# Patient Record
Sex: Male | Born: 1965 | Race: Black or African American | Hispanic: No
Health system: Southern US, Community
[De-identification: ages and names within clinical notes are randomized; demographics above are authoritative.]

## PROBLEM LIST (undated history)

## (undated) DIAGNOSIS — G40909 Epilepsy, unspecified, not intractable, without status epilepticus: Secondary | ICD-10-CM

## (undated) DIAGNOSIS — Z720 Tobacco use: Secondary | ICD-10-CM

## (undated) DIAGNOSIS — D649 Anemia, unspecified: Secondary | ICD-10-CM

## (undated) DIAGNOSIS — F1290 Cannabis use, unspecified, uncomplicated: Secondary | ICD-10-CM

## (undated) DIAGNOSIS — J449 Chronic obstructive pulmonary disease, unspecified: Secondary | ICD-10-CM

## (undated) DIAGNOSIS — F1010 Alcohol abuse, uncomplicated: Secondary | ICD-10-CM

## (undated) DIAGNOSIS — J45909 Unspecified asthma, uncomplicated: Secondary | ICD-10-CM

## (undated) DIAGNOSIS — I69351 Hemiplegia and hemiparesis following cerebral infarction affecting right dominant side: Secondary | ICD-10-CM

## (undated) DIAGNOSIS — G4730 Sleep apnea, unspecified: Secondary | ICD-10-CM

## (undated) DIAGNOSIS — I700 Atherosclerosis of aorta: Secondary | ICD-10-CM

## (undated) DIAGNOSIS — E785 Hyperlipidemia, unspecified: Secondary | ICD-10-CM

## (undated) DIAGNOSIS — I10 Essential (primary) hypertension: Secondary | ICD-10-CM

## (undated) DIAGNOSIS — K219 Gastro-esophageal reflux disease without esophagitis: Secondary | ICD-10-CM

## (undated) DIAGNOSIS — L732 Hidradenitis suppurativa: Secondary | ICD-10-CM

---

## 2005-10-31 ENCOUNTER — Emergency Department: Payer: Self-pay | Admitting: Emergency Medicine

## 2006-08-11 ENCOUNTER — Emergency Department: Payer: Self-pay | Admitting: Emergency Medicine

## 2008-11-13 ENCOUNTER — Emergency Department: Payer: Self-pay | Admitting: Emergency Medicine

## 2010-05-29 ENCOUNTER — Emergency Department: Payer: Self-pay | Admitting: Emergency Medicine

## 2010-05-29 IMAGING — US US PELVIS LIMITED
1 series · 17 of 25 positions shown · non-contrast
Comparison: none

REASON FOR EXAM: pain right testicle
COMMENTS:

[Series 1: us pelvis limited · 38 acquisitions, 17 frames shown]
[im 1/38]
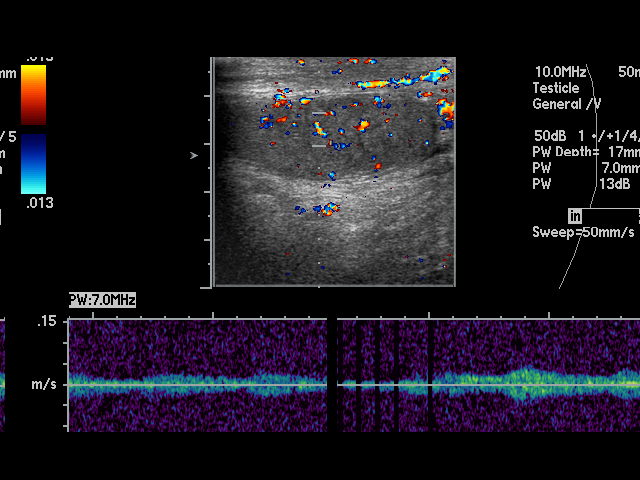
[im 4/38]
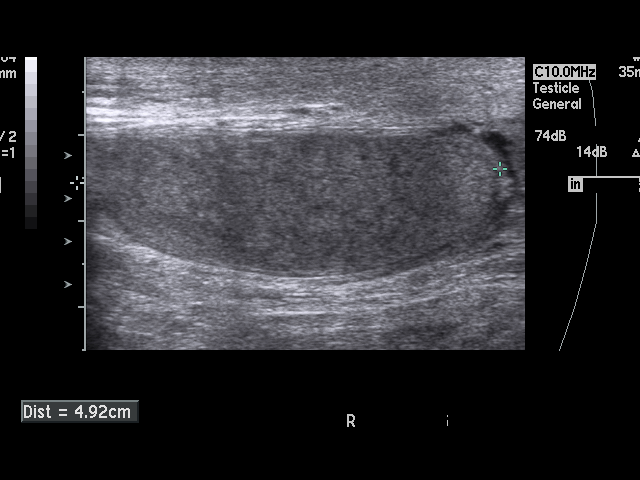
[im 5/38]
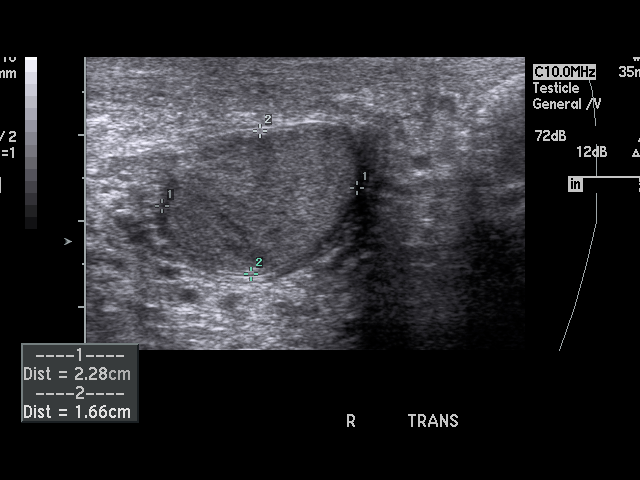
[im 8/38]
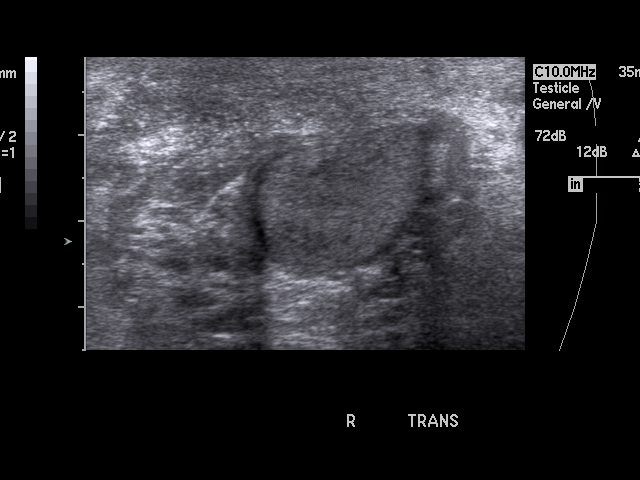
[im 10/38]
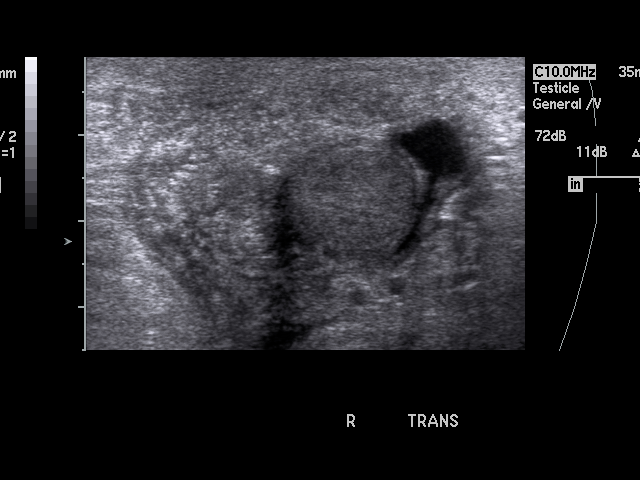
[im 13/38]
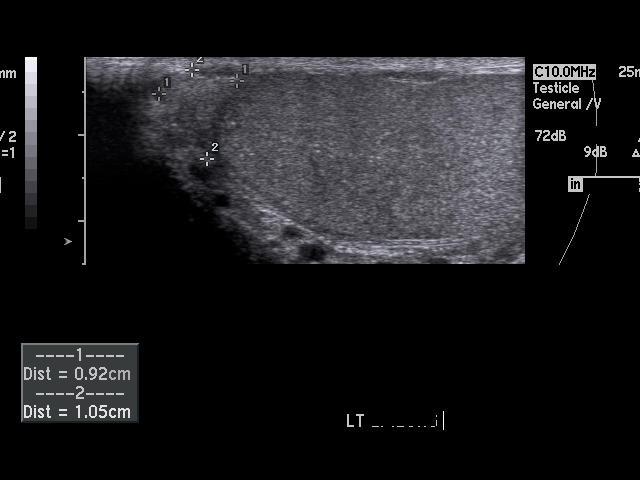
[im 14/38]
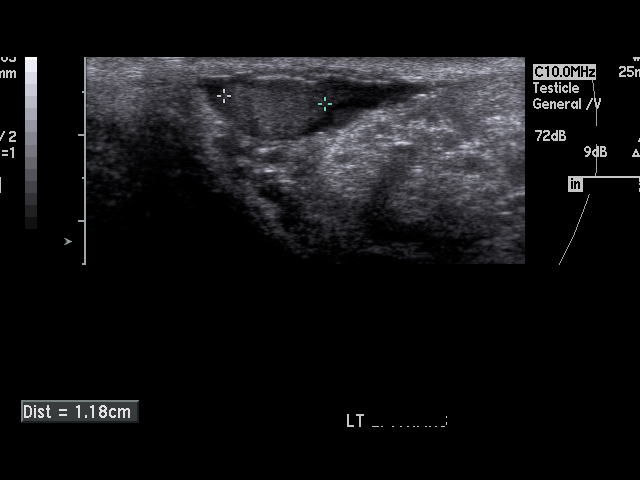
[im 17/38]
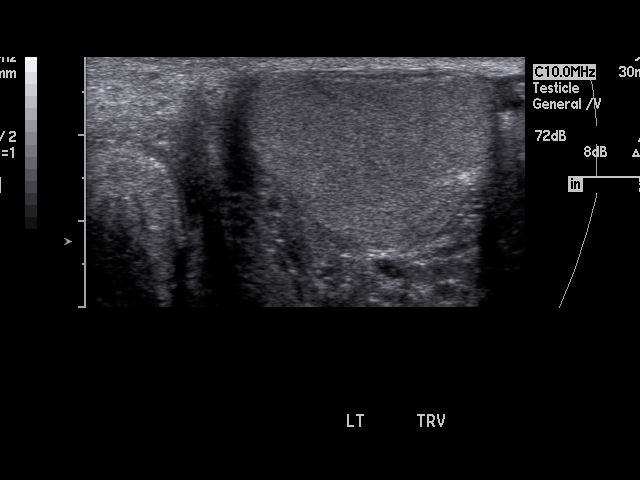
[im 19/38]
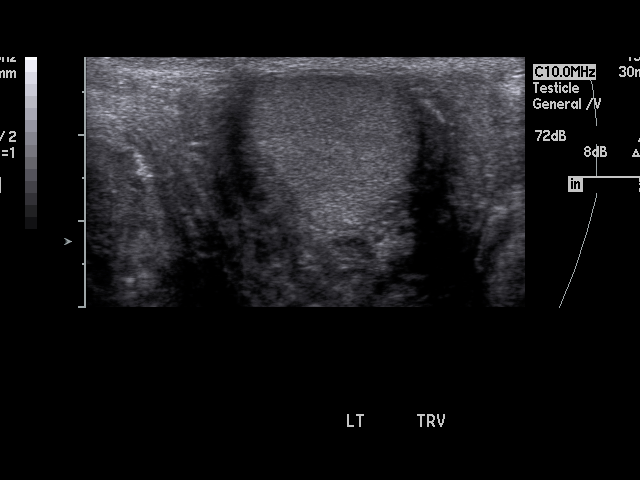
[im 21/38]
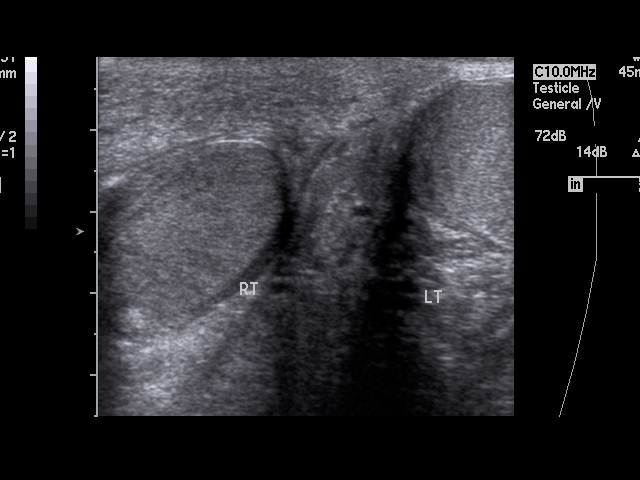
[im 24/38]
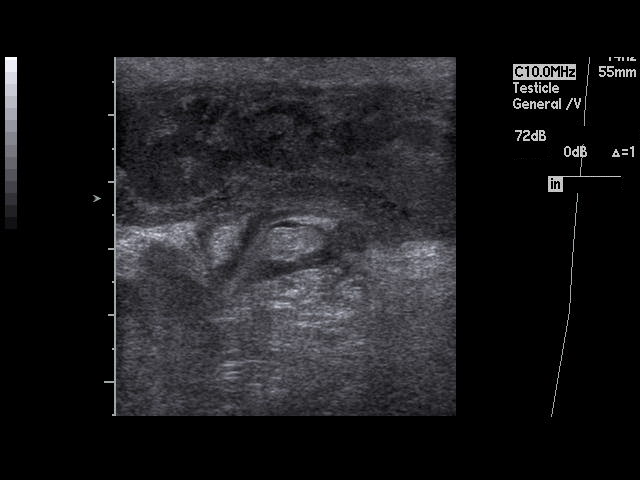
[im 25/38]
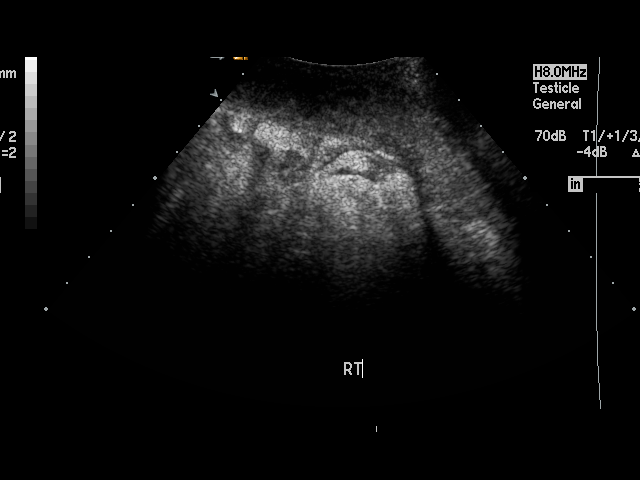
[im 28/38]
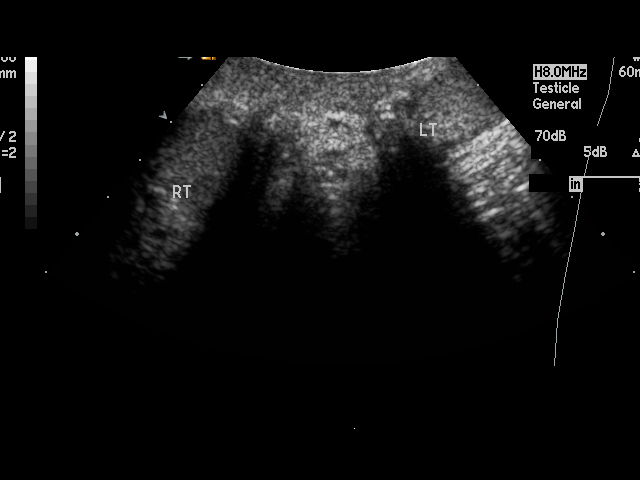
[im 30/38]
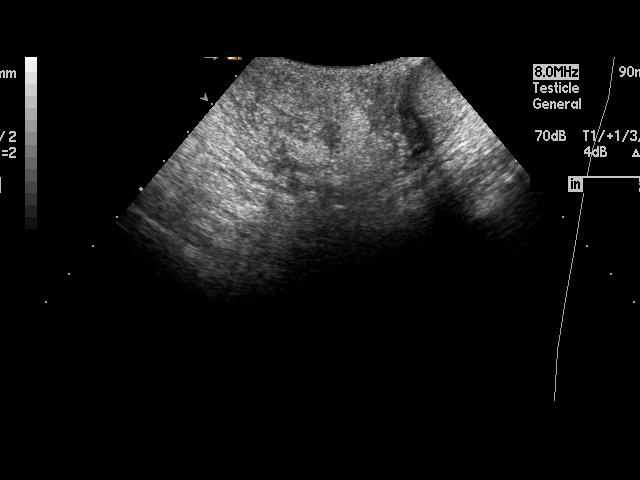
[im 33/38]
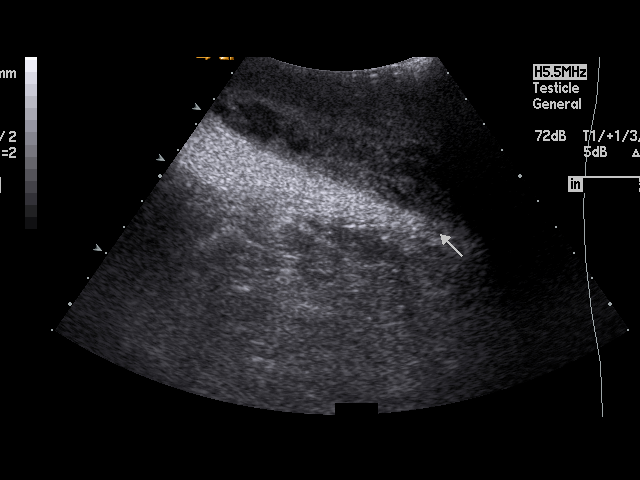
[im 34/38]
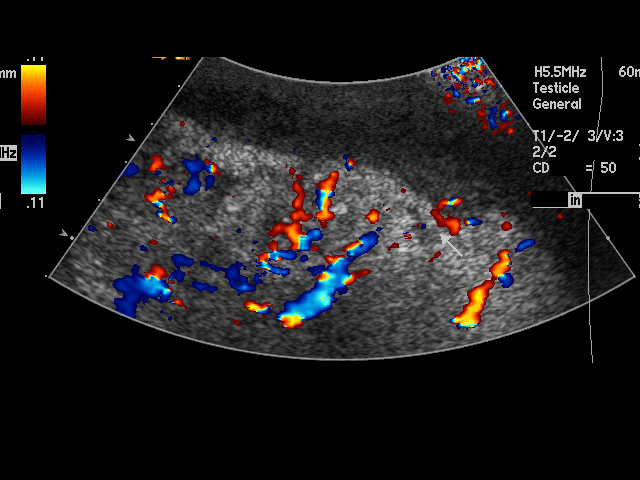
[im 38/38]
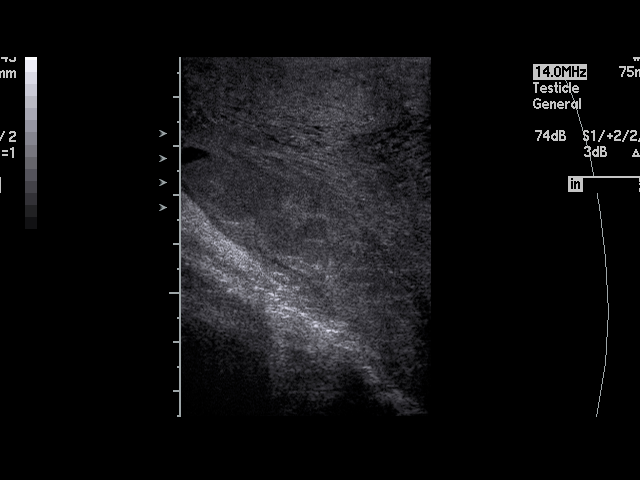

[17 of 25 positions shown; findings below may reference images not displayed]

PROCEDURE:     US  - US TESTICULAR  - [DATE]  [DATE]

RESULT:     The right testicle measures 4.9 cm x 2.2 cm x 1.6 cm and the
left testicle measures 4.8 cm x 2.9 cm x 3 cm. No intratesticular mass
lesions are seen. Doppler examination shows venous and arterial flow in each
testicle. There is no evidence for torsion. Posterior to the right testicle
there is an elongated, heterogeneous, hypoechoic area measuring up to 7 cm
in length and which appears to represent loculations of fluid containing
debris. The distribution does not seem typical for pyocele but pyocele
cannot be excluded. Hematoma is also a possibility but seems unlikely. The
finding does not appear to represent a solid mass. There is noted thickening
of the scrotal wall which usually indicates inflammation. The inguinal canal
is well seen on the right and no bowel extending through the canal into the
scrotum is identified.
IMPRESSION: 1.  No intratesticular mass lesions are seen.
2.  No torsion is identified.
3.  There is an apparent nonspecific fluid collection posterior to the right
scrotum and associated with scrotal edema.

## 2015-11-21 ENCOUNTER — Emergency Department
Admission: EM | Admit: 2015-11-21 | Discharge: 2015-11-21 | Disposition: A | Discharge status: Home / Self Care | Payer: Self-pay | Attending: Emergency Medicine | Admitting: Emergency Medicine

## 2015-11-21 ENCOUNTER — Emergency Department: Payer: Self-pay

## 2015-11-21 ENCOUNTER — Encounter: Payer: Self-pay | Admitting: *Deleted

## 2015-11-21 DIAGNOSIS — M170 Bilateral primary osteoarthritis of knee: Secondary | ICD-10-CM | POA: Insufficient documentation

## 2015-11-21 IMAGING — CR DG KNEE COMPLETE 4+V*L*
1 series · 4 of 4 positions shown · non-contrast
Comparison: None.

CLINICAL DATA: Left knee pain this morning.  Prior injuries.

EXAM:
LEFT KNEE - COMPLETE 4+ VIEW

[Series 1: ap · 0.17mm/px · 4 of 4 slices shown]
[im 1/4]
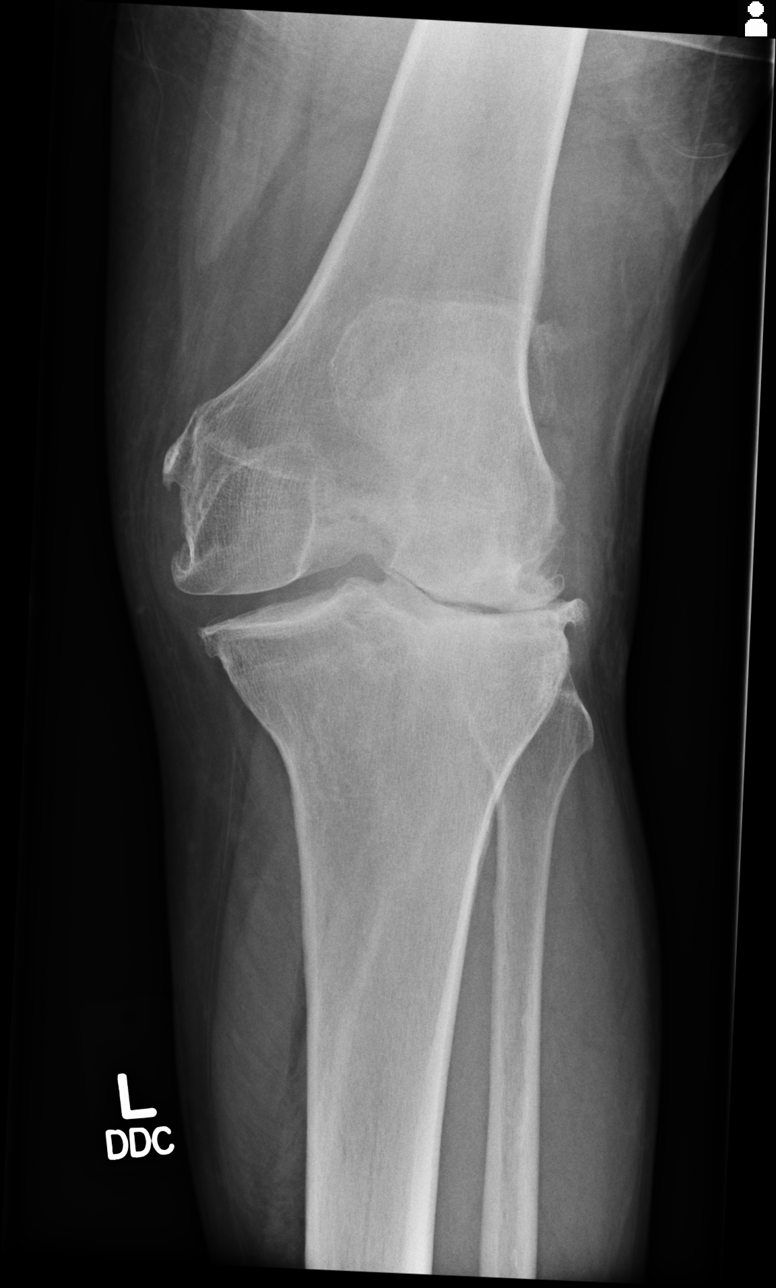
[im 2/4]
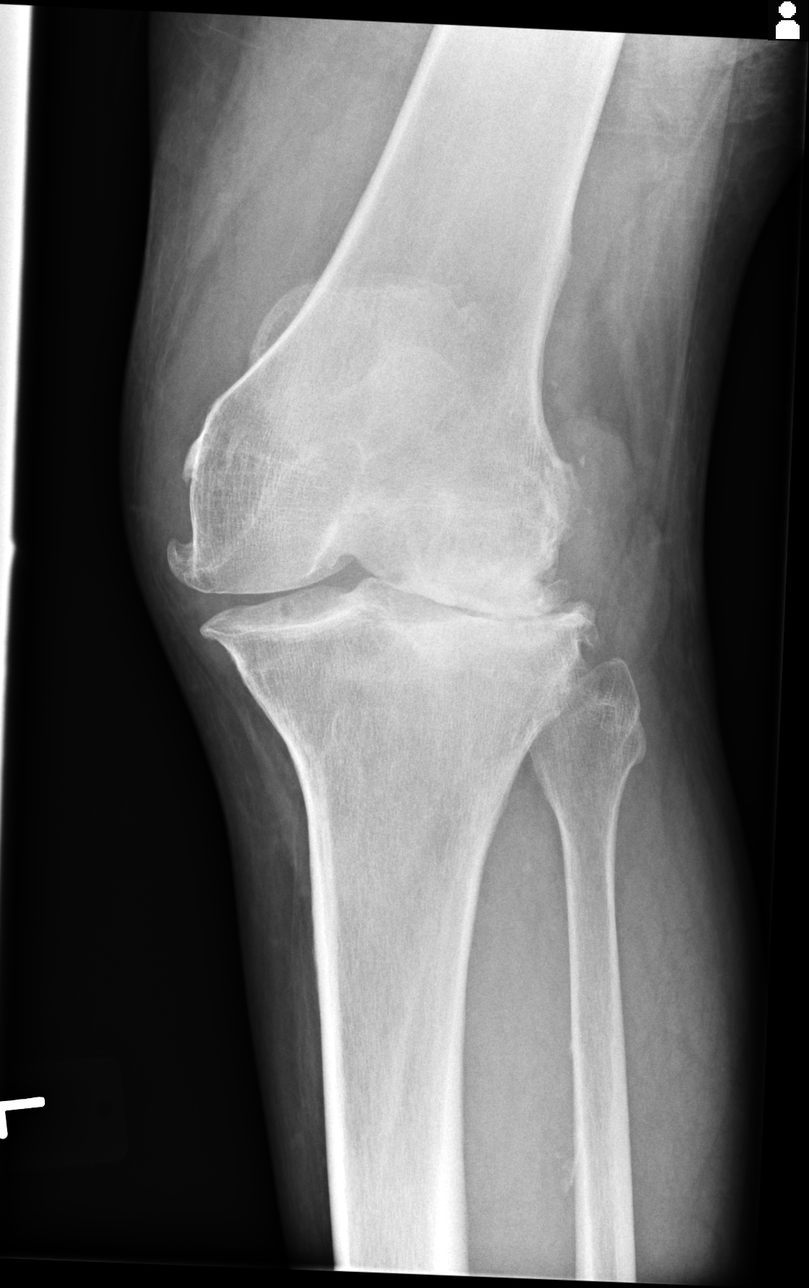
[im 3/4]
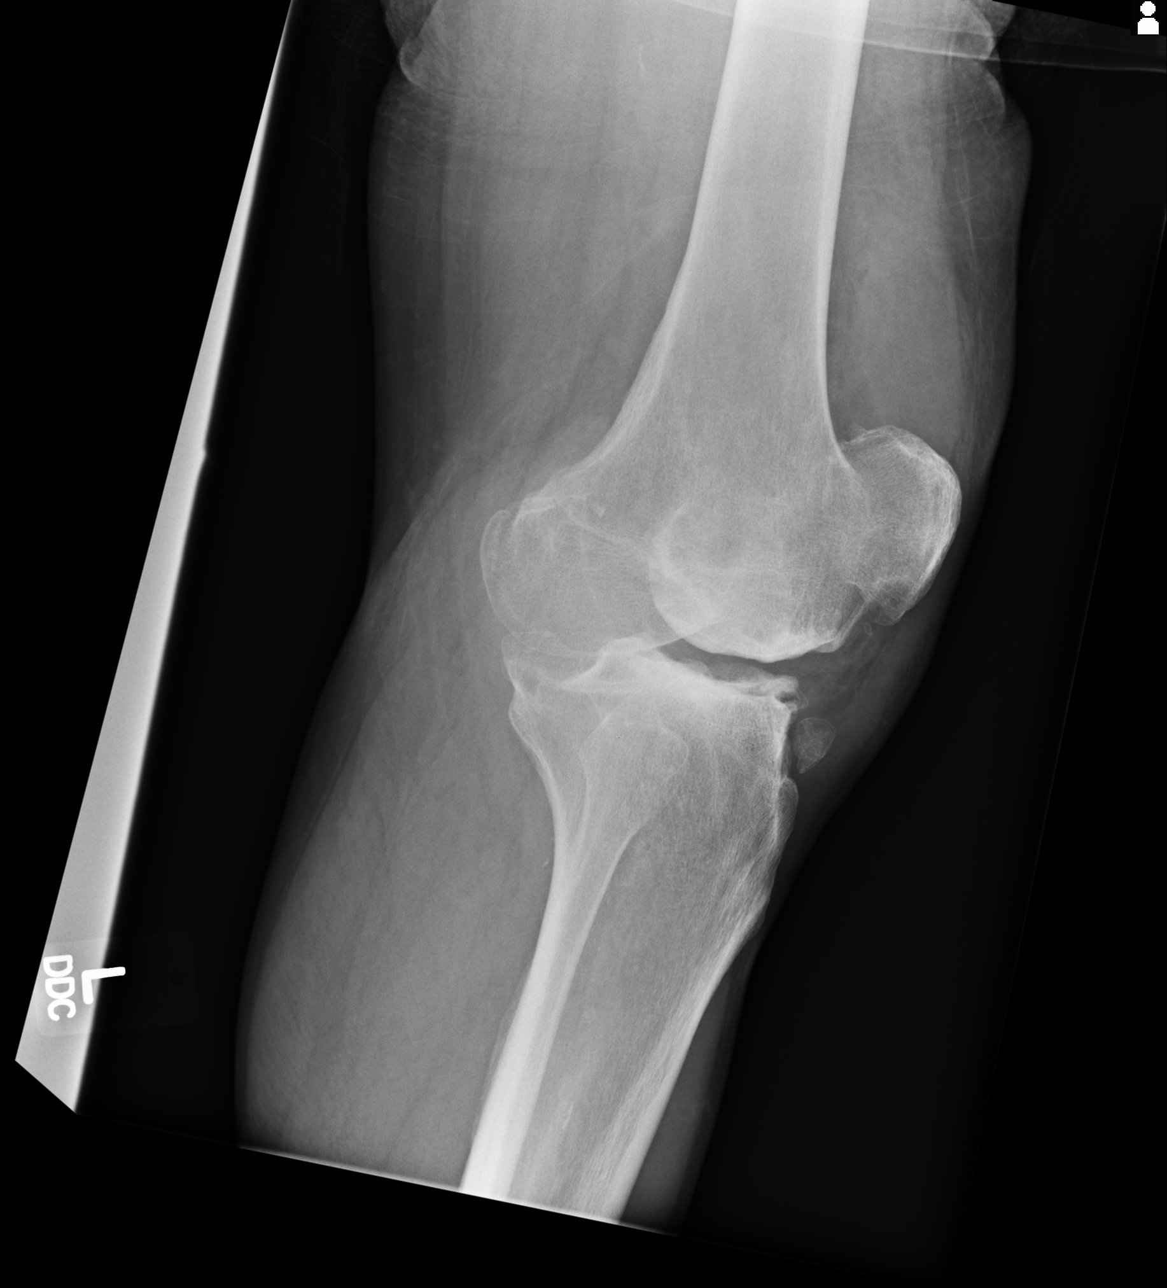
[im 4/4]
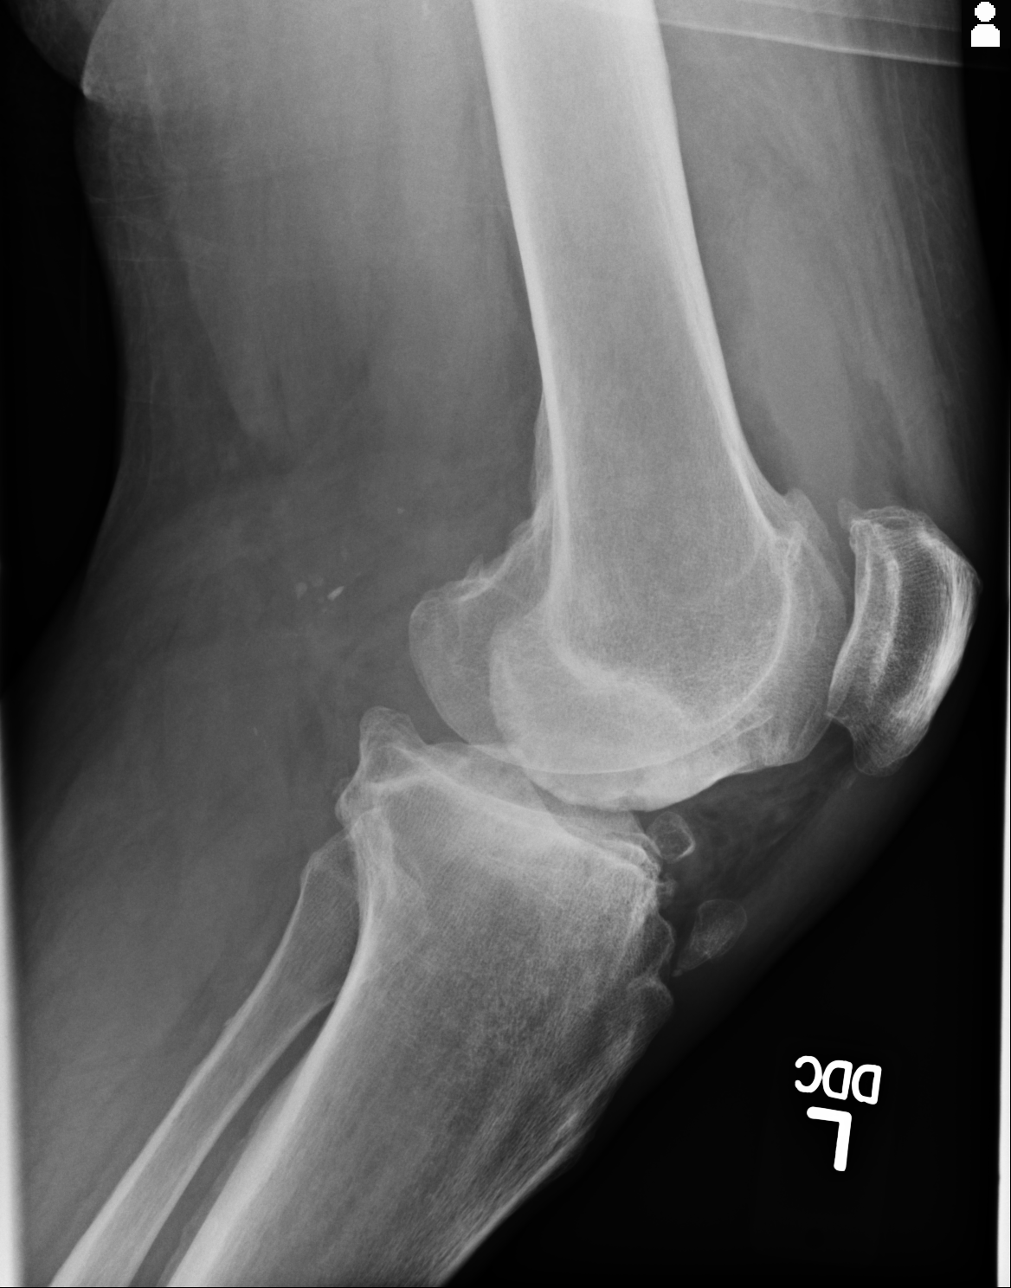

[4 of 4 positions shown; findings below may reference images not displayed]

FINDINGS: Severe osteoarthritis with full-thickness loss of articular
cartilage space in the medial compartment, superior spurring, and
subcortical eburnation.

Considerable lateral compartment marginal spurring. Moderate knee
effusion with patellofemoral spurring.

All ossific densities along the distal patellar tendon and possibly
along the anterior tibial spine, likely fragmented osteophytes.
There is edema in Hoffa's fat pad.
IMPRESSION: 1. Severe osteoarthritis.
2. Moderate knee effusion.
3. Well corticated ossific structures along the patellar tendon and
anterior tibial spine, potentially from prior remote
Osgood-Schlatter disease and/or fragmented osteophytes.

## 2015-11-21 MED ORDER — NAPROXEN 500 MG PO TABS
500.0000 mg | ORAL_TABLET | Freq: Once | ORAL | Status: AC
  Administered 2015-11-21: 500 mg via ORAL
  Filled 2015-11-21: qty 1

## 2015-11-21 MED ORDER — TRAMADOL HCL 50 MG PO TABS
50.0000 mg | ORAL_TABLET | Freq: Once | ORAL | Status: AC
  Administered 2015-11-21: 50 mg via ORAL
  Filled 2015-11-21: qty 1

## 2015-11-21 MED ORDER — MELOXICAM 15 MG PO TABS: 15.0000 mg | ORAL_TABLET | Freq: Every day | ORAL | Status: DC

## 2015-11-21 MED ORDER — TRAMADOL HCL 50 MG PO TABS: 50.0000 mg | ORAL_TABLET | Freq: Four times a day (QID) | ORAL | Status: AC | PRN

## 2015-11-21 NOTE — ED Provider Notes (Signed)
[facility] Emergency Department Provider Note  ____________________________________________  Time seen: Approximately 1:01 PM  I have reviewed the triage vital signs and the nursing notes.   HISTORY  Chief Complaint Knee Pain    HPI James Lambert is a 49 y.o. male left knee pain which has increased this morning. He stated 2 years ago he fell heavily on his left knee. Patient states the fall was so forceful that his left heel touch his left buttocks. He stated he denies significant medical care for this incident. Patient states been having intermitting left knee pain which worsens with cold weather for the past year and half. Patient states today he could barely bear weight with the left lower extremity secondary to knee pain. Patient is rating his pain as a 10 over 10 describe the pain as sharp. No palliative measures at this complaint.  History reviewed. No pertinent past medical history.  There are no active problems to display for this patient.   No past surgical history on file.  Current Outpatient Rx  Name  Route  Sig  Dispense  Refill  . meloxicam (MOBIC) 15 MG tablet   Oral   Take 1 tablet (15 mg total) by mouth daily.   30 tablet   0   . traMADol (ULTRAM) 50 MG tablet   Oral   Take 1 tablet (50 mg total) by mouth every 6 (six) hours as needed.   20 tablet   0     Allergies Review of patient's allergies indicates no known allergies.  History reviewed. No pertinent family history.  Social History Social History  Substance Use Topics  . Smoking status: None  . Smokeless tobacco: None  . Alcohol Use: None    Review of Systems Constitutional: No fever/chills Eyes: No visual changes. ENT: No sore throat. Cardiovascular: Denies chest pain. Respiratory: Denies shortness of breath. Gastrointestinal: No abdominal pain.  No nausea, no vomiting.  No diarrhea.  No constipation. Genitourinary: Negative for dysuria. Musculoskeletal:  Negative for back pain. Skin: Negative for rash. Neurological: Negative for headaches, focal weakness or numbness. 10-point ROS otherwise negative.  ____________________________________________   PHYSICAL EXAM:  VITAL SIGNS: ED Triage Vitals  Enc Vitals Group     BP 11/21/15 1238 167/118 mmHg     Pulse Rate 11/21/15 1238 72     Resp 11/21/15 1238 18     Temp 11/21/15 1238 97.8 F (36.6 C)     Temp Source 11/21/15 1238 Oral     SpO2 11/21/15 1238 97 %     Weight 11/21/15 1238 250 lb (113.399 kg)     Height 11/21/15 1238 6\' 2"  (1.88 m)     Head Cir --      Peak Flow --      Pain Score 11/21/15 1239 10     Pain Loc --      Pain Edu? --      Excl. in GC? --     Constitutional: Alert and oriented. Well appearing and in no acute distress. Eyes: Conjunctivae are normal. PERRL. EOMI. Head: Atraumatic. Nose: No congestion/rhinnorhea. Mouth/Throat: Mucous membranes are moist.  Oropharynx non-erythematous. Neck: No stridor. No cervical spine tenderness to palpation. Hematological/Lymphatic/Immunilogical: No cervical lymphadenopathy. Cardiovascular: Normal rate, regular rhythm. Grossly normal heart sounds.  Good peripheral circulation. Respiratory: Normal respiratory effort.  No retractions. Lungs CTAB. Gastrointestinal: Soft and nontender. No distention. No abdominal bruits. No CVA tenderness. Musculoskeletal: No lower extremity tenderness nor edema.  No joint effusions. Neurologic:  Normal speech and language. No gross focal neurologic deficits are appreciated. No gait instability. Skin:  Skin is warm, dry and intact. No rash noted. Psychiatric: Mood and affect are normal. Speech and behavior are normal.  ____________________________________________   LABS (all labs ordered are listed, but only abnormal results are displayed)  Labs Reviewed - No data to  display ____________________________________________  EKG   ____________________________________________  RADIOLOGY  X-rays show severe osteoarthritis of the left knee. There is moderate knee effusion. Also indication of proviousl remote trauma. I, Joni Reining, personally viewed and evaluated these images (plain radiographs) as part of my medical decision making.   ____________________________________________   PROCEDURES  Procedure(s) performed: None  Critical Care performed: No  ____________________________________________   INITIAL IMPRESSION / ASSESSMENT AND PLAN / ED COURSE  Pertinent labs & imaging results that were available during my care of the patient were reviewed by me and considered in my medical decision making (see chart for details).  Severe ARTHRITIS of the left knee. Moderate knee effusion. A chest x-ray findings with patient. Patient will follow-up with on-call orthopedic Doctor. Patient given a prescription for more and tramadol. Patient placed in a knee immobilizer and advised to continue using cane for support. ____________________________________________   FINAL CLINICAL IMPRESSION(S) / ED DIAGNOSES  Final diagnoses:  Osteoarthritis of both knees, unspecified osteoarthritis type       Joni Reining, PA-C 11/21/15 1429  Emily Filbert, MD 11/21/15 [phone]

## 2015-11-21 NOTE — ED Notes (Signed)
Pt taken to lobby via wheelchair. NAD noted at this time. Pt denies comments/concerns at this time.

## 2015-11-21 NOTE — Discharge Instructions (Signed)
How to Use a Knee Brace °A knee brace is a device that you wear to support your knee, especially if the knee is healing after an injury or surgery. There are several types of knee braces. Some are designed to prevent an injury (prophylactic brace). These are often worn during sports. Others support an injured knee (functional brace) or keep it still while it heals (rehabilitative brace). People with severe arthritis of the knee may benefit from a brace that takes some pressure off the knee (unloader brace). Most knee braces are made from a combination of cloth and metal or plastic.  °You may need to wear a knee brace to: °· Relieve knee pain. °· Help your knee support your weight (improve stability). °· Help you walk farther (improve mobility). °· Prevent injury. °· Support your knee while it heals from surgery or from an injury. °RISKS AND COMPLICATIONS °Generally, knee braces are very safe to wear. However, problems may occur, including: °· Skin irritation that may lead to infection. °· Making your condition worse if you wear the brace in the wrong way. °HOW TO USE A KNEE BRACE °Different braces will have different instructions for use. Your health care provider will tell you or show you: °· How to put on your brace. °· How to adjust the brace. °· When and how often to wear the brace. °· How to remove the brace. °· If you will need any assistive devices in addition to the brace, such as crutches or a cane. °In general, your brace should: °· Have the hinge of the brace line up with the bend of your knee. °· Have straps, hooks, or tapes that fasten snugly around your leg. °· Not feel too tight or too loose.  °HOW TO CARE FOR A KNEE BRACE °· Check your brace often for signs of damage, such as loose connections or attachments. Your knee brace may get damaged or wear out during normal use. °· Wash the fabric parts of your brace with soap and water. °· Read the insert that comes with your brace for other specific care  instructions.  °SEEK MEDICAL CARE IF: °· Your knee brace is too loose or too tight and you cannot adjust it. °· Your knee brace causes skin redness, swelling, bruising, or irritation. °· Your knee brace is not helping. °· Your knee brace is making your knee pain worse. °  °This information is not intended to replace advice given to you by your health care provider. Make sure you discuss any questions you have with your health care provider. °  °Document Released: 02/14/2004 Document Revised: 08/15/2015 Document Reviewed: 03/19/2015 °Elsevier Interactive Patient Education ©2016 [employer] ° °

## 2015-11-21 NOTE — ED Notes (Signed)
Pt states past injury of left knee but states this AM the pain was too significant that he could not walk

## 2016-03-04 ENCOUNTER — Encounter: Payer: Self-pay | Admitting: Medical Oncology

## 2016-03-04 ENCOUNTER — Emergency Department
Admission: EM | Admit: 2016-03-04 | Discharge: 2016-03-04 | Disposition: A | Discharge status: Home / Self Care | Payer: Self-pay | Attending: Emergency Medicine | Admitting: Emergency Medicine

## 2016-03-04 DIAGNOSIS — Z791 Long term (current) use of non-steroidal anti-inflammatories (NSAID): Secondary | ICD-10-CM | POA: Insufficient documentation

## 2016-03-04 DIAGNOSIS — L0231 Cutaneous abscess of buttock: Secondary | ICD-10-CM | POA: Insufficient documentation

## 2016-03-04 DIAGNOSIS — I10 Essential (primary) hypertension: Secondary | ICD-10-CM | POA: Insufficient documentation

## 2016-03-04 DIAGNOSIS — F17200 Nicotine dependence, unspecified, uncomplicated: Secondary | ICD-10-CM | POA: Insufficient documentation

## 2016-03-04 HISTORY — DX: Essential (primary) hypertension: I10

## 2016-03-04 MED ORDER — SULFAMETHOXAZOLE-TRIMETHOPRIM 800-160 MG PO TABS: 1.0000 | ORAL_TABLET | Freq: Two times a day (BID) | ORAL | Status: DC

## 2016-03-04 MED ORDER — CLONIDINE HCL 0.1 MG PO TABS
0.1000 mg | ORAL_TABLET | Freq: Once | ORAL | Status: AC
  Administered 2016-03-04: 0.1 mg via ORAL
  Filled 2016-03-04: qty 1

## 2016-03-04 MED ORDER — AMLODIPINE BESYLATE 5 MG PO TABS: 5.0000 mg | ORAL_TABLET | Freq: Every day | ORAL | Status: DC

## 2016-03-04 MED ORDER — VERAPAMIL HCL 80 MG PO TABS: 80.0000 mg | ORAL_TABLET | Freq: Three times a day (TID) | ORAL | Status: DC

## 2016-03-04 MED ORDER — BUTALBITAL-APAP-CAFFEINE 50-325-40 MG PO TABS: 1.0000 | ORAL_TABLET | Freq: Four times a day (QID) | ORAL | Status: AC | PRN

## 2016-03-04 MED ORDER — BUTALBITAL-APAP-CAFFEINE 50-325-40 MG PO TABS: 1.0000 | ORAL_TABLET | Freq: Four times a day (QID) | ORAL | Status: DC | PRN

## 2016-03-04 NOTE — ED Notes (Signed)
Pt reports he has had a headache x 2 weeks, yesterday he checked his bp and it was elevated. Pt reports that he used to take BP meds but took himself off of them.

## 2016-03-04 NOTE — ED Notes (Signed)
See triage note   States he has had headache for about 2 weeks.. Noticed that b/p was elevated stopped taking b/p meds about 10 years ago b/c of cost. Also has 2 possible abscess areas  Which are draining. 1 to back and 1 to left leg

## 2016-03-04 NOTE — ED Provider Notes (Signed)
[facility] Emergency Department Provider Note  ____________________________________________  Time seen: Approximately 10:04 AM  I have reviewed the triage vital signs and the nursing notes.   HISTORY  Chief Complaint Headache and Hypertension    HPI James Lambert is a 50 y.o. male presents for evaluation of elevated blood pressure and states that he had a cutaneous abscess located in his buttocks. Patient states that the abscess and there on and off for years and continues to drain. Patient reports he used to be on blood pressure medications but took some self off of them secondary to cost. Desires antibiotics to help with the abscess. States he does not have a doctor at this time or any money to see one.   Past Medical History  Diagnosis Date  . Hypertension     There are no active problems to display for this patient.   History reviewed. No pertinent past surgical history.  Current Outpatient Rx  Name  Route  Sig  Dispense  Refill  . butalbital-acetaminophen-caffeine (FIORICET) 50-325-40 MG tablet   Oral   Take 1-2 tablets by mouth every 6 (six) hours as needed for headache.   20 tablet   0   . meloxicam (MOBIC) 15 MG tablet   Oral   Take 1 tablet (15 mg total) by mouth daily.   30 tablet   0   . sulfamethoxazole-trimethoprim (BACTRIM DS,SEPTRA DS) 800-160 MG tablet   Oral   Take 1 tablet by mouth 2 (two) times daily.   20 tablet   0   . traMADol (ULTRAM) 50 MG tablet   Oral   Take 1 tablet (50 mg total) by mouth every 6 (six) hours as needed.   20 tablet   0   . verapamil (CALAN) 80 MG tablet   Oral   Take 1 tablet (80 mg total) by mouth 3 (three) times daily.   90 tablet   0     Allergies Review of patient's allergies indicates no known allergies.  No family history on file.  Social History Social History  Substance Use Topics  . Smoking status: Current Every Day Smoker  . Smokeless tobacco: None  . Alcohol Use:  Yes     Comment: daily    Review of Systems Constitutional: No fever/chills Eyes: No visual changes. ENT: No sore throat. Cardiovascular: Denies chest pain. Respiratory: Denies shortness of breath. Gastrointestinal: No abdominal pain.  No nausea, no vomiting.  No diarrhea.  No constipation. Genitourinary: Negative for dysuria. Musculoskeletal: Negative for back pain. Skin: Positive for abscess to upper buttocks Neurological: Positive for headaches, negative for focal weakness or numbness.  10-point ROS otherwise negative.  ____________________________________________   PHYSICAL EXAM:  VITAL SIGNS: ED Triage Vitals  Enc Vitals Group     BP 03/04/16 0856 202/131 mmHg     Pulse Rate 03/04/16 0856 73     Resp 03/04/16 0856 18     Temp 03/04/16 0856 98.1 F (36.7 C)     Temp Source 03/04/16 0856 Oral     SpO2 03/04/16 0856 96 %     Weight 03/04/16 0856 235 lb (106.595 kg)     Height 03/04/16 0856  (1.88 m)     Head Cir --      Peak Flow --      Pain Score 03/04/16 0856 5     Pain Loc --      Pain Edu? --      Excl. in GC? --  Constitutional: Alert and oriented. Well appearing and in no acute distress. Eyes: Conjunctivae are normal. PERRL. EOMI. no hypertensive retinopathy noted. Head: Atraumatic. Nose: No congestion/rhinnorhea. Mouth/Throat: Mucous membranes are moist.  Oropharynx non-erythematous. Neck: No stridor.   Cardiovascular: Normal rate, regular rhythm. Grossly normal heart sounds.  Good peripheral circulation. Respiratory: Normal respiratory effort.  No retractions. Lungs CTAB. Musculoskeletal: No lower extremity tenderness nor edema.  No joint effusions. Neurologic:  Normal speech and language. No gross focal neurologic deficits are appreciated. No gait instability. Skin:  Skin is warm, dry and intact. No rash noted. Buttocks with obvious draining abscess at the cutaneous cleft. One by 1 cm. No erythema or edema noted. Nontender to  palpation. Psychiatric: Mood and affect are normal. Speech and behavior are normal.  ____________________________________________   LABS (all labs ordered are listed, but only abnormal results are displayed)  Labs Reviewed - No data to display   PROCEDURES  Procedure(s) performed: None  Critical Care performed: No  ____________________________________________   INITIAL IMPRESSION / ASSESSMENT AND PLAN / ED COURSE  Pertinent labs & imaging results that were available during my care of the patient were reviewed by me and considered in my medical decision making (see chart for details).  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 Rx given for verapamil 80 mg 3 times a day. Rx given for Bactrim DS twice a day #20. Patient follow-up with PCP list of several providers given. Patient was given clonidine 0.1 mg on the ED with good improvement of vital signs dropped his blood pressure from 202/131 down to 181/118. Patient denies any headache at the time of discharge. ____________________________________________   FINAL CLINICAL IMPRESSION(S) / ED DIAGNOSES  Final diagnoses:  Essential hypertension  Cutaneous abscess of buttock     Evangeline Dakinharles M Beers, PA-C 03/04/16 1704  Evangeline Dakinharles M Beers, PA-C 03/04/16 1704  Emily FilbertJonathan E Williams, MD 03/05/16 [id number]

## 2016-03-04 NOTE — ED Notes (Signed)
Spoke with Dr Mayford Knifewilliams about patient and was told that pt was not to have lab work or ekg. Pt to be seen in flex care area.

## 2016-03-04 NOTE — Discharge Instructions (Signed)
Abscess An abscess is an infected area that contains a collection of pus and debris.It can occur in almost any part of the body. An abscess is also known as a furuncle or boil. CAUSES  An abscess occurs when tissue gets infected. This can occur from blockage of oil or sweat glands, infection of hair follicles, or a minor injury to the skin. As the body tries to fight the infection, pus collects in the area and creates pressure under the skin. This pressure causes pain. People with weakened immune systems have difficulty fighting infections and get certain abscesses more often.  SYMPTOMS Usually an abscess develops on the skin and becomes a painful mass that is red, warm, and tender. If the abscess forms under the skin, you may feel a moveable soft area under the skin. Some abscesses break open (rupture) on their own, but most will continue to get worse without care. The infection can spread deeper into the body and eventually into the bloodstream, causing you to feel ill.  DIAGNOSIS  Your caregiver will take your medical history and perform a physical exam. A sample of fluid may also be taken from the abscess to determine what is causing your infection. TREATMENT  Your caregiver may prescribe antibiotic medicines to fight the infection. However, taking antibiotics alone usually does not cure an abscess. Your caregiver may need to make a small cut (incision) in the abscess to drain the pus. In some cases, gauze is packed into the abscess to reduce pain and to continue draining the area. HOME CARE INSTRUCTIONS   Only take over-the-counter or prescription medicines for pain, discomfort, or fever as directed by your caregiver.  If you were prescribed antibiotics, take them as directed. Finish them even if you start to feel better.  If gauze is used, follow your caregiver's directions for changing the gauze.  To avoid spreading the infection:  Keep your draining abscess covered with a  bandage.  Wash your hands well.  Do not share personal care items, towels, or whirlpools with others.  Avoid skin contact with others.  Keep your skin and clothes clean around the abscess.  Keep all follow-up appointments as directed by your caregiver. SEEK MEDICAL CARE IF:   You have increased pain, swelling, redness, fluid drainage, or bleeding.  You have muscle aches, chills, or a general ill feeling.  You have a fever. MAKE SURE YOU:   Understand these instructions.  Will watch your condition.  Will get help right away if you are not doing well or get worse.   This information is not intended to replace advice given to you by your health care provider. Make sure you discuss any questions you have with your health care provider.   Document Released: 09/03/2005 Document Revised: 05/25/2012 Document Reviewed: 02/06/2012 Elsevier Interactive Patient Education 2016 [employer]  Hypertension Hypertension, commonly called high blood pressure, is when the force of blood pumping through your arteries is too strong. Your arteries are the blood vessels that carry blood from your heart throughout your body. A blood pressure reading consists of a higher number over a lower number, such as 110/72. The higher number (systolic) is the pressure inside your arteries when your heart pumps. The lower number (diastolic) is the pressure inside your arteries when your heart relaxes. Ideally you want your blood pressure below 120/80. Hypertension forces your heart to work harder to pump blood. Your arteries may become narrow or stiff. Having untreated or uncontrolled hypertension can cause heart attack, stroke,  kidney disease, and other problems. RISK FACTORS Some risk factors for high blood pressure are controllable. Others are not.  Risk factors you cannot control include:   Race. You may be at higher risk if you are African American.  Age. Risk increases with age.  Gender. Men are at  higher risk than women before age 62 years. After age 68, women are at higher risk than men. Risk factors you can control include:  Not getting enough exercise or physical activity.  Being overweight.  Getting too much fat, sugar, calories, or salt in your diet.  Drinking too much alcohol. SIGNS AND SYMPTOMS Hypertension does not usually cause signs or symptoms. Extremely high blood pressure (hypertensive crisis) may cause headache, anxiety, shortness of breath, and nosebleed. DIAGNOSIS To check if you have hypertension, your health care provider will measure your blood pressure while you are seated, with your arm held at the level of your heart. It should be measured at least twice using the same arm. Certain conditions can cause a difference in blood pressure between your right and left arms. A blood pressure reading that is higher than normal on one occasion does not mean that you need treatment. If it is not clear whether you have high blood pressure, you may be asked to return on a different day to have your blood pressure checked again. Or, you may be asked to monitor your blood pressure at home for 1 or more weeks. TREATMENT Treating high blood pressure includes making lifestyle changes and possibly taking medicine. Living a healthy lifestyle can help lower high blood pressure. You may need to change some of your habits. Lifestyle changes may include:  Following the DASH diet. This diet is high in fruits, vegetables, and whole grains. It is low in salt, red meat, and added sugars.  Keep your sodium intake below 2,300 mg per day.  Getting at least 30-45 minutes of aerobic exercise at least 4 times per week.  Losing weight if necessary.  Not smoking.  Limiting alcoholic beverages.  Learning ways to reduce stress. Your health care provider may prescribe medicine if lifestyle changes are not enough to get your blood pressure under control, and if one of the following is true:  You  are 55-40 years of age and your systolic blood pressure is above 140.  You are 32 years of age or older, and your systolic blood pressure is above 150.  Your diastolic blood pressure is above 90.  You have diabetes, and your systolic blood pressure is over 140 or your diastolic blood pressure is over 90.  You have kidney disease and your blood pressure is above 140/90.  You have heart disease and your blood pressure is above 140/90. Your personal target blood pressure may vary depending on your medical conditions, your age, and other factors. HOME CARE INSTRUCTIONS  Have your blood pressure rechecked as directed by your health care provider.   Take medicines only as directed by your health care provider. Follow the directions carefully. Blood pressure medicines must be taken as prescribed. The medicine does not work as well when you skip doses. Skipping doses also puts you at risk for problems.  Do not smoke.   Monitor your blood pressure at home as directed by your health care provider. SEEK MEDICAL CARE IF:   You think you are having a reaction to medicines taken.  You have recurrent headaches or feel dizzy.  You have swelling in your ankles.  You have trouble with your  vision. SEEK IMMEDIATE MEDICAL CARE IF:  You develop a severe headache or confusion.  You have unusual weakness, numbness, or feel faint.  You have severe chest or abdominal pain.  You vomit repeatedly.  You have trouble breathing. MAKE SURE YOU:   Understand these instructions.  Will watch your condition.  Will get help right away if you are not doing well or get worse.   This information is not intended to replace advice given to you by your health care provider. Make sure you discuss any questions you have with your health care provider.   Document Released: 11/24/2005 Document Revised: 04/10/2015 Document Reviewed: 09/16/2013 Elsevier Interactive Patient Education 2016 [employer]  Inc[employer]  Managing Your High Blood Pressure Blood pressure is a measurement of how forceful your blood is pressing against the walls of the arteries. Arteries are muscular tubes within the circulatory system. Blood pressure does not stay the same. Blood pressure rises when you are active, excited, or nervous; and it lowers during sleep and relaxation. If the numbers measuring your blood pressure stay above normal most of the time, you are at risk for health problems. High blood pressure (hypertension) is a long-term (chronic) condition in which blood pressure is elevated. A blood pressure reading is recorded as two numbers, such as 120 over 80 (or 120/80). The first, higher number is called the systolic pressure. It is a measure of the pressure in your arteries as the heart beats. The second, lower number is called the diastolic pressure. It is a measure of the pressure in your arteries as the heart relaxes between beats.  Keeping your blood pressure in a normal range is important to your overall health and prevention of health problems, such as heart disease and stroke. When your blood pressure is uncontrolled, your heart has to work harder than normal. High blood pressure is a very common condition in adults because blood pressure tends to rise with age. Men and women are equally likely to have hypertension but at different times in life. Before age 70, men are more likely to have hypertension. After 49 years of age, women are more likely to have it. Hypertension is especially common in African Americans. This condition often has no signs or symptoms. The cause of the condition is usually not known. Your caregiver can help you come up with a plan to keep your blood pressure in a normal, healthy range. BLOOD PRESSURE STAGES Blood pressure is classified into four stages: normal, prehypertension, stage 1, and stage 2. Your blood pressure reading will be used to determine what type of treatment, if any, is  necessary. Appropriate treatment options are tied to these four stages:  Normal  Systolic pressure (mm Hg): below 120.  Diastolic pressure (mm Hg): below 80. Prehypertension  Systolic pressure (mm Hg): 120 to 139.  Diastolic pressure (mm Hg): 80 to 89. Stage1  Systolic pressure (mm Hg): 140 to 159.  Diastolic pressure (mm Hg): 90 to 99. Stage2  Systolic pressure (mm Hg): 160 or above.  Diastolic pressure (mm Hg): 100 or above. RISKS RELATED TO HIGH BLOOD PRESSURE Managing your blood pressure is an important responsibility. Uncontrolled high blood pressure can lead to:  A heart attack.  A stroke.  A weakened blood vessel (aneurysm).  Heart failure.  Kidney damage.  Eye damage.  Metabolic syndrome.  Memory and concentration problems. HOW TO MANAGE YOUR BLOOD PRESSURE Blood pressure can be managed effectively with lifestyle changes and medicines (if needed). Your caregiver will help you come  up with a plan to bring your blood pressure within a normal range. Your plan should include the following: Education  Read all information provided by your caregivers about how to control blood pressure.  Educate yourself on the latest guidelines and treatment recommendations. New research is always being done to further define the risks and treatments for high blood pressure. Lifestylechanges  Control your weight.  Avoid smoking.  Stay physically active.  Reduce the amount of salt in your diet.  Reduce stress.  Control any chronic conditions, such as high cholesterol or diabetes.  Reduce your alcohol intake. Medicines  Several medicines (antihypertensive medicines) are available, if needed, to bring blood pressure within a normal range. Communication  Review all the medicines you take with your caregiver because there may be side effects or interactions.  Talk with your caregiver about your diet, exercise habits, and other lifestyle factors that may be  contributing to high blood pressure.  See your caregiver regularly. Your caregiver can help you create and adjust your plan for managing high blood pressure. RECOMMENDATIONS FOR TREATMENT AND FOLLOW-UP  The following recommendations are based on current guidelines for managing high blood pressure in nonpregnant adults. Use these recommendations to identify the proper follow-up period or treatment option based on your blood pressure reading. You can discuss these options with your caregiver.  Systolic pressure of 120 to 139 or diastolic pressure of 80 to 89: Follow up with your caregiver as directed.  Systolic pressure of 140 to 160 or diastolic pressure of 90 to 100: Follow up with your caregiver within 2 months.  Systolic pressure above 160 or diastolic pressure above 100: Follow up with your caregiver within 1 month.  Systolic pressure above 180 or diastolic pressure above 110: Consider antihypertensive therapy; follow up with your caregiver within 1 week.  Systolic pressure above 200 or diastolic pressure above 120: Begin antihypertensive therapy; follow up with your caregiver within 1 week.   This information is not intended to replace advice given to you by your health care provider. Make sure you discuss any questions you have with your health care provider.   Document Released: 08/18/2012 Document Reviewed: 08/18/2012 Elsevier Interactive Patient Education [employer]

## 2016-08-07 ENCOUNTER — Encounter: Payer: Self-pay | Admitting: Emergency Medicine

## 2016-08-07 ENCOUNTER — Emergency Department
Admission: EM | Admit: 2016-08-07 | Discharge: 2016-08-07 | Disposition: A | Discharge status: Home / Self Care | Payer: Self-pay | Attending: Emergency Medicine | Admitting: Emergency Medicine

## 2016-08-07 DIAGNOSIS — F17210 Nicotine dependence, cigarettes, uncomplicated: Secondary | ICD-10-CM | POA: Insufficient documentation

## 2016-08-07 DIAGNOSIS — L0501 Pilonidal cyst with abscess: Secondary | ICD-10-CM

## 2016-08-07 DIAGNOSIS — L0231 Cutaneous abscess of buttock: Secondary | ICD-10-CM

## 2016-08-07 DIAGNOSIS — I10 Essential (primary) hypertension: Secondary | ICD-10-CM | POA: Insufficient documentation

## 2016-08-07 DIAGNOSIS — L0502 Pilonidal sinus with abscess: Secondary | ICD-10-CM | POA: Insufficient documentation

## 2016-08-07 MED ORDER — SULFAMETHOXAZOLE-TRIMETHOPRIM 800-160 MG PO TABS: 1.0000 | ORAL_TABLET | Freq: Two times a day (BID) | ORAL | 0 refills | Status: DC

## 2016-08-07 MED ORDER — TRAMADOL HCL 50 MG PO TABS: 50.0000 mg | ORAL_TABLET | Freq: Four times a day (QID) | ORAL | 0 refills | Status: AC | PRN

## 2016-08-07 NOTE — ED Notes (Signed)
See triage note  States he developed some abscess areas to back and groin area about 5 days ago. Hx of same ..Marland Kitchen

## 2016-08-07 NOTE — ED Triage Notes (Signed)
Pt to ed with c/o abscess to back and groin area.  Pt reports they are both draining for last 5 days.

## 2016-08-07 NOTE — ED Provider Notes (Signed)
[facility] Emergency Department Provider Note   ____________________________________________   None    (approximate)  I have reviewed the triage vital signs and the nursing notes.   HISTORY  Chief Complaint Abscess    HPI James Lambert is a 50 y.o. male patient complain of 2 abscesses 1 on his back and the other one radiating from his groin area. Patient stated both for draining for the last 5 days. Patient denies any fever associated with this complaint. Patient stated areas are very painful. No palliative measures for this complaint.   Past Medical History:  Diagnosis Date  . Hypertension     There are no active problems to display for this patient.   History reviewed. No pertinent surgical history.  Prior to Admission medications   Medication Sig Start Date End Date Taking? Authorizing Provider  butalbital-acetaminophen-caffeine (FIORICET) 845753059350-325-40 MG tablet Take 1-2 tablets by mouth every 6 (six) hours as needed for headache. 03/04/16 03/04/17  Evangeline Dakinharles M Beers, PA-C  meloxicam (MOBIC) 15 MG tablet Take 1 tablet (15 mg total) by mouth daily. 11/21/15   Joni Reiningonald K Smith, PA-C  sulfamethoxazole-trimethoprim (BACTRIM DS,SEPTRA DS) 800-160 MG tablet Take 1 tablet by mouth 2 (two) times daily. 03/04/16   Charmayne Sheerharles M Beers, PA-C  sulfamethoxazole-trimethoprim (BACTRIM DS,SEPTRA DS) 800-160 MG tablet Take 1 tablet by mouth 2 (two) times daily. 08/07/16   Joni Reiningonald K Smith, PA-C  traMADol (ULTRAM) 50 MG tablet Take 1 tablet (50 mg total) by mouth every 6 (six) hours as needed. 11/21/15 11/20/16  Joni Reiningonald K Smith, PA-C  traMADol (ULTRAM) 50 MG tablet Take 1 tablet (50 mg total) by mouth every 6 (six) hours as needed. 08/07/16 08/07/17  Joni Reiningonald K Smith, PA-C  verapamil (CALAN) 80 MG tablet Take 1 tablet (80 mg total) by mouth 3 (three) times daily. 03/04/16   Evangeline Dakinharles M Beers, PA-C    Allergies Review of patient's allergies indicates no known allergies.  History  reviewed. No pertinent family history.  Social History Social History  Substance Use Topics  . Smoking status: Current Every Day Smoker    Types: Cigarettes  . Smokeless tobacco: Never Used  . Alcohol use Yes     Comment: daily    Review of Systems Constitutional: No fever/chills Eyes: No visual changes. ENT: No sore throat. Cardiovascular: Denies chest pain. Respiratory: Denies shortness of breath. Gastrointestinal: No abdominal pain.  No nausea, no vomiting.  No diarrhea.  No constipation. Genitourinary: Negative for dysuria. Musculoskeletal: Negative for back pain. Skin:Positive for abscesses palpable male and right buttocks. Neurological: Negative for headaches, focal weakness or numbness.    ____________________________________________   PHYSICAL EXAM:  VITAL SIGNS: ED Triage Vitals  Enc Vitals Group     BP 08/07/16 1140 (!) 159/73     Pulse Rate 08/07/16 1140 99     Resp 08/07/16 1140 18     Temp 08/07/16 1140 98 F (36.7 C)     Temp src --      SpO2 08/07/16 1140 99 %     Weight 08/07/16 1141 260 lb (117.9 kg)     Height 08/07/16 1141 6\' 2"  (1.88 m)     Head Circumference --      Peak Flow --      Pain Score 08/07/16 1142 8     Pain Loc --      Pain Edu? --      Excl. in GC? --     Constitutional: Alert and oriented. Well appearing  and in no acute distress. Eyes: Conjunctivae are normal. PERRL. EOMI. Head: Atraumatic. Nose: No congestion/rhinnorhea. Mouth/Throat: Mucous membranes are moist.  Oropharynx non-erythematous. Neck: No stridor.  No cervical spine tenderness to palpation. Hematological/Lymphatic/Immunilogical: No cervical lymphadenopathy. Cardiovascular: Normal rate, regular rhythm. Grossly normal heart sounds.  Good peripheral circulation. Respiratory: Normal respiratory effort.  No retractions. Lungs CTAB. Gastrointestinal: Soft and nontender. No distention. No abdominal bruits. No CVA tenderness. Musculoskeletal: No lower extremity  tenderness nor edema.  No joint effusions. Neurologic:  Normal speech and language. No gross focal neurologic deficits are appreciated. No gait instability. Skin: Pilonidal abscess with sinus tracts leading into the right buttock.  Psychiatric: Mood and affect are normal. Speech and behavior are normal.  ____________________________________________   LABS (all labs ordered are listed, but only abnormal results are displayed)  Labs Reviewed - No data to display ____________________________________________  EKG   ____________________________________________  RADIOLOGY   ____________________________________________   PROCEDURES  Procedure(s) performed: None  Procedures  Critical Care performed: No  ____________________________________________   INITIAL IMPRESSION / ASSESSMENT AND PLAN / ED COURSE  Pertinent labs & imaging results that were available during my care of the patient were reviewed by me and considered in my medical decision making (see chart for details).  Pilonidal abscess extending into the left buttocks. Patient given discharge care instructions. Patient advised follow-up with surgical clinic for definitive treatment. Patient started on Bactrim and tramadol.  Clinical Course     ____________________________________________   FINAL CLINICAL IMPRESSION(S) / ED DIAGNOSES  Final diagnoses:  Pilonidal abscess  Abscess of buttock, right      NEW MEDICATIONS STARTED DURING THIS VISIT:  New Prescriptions   SULFAMETHOXAZOLE-TRIMETHOPRIM (BACTRIM DS,SEPTRA DS) 800-160 MG TABLET    Take 1 tablet by mouth 2 (two) times daily.   TRAMADOL (ULTRAM) 50 MG TABLET    Take 1 tablet (50 mg total) by mouth every 6 (six) hours as needed.     Note:  This document was prepared using Dragon voice recognition software and may include unintentional dictation errors.    Joni Reining, PA-C 08/07/16 1220    Jeanmarie Plant, MD 08/07/16 [phone]

## 2017-10-10 ENCOUNTER — Encounter: Payer: Self-pay | Admitting: Emergency Medicine

## 2017-10-10 ENCOUNTER — Emergency Department
Admission: EM | Admit: 2017-10-10 | Discharge: 2017-10-10 | Disposition: A | Discharge status: Home / Self Care | Payer: Self-pay | Attending: Emergency Medicine | Admitting: Emergency Medicine

## 2017-10-10 ENCOUNTER — Emergency Department: Payer: Self-pay

## 2017-10-10 DIAGNOSIS — F17210 Nicotine dependence, cigarettes, uncomplicated: Secondary | ICD-10-CM | POA: Insufficient documentation

## 2017-10-10 DIAGNOSIS — I10 Essential (primary) hypertension: Secondary | ICD-10-CM | POA: Insufficient documentation

## 2017-10-10 DIAGNOSIS — Z79899 Other long term (current) drug therapy: Secondary | ICD-10-CM | POA: Insufficient documentation

## 2017-10-10 DIAGNOSIS — R0789 Other chest pain: Secondary | ICD-10-CM | POA: Insufficient documentation

## 2017-10-10 DIAGNOSIS — R079 Chest pain, unspecified: Secondary | ICD-10-CM

## 2017-10-10 LAB — BASIC METABOLIC PANEL
ANION GAP: 10 (ref 5–15)
BUN: 11 mg/dL (ref 6–20)
CALCIUM: 9 mg/dL (ref 8.9–10.3)
CO2: 24 mmol/L (ref 22–32)
Chloride: 101 mmol/L (ref 101–111)
Creatinine, Ser: 1.29 mg/dL — ABNORMAL HIGH (ref 0.61–1.24)
Glucose, Bld: 113 mg/dL — ABNORMAL HIGH (ref 65–99)
Potassium: 4.6 mmol/L (ref 3.5–5.1)
SODIUM: 135 mmol/L (ref 135–145)

## 2017-10-10 LAB — CBC
HEMATOCRIT: 41.5 % (ref 40.0–52.0)
Hemoglobin: 14.1 g/dL (ref 13.0–18.0)
MCH: 33.6 pg (ref 26.0–34.0)
MCHC: 34 g/dL (ref 32.0–36.0)
MCV: 98.9 fL (ref 80.0–100.0)
Platelets: 303 10*3/uL (ref 150–440)
RBC: 4.19 MIL/uL — AB (ref 4.40–5.90)
RDW: 14.1 % (ref 11.5–14.5)
WBC: 4.4 10*3/uL (ref 3.8–10.6)

## 2017-10-10 LAB — TROPONIN I

## 2017-10-10 IMAGING — CR DG CHEST 2V
2 series · 2 of 2 positions shown · non-contrast
Comparison: None.

CLINICAL DATA: LEFT chest pain and pressure for 8 hours. History of
hypertension.

EXAM:
CHEST  2 VIEW

[chest pa]
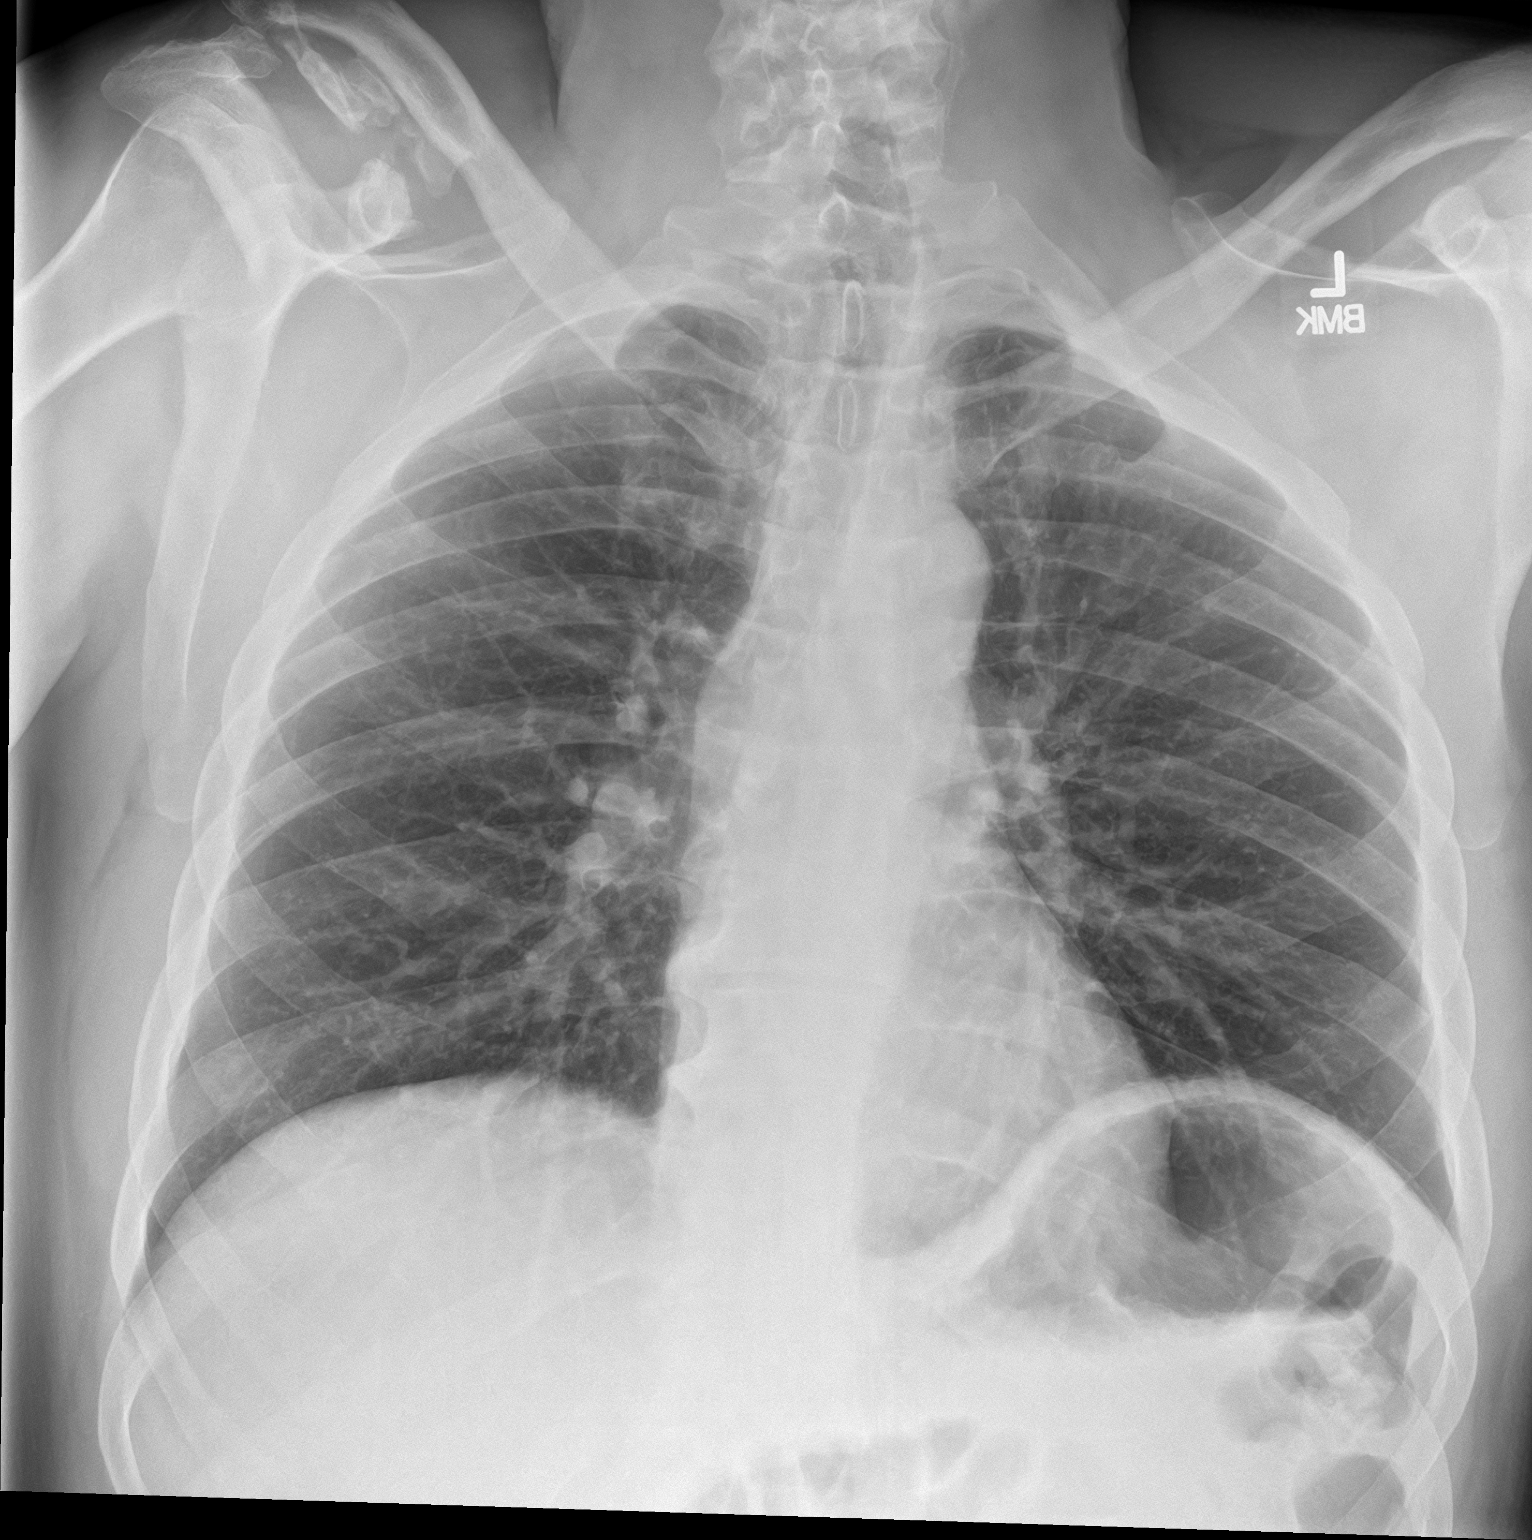

[chest lat]
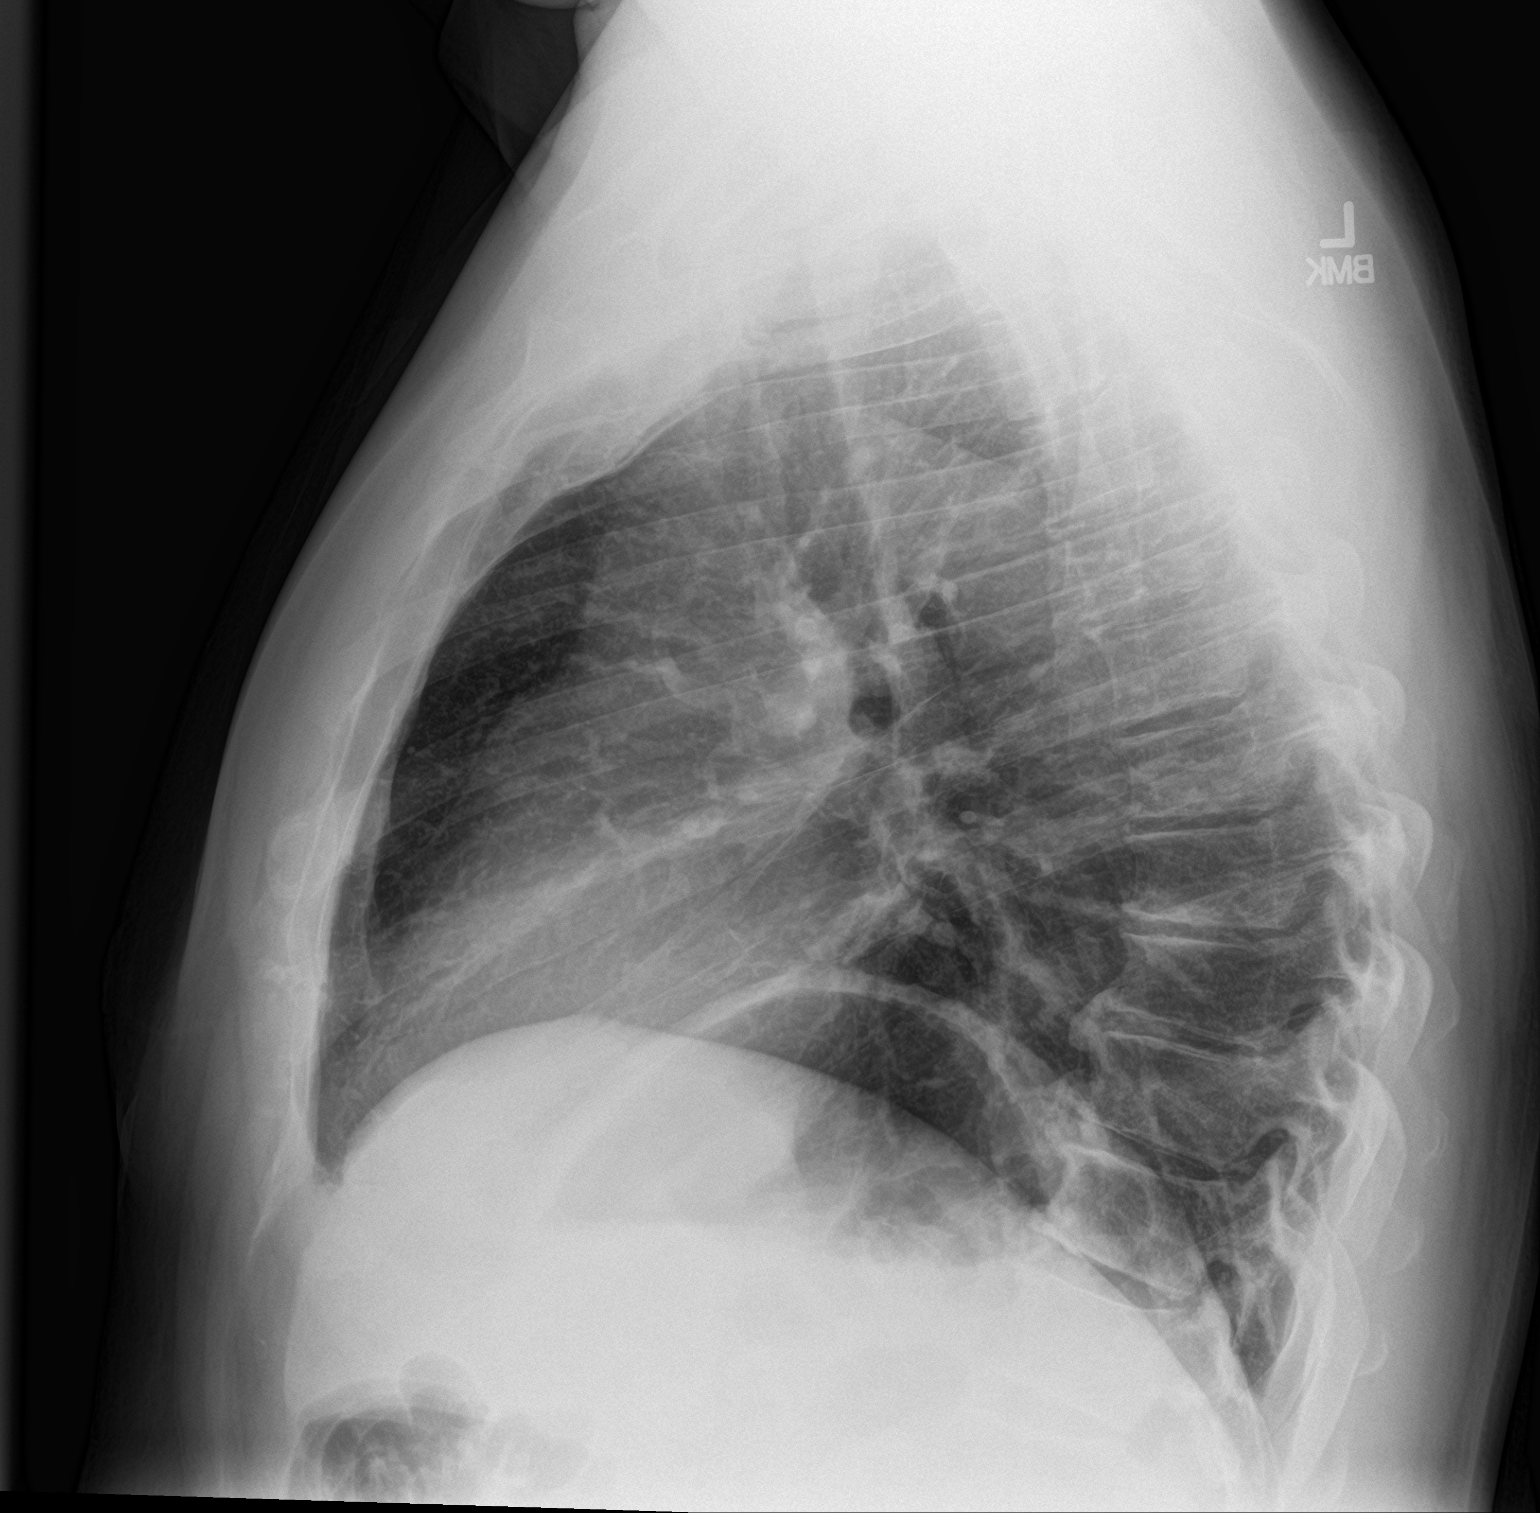

[2 of 2 positions shown; findings below may reference images not displayed]

FINDINGS: Cardiomediastinal silhouette is normal. No pleural effusions or
focal consolidations. Trachea projects midline and there is no
pneumothorax. Soft tissue planes and included osseous structures are
non-suspicious. Old RIGHT AC separation with heterotopic
ossification. Mild degenerative change of thoracic spine.
IMPRESSION: No acute cardiopulmonary process.

## 2017-10-10 MED ORDER — HYDROCHLOROTHIAZIDE 12.5 MG PO CAPS
12.5000 mg | ORAL_CAPSULE | Freq: Every day | ORAL | Status: DC
  Administered 2017-10-10: 12.5 mg via ORAL
  Filled 2017-10-10: qty 1

## 2017-10-10 MED ORDER — HYDROCHLOROTHIAZIDE 12.5 MG PO TABS: 12.5000 mg | ORAL_TABLET | Freq: Every day | ORAL | 0 refills | Status: DC

## 2017-10-10 NOTE — ED Provider Notes (Signed)
[facility] Emergency Department Provider Note  ____________________________________________   First MD Initiated Contact with Patient 10/10/17 0818     (approximate)  I have reviewed the triage vital signs and the nursing notes.   HISTORY  Chief Complaint Chest Pain   HPI James Lambert is a 51 y.o. male with a history of hypertension as well as alcoholism who is presenting to the emergency department today with left-sided chest pain.  He says that the chest pain started suddenly at about 10 PM last night.  He says that he was sitting on his porch smoking marijuana when the pain started.  He says the pain has significantly decreased since then and he thinks it may be a spasm of the muscle in his chest.  He is denying any shortness of breath, nausea vomiting or diaphoresis.  Denies any cardiac issues.  Denies any radiation of the pain.  Denies any recent heavy lifting or muscle straining.  He says that when he lays still there is no pain but when he goes to move the pain begins.  Past Medical History:  Diagnosis Date  . Hypertension     There are no active problems to display for this patient.   History reviewed. No pertinent surgical history.  Prior to Admission medications   Medication Sig Start Date End Date Taking? Authorizing Provider  meloxicam (MOBIC) 15 MG tablet Take 1 tablet (15 mg total) by mouth daily. 11/21/15   Joni ReiningSmith, Ronald K, PA-C  sulfamethoxazole-trimethoprim (BACTRIM DS,SEPTRA DS) 800-160 MG tablet Take 1 tablet by mouth 2 (two) times daily. 03/04/16   Beers, Charmayne Sheerharles M, PA-C  sulfamethoxazole-trimethoprim (BACTRIM DS,SEPTRA DS) 800-160 MG tablet Take 1 tablet by mouth 2 (two) times daily. 08/07/16   Joni ReiningSmith, Ronald K, PA-C  verapamil (CALAN) 80 MG tablet Take 1 tablet (80 mg total) by mouth 3 (three) times daily. 03/04/16   Beers, Charmayne Sheerharles M, PA-C    Allergies Patient has no known allergies.  No family history on file.  Social  History Social History  Substance Use Topics  . Smoking status: Current Every Day Smoker    Types: Cigarettes  . Smokeless tobacco: Never Used  . Alcohol use Yes     Comment: daily    Review of Systems  Constitutional: No fever/chills Eyes: No visual changes. ENT: No sore throat. Cardiovascular: As above  respiratory: Denies shortness of breath. Gastrointestinal: No abdominal pain.  No nausea, no vomiting.  No diarrhea.  No constipation. Genitourinary: Negative for dysuria. Musculoskeletal: Negative for back pain. Skin: Negative for rash. Neurological: Negative for headaches, focal weakness or numbness.   ____________________________________________   PHYSICAL EXAM:  VITAL SIGNS: ED Triage Vitals  Enc Vitals Group     BP 10/10/17 0640 (!) 176/107     Pulse Rate 10/10/17 0640 78     Resp 10/10/17 0640 18     Temp 10/10/17 0640 99.1 F (37.3 C)     Temp Source 10/10/17 0640 Oral     SpO2 10/10/17 0640 99 %     Weight 10/10/17 0542 300 lb (136.1 kg)     Height 10/10/17 0542 6\' 2"  (1.88 m)     Head Circumference --      Peak Flow --      Pain Score 10/10/17 0542 10     Pain Loc --      Pain Edu? --      Excl. in GC? --     Constitutional: Alert and oriented. Well  appearing and in no acute distress.  However, when the patient moves he grimaces secondary to the pain  eyes: Conjunctivae are normal.  Head: Atraumatic. Nose: No congestion/rhinnorhea. Mouth/Throat: Mucous membranes are moist.  Neck: No stridor.   Cardiovascular: Normal rate, regular rhythm. Grossly normal heart sounds.  No tenderness to palpation over the chest wall. Respiratory: Normal respiratory effort.  No retractions. Lungs CTAB. Gastrointestinal: Soft and nontender. No distention.  Musculoskeletal: No lower extremity tenderness nor edema.  No joint effusions. Neurologic:  Normal speech and language. No gross focal neurologic deficits are appreciated. Skin:  Skin is warm, dry and intact. No rash  noted. Psychiatric: Mood and affect are normal. Speech and behavior are normal.  ____________________________________________   LABS (all labs ordered are listed, but only abnormal results are displayed)  Labs Reviewed  BASIC METABOLIC PANEL - Abnormal; Notable for the following:       Result Value   Glucose, Bld 113 (*)    Creatinine, Ser 1.29 (*)    All other components within normal limits  CBC - Abnormal; Notable for the following:    RBC 4.19 (*)    All other components within normal limits  TROPONIN I   ____________________________________________  EKG  ED ECG REPORT I, Arelia Longest, the attending physician, personally viewed and interpreted this ECG.   Date: 10/10/2017  EKG Time: 0545  Rate: 93  Rhythm: normal sinus rhythm  Axis: Normal  Intervals:none  ST&T Change: No ST segment elevation or depression.  No abnormal T wave inversion.  ____________________________________________  RADIOLOGY  No acute disease ____________________________________________   PROCEDURES  Procedure(s) performed:   Procedures  Critical Care performed:   ____________________________________________   INITIAL IMPRESSION / ASSESSMENT AND PLAN / ED COURSE  Pertinent labs & imaging results that were available during my care of the patient were reviewed by me and considered in my medical decision making (see chart for details).  Differential diagnosis includes, but is not limited to, ACS, aortic dissection, pulmonary embolism, cardiac tamponade, pneumothorax, pneumonia, pericarditis/myocarditis, GI-related causes including esophagitis/gastritis, and musculoskeletal chest wall pain.    As part of my medical decision making, I reviewed the following data within the electronic MEDICAL RECORD NUMBER Notes from prior ED visits   Patient with greater than 8 hours of pain with a reassuring EKG as well as normal troponin level.  Also with history very consistent with chest wall  pain.  Patient does have out of control blood pressure and is supposed to be taking verapamil but he says he cannot afford it.  I will start him on HCTZ.  The patient will be following up with primary care.  We discussed using icy hot as well as stretching to alleviate the pain.  Patient understands the need for follow-up with primary care as well for blood pressure recheck.      ____________________________________________   FINAL CLINICAL IMPRESSION(S) / ED DIAGNOSES  Chest wall pain.  Uncontrolled hypertension.    NEW MEDICATIONS STARTED DURING THIS VISIT:  New Prescriptions   No medications on file     Note:  This document was prepared using Dragon voice recognition software and may include unintentional dictation errors.     Myrna Blazer, MD 10/10/17 [phone]

## 2017-10-10 NOTE — ED Triage Notes (Signed)
Pt arrived via pov with complaints of chest pain. Pt reports pain started 8 hours ago and is located on the left side of his chest with no radiating pain. Pt describes the pain as a pressure and states pain intensifies with cough and pain. Pt alert and oriented and in no apparent distress.

## 2018-09-05 ENCOUNTER — Other Ambulatory Visit: Payer: Self-pay

## 2018-09-05 ENCOUNTER — Emergency Department: Payer: No Typology Code available for payment source

## 2018-09-05 ENCOUNTER — Emergency Department
Admission: EM | Admit: 2018-09-05 | Discharge: 2018-09-05 | Disposition: A | Discharge status: Home / Self Care | Payer: No Typology Code available for payment source | Attending: Emergency Medicine | Admitting: Emergency Medicine

## 2018-09-05 ENCOUNTER — Encounter: Payer: Self-pay | Admitting: *Deleted

## 2018-09-05 DIAGNOSIS — I10 Essential (primary) hypertension: Secondary | ICD-10-CM | POA: Insufficient documentation

## 2018-09-05 DIAGNOSIS — F17210 Nicotine dependence, cigarettes, uncomplicated: Secondary | ICD-10-CM | POA: Diagnosis not present

## 2018-09-05 DIAGNOSIS — Y9389 Activity, other specified: Secondary | ICD-10-CM | POA: Diagnosis not present

## 2018-09-05 DIAGNOSIS — M791 Myalgia, unspecified site: Secondary | ICD-10-CM | POA: Diagnosis not present

## 2018-09-05 DIAGNOSIS — Y998 Other external cause status: Secondary | ICD-10-CM | POA: Diagnosis not present

## 2018-09-05 DIAGNOSIS — M7918 Myalgia, other site: Secondary | ICD-10-CM

## 2018-09-05 DIAGNOSIS — S4981XA Other specified injuries of right shoulder and upper arm, initial encounter: Secondary | ICD-10-CM | POA: Diagnosis present

## 2018-09-05 DIAGNOSIS — Z79899 Other long term (current) drug therapy: Secondary | ICD-10-CM | POA: Diagnosis not present

## 2018-09-05 DIAGNOSIS — S20211A Contusion of right front wall of thorax, initial encounter: Secondary | ICD-10-CM | POA: Diagnosis not present

## 2018-09-05 DIAGNOSIS — Y92410 Unspecified street and highway as the place of occurrence of the external cause: Secondary | ICD-10-CM | POA: Diagnosis not present

## 2018-09-05 DIAGNOSIS — Z91199 Patient's noncompliance with other medical treatment and regimen due to unspecified reason: Secondary | ICD-10-CM

## 2018-09-05 DIAGNOSIS — Z9119 Patient's noncompliance with other medical treatment and regimen: Secondary | ICD-10-CM | POA: Diagnosis not present

## 2018-09-05 IMAGING — CR DG RIBS W/ CHEST 3+V*R*
1 series · 4 of 4 positions shown · non-contrast
Comparison: None.

CLINICAL DATA: Right posterior rib pain.

EXAM:
RIGHT RIBS AND CHEST - 3+ VIEW

[Series 1: dg ribs unilateral w/chest right · 0.14mm/px · 4 of 4 slices shown]
[im 1/4]
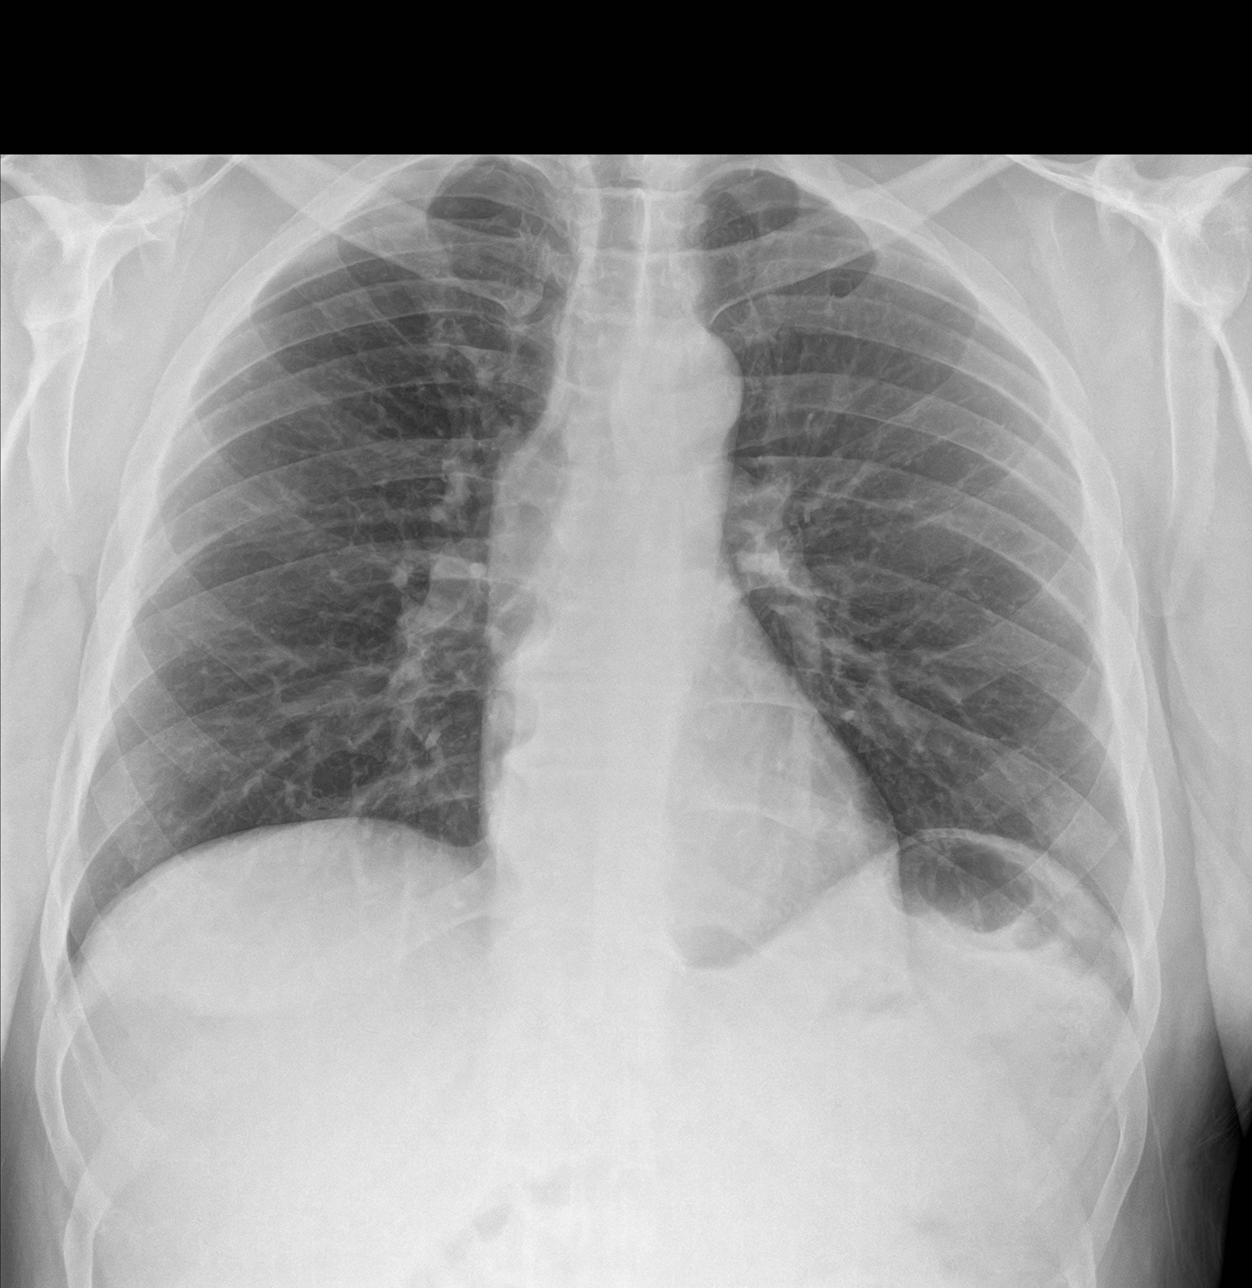
[im 2/4]
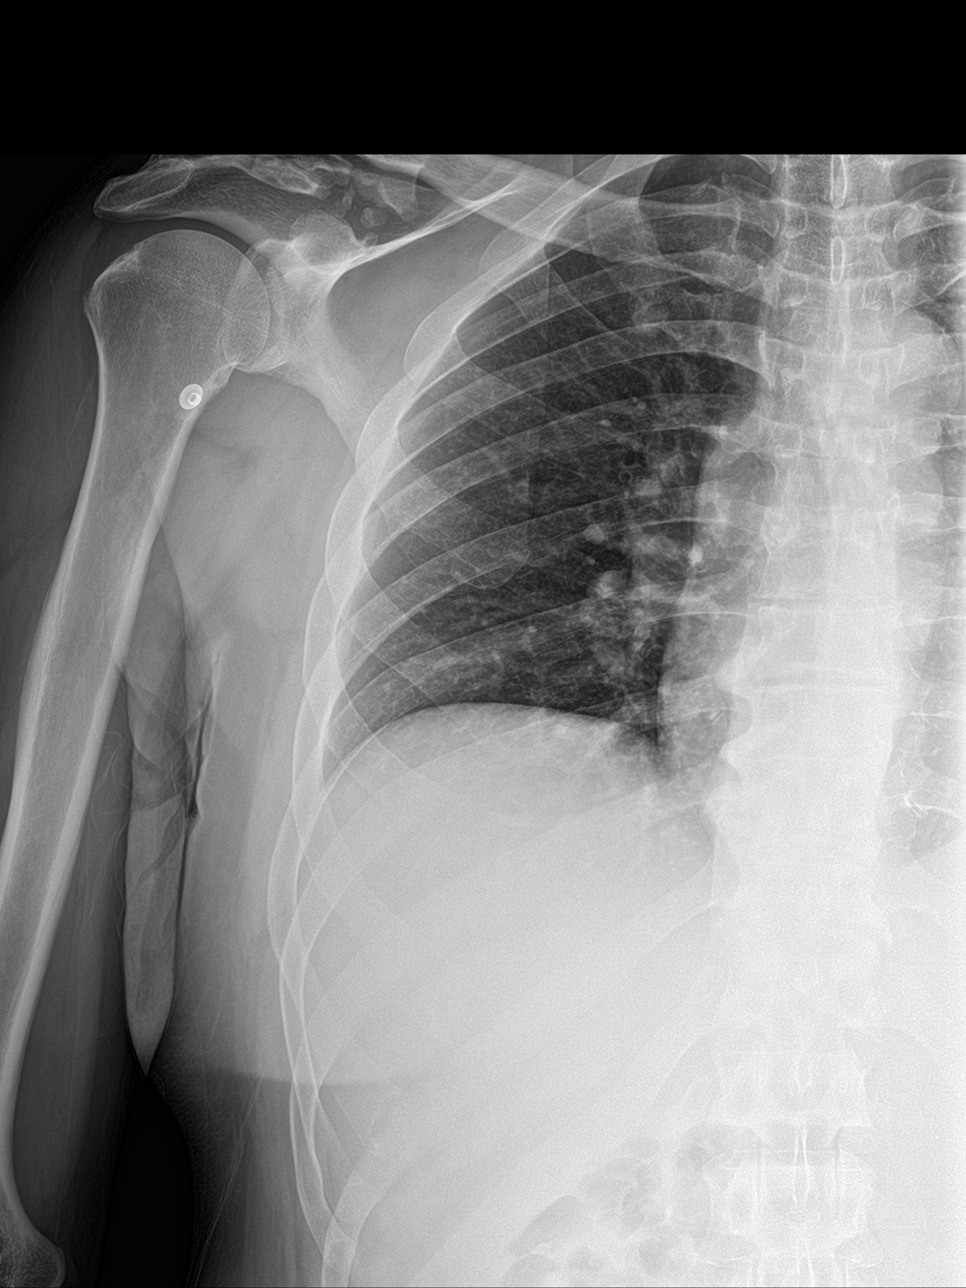
[im 3/4]
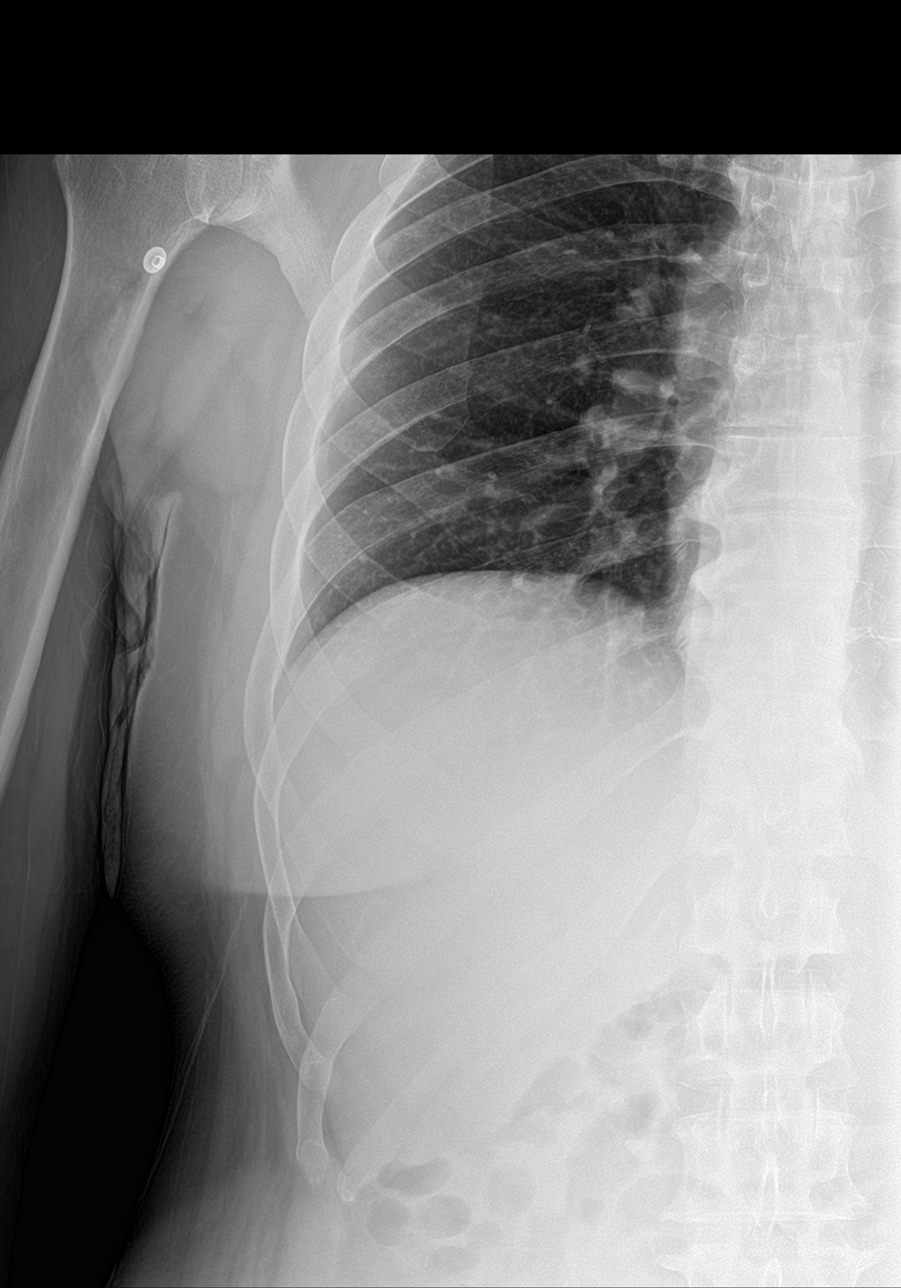
[im 4/4]
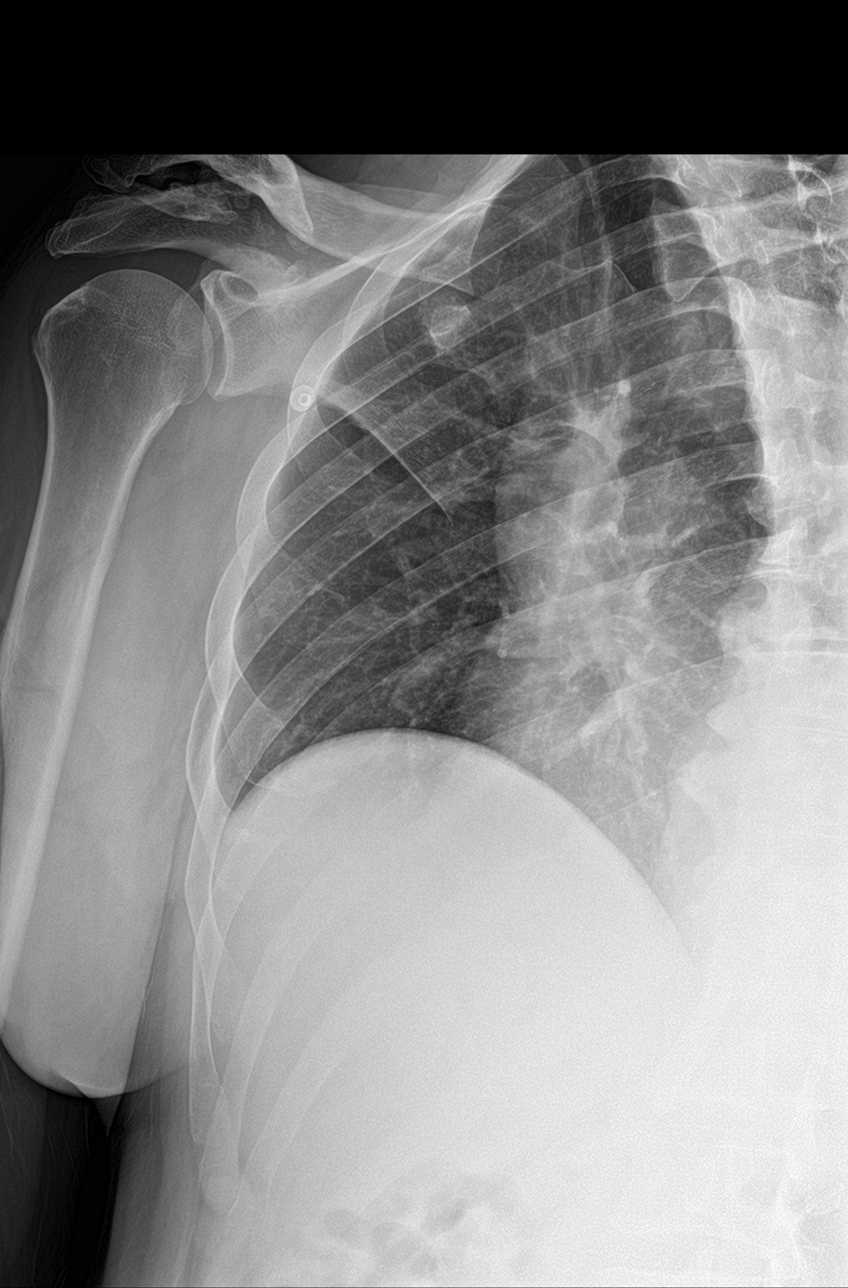

[4 of 4 positions shown; findings below may reference images not displayed]

FINDINGS: No fracture or other bone lesions are seen involving the ribs. There
is no evidence of pneumothorax or pleural effusion. Both lungs are
clear. Heart size and mediastinal contours are within normal limits.
IMPRESSION: Negative.

## 2018-09-05 MED ORDER — KETOROLAC TROMETHAMINE 30 MG/ML IJ SOLN
30.0000 mg | Freq: Once | INTRAMUSCULAR | Status: AC
  Administered 2018-09-05: 30 mg via INTRAMUSCULAR
  Filled 2018-09-05: qty 1

## 2018-09-05 MED ORDER — HYDROCHLOROTHIAZIDE 12.5 MG PO TABS: 12.5000 mg | ORAL_TABLET | Freq: Every day | ORAL | 0 refills | Status: DC

## 2018-09-05 NOTE — ED Notes (Signed)
Pt advised that he needed to stay for more  evaluation  Pt  Declines - He agrees to sigh out ama
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
## 2018-09-05 NOTE — ED Notes (Signed)
PA Summers notified pts BP 202/116
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
## 2018-09-05 NOTE — Discharge Instructions (Addendum)
You will need to be seen at [facility] tomorrow for evaluation of your blood pressure and also for treatment.  A prescription for the last blood pressure medicine listed in your chart was written for you to begin taking.  This medication will not control your blood pressure completely.  Continue taking Tylenol or ibuprofen as needed for muscle aches.  You may also use ice to your ribs and shoulder.  Return to the emergency department if any sudden changes, headache, chest pain, shortness of breath or dizziness.  Your blood pressure is 202/116.  You are aware that your blood pressure is this high and still want to leave.  You acknowledged that this increases your risk for heart attack and stroke.

## 2019-03-12 ENCOUNTER — Other Ambulatory Visit: Payer: Self-pay

## 2019-03-12 ENCOUNTER — Emergency Department
Admission: EM | Admit: 2019-03-12 | Discharge: 2019-03-12 | Disposition: A | Discharge status: Home / Self Care | Payer: No Typology Code available for payment source | Attending: Emergency Medicine | Admitting: Emergency Medicine

## 2019-03-12 ENCOUNTER — Encounter: Payer: Self-pay | Admitting: Emergency Medicine

## 2019-03-12 DIAGNOSIS — F17210 Nicotine dependence, cigarettes, uncomplicated: Secondary | ICD-10-CM | POA: Insufficient documentation

## 2019-03-12 DIAGNOSIS — J4520 Mild intermittent asthma, uncomplicated: Secondary | ICD-10-CM

## 2019-03-12 DIAGNOSIS — R05 Cough: Secondary | ICD-10-CM | POA: Diagnosis present

## 2019-03-12 DIAGNOSIS — Z9114 Patient's other noncompliance with medication regimen: Secondary | ICD-10-CM | POA: Diagnosis not present

## 2019-03-12 DIAGNOSIS — I10 Essential (primary) hypertension: Secondary | ICD-10-CM | POA: Diagnosis not present

## 2019-03-12 MED ORDER — HYDROCHLOROTHIAZIDE 25 MG PO TABS: 25.0000 mg | ORAL_TABLET | Freq: Every day | ORAL | 0 refills | Status: DC

## 2019-03-12 MED ORDER — ALBUTEROL SULFATE HFA 108 (90 BASE) MCG/ACT IN AERS: 2.0000 | INHALATION_SPRAY | Freq: Four times a day (QID) | RESPIRATORY_TRACT | 2 refills | Status: DC | PRN

## 2019-03-12 NOTE — ED Provider Notes (Signed)
[facility] Emergency Department Provider Note  ____________________________________________   None    (approximate)  I have reviewed the triage vital signs and the nursing notes.   HISTORY  Chief Complaint Cough    HPI James Lambert is a 53 y.o. male presents to tent with problems due to asthma last pm.  He denies any fever, chills, body aches or feeling bad.  He has hx of allergies and asthma and feels like this is the same.  He used the last of an inhaler that is 6-26 years old.  He continues to smoke daily without difficulty.  He was seen in Sept. 2019 and given a Rx which he did not get filled.  He denies any headache, SOB, chest pain.      Past Medical History:  Diagnosis Date  . Hypertension     There are no active problems to display for this patient.   History reviewed. No pertinent surgical history.  Prior to Admission medications   Medication Sig Start Date End Date Taking? Authorizing Provider  albuterol (PROVENTIL HFA;VENTOLIN HFA) 108 (90 Base) MCG/ACT inhaler Inhale 2 puffs into the lungs every 6 (six) hours as needed for wheezing or shortness of breath. 03/12/19   Tommi Rumps, PA-C  hydrochlorothiazide (HYDRODIURIL) 25 MG tablet Take 1 tablet (25 mg total) by mouth daily. 03/12/19   Tommi Rumps, PA-C  verapamil (CALAN) 80 MG tablet Take 1 tablet (80 mg total) by mouth 3 (three) times daily. 03/04/16   Beers, Charmayne Sheer, PA-C    Allergies Patient has no known allergies.  History reviewed. No pertinent family history.  Social History Social History   Tobacco Use  . Smoking status: Current Every Day Smoker    Types: Cigarettes  . Smokeless tobacco: Never Used  Substance Use Topics  . Alcohol use: Yes    Comment: daily  . Drug use: No    Review of Systems Constitutional: No fever ENT: no congestion or rhinorrhea Cardiovascular: No chest pain. Respiratory: rare wheeze, denies cough Musculoskeletal: Negative for  neck pain nor stiffness. Integumentary: Negative for rash. Neurological: No focal weakness nor numbness.   ____________________________________________   PHYSICAL EXAM:  VITAL SIGNS: ED Triage Vitals [03/12/19 1204]  Enc Vitals Group     BP (!) 183/135     Pulse Rate 89     Resp 18     Temp 98.7 F (37.1 C)     Temp Source Oral     SpO2 96 %     Weight 250 lb (113.4 kg)     Height 6\' 2"  (1.88 m)     Head Circumference      Peak Flow      Pain Score 0     Pain Loc      Pain Edu?      Excl. in GC?     Constitutional: Alert and oriented. Generally well appearing and in no acute distress. Eyes: Conjunctivae are normal.  Nose: no congestion Neck: No stridor.  No meningeal signs.   Cardiovascular: Grossly normal heart sounds. Respiratory: rare faint exp. Wheeze.  No rales, rhonchi or decrease lung sounds.  Talks without difficulty.   Musculoskeletal: No lower extremity tenderness nor edema. No gross deformities of extremities. Neurologic:  Normal speech and language. No gross focal neurologic deficits are appreciated.  Skin:  Skin is warm, dry and intact. No rash noted.   ____________________________________________   LABS (all labs ordered are listed, but only abnormal results  are displayed)  Labs Reviewed - No data to display  ____________________________________________   RADIOLOGY Official radiology report(s): No results found.  ____________________________________________    INITIAL IMPRESSION / MDM / ASSESSMENT AND PLAN / ED COURSE  As part of my medical decision making, I reviewed the following data within the electronic MEDICAL RECORD NUMBER Notes from prior ED visits and [place] Controlled Substance Database  Patient presents to the ED tent with no symptoms of fever, cough, congestion or body aches suggestive of a viral illness.  He states that this is his normal asthma and that he is out of an inhaler.  We also discussed his hypertension for which he was seen  in September 2019 at which time he was given a prescription which he never got filled.  We discussed his higher probability that he would come back with a stroke or an MI versus have an Covid 19.  Patient was made aware of his blood pressure.  He was also given contact information and [facility] and made aware that he could go there without charge for treatment of his hypertension.  Patient states he understands and will comply.  A prescription for Proventil inhaler was given along with a prescription for hydrochlorothiazide 25 mg.  We also discussed cigarette smoking however it appears that he is not interested in stopping.  Patient's vital signs are stable.  This patient currently does not meet criteria for testing and I explained that in detail to the patient.  The evaluation today is reassuring with no evidence of emergent medical condition that requires further work-up or evaluation or inpatient treatment.  I provided follow-up recommendations and strict return precautions.  The patient understands and agrees with the plan.   ____________________________________________  FINAL CLINICAL IMPRESSION(S) / ED DIAGNOSES  Final diagnoses:  Mild intermittent asthma without complication  Hypertension, unspecified type  Noncompliance with medications        Note:  This document was prepared using Dragon voice recognition software and may include unintentional dictation errors.   Tommi Rumps, PA-C 03/12/19 1349    Don Perking, [place], MD 03/12/19 [phone]

## 2019-03-12 NOTE — ED Notes (Signed)
Pt with hx of HTN , out meds for approx 6 months

## 2019-03-12 NOTE — ED Triage Notes (Signed)
Cough , sinus congestion , hx of asthma , no respiratory distress noted

## 2020-01-02 ENCOUNTER — Encounter: Payer: Self-pay | Admitting: Emergency Medicine

## 2020-01-02 ENCOUNTER — Emergency Department
Admission: EM | Admit: 2020-01-02 | Discharge: 2020-01-03 | Disposition: A | Discharge status: Home / Self Care | Payer: Self-pay | Attending: Emergency Medicine | Admitting: Emergency Medicine

## 2020-01-02 ENCOUNTER — Other Ambulatory Visit: Payer: Self-pay

## 2020-01-02 ENCOUNTER — Emergency Department: Payer: Self-pay

## 2020-01-02 DIAGNOSIS — Z20822 Contact with and (suspected) exposure to covid-19: Secondary | ICD-10-CM | POA: Insufficient documentation

## 2020-01-02 DIAGNOSIS — J189 Pneumonia, unspecified organism: Secondary | ICD-10-CM | POA: Insufficient documentation

## 2020-01-02 DIAGNOSIS — R0789 Other chest pain: Secondary | ICD-10-CM

## 2020-01-02 DIAGNOSIS — R509 Fever, unspecified: Secondary | ICD-10-CM | POA: Insufficient documentation

## 2020-01-02 DIAGNOSIS — F17210 Nicotine dependence, cigarettes, uncomplicated: Secondary | ICD-10-CM | POA: Insufficient documentation

## 2020-01-02 DIAGNOSIS — I10 Essential (primary) hypertension: Secondary | ICD-10-CM | POA: Insufficient documentation

## 2020-01-02 LAB — URINALYSIS, ROUTINE W REFLEX MICROSCOPIC
Bacteria, UA: NONE SEEN
Bilirubin Urine: NEGATIVE
Glucose, UA: NEGATIVE mg/dL
Ketones, ur: 5 mg/dL — AB
Leukocytes,Ua: NEGATIVE
Nitrite: NEGATIVE
Protein, ur: 30 mg/dL — AB
Specific Gravity, Urine: 1.02 (ref 1.005–1.030)
pH: 5 (ref 5.0–8.0)

## 2020-01-02 LAB — COMPREHENSIVE METABOLIC PANEL
ALT: 24 U/L (ref 0–44)
AST: 39 U/L (ref 15–41)
Albumin: 3.4 g/dL — ABNORMAL LOW (ref 3.5–5.0)
Alkaline Phosphatase: 59 U/L (ref 38–126)
Anion gap: 15 (ref 5–15)
BUN: 9 mg/dL (ref 6–20)
CO2: 19 mmol/L — ABNORMAL LOW (ref 22–32)
Calcium: 8.5 mg/dL — ABNORMAL LOW (ref 8.9–10.3)
Chloride: 96 mmol/L — ABNORMAL LOW (ref 98–111)
Creatinine, Ser: 0.85 mg/dL (ref 0.61–1.24)
GFR calc Af Amer: >60 mL/min (ref >60)
GFR calc non Af Amer: >60 mL/min (ref >60)
Glucose, Bld: 106 mg/dL — ABNORMAL HIGH (ref 70–99)
Potassium: 3.2 mmol/L — ABNORMAL LOW (ref 3.5–5.1)
Sodium: 130 mmol/L — ABNORMAL LOW (ref 135–145)
Total Bilirubin: 1 mg/dL (ref 0.3–1.2)
Total Protein: 8.6 g/dL — ABNORMAL HIGH (ref 6.5–8.1)

## 2020-01-02 LAB — CBC WITH DIFFERENTIAL/PLATELET
Abs Immature Granulocytes: 0.09 10*3/uL — ABNORMAL HIGH (ref 0.00–0.07)
Basophils Absolute: 0 10*3/uL (ref 0.0–0.1)
Basophils Relative: 0 %
Eosinophils Absolute: 0.1 10*3/uL (ref 0.0–0.5)
Eosinophils Relative: 1 %
HCT: 36.9 % — ABNORMAL LOW (ref 39.0–52.0)
Hemoglobin: 12.4 g/dL — ABNORMAL LOW (ref 13.0–17.0)
Immature Granulocytes: 1 %
Lymphocytes Relative: 11 %
Lymphs Abs: 0.9 10*3/uL (ref 0.7–4.0)
MCH: 33.2 pg (ref 26.0–34.0)
MCHC: 33.6 g/dL (ref 30.0–36.0)
MCV: 98.7 fL (ref 80.0–100.0)
Monocytes Absolute: 1 10*3/uL (ref 0.1–1.0)
Monocytes Relative: 12 %
Neutro Abs: 6 10*3/uL (ref 1.7–7.7)
Neutrophils Relative %: 75 %
Platelets: 190 10*3/uL (ref 150–400)
RBC: 3.74 MIL/uL — ABNORMAL LOW (ref 4.22–5.81)
RDW: 13.2 % (ref 11.5–15.5)
WBC: 8 10*3/uL (ref 4.0–10.5)
nRBC: 0 % (ref 0.0–0.2)

## 2020-01-02 LAB — PROTIME-INR
INR: 1 (ref 0.8–1.2)
Prothrombin Time: 13 seconds (ref 11.4–15.2)

## 2020-01-02 LAB — LACTIC ACID, PLASMA: Lactic Acid, Venous: 1.2 mmol/L (ref 0.5–1.9)

## 2020-01-02 LAB — TROPONIN I (HIGH SENSITIVITY): Troponin I (High Sensitivity): 5 ng/L (ref <18)

## 2020-01-02 LAB — POC SARS CORONAVIRUS 2 AG: SARS Coronavirus 2 Ag: NEGATIVE

## 2020-01-02 MED ORDER — SODIUM CHLORIDE 0.9 % IV SOLN
1.0000 g | Freq: Once | INTRAVENOUS | Status: AC
  Administered 2020-01-03: 1 g via INTRAVENOUS
  Filled 2020-01-02: qty 10

## 2020-01-02 MED ORDER — KETOROLAC TROMETHAMINE 30 MG/ML IJ SOLN
30.0000 mg | Freq: Once | INTRAMUSCULAR | Status: AC
  Administered 2020-01-02: 30 mg via INTRAVENOUS
  Filled 2020-01-02: qty 1

## 2020-01-02 MED ORDER — SODIUM CHLORIDE 0.9 % IV SOLN
500.0000 mg | Freq: Once | INTRAVENOUS | Status: AC
  Administered 2020-01-03: 500 mg via INTRAVENOUS
  Filled 2020-01-02: qty 500

## 2020-01-02 MED ORDER — SODIUM CHLORIDE 0.9 % IV SOLN: Freq: Once | INTRAVENOUS | Status: AC

## 2020-01-02 NOTE — ED Triage Notes (Signed)
Pt arrived from home via [facility] c/o chest pain 45 min ago. On arrival pt rated pain 6 out of 10. EMS gave 3 baby aspirin during transport. Pt has hx of HTN BP for EMS 175/119, pt has not taken meds for 1.5 years. Pt had RR of 30 and temp of 101.5.

## 2020-01-02 NOTE — ED Provider Notes (Signed)
[facility] Emergency Department Provider Note       Time seen: ----------------------------------------- 9:00 PM on 01/02/2020 -----------------------------------------   I have reviewed the triage vital signs and the nursing notes.  HISTORY   Chief Complaint Chest Pain   HPI James Lambert is a 54 y.o. male with a history of hypertension who presents to the ED for chest pain that began 45 minutes ago.  On arrival he rated the pain 6 out of 10.  He took 3 baby aspirin during transport.  Patient reports being out of his blood pressure medicine for the last year and a half.  He was also found to be febrile on arrival.  Pain is sharp and in the left side of his chest.  Past Medical History:  Diagnosis Date  . Hypertension     There are no problems to display for this patient.   History reviewed. No pertinent surgical history.  Allergies Patient has no known allergies.  Social History Social History   Tobacco Use  . Smoking status: Current Every Day Smoker    Types: Cigarettes  . Smokeless tobacco: Never Used  Substance Use Topics  . Alcohol use: Yes    Comment: daily  . Drug use: Yes    Types: Marijuana    Review of Systems Constitutional: Positive for fever Cardiovascular: Positive for chest pain Respiratory: Negative for shortness of breath. Gastrointestinal: Negative for abdominal pain, vomiting and diarrhea. Musculoskeletal: Negative for back pain. Skin: Negative for rash. Neurological: Negative for headaches, focal weakness or numbness.  All systems negative/normal/unremarkable except as stated in the HPI  ____________________________________________   PHYSICAL EXAM:  VITAL SIGNS: ED Triage Vitals  Enc Vitals Group     BP 01/02/20 2044 (!) 171/111     Pulse Rate 01/02/20 2044 (!) 108     Resp 01/02/20 2044 (!) 26     Temp 01/02/20 2044 (!) 102.3 F (39.1 C)     Temp Source 01/02/20 2044 Oral     SpO2 01/02/20 2044 96 %      Weight 01/02/20 2045 250 lb (113.4 kg)     Height 01/02/20 2045 6\' 2"  (1.88 m)     Head Circumference --      Peak Flow --      Pain Score 01/02/20 2045 4     Pain Loc --      Pain Edu? --      Excl. in [place]? --     Constitutional: Alert and oriented. Well appearing and in no distress. Eyes: Conjunctivae are normal. Normal extraocular movements. Cardiovascular: Rapid rate, regular rhythm. No murmurs, rubs, or gallops. Respiratory: Normal respiratory effort without tachypnea nor retractions. Breath sounds are clear and equal bilaterally. No wheezes/rales/rhonchi. Gastrointestinal: Soft and nontender. Normal bowel sounds Musculoskeletal: Nontender with normal range of motion in extremities. No lower extremity tenderness nor edema. Neurologic:  Normal speech and language. No gross focal neurologic deficits are appreciated.  Skin:  Skin is warm, dry and intact. No rash noted. Psychiatric: Mood and affect are normal. Speech and behavior are normal.  ____________________________________________  EKG: Interpreted by me.  Sinus tachycardia with a rate of 108 bpm, old anterior infarct, normal axis, normal QT  ____________________________________________  ED COURSE:  As part of my medical decision making, I reviewed the following data within the [place] History obtained from family if available, nursing notes, old chart and ekg, as well as notes from prior ED visits. Patient presented for chest pain  and fever, we will assess with labs and imaging as indicated at this time.   Procedures  James Lambert was evaluated in Emergency Department on 01/02/2020 for the symptoms described in the history of present illness. He was evaluated in the context of the global COVID-19 pandemic, which necessitated consideration that the patient might be at risk for infection with the SARS-CoV-2 virus that causes COVID-19. Institutional protocols and algorithms that pertain to the evaluation  of patients at risk for COVID-19 are in a state of rapid change based on information released by regulatory bodies including the CDC and federal and state organizations. These policies and algorithms were followed during the patient's care in the ED.  ____________________________________________   LABS (pertinent positives/negatives)  Labs Reviewed  COMPREHENSIVE METABOLIC PANEL - Abnormal; Notable for the following components:      Result Value   Sodium 130 (*)    Potassium 3.2 (*)    Chloride 96 (*)    CO2 19 (*)    Glucose, Bld 106 (*)    Calcium 8.5 (*)    Total Protein 8.6 (*)    Albumin 3.4 (*)    All other components within normal limits  CBC WITH DIFFERENTIAL/PLATELET - Abnormal; Notable for the following components:   RBC 3.74 (*)    Hemoglobin 12.4 (*)    HCT 36.9 (*)    Abs Immature Granulocytes 0.09 (*)    All other components within normal limits  CULTURE, BLOOD (ROUTINE X 2)  CULTURE, BLOOD (ROUTINE X 2)  URINE CULTURE  LACTIC ACID, PLASMA  PROTIME-INR  LACTIC ACID, PLASMA  URINALYSIS, ROUTINE W REFLEX MICROSCOPIC  POC SARS CORONAVIRUS 2 AG -  ED  TROPONIN I (HIGH SENSITIVITY)    RADIOLOGY Images were viewed by me  Chest x-ray  IMPRESSION:  New LEFT LOWER lung opacity which may represent pneumonia or  possibly atelectasis.  ____________________________________________   DIFFERENTIAL DIAGNOSIS   COVID-19, pneumonia, PE, MI, hypertensive urgency  FINAL ASSESSMENT AND PLAN  Fever, chest pain, pneumonia   Plan: The patient had presented for chest pain and fever. Patient's labs thus far have been mildly abnormal with hyponatremia. Patient's imaging revealed a left lower lobe opacity likely indicating pneumonia.  Clinically I am suspecting COVID-19 but these results are pending.   Ulice Dash, MD    Note: This note was generated in part or whole with voice recognition software. Voice recognition is usually quite accurate but there are  transcription errors that can and very often do occur. I apologize for any typographical errors that were not detected and corrected.     Emily Filbert, MD 01/02/20 2248

## 2020-01-03 LAB — TROPONIN I (HIGH SENSITIVITY): Troponin I (High Sensitivity): 4 ng/L (ref <18)

## 2020-01-03 LAB — RESPIRATORY PANEL BY RT PCR (FLU A&B, COVID)
Influenza A by PCR: NEGATIVE
Influenza B by PCR: NEGATIVE
SARS Coronavirus 2 by RT PCR: NEGATIVE

## 2020-01-03 MED ORDER — AMLODIPINE BESYLATE 5 MG PO TABS: 5.0000 mg | ORAL_TABLET | Freq: Every day | ORAL | 0 refills | Status: DC

## 2020-01-03 MED ORDER — AZITHROMYCIN 250 MG PO TABS: 250.0000 mg | ORAL_TABLET | Freq: Every day | ORAL | 0 refills | Status: DC

## 2020-01-03 MED ORDER — AMOXICILLIN-POT CLAVULANATE 875-125 MG PO TABS: 1.0000 | ORAL_TABLET | Freq: Two times a day (BID) | ORAL | 0 refills | Status: DC

## 2020-01-03 NOTE — Discharge Instructions (Signed)
1.  Take antibiotics as prescribed: Augmentin 875 mg twice daily x7 days Azithromycin 250mg  daily x4 days 2.  Alternate Tylenol and ibuprofen every 4 hours as needed for fever greater than 100.4 F. 3.  Restart Amlodipine 5 mg daily for your blood pressure. 4.  Return to the ER for worsening symptoms, persistent vomiting, difficulty breathing or other concerns.

## 2020-01-03 NOTE — ED Provider Notes (Signed)
-----------------------------------------   1:07 AM on 01/03/2020 -----------------------------------------  Patient resting in no acute distress.  Room air saturations 97%.  Updated him of negative repeat troponin and negative Covid result.  IV azithromycin infusing.  Will discharge home on antibiotics after completion of IV antibiotics.  Will also restart amlodipine.  Strict return precautions given.  Patient verbalizes understanding and agrees with plan of care.   Irean Hong, MD 01/03/20 [phone]

## 2020-01-04 LAB — URINE CULTURE: Culture: NO GROWTH

## 2020-01-07 LAB — CULTURE, BLOOD (ROUTINE X 2)
Culture: NO GROWTH
Culture: NO GROWTH
Special Requests: ADEQUATE

## 2020-04-20 ENCOUNTER — Ambulatory Visit: Payer: Self-pay

## 2020-06-04 ENCOUNTER — Encounter: Payer: Self-pay | Admitting: Emergency Medicine

## 2020-06-04 ENCOUNTER — Emergency Department: Payer: Self-pay

## 2020-06-04 ENCOUNTER — Emergency Department
Admission: EM | Admit: 2020-06-04 | Discharge: 2020-06-04 | Disposition: A | Discharge status: Home / Self Care | Payer: Self-pay | Attending: Emergency Medicine | Admitting: Emergency Medicine

## 2020-06-04 ENCOUNTER — Other Ambulatory Visit: Payer: Self-pay

## 2020-06-04 DIAGNOSIS — J45909 Unspecified asthma, uncomplicated: Secondary | ICD-10-CM | POA: Insufficient documentation

## 2020-06-04 DIAGNOSIS — R109 Unspecified abdominal pain: Secondary | ICD-10-CM | POA: Insufficient documentation

## 2020-06-04 DIAGNOSIS — F17210 Nicotine dependence, cigarettes, uncomplicated: Secondary | ICD-10-CM | POA: Insufficient documentation

## 2020-06-04 DIAGNOSIS — Y999 Unspecified external cause status: Secondary | ICD-10-CM | POA: Insufficient documentation

## 2020-06-04 DIAGNOSIS — S2232XA Fracture of one rib, left side, initial encounter for closed fracture: Secondary | ICD-10-CM | POA: Insufficient documentation

## 2020-06-04 DIAGNOSIS — W1789XA Other fall from one level to another, initial encounter: Secondary | ICD-10-CM | POA: Insufficient documentation

## 2020-06-04 DIAGNOSIS — I10 Essential (primary) hypertension: Secondary | ICD-10-CM | POA: Insufficient documentation

## 2020-06-04 DIAGNOSIS — J449 Chronic obstructive pulmonary disease, unspecified: Secondary | ICD-10-CM | POA: Insufficient documentation

## 2020-06-04 DIAGNOSIS — Y939 Activity, unspecified: Secondary | ICD-10-CM | POA: Insufficient documentation

## 2020-06-04 DIAGNOSIS — Z79899 Other long term (current) drug therapy: Secondary | ICD-10-CM | POA: Insufficient documentation

## 2020-06-04 DIAGNOSIS — Y929 Unspecified place or not applicable: Secondary | ICD-10-CM | POA: Insufficient documentation

## 2020-06-04 HISTORY — DX: Chronic obstructive pulmonary disease, unspecified: J44.9

## 2020-06-04 HISTORY — DX: Unspecified asthma, uncomplicated: J45.909

## 2020-06-04 LAB — COMPREHENSIVE METABOLIC PANEL
ALT: 18 U/L (ref 0–44)
AST: 21 U/L (ref 15–41)
Albumin: 3.6 g/dL (ref 3.5–5.0)
Alkaline Phosphatase: 81 U/L (ref 38–126)
Anion gap: 13 (ref 5–15)
BUN: 6 mg/dL (ref 6–20)
CO2: 24 mmol/L (ref 22–32)
Calcium: 8.9 mg/dL (ref 8.9–10.3)
Chloride: 97 mmol/L — ABNORMAL LOW (ref 98–111)
Creatinine, Ser: 1.02 mg/dL (ref 0.61–1.24)
GFR calc Af Amer: >60 mL/min (ref >60)
GFR calc non Af Amer: >60 mL/min (ref >60)
Glucose, Bld: 100 mg/dL — ABNORMAL HIGH (ref 70–99)
Potassium: 3.9 mmol/L (ref 3.5–5.1)
Sodium: 134 mmol/L — ABNORMAL LOW (ref 135–145)
Total Bilirubin: 0.9 mg/dL (ref 0.3–1.2)
Total Protein: 9 g/dL — ABNORMAL HIGH (ref 6.5–8.1)

## 2020-06-04 LAB — CBC WITH DIFFERENTIAL/PLATELET
Abs Immature Granulocytes: 0.05 10*3/uL (ref 0.00–0.07)
Basophils Absolute: 0 10*3/uL (ref 0.0–0.1)
Basophils Relative: 1 %
Eosinophils Absolute: 0.1 10*3/uL (ref 0.0–0.5)
Eosinophils Relative: 1 %
HCT: 40 % (ref 39.0–52.0)
Hemoglobin: 13.4 g/dL (ref 13.0–17.0)
Immature Granulocytes: 1 %
Lymphocytes Relative: 22 %
Lymphs Abs: 1.4 10*3/uL (ref 0.7–4.0)
MCH: 32.2 pg (ref 26.0–34.0)
MCHC: 33.5 g/dL (ref 30.0–36.0)
MCV: 96.2 fL (ref 80.0–100.0)
Monocytes Absolute: 0.8 10*3/uL (ref 0.1–1.0)
Monocytes Relative: 13 %
Neutro Abs: 4 10*3/uL (ref 1.7–7.7)
Neutrophils Relative %: 62 %
Platelets: 330 10*3/uL (ref 150–400)
RBC: 4.16 MIL/uL — ABNORMAL LOW (ref 4.22–5.81)
RDW: 12.8 % (ref 11.5–15.5)
WBC: 6.3 10*3/uL (ref 4.0–10.5)
nRBC: 0 % (ref 0.0–0.2)

## 2020-06-04 LAB — TROPONIN I (HIGH SENSITIVITY): Troponin I (High Sensitivity): 4 ng/L (ref <18)

## 2020-06-04 IMAGING — CT CT ABD-PELV W/ CM
2 of 5 series · 16 of 46 positions shown, 18 images · IV contrast (APPLIED)
Comparison: None.

CLINICAL DATA: Fall from porch. Chest trauma and abdominal trauma.
RIGHT flank pain.

EXAM:
CT CHEST, ABDOMEN, AND PELVIS WITH CONTRAST
TECHNIQUE: Multidetector CT imaging of the chest, abdomen and pelvis was
performed following the standard protocol during bolus
administration of intravenous contrast.
CONTRAST:  100mL OMNIPAQUE IOHEXOL 350 MG/ML SOLN, 50mL OMNIPAQUE
IOHEXOL 300 MG/ML SOLN

[Series 2: routine abd/pel with · axial · 0.86mm/px · z∈[-506,-56]mm · 13 of 104 slices shown, 15 images]
[im 7/104  soft-tissue]
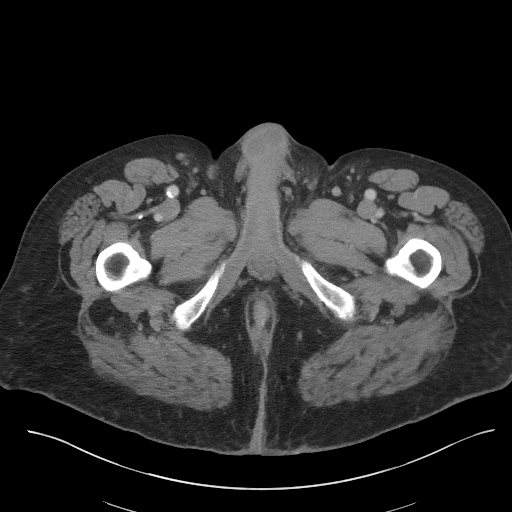
[im 7/104  bone]
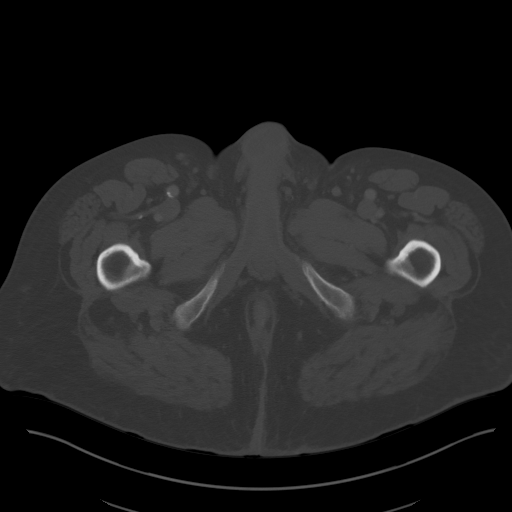
[im 13/104  soft-tissue]
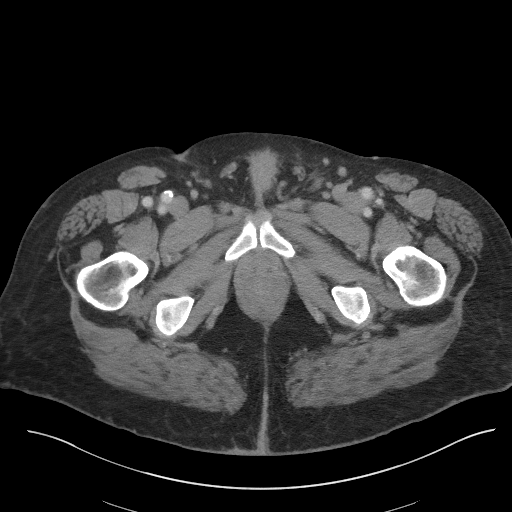
[im 25/104  soft-tissue]
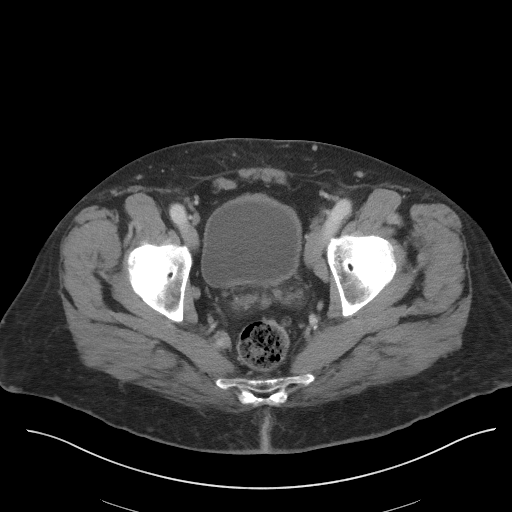
[im 31/104  soft-tissue]
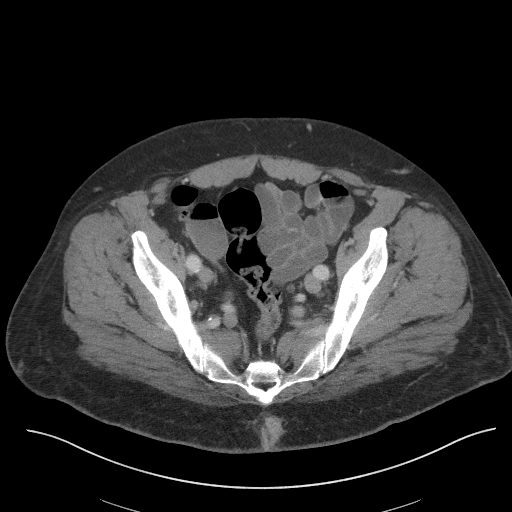
[im 37/104  soft-tissue]
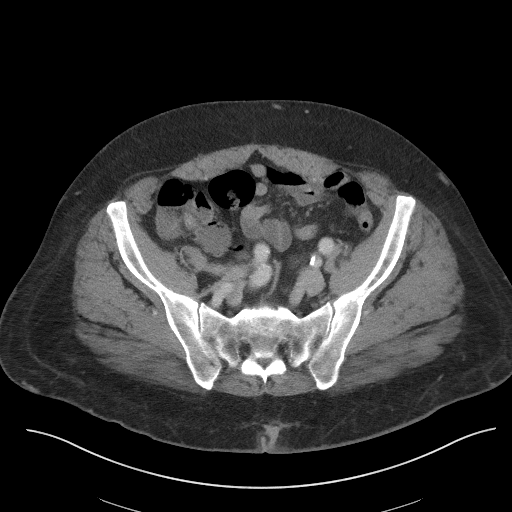
[im 43/104  soft-tissue]
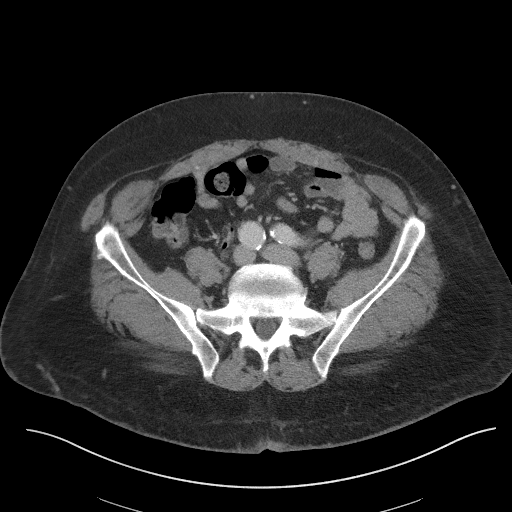
[im 55/104  soft-tissue]
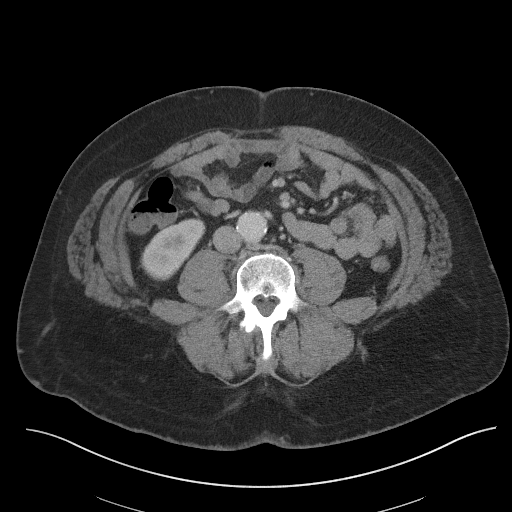
[im 61/104  soft-tissue]
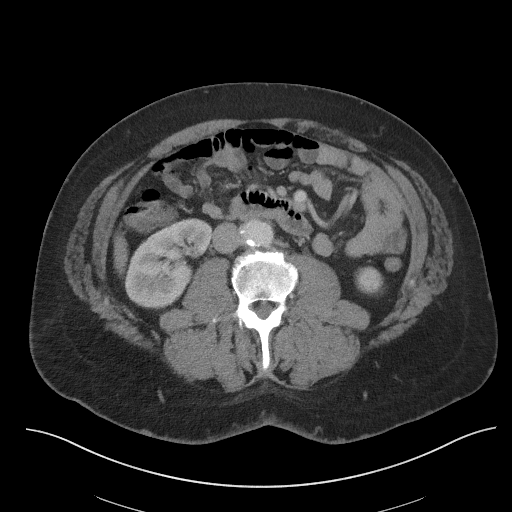
[im 67/104  soft-tissue]
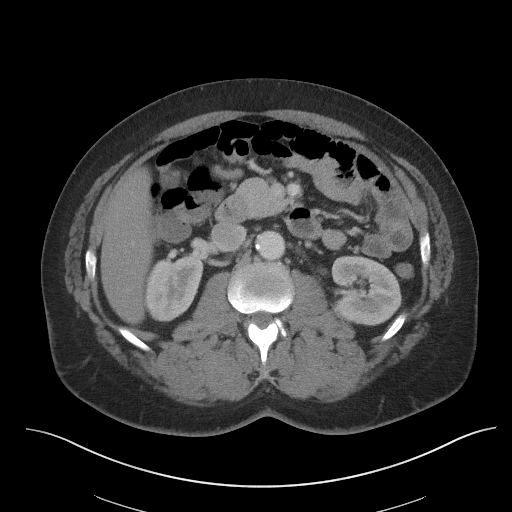
[im 67/104  bone]
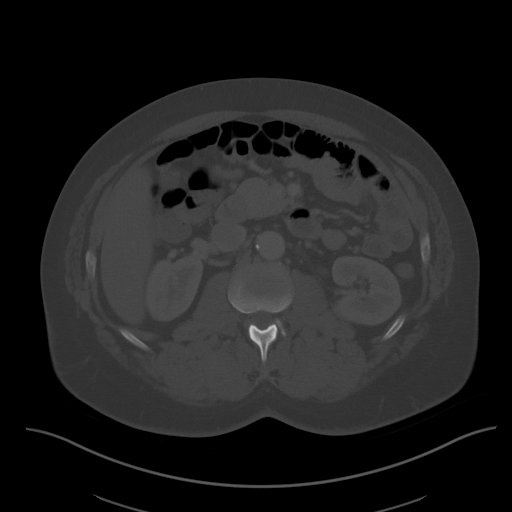
[im 73/104  soft-tissue]
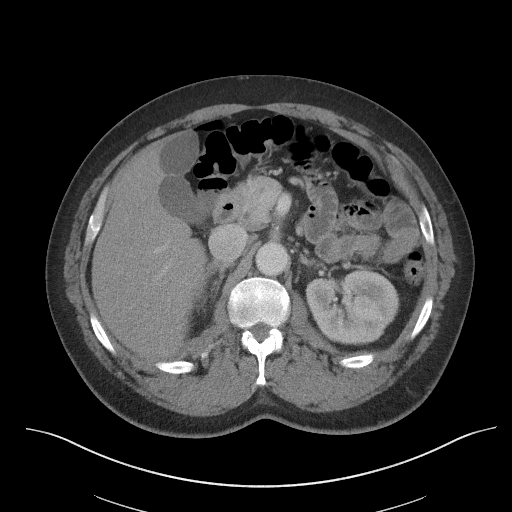
[im 79/104  soft-tissue]
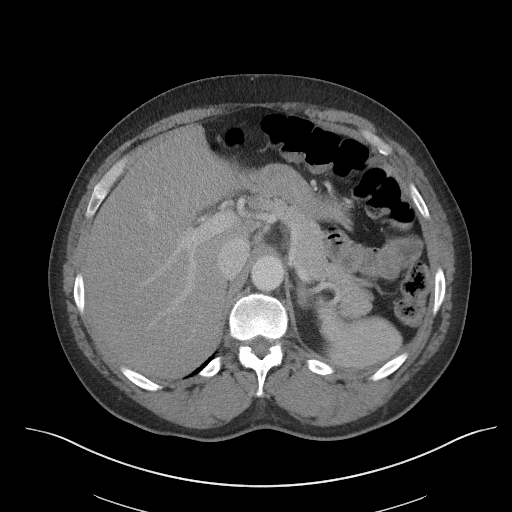
[im 91/104  soft-tissue]
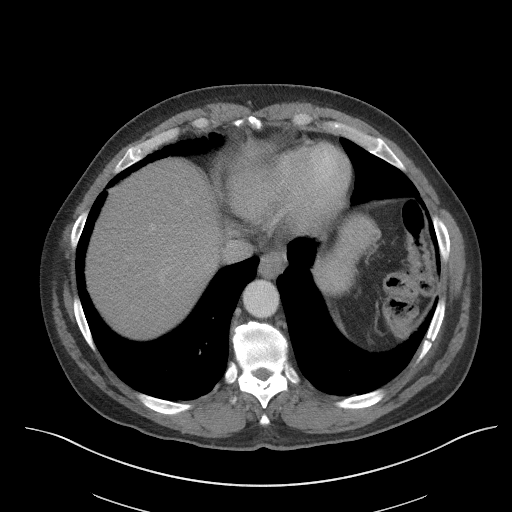
[im 97/104  soft-tissue]
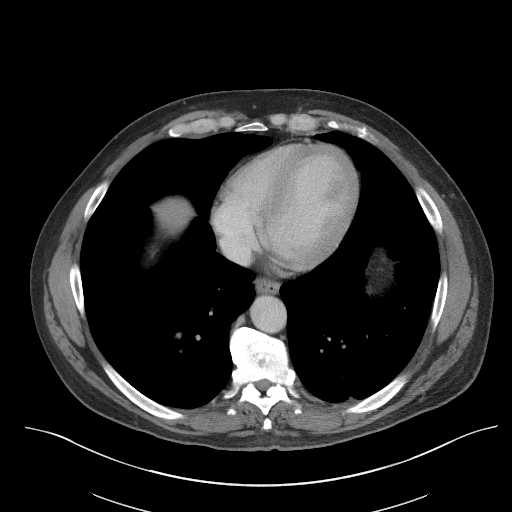

[Series 5: coronal st · coronal · 0.84mm/px · 3 of 110 slices shown]
[im 37/110  soft-tissue]
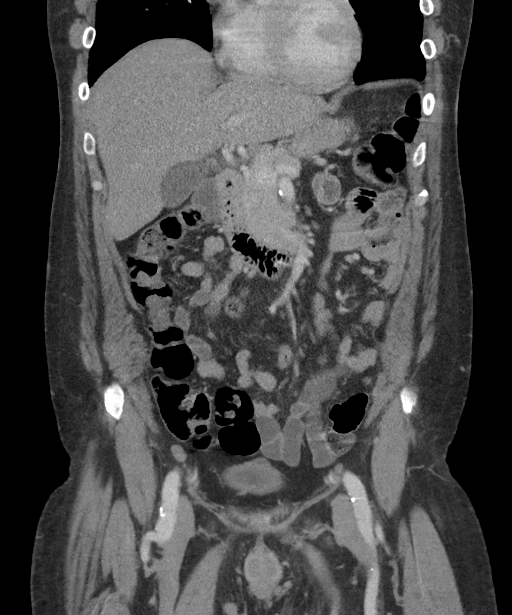
[im 49/110  soft-tissue]
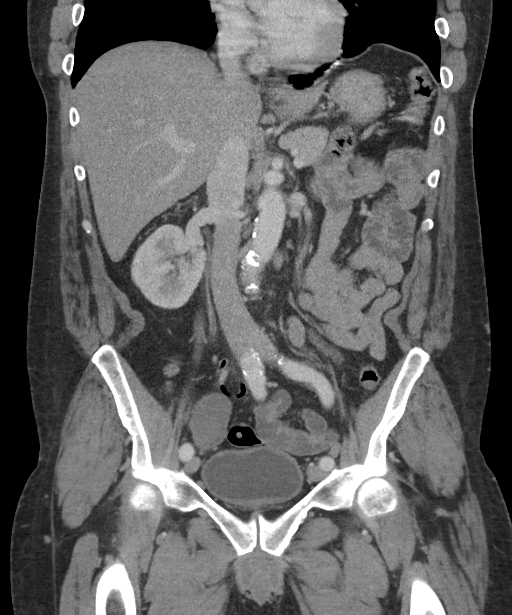
[im 61/110  soft-tissue]
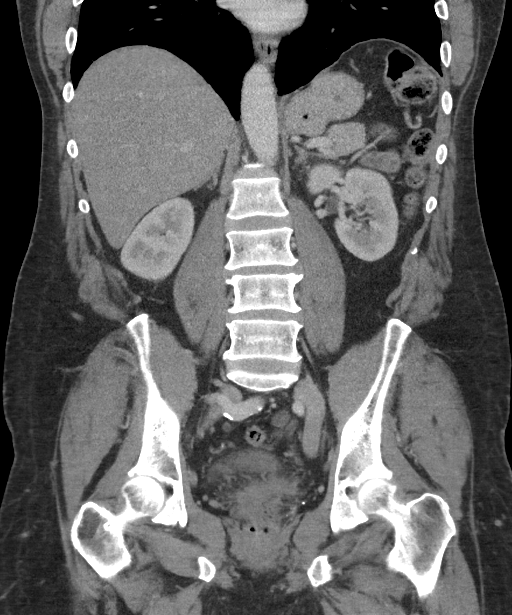

[16 of 46 positions shown; findings below may reference images not displayed]

FINDINGS: CT CHEST FINDINGS

Cardiovascular: No contour abnormality of the thoracic aorta to
suggest dissection or transsection. No pericardial fluid.

Mediastinum/Nodes: No mediastinal hematoma. Trachea and esophagus
normal.

Lungs/Pleura: No pneumothorax or pulmonary contusion.

Musculoskeletal: Fracture of the posterolateral LEFT ninth rib
(image [DATE]). No displacement. No pleural fluid adjacent to the
fracture.

Fragmentation at the RIGHT acromioclavicular joint consistent remote
trauma.

CT ABDOMEN AND PELVIS FINDINGS

Hepatobiliary: No hepatic laceration.  Gallbladder intact

Pancreas: The pancreas enhance uniformly.

Spleen: No splenic laceration.  No perisplenic fluid.

Adrenals/urinary tract: Adrenal glands normal. Kidneys enhance
symmetrically. Bladder is intact. Delayed imaging demonstrates no
proximal ureteral injury.

Stomach/Bowel: Stomach, small bowel, appendix, and cecum are normal.
The colon and rectosigmoid colon are normal.

Vascular/Lymphatic: Abdominal aorta is normal caliber. Intimal
calcification of the aorta. No evidence of injury. There is no
retroperitoneal or periportal lymphadenopathy. No pelvic
lymphadenopathy.

There is a enlarged medial inguinal lymph node on the LEFT measuring
18 mm image 95/2. This at the base of the penis.

Reproductive: Prostate unremarkable

Other: No free fluid.

Musculoskeletal: No pelvic fracture no spine fracture
IMPRESSION: Chest Impression:

1. LEFT ninth rib fracture.
2. No pneumothorax or pulmonary contusion.
3. No aortic injury.
4. Remote trauma to the RIGHT AC joint.

Abdomen / Pelvis Impression:

1. No evidence of blunt trauma to the abdomen pelvis.
2. No pelvic fracture or spine fracture.
3. Enlarged LEFT inguinal lymph node adjacent to the base of the
penis is favored reactive.

## 2020-06-04 IMAGING — CR DG RIBS W/ CHEST 3+V*L*
4 series · 5 of 5 positions shown · non-contrast
Comparison: [DATE].

CLINICAL DATA: Left rib pain after fall yesterday.

EXAM:
LEFT RIBS AND CHEST - 3+ VIEW

[chest pa]
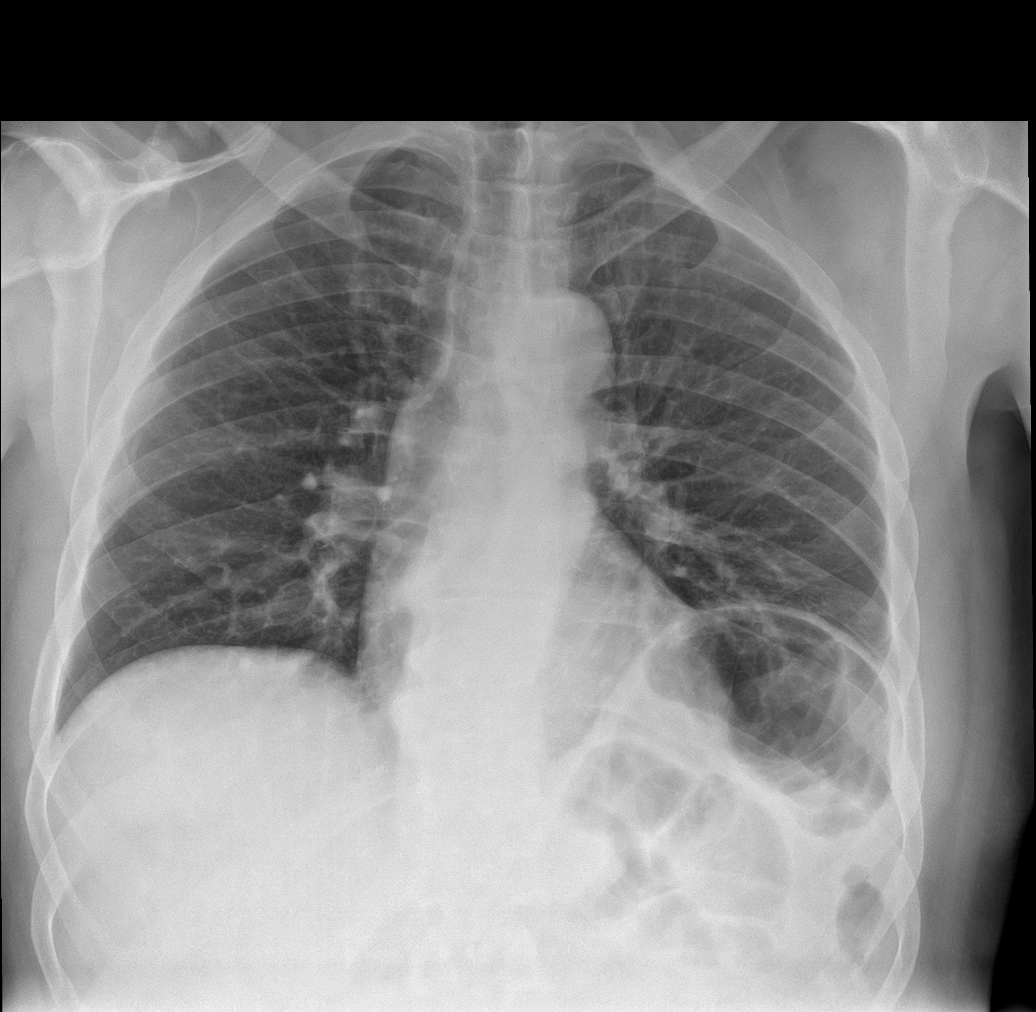

[Series 4: rib ap · 0.14mm/px · 2 of 2 slices shown]
[im 1/2]
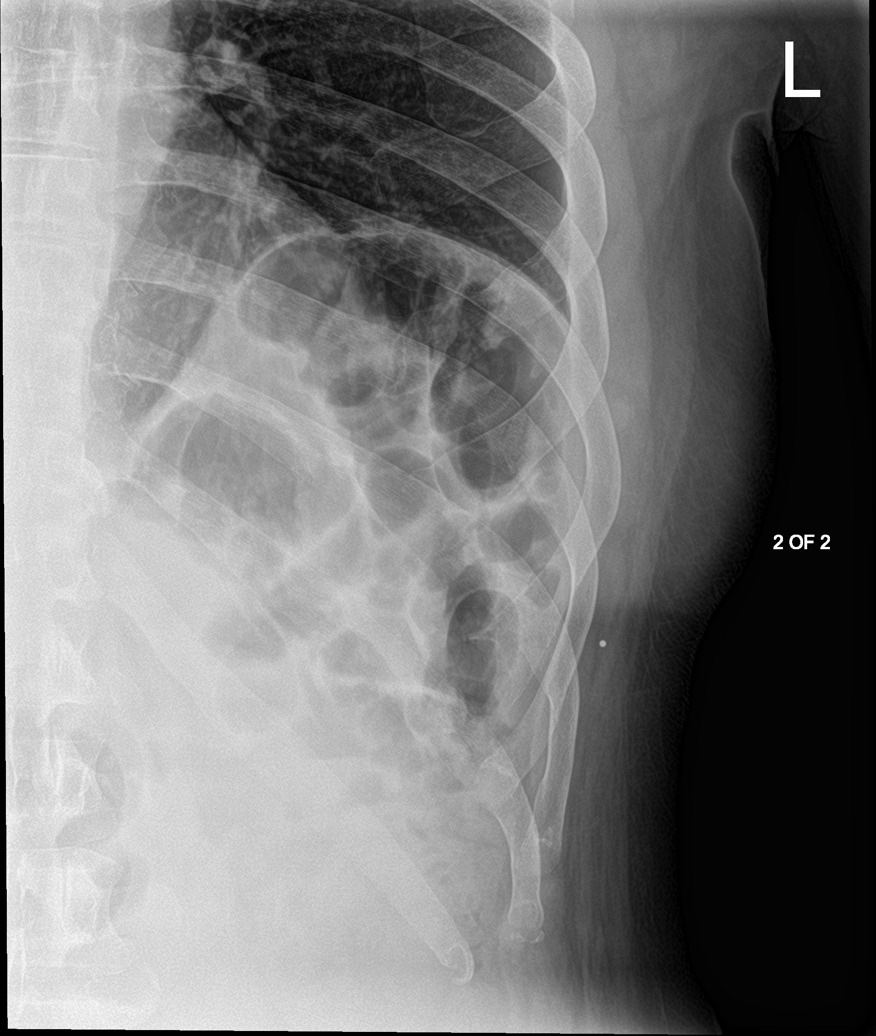
[im 2/2]
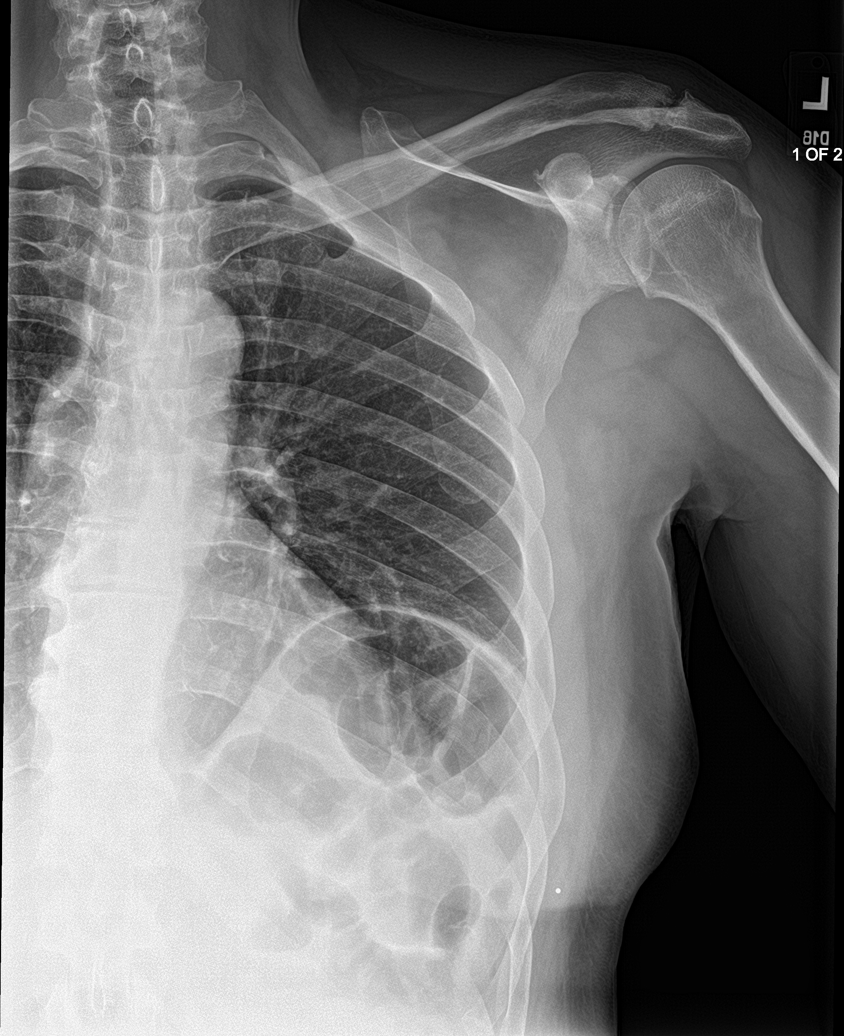

[rib ap obl (1 of 2)]
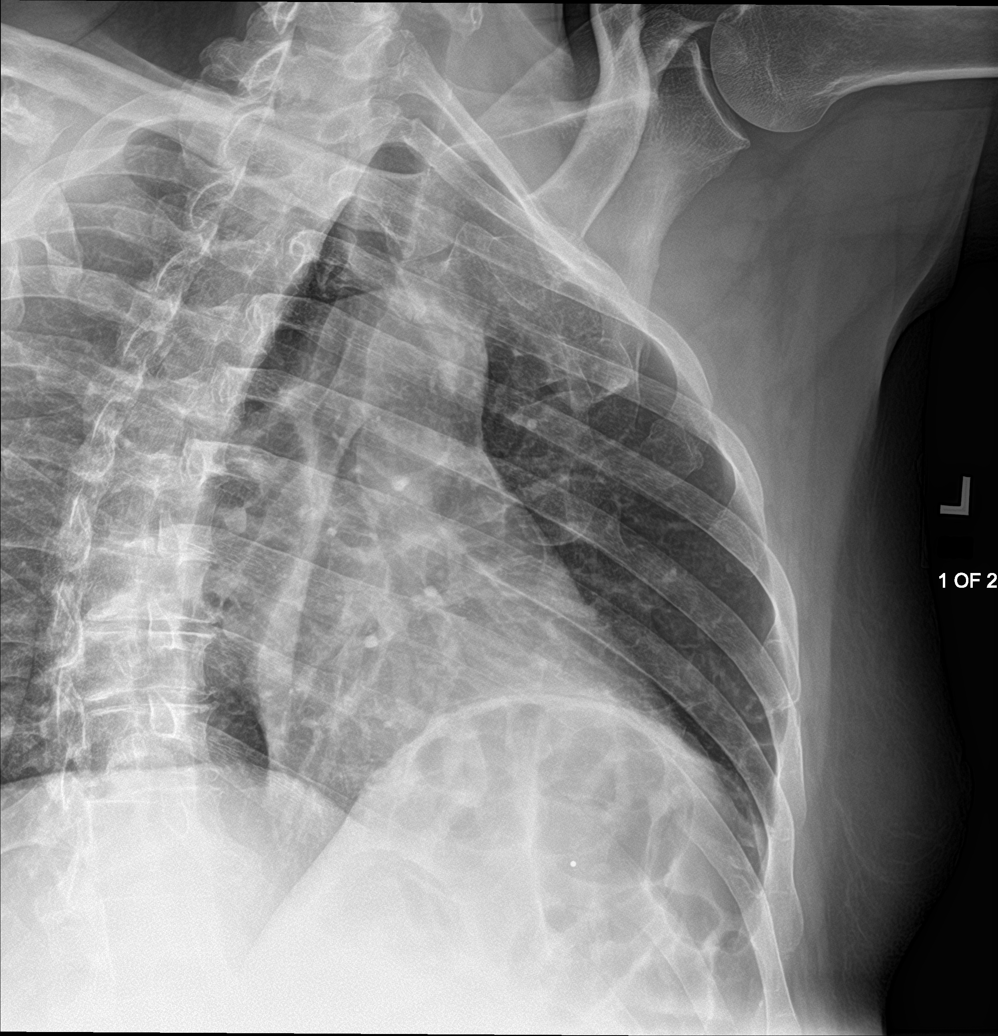

[rib ap obl (2 of 2)]
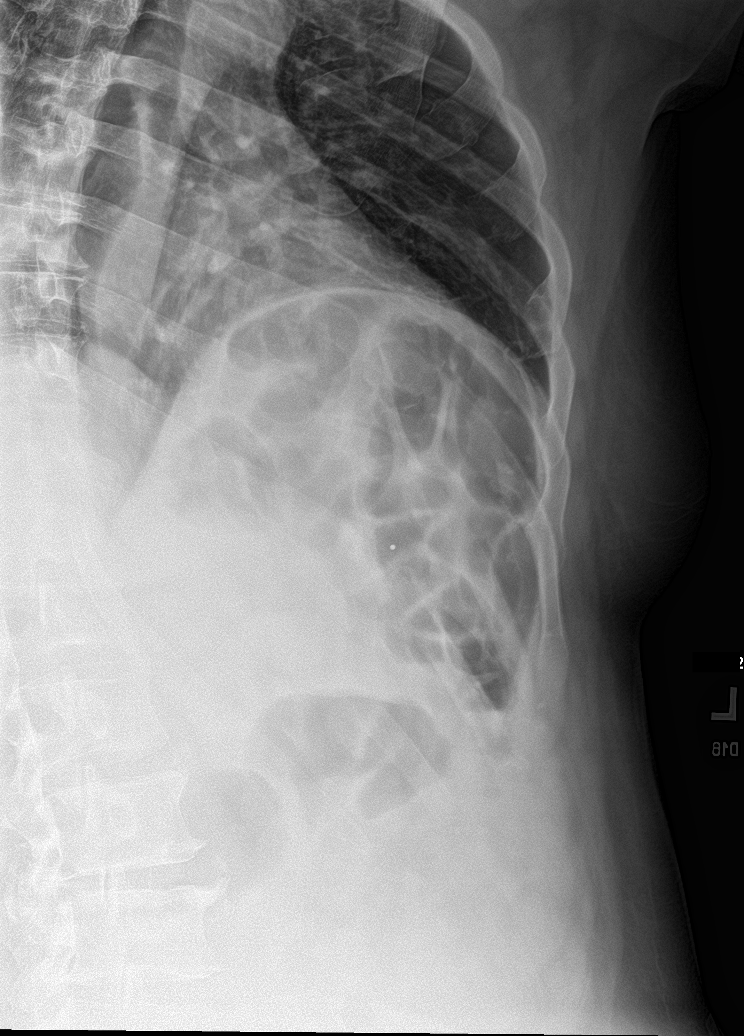

[5 of 5 positions shown; findings below may reference images not displayed]

FINDINGS: No fracture or other bone lesions are seen involving the ribs. There
is no evidence of pneumothorax or pleural effusion. Both lungs are
clear. Heart size and mediastinal contours are within normal limits.
IMPRESSION: Negative.

## 2020-06-04 IMAGING — CT CT CHEST W/ CM
2 of 3 series · 15 of 36 positions shown, 18 images · IV contrast (omnipaque)
Comparison: None.

CLINICAL DATA: Fall from porch. Chest trauma and abdominal trauma.
RIGHT flank pain.

EXAM:
CT CHEST, ABDOMEN, AND PELVIS WITH CONTRAST
TECHNIQUE: Multidetector CT imaging of the chest, abdomen and pelvis was
performed following the standard protocol during bolus
administration of intravenous contrast.
CONTRAST:  100mL OMNIPAQUE IOHEXOL 350 MG/ML SOLN, 50mL OMNIPAQUE
IOHEXOL 300 MG/ML SOLN

[Series 2: axial st · axial · 0.76mm/px · z∈[-436,-140]mm · 12 of 174 slices shown, 15 images]
[im 13/174  mediastinal]
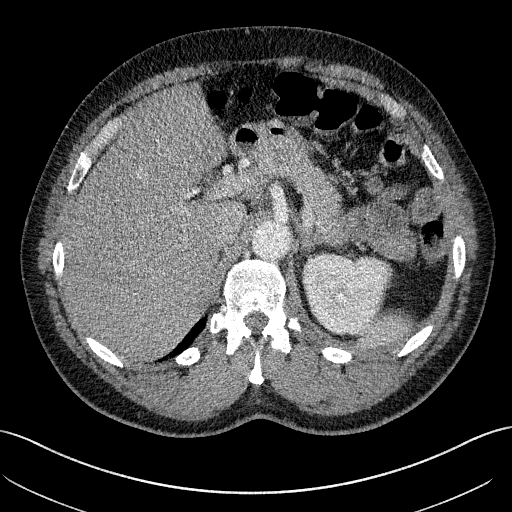
[im 13/174  lung]
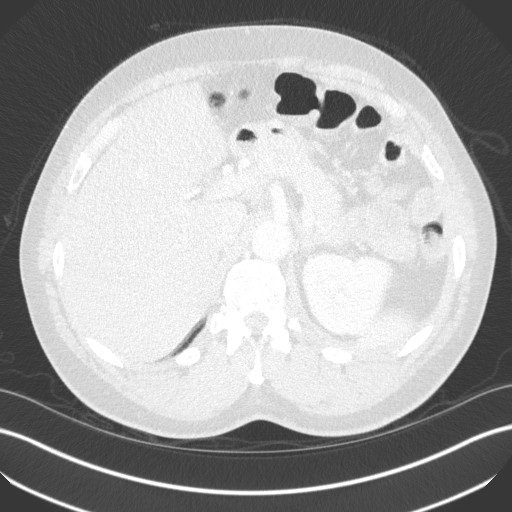
[im 26/174  lung]
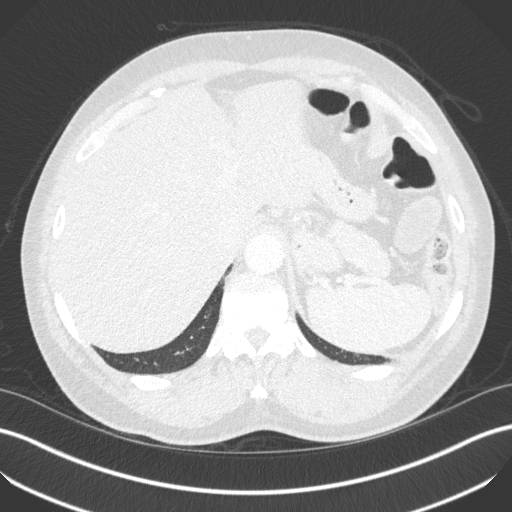
[im 39/174  lung]
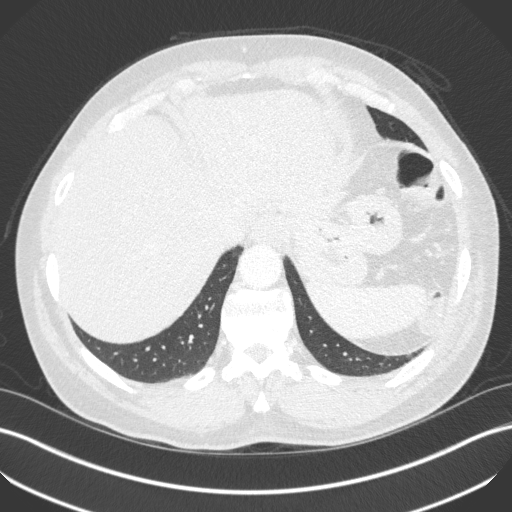
[im 52/174  lung]
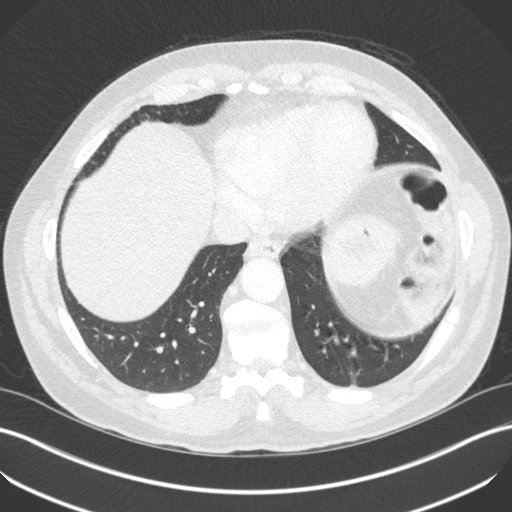
[im 65/174  mediastinal]
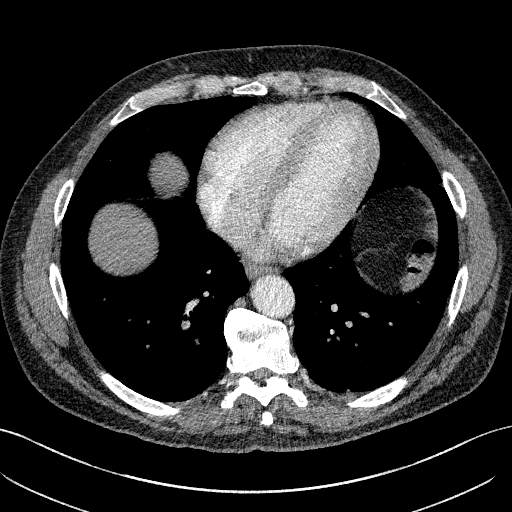
[im 65/174  lung]
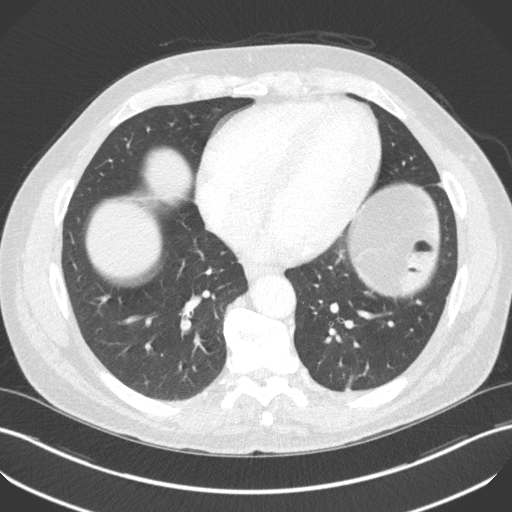
[im 77/174  lung]
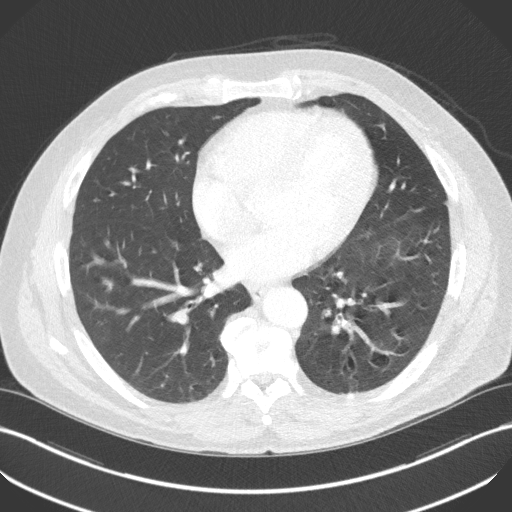
[im 97/174  lung]
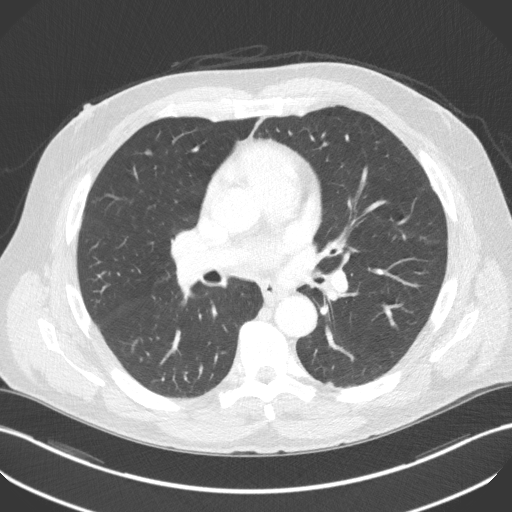
[im 109/174  lung]
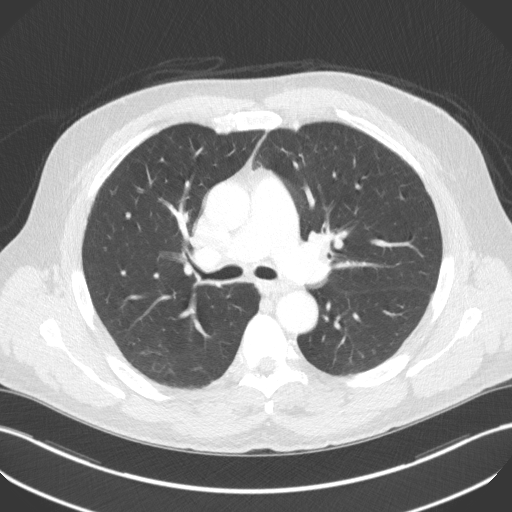
[im 122/174  mediastinal]
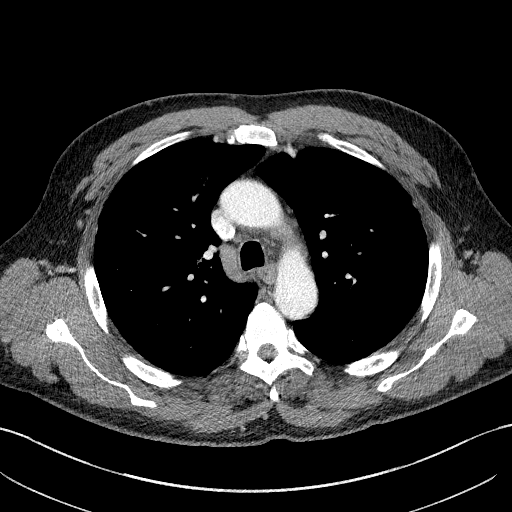
[im 122/174  lung]
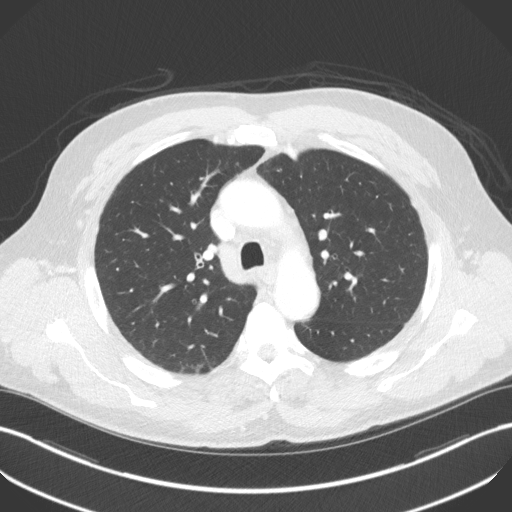
[im 135/174  lung]
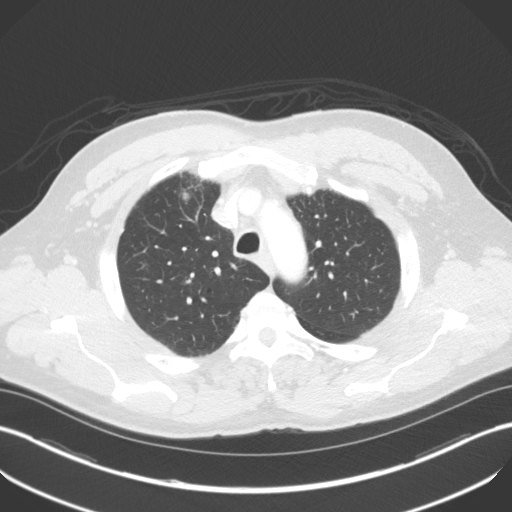
[im 148/174  lung]
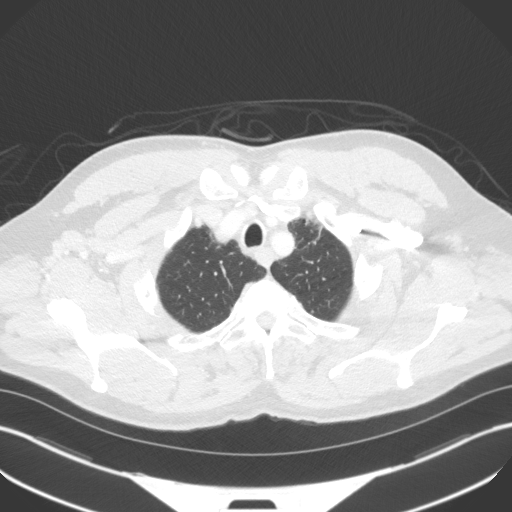
[im 161/174  lung]
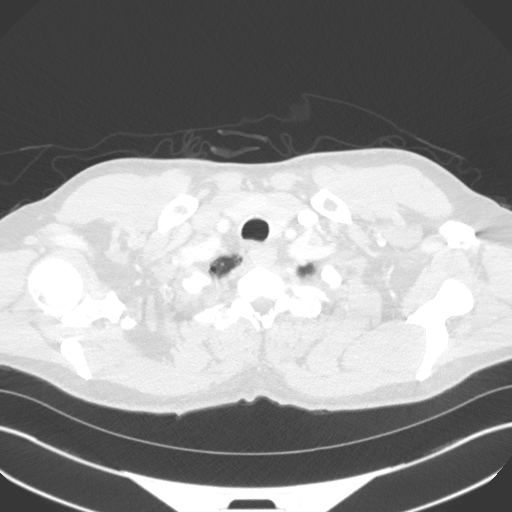

[Series 5: coronal · coronal · 0.70mm/px · 3 of 165 slices shown]
[im 33/165  lung]
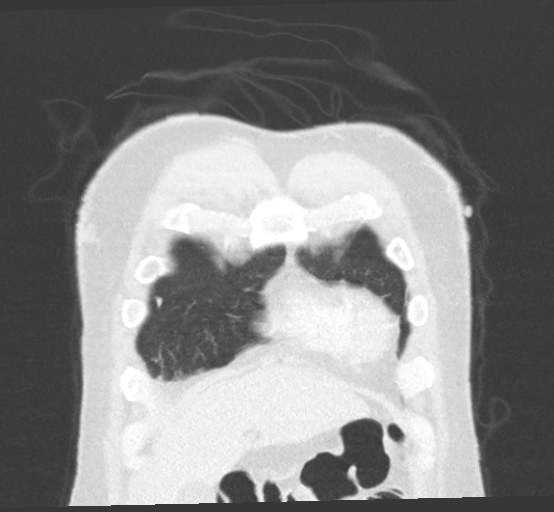
[im 66/165  lung]
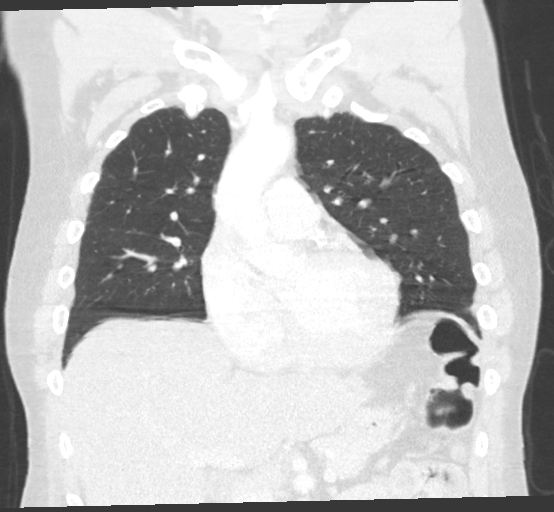
[im 99/165  lung]
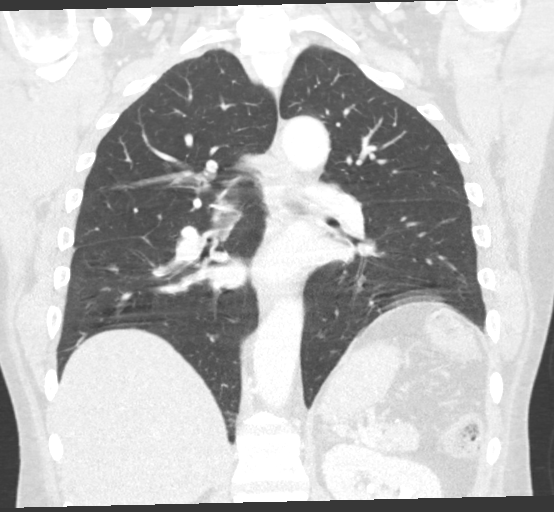

[15 of 36 positions shown; findings below may reference images not displayed]

FINDINGS: CT CHEST FINDINGS

Cardiovascular: No contour abnormality of the thoracic aorta to
suggest dissection or transsection. No pericardial fluid.

Mediastinum/Nodes: No mediastinal hematoma. Trachea and esophagus
normal.

Lungs/Pleura: No pneumothorax or pulmonary contusion.

Musculoskeletal: Fracture of the posterolateral LEFT ninth rib
(image [DATE]). No displacement. No pleural fluid adjacent to the
fracture.

Fragmentation at the RIGHT acromioclavicular joint consistent remote
trauma.

CT ABDOMEN AND PELVIS FINDINGS

Hepatobiliary: No hepatic laceration.  Gallbladder intact

Pancreas: The pancreas enhance uniformly.

Spleen: No splenic laceration.  No perisplenic fluid.

Adrenals/urinary tract: Adrenal glands normal. Kidneys enhance
symmetrically. Bladder is intact. Delayed imaging demonstrates no
proximal ureteral injury.

Stomach/Bowel: Stomach, small bowel, appendix, and cecum are normal.
The colon and rectosigmoid colon are normal.

Vascular/Lymphatic: Abdominal aorta is normal caliber. Intimal
calcification of the aorta. No evidence of injury. There is no
retroperitoneal or periportal lymphadenopathy. No pelvic
lymphadenopathy.

There is a enlarged medial inguinal lymph node on the LEFT measuring
18 mm image 95/2. This at the base of the penis.

Reproductive: Prostate unremarkable

Other: No free fluid.

Musculoskeletal: No pelvic fracture no spine fracture
IMPRESSION: Chest Impression:

1. LEFT ninth rib fracture.
2. No pneumothorax or pulmonary contusion.
3. No aortic injury.
4. Remote trauma to the RIGHT AC joint.

Abdomen / Pelvis Impression:

1. No evidence of blunt trauma to the abdomen pelvis.
2. No pelvic fracture or spine fracture.
3. Enlarged LEFT inguinal lymph node adjacent to the base of the
penis is favored reactive.

## 2020-06-04 MED ORDER — MORPHINE SULFATE (PF) 4 MG/ML IV SOLN
4.0000 mg | Freq: Once | INTRAVENOUS | Status: AC
  Administered 2020-06-04: 4 mg via INTRAVENOUS
  Filled 2020-06-04: qty 1

## 2020-06-04 MED ORDER — ONDANSETRON 4 MG PO TBDP
4.0000 mg | ORAL_TABLET | Freq: Three times a day (TID) | ORAL | 0 refills | Status: AC | PRN
Start: 2020-06-04 — End: 2020-06-08

## 2020-06-04 MED ORDER — IOHEXOL 350 MG/ML SOLN
100.0000 mL | Freq: Once | INTRAVENOUS | Status: AC | PRN
  Administered 2020-06-04: 100 mL via INTRAVENOUS
  Filled 2020-06-04: qty 100

## 2020-06-04 MED ORDER — OXYCODONE-ACETAMINOPHEN 5-325 MG PO TABS
1.0000 | ORAL_TABLET | Freq: Once | ORAL | Status: AC
  Administered 2020-06-04: 1 via ORAL
  Filled 2020-06-04: qty 1

## 2020-06-04 MED ORDER — IOHEXOL 300 MG/ML  SOLN
50.0000 mL | Freq: Once | INTRAMUSCULAR | Status: AC | PRN
  Administered 2020-06-04: 50 mL via INTRAVENOUS
  Filled 2020-06-04: qty 50

## 2020-06-04 MED ORDER — ONDANSETRON HCL 4 MG/2ML IJ SOLN
4.0000 mg | Freq: Once | INTRAMUSCULAR | Status: AC
  Administered 2020-06-04: 4 mg via INTRAVENOUS
  Filled 2020-06-04: qty 2

## 2020-06-04 MED ORDER — ONDANSETRON 4 MG PO TBDP
4.0000 mg | ORAL_TABLET | Freq: Once | ORAL | Status: AC
  Administered 2020-06-04: 4 mg via ORAL
  Filled 2020-06-04: qty 1

## 2020-06-04 MED ORDER — HYDROCODONE-ACETAMINOPHEN 5-325 MG PO TABS: 1.0000 | ORAL_TABLET | Freq: Four times a day (QID) | ORAL | 0 refills | Status: AC | PRN

## 2020-06-04 NOTE — ED Notes (Signed)
See triage note  Presents with left sided rib pain  S/p fall states he tripped and fell

## 2020-06-04 NOTE — ED Provider Notes (Signed)
Emergency Department Provider Note  ____________________________________________  Time seen: Approximately 3:24 PM  I have reviewed the triage vital signs and the nursing notes.   HISTORY  Chief Complaint Chest Pain   Historian Patient     HPI James Lambert is a 54 y.o. male with a history of hypertension, COPD and asthma, presents to the emergency department with left-sided chest pain.  Patient states that pain occurred immediately after he had a mechanical fall down one step yesterday.  He states that he landed on his left side.  Patient states that it does hurt when he takes a deep breath and feels better when he is in a supine position.  He denies shortness of breath.  Patient states that he has taken over-the-counter pain relievers with no relief.   Past Medical History:  Diagnosis Date  . Asthma   . COPD (chronic obstructive pulmonary disease) (HCC)   . Hypertension      Immunizations up to date:  Yes.     Past Medical History:  Diagnosis Date  . Asthma   . COPD (chronic obstructive pulmonary disease) (HCC)   . Hypertension     There are no problems to display for this patient.   History reviewed. No pertinent surgical history.  Prior to Admission medications   Medication Sig Start Date End Date Taking? Authorizing Provider  acetaminophen (TYLENOL) 325 MG tablet Take 650 mg by mouth every 4 (four) hours as needed. 07/01/16   [provider]  amLODipine (NORVASC) 5 MG tablet Take 1 tablet (5 mg total) by mouth daily. 01/03/20   Irean Hong, MD  HYDROcodone-acetaminophen (NORCO) 5-325 MG tablet Take 1 tablet by mouth every 6 (six) hours as needed for up to 3 days for moderate pain. 06/04/20 06/07/20  Orvil Feil, PA-C  ibuprofen (ADVIL) 200 MG tablet Take 200 mg by mouth every 6 (six) hours as needed.    [provider]  ondansetron (ZOFRAN ODT) 4 MG disintegrating tablet Take 1 tablet (4 mg total) by mouth every 8 (eight) hours as needed  for up to 4 days for nausea or vomiting. 06/04/20 06/08/20  Orvil Feil, PA-C    Allergies Patient has no known allergies.  No family history on file.  Social History Social History   Tobacco Use  . Smoking status: Current Every Day Smoker    Packs/day: 0.50    Types: Cigarettes  . Smokeless tobacco: Never Used  Substance Use Topics  . Alcohol use: Yes    Comment: daily  . Drug use: Yes    Types: Marijuana     Review of Systems  Constitutional: No fever/chills Eyes:  No discharge ENT: No upper respiratory complaints. Respiratory: no cough. No SOB/ use of accessory muscles to breath Cardiovascular: Patient has left sided chest pain.  Gastrointestinal:   No nausea, no vomiting.  No diarrhea.  No constipation. Musculoskeletal: Negative for musculoskeletal pain. Skin: Negative for rash, abrasions, lacerations, ecchymosis.   ____________________________________________   PHYSICAL EXAM:  VITAL SIGNS: ED Triage Vitals  Enc Vitals Group     BP 06/04/20 1201 (!) 201/117     Pulse Rate 06/04/20 1201 74     Resp 06/04/20 1201 16     Temp 06/04/20 1201 98.4 F (36.9 C)     Temp Source 06/04/20 1201 Oral     SpO2 06/04/20 1201 98 %     Weight 06/04/20 1202 255 lb (115.7 kg)     Height 06/04/20 1202 6\' 2"  (  1.88 m)     Head Circumference --      Peak Flow --      Pain Score 06/04/20 1201 10     Pain Loc --      Pain Edu? --      Excl. in GC? --      Constitutional: Alert and oriented. Well appearing and in no acute distress. Eyes: Conjunctivae are normal. PERRL. EOMI. Head: Atraumatic. ENT:      Nose: No congestion/rhinnorhea.      Mouth/Throat: Mucous membranes are moist.  Neck: No stridor.  No cervical spine tenderness to palpation.  Cardiovascular: Normal rate, regular rhythm. Normal S1 and S2.  Good peripheral circulation.  Patient has reproducible left-sided chest wall pain to palpation. Respiratory: Normal respiratory effort without tachypnea or  retractions. Lungs CTAB. Good air entry to the bases with no decreased or absent breath sounds Gastrointestinal: Bowel sounds x 4 quadrants.  Patient has left upper quadrant abdominal pain to palpation.  No distention. Musculoskeletal: Full range of motion to all extremities. No obvious deformities noted Neurologic:  Normal for age. No gross focal neurologic deficits are appreciated.  Skin:  Skin is warm, dry and intact. No rash noted. Psychiatric: Mood and affect are normal for age. Speech and behavior are normal.   ____________________________________________   LABS (all labs ordered are listed, but only abnormal results are displayed)  Labs Reviewed  CBC WITH DIFFERENTIAL/PLATELET - Abnormal; Notable for the following components:      Result Value   RBC 4.16 (*)    All other components within normal limits  COMPREHENSIVE METABOLIC PANEL - Abnormal; Notable for the following components:   Sodium 134 (*)    Chloride 97 (*)    Glucose, Bld 100 (*)    Total Protein 9.0 (*)    All other components within normal limits  TROPONIN I (HIGH SENSITIVITY)   ____________________________________________  EKG   ____________________________________________  RADIOLOGY Geraldo PitterI, Ryllie Nieland M Nima Kemppainen, personally viewed and evaluated these images (plain radiographs) as part of my medical decision making, as well as reviewing the written report by the radiologist.  DG Ribs Unilateral W/Chest Left  Result Date: 06/04/2020 CLINICAL DATA:  Left rib pain after fall yesterday. EXAM: LEFT RIBS AND CHEST - 3+ VIEW COMPARISON:  January 02, 2020. FINDINGS: No fracture or other bone lesions are seen involving the ribs. There is no evidence of pneumothorax or pleural effusion. Both lungs are clear. Heart size and mediastinal contours are within normal limits. IMPRESSION: Negative. Electronically Signed   By: Lupita RaiderJames  Green Jr M.D.   On: 06/04/2020 13:27   CT Chest W Contrast  Result Date: 06/04/2020 CLINICAL DATA:   Fall from porch. Chest trauma and abdominal trauma. RIGHT flank pain. EXAM: CT CHEST, ABDOMEN, AND PELVIS WITH CONTRAST TECHNIQUE: Multidetector CT imaging of the chest, abdomen and pelvis was performed following the standard protocol during bolus administration of intravenous contrast. CONTRAST:  100mL OMNIPAQUE IOHEXOL 350 MG/ML SOLN, 50mL OMNIPAQUE IOHEXOL 300 MG/ML SOLN COMPARISON:  None. FINDINGS: CT CHEST FINDINGS Cardiovascular: No contour abnormality of the thoracic aorta to suggest dissection or transsection. No pericardial fluid. Mediastinum/Nodes: No mediastinal hematoma. Trachea and esophagus normal. Lungs/Pleura: No pneumothorax or pulmonary contusion. Musculoskeletal: Fracture of the posterolateral LEFT ninth rib (image 13/2). No displacement. No pleural fluid adjacent to the fracture. Fragmentation at the RIGHT acromioclavicular joint consistent remote trauma. CT ABDOMEN AND PELVIS FINDINGS Hepatobiliary: No hepatic laceration.  Gallbladder intact Pancreas: The pancreas enhance uniformly. Spleen: No splenic laceration.  No perisplenic fluid. Adrenals/urinary tract: Adrenal glands normal. Kidneys enhance symmetrically. Bladder is intact. Delayed imaging demonstrates no proximal ureteral injury. Stomach/Bowel: Stomach, small bowel, appendix, and cecum are normal. The colon and rectosigmoid colon are normal. Vascular/Lymphatic: Abdominal aorta is normal caliber. Intimal calcification of the aorta. No evidence of injury. There is no retroperitoneal or periportal lymphadenopathy. No pelvic lymphadenopathy. There is a enlarged medial inguinal lymph node on the LEFT measuring 18 mm image 95/2. This at the base of the penis. Reproductive: Prostate unremarkable Other: No free fluid. Musculoskeletal: No pelvic fracture no spine fracture IMPRESSION: Chest Impression: 1. LEFT ninth rib fracture. 2. No pneumothorax or pulmonary contusion. 3. No aortic injury. 4. Remote trauma to the RIGHT AC joint. Abdomen /  Pelvis Impression: 1. No evidence of blunt trauma to the abdomen pelvis. 2. No pelvic fracture or spine fracture. 3. Enlarged LEFT inguinal lymph node adjacent to the base of the penis is favored reactive. Electronically Signed   By: Genevive Bi M.D.   On: 06/04/2020 17:46   CT ABDOMEN PELVIS W CONTRAST  Result Date: 06/04/2020 CLINICAL DATA:  Fall from porch. Chest trauma and abdominal trauma. RIGHT flank pain. EXAM: CT CHEST, ABDOMEN, AND PELVIS WITH CONTRAST TECHNIQUE: Multidetector CT imaging of the chest, abdomen and pelvis was performed following the standard protocol during bolus administration of intravenous contrast. CONTRAST:  OMNIPAQUE IOHEXOL 350 MG/ML SOLN, 74mL OMNIPAQUE IOHEXOL 300 MG/ML SOLN COMPARISON:  None. FINDINGS: CT CHEST FINDINGS Cardiovascular: No contour abnormality of the thoracic aorta to suggest dissection or transsection. No pericardial fluid. Mediastinum/Nodes: No mediastinal hematoma. Trachea and esophagus normal. Lungs/Pleura: No pneumothorax or pulmonary contusion. Musculoskeletal: Fracture of the posterolateral LEFT ninth rib (image 13/2). No displacement. No pleural fluid adjacent to the fracture. Fragmentation at the RIGHT acromioclavicular joint consistent remote trauma. CT ABDOMEN AND PELVIS FINDINGS Hepatobiliary: No hepatic laceration.  Gallbladder intact Pancreas: The pancreas enhance uniformly. Spleen: No splenic laceration.  No perisplenic fluid. Adrenals/urinary tract: Adrenal glands normal. Kidneys enhance symmetrically. Bladder is intact. Delayed imaging demonstrates no proximal ureteral injury. Stomach/Bowel: Stomach, small bowel, appendix, and cecum are normal. The colon and rectosigmoid colon are normal. Vascular/Lymphatic: Abdominal aorta is normal caliber. Intimal calcification of the aorta. No evidence of injury. There is no retroperitoneal or periportal lymphadenopathy. No pelvic lymphadenopathy. There is a enlarged medial inguinal lymph node on  the LEFT measuring 18 mm image 95/2. This at the base of the penis. Reproductive: Prostate unremarkable Other: No free fluid. Musculoskeletal: No pelvic fracture no spine fracture IMPRESSION: Chest Impression: 1. LEFT ninth rib fracture. 2. No pneumothorax or pulmonary contusion. 3. No aortic injury. 4. Remote trauma to the RIGHT AC joint. Abdomen / Pelvis Impression: 1. No evidence of blunt trauma to the abdomen pelvis. 2. No pelvic fracture or spine fracture. 3. Enlarged LEFT inguinal lymph node adjacent to the base of the penis is favored reactive. Electronically Signed   By: Genevive Bi M.D.   On: 06/04/2020 17:46    ____________________________________________    PROCEDURES  Procedure(s) performed:     Procedures     Medications  morphine 4 MG/ML injection 4 mg (4 mg Intravenous Given 06/04/20 1605)  ondansetron (ZOFRAN) injection 4 mg (4 mg Intravenous Given 06/04/20 1605)  iohexol (OMNIPAQUE) 350 MG/ML injection 100 mL (100 mLs Intravenous Contrast Given 06/04/20 1703)  iohexol (OMNIPAQUE) 300 MG/ML solution 50 mL (50 mLs Intravenous Contrast Given 06/04/20 1710)  oxyCODONE-acetaminophen (PERCOCET/ROXICET) 5-325 MG per tablet 1 tablet (1 tablet Oral Given 06/04/20  1807)  ondansetron (ZOFRAN-ODT) disintegrating tablet 4 mg (4 mg Oral Given 06/04/20 1807)     ____________________________________________   INITIAL IMPRESSION / ASSESSMENT AND PLAN / ED COURSE  Pertinent labs & imaging results that were available during my care of the patient were reviewed by me and considered in my medical decision making (see chart for details).    Assessment and Plan:  Chest wall pain 54 year old male presents to the emergency department with anterior chest wall pain after mechanical fall that occurred yesterday.  CBC and CMP were reassuring.  Troponin was within reference range.  EKG reveals normal sinus rhythm without ST segment elevation or apparent arrhythmia.  CT of the chest reveals  a nondisplaced ninth rib fracture.  No evidence of splenic laceration on CT abdomen pelvis.  Patient was discharged with a short course of Norco and Zofran.  Return precautions were given to return with new or worsening symptoms.  ____________________________________________  FINAL CLINICAL IMPRESSION(S) / ED DIAGNOSES  Final diagnoses:  Closed fracture of one rib of left side, initial encounter      NEW MEDICATIONS STARTED DURING THIS VISIT:  ED Discharge Orders         Ordered    HYDROcodone-acetaminophen (NORCO) 5-325 MG tablet  Every 6 hours PRN     Discontinue  Reprint     06/04/20 1822    ondansetron (ZOFRAN ODT) 4 MG disintegrating tablet  Every 8 hours PRN     Discontinue  Reprint     06/04/20 1822              This chart was dictated using voice recognition software/Dragon. Despite best efforts to proofread, errors can occur which can change the meaning. Any change was purely unintentional.     Orvil Feil, PA-C 06/04/20 1911    Sharman Cheek, MD 06/04/20 2059

## 2020-06-04 NOTE — ED Triage Notes (Signed)
Patient presents to the ED with left sided rib pain after a fall yesterday.  Patient states he tripped and fell.  Denies any head trauma or loss of consciousness.  Patient reports pain with movement and breathing.

## 2020-06-04 NOTE — ED Triage Notes (Signed)
Pt in via EMS from home with c/o fall yesterday and now left rib pain worse with inspiration  208/115

## 2020-11-17 ENCOUNTER — Other Ambulatory Visit: Payer: Self-pay

## 2020-11-17 ENCOUNTER — Emergency Department
Admission: EM | Admit: 2020-11-17 | Discharge: 2020-11-17 | Disposition: A | Discharge status: Home / Self Care | Payer: Self-pay | Attending: Emergency Medicine | Admitting: Emergency Medicine

## 2020-11-17 ENCOUNTER — Encounter: Payer: Self-pay | Admitting: Emergency Medicine

## 2020-11-17 ENCOUNTER — Emergency Department: Payer: Self-pay

## 2020-11-17 DIAGNOSIS — E119 Type 2 diabetes mellitus without complications: Secondary | ICD-10-CM | POA: Insufficient documentation

## 2020-11-17 DIAGNOSIS — I10 Essential (primary) hypertension: Secondary | ICD-10-CM | POA: Insufficient documentation

## 2020-11-17 DIAGNOSIS — Z20822 Contact with and (suspected) exposure to covid-19: Secondary | ICD-10-CM | POA: Insufficient documentation

## 2020-11-17 DIAGNOSIS — R079 Chest pain, unspecified: Secondary | ICD-10-CM

## 2020-11-17 DIAGNOSIS — R7989 Other specified abnormal findings of blood chemistry: Secondary | ICD-10-CM | POA: Insufficient documentation

## 2020-11-17 DIAGNOSIS — R072 Precordial pain: Secondary | ICD-10-CM | POA: Insufficient documentation

## 2020-11-17 DIAGNOSIS — J449 Chronic obstructive pulmonary disease, unspecified: Secondary | ICD-10-CM | POA: Insufficient documentation

## 2020-11-17 DIAGNOSIS — F17210 Nicotine dependence, cigarettes, uncomplicated: Secondary | ICD-10-CM | POA: Insufficient documentation

## 2020-11-17 DIAGNOSIS — Z79899 Other long term (current) drug therapy: Secondary | ICD-10-CM | POA: Insufficient documentation

## 2020-11-17 DIAGNOSIS — R059 Cough, unspecified: Secondary | ICD-10-CM | POA: Insufficient documentation

## 2020-11-17 LAB — CBC
HCT: 47 % (ref 39.0–52.0)
Hemoglobin: 15.8 g/dL (ref 13.0–17.0)
MCH: 32.3 pg (ref 26.0–34.0)
MCHC: 33.6 g/dL (ref 30.0–36.0)
MCV: 96.1 fL (ref 80.0–100.0)
Platelets: 361 10*3/uL (ref 150–400)
RBC: 4.89 MIL/uL (ref 4.22–5.81)
RDW: 14.8 % (ref 11.5–15.5)
WBC: 4 10*3/uL (ref 4.0–10.5)
nRBC: 0 % (ref 0.0–0.2)

## 2020-11-17 LAB — BASIC METABOLIC PANEL
Anion gap: 15 (ref 5–15)
BUN: 5 mg/dL — ABNORMAL LOW (ref 6–20)
CO2: 27 mmol/L (ref 22–32)
Calcium: 8.8 mg/dL — ABNORMAL LOW (ref 8.9–10.3)
Chloride: 98 mmol/L (ref 98–111)
Creatinine, Ser: 1.12 mg/dL (ref 0.61–1.24)
GFR, Estimated: >60 mL/min (ref >60)
Glucose, Bld: 111 mg/dL — ABNORMAL HIGH (ref 70–99)
Potassium: 3.6 mmol/L (ref 3.5–5.1)
Sodium: 140 mmol/L (ref 135–145)

## 2020-11-17 LAB — RESP PANEL BY RT-PCR (FLU A&B, COVID) ARPGX2
Influenza A by PCR: NEGATIVE
Influenza B by PCR: NEGATIVE
SARS Coronavirus 2 by RT PCR: NEGATIVE

## 2020-11-17 LAB — TROPONIN I (HIGH SENSITIVITY)
Troponin I (High Sensitivity): 44 ng/L — ABNORMAL HIGH (ref <18)
Troponin I (High Sensitivity): 41 ng/L — ABNORMAL HIGH (ref <18)

## 2020-11-17 IMAGING — CR DG CHEST 2V
1 series · 2 of 2 positions shown · non-contrast
Comparison: [DATE] and prior

CLINICAL DATA: chest pain

EXAM:
CHEST - 2 VIEW

[Series 1: dg chest 2 view · 0.14mm/px · 2 of 2 slices shown]
[im 1/2]
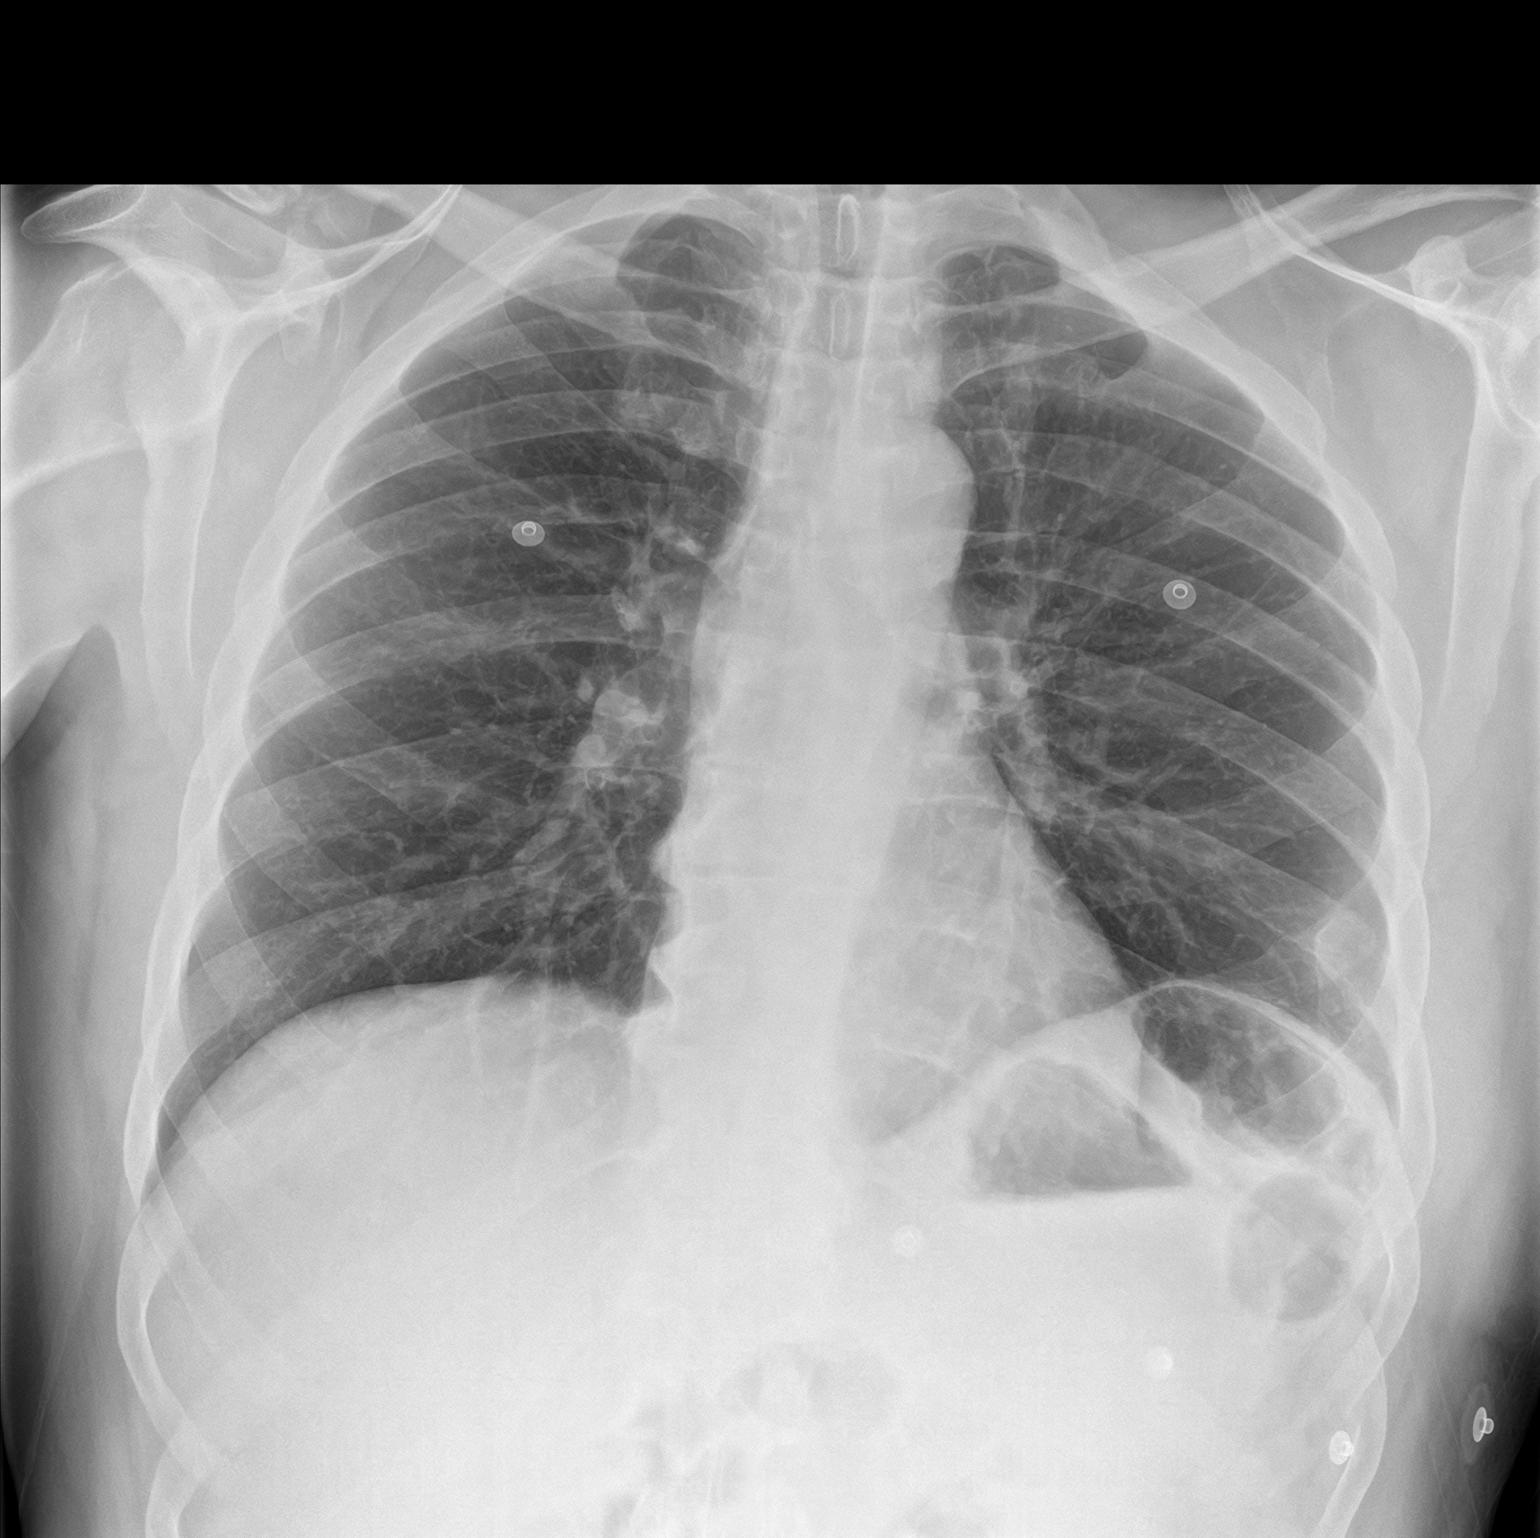
[im 2/2]
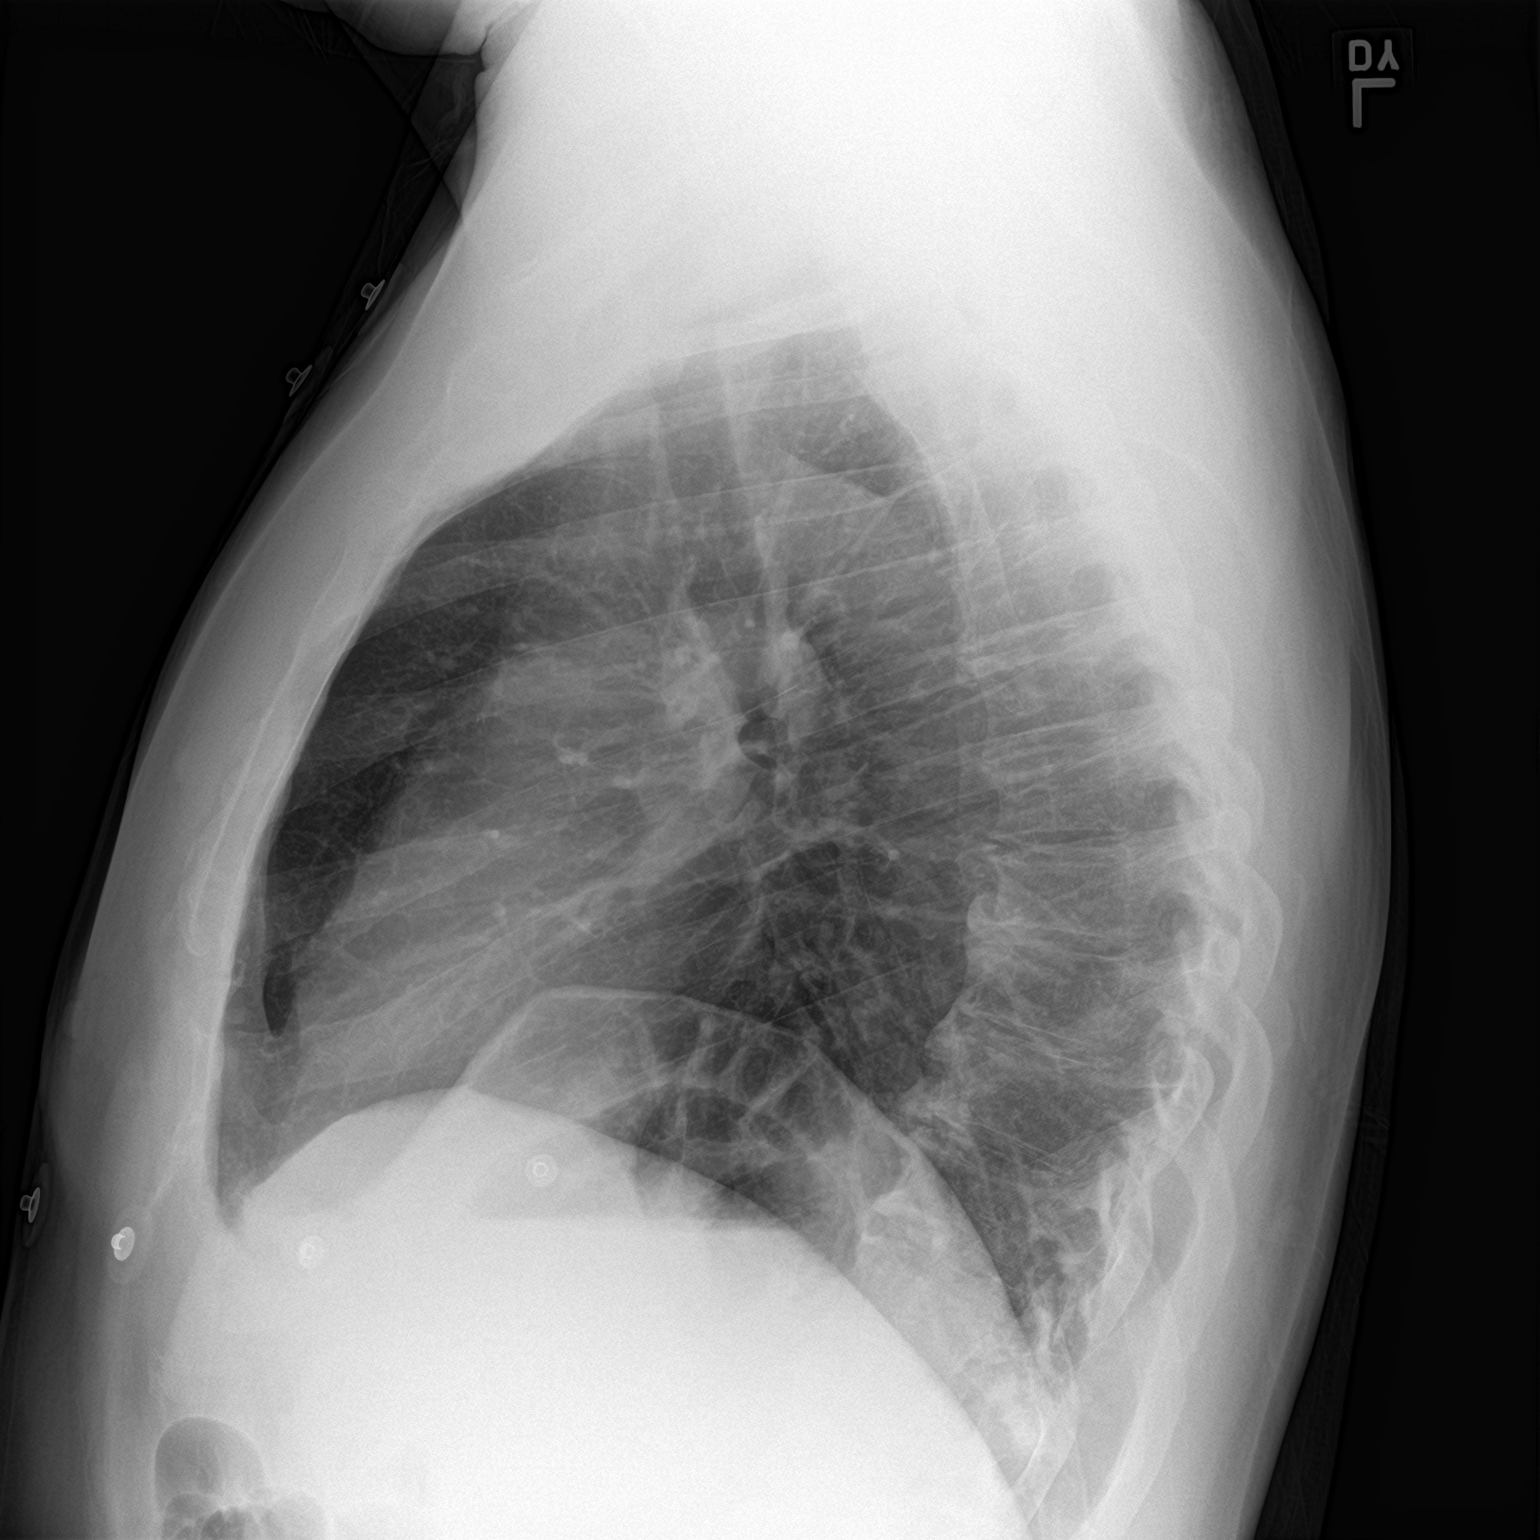

[2 of 2 positions shown; findings below may reference images not displayed]

FINDINGS: No focal consolidation. No pneumothorax or pleural effusion.
Cardiomediastinal silhouette is within normal limits. Chronic right
clavicle deformity. Multilevel spondylosis.
IMPRESSION: No focal airspace disease.

## 2020-11-17 MED ORDER — HYDROCHLOROTHIAZIDE 12.5 MG PO CAPS
12.5000 mg | ORAL_CAPSULE | Freq: Every day | ORAL | Status: DC
  Administered 2020-11-17: 16:00:00 12.5 mg via ORAL
  Filled 2020-11-17: qty 1

## 2020-11-17 MED ORDER — AMLODIPINE BESYLATE 5 MG PO TABS
5.0000 mg | ORAL_TABLET | Freq: Once | ORAL | Status: AC
  Administered 2020-11-17: 16:00:00 5 mg via ORAL
  Filled 2020-11-17: qty 1

## 2020-11-17 MED ORDER — HYDROCHLOROTHIAZIDE 25 MG PO TABS: 25.0000 mg | ORAL_TABLET | Freq: Every day | ORAL | 5 refills | Status: DC

## 2020-11-17 MED ORDER — NICOTINE 7 MG/24HR TD PT24
7.0000 mg | MEDICATED_PATCH | Freq: Once | TRANSDERMAL | Status: DC
  Administered 2020-11-17: 15:00:00 7 mg via TRANSDERMAL
  Filled 2020-11-17: qty 1

## 2020-11-17 NOTE — ED Notes (Signed)
Provider at bedside. Pt denies CP, stating that CP was 7/10 before ambulance came but resolved before arrived to hospital. Pt has hx hypertension and has been off BP meds for 3 years.

## 2020-11-17 NOTE — ED Notes (Signed)
Pt states cough is chronic, the same as today. Pt smokes 1 pack cigarettes/day.

## 2020-11-17 NOTE — ED Triage Notes (Signed)
Pt in via EMS from home with c/o CP. EMS reports started 3 hours ago and has since subsided. Pt has been out of BP meds for 3 years. Pt admits to ETOH use this am. 171/127, RR 16, HR 85, NSR, 95-96% RA

## 2020-11-17 NOTE — ED Notes (Signed)
Will discharge pt once papers printed.

## 2020-11-17 NOTE — ED Provider Notes (Addendum)
[facility] Emergency Department Provider Note  ____________________________________________  Time seen: Approximately 1:25 PM  I have reviewed the triage vital signs and the nursing notes.   HISTORY  Chief Complaint Chest Pain    HPI James Lambert is a 54 y.o. male who presents the emergency department complaining of chest pain and cough.  Patient states that earlier this morning he started having substernal pressure-like chest pain.  Patient had no radiation of this pain and states that the pain is currently gone.  No cardiac history.  Patient had no palpitations.  Patient does have a history of asthma and COPD and has had coughing over the past several days.  Patient states that this is a new cough which she describes as productive.  No reported fevers or chills, nasal congestion or sore throat.  No frank difficulty breathing.  No abdominal pain, nausea vomiting, diarrhea or constipation.  Patient does have a history of hypertension but is not taking any antihypertensive medications over the past 3 years and did arrive with an elevated blood pressure reading.   Patient blood pressure with EMS was 171/127 and our initial blood pressure reading was 133/92.  Currently patient blood pressure is elevated at 173/123.  Again patient reports complete resolution of chest pain at this time.        Past Medical History:  Diagnosis Date  . Asthma   . COPD (chronic obstructive pulmonary disease) (HCC)   . Hypertension     There are no problems to display for this patient.   History reviewed. No pertinent surgical history.  Prior to Admission medications   Medication Sig Start Date End Date Taking? Authorizing Provider  acetaminophen (TYLENOL) 325 MG tablet Take 650 mg by mouth every 4 (four) hours as needed. 07/01/16   [provider]  amLODipine (NORVASC) 5 MG tablet Take 1 tablet (5 mg total) by mouth daily. 01/03/20   Irean Hong, MD  hydrochlorothiazide  (HYDRODIURIL) 25 MG tablet Take 1 tablet (25 mg total) by mouth daily. 11/17/20   Sumiye Hirth, Delorise Royals, PA-C  ibuprofen (ADVIL) 200 MG tablet Take 200 mg by mouth every 6 (six) hours as needed.    [provider]    Allergies Patient has no known allergies.  No family history on file.  Social History Social History   Tobacco Use  . Smoking status: Current Every Day Smoker    Packs/day: 0.50    Types: Cigarettes  . Smokeless tobacco: Never Used  Substance Use Topics  . Alcohol use: Yes    Comment: daily  . Drug use: Yes    Types: Marijuana     Review of Systems  Constitutional: No fever/chills Eyes: No visual changes. No discharge ENT: No upper respiratory complaints. Cardiovascular: Substernal chest pain that has resolved Respiratory: Positive cough. No SOB. Gastrointestinal: No abdominal pain.  No nausea, no vomiting.  No diarrhea.  No constipation. Musculoskeletal: Negative for musculoskeletal pain. Skin: Negative for rash, abrasions, lacerations, ecchymosis. Neurological: Negative for headaches, focal weakness or numbness.  10 System ROS otherwise negative.  ____________________________________________   PHYSICAL EXAM:  VITAL SIGNS: ED Triage Vitals  Enc Vitals Group     BP 11/17/20 0907 (!) 133/92     Pulse Rate 11/17/20 0907 92     Resp 11/17/20 0907 16     Temp 11/17/20 0907 98.9 F (37.2 C)     Temp Source 11/17/20 0907 Oral     SpO2 11/17/20 0907 98 %  Weight 11/17/20 0856 255 lb 1.2 oz (115.7 kg)     Height 11/17/20 0856 6\' 2"  (1.88 m)     Head Circumference --      Peak Flow --      Pain Score 11/17/20 0856 0     Pain Loc --      Pain Edu? --      Excl. in GC? --      Constitutional: Alert and oriented. Well appearing and in no acute distress. Eyes: Conjunctivae are normal. PERRL. EOMI. Head: Atraumatic. ENT:      Ears:       Nose: No congestion/rhinnorhea.      Mouth/Throat: Mucous membranes are moist.  Neck: No stridor.   Neck is supple full range of motion Hematological/Lymphatic/Immunilogical: No cervical lymphadenopathy. Cardiovascular: Normal rate, regular rhythm. Normal S1 and S2.  No appreciable murmurs, rubs, gallops.  Good peripheral circulation. Respiratory: Normal respiratory effort without tachypnea or retractions. Lungs with faint crackles in the lower lung fields no frank wheezing.  No rales or rhonchi.14/11/21 air entry to the bases with no decreased or absent breath sounds. Gastrointestinal: Bowel sounds 4 quadrants. Soft and nontender to palpation. No guarding or rigidity. No palpable masses. No distention.  Musculoskeletal: Full range of motion to all extremities. No gross deformities appreciated. Neurologic:  Normal speech and language. No gross focal neurologic deficits are appreciated.  Skin:  Skin is warm, dry and intact. No rash noted. Psychiatric: Mood and affect are normal. Speech and behavior are normal. Patient exhibits appropriate insight and judgement.   ____________________________________________   LABS (all labs ordered are listed, but only abnormal results are displayed)  Labs Reviewed  BASIC METABOLIC PANEL - Abnormal; Notable for the following components:      Result Value   Glucose, Bld 111 (*)    BUN 5 (*)    Calcium 8.8 (*)    All other components within normal limits  TROPONIN I (HIGH SENSITIVITY) - Abnormal; Notable for the following components:   Troponin I (High Sensitivity) 44 (*)    All other components within normal limits  TROPONIN I (HIGH SENSITIVITY) - Abnormal; Notable for the following components:   Troponin I (High Sensitivity) 41 (*)    All other components within normal limits  RESP PANEL BY RT-PCR (FLU A&B, COVID) ARPGX2  CBC   ____________________________________________  EKG  ED ECG REPORT I, Peri Jefferson Charlette Hennings,  personally viewed and interpreted this ECG.   Date: 11/17/2020  EKG Time: 0850 hrs.  Rate: 89 bpm  Rhythm: unchanged from  previous tracings, normal sinus rhythm  Axis: Normal axis  Intervals:none  ST&T Change: No ST elevation or depression noted  No STEMI.  Normal sinus rhythm.  Unchanged from previous EKG from 06/04/2020  ____________________________________________  RADIOLOGY I personally viewed and evaluated these images as part of my medical decision making, as well as reviewing the written report by the radiologist.  ED Provider Interpretation: No acute findings on chest x-ray.  DG Chest 2 View  Result Date: 11/17/2020 CLINICAL DATA:  chest pain EXAM: CHEST - 2 VIEW COMPARISON:  06/04/2020 and prior FINDINGS: No focal consolidation. No pneumothorax or pleural effusion. Cardiomediastinal silhouette is within normal limits. Chronic right clavicle deformity. Multilevel spondylosis. IMPRESSION: No focal airspace disease. Electronically Signed   By: 06/06/2020 M.D.   On: 11/17/2020 10:05    ____________________________________________    PROCEDURES  Procedure(s) performed:    Procedures    Medications  nicotine (NICODERM CQ - dosed  in mg/24 hr) patch 7 mg (7 mg Transdermal Patch Applied 11/17/20 1459)  amLODipine (NORVASC) tablet 5 mg (has no administration in time range)  hydrochlorothiazide (MICROZIDE) capsule 12.5 mg (has no administration in time range)     ____________________________________________   INITIAL IMPRESSION / ASSESSMENT AND PLAN / ED COURSE  Pertinent labs & imaging results that were available during my care of the patient were reviewed by me and considered in my medical decision making (see chart for details).  Review of the  [place]RS was performed in accordance of the [facility] prior to dispensing any controlled drugs.  Clinical Course as of 11/17/20 1555  Sat Nov 17, 2020  1338 Patient presented to emergency department for evaluation of cough, chest pain.  Patient states that the chest pain started this morning, he called EMS for same but was asymptomatic for  his chest pain prior to arrival in the emergency department.  No cardiac history.  Patient states that he has had no return of his chest pain.  He does have a new onset cough.  He has a history of COPD and asthma but states that this cough is productive and different than any chronic cough.  No fevers or chills.  No nasal congestion, sore throat.  Patient denied any recent sick contacts.  Initial work-up was reassuring with the exception of an elevated troponin.  Remainder of labs, chest x-ray, EKG was reassuring.  Exam is reassuring at this time.  Will trial second troponin for trending purposes.  Covid swab at this time. [JC]    Clinical Course User Index [JC] Briselda Naval, Delorise Royals, PA-C          Patient's diagnosis is consistent with nonspecific chest pain, hypertension.  Patient presented to the emergency department complaining of chest pain that had resolved prior to arriving in the emergency department.  No cardiac history.  Patient was asymptomatic other than a a cough care.  Overall exam was reassuring.  Patient had an elevated troponin at 44.  Repeat was trending down to 41.  No EKG changes concerning for STEMI.  Normal sinus rhythm on the monitor while patient was here.  No indication for admission at this time.  Patient will be started on antihypertensives as an outpatient.  Follow-up with primary care regarding his hypertension.  Patient will be referred to cardiology for follow-up.Marland Kitchen  Return precautions discussed at length with the patient.  Patient is given ED precautions to return to the ED for any worsening or new symptoms.     ____________________________________________  FINAL CLINICAL IMPRESSION(S) / ED DIAGNOSES  Final diagnoses:  Nonspecific chest pain      NEW MEDICATIONS STARTED DURING THIS VISIT:  ED Discharge Orders         Ordered    hydrochlorothiazide (HYDRODIURIL) 25 MG tablet  Daily        11/17/20 1555              This chart was dictated using  voice recognition software/Dragon. Despite best efforts to proofread, errors can occur which can change the meaning. Any change was purely unintentional.    Racheal Patches, PA-C 11/17/20 1556    Katlynne Mckercher, Delorise Royals, PA-C 11/17/20 1814    Delton Prairie, MD 11/18/20 1026

## 2020-11-17 NOTE — ED Notes (Signed)
Warm blanket provided. Ice water provided, ok'd by provider.

## 2020-11-17 NOTE — ED Notes (Signed)
Provided urinal. °

## 2020-12-21 ENCOUNTER — Emergency Department
Admission: EM | Admit: 2020-12-21 | Discharge: 2020-12-21 | Disposition: A | Discharge status: Home / Self Care | Payer: Self-pay | Attending: Emergency Medicine | Admitting: Emergency Medicine

## 2020-12-21 ENCOUNTER — Encounter: Payer: Self-pay | Admitting: Emergency Medicine

## 2020-12-21 ENCOUNTER — Other Ambulatory Visit: Payer: Self-pay

## 2020-12-21 ENCOUNTER — Emergency Department: Payer: Self-pay

## 2020-12-21 DIAGNOSIS — F17210 Nicotine dependence, cigarettes, uncomplicated: Secondary | ICD-10-CM | POA: Insufficient documentation

## 2020-12-21 DIAGNOSIS — M546 Pain in thoracic spine: Secondary | ICD-10-CM | POA: Insufficient documentation

## 2020-12-21 DIAGNOSIS — R0789 Other chest pain: Secondary | ICD-10-CM | POA: Insufficient documentation

## 2020-12-21 DIAGNOSIS — S8001XA Contusion of right knee, initial encounter: Secondary | ICD-10-CM | POA: Insufficient documentation

## 2020-12-21 DIAGNOSIS — S40012A Contusion of left shoulder, initial encounter: Secondary | ICD-10-CM | POA: Insufficient documentation

## 2020-12-21 DIAGNOSIS — J449 Chronic obstructive pulmonary disease, unspecified: Secondary | ICD-10-CM | POA: Insufficient documentation

## 2020-12-21 DIAGNOSIS — R2242 Localized swelling, mass and lump, left lower limb: Secondary | ICD-10-CM | POA: Insufficient documentation

## 2020-12-21 DIAGNOSIS — Z79899 Other long term (current) drug therapy: Secondary | ICD-10-CM | POA: Insufficient documentation

## 2020-12-21 DIAGNOSIS — M545 Low back pain, unspecified: Secondary | ICD-10-CM | POA: Insufficient documentation

## 2020-12-21 DIAGNOSIS — J45909 Unspecified asthma, uncomplicated: Secondary | ICD-10-CM | POA: Insufficient documentation

## 2020-12-21 DIAGNOSIS — T07XXXA Unspecified multiple injuries, initial encounter: Secondary | ICD-10-CM

## 2020-12-21 DIAGNOSIS — Y92410 Unspecified street and highway as the place of occurrence of the external cause: Secondary | ICD-10-CM | POA: Insufficient documentation

## 2020-12-21 DIAGNOSIS — I10 Essential (primary) hypertension: Secondary | ICD-10-CM | POA: Insufficient documentation

## 2020-12-21 LAB — URINALYSIS, COMPLETE (UACMP) WITH MICROSCOPIC
Bacteria, UA: NONE SEEN
Bilirubin Urine: NEGATIVE
Glucose, UA: NEGATIVE mg/dL
Hgb urine dipstick: NEGATIVE
Ketones, ur: NEGATIVE mg/dL
Leukocytes,Ua: NEGATIVE
Nitrite: NEGATIVE
Protein, ur: NEGATIVE mg/dL
Specific Gravity, Urine: 1.009 (ref 1.005–1.030)
pH: 5 (ref 5.0–8.0)
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
## 2020-12-21 MED ORDER — HYDROCODONE-ACETAMINOPHEN 5-325 MG PO TABS
1.0000 | ORAL_TABLET | Freq: Once | ORAL | Status: AC
  Administered 2020-12-21: 1 via ORAL
  Filled 2020-12-21: qty 1
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
## 2020-12-21 NOTE — Discharge Instructions (Addendum)
Follow-up with your primary care provider or make an appointment Dr. Martha Clan who is the orthopedist on-call if any continued problems with your knee. Wear the knee immobilizer for added support and protection. Ice and elevation as needed for your knee. You will most likely be sore and stiff for the next 4 to 5 days even with medication. Take the pain medication that you mentioned that you have at home. If any severe worsening of your symptoms return to the emergency department.

## 2021-08-07 ENCOUNTER — Emergency Department: Payer: Self-pay

## 2021-08-07 ENCOUNTER — Other Ambulatory Visit: Payer: Self-pay

## 2021-08-07 ENCOUNTER — Emergency Department
Admission: EM | Admit: 2021-08-07 | Discharge: 2021-08-07 | Disposition: A | Discharge status: Home / Self Care | Payer: Self-pay | Attending: Emergency Medicine | Admitting: Emergency Medicine

## 2021-08-07 DIAGNOSIS — J45909 Unspecified asthma, uncomplicated: Secondary | ICD-10-CM | POA: Insufficient documentation

## 2021-08-07 DIAGNOSIS — S20212A Contusion of left front wall of thorax, initial encounter: Secondary | ICD-10-CM | POA: Insufficient documentation

## 2021-08-07 DIAGNOSIS — W19XXXA Unspecified fall, initial encounter: Secondary | ICD-10-CM | POA: Insufficient documentation

## 2021-08-07 DIAGNOSIS — I10 Essential (primary) hypertension: Secondary | ICD-10-CM | POA: Insufficient documentation

## 2021-08-07 DIAGNOSIS — F17210 Nicotine dependence, cigarettes, uncomplicated: Secondary | ICD-10-CM | POA: Insufficient documentation

## 2021-08-07 DIAGNOSIS — Z79899 Other long term (current) drug therapy: Secondary | ICD-10-CM | POA: Insufficient documentation

## 2021-08-07 DIAGNOSIS — J449 Chronic obstructive pulmonary disease, unspecified: Secondary | ICD-10-CM | POA: Insufficient documentation

## 2021-08-07 DIAGNOSIS — R0789 Other chest pain: Secondary | ICD-10-CM

## 2021-08-07 DIAGNOSIS — L732 Hidradenitis suppurativa: Secondary | ICD-10-CM | POA: Insufficient documentation

## 2021-08-07 HISTORY — DX: Hidradenitis suppurativa: L73.2

## 2021-08-07 LAB — TROPONIN I (HIGH SENSITIVITY)
Troponin I (High Sensitivity): 4 ng/L (ref <18)
Troponin I (High Sensitivity): 4 ng/L (ref <18)

## 2021-08-07 LAB — BASIC METABOLIC PANEL
Anion gap: 12 (ref 5–15)
BUN: 7 mg/dL (ref 6–20)
CO2: 21 mmol/L — ABNORMAL LOW (ref 22–32)
Calcium: 8.5 mg/dL — ABNORMAL LOW (ref 8.9–10.3)
Chloride: 97 mmol/L — ABNORMAL LOW (ref 98–111)
Creatinine, Ser: 1.23 mg/dL (ref 0.61–1.24)
GFR, Estimated: >60 mL/min (ref >60)
Glucose, Bld: 108 mg/dL — ABNORMAL HIGH (ref 70–99)
Potassium: 3.5 mmol/L (ref 3.5–5.1)
Sodium: 130 mmol/L — ABNORMAL LOW (ref 135–145)

## 2021-08-07 LAB — CBC
HCT: 29.9 % — ABNORMAL LOW (ref 39.0–52.0)
Hemoglobin: 10.4 g/dL — ABNORMAL LOW (ref 13.0–17.0)
MCH: 34.2 pg — ABNORMAL HIGH (ref 26.0–34.0)
MCHC: 34.8 g/dL (ref 30.0–36.0)
MCV: 98.4 fL (ref 80.0–100.0)
Platelets: 216 10*3/uL (ref 150–400)
RBC: 3.04 MIL/uL — ABNORMAL LOW (ref 4.22–5.81)
RDW: 13.4 % (ref 11.5–15.5)
WBC: 6.9 10*3/uL (ref 4.0–10.5)
nRBC: 0 % (ref 0.0–0.2)
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
## 2021-08-07 MED ORDER — AMLODIPINE BESYLATE 5 MG PO TABS
5.0000 mg | ORAL_TABLET | Freq: Once | ORAL | Status: AC
  Administered 2021-08-07: 5 mg via ORAL
  Filled 2021-08-07: qty 1

## 2021-08-07 MED ORDER — CLINDAMYCIN PHOSPHATE 1 % EX SOLN: CUTANEOUS | 2 refills | Status: DC

## 2021-08-07 MED ORDER — ACETAMINOPHEN 500 MG PO TABS
1000.0000 mg | ORAL_TABLET | Freq: Once | ORAL | Status: AC
  Administered 2021-08-07: 1000 mg via ORAL
  Filled 2021-08-07: qty 2

## 2021-08-07 MED ORDER — AMLODIPINE BESYLATE 5 MG PO TABS: 5.0000 mg | ORAL_TABLET | Freq: Every day | ORAL | 11 refills | Status: DC

## 2021-08-07 MED ORDER — DOXYCYCLINE HYCLATE 100 MG PO CAPS: 100.0000 mg | ORAL_CAPSULE | Freq: Two times a day (BID) | ORAL | 0 refills | Status: DC

## 2021-08-07 MED ORDER — DOXYCYCLINE HYCLATE 100 MG PO TABS
100.0000 mg | ORAL_TABLET | Freq: Once | ORAL | Status: AC
  Administered 2021-08-07: 100 mg via ORAL
  Filled 2021-08-07: qty 1

## 2021-08-07 NOTE — ED Provider Notes (Signed)
[facility] Emergency Department Provider Note  ____________________________________________   Event Date/Time   First MD Initiated Contact with Patient 08/07/21 2258     (approximate)  I have reviewed the triage vital signs and the nursing notes.   HISTORY  Chief Complaint Chest Pain    HPI James Lambert is a 55 y.o. male   with medical history as listed below (although he was not previously given a diagnosis of hidradenitis suppurativa) who presents for evaluation of a variety of complaints.  He states that he has been having chest pain in the left side of his chest for about a month and is tired of feeling it.  He had a fall several days ago and has some bruising on the left lateral part of his ribs or his back is worried that he might of broken a rib.  He primarily wants to discuss some "boils" that are around his genitals and one on his buttocks.  He also states that he has run out of his blood pressure medicine, does not have enough money to see a primary care doctor, and usually comes to the emergency department for blood pressure refills.  1) chest pain: Dull, aching, worse when he moves around.  No shortness of breath.  Nothing in particular makes it better.  Has been going on for about a month.  2) "boils": States that he has had these types of lesions since he was a kid.  Never given a diagnosis.  He had a lesion drained once and said that it was very painful and that it came right back.  He said that usually they go away on their own or just scar down, but the lesion or lesions in his groin have been bothering him more and is starting to drain pus.  No more pain than usual.  Same with the lesion on the top of his buttocks but he cannot see it.        Past Medical History:  Diagnosis Date   Asthma    COPD (chronic obstructive pulmonary disease) (HCC)    Hidradenitis suppurativa    diagnosed in [facility] ED based on history and physical exam    Hypertension     There are no problems to display for this patient.   History reviewed. No pertinent surgical history.  Prior to Admission medications   Medication Sig Start Date End Date Taking? Authorizing Provider  amLODipine (NORVASC) 5 MG tablet Take 1 tablet (5 mg total) by mouth daily. 08/07/21 08/07/22 Yes Loleta Rose, MD  clindamycin (CLEOCIN-T) 1 % external solution Apply to affected area 2 times daily 08/07/21 08/07/22 Yes Loleta Rose, MD  doxycycline (VIBRAMYCIN) 100 MG capsule Take 1 capsule (100 mg total) by mouth 2 (two) times daily for 14 days. 08/07/21 08/21/21 Yes Loleta Rose, MD  acetaminophen (TYLENOL) 325 MG tablet Take 650 mg by mouth every 4 (four) hours as needed. 07/01/16   [provider]  hydrochlorothiazide (HYDRODIURIL) 25 MG tablet Take 1 tablet (25 mg total) by mouth daily. 11/17/20   Cuthriell, Delorise Royals, PA-C  ibuprofen (ADVIL) 200 MG tablet Take 200 mg by mouth every 6 (six) hours as needed.    [provider]    Allergies Patient has no known allergies.  History reviewed. No pertinent family history.  Social History Social History   Tobacco Use   Smoking status: Every Day    Packs/day: 0.50    Types: Cigarettes   Smokeless tobacco: Never  Substance Use Topics   Alcohol use: Yes    Comment: daily   Drug use: Yes    Types: Marijuana    Review of Systems Constitutional: No fever/chills Eyes: No visual changes. ENT: No sore throat. Cardiovascular: Left-sided nonradiating chest pain for about a month as well as left-sided lateral back pain after a fall. Respiratory: Denies shortness of breath. Gastrointestinal: No abdominal pain.  No nausea, no vomiting.  No diarrhea.  No constipation. Genitourinary: Abscesses or lesions that have started to leak material.  No dysuria. Musculoskeletal: Negative for neck pain.  Negative for back pain. Integumentary: Abscesses or lesions in his groin at the top of his  buttocks. Neurological: Negative for headaches, focal weakness or numbness.   ____________________________________________   PHYSICAL EXAM:  VITAL SIGNS: ED Triage Vitals  Enc Vitals Group     BP 08/07/21 1847 (!) 141/101     Pulse Rate 08/07/21 1847 91     Resp 08/07/21 1847 20     Temp 08/07/21 1847 98 F (36.7 C)     Temp Source 08/07/21 1847 Oral     SpO2 08/07/21 1847 100 %     Weight 08/07/21 1848 (!) 147.4 kg (325 lb)     Height 08/07/21 1848 1.88 m (6\' 2" )     Head Circumference --      Peak Flow --      Pain Score 08/07/21 1848 10     Pain Loc --      Pain Edu? --      Excl. in GC? --     Constitutional: Alert and oriented.  Eyes: Conjunctivae are normal.  Head: Atraumatic. Nose: No congestion/rhinnorhea. Mouth/Throat: Patient is wearing a mask. Neck: No stridor.  No meningeal signs.   Cardiovascular: Normal rate, regular rhythm. Good peripheral circulation. Respiratory: Normal respiratory effort.  No retractions. Gastrointestinal: Soft and nontender. No distention.  Musculoskeletal: Reproducible left-sided chest wall tenderness.  He also has some subacute bruising to the left lateral part of his upper back consistent with his history of fall that is also tender to palpation but without any point tenderness.  No lower extremity tenderness nor edema. No gross deformities of extremities. Neurologic:  Normal speech and language. No gross focal neurologic deficits are appreciated.  Skin: Skin is warm and dry.  Patient has extensive scarring in his groin and perineum.  He has a couple of lesions that are draining some fluid that appears mostly serous but there is a degree of purulence.  The lesions and scarring are indurated but not fluctuant, there is no crepitus, there is no significant tenderness to palpation (in contrast to its appearance), and the lesions appear to be somewhat linear as if there are sinus tracts present.  The presentation is very consistent with high  denies suppurativa that is been chronic for years.  He has a scarred down and generally well-healed lesion at the top of his buttocks with no surrounding induration nor fluctuance that appears to have drained and healed. Psychiatric: Mood and affect are normal. Speech and behavior are normal.  ____________________________________________   LABS (all labs ordered are listed, but only abnormal results are displayed)  Labs Reviewed  BASIC METABOLIC PANEL - Abnormal; Notable for the following components:      Result Value   Sodium 130 (*)    Chloride 97 (*)    CO2 21 (*)    Glucose, Bld 108 (*)    Calcium 8.5 (*)    All other components within  normal limits  CBC - Abnormal; Notable for the following components:   RBC 3.04 (*)    Hemoglobin 10.4 (*)    HCT 29.9 (*)    MCH 34.2 (*)    All other components within normal limits  TROPONIN I (HIGH SENSITIVITY)  TROPONIN I (HIGH SENSITIVITY)   ____________________________________________  EKG  ED ECG REPORT I, Loleta Roseory Tyriana Helmkamp, the attending physician, personally viewed and interpreted this ECG.  Date: 08/07/2021 EKG Time: 18: 44 Rate: 95 Rhythm: normal sinus rhythm QRS Axis: normal Intervals: normal ST/T Wave abnormalities: normal Narrative Interpretation: no evidence of acute ischemia  ____________________________________________  RADIOLOGY I, Loleta Roseory Rj Pedrosa, personally viewed and evaluated these images (plain radiographs) as part of my medical decision making, as well as reviewing the written report by the radiologist.  ED MD interpretation: No acute abnormality on chest x-ray  Official radiology report(s): DG Chest 2 View  Result Date: 08/07/2021 CLINICAL DATA:  Left-sided nonradiating chest pain. EXAM: CHEST - 2 VIEW COMPARISON:  December 21, 2020 FINDINGS: The heart size and mediastinal contours are within normal limits. Both lungs are clear. Degenerative changes are seen within the mid and lower thoracic spine. IMPRESSION: No  active cardiopulmonary disease. Electronically Signed   By: Aram Candelahaddeus  Houston M.D.   On: 08/07/2021 19:24    ____________________________________________   PROCEDURES   Procedure(s) performed (including Critical Care):  Procedures   ____________________________________________   INITIAL IMPRESSION / MDM / ASSESSMENT AND PLAN / ED COURSE  As part of my medical decision making, I reviewed the following data within the electronic MEDICAL RECORD NUMBER Nursing notes reviewed and incorporated, Labs reviewed , EKG interpreted , Old chart reviewed, Radiograph reviewed , and Notes from prior ED visits   Differential diagnosis includes, but is not limited to, chronic chest pain, hypertension, musculoskeletal pain, contusion, rib fracture, hidradenitis suppurativa, abscess, Fournier's gangrene, necrotizing fasciitis.  The patient's chest pain is chronic.  He has had multiple visits in the past for hypertension.  No sign of ischemia on EKG.  High-sensitivity troponin x2 are negative.  I personally reviewed the patient's imaging and agree with the radiologist's interpretation that there are no acute findings on his chest x-ray.  Basic metabolic panel and CBC are generally reassuring; he has some mild hyponatremia but this has been present in the past, and he has no leukocytosis.  Vital signs are reassuring other than hypertension which is again chronic and similar to his blood pressures in the past.  His skin exam is notable for extensive scarring, sinus tract development, and some active drainage.  I strongly believe that he suffers from hidradenitis suppurativa, particularly because he said that he has been dealing with this issue for decades.  Because this is not representative of an acute abscess but rather a chronic hidradenitis issue, I do not think that incision and drainage would be appropriate for him and he has bad memories of the last time an abscess was drained and does not want to be incised.   I prescribed topical clindamycin as well as a 2-week course of doxycycline which I think will help, but I encouraged him to follow-up with surgery to discuss if there are any other options for him.  Based on the physical exam and his overall appearance, reassuring vital signs, no leukocytosis, etc. I strongly doubt that he has a more systemic infection and there is no evidence of Fournier's gangrene.  No indication for imaging at this time.  I have refilled his antihypertensives and reassured him that  there is no evidence of rib fracture on his chest x-ray.  I provided [facility] as well as with cardiology follow-up.  I provided him with a low cost discount pharmacy card so he could fill his prescriptions.  He understands and agrees with this plan and seems pleased with the outcome of the visit.  I gave my usual and customary return precautions.           ____________________________________________  FINAL CLINICAL IMPRESSION(S) / ED DIAGNOSES  Final diagnoses:  Atypical chest pain  Uncontrolled hypertension  Chest wall pain  Hidradenitis suppurativa     MEDICATIONS GIVEN DURING THIS VISIT:  Medications  amLODipine (NORVASC) tablet 5 mg (5 mg Oral Given 08/07/21 2330)  doxycycline (VIBRA-TABS) tablet 100 mg (100 mg Oral Given 08/07/21 2330)  acetaminophen (TYLENOL) tablet 1,000 mg (1,000 mg Oral Given 08/07/21 2330)     ED Discharge Orders          Ordered    doxycycline (VIBRAMYCIN) 100 MG capsule  2 times daily        08/07/21 2329    clindamycin (CLEOCIN-T) 1 % external solution        08/07/21 2329    amLODipine (NORVASC) 5 MG tablet  Daily        08/07/21 2329             Note:  This document was prepared using Dragon voice recognition software and may include unintentional dictation errors.   Loleta Rose, MD 08/08/21 [phone]

## 2021-08-07 NOTE — Discharge Instructions (Addendum)
As we discussed, the most important thing is for you to schedule follow-up appointments.  You need to establish primary care at the [facility] [place].  You can call them in the morning and ask for the next available appointment.  We also provided follow-up information for a cardiologist with whom you can follow-up for your blood pressure and chest pain, and the name and number for a surgeon with whom you can follow-up regarding the recurrent boils (Hidradenitis suppurativa) with which you have been dealing for a long time.  You also need to get your prescriptions filled.  We provided a discount pharmacy card that should get you the best possible price on your medications.  We suggest you try using it at [facility].  Please take the full course of oral antibiotics (doxycycline) as prescribed.  Use the prescribed ointment (clindamycin, or Cleocin) on the lesions in your groin twice daily until the area clears up.  Please continue taking the blood pressure medicine prescribed, at least until you can follow-up with the primary care doctor or cardiologist to discuss additional blood pressure management.  You can use over-the-counter ibuprofen and/or Tylenol as needed for the chest wall contusion pain.    Return to the emergency department if you develop new or worsening symptoms that concern you.

## 2021-08-07 NOTE — ED Triage Notes (Addendum)
Pt via POV from home. Pt c/o L sided non radiating CP for weeks, pt states he is just tired of it now. Denies SOB. Pt also states he has been off his HTN medication for 2 days. States he fell a couple days ago and landed on his L side now he is hurting when he cough. Pt also states that he wish to get some boils checked. One is between his legs and another on his bottom. Pt is A&OX4 and NAD.

## 2021-08-08 ENCOUNTER — Encounter: Payer: Self-pay | Admitting: Emergency Medicine

## 2021-08-14 ENCOUNTER — Emergency Department
Admission: EM | Admit: 2021-08-14 | Discharge: 2021-08-14 | Disposition: A | Discharge status: Home / Self Care | Payer: Self-pay | Attending: Emergency Medicine | Admitting: Emergency Medicine

## 2021-08-14 ENCOUNTER — Emergency Department: Payer: Self-pay

## 2021-08-14 ENCOUNTER — Other Ambulatory Visit: Payer: Self-pay

## 2021-08-14 ENCOUNTER — Encounter: Payer: Self-pay | Admitting: Emergency Medicine

## 2021-08-14 DIAGNOSIS — I10 Essential (primary) hypertension: Secondary | ICD-10-CM | POA: Insufficient documentation

## 2021-08-14 DIAGNOSIS — M25462 Effusion, left knee: Secondary | ICD-10-CM | POA: Insufficient documentation

## 2021-08-14 DIAGNOSIS — Z79899 Other long term (current) drug therapy: Secondary | ICD-10-CM | POA: Insufficient documentation

## 2021-08-14 DIAGNOSIS — F17210 Nicotine dependence, cigarettes, uncomplicated: Secondary | ICD-10-CM | POA: Insufficient documentation

## 2021-08-14 DIAGNOSIS — J449 Chronic obstructive pulmonary disease, unspecified: Secondary | ICD-10-CM | POA: Insufficient documentation

## 2021-08-14 DIAGNOSIS — J45909 Unspecified asthma, uncomplicated: Secondary | ICD-10-CM | POA: Insufficient documentation

## 2021-08-14 IMAGING — CR DG KNEE COMPLETE 4+V*L*
1 series · 4 of 4 positions shown · non-contrast
Comparison: Left knee series [DATE].

CLINICAL DATA: 55-year-old male with chronic left knee pain and
swelling.

EXAM:
LEFT KNEE - COMPLETE 4+ VIEW

[Series 1: dg knee complete 4 views left · 0.14mm/px · 4 of 4 slices shown]
[im 1/4]
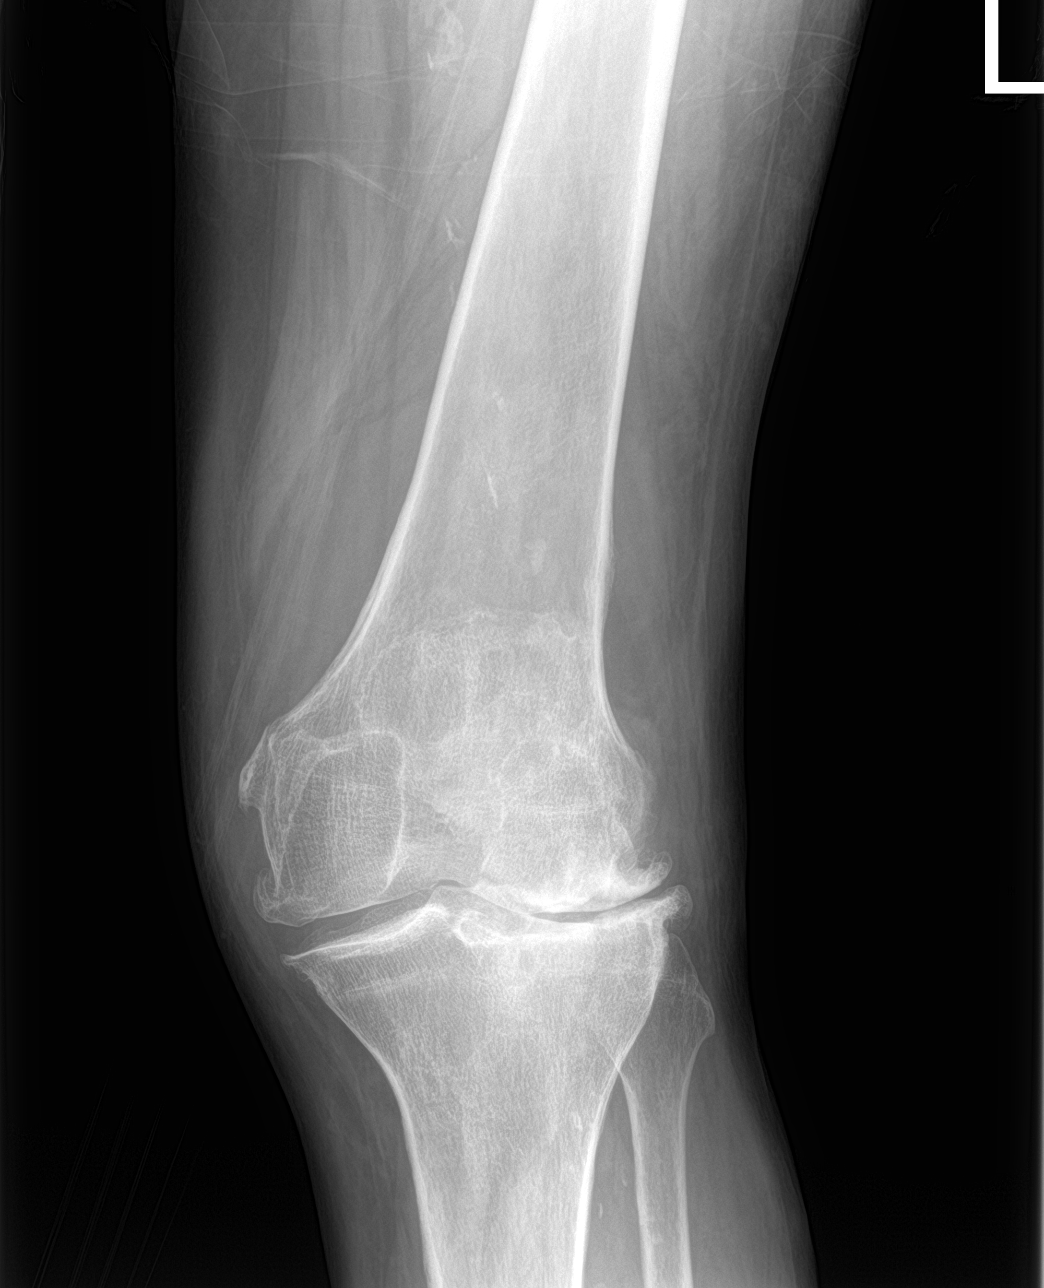
[im 2/4]
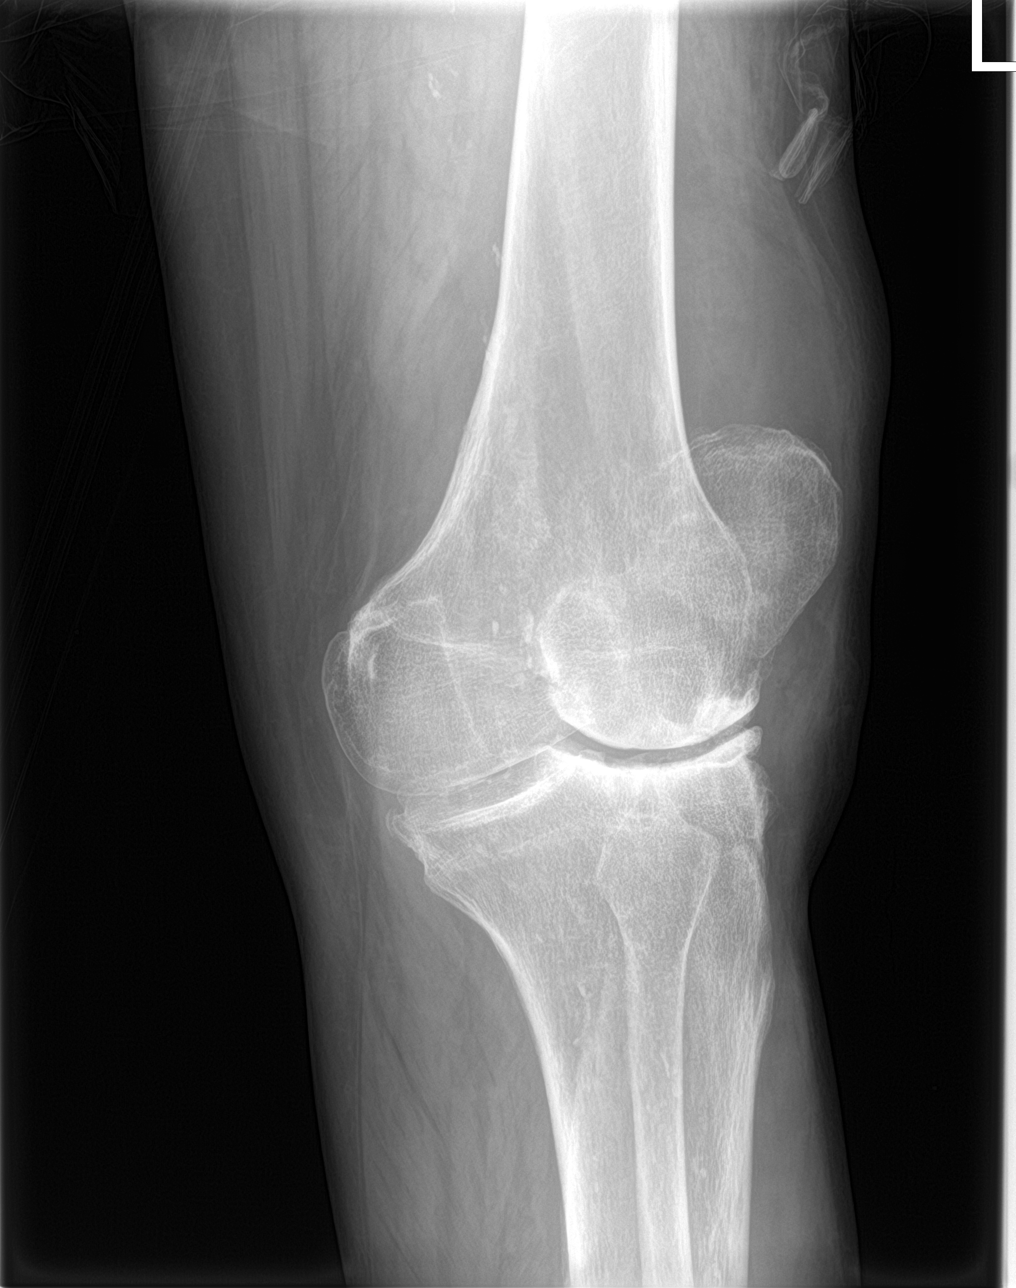
[im 3/4]
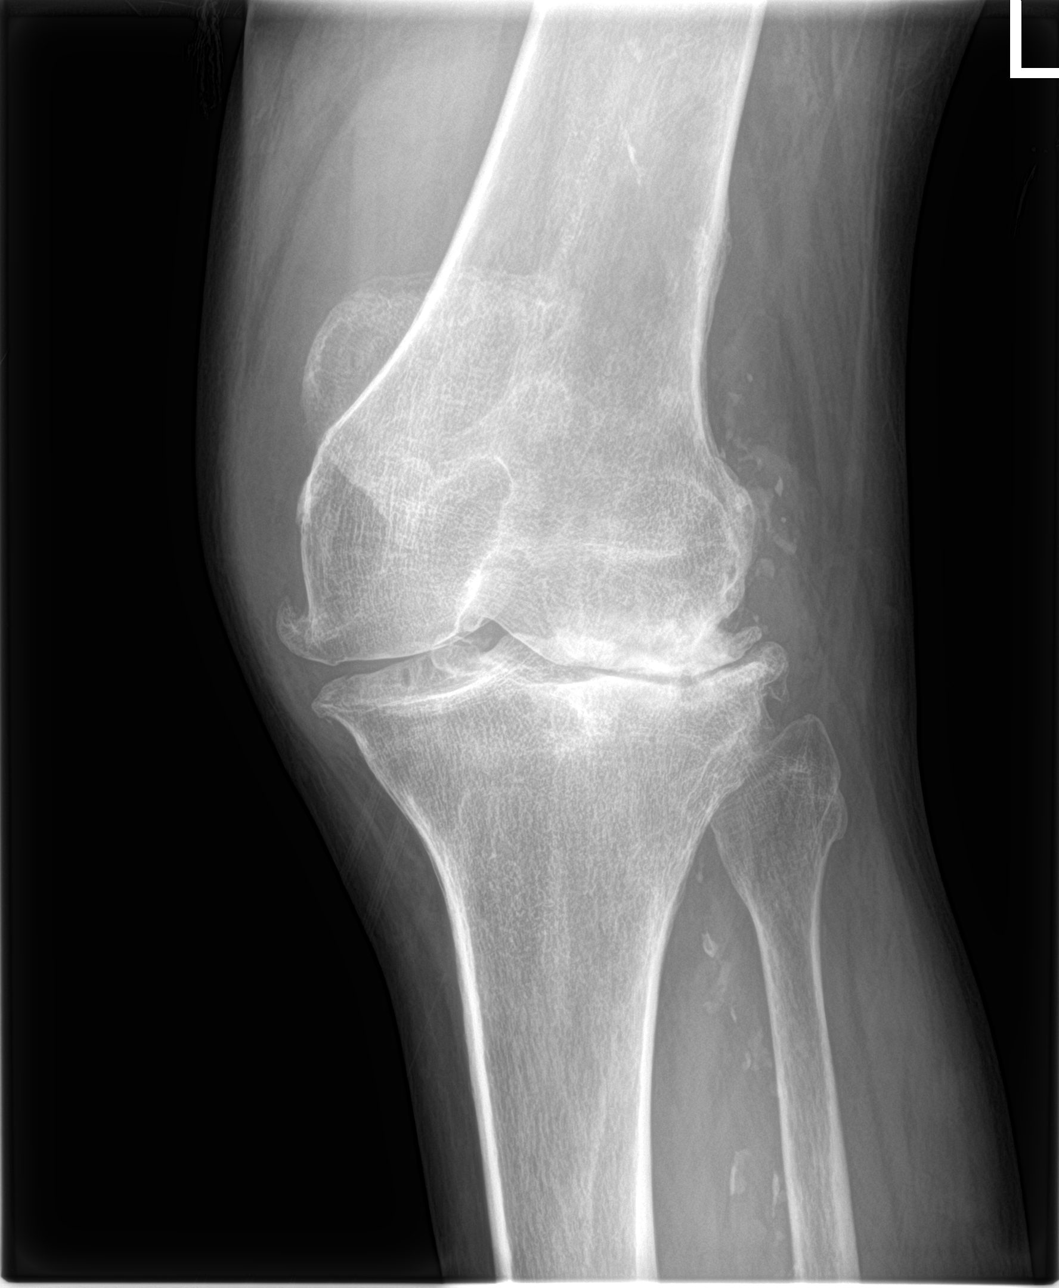
[im 4/4]
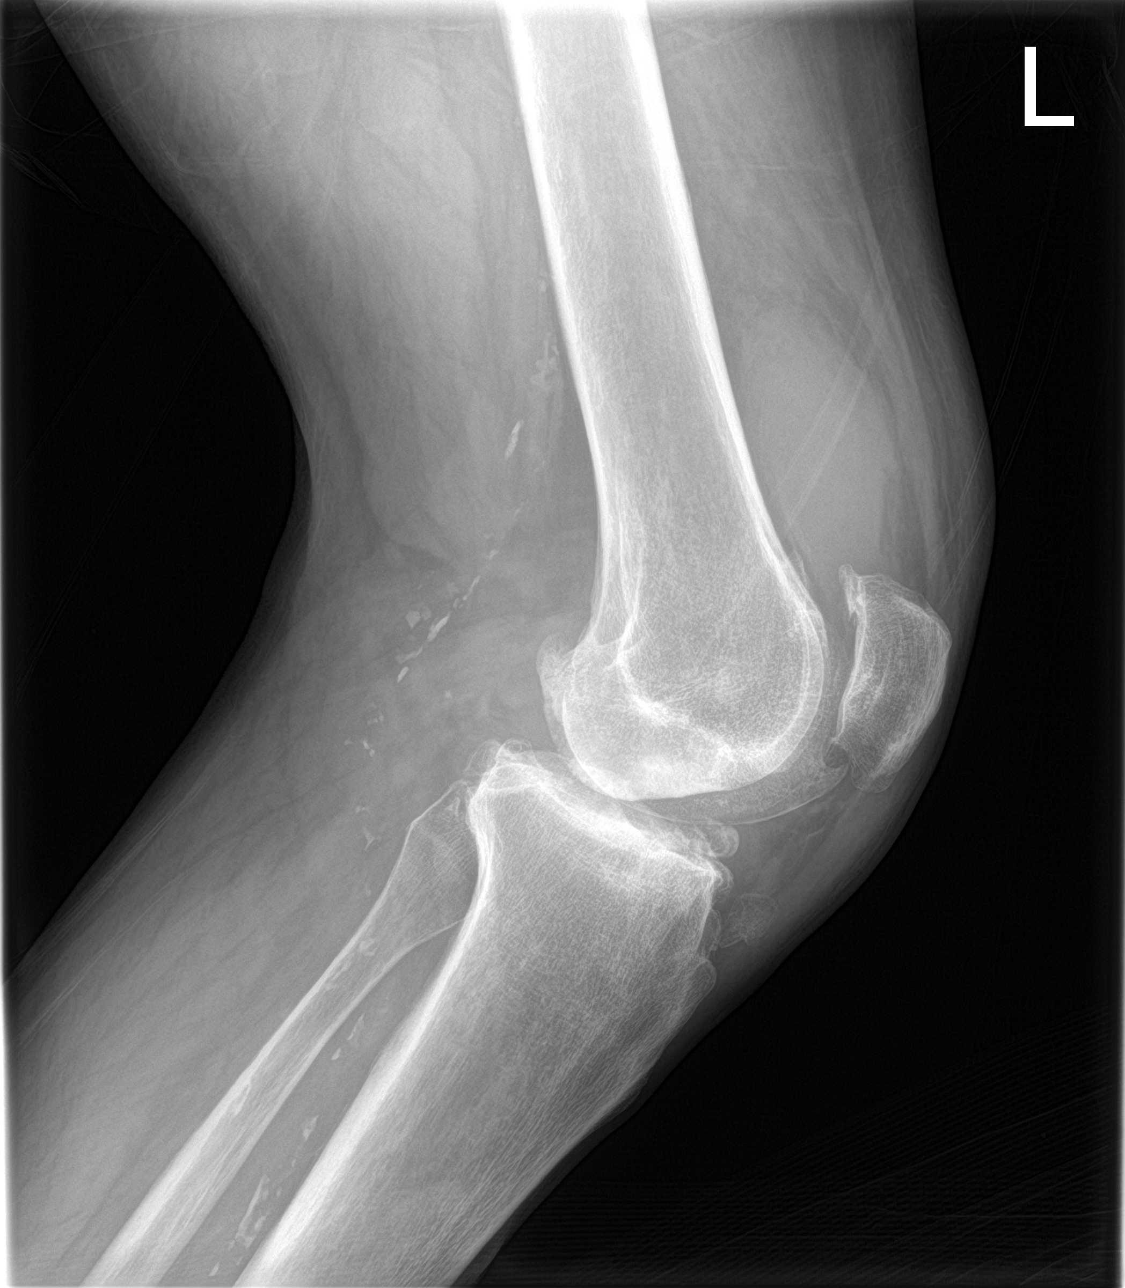

[4 of 4 positions shown; findings below may reference images not displayed]

FINDINGS: Chronic severe tricompartmental degeneration, with chronic
bone-on-bone appearance of the medial compartment. Bulky
osteophytosis. Chronic suprapatellar joint effusion, moderate and
somewhat increased since [47].

No acute osseous abnormality identified. Calcified peripheral
vascular disease.
IMPRESSION: 1. Chronic severe tricompartmental degeneration with a moderate
joint effusion which appears progressed since [47]. Chronic
bone-on-bone appearance of the medial compartment.
2. No acute osseous abnormality identified.
3. Calcified peripheral vascular disease.

## 2021-08-14 MED ORDER — TRAMADOL HCL 50 MG PO TABS: 50.0000 mg | ORAL_TABLET | Freq: Four times a day (QID) | ORAL | 0 refills | Status: DC | PRN

## 2021-08-14 MED ORDER — HYDROCODONE-ACETAMINOPHEN 5-325 MG PO TABS
1.0000 | ORAL_TABLET | Freq: Once | ORAL | Status: AC
  Administered 2021-08-14: 1 via ORAL
  Filled 2021-08-14: qty 1

## 2021-08-14 MED ORDER — MELOXICAM 15 MG PO TABS: 15.0000 mg | ORAL_TABLET | Freq: Every day | ORAL | 2 refills | Status: DC

## 2021-08-14 NOTE — ED Provider Notes (Signed)
[facility] Emergency Department Provider Note  ____________________________________________   Event Date/Time   First MD Initiated Contact with Patient 08/14/21 [phone]     (approximate)  I have reviewed the triage vital signs and the nursing notes.   HISTORY  Chief Complaint Knee Pain    HPI James [facility] is a 55 y.o. male presents emergency department complaint of left knee pain.  Patient denies injury.  States sometimes it pops and it becomes swollen.  History of same in the past.  Patient has history of hypertension is taking his medications.  Denies numbness or tingling  Past Medical History:  Diagnosis Date   Asthma    COPD (chronic obstructive pulmonary disease) (HCC)    Hidradenitis suppurativa    diagnosed in [facility] ED based on history and physical exam   Hypertension     There are no problems to display for this patient.   History reviewed. No pertinent surgical history.  Prior to Admission medications   Medication Sig Start Date End Date Taking? Authorizing Provider  meloxicam (MOBIC) 15 MG tablet Take 1 tablet (15 mg total) by mouth daily. 08/14/21 08/14/22 Yes Olliver Boyadjian, Roselyn Bering, PA-C  traMADol (ULTRAM) 50 MG tablet Take 1 tablet (50 mg total) by mouth every 6 (six) hours as needed. 08/14/21  Yes Atira Borello, Roselyn Bering, PA-C  amLODipine (NORVASC) 5 MG tablet Take 1 tablet (5 mg total) by mouth daily. 08/07/21 08/07/22  Loleta Rose, MD  hydrochlorothiazide (HYDRODIURIL) 25 MG tablet Take 1 tablet (25 mg total) by mouth daily. 11/17/20   Cuthriell, Delorise Royals, PA-C    Allergies Patient has no known allergies.  History reviewed. No pertinent family history.  Social History Social History   Tobacco Use   Smoking status: Every Day    Packs/day: 0.50    Types: Cigarettes   Smokeless tobacco: Never  Vaping Use   Vaping Use: Never used  Substance Use Topics   Alcohol use: Yes    Comment: daily   Drug use: Yes    Types: Marijuana    Review  of Systems  Constitutional: No fever/chills Eyes: No visual changes. ENT: No sore throat. Respiratory: Denies cough Cardiovascular: Denies chest pain Gastrointestinal: Denies abdominal pain Genitourinary: Negative for dysuria. Musculoskeletal: Negative for back pain.  Positive for left knee. Skin: Negative for rash. Psychiatric: no mood changes,     ____________________________________________   PHYSICAL EXAM:  VITAL SIGNS: ED Triage Vitals  Enc Vitals Group     BP 08/14/21 0639 (!) 166/100     Pulse Rate 08/14/21 0639 87     Resp 08/14/21 0639 20     Temp 08/14/21 0639 97.9 F (36.6 C)     Temp Source 08/14/21 0639 Oral     SpO2 08/14/21 0639 99 %     Weight 08/14/21 0638 (!) 325 lb (147.4 kg)     Height 08/14/21 0638 6\' 2"  (1.88 m)     Head Circumference --      Peak Flow --      Pain Score 08/14/21 0638 10     Pain Loc --      Pain Edu? --      Excl. in GC? --     Constitutional: Alert and oriented. Well appearing and in no acute distress. Eyes: Conjunctivae are normal.  Head: Atraumatic. Nose: No congestion/rhinnorhea. Mouth/Throat: Mucous membranes are moist.   Neck:  supple no lymphadenopathy noted Cardiovascular: Normal rate, regular rhythm.  Respiratory: Normal respiratory effort.  No retractions, GU: deferred Musculoskeletal: FROM all extremities, warm and well perfused, left knee is warm to the touch with some swelling noted, calf is nontender, thigh is nontender, neurovascular is intact Neurologic:  Normal speech and language.  Skin:  Skin is warm, dry and intact. No rash noted. Psychiatric: Mood and affect are normal. Speech and behavior are normal.  ____________________________________________   LABS (all labs ordered are listed, but only abnormal results are displayed)  Labs Reviewed - No data to display ____________________________________________   ____________________________________________  RADIOLOGY  X-ray of the left  knee  ____________________________________________   PROCEDURES  Procedure(s) performed: Knee immobilizer and crutches   Procedures    ____________________________________________   INITIAL IMPRESSION / ASSESSMENT AND PLAN / ED COURSE  Pertinent labs & imaging results that were available during my care of the patient were reviewed by me and considered in my medical decision making (see chart for details).   Patient is a 55 year old male presents emergency department with left knee pain.  See HPI.  Physical exam shows patient to appear stable.  Left knee is swollen and tender.  X-ray reviewed by me confirmed by radiology to have a joint effusion.  I do not feel that the patient has a septic joint as he is able to bend the knee to his chest.  I will place him in a knee immobilizer and given crutches to help reduce inflammation and swelling.  He was given prescription for meloxicam and tramadol.  Pain medication while here in the ED.  He is to apply ice to the knee.  Return emergency department worsening.  Follow-up with orthopedics if not improving in 2 to 3 days.  Discharged in stable condition     [facility] was evaluated in Emergency Department on 08/14/2021 for the symptoms described in the history of present illness. He was evaluated in the context of the global COVID-19 pandemic, which necessitated consideration that the patient might be at risk for infection with the SARS-CoV-2 virus that causes COVID-19. Institutional protocols and algorithms that pertain to the evaluation of patients at risk for COVID-19 are in a state of rapid change based on information released by regulatory bodies including the CDC and federal and state organizations. These policies and algorithms were followed during the patient's care in the ED.    As part of my medical decision making, I reviewed the following data within the electronic MEDICAL RECORD NUMBER Nursing notes reviewed and incorporated, Old chart  reviewed, Radiograph reviewed , Notes from prior ED visits, and [place] Controlled Substance Database  ____________________________________________   FINAL CLINICAL IMPRESSION(S) / ED DIAGNOSES  Final diagnoses:  Effusion of left knee      NEW MEDICATIONS STARTED DURING THIS VISIT:  New Prescriptions   MELOXICAM (MOBIC) 15 MG TABLET    Take 1 tablet (15 mg total) by mouth daily.   TRAMADOL (ULTRAM) 50 MG TABLET    Take 1 tablet (50 mg total) by mouth every 6 (six) hours as needed.     Note:  This document was prepared using Dragon voice recognition software and may include unintentional dictation errors.    Faythe Ghee, PA-C 08/14/21 0749    Gilles Chiquito, MD 08/14/21 [phone]

## 2021-08-14 NOTE — ED Triage Notes (Signed)
Pt to triage via w/c with no distress noted, brought in by EMS for c/o left knee pain; st hx of same where "it pops out of socket"

## 2021-08-14 NOTE — Discharge Instructions (Signed)
Follow up with orthopedics Apply ice to left knee Return if worsening Take medications as prescribed

## 2021-08-14 NOTE — ED Notes (Signed)
See triage note  Presents with left knee pain  States his knee pops out  Has had similar episodes in past  States pain was worse this am

## 2021-10-23 ENCOUNTER — Emergency Department
Admission: EM | Admit: 2021-10-23 | Discharge: 2021-10-23 | Disposition: A | Discharge status: Home / Self Care | Payer: Self-pay | Attending: Emergency Medicine | Admitting: Emergency Medicine

## 2021-10-23 ENCOUNTER — Other Ambulatory Visit: Payer: Self-pay

## 2021-10-23 ENCOUNTER — Encounter: Payer: Self-pay | Admitting: Emergency Medicine

## 2021-10-23 DIAGNOSIS — F17210 Nicotine dependence, cigarettes, uncomplicated: Secondary | ICD-10-CM | POA: Insufficient documentation

## 2021-10-23 DIAGNOSIS — J449 Chronic obstructive pulmonary disease, unspecified: Secondary | ICD-10-CM | POA: Insufficient documentation

## 2021-10-23 DIAGNOSIS — I10 Essential (primary) hypertension: Secondary | ICD-10-CM | POA: Insufficient documentation

## 2021-10-23 DIAGNOSIS — N5089 Other specified disorders of the male genital organs: Secondary | ICD-10-CM

## 2021-10-23 DIAGNOSIS — L0291 Cutaneous abscess, unspecified: Secondary | ICD-10-CM

## 2021-10-23 DIAGNOSIS — Z79899 Other long term (current) drug therapy: Secondary | ICD-10-CM | POA: Insufficient documentation

## 2021-10-23 DIAGNOSIS — N492 Inflammatory disorders of scrotum: Secondary | ICD-10-CM | POA: Insufficient documentation

## 2021-10-23 DIAGNOSIS — J45909 Unspecified asthma, uncomplicated: Secondary | ICD-10-CM | POA: Insufficient documentation

## 2021-10-23 MED ORDER — RIFAMPIN 300 MG PO CAPS: 600.0000 mg | ORAL_CAPSULE | Freq: Every day | ORAL | 0 refills | Status: AC

## 2021-10-23 MED ORDER — RIFAMPIN 300 MG PO CAPS
600.0000 mg | ORAL_CAPSULE | Freq: Every day | ORAL | 0 refills | Status: DC
  Filled 2021-10-23: qty 20, 10d supply, fill #0

## 2021-10-23 MED ORDER — CLINDAMYCIN HCL 300 MG PO CAPS
300.0000 mg | ORAL_CAPSULE | Freq: Two times a day (BID) | ORAL | 0 refills | Status: DC
  Filled 2021-10-23: qty 20, 10d supply, fill #0

## 2021-10-23 MED ORDER — CLINDAMYCIN HCL 300 MG PO CAPS: 300.0000 mg | ORAL_CAPSULE | Freq: Two times a day (BID) | ORAL | 0 refills | Status: AC

## 2021-10-23 NOTE — ED Notes (Addendum)
Pt to ED c/o abscess on Buttocks and inner groin that has occurred for 2 months. Pt was seen about a month ago here and prescribed antibiotics. Upon assessment abscesses are painful to touch,erythema, and purulent drainage.   Pt A&Ox4, NAD

## 2021-10-23 NOTE — ED Provider Notes (Signed)
[facility] Emergency Department Provider Note   ____________________________________________   Event Date/Time   First MD Initiated Contact with Patient 10/23/21 1354     (approximate)  I have reviewed the triage vital signs and the nursing notes.   HISTORY  Chief Complaint Abscess    HPI James Lambert is a 55 y.o. male with a history of hidradenitis suppurativa who presents for left thigh abscess  LOCATION: Left thigh DURATION: 4-5 days prior to arrival TIMING: Worsening since onset SEVERITY: Severe QUALITY: Abscess CONTEXT: Patient states that approximately 4-5 days prior to arrival he began noticing worsening pain, redness, and tenderness to palpation over the left anterior thigh next to the scrotum.  Approximately 24 hours prior to arrival this area opened with purulent drainage and slight improvement of pain. MODIFYING FACTORS: Any palpation worsens the pain over this thigh and is partially relieved at rest and with ibuprofen ASSOCIATED SYMPTOMS: Denies any fever/chills   Per medical record review, patient has history of hidradenitis suppurativa          Past Medical History:  Diagnosis Date   Asthma    COPD (chronic obstructive pulmonary disease) (HCC)    Hidradenitis suppurativa    diagnosed in [facility] ED based on history and physical exam   Hypertension     There are no problems to display for this patient.   History reviewed. No pertinent surgical history.  Prior to Admission medications   Medication Sig Start Date End Date Taking? Authorizing Provider  amLODipine (NORVASC) 5 MG tablet Take 1 tablet (5 mg total) by mouth daily. 08/07/21 08/07/22  Loleta Rose, MD  clindamycin (CLEOCIN) 300 MG capsule Take 1 capsule (300 mg total) by mouth 2 (two) times daily for 10 days. 10/23/21 11/02/21  Merwyn Katos, MD  hydrochlorothiazide (HYDRODIURIL) 25 MG tablet Take 1 tablet (25 mg total) by mouth daily. 11/17/20   Cuthriell,  Delorise Royals, PA-C  meloxicam (MOBIC) 15 MG tablet Take 1 tablet (15 mg total) by mouth daily. 08/14/21 08/14/22  Fisher, Roselyn Bering, PA-C  rifampin (RIFADIN) 300 MG capsule Take 2 capsules (600 mg total) by mouth daily for 10 days. 10/23/21 11/02/21  Merwyn Katos, MD  traMADol (ULTRAM) 50 MG tablet Take 1 tablet (50 mg total) by mouth every 6 (six) hours as needed. 08/14/21   Faythe Ghee, PA-C    Allergies Patient has no known allergies.  History reviewed. No pertinent family history.  Social History Social History   Tobacco Use   Smoking status: Every Day    Packs/day: 0.50    Types: Cigarettes   Smokeless tobacco: Never  Vaping Use   Vaping Use: Never used  Substance Use Topics   Alcohol use: Yes    Comment: daily   Drug use: Yes    Types: Marijuana    Review of Systems Constitutional: No fever/chills Eyes: No visual changes. ENT: No sore throat. Cardiovascular: Denies chest pain. Respiratory: Denies shortness of breath. Gastrointestinal: No abdominal pain.  No nausea, no vomiting.  No diarrhea. Genitourinary: Negative for dysuria. Musculoskeletal: Negative for acute arthralgias Skin: Positive for abscess to the left inner thigh Neurological: Negative for headaches, weakness/numbness/paresthesias in any extremity Psychiatric: Negative for suicidal ideation/homicidal ideation   ____________________________________________   PHYSICAL EXAM:  VITAL SIGNS: ED Triage Vitals  Enc Vitals Group     BP 10/23/21 1117 (!) 151/101     Pulse Rate 10/23/21 1117 95     Resp 10/23/21 1117 20  Temp 10/23/21 1117 98.8 F (37.1 C)     Temp Source 10/23/21 1117 Oral     SpO2 10/23/21 1117 97 %     Weight 10/23/21 1117 250 lb (113.4 kg)     Height 10/23/21 1117 6\' 2"  (1.88 m)     Head Circumference --      Peak Flow --      Pain Score 10/23/21 1120 10     Pain Loc --      Pain Edu? --      Excl. in GC? --    Constitutional: Alert and oriented. Well appearing and in no  acute distress. Eyes: Conjunctivae are normal. PERRL. Head: Atraumatic. Nose: No congestion/rhinnorhea. Mouth/Throat: Mucous membranes are moist. Neck: No stridor Cardiovascular: Grossly normal heart sounds.  Good peripheral circulation. Respiratory: Normal respiratory effort.  No retractions. Gastrointestinal: Soft and nontender. No distention. Musculoskeletal: No obvious deformities Neurologic:  Normal speech and language. No gross focal neurologic deficits are appreciated. Skin: Multiple areas of swelling and induration along the perineum and the left inner thigh with an open and draining abscess approximately.  Skin is warm and dry. No rash noted. Psychiatric: Mood and affect are normal. Speech and behavior are normal.  ____________________________________________   LABS (all labs ordered are listed, but only abnormal results are displayed)  Labs Reviewed - No data to display   PROCEDURES  Procedure(s) performed (including Critical Care):  .1-3 Lead EKG Interpretation Performed by: 10/25/21, MD Authorized by: Merwyn Katos, MD     Interpretation: normal     ECG rate:  94   ECG rate assessment: normal     Rhythm: sinus rhythm     Ectopy: none     Conduction: normal     ____________________________________________   INITIAL IMPRESSION / ASSESSMENT AND PLAN / ED COURSE  As part of my medical decision making, I reviewed the following data within the electronic medical record, if available:  Nursing notes reviewed and incorporated, Labs reviewed, EKG interpreted, Old chart reviewed, Radiograph reviewed and Notes from prior ED visits reviewed and incorporated        Presentation most consistent with simple Abscess. Given History, Exam, and Workup I have low suspicion for Cellulitis, Necrotizing Fasciitis, Pyomyositis, Sporotrichosis, Osteomyelitis or other emergent problem as a cause for this presentation. Interventions:  [none necessary as wound is already  open and draining] I have low suspicion for necrotizing fasciitis at this time as patient's vital signs stable, he is afebrile, and pain is controlled Rx: Clindamycin, rifampin Disposition: Patient is stable for discharge, advised to follow up with primary care physician in 48 hours.      ____________________________________________   FINAL CLINICAL IMPRESSION(S) / ED DIAGNOSES  Final diagnoses:  Abscess  Scrotal swelling     ED Discharge Orders          Ordered    clindamycin (CLEOCIN) 300 MG capsule  2 times daily,   Status:  Discontinued        10/23/21 1424    rifampin (RIFADIN) 300 MG capsule  Daily,   Status:  Discontinued        10/23/21 1424    rifampin (RIFADIN) 300 MG capsule  Daily        10/23/21 1425    clindamycin (CLEOCIN) 300 MG capsule  2 times daily        10/23/21 1425             Note:  This document was prepared using  [employer] and may include unintentional dictation errors.    Merwyn Katos, MD 10/23/21 [phone]

## 2021-10-23 NOTE — ED Triage Notes (Signed)
Pt comes into the ED via POV c/o abscess to the left inner thigh and the top of the buttocks.  PT states he has a h/o of them.  PT in NAD at this time with even and unlabored respirations.

## 2021-10-24 ENCOUNTER — Other Ambulatory Visit: Payer: Self-pay

## 2021-10-24 MED ORDER — RIFAMPIN 300 MG PO CAPS
600.0000 mg | ORAL_CAPSULE | Freq: Every day | ORAL | 0 refills | Status: DC
  Filled 2021-10-24: qty 20, 10d supply, fill #0

## 2021-10-24 MED ORDER — CLINDAMYCIN HCL 300 MG PO CAPS
ORAL_CAPSULE | Freq: Two times a day (BID) | ORAL | 0 refills | Status: DC
  Filled 2021-10-24: qty 20, 10d supply, fill #0

## 2022-01-01 ENCOUNTER — Encounter: Payer: Self-pay | Admitting: Emergency Medicine

## 2022-01-01 ENCOUNTER — Other Ambulatory Visit: Payer: Self-pay

## 2022-01-01 ENCOUNTER — Emergency Department: Payer: Self-pay

## 2022-01-01 ENCOUNTER — Emergency Department
Admission: EM | Admit: 2022-01-01 | Discharge: 2022-01-01 | Disposition: A | Discharge status: Home / Self Care | Payer: Self-pay | Attending: Emergency Medicine | Admitting: Emergency Medicine

## 2022-01-01 DIAGNOSIS — F1010 Alcohol abuse, uncomplicated: Secondary | ICD-10-CM | POA: Insufficient documentation

## 2022-01-01 DIAGNOSIS — L0390 Cellulitis, unspecified: Secondary | ICD-10-CM

## 2022-01-01 DIAGNOSIS — Z5989 Other problems related to housing and economic circumstances: Secondary | ICD-10-CM

## 2022-01-01 DIAGNOSIS — I10 Essential (primary) hypertension: Secondary | ICD-10-CM | POA: Insufficient documentation

## 2022-01-01 DIAGNOSIS — J449 Chronic obstructive pulmonary disease, unspecified: Secondary | ICD-10-CM | POA: Insufficient documentation

## 2022-01-01 DIAGNOSIS — L03314 Cellulitis of groin: Secondary | ICD-10-CM | POA: Insufficient documentation

## 2022-01-01 DIAGNOSIS — R42 Dizziness and giddiness: Secondary | ICD-10-CM | POA: Insufficient documentation

## 2022-01-01 DIAGNOSIS — Z720 Tobacco use: Secondary | ICD-10-CM

## 2022-01-01 DIAGNOSIS — R062 Wheezing: Secondary | ICD-10-CM | POA: Insufficient documentation

## 2022-01-01 DIAGNOSIS — F17200 Nicotine dependence, unspecified, uncomplicated: Secondary | ICD-10-CM | POA: Insufficient documentation

## 2022-01-01 DIAGNOSIS — Z79899 Other long term (current) drug therapy: Secondary | ICD-10-CM | POA: Insufficient documentation

## 2022-01-01 DIAGNOSIS — Z760 Encounter for issue of repeat prescription: Secondary | ICD-10-CM | POA: Insufficient documentation

## 2022-01-01 LAB — CBC
HCT: 40.4 % (ref 39.0–52.0)
Hemoglobin: 13.1 g/dL (ref 13.0–17.0)
MCH: 31.4 pg (ref 26.0–34.0)
MCHC: 32.4 g/dL (ref 30.0–36.0)
MCV: 96.9 fL (ref 80.0–100.0)
Platelets: 452 10*3/uL — ABNORMAL HIGH (ref 150–400)
RBC: 4.17 MIL/uL — ABNORMAL LOW (ref 4.22–5.81)
RDW: 15.6 % — ABNORMAL HIGH (ref 11.5–15.5)
WBC: 6 10*3/uL (ref 4.0–10.5)
nRBC: 0 % (ref 0.0–0.2)

## 2022-01-01 LAB — BASIC METABOLIC PANEL
Anion gap: 9 (ref 5–15)
BUN: 11 mg/dL (ref 6–20)
CO2: 23 mmol/L (ref 22–32)
Calcium: 9 mg/dL (ref 8.9–10.3)
Chloride: 100 mmol/L (ref 98–111)
Creatinine, Ser: 1.15 mg/dL (ref 0.61–1.24)
GFR, Estimated: >60 mL/min (ref >60)
Glucose, Bld: 78 mg/dL (ref 70–99)
Potassium: 4.1 mmol/L (ref 3.5–5.1)
Sodium: 132 mmol/L — ABNORMAL LOW (ref 135–145)

## 2022-01-01 LAB — TROPONIN I (HIGH SENSITIVITY): Troponin I (High Sensitivity): 4 ng/L (ref <18)

## 2022-01-01 IMAGING — CT CT HEAD W/O CM
4 series · 16 of 47 positions shown, 18 images · non-contrast
Comparison: None.

CLINICAL DATA: Dizziness.



[Series 2: head wo · axial · 0.46mm/px · z∈[+174,+294]mm · 7 of 33 slices shown, 9 images]
[im 5/33  brain]
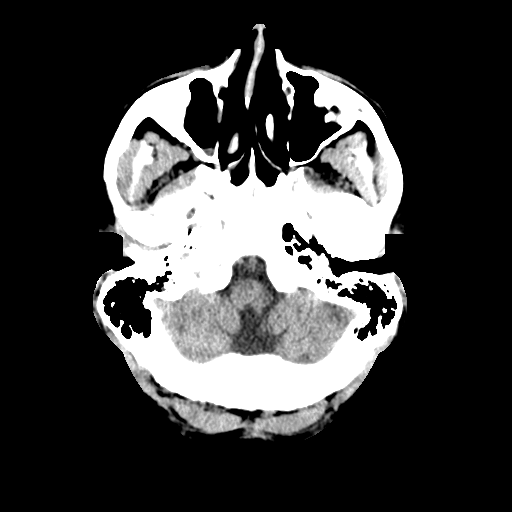
[im 5/33  bone]
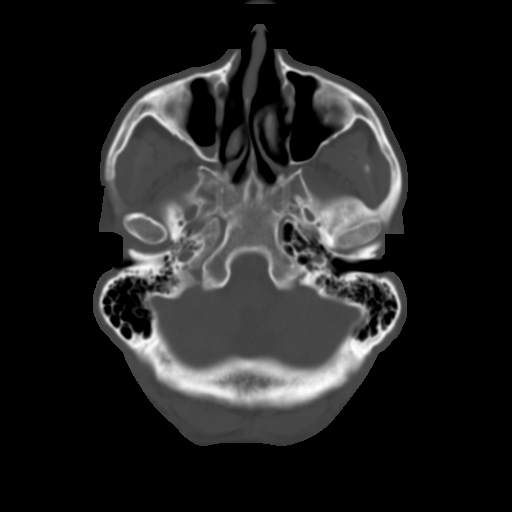
[im 9/33  brain]
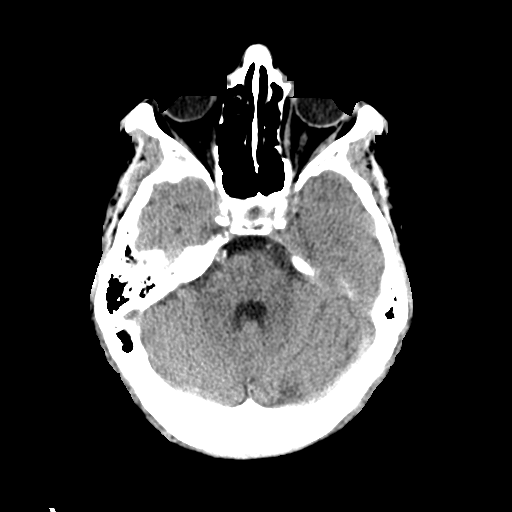
[im 13/33  brain]
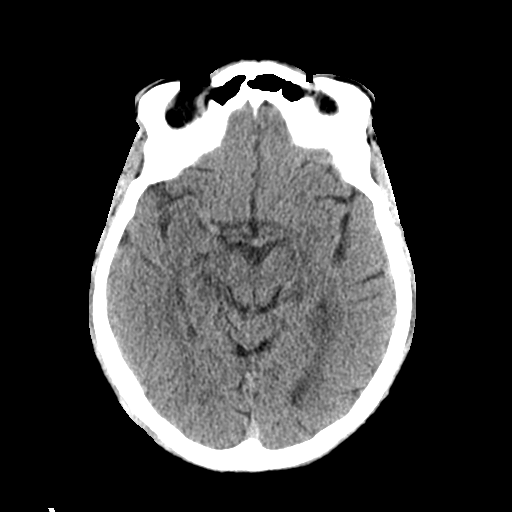
[im 17/33  brain]
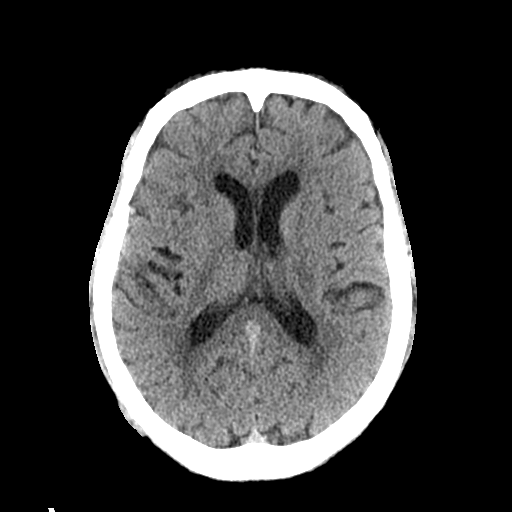
[im 21/33  brain]
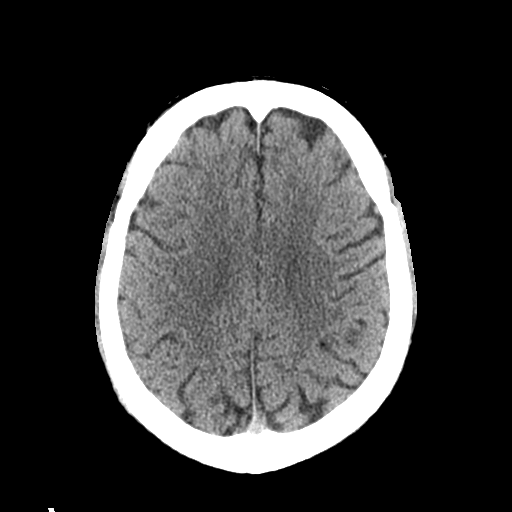
[im 21/33  bone]
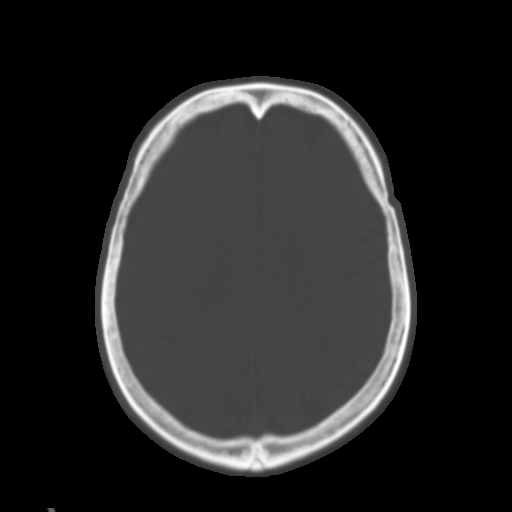
[im 25/33  brain]
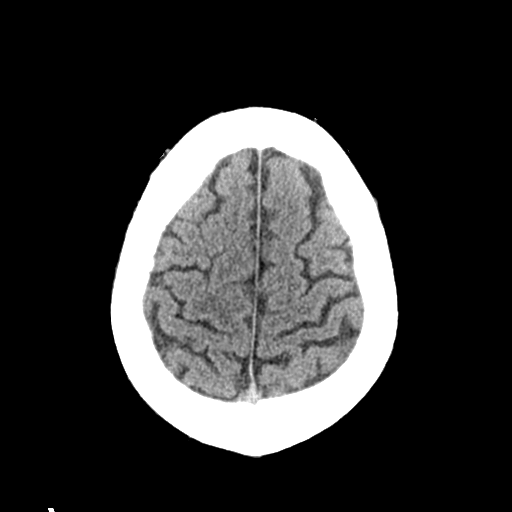
[im 29/33  brain]
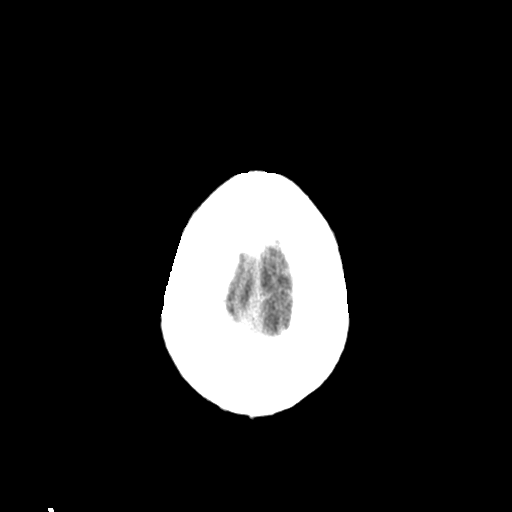

[Series 3: head bone · axial · 0.46mm/px · z∈[+170,+202]mm · 3 of 82 slices shown]
[im 9/82  bone]
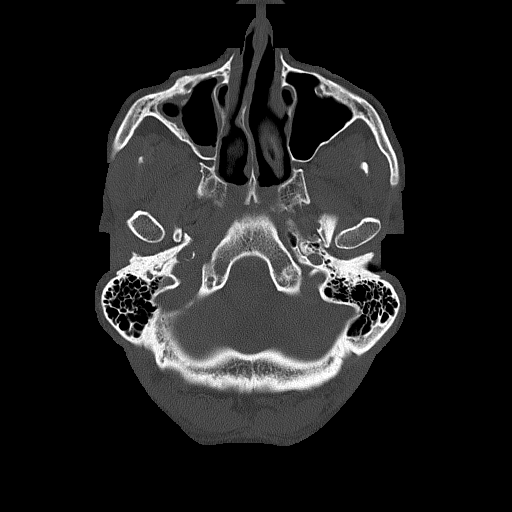
[im 17/82  bone]
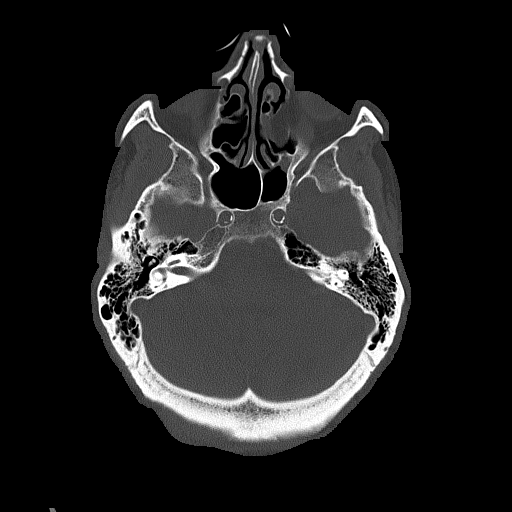
[im 25/82  bone]
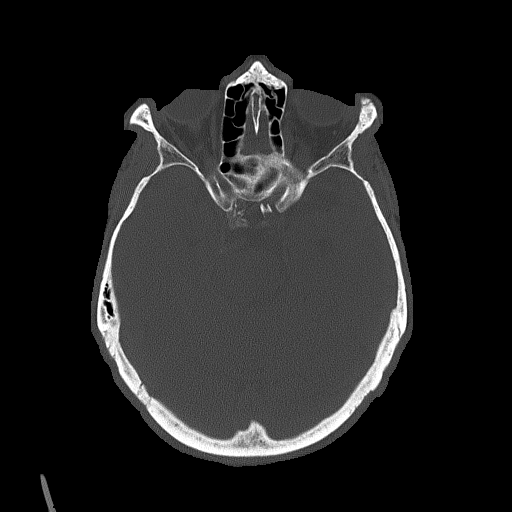

[Series 4: coronal soft tissue · coronal · 0.33mm/px · 3 of 70 slices shown]
[im 24/70  brain]
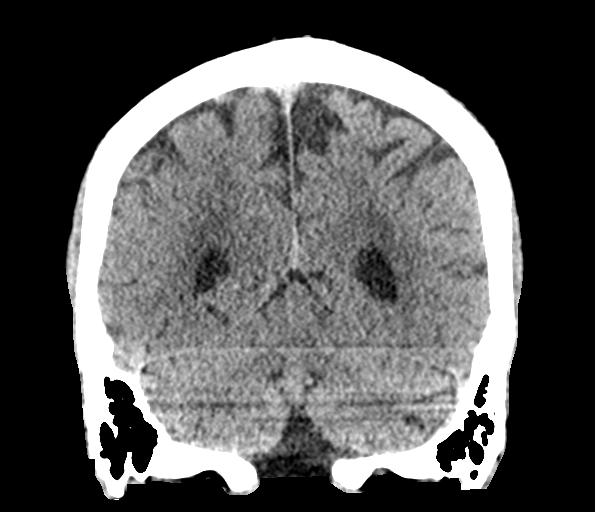
[im 31/70  brain]
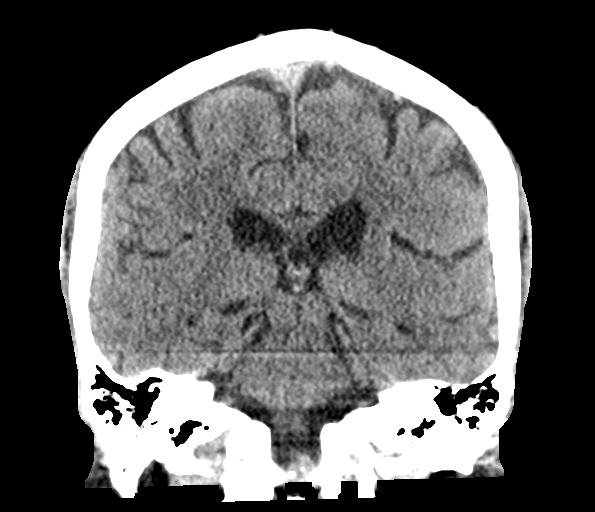
[im 39/70  brain]
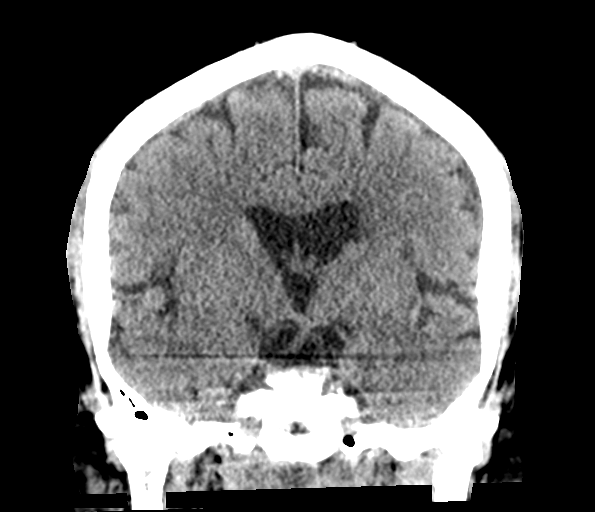

[Series 5: sagittal soft tissue · sagittal · 0.34mm/px · 3 of 56 slices shown]
[im 19/56  brain]
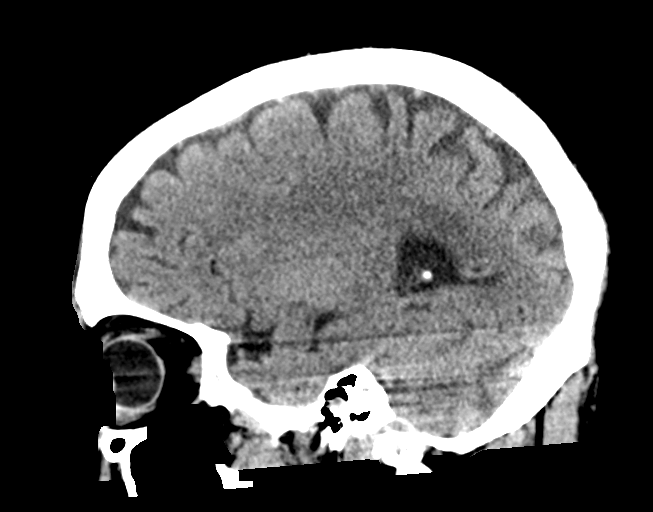
[im 28/56  brain]
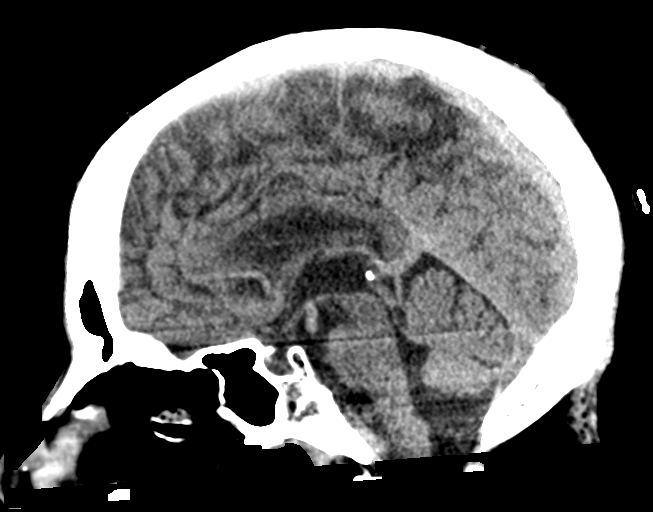
[im 37/56  brain]
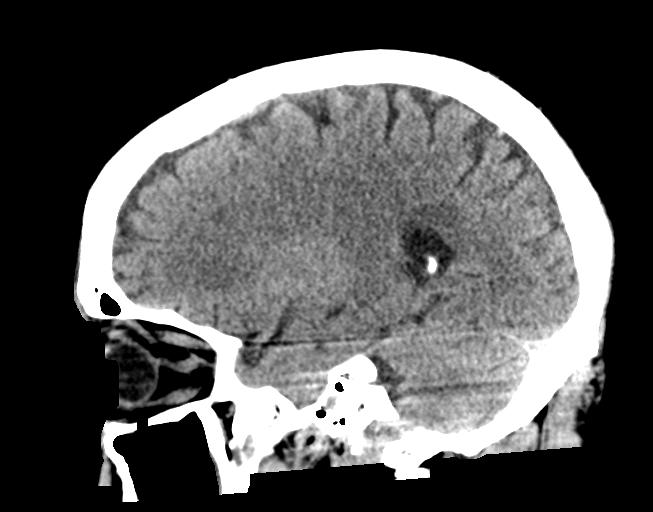

[16 of 47 positions shown; findings below may reference images not displayed]

FINDINGS: Brain: No evidence of acute infarction, hemorrhage, hydrocephalus,
extra-axial collection or mass lesion/mass effect.

Vascular: No hyperdense vessel or unexpected calcification.

Skull: Normal. Negative for fracture or focal lesion.

Sinuses/Orbits: No acute finding.

Other: None.
IMPRESSION: No acute intracranial abnormality seen.

## 2022-01-01 MED ORDER — FOLIC ACID 1 MG PO TABS: 1.0000 mg | ORAL_TABLET | Freq: Every day | ORAL | Status: DC

## 2022-01-01 MED ORDER — DOXYCYCLINE HYCLATE 100 MG PO CAPS
100.0000 mg | ORAL_CAPSULE | Freq: Two times a day (BID) | ORAL | 0 refills | Status: AC
  Filled 2022-01-01: qty 20, 10d supply, fill #0

## 2022-01-01 MED ORDER — ALBUTEROL SULFATE HFA 108 (90 BASE) MCG/ACT IN AERS
1.0000 | INHALATION_SPRAY | Freq: Once | RESPIRATORY_TRACT | Status: AC | PRN
  Administered 2022-01-01: 18:00:00 1 via RESPIRATORY_TRACT
  Filled 2022-01-01: qty 6.7

## 2022-01-01 MED ORDER — THIAMINE HCL 100 MG PO TABS: 100.0000 mg | ORAL_TABLET | Freq: Every day | ORAL | Status: DC

## 2022-01-01 MED ORDER — LORAZEPAM 1 MG PO TABS: 1.0000 mg | ORAL_TABLET | ORAL | Status: DC | PRN

## 2022-01-01 MED ORDER — LORAZEPAM 2 MG/ML IJ SOLN: 1.0000 mg | INTRAMUSCULAR | Status: DC | PRN

## 2022-01-01 MED ORDER — AMLODIPINE BESYLATE 5 MG PO TABS
5.0000 mg | ORAL_TABLET | Freq: Every day | ORAL | 0 refills | Status: DC
  Filled 2022-01-01: qty 30, 30d supply, fill #0

## 2022-01-01 MED ORDER — AMLODIPINE BESYLATE 5 MG PO TABS
5.0000 mg | ORAL_TABLET | Freq: Once | ORAL | Status: AC
Start: 2022-01-01 — End: 2022-01-01
  Administered 2022-01-01: 18:00:00 5 mg via ORAL
  Filled 2022-01-01: qty 1

## 2022-01-01 MED ORDER — DOXYCYCLINE HYCLATE 100 MG PO TABS
100.0000 mg | ORAL_TABLET | Freq: Once | ORAL | Status: AC
Start: 2022-01-01 — End: 2022-01-01
  Administered 2022-01-01: 18:00:00 100 mg via ORAL
  Filled 2022-01-01: qty 1

## 2022-01-01 MED ORDER — THIAMINE HCL 100 MG/ML IJ SOLN: 100.0000 mg | Freq: Every day | INTRAMUSCULAR | Status: DC

## 2022-01-01 MED ORDER — ADULT MULTIVITAMIN W/MINERALS CH: 1.0000 | ORAL_TABLET | Freq: Every day | ORAL | Status: DC

## 2022-01-01 MED ORDER — ALBUTEROL SULFATE HFA 108 (90 BASE) MCG/ACT IN AERS
2.0000 | INHALATION_SPRAY | Freq: Four times a day (QID) | RESPIRATORY_TRACT | 2 refills | Status: DC | PRN
  Filled 2022-01-01: qty 8.5, 25d supply, fill #0

## 2022-01-01 NOTE — ED Triage Notes (Addendum)
Pt comes into the ED via POV c/o HTN and being out of his medication for about 1 month.  Pt states he noticed that when he was taking the trash out today, he noticed that he was starting to get lightheaded.  Pt does admit to dizziness x 1-2 weeks. Pt currently is neurologically intact.  Pt denies any chest pain, but admits to some [facility] as well.

## 2022-01-01 NOTE — ED Provider Notes (Signed)
[facility] Provider Note    Event Date/Time   First MD Initiated Contact with Patient 01/01/22 1658     (approximate)   History   Dizziness and Hypertension   HPI  James Lambert is a 56 y.o. male with a past medical history of COPD and ongoing tobacco abuse, EtOH abuse drinking approximately 4 beers and some liquor unspecified amount per day, chronic lower extremity arthritis using a walker at baseline, high blood pressure, not taking medications for 2 or 3 weeks and chronic cellulitis and recurrent abscesses in the left groin most recently evaluated emergency room on 10/23/2021 who presents for evaluation of multiple complaints.  Patient states that over the last 2 or 3 weeks he has been feeling a little bit dizzy.  States these episodes last for a minute and come and go but that he is not currently dizzy and has not been physically dizzy today or yesterday.  This is not any different today or yesterday compared to the last 2 or 3 weeks.  He states he thinks it is because that is when his blood pressure medicine ran out.  States he has not been able to follow-up with a PCP due to insurance and financial issues.  He last had his blood pressure medicine prescribed in the emergency room.  States he also will have some intermittent wheezing with exertion and has a chronic cough at baseline but this not a different today than usual.  He states he needs a refill of her albuterol inhaler.  No fevers, chest pain, Donnell pain, vomiting, diarrhea or urinary symptoms.  He endorses some THC use but no illicit drug use.  No recent injuries or falls.  He states the rash redness and some tenderness in the left groin area has not gotten better since he was last seen emergency room despite completing course of clindamycin.  He states it is about the same as it has been over the last couple years.  It is draining some yellow fluid.  He states he thinks he needs antibiotics for this.  No  other acute concerns at this time.      Physical Exam  Triage Vital Signs: ED Triage Vitals  Enc Vitals Group     BP 01/01/22 1503 (!) 200/142     Pulse Rate 01/01/22 1503 83     Resp 01/01/22 1503 20     Temp 01/01/22 1503 98.2 F (36.8 C)     Temp Source 01/01/22 1503 Oral     SpO2 01/01/22 1503 97 %     Weight 01/01/22 1502 250 lb (113.4 kg)     Height 01/01/22 1502 6\' 2"  (1.88 m)     Head Circumference --      Peak Flow --      Pain Score 01/01/22 1502 0     Pain Loc --      Pain Edu? --      Excl. in GC? --     Most recent vital signs: Vitals:   01/01/22 1503 01/01/22 1743  BP: (!) 200/142 (!) 205/137  Pulse: 83 79  Resp: 20 18  Temp: 98.2 F (36.8 C)   SpO2: 97% 97%    General: Awake, no distress.  CV:  Good peripheral perfusion.  No murmurs rubs or gallops.  2+ radial pulses. Resp:  Normal effort.  Clear bilaterally without increased effort.  No evidence of hypoxia.  No wheezing rhonchi or rales. Abd:  No distention.  Soft  throughout. Other:  Cranial nerves II through XII grossly intact.  No pronator drift.  No finger dysmetria.  Symmetric 5/5 strength of all extremities with the exception of the bilateral knees where patient has some slight weakness which he states is chronic from his arthritis..  Sensation intact to light touch in all extremities.  Unremarkable gait using a cane.  There is some erythema and multiple draining punctate lesions draining purulent fluid in the left groin area.  There is very small areas of purulence less than 0.1 cm in diameter.  No bleeding.   ED Results / Procedures / Treatments  Labs (all labs ordered are listed, but only abnormal results are displayed) Labs Reviewed  BASIC METABOLIC PANEL - Abnormal; Notable for the following components:      Result Value   Sodium 132 (*)    All other components within normal limits  CBC - Abnormal; Notable for the following components:   RBC 4.17 (*)    RDW 15.6 (*)    Platelets 452  (*)    All other components within normal limits  TROPONIN I (HIGH SENSITIVITY)     EKG   ECG is remarkable sinus rhythm with nonspecific change in V2 without other clear evidence of acute ischemia or significant arrhythmia.  Normal axis and unremarkable intervals.  RADIOLOGY  CT head without contrast reviewed by myself shows no evidence of hemorrhage, ischemia, mass-effect, edema or other clear acute process.  I also reviewed radiology's interpretation and agree with their findings.   PROCEDURES:   MEDICATIONS ORDERED IN ED: Medications  LORazepam (ATIVAN) tablet 1-4 mg (has no administration in time range)    Or  LORazepam (ATIVAN) injection 1-4 mg (has no administration in time range)  thiamine tablet 100 mg (100 mg Oral Patient Refused/Not Given 01/01/22 1744)    Or  thiamine (B-1) injection 100 mg ( Intravenous See Alternative 01/01/22 1744)  folic acid (FOLVITE) tablet 1 mg (1 mg Oral Patient Refused/Not Given 01/01/22 1744)  multivitamin with minerals tablet 1 tablet (1 tablet Oral Patient Refused/Not Given 01/01/22 1744)  amLODipine (NORVASC) tablet 5 mg (5 mg Oral Given 01/01/22 1749)  doxycycline (VIBRA-TABS) tablet 100 mg (100 mg Oral Given 01/01/22 1749)  albuterol (VENTOLIN HFA) 108 (90 Base) MCG/ACT inhaler 1-2 puff (1 puff Inhalation Given 01/01/22 1749)     IMPRESSION / MDM / ASSESSMENT AND PLAN / ED COURSE  I reviewed the triage vital signs and the nursing notes.                              With regard to patient's reported intermittent dizziness over the last 2 or 3 weeks differential diagnosis includes, but is not limited to hypertensive urgency, CVA, metabolic derangements, nutritional deficiencies, arrhythmia, atypical presentation for ACS with very low suspicion for dissection or PE given absence of any reported chest pain or shortness of breath and chronic cough not any different than usual.  In addition patient has no fevers or other findings on his history  exam to suggest bacterial pneumonia or acute thoracic infectious process.  I suspect his chronic cough is from his tobacco abuse and I counseled him on this.  Place no wheezing he does request refills albuterol he was given his albuterol Hailer in the emergency room to take home and see has no money or current insurance we will try to get into free clinic to get future refills of this tomorrow.  No  acute focal neurological deficits at this time to suggest CVA.  ECG is remarkable sinus rhythm with nonspecific change in V2 without other clear evidence of acute ischemia or significant arrhythmia.  Normal axis and unremarkable intervals.  Troponin nonelevated nonsuggestive of ACS.  CT head without contrast reviewed by myself shows no evidence of hemorrhage, ischemia, mass-effect, edema or other clear acute process.  I also reviewed radiology's interpretation and agree with their findings.  While he is quite hypertensive emergency room today states he has no current dizziness and has a completely nonfocal neurological exam.  Do not believe requires any additional testing or emergent lowering although discussed vital importance of restarting blood pressure medications and close outpatient follow-up.  CBC without leukocytosis or acute anemia.  BMP without any significant electrolyte or metabolic derangements.  Kidney function is within normal limits.  With regard to the findings in the left groin this is consistent with likely hidradenitis with ongoing drainage from a couple open wounds.  Knowing that her amenable to I&D at this time although I do think it is appropriate to restart patient on antibiotics and I will start patient on doxycycline.  I do not believe he is septic and have a low suspicion for other deeper space or systemic infection at this time.  I will send a prescription for albuterol, doxycycline and amlodipine to medication management pharmacy for premedication so the patient get these  tomorrow.  Discussed importance of tobacco cessation and decreasing his alcohol use given his alcohol abuse is likely contributing to his high blood pressure and putting him at high risk for cancer and other serious morbidity.      FINAL CLINICAL IMPRESSION(S) / ED DIAGNOSES   Final diagnoses:  Hypertension, unspecified type  Tobacco abuse  Alcohol abuse  Cellulitis, unspecified cellulitis site  Medication refill  Underinsured     Rx / DC Orders   ED Discharge Orders          Ordered    doxycycline (VIBRAMYCIN) 100 MG capsule  2 times daily        01/01/22 1738    albuterol (VENTOLIN HFA) 108 (90 Base) MCG/ACT inhaler  Every 6 hours PRN        01/01/22 1738    amLODipine (NORVASC) 5 MG tablet  Daily        01/01/22 1738             Note:  This document was prepared using Dragon voice recognition software and may include unintentional dictation errors.   Gilles ChiquitoSmith, Shelton Soler P, MD 01/01/22 [id number]

## 2022-01-02 ENCOUNTER — Other Ambulatory Visit: Payer: Self-pay

## 2022-01-13 ENCOUNTER — Other Ambulatory Visit: Payer: Self-pay

## 2022-02-19 ENCOUNTER — Other Ambulatory Visit: Payer: Self-pay

## 2022-02-21 ENCOUNTER — Other Ambulatory Visit: Payer: Self-pay

## 2022-02-21 ENCOUNTER — Emergency Department
Admission: EM | Admit: 2022-02-21 | Discharge: 2022-02-21 | Disposition: A | Discharge status: Home / Self Care | Payer: Medicaid Other | Attending: Emergency Medicine | Admitting: Emergency Medicine

## 2022-02-21 ENCOUNTER — Encounter: Payer: Self-pay | Admitting: Emergency Medicine

## 2022-02-21 DIAGNOSIS — J45909 Unspecified asthma, uncomplicated: Secondary | ICD-10-CM | POA: Diagnosis not present

## 2022-02-21 DIAGNOSIS — I10 Essential (primary) hypertension: Secondary | ICD-10-CM | POA: Insufficient documentation

## 2022-02-21 DIAGNOSIS — M545 Low back pain, unspecified: Secondary | ICD-10-CM | POA: Diagnosis present

## 2022-02-21 DIAGNOSIS — J449 Chronic obstructive pulmonary disease, unspecified: Secondary | ICD-10-CM | POA: Diagnosis not present

## 2022-02-21 DIAGNOSIS — L732 Hidradenitis suppurativa: Secondary | ICD-10-CM | POA: Diagnosis not present

## 2022-02-21 MED ORDER — DOXYCYCLINE MONOHYDRATE 100 MG PO CAPS
100.0000 mg | ORAL_CAPSULE | Freq: Two times a day (BID) | ORAL | 0 refills | Status: DC
  Filled 2022-02-21: qty 14, 7d supply, fill #0

## 2022-02-21 MED ORDER — AMLODIPINE BESYLATE 5 MG PO TABS
5.0000 mg | ORAL_TABLET | Freq: Every day | ORAL | 2 refills | Status: DC
  Filled 2022-02-21: qty 30, 30d supply, fill #0

## 2022-02-21 NOTE — ED Notes (Signed)
See triage note  presents with possible abscess areas   states he has 2 areas  both areas are draining  also has been out of his b/p meds for about 1 week ?

## 2022-02-21 NOTE — Discharge Instructions (Signed)
Please take the antibiotic twice daily for the next 7 days; if you are unable to see dermatology through [place] you can call [facility] to schedule an appointment at [phone] ?

## 2022-02-21 NOTE — ED Triage Notes (Signed)
Pt to ED via POV from home. Pt reports abscess on groin and right buttock. Pt states normally abscess will bust on their on but states this time abscess are more painful. Pt also reports has been out of BP medication x1 week. Pt denies CP or SOB  ?

## 2022-02-21 NOTE — ED Provider Notes (Signed)
? ?[facility] ?Provider Note ? ? ? Event Date/Time  ? First MD Initiated Contact with Patient 02/21/22 1132   ?  (approximate) ? ? ?History  ? ?Abscess ? ? ?HPI ? ?James Lambert is a 56 y.o. male with past medical history of hidradenitis, COPD, asthma, hypertension who presents with abscesses.  Patient has an abscess on the left buttock and left inguinal region.  Notes that they have been here for several months.  Had a prescription for antibiotics that he completed several weeks ago.  He has ongoing drainage from the region.  Has mild pain denies fevers or chills.  Has not followed up with primary care for this.  He is not aware of any diagnosis of hidradenitis ?  ? ?Past Medical History:  ?Diagnosis Date  ? Asthma   ? COPD (chronic obstructive pulmonary disease) (HCC)   ? Hidradenitis suppurativa   ? diagnosed in [facility] ED based on history and physical exam  ? Hypertension   ? ? ?There are no problems to display for this patient. ? ? ? ?Physical Exam  ?Triage Vital Signs: ?ED Triage Vitals  ?Enc Vitals Group  ?   BP 02/21/22 1140 (!) 207/123  ?   Pulse Rate 02/21/22 1140 90  ?   Resp 02/21/22 1140 18  ?   Temp 02/21/22 1140 98.7 ?F (37.1 ?C)  ?   Temp Source 02/21/22 1140 Oral  ?   SpO2 02/21/22 1140 98 %  ?   Weight 02/21/22 1137 250 lb (113.4 kg)  ?   Height 02/21/22 1137 6\' 2"  (1.88 m)  ?   Head Circumference --   ?   Peak Flow --   ?   Pain Score 02/21/22 1137 7  ?   Pain Loc --   ?   Pain Edu? --   ?   Excl. in GC? --   ? ? ?Most recent vital signs: ?Vitals:  ? 02/21/22 1210 02/21/22 1408  ?BP: (!) 181/114 (!) 152/109  ?Pulse: 89   ?Resp: 18   ?Temp:    ?SpO2: 95%   ? ? ? ?General: Awake, no distress.  ?CV:  Good peripheral perfusion.  ?Resp:  Normal effort.  ?Abd:  No distention.  ?Neuro:             Awake, Alert, Oriented x 3  ?Other:  Left buttock with thickened somewhat indurated skin and tunneling, no purulent drainage, nontender no erythema or fluctuance ?Left groin with similar  appearing skin with several tunneling areas with purulent discharge but no fluctuance tenderness or erythema, no perineal crepitus or tenderness ? ?ED Results / Procedures / Treatments  ?Labs ?(all labs ordered are listed, but only abnormal results are displayed) ?Labs Reviewed - No data to display ? ? ?EKG ? ? ? ? ?RADIOLOGY ? ? ? ?PROCEDURES: ? ?Critical Care performed: No ? ?Procedures ? ? ?MEDICATIONS ORDERED IN ED: ?Medications - No data to display ? ? ?IMPRESSION / MDM / ASSESSMENT AND PLAN / ED COURSE  ?I reviewed the triage vital signs and the nursing notes. ?             ?               ? ?Differential diagnosis includes, but is not limited to, abscess, hidradenitis suppurativa flare ? ?Patient is a 56 year old male who presents with concern for abscess on the left buttock and left groin region.  These have been a chronic issue  for him.  Received antibiotics several weeks ago.  Presents today with ongoing drainage but no real acute change.  He is afebrile and without systemic symptoms.  On exam the left buttock and left inguinal region look clinically like hidradenitis suppurativa.  He has never heard this term has not seen dermatology before.  He has several tunneling areas with purulent discharge but no fluctuance or evidence of cellulitis.  Exam not consistent with Fournier's gangrene.  We will treat with a course of doxycycline.  Referred to dermatology at [facility] as well as [facility] at [facility] which we will see patients without insurance.  Patient also requesting refill of his amlodipine. ? ?  ? ? ?FINAL CLINICAL IMPRESSION(S) / ED DIAGNOSES  ? ?Final diagnoses:  ?Hydradenitis  ? ? ? ?Rx / DC Orders  ? ?ED Discharge Orders   ? ?      Ordered  ?  amLODipine (NORVASC) 5 MG tablet  Daily       ? 02/21/22 1349  ?  doxycycline (MONODOX) 100 MG capsule  2 times daily       ? 02/21/22 1349  ? ?  ?  ? ?  ? ? ? ?Note:  This document was prepared using Dragon voice recognition software and  may include unintentional dictation errors. ?  ?Georga Hacking, MD ?02/21/22 2012 ? ?

## 2022-03-08 ENCOUNTER — Other Ambulatory Visit: Payer: Self-pay

## 2022-03-08 ENCOUNTER — Emergency Department: Payer: Medicaid Other

## 2022-03-08 ENCOUNTER — Inpatient Hospital Stay
Admission: EM | Admit: 2022-03-08 | Discharge: 2022-03-11 | DRG: 065 | Disposition: A | Discharge status: Home Health | Payer: Medicaid Other | Attending: Hospitalist | Admitting: Hospitalist

## 2022-03-08 ENCOUNTER — Inpatient Hospital Stay: Payer: Medicaid Other

## 2022-03-08 DIAGNOSIS — I10 Essential (primary) hypertension: Secondary | ICD-10-CM | POA: Diagnosis present

## 2022-03-08 DIAGNOSIS — R29701 NIHSS score 1: Secondary | ICD-10-CM | POA: Diagnosis present

## 2022-03-08 DIAGNOSIS — E785 Hyperlipidemia, unspecified: Secondary | ICD-10-CM | POA: Diagnosis present

## 2022-03-08 DIAGNOSIS — G8191 Hemiplegia, unspecified affecting right dominant side: Secondary | ICD-10-CM | POA: Diagnosis present

## 2022-03-08 DIAGNOSIS — I6349 Cerebral infarction due to embolism of other cerebral artery: Secondary | ICD-10-CM | POA: Diagnosis present

## 2022-03-08 DIAGNOSIS — F17210 Nicotine dependence, cigarettes, uncomplicated: Secondary | ICD-10-CM | POA: Diagnosis present

## 2022-03-08 DIAGNOSIS — I700 Atherosclerosis of aorta: Secondary | ICD-10-CM | POA: Diagnosis present

## 2022-03-08 DIAGNOSIS — R471 Dysarthria and anarthria: Secondary | ICD-10-CM | POA: Diagnosis present

## 2022-03-08 DIAGNOSIS — J449 Chronic obstructive pulmonary disease, unspecified: Secondary | ICD-10-CM | POA: Diagnosis present

## 2022-03-08 DIAGNOSIS — R29810 Facial weakness: Secondary | ICD-10-CM | POA: Diagnosis present

## 2022-03-08 DIAGNOSIS — Q2112 Patent foramen ovale: Secondary | ICD-10-CM | POA: Diagnosis not present

## 2022-03-08 DIAGNOSIS — Z823 Family history of stroke: Secondary | ICD-10-CM

## 2022-03-08 DIAGNOSIS — Z8673 Personal history of transient ischemic attack (TIA), and cerebral infarction without residual deficits: Secondary | ICD-10-CM | POA: Diagnosis present

## 2022-03-08 DIAGNOSIS — R4701 Aphasia: Secondary | ICD-10-CM | POA: Diagnosis present

## 2022-03-08 DIAGNOSIS — I639 Cerebral infarction, unspecified: Secondary | ICD-10-CM | POA: Diagnosis present

## 2022-03-08 LAB — COMPREHENSIVE METABOLIC PANEL
ALT: 10 U/L (ref 0–44)
AST: 16 U/L (ref 15–41)
Albumin: 3.4 g/dL — ABNORMAL LOW (ref 3.5–5.0)
Alkaline Phosphatase: 69 U/L (ref 38–126)
Anion gap: 11 (ref 5–15)
BUN: 12 mg/dL (ref 6–20)
CO2: 23 mmol/L (ref 22–32)
Calcium: 9.1 mg/dL (ref 8.9–10.3)
Chloride: 101 mmol/L (ref 98–111)
Creatinine, Ser: 1.13 mg/dL (ref 0.61–1.24)
GFR, Estimated: >60 mL/min (ref >60)
Glucose, Bld: 89 mg/dL (ref 70–99)
Potassium: 3.7 mmol/L (ref 3.5–5.1)
Sodium: 135 mmol/L (ref 135–145)
Total Bilirubin: 0.4 mg/dL (ref 0.3–1.2)
Total Protein: 8.4 g/dL — ABNORMAL HIGH (ref 6.5–8.1)

## 2022-03-08 LAB — CBC
HCT: 37.7 % — ABNORMAL LOW (ref 39.0–52.0)
Hemoglobin: 12.5 g/dL — ABNORMAL LOW (ref 13.0–17.0)
MCH: 30.9 pg (ref 26.0–34.0)
MCHC: 33.2 g/dL (ref 30.0–36.0)
MCV: 93.1 fL (ref 80.0–100.0)
Platelets: 351 10*3/uL (ref 150–400)
RBC: 4.05 MIL/uL — ABNORMAL LOW (ref 4.22–5.81)
RDW: 14 % (ref 11.5–15.5)
WBC: 5.6 10*3/uL (ref 4.0–10.5)
nRBC: 0 % (ref 0.0–0.2)

## 2022-03-08 LAB — APTT: aPTT: 35 seconds (ref 24–36)

## 2022-03-08 LAB — DIFFERENTIAL
Abs Immature Granulocytes: 0.05 10*3/uL (ref 0.00–0.07)
Basophils Absolute: 0.1 10*3/uL (ref 0.0–0.1)
Basophils Relative: 1 %
Eosinophils Absolute: 0.4 10*3/uL (ref 0.0–0.5)
Eosinophils Relative: 7 %
Immature Granulocytes: 1 %
Lymphocytes Relative: 35 %
Lymphs Abs: 1.9 10*3/uL (ref 0.7–4.0)
Monocytes Absolute: 0.6 10*3/uL (ref 0.1–1.0)
Monocytes Relative: 10 %
Neutro Abs: 2.6 10*3/uL (ref 1.7–7.7)
Neutrophils Relative %: 46 %

## 2022-03-08 LAB — PROTIME-INR
INR: 1.2 (ref 0.8–1.2)
Prothrombin Time: 14.7 seconds (ref 11.4–15.2)

## 2022-03-08 LAB — CBG MONITORING, ED: Glucose-Capillary: 90 mg/dL (ref 70–99)

## 2022-03-08 IMAGING — MR MR HEAD W/O CM
12 series · 42 of 48 positions shown · non-contrast
Comparison: Prior CT and CTA from earlier the same day.

CLINICAL DATA: Initial evaluation for neuro deficit, stroke
suspected.

EXAM:
MRI HEAD WITHOUT CONTRAST
TECHNIQUE: Multiplanar, multiecho pulse sequences of the brain and surrounding
structures were obtained without intravenous contrast.

[Series 5: ax dwi_tracew · axial · 3.0mm · 0.65mm/px · z∈[-99,+53]mm · 3 of 48 slices shown]
[im 1/48]
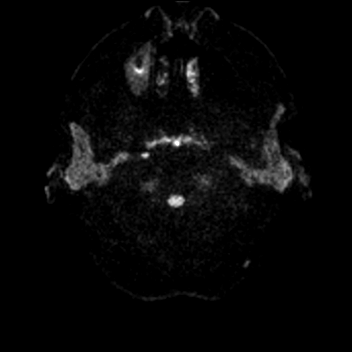
[im 24/48]
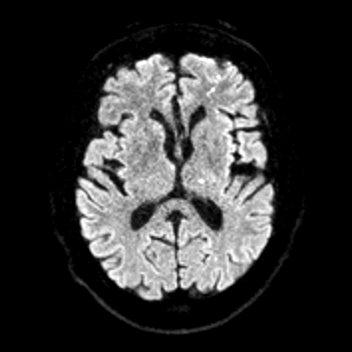
[im 48/48]
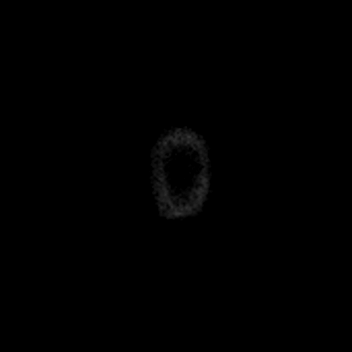

[Series 6: ax dwi_adc · axial · 3.0mm · 0.65mm/px · z∈[-99,+53]mm · 3 of 48 slices shown]
[im 1/48]
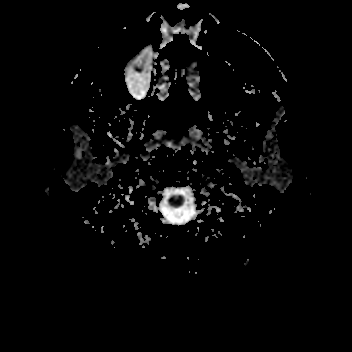
[im 24/48]
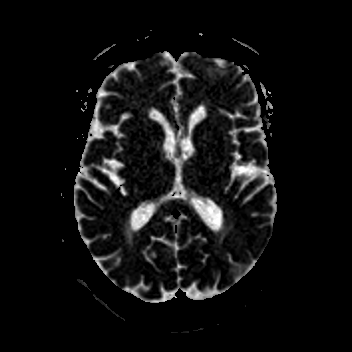
[im 48/48]
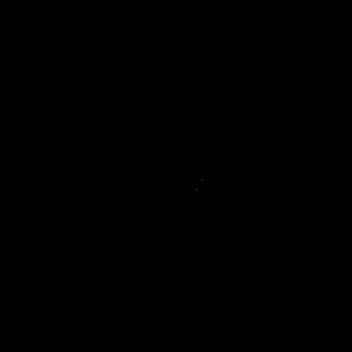

[Series 7: cor dwi_tracew · coronal · 5.0mm · 0.60mm/px · 3 of 38 slices shown]
[im 1/38]
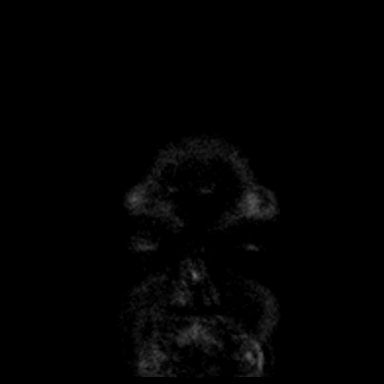
[im 19/38]
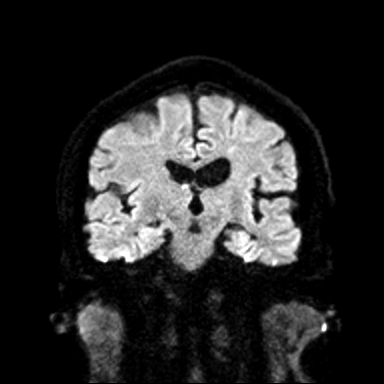
[im 38/38]
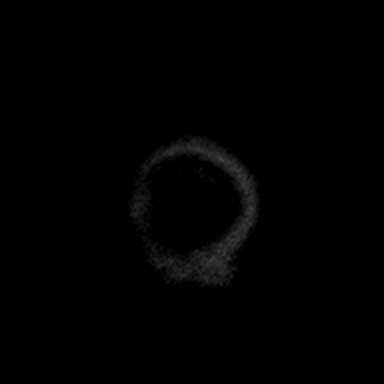

[Series 8: cor dwi_adc · coronal · 5.0mm · 0.60mm/px · 3 of 38 slices shown]
[im 1/38]
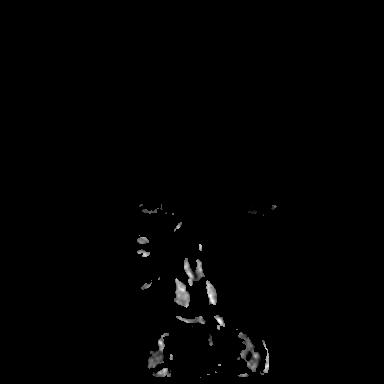
[im 19/38]
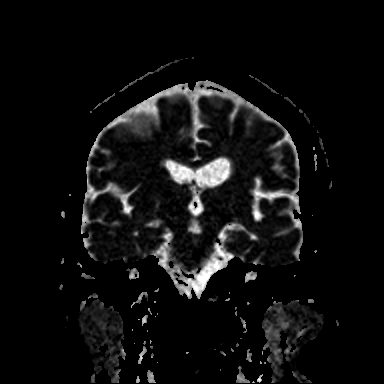
[im 38/38]
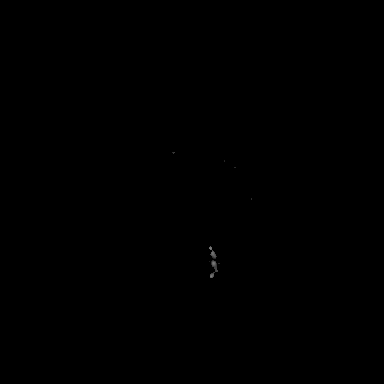

[Series 9: T1 · sagittal · 5.0mm · 0.62mm/px · 2 of 22 slices shown (1 of 2)]
[im 1/22]
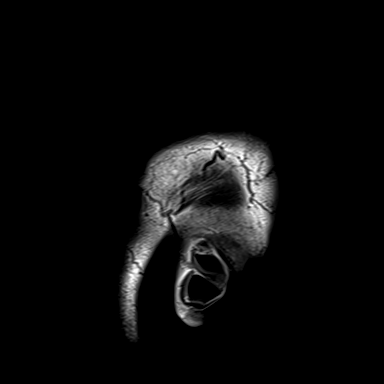
[im 22/22]
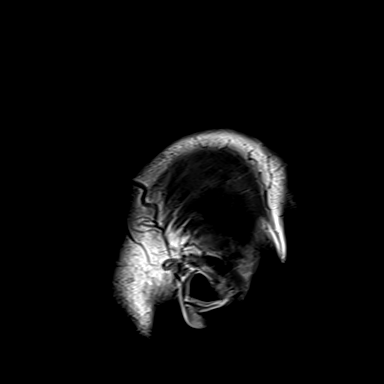

[Series 10: T2 · axial · 5.0mm · 0.53mm/px · z∈[-96,+51]mm · 2 of 26 slices shown (1 of 2)]
[im 1/26]
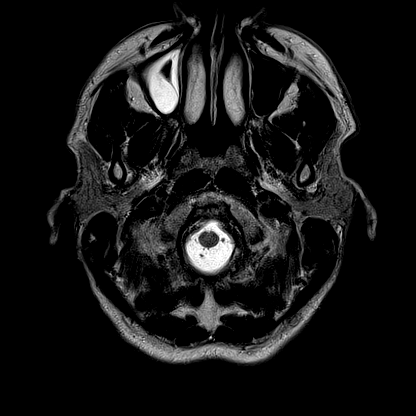
[im 26/26]
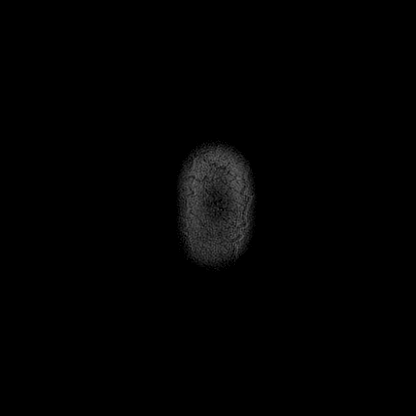

[Series 11: ax swi_mag · axial · 2.0mm · 0.90mm/px · z∈[-101,+54]mm · 5 of 80 slices shown]
[im 1/80]
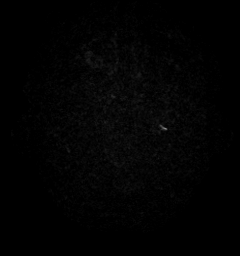
[im 20/80]
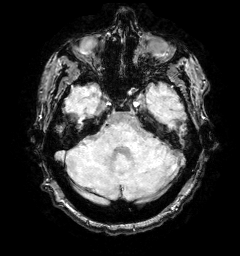
[im 40/80]
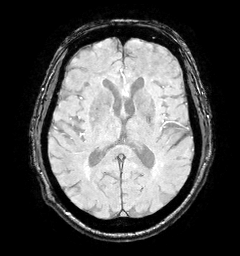
[im 60/80]
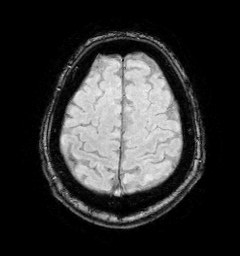
[im 80/80]
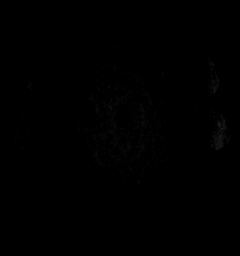

[Series 12: ax swi_pha · axial · 2.0mm · 0.90mm/px · z∈[-101,+54]mm · 5 of 80 slices shown]
[im 1/80]
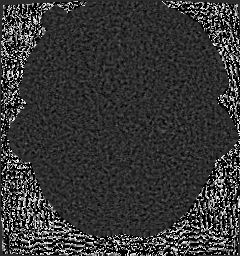
[im 20/80]
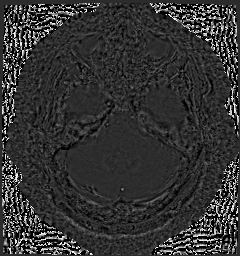
[im 40/80]
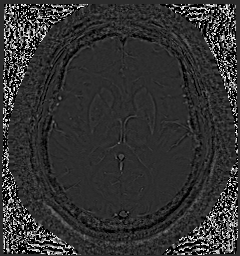
[im 60/80]
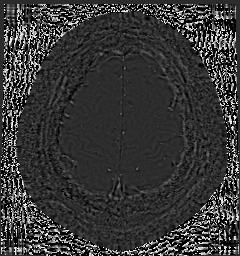
[im 80/80]
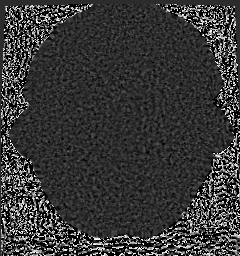

[Series 13: ax swi_swi · axial · 2.0mm · 0.90mm/px · z∈[-101,-64]mm · 2 of 80 slices shown]
[im 1/80]
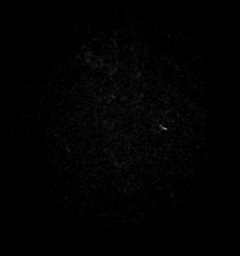
[im 20/80]
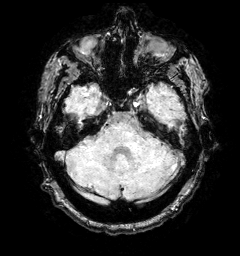

[Series 15: FLAIR · axial · 3.0mm · 0.53mm/px · z∈[-97,+53]mm · 4 of 52 slices shown]
[im 1/52]
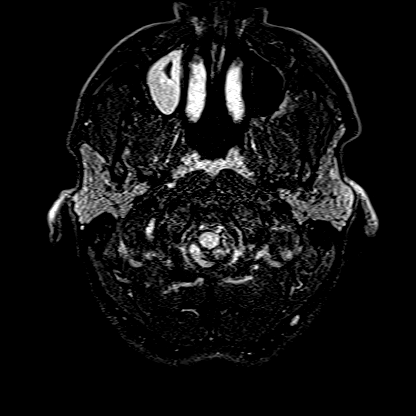
[im 18/52]
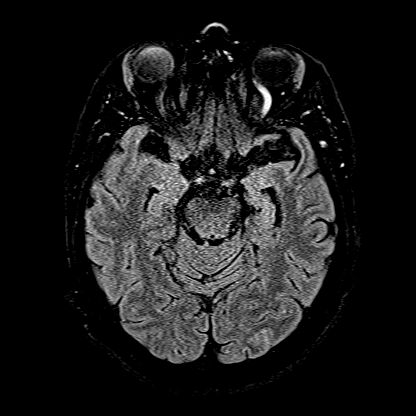
[im 35/52]
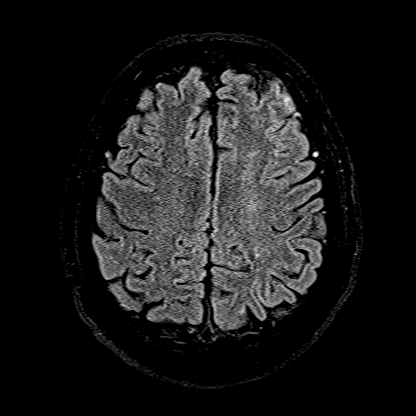
[im 52/52]
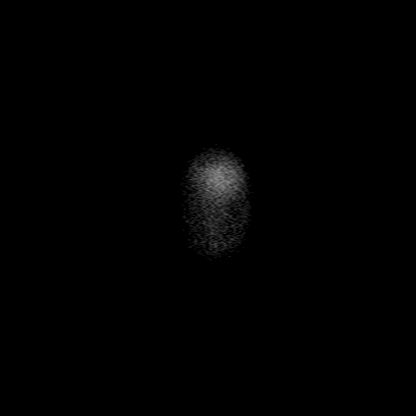

[Series 16: T1 · axial · 1.0mm · 0.98mm/px · z∈[-103,+53]mm · 8 of 160 slices shown (2 of 2)]
[im 1/160]
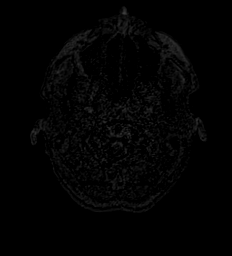
[im 32/160]
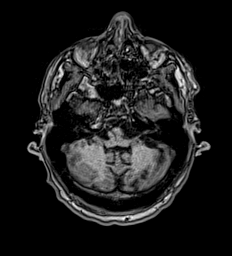
[im 48/160]
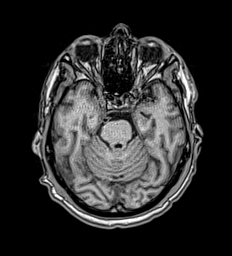
[im 64/160]
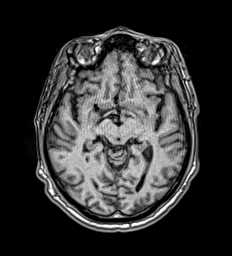
[im 96/160]
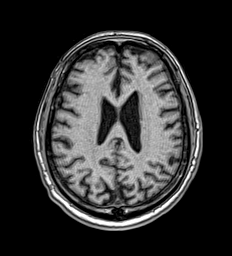
[im 112/160]
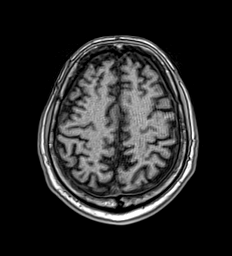
[im 128/160]
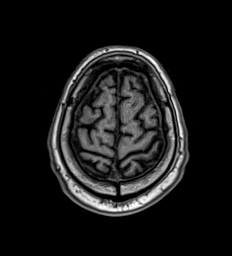
[im 160/160]
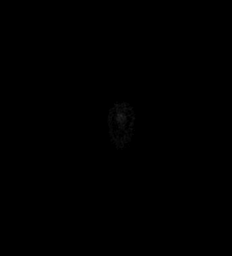

[Series 17: T2 · coronal · 5.0mm · 0.57mm/px · 2 of 28 slices shown (2 of 2)]
[im 1/28]
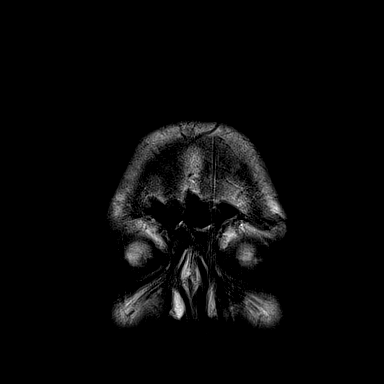
[im 28/28]
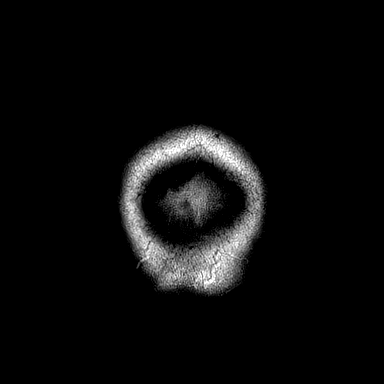

[42 of 48 positions shown; findings below may reference images not displayed]

FINDINGS: Brain: Cerebral volume within normal limits for age. Patchy T2/FLAIR
hyperintensity involving the periventricular and deep white matter
both cerebral hemispheres, most consistent with chronic small vessel
ischemic disease, mild to moderate in nature. Few scatter remote
bilateral cerebellar infarcts, left greater than right. Remote
lacunar infarct present at the left basal ganglia.

Patchy multifocal acute ischemic infarcts seen involving the
cortical and subcortical left frontal, parietal, and occipital
lobes, somewhat watershed in distribution. Involvement is greatest
at the anterior left frontal lobe (series 5, image 28). Additional 8
mm acute ischemic infarcts seen involving the central left thalamus
(series 5, image 23). Small 1.1 cm linear acute ischemic infarct
present at the right cerebellum (series 5, image 11). Probable
subtle punctate ischemic changes at the ventral right thalamus
(series 5, images 25, 24). No associated hemorrhage or mass effect.
A central thromboembolic etiology is suspected given the various
vascular distributions involved.

Otherwise, gray-white matter differentiation maintained. No other
areas of chronic cortical infarction. No acute or chronic
intracranial blood products. No mass lesion or midline shift. No
hydrocephalus or extra-axial fluid collection. Pituitary gland
suprasellar region normal. Midline structures intact.

Vascular: Major intracranial vascular flow voids are maintained.

Skull and upper cervical spine: Basal junction within normal limits.
Bone marrow signal intensity normal. No scalp soft tissue
abnormality.

Sinuses/Orbits: Globes and orbital soft tissues demonstrate no acute
finding. Moderate mucosal thickening present about the right
maxillary sinus. Scattered mucosal thickening noted about the
ethmoidal air cells as well. Small retention cyst noted within the
left sphenoid sinus. No significant mastoid effusion.

Other: None.
IMPRESSION: 1. Patchy multifocal acute ischemic infarcts involving the cortical
and subcortical left frontal, parietal, and occipital lobes, within
additional ischemic infarcts involving the left greater than right
thalami and right cerebellum. No associated hemorrhage or mass
effect. A central thromboembolic etiology is suspected given the
various vascular distributions involved.
2. Underlying age-related cerebral atrophy with mild to moderate
chronic small vessel ischemic disease, with a few scattered remote
bilateral cerebellar infarcts, left greater than right, with
additional remote lacunar infarct at the left basal ganglia.

## 2022-03-08 IMAGING — CT CT HEAD W/O CM
4 series · 16 of 47 positions shown, 18 images · non-contrast
Comparison: [DATE]

CLINICAL DATA: Neuro deficit, acute, stroke suspected. Right arm
weakness, dysarthria



[Series 2: head wo · axial · 0.45mm/px · z∈[-116,+4]mm · 7 of 34 slices shown, 9 images]
[im 5/34  brain]
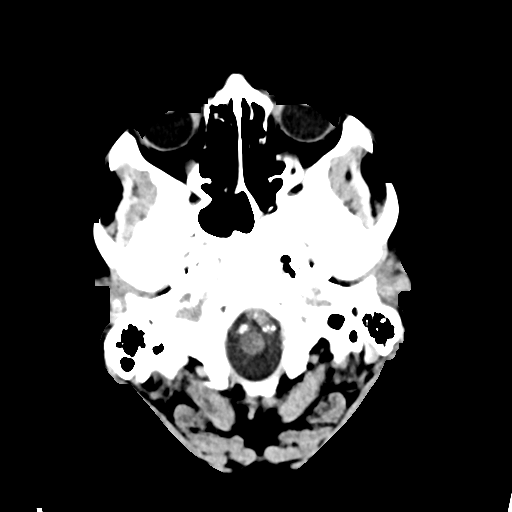
[im 5/34  bone]
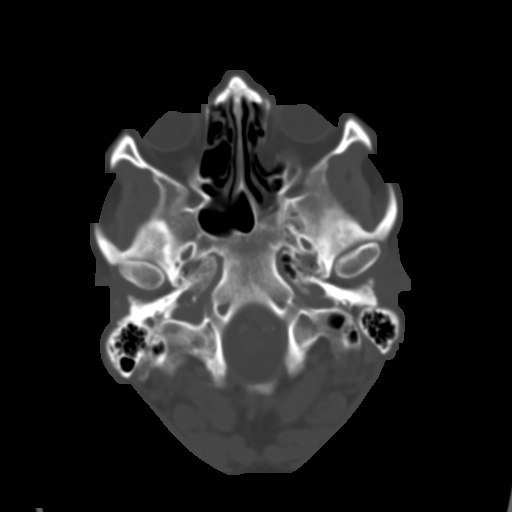
[im 9/34  brain]
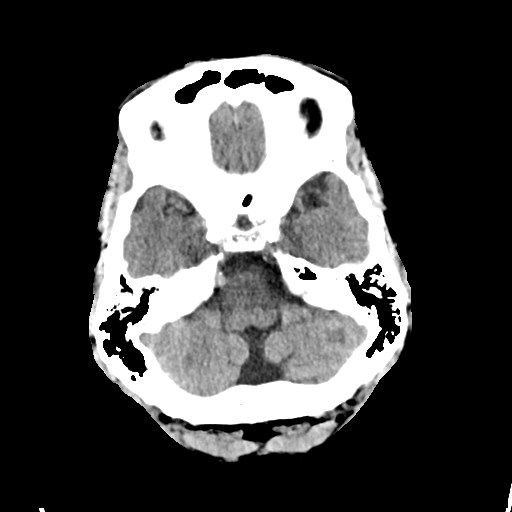
[im 13/34  brain]
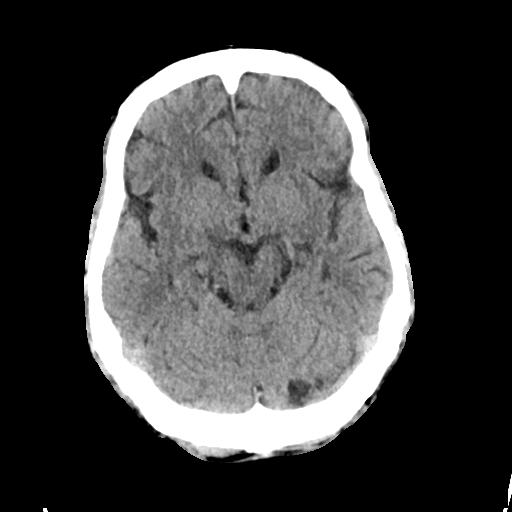
[im 17/34  brain]
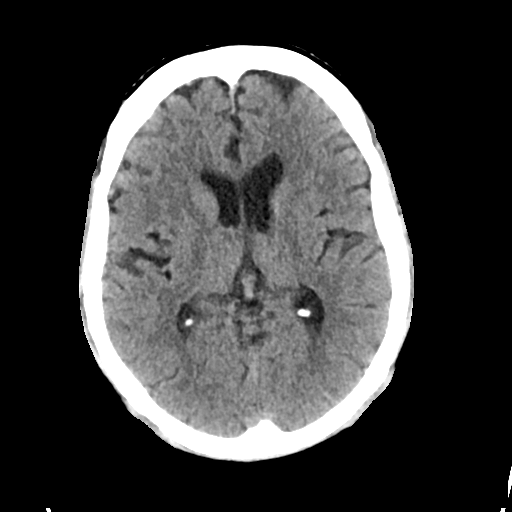
[im 21/34  brain]
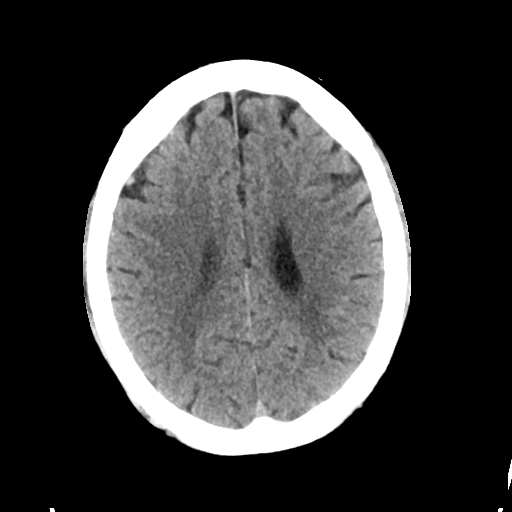
[im 21/34  bone]
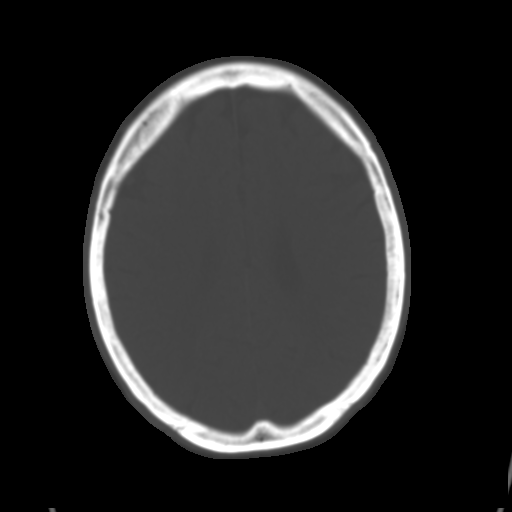
[im 25/34  brain]
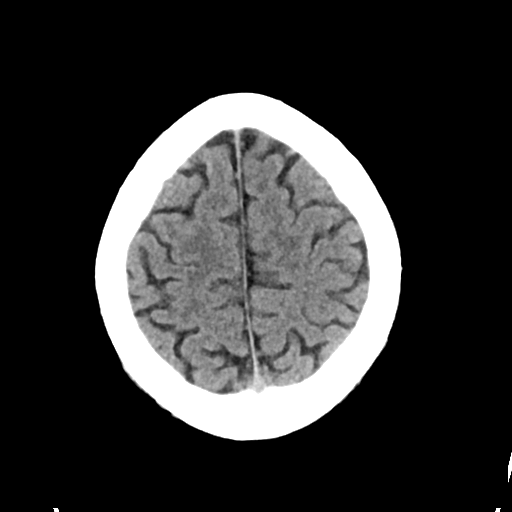
[im 29/34  brain]
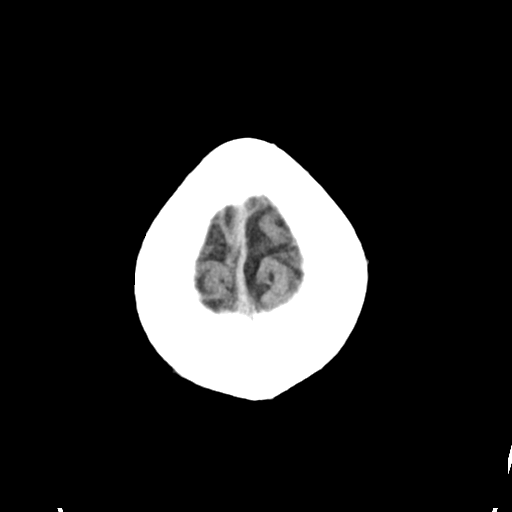

[Series 3: head bone · axial · 0.45mm/px · z∈[-120,-86]mm · 3 of 85 slices shown]
[im 9/85  bone]
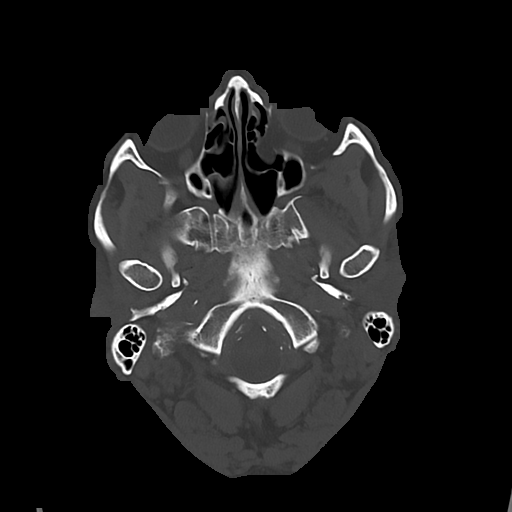
[im 17/85  bone]
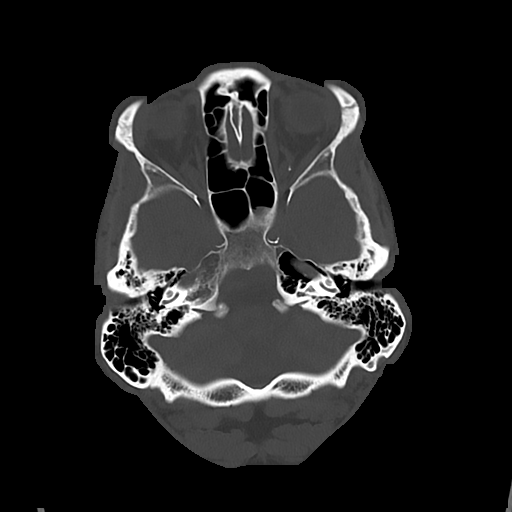
[im 26/85  bone]
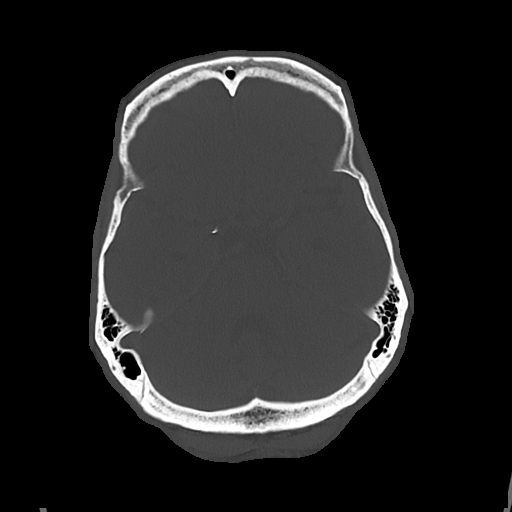

[Series 4: cor soft · coronal · 0.38mm/px · 3 of 72 slices shown]
[im 24/72  brain]
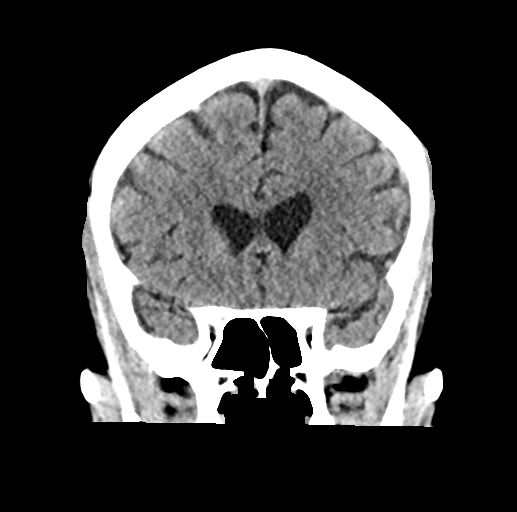
[im 32/72  brain]
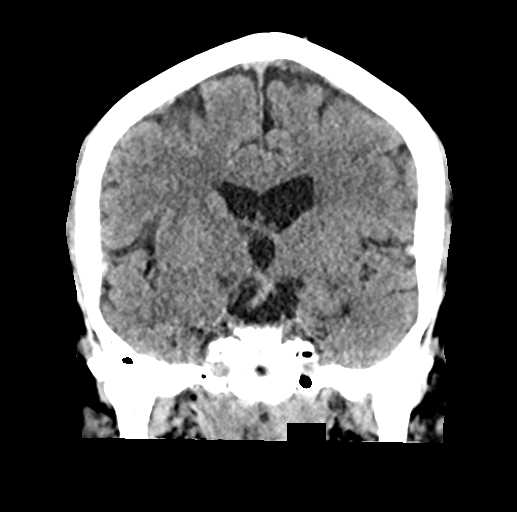
[im 40/72  brain]
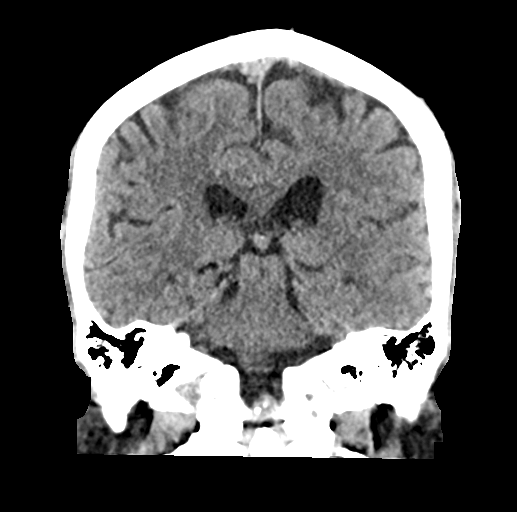

[Series 5: sag soft · sagittal · 0.38mm/px · 3 of 65 slices shown]
[im 22/65  brain]
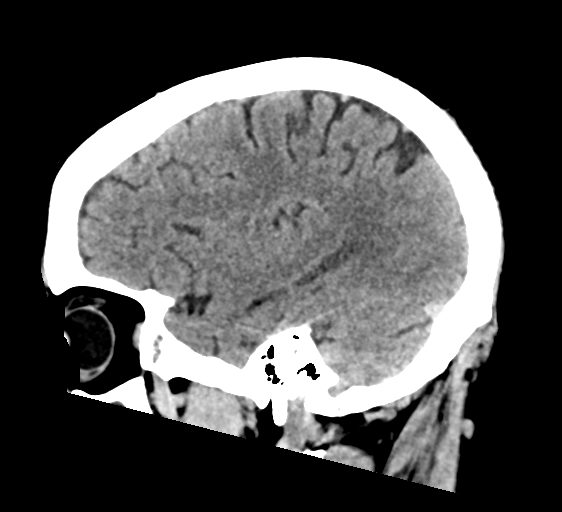
[im 33/65  brain]
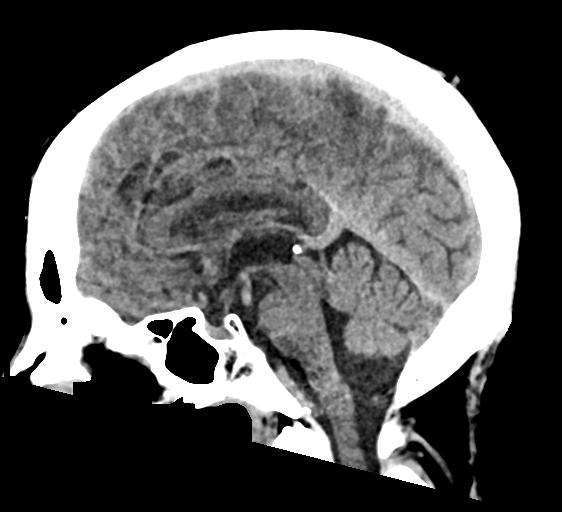
[im 43/65  brain]
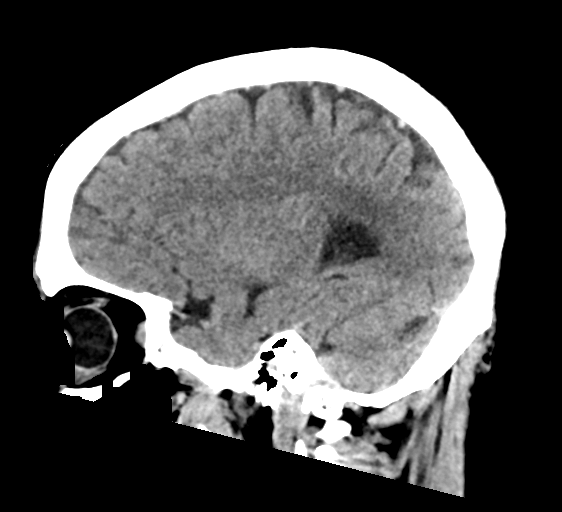

[16 of 47 positions shown; findings below may reference images not displayed]

FINDINGS: Brain: Subacute to remote left thalamic infarct has developed in the
interval since prior examination. Remote left cerebellar infarct
again noted. No acute intracranial hemorrhage. No abnormal mass
effect or midline shift. No abnormal intra or extra-axial mass
lesion. Ventricular size is normal.

Vascular: No asymmetric hyperdense vasculature at the skull base.
Extensive atherosclerotic calcification noted within the carotid
siphons.

Skull: Normal. Negative for fracture or focal lesion.

Sinuses/Orbits: Remote left medial orbital wall fracture. Orbits are
otherwise unremarkable. Moderate mucosal thickening within the
visualized right maxillary sinus with small layering fluid. Small
amount of layering mucus within the left sphenoid sinus. Remaining
paranasal sinuses are clear.

Other: Mastoid air cells and middle ear cavities are clear.
IMPRESSION: Subacute to remote left thalamic infarct, new since prior
examination of [DATE]. MRI examination may be more helpful to
better age the lesion.

Remote left cerebellar infarct.

Moderate right maxillary sinus disease.

## 2022-03-08 IMAGING — CT CT ANGIO HEAD-NECK (W OR W/O PERF)
2 of 7 series · 8 of 33 positions shown · IV contrast (APPLIED)
Comparison: Prior CT and MRI from earlier the same day.

CLINICAL DATA: Initial evaluation for acute stroke.

EXAM:
CT ANGIOGRAPHY HEAD AND NECK
TECHNIQUE: Multidetector CT imaging of the head and neck was performed using
the standard protocol during bolus administration of intravenous
contrast. Multiplanar CT image reconstructions and MIPs were
obtained to evaluate the vascular anatomy. Carotid stenosis
measurements (when applicable) are obtained utilizing NASCET
criteria, using the distal internal carotid diameter as the
denominator.

[Series 5: cta neck/head · axial · 0.44mm/px · z∈[-273,-137]mm · 2 of 205 slices shown]
[im 69/205  soft-tissue]
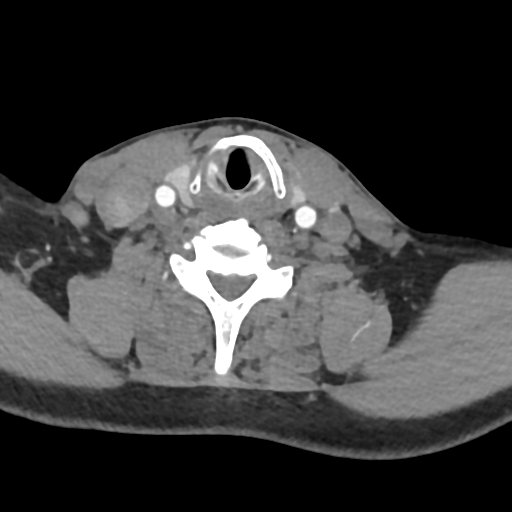
[im 137/205  soft-tissue]
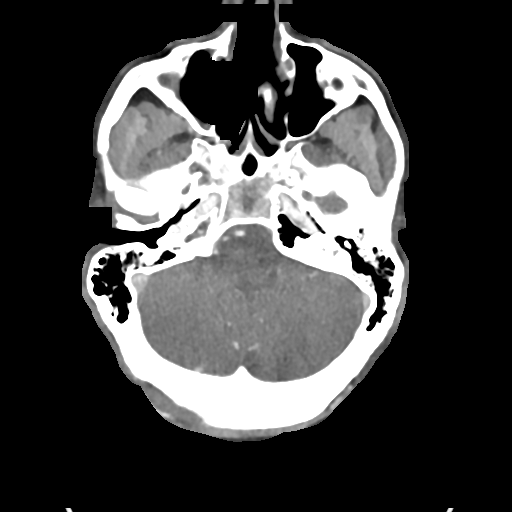

[Series 7: ax thins · axial · 0.39mm/px · z∈[-346,-58]mm · 6 of 404 slices shown]
[im 58/404  soft-tissue]
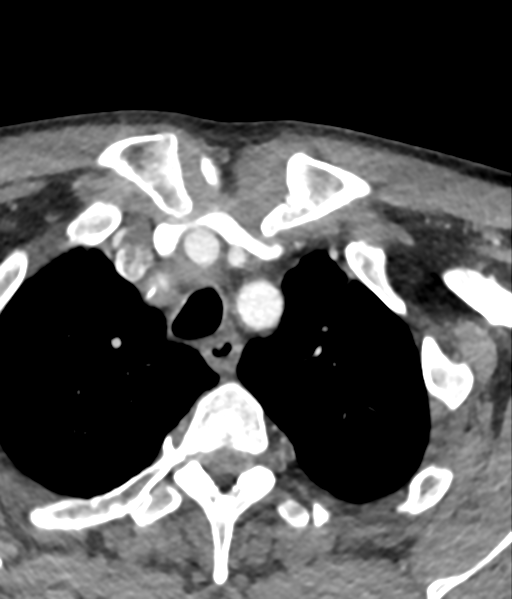
[im 116/404  bone]
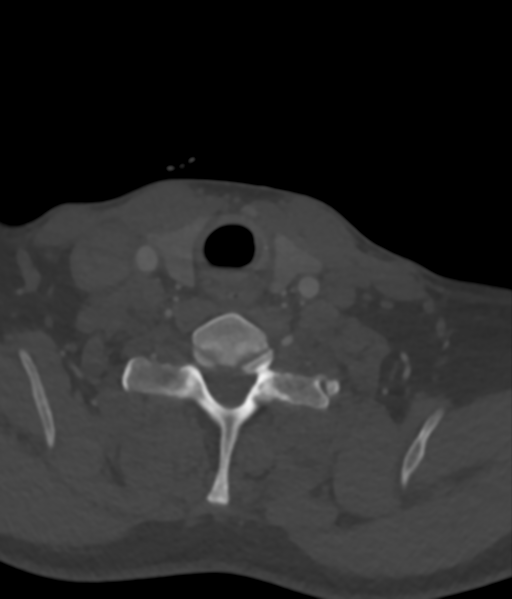
[im 173/404  soft-tissue]
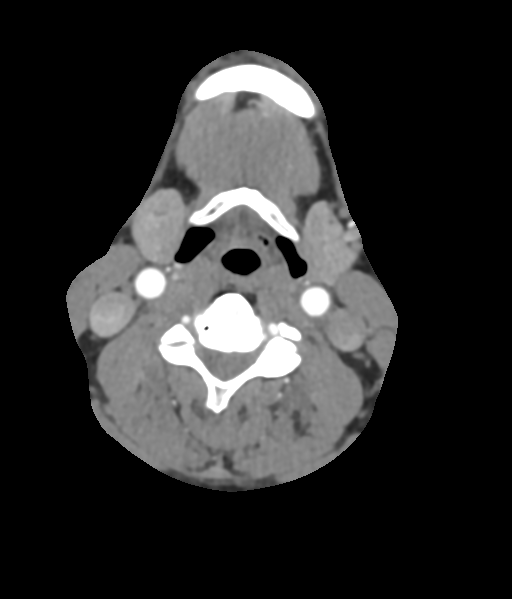
[im 231/404  bone]
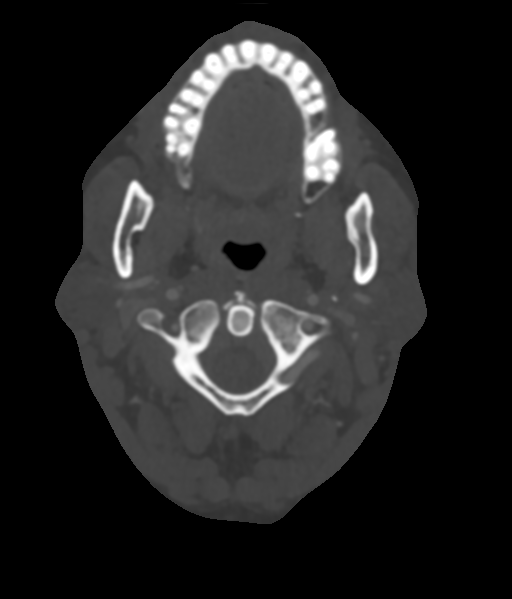
[im 288/404  soft-tissue]
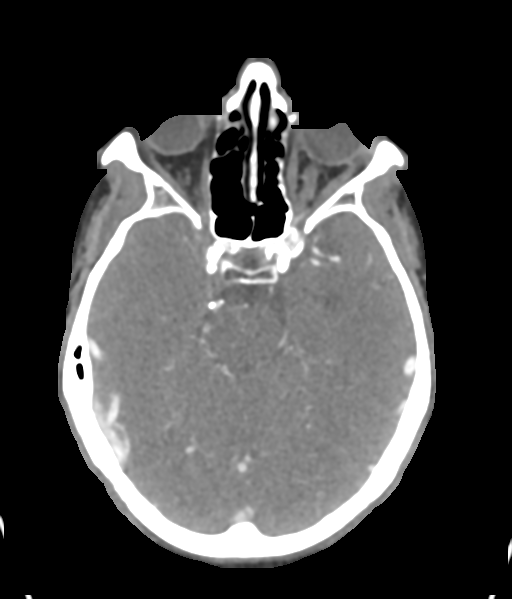
[im 346/404  bone]
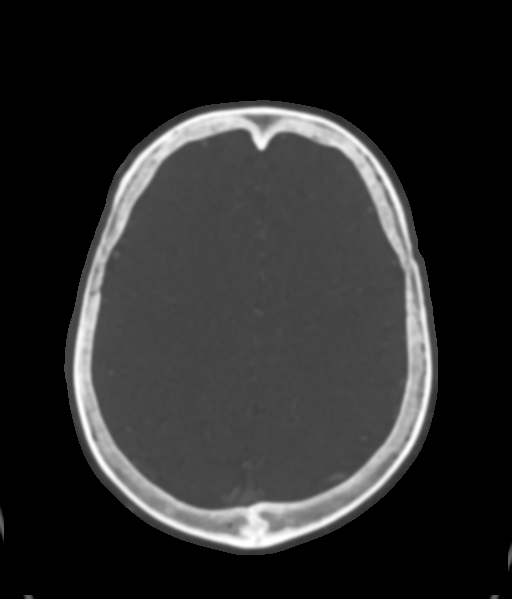

[8 of 33 positions shown; findings below may reference images not displayed]

RADIATION DOSE REDUCTION: This exam was performed according to the
departmental dose-optimization program which includes automated
exposure control, adjustment of the mA and/or kV according to
patient size and/or use of iterative reconstruction technique.

CONTRAST:  75mL OMNIPAQUE IOHEXOL 350 MG/ML SOLN
FINDINGS: CTA NECK FINDINGS

Aortic arch: Visualized aortic arch normal caliber with normal
branch pattern. Mild atheromatous change about the arch itself. No
hemodynamically significant stenosis about the origin the great
vessels.

Right carotid system: Right CCA patent from its origin to the
bifurcation without stenosis. Bulky calcified plaque about the right
carotid bulb/proximal right ICA with associated stenosis of up to
approximate 50% by NASCET criteria. Right ICA patent distally
without stenosis or dissection.

Left carotid system: Left CCA patent from its origin to the
bifurcation without stenosis. Scattered calcified plaque about the
left carotid bulb/proximal left ICA with no more than mild 30%
stenosis by NASCET criteria. Left ICA patent distally without
stenosis or dissection.

Vertebral arteries: Both vertebral arteries arise from subclavian
arteries. No proximal subclavian artery stenosis. Vertebral arteries
patent without significant stenosis or dissection.

Skeleton: No discrete or worrisome osseous lesions. Mild cervical
spondylosis without significant spinal stenosis.

Other neck: No other acute soft tissue abnormality within the neck.

Upper chest: Visualized upper chest demonstrates no acute finding.

Review of the MIP images confirms the above findings

CTA HEAD FINDINGS

Anterior circulation: Petrous segments patent without significant
stenosis. Extensive atheromatous disease throughout the carotid
siphons with associated moderate to severe multifocal stenoses
bilaterally. A1 segments patent. Normal anterior communicating
artery complex. Anterior cerebral arteries patent without stenosis.
No significant M1 stenosis. No proximal MCA branch occlusion. Distal
MCA branches perfused and symmetric. Diffuse small vessel
atheromatous irregularity.

Posterior circulation: Extensive atheromatous change within the V4
segments bilaterally with associated moderate to severe stenoses on
the right, and more mild-to-moderate stenoses on the left. Severe
atheromatous disease seen throughout the basilar artery with
associated severe multifocal stenoses about its proximal and mid
aspect. Superior cerebellar arteries patent bilaterally. Both PCAs
primarily supplied via the basilar. Extensive atheromatous disease
seen throughout the left PCA with associated severe multifocal
stenoses. More mild-to-moderate atheromatous disease noted within
the right PCA. Both PCAs remain perfused to their distal aspects.

Venous sinuses: Patent allowing for timing the contrast bolus.

Anatomic variants: None significant.  No aneurysm.

Review of the MIP images confirms the above findings
IMPRESSION: 1. Negative CTA for large vessel occlusion or other emergent
finding.
2. Atheromatous plaque about the right greater than left carotid
bifurcations with associated stenosis of up to 50% on the right and
30% on the left.
3. Extensive age advanced intracranial atheromatous disease, most
pronounced about the posterior circulation where there are moderate
to severe multifocal stenoses involving the right V4 segment,
basilar artery, and left greater than right PCAs. Additional
moderate to severe stenoses involving both carotid siphons.

## 2022-03-08 MED ORDER — ACETAMINOPHEN 650 MG RE SUPP: 650.0000 mg | RECTAL | Status: DC | PRN

## 2022-03-08 MED ORDER — SENNOSIDES-DOCUSATE SODIUM 8.6-50 MG PO TABS: 1.0000 | ORAL_TABLET | Freq: Every evening | ORAL | Status: DC | PRN

## 2022-03-08 MED ORDER — SODIUM CHLORIDE 0.9 % IV SOLN: INTRAVENOUS | Status: DC

## 2022-03-08 MED ORDER — ASPIRIN EC 81 MG PO TBEC
81.0000 mg | DELAYED_RELEASE_TABLET | Freq: Every day | ORAL | Status: DC
  Administered 2022-03-09 – 2022-03-11 (×3): 81 mg via ORAL
  Filled 2022-03-08 (×3): qty 1

## 2022-03-08 MED ORDER — ATORVASTATIN CALCIUM 80 MG PO TABS
80.0000 mg | ORAL_TABLET | Freq: Every day | ORAL | Status: DC
  Administered 2022-03-09 – 2022-03-11 (×3): 80 mg via ORAL
  Filled 2022-03-08 (×3): qty 1

## 2022-03-08 MED ORDER — LABETALOL HCL 5 MG/ML IV SOLN: 10.0000 mg | INTRAVENOUS | Status: DC | PRN

## 2022-03-08 MED ORDER — SODIUM CHLORIDE 0.9% FLUSH: 3.0000 mL | Freq: Once | INTRAVENOUS | Status: DC

## 2022-03-08 MED ORDER — STROKE: EARLY STAGES OF RECOVERY BOOK: Freq: Once | Status: AC

## 2022-03-08 MED ORDER — CLOPIDOGREL BISULFATE 75 MG PO TABS
75.0000 mg | ORAL_TABLET | Freq: Every day | ORAL | Status: DC
Start: 2022-03-08 — End: 2022-03-11
  Administered 2022-03-09 – 2022-03-11 (×3): 75 mg via ORAL
  Filled 2022-03-08 (×3): qty 1

## 2022-03-08 MED ORDER — IOHEXOL 350 MG/ML SOLN
75.0000 mL | Freq: Once | INTRAVENOUS | Status: AC | PRN
  Administered 2022-03-08: 75 mL via INTRAVENOUS

## 2022-03-08 MED ORDER — ACETAMINOPHEN 160 MG/5ML PO SOLN
650.0000 mg | ORAL | Status: DC | PRN
  Filled 2022-03-08: qty 20.3

## 2022-03-08 MED ORDER — ACETAMINOPHEN 325 MG PO TABS: 650.0000 mg | ORAL_TABLET | ORAL | Status: DC | PRN

## 2022-03-08 MED ORDER — ASPIRIN 81 MG PO CHEW
324.0000 mg | CHEWABLE_TABLET | Freq: Once | ORAL | Status: AC
  Administered 2022-03-08: 324 mg via ORAL
  Filled 2022-03-08: qty 4

## 2022-03-08 NOTE — ED Notes (Signed)
Pt moved from room to hallway for emergent situation by request of charge RN. Pt assisted to toilet at this time.  ?

## 2022-03-08 NOTE — ED Notes (Signed)
Maisie Fus, MD notified of BP of 155/123. PRN order placed by MD. Inpatient nurse updated.  ?

## 2022-03-08 NOTE — ED Notes (Signed)
Pharmacy with pt ?

## 2022-03-08 NOTE — H&P (Signed)
?History and Physical  ? ? James Lambert?James Lambert ION:[id number] DOB: [birth year] DOA: 03/08/2022 ? ?PCP: Pcp, No  ?Patient coming from: home ? ?I have personally briefly [facility] ? ?Chief Complaint:  right arm weakness and slurred speech ? ?HPI: James BalloonJerry M Lambert is a 56 y.o. male with medical history  uncontrolled HTN, asthma, tobacco abuse who presents with chief complaint of right arm weakness and trouble "getting his words out" yesterday at 6:30 AM. Per patient he started to notice weakness in his RUE around 3 days  ago. He became concerned when weakness progressed and on the day of presentation he was started to experience speech difficulties. That he describes as inability to speak clearly. Patient denies any associated headache, vision changes, difficulty swallowing, numbness or weakness of any other extremities. He also denies chest , shortness of breath or cough. He does endorse continued etoh use as  well as tobacco use. ? ?ED Course:  ?Vitals  ?Afeb, bp 159/112, hr 78 , rr 24 sat 100%  ? ?Exam: noted right side arm weakness and expressive aphasia  ?Ct head noted subacute on remote left thalamic infarct  ? 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 MRI examination may be more helpful to ?better age the lesion. ?  ?Remote left cerebellar infarct. ?  ?Moderate right maxillary sinus disease. ? ? ?Labs:  ?Wbc 5.6, hgb 12.5, plt 351 ?Na 135, K 3.7, cr 1.13 ? ?EKG: sinus, no acute st-twave  changes  ? ?Review of Systems: As per HPI otherwise 10 point review of systems negative.  ? ?Past Medical History:  ?Diagnosis Date  ? Asthma   ? COPD (chronic obstructive pulmonary disease) (HCC)   ? Hidradenitis suppurativa   ? [facility] based on history and physical exam  ? Hypertension   ? ? ?History reviewed. No pertinent surgical history. ? ? reports that he has been smoking cigarettes. He has been smoking an average of  .5 packs per day. He has never used smokeless tobacco. He reports current alcohol use. He reports current drug use. Drug: Marijuana. ? ?No Known Allergies ? ?FH ?CVA mother , father  ? ?Prior to Admission medications   ?Medication Sig Start Date End Date Taking? Authorizing Provider  ?albuterol (PROAIR HFA) 108 (90 Base) MCG/ACT inhaler Inhale 2 puffs into the lungs once every 6 (six) hours as needed for wheezing or shortness of breath. 01/01/22   Gilles ChiquitoSmith, Zachary P, MD  ?amLODipine (NORVASC) 5 MG tablet Take 1 tablet (5 mg total) by mouth once daily. 02/21/22 05/22/22  Georga HackingMcHugh, Kelly Rose, MD  ? ? ?Physical Exam: ?Vitals:  ? 03/08/22 1956 03/08/22 2000 03/08/22 2002 03/08/22 2045  ?BP:      ?Pulse:  73  82  ?Resp:  18  19  ?Temp: 98.1 ?F (36.7 ?C)     ?TempSrc: Oral     ?SpO2:  96%  96%  ?Weight:   113.4 kg   ?Height:   6\' 3"  (1.905 m)   ? ? ? ?Vitals:  ? 03/08/22 1956 03/08/22 2000 03/08/22 2002 03/08/22 2045  ?BP:      ?Pulse:  73  82  ?Resp:  18  19  ?Temp: 98.1 ?F (36.7 ?C)     ?TempSrc: Oral     ?SpO2:  96%  96%  ?Weight:   113.4 kg   ?Height:   6\' 3"  (1.905 m)   ?Constitutional: NAD,  calm, comfortable ?Eyes: PERRL, lids and conjunctivae normal ?ENMT: Mucous membranes are moist. Posterior pharynx clear of any exudate or lesions.Normal dentition.  ?Neck: normal, supple, no masses, no thyromegaly ?Respiratory: clear to auscultation bilaterally, no wheezing, no crackles. Normal respiratory effort. No accessory muscle use.  ?Cardiovascular: Regular rate and rhythm, no murmurs / rubs / gallops. No extremity edema. 2+ pedal pulses. No carotid bruits.  ?Abdomen: no tenderness, no masses palpated. No hepatosplenomegaly. Bowel sounds positive.  ?Musculoskeletal: no clubbing / cyanosis. No joint deformity upper and lower extremities. Good ROM, no contractures. Normal muscle tone.  ?Skin: no rashes, lesions, ulcers. No induration ?Neurologic: CN 2-12 grossly intact. Sensation intact,  Strength 5/5 in all ext ( exam back to  normal from 1st assessment) ?Psychiatric: Normal judgment and insight. Alert and oriented x 3. Normal mood.  ? ? ?Labs on Admission: I have personally reviewed following labs and imaging studies ? ?CBC: ?Recent Labs  ?Lab 03/08/22 ?1949  ?WBC 5.6  ?NEUTROABS 2.6  ?HGB 12.5*  ?HCT 37.7*  ?MCV 93.1  ?PLT 351  ? ?Basic Metabolic Panel: ?Recent Labs  ?Lab 03/08/22 ?1949  ?NA 135  ?K 3.7  ?CL 101  ?CO2 23  ?GLUCOSE 89  ?BUN 12  ?CREATININE 1.13  ?CALCIUM 9.1  ? ?GFR: ?Estimated Creatinine Clearance: 101.6 mL/min (by C-G formula based on SCr of 1.13 mg/dL). ?Liver Function Tests: ?Recent Labs  ?Lab 03/08/22 ?1949  ?AST 16  ?ALT 10  ?ALKPHOS 69  ?BILITOT 0.4  ?PROT 8.4*  ?ALBUMIN 3.4*  ? ?No results for input(s): LIPASE, AMYLASE in the last 168 hours. ?No results for input(s): AMMONIA in the last 168 hours. ?Coagulation Profile: ?Recent Labs  ?Lab 03/08/22 ?1949  ?INR 1.2  ? ?Cardiac Enzymes: ?No results for input(s): CKTOTAL, CKMB, CKMBINDEX, TROPONINI in the last 168 hours. ?BNP (last 3 results) ?No results for input(s): PROBNP in the last 8760 hours. ?HbA1C: ?No results for input(s): HGBA1C in the last 72 hours. ?CBG: ?Recent Labs  ?Lab 03/08/22 ?2020  ?GLUCAP 90  ? ?Lipid Profile: ?No results for input(s): CHOL, HDL, LDLCALC, TRIG, CHOLHDL, LDLDIRECT in the last 72 hours. ?Thyroid Function Tests: ?No results for input(s): TSH, T4TOTAL, FREET4, T3FREE, THYROIDAB in the last 72 hours. ?Anemia Panel: ?No results for input(s): VITAMINB12, FOLATE, FERRITIN, TIBC, IRON, RETICCTPCT in the last 72 hours. ?Urine analysis: ?   ?Component Value Date/Time  ? COLORURINE YELLOW (A) 12/21/2020 1804  ? APPEARANCEUR HAZY (A) 12/21/2020 1804  ? LABSPEC 1.009 12/21/2020 1804  ? PHURINE 5.0 12/21/2020 1804  ? GLUCOSEU NEGATIVE 12/21/2020 1804  ? HGBUR NEGATIVE 12/21/2020 1804  ? BILIRUBINUR NEGATIVE 12/21/2020 1804  ? KETONESUR NEGATIVE 12/21/2020 1804  ? PROTEINUR NEGATIVE 12/21/2020 1804  ? NITRITE NEGATIVE 12/21/2020 1804  ?  LEUKOCYTESUR NEGATIVE 12/21/2020 1804  ? ? ?Radiological Exams on Admission: ?CT HEAD WO CONTRAST ? ?Result Date: 03/08/2022 ?CLINICAL DATA:  Neuro deficit, acute, stroke suspected. Right arm weakness, dysarthria EXAM: CT HEAD WITHOUT CONTRAST TECHNIQUE: Contiguous axial images were obtained from the base of the skull through the vertex without intravenous contrast. RADIATION DOSE REDUCTION: This exam was performed according to the departmental dose-optimization program which includes automated exposure control, adjustment of the mA and/or kV according to patient size and/or use of iterative reconstruction technique. 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 Remote left cerebellar infarct again noted. No acute intracranial hemorrhage. No abnormal mass effect or midline shift. No abnormal intra  or extra-axial mass lesion. Ventricular size is normal. Vascular: No asymmetric hyperdense vasculature at the skull base. Extensive atherosclerotic calcification noted within the carotid siphons. Skull: Normal. Negative for fracture or focal lesion. Sinuses/Orbits: Remote left medial orbital wall fracture. Orbits are otherwise unremarkable. Moderate mucosal thickening within the visualized right maxillary sinus with small layering fluid. Small amount of layering mucus within the left sphenoid sinus. Remaining paranasal sinuses are clear. Other: Mastoid air cells and middle ear cavities are clear. 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 MRI examination may be more helpful to better age the lesion. Remote left cerebellar infarct. Moderate right maxillary sinus disease. Electronically Signed   By: Helyn Numbers M.D.   On: 03/08/2022 20:19   ? ?EKG: Independently reviewed. See abve  ? ?Assessment/Plan ?Acute CVA  ?-ct scan with subacute cva  ?-assc with right side weakness and  slurred speech ?-admit to progressive  ?-start asa , plavix ,statin ?-SLP/OT/PT  ?-mri, echo /cta /lipids/pending  ?-neuro consult to be called in am  ? ? ?Uncontrolled BP  ?-permissive HTN  per protocol  ?-resume oral

## 2022-03-08 NOTE — ED Triage Notes (Addendum)
Pt from home via ems- reports he started having right arm weakness and trouble "getting his words out" yesterday at 6:30 AM. Pt denies CP, SOB, dizziness, visual disturbances, history of stroke or cardiac problems- takes BP medication. ?Pt is AOX4, PERRLA noted, right arm weakness noted, mild expressive aphasia noted, no slurring of speech.  ?BGL 98 ?156/104 ?Pt reports drinking 3 beers today, admits to marijuana use, smokes cigarettes  ?

## 2022-03-08 NOTE — ED Notes (Signed)
Pt with MRI

## 2022-03-08 NOTE — ED Notes (Signed)
Pt given refreshments per request  

## 2022-03-08 NOTE — ED Notes (Signed)
Pt taken by CT 

## 2022-03-08 NOTE — ED Provider Notes (Signed)
? ?[facility] ?Provider Note ? ? ? Event Date/Time  ? First MD Initiated Contact with Patient 03/08/22 1957   ?  (approximate) ? ?History  ? ?Chief Complaint: Extremity Weakness ? ?HPI ? ?James Lambert is a 56 y.o. male with a past medical history of asthma, COPD, hypertension, presents to the emergency department for trouble speaking and decreased strength in the right arm.  According to the patient for the past 2 days he has been having trouble getting his words out has also noticed significant weakness in the right arm.  Patient states family members told me he probably had a stroke so he came to the emergency department for evaluation.  Patient denies any headache.  No other complaints at this time.  Patient does admit to alcohol use. ? ?Physical Exam  ? ?Triage Vital Signs: ?ED Triage Vitals  ?Enc Vitals Group  ?   BP 03/08/22 1945 (!) 159/112  ?   Pulse Rate 03/08/22 1945 78  ?   Resp 03/08/22 1945 (!) 24  ?   Temp 03/08/22 1956 98.1 ?F (36.7 ?C)  ?   Temp Source 03/08/22 1956 Oral  ?   SpO2 03/08/22 1945 100 %  ?   Weight --   ?   Height --   ?   Head Circumference --   ?   Peak Flow --   ?   Pain Score --   ?   Pain Loc --   ?   Pain Edu? --   ?   Excl. in [place]? --   ? ? ?Most recent vital signs: ?Vitals:  ? 03/08/22 1945 03/08/22 1956  ?BP: (!) 159/112   ?Pulse: 78   ?Resp: (!) 24   ?Temp:  98.1 ?F (36.7 ?C)  ?SpO2: 100%   ? ? ?General: Awake, no distress.  ?CV:  Good peripheral perfusion.  Regular rate and rhythm  ?Resp:  Normal effort.  Equal breath sounds bilaterally.  ?Abd:  No distention.  Soft, nontender.  No rebound or guarding. ?Other:  Cranial nerves intact.  Patient does have occasional difficulty getting words out with almost a stuttering type speech at times.  Patient has significant weakness noted in the right upper extremity with 3/5 strength and significant pronator drift in the right upper extremity.  Legs are 4+/5 bilaterally and equal. ? ? ?ED Results / Procedures /  Treatments  ? ?EKG ? ?EKG viewed and interpreted by myself shows a normal sinus rhythm at 75 bpm with a narrow QRS, normal axis, normal intervals, no concerning ST changes. ? ?RADIOLOGY ? ?I personally reviewed the CT images.  No large bleed seen on my evaluation. 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 ? ? ?MEDICATIONS ORDERED IN ED: ?Medications  ?sodium chloride flush (NS) 0.9 % injection 3 mL (has no administration in time range)  ? ? ? ?IMPRESSION / MDM / ASSESSMENT AND PLAN / ED COURSE  ?I reviewed the triage vital signs and the nursing notes. ? ?Patient presents to the emergency department for speech difficulty and right upper extremity weakness over the past 2 days.  Patient is outside of any TNK window.  We will obtain CT imaging the head, lab work and continue to closely monitor.  High suspicion for CVA given physical exam.  If CT negative we will proceed with MR imaging of the brain. ? ?CT scan shows what appears to be a subacute infarct.  Symptoms have been ongoing for  2 days I suspect he will have an acute infarct on MRI.  Patient's lab work is reassuring.  CBC is normal.  Chemistry is normal.  INR is normal.  We will discuss with the hospitalist for admission for likely acute CVA. ? ?NIH Stroke Scale ? ? ?Interval: Baseline ?Time: 8:58 PM ?Person Administering Scale: Harvest Dark ? ?Administer stroke scale items in the order listed. Record performance in each category after each subscale exam. Do not go back and change scores. Follow directions provided for each exam technique. Scores should reflect what the patient does, not what the clinician thinks the patient can do. The clinician should record answers while administering the exam and work quickly. Except where indicated, the patient should not be coached (i.e., repeated requests to patient to make a special effort). ? ? ?1a  Level of consciousness: 0=alert; keenly responsive  ?1b. LOC questions:  0=Performs both tasks  correctly  ?1c. LOC commands: 0=Performs both tasks correctly  ?2.  Best Gaze: 0=normal  ?3.  Visual: 0=No visual loss  ?4. Facial Palsy: 0=Normal symmetric movement  ?5a.  Motor left arm: 0=No drift, limb holds 90 (or 45) degrees for full 10 seconds  ?5b.  Motor right arm: 2=Some effort against gravity, limb cannot get to or maintain (if cured) 90 (or 45) degrees, drifts down to bed, but has some effort against gravity  ?6a. motor left leg: 0=No drift, limb holds 90 (or 45) degrees for full 10 seconds  ?6b  Motor right leg:  0=No drift, limb holds 90 (or 45) degrees for full 10 seconds  ?7. Limb Ataxia: 1=Present in one limb  ?8.  Sensory: 0=Normal; no sensory loss  ?9. Best Language:  1=Mild to moderate aphasia; some obvious loss of fluency or facility of comprehension without significant limitation on ideas expressed or form of expression.  ?10. Dysarthria: 0=Normal  ?11. Extinction and Inattention: 0=No abnormality  ?12. Distal motor function: 1=At least some extension after 5 seconds, but is not fully extended. Any movement of the fingers which is not in response to a command is not scored  ? Total:   6  ? ? ?FINAL CLINICAL IMPRESSION(S) / ED DIAGNOSES  ? ?Right upper extremity weakness ?Aphasia ?CVA ? ?Note:  This document was prepared using Dragon voice recognition software and may include unintentional dictation errors. ?  Harvest Dark, MD ?03/08/22 2059 ? ?

## 2022-03-09 DIAGNOSIS — I639 Cerebral infarction, unspecified: Secondary | ICD-10-CM

## 2022-03-09 LAB — HEMOGLOBIN A1C
Hgb A1c MFr Bld: 5.4 % (ref 4.8–5.6)
Mean Plasma Glucose: 108.28 mg/dL

## 2022-03-09 LAB — LIPID PANEL
Cholesterol: 129 mg/dL (ref 0–200)
HDL: 35 mg/dL — ABNORMAL LOW (ref >40)
LDL Cholesterol: 83 mg/dL (ref 0–99)
Total CHOL/HDL Ratio: 3.7 RATIO
Triglycerides: 54 mg/dL (ref <150)
VLDL: 11 mg/dL (ref 0–40)

## 2022-03-09 LAB — ETHANOL: Alcohol, Ethyl (B): <10 mg/dL (ref <10)

## 2022-03-09 LAB — HIV ANTIBODY (ROUTINE TESTING W REFLEX): HIV Screen 4th Generation wRfx: NONREACTIVE

## 2022-03-09 MED ORDER — AMLODIPINE BESYLATE 10 MG PO TABS
10.0000 mg | ORAL_TABLET | Freq: Every day | ORAL | Status: DC
Start: 2022-03-09 — End: 2022-03-11
  Administered 2022-03-09 – 2022-03-11 (×3): 10 mg via ORAL
  Filled 2022-03-09 (×3): qty 1

## 2022-03-09 MED ORDER — ENOXAPARIN SODIUM 40 MG/0.4ML IJ SOSY
40.0000 mg | PREFILLED_SYRINGE | INTRAMUSCULAR | Status: DC
Start: 2022-03-09 — End: 2022-03-11
  Administered 2022-03-09 – 2022-03-10 (×2): 40 mg via SUBCUTANEOUS
  Filled 2022-03-09 (×2): qty 0.4

## 2022-03-09 MED ORDER — HYDRALAZINE HCL 20 MG/ML IJ SOLN
10.0000 mg | Freq: Four times a day (QID) | INTRAMUSCULAR | Status: DC | PRN
Start: 2022-03-09 — End: 2022-03-11
  Administered 2022-03-10 (×2): 10 mg via INTRAVENOUS
  Filled 2022-03-09 (×2): qty 1

## 2022-03-09 MED ORDER — NICOTINE 14 MG/24HR TD PT24
14.0000 mg | MEDICATED_PATCH | Freq: Every day | TRANSDERMAL | Status: DC
  Filled 2022-03-09 (×3): qty 1

## 2022-03-09 MED ORDER — ACETAMINOPHEN 500 MG PO TABS
1000.0000 mg | ORAL_TABLET | Freq: Three times a day (TID) | ORAL | Status: DC | PRN
  Administered 2022-03-10: 1000 mg via ORAL
  Filled 2022-03-09: qty 2

## 2022-03-09 NOTE — Evaluation (Signed)
Physical Therapy Evaluation ?Patient Details ?Name: James Lambert ?MRN: [id number] ?DOB: 1966-07-25 ?Today's Date: 03/09/2022 ? ?History of Present Illness ? Pt is a 56 y/o M admitted on 03/08/22 after presenting to the ED with c/o RUE weakness & difficulty speaking. MRI reveals patchy multifocal acute ischemic infarcts involving the cortical and subcortical left frontal, parietal, and occipital lobes, within additional ischemic infarcts involving the left greater than right thalami and right cerebellum. PMH: uncontrolled HTN, asthma, tobacco abuse  ?Clinical Impression ? Pt seen for PT evaluation in handoff from OT. Prior to admission pt was residing & providing physical assistance for his brother who has a disability. Pt denies falls & use of AD prior to admission. Pt does demonstrate decreased attention to RUE during session & endorses diplopia. Pt is able to ambulate household distances without AD with CGA fade to supervision. Educated pt on recommendation of HHPT f/u & need for someone to assist his brother until pt fully recovers. Will continue to follow pt acutely to address high level balance, gait & stair negotiation.  ? ?Recommendations for follow up therapy are one component of a multi-disciplinary discharge planning process, led by the attending physician.  Recommendations may be updated based on patient status, additional functional criteria and insurance authorization. ? ?Follow Up Recommendations Home health PT ? ?  ?Assistance Recommended at Discharge Intermittent Supervision/Assistance  ?Patient can return home with the following ? A little help with walking and/or transfers;A little help with bathing/dressing/bathroom;Assistance with cooking/housework;Direct supervision/assist for medications management;Help with stairs or ramp for entrance ? ?  ?Equipment Recommendations None recommended by PT  ?Recommendations for Other Services ?    ?  ?Functional Status Assessment Patient has had a recent decline in  their functional status and demonstrates the ability to make significant improvements in function in a reasonable and predictable amount of time.  ? ?  ?Precautions / Restrictions Precautions ?Precautions: Fall ?Restrictions ?Weight Bearing Restrictions: No  ? ?  ? ?Mobility ? Bed Mobility ?  ?  ?  ?  ?  ?  ?  ?  ?  ? ?Transfers ?Overall transfer level: Needs assistance ?Equipment used: None ?Transfers: Sit to/from Stand ?Sit to Stand: Min guard ?  ?  ?  ?  ?  ?General transfer comment: c/o BLE knee pain during STS transfer ?  ? ?Ambulation/Gait ?Ambulation/Gait assistance: Min guard, Supervision (CGA fade to supervision) ?Gait Distance (Feet):  (50 + 25 ft) ?Assistive device: None ?Gait Pattern/deviations: Decreased weight shift to right, Decreased dorsiflexion - right ?Gait velocity: slightly decreased ?  ?  ?General Gait Details: Pt ambulates with R knee extended throughout swing phase, reports old injury to RLE. ? ?Stairs ?  ?  ?  ?  ?  ? ?Wheelchair Mobility ?  ? ?Modified Rankin (Stroke Patients Only) ?  ? ?  ? ?Balance Overall balance assessment: Needs assistance ?Sitting-balance support: No upper extremity supported, Feet supported ?Sitting balance-Leahy Scale: Good ?  ?  ?Standing balance support: No upper extremity supported, During functional activity ?Standing balance-Leahy Scale: Fair ?  ?  ?  ?  ?  ?  ?  ?  ?  ?  ?  ?  ?   ? ? ? ?Pertinent Vitals/Pain Pain Assessment ?Pain Assessment: Faces ?Faces Pain Scale: Hurts even more ?Pain Location: BLE knees ?Pain Descriptors / Indicators: Aching (chronic) ?Pain Intervention(s): Monitored during session  ? ? ?Home Living Family/patient expects to be discharged to:: Private residence ?Living Arrangements: Other relatives (brother who  has a disability) ?Available Help at Discharge: Friend(s);Available PRN/intermittently;Other (Comment) (pt's brother has a PCA who assists 2x/day & can assist PRN) ?Type of Home: House ?Home Access: Stairs to enter ?Entrance  Stairs-Rails:  (rail in the middle he can use on either L or R) ?Entrance Stairs-Number of Steps: 5 STE from front, and 3 STE from back. ?  ?Home Layout: One level ?Home Equipment: BSC/3in1;Shower seat;Grab bars - tub/shower;Cane - single point ?Additional Comments: DME is pt's brother's equipment but pt has access to it if needed.  ?  ?Prior Function Prior Level of Function : Independent/Modified Independent ?  ?  ?  ?  ?  ?  ?Mobility Comments: Pt reports he was independent with functional mobility, pt provides physical assistance to his brother who has a disability ?ADLs Comments: Pt report he was independent with ADLs/IADLs. Pt was caring for his brother./ ?  ? ? ?Hand Dominance  ? Dominant Hand: Right ? ?  ?Extremity/Trunk Assessment  ? Upper Extremity Assessment ?Upper Extremity Assessment:  (per OT report) ?RUE Deficits / Details: RUE strength grossly 4/5 in all movements, decreased attention to RUE ?RUE Sensation: WNL ?RUE Coordination: decreased fine motor ?  ? ?Lower Extremity Assessment ?Lower Extremity Assessment: Generalized weakness (Pt reports injuries to RLE as a kid, genu valgum during gait with RLE) ? BLE heel to shin fairly equal & intact ?BLE sensation (to light touch) & proprioception intact ? ?   ?Communication  ? Communication: Expressive difficulties  ?Cognition Arousal/Alertness: Awake/alert ?Behavior [facility]: [facility] for tasks assessed/performed ?Overall Cognitive Status: Within Functional Limits for tasks assessed ?  ?  ?  ?  ?  ?  ?  ?  ?  ?  ?  ?  ?  ?  ?  ?  ?General Comments: Pleasant gentleman ?  ?  ? ?  ?General Comments General comments (skin integrity, edema, etc.): BP in LUE at end of session 176/107 mmHg MAP 125 - nurse aware, pt with continent void standing in bathroom without assistance ? ?  ?Exercises    ? ?Assessment/Plan  ?  ?PT Assessment Patient needs continued PT services  ?PT Problem List Decreased strength;Decreased mobility;Decreased activity tolerance;Decreased  balance ? ?   ?  ?PT Treatment Interventions DME instruction;Therapeutic exercise;Gait training;Balance training;Stair training;Neuromuscular re-education;Functional mobility training;Therapeutic activities;Patient/family education   ? ?PT Goals (Current goals can be found in the Care Plan section)  ?Acute Rehab PT Goals ?Patient Stated Goal: go home ?PT Goal Formulation: With patient ?Time For Goal Achievement: 03/23/22 ?Potential to Achieve Goals: Good ? ?  ?Frequency 7X/week ?  ? ? ?Co-evaluation   ?  ?  ?  ?  ? ? ?  ?AM-PAC PT "6 Clicks" Mobility  ?Outcome Measure Help needed turning from your back to your side while in a flat bed without using bedrails?: None ?Help needed moving from lying on your back to sitting on the side of a flat bed without using bedrails?: None ?Help needed moving to and from a bed to a chair (including a wheelchair)?: None ?Help needed standing up from a chair using your arms (e.g., wheelchair or bedside chair)?: None ?Help needed to walk in hospital room?: A Little ?Help needed climbing 3-5 steps with a railing? : A Little ?6 Click Score: 22 ? ?  ?End of Session   ?Activity Tolerance: Patient tolerated treatment well ?Patient left: in bed;with nursing/sitter in room ?Nurse Communication: Mobility status ?PT Visit Diagnosis: Other abnormalities of gait and mobility (R26.89);Unsteadiness on  feet (R26.81) ?  ? ?Time: 6967-8938 ?PT Time Calculation (min) (ACUTE ONLY): 12 min ? ? ?Charges:   PT Evaluation ?$PT Eval Low Complexity: 1 Low ?  ?  ?   ? ? ?Aleda Grana, PT, DPT ?03/09/22, 1:13 PM ? ? ?Sandi Mariscal ?03/09/2022, 1:11 PM ? ?

## 2022-03-09 NOTE — Evaluation (Signed)
Occupational Therapy Evaluation ?Patient Details ?Name: James BalloonJerry M Lambert ?MRN: [id number] ?DOB: 09/03/1966 ?Today's Date: 03/09/2022 ? ? ?History of Present Illness 56 y.o. male with medical history of HTN, asthma,who presents with right arm weakness and trouble "getting his words out". MRI brain, 03/08/22 reveals 1. Patchy multifocal acute ischemic infarcts involving the cortical  and subcortical left frontal, parietal, and occipital lobes, within additional ischemic infarcts involving the left greater than right  thalami and right cerebellum. No associated hemorrhage or mass  effect. A central thromboembolic etiology is suspected given the  various vascular distributions involved.  2. Underlying age-related cerebral atrophy with mild to moderate  chronic small vessel ischemic disease, with a few scattered remote  bilateral cerebellar infarcts, left greater than right, with  additional remote lacunar infarct at the left basal ganglia."  ? ?Clinical Impression ?  ?Pt seen for OT evaluation this date (MD approving for therapy in setting of elevated BP). Upon arrival to room, pt awake and seated upright in bed. Pt A&Ox4 and denying any pain at rest (5/10 LLE pain during movement). Pt reports that at baseline he is independent with ADLs/IADLs, drives, and cares for his brother who has a disability. Pt reports that his friend is helping care for his brother while pt is admitted and that friend will be able to assist pt PRN upon discharge. Pt currently presents with decreased RUE coordination, diplopia (objects side to side), L knee pain, and decreased balance. Due to these functional impairments, pt requires MIN GUARD for seated LB dressing, MIN A for sit>stand transfers with RW, SUPERVISION for functional mobility of household distances (2330ft) with RW, and SUPERVISION for standing grooming tasks. Pt educated on the importance of engaging RUE during fine motor tasks to regain prior level of function and importance of refraining  from driving with current vision and RUE deficits; pt verbalized understanding. Pt would benefit from additional skilled OT services to maximize return to PLOF and minimize risk of future falls, injury, caregiver burden, and readmission. Upon discharge, recommend HHOT services.      ? ?Recommendations for follow up therapy are one component of a multi-disciplinary discharge planning process, led by the attending physician.  Recommendations may be updated based on patient status, additional functional criteria and insurance authorization.  ? ?Follow Up Recommendations ? Home health OT  ?  ?Assistance Recommended at Discharge Intermittent Supervision/Assistance  ?Patient can return home with the following A little help with walking and/or transfers;A little help with bathing/dressing/bathroom;Assistance with cooking/housework;Assist for transportation ? ?  ?Functional Status Assessment ? Patient has had a recent decline in their functional status and demonstrates the ability to make significant improvements in function in a reasonable and predictable amount of time.  ?Equipment Recommendations ? None recommended by OT (pt reports he has a BSC and shower chair. No other DME needed at this time)  ?  ?   ?Precautions / Restrictions Precautions ?Precautions: Fall (moderate fall risk) ?Restrictions ?Weight Bearing Restrictions: No  ? ?  ? ?Mobility Bed Mobility ?Overal bed mobility: Modified Independent ?  ?  ?  ?  ?  ?  ?General bed mobility comments: Able to perform with ease and HOB elevated ?  ? ?Transfers ?Overall transfer level: Needs assistance ?Equipment used: Rolling walker (2 wheels) ?Transfers: Sit to/from Stand ?Sit to Stand: Mod assist, Min assist ?  ?  ?  ?  ?  ?General transfer comment: During initial sit>stand without AD, pt required MOD A for upward momentum and steadying upon  standing. Progressed to MIN A with RW support when standing ?  ? ?  ?Balance Overall balance assessment: Needs  assistance ?Sitting-balance support: No upper extremity supported, Feet supported ?Sitting balance-Leahy Scale: Good ?Sitting balance - Comments: Good sitting balance reaching outside BOS to don socks ?  ?Standing balance support: No upper extremity supported, During functional activity ?Standing balance-Leahy Scale: Fair ?Standing balance comment: Requires SUPERVISION for standing grooming tasks ?  ?  ?  ?  ?  ?  ?  ?  ?  ?  ?  ?   ? ?ADL either performed or assessed with clinical judgement  ? ?ADL Overall ADL's : Needs assistance/impaired ?  ?  ?Grooming: Wash/dry hands;Supervision/safety;Standing ?Grooming Details (indicate cue type and reason): Requires intermittent cues to continue to engage RUE in fine motor tasks (i.e., grabbing paper towl with RUE) ?Upper Body Bathing: Supervision/ safety;Standing ?  ?  ?  ?  ?  ?Lower Body Dressing: Min guard;Sitting/lateral leans ?Lower Body Dressing Details (indicate cue type and reason): to don socks while seated EOB. Requires increased time/effort ?  ?  ?  ?  ?  ?  ?Functional mobility during ADLs: Supervision/safety;Rolling walker (2 wheels) (to walk 67ft) ?   ? ? ? ?Vision Ability to See in Adequate Light: 1 Impaired ?Patient Visual Report: Diplopia ?Vision Assessment?: Yes ?Eye Alignment: Within Functional Limits ?Ocular Range of Motion: Within Functional Limits ?Alignment/Gaze Preference: Within Defined Limits ?Tracking/Visual Pursuits: Decreased smoothness of horizontal tracking ?Visual Fields: No apparent deficits ?Diplopia Assessment: Objects split side to side  ?   ?   ?   ? ?Pertinent Vitals/Pain Pain Assessment ?Pain Assessment: 0-10 ?Pain Score: 5  ?Pain Location: L knee with movement ?Pain Descriptors / Indicators: Aching ?Pain Intervention(s): Monitored during session, Limited activity within patient's tolerance  ? ? ? ?Hand Dominance Right ?  ?Extremity/Trunk Assessment Upper Extremity Assessment ?Upper Extremity Assessment: Overall WFL for tasks  assessed;RUE deficits/detail ?RUE Deficits / Details: RUE strength grossly 4/5 in all movements ?RUE Sensation: WNL ?RUE Coordination: decreased fine motor ?  ?Lower Extremity Assessment ?Lower Extremity Assessment: Overall WFL for tasks assessed ?  ?  ?  ?Communication Communication ?Communication: Expressive difficulties ?  ?Cognition Arousal/Alertness: Awake/alert ?Behavior During Therapy: [facility] for tasks assessed/performed ?Overall Cognitive Status: Within Functional Limits for tasks assessed ?  ?  ?  ?  ?  ?  ?  ?  ?  ?  ?  ?  ?  ?  ?  ?  ?  ?  ?  ?   ?   ?   ? ? ?Home Living Family/patient expects to be discharged to:: Private residence ?Living Arrangements: Other relatives (brother who has a disability) ?Available Help at Discharge: Friend(s);Available PRN/intermittently;Other (Comment) (Pt's brother has a PCA that assists 2x/day and can assist pt if needed) ?Type of Home: House ?Home Access: Stairs to enter ?Entrance Stairs-Number of Steps: 5 STE from front, and 3 STE from back. ?Entrance Stairs-Rails: Right;Left ?Home Layout: One level ?  ?  ?Bathroom Shower/Tub: Tub/shower unit ?  ?  ?  ?  ?Home Equipment: BSC/3in1;Shower seat;Grab bars - tub/shower;Cane - single point ?  ?Additional Comments: DME is pt's brother's equipment but pt has access to it if needed. ? Lives With: Family (brother (has a disability)) ? ?  ?Prior Functioning/Environment Prior Level of Function : Independent/Modified Independent ?  ?  ?  ?  ?  ?  ?Mobility Comments: Pt reports he was independent with functional mobility ?  ADLs Comments: Pt report he was independent with ADLs/IADLs. Pt was caring for his brother and driving ?  ? ?  ?  ?OT Problem List: Decreased strength;Impaired balance (sitting and/or standing);Decreased coordination;Impaired UE functional use ?  ?   ?OT Treatment/Interventions: Self-care/ADL training;Therapeutic exercise;Neuromuscular education;DME and/or AE instruction;Therapeutic activities;Visual/perceptual  remediation/compensation;Patient/family education;Balance training  ?  ?OT Goals(Current goals can be found in the care plan section) Acute Rehab OT Goals ?Patient Stated Goal: to regain independence ?OT Goal Formulation: With patient

## 2022-03-09 NOTE — Progress Notes (Signed)
?PROGRESS NOTE ? ? ? James Lambert?James Lambert  GNF:[id number] DOB: 10-01-1966 DOA: 03/08/2022 ?PCP: Pcp, No  ?258A/258A-AA ? ? ?Assessment & Plan: ?  ?Principal Problem: ?  Acute CVA (cerebrovascular accident) (HCC) ? ? ?James BalloonJerry M Burrill is a 56 y.o. male with medical history  uncontrolled HTN, asthma, tobacco abuse who presents with chief complaint of right arm weakness and trouble "getting his words out" yesterday at 6:30 AM. Per patient he started to notice weakness in his RUE around 3 days  ago. He became concerned when weakness progressed and on the day of presentation he was started to experience speech difficulties. That he describes as inability to speak clearly. ?  ?Acute CVA  ?--presented with right arm weakness and dysarthria. ?--MRI brain showed "Patchy multifocal acute ischemic infarcts involving the cortical and subcortical left frontal, parietal, and occipital lobes, within additional ischemic infarcts involving the left greater than right thalami and right cerebellum." ?Plan: ?--Neuro consult today ?--cont ASA, plavix and statin ?--Echo ?--PT/OT ?--tele ?  ?Uncontrolled BP  ?--resume home amlodipine at increased 10 mg daily ?--IV hydralazine PRN ? ?ETOH ?--if pt starts showing signs of withdrawal, start CIWA ?  ?Asthma COPD ?-not on controller medications  ?  ?Tobacco abuse  ?-discussed need for cessation  ?--cont nicotine patch ? ? ?DVT prophylaxis: Lovenox SQ ?Code Status: Full code  ?Family Communication:  ?Level of care: Progressive ?Dispo:   ?The patient is from: home ?Anticipated d/c is to: home ?Anticipated d/c date is: tomorrow ?Patient currently is not medically ready to d/c due to: pending neuro eval ? ? ?Subjective and Interval History:  ?Pt reported right arm strength and movement improved, and speech improved as well. ? ? ?Objective: ?Vitals:  ? 03/09/22 0336 03/09/22 0846 03/09/22 1133 03/09/22 1208  ?BP: 118/73 (!) 162/104 (!) 181/108 (!) 176/107  ?Pulse: 74 71 68 70  ?Resp: 15 18 20    ?Temp: 98.2  ?F (36.8 ?C) 98.4 ?F (36.9 ?C) 98.3 ?F (36.8 ?C)   ?TempSrc: Oral  Oral   ?SpO2: 97% 100% 100%   ?Weight:      ?Height:      ? ? ?Intake/Output Summary (Last 24 hours) at 03/09/2022 1613 ?Last data filed at 03/09/2022 1505 ?Gross per 24 hour  ?Intake 240 ml  ?Output 850 ml  ?Net -610 ml  ? ?Filed Weights  ? 03/08/22 2002  ?Weight: 113.4 kg  ? ? ?Examination:  ? ?Constitutional: NAD, AAOx3 ?HEENT: conjunctivae and lids normal, EOMI ?CV: No cyanosis.   ?RESP: normal respiratory effort, on RA ?SKIN: warm, dry ?Neuro: II - XII grossly intact.   ?Psych: Normal mood and affect.  Appropriate judgement and reason ? ? ?Data Reviewed: I have personally reviewed following labs and imaging studies ? ?CBC: ?Recent Labs  ?Lab 03/08/22 ?1949  ?WBC 5.6  ?NEUTROABS 2.6  ?HGB 12.5*  ?HCT 37.7*  ?MCV 93.1  ?PLT 351  ? ?Basic Metabolic Panel: ?Recent Labs  ?Lab 03/08/22 ?1949  ?NA 135  ?K 3.7  ?CL 101  ?CO2 23  ?GLUCOSE 89  ?BUN 12  ?CREATININE 1.13  ?CALCIUM 9.1  ? ?GFR: ?Estimated Creatinine Clearance: 101.6 mL/min (by C-G formula based on SCr of 1.13 mg/dL). ?Liver Function Tests: ?Recent Labs  ?Lab 03/08/22 ?1949  ?AST 16  ?ALT 10  ?ALKPHOS 69  ?BILITOT 0.4  ?PROT 8.4*  ?ALBUMIN 3.4*  ? ?No results for input(s): LIPASE, AMYLASE in the last 168 hours. ?No results for input(s): AMMONIA in the last 168 hours. ?Coagulation Profile: ?  Recent Labs  ?Lab 03/08/22 ?1949  ?INR 1.2  ? ?Cardiac Enzymes: ?No results for input(s): CKTOTAL, CKMB, CKMBINDEX, TROPONINI in the last 168 hours. ?BNP (last 3 results) ?No results for input(s): PROBNP in the last 8760 hours. ?HbA1C: ?Recent Labs  ?  03/09/22 ?0040  ?HGBA1C 5.4  ? ?CBG: ?Recent Labs  ?Lab 03/08/22 ?2020  ?GLUCAP 90  ? ?Lipid Profile: ?Recent Labs  ?  03/09/22 ?0040  ?CHOL 129  ?HDL 35*  ?LDLCALC 83  ?TRIG 54  ?CHOLHDL 3.7  ? ?Thyroid Function Tests: ?No results for input(s): TSH, T4TOTAL, FREET4, T3FREE, THYROIDAB in the last 72 hours. ?Anemia Panel: ?No results for input(s): VITAMINB12,  FOLATE, FERRITIN, TIBC, IRON, RETICCTPCT in the last 72 hours. ?Sepsis Labs: ?No results for input(s): PROCALCITON, LATICACIDVEN in the last 168 hours. ? ?No results found for this or any previous visit (from the past 240 hour(s)).  ? ? ?Radiology Studies: ?CT ANGIO HEAD NECK W WO CM ? ?Result Date: 03/08/2022 ?CLINICAL DATA:  Initial evaluation for acute stroke. EXAM: CT ANGIOGRAPHY HEAD AND NECK TECHNIQUE: Multidetector CT imaging of the head and neck was performed using the standard protocol during bolus administration of intravenous contrast. Multiplanar CT image reconstructions and MIPs were obtained to evaluate the vascular anatomy. Carotid stenosis measurements (when applicable) are obtained utilizing NASCET criteria, using the distal internal carotid diameter as the denominator. RADIATION DOSE REDUCTION: This exam was performed according to the departmental dose-optimization program which includes automated exposure control, adjustment of the mA and/or kV according to patient size and/or use of iterative reconstruction technique. CONTRAST:  75mL OMNIPAQUE IOHEXOL 350 MG/ML SOLN COMPARISON:  Prior CT and MRI from earlier the same day. FINDINGS: CTA NECK FINDINGS Aortic arch: Visualized aortic arch normal caliber with normal branch pattern. Mild atheromatous change about the arch itself. No hemodynamically significant stenosis about the origin the great vessels. Right carotid system: Right CCA patent from its origin to the bifurcation without stenosis. Bulky calcified plaque about the right carotid bulb/proximal right ICA with associated stenosis of up to approximate 50% by NASCET criteria. Right ICA patent distally without stenosis or dissection. Left carotid system: Left CCA patent from its origin to the bifurcation without stenosis. Scattered calcified plaque about the left carotid bulb/proximal left ICA with no more than mild 30% stenosis by NASCET criteria. Left ICA patent distally without stenosis or  dissection. Vertebral arteries: Both vertebral arteries arise from subclavian arteries. No proximal subclavian artery stenosis. Vertebral arteries patent without significant stenosis or dissection. Skeleton: No discrete or worrisome osseous lesions. Mild cervical spondylosis without significant spinal stenosis. Other neck: No other acute soft tissue abnormality within the neck. Upper chest: Visualized upper chest demonstrates no acute finding. Review of the MIP images confirms the above findings CTA HEAD FINDINGS Anterior circulation: Petrous segments patent without significant stenosis. Extensive atheromatous disease throughout the carotid siphons with associated moderate to severe multifocal stenoses bilaterally. A1 segments patent. Normal anterior communicating artery complex. Anterior cerebral arteries patent without stenosis. No significant M1 stenosis. No proximal MCA branch occlusion. Distal MCA branches perfused and symmetric. Diffuse small vessel atheromatous irregularity. Posterior circulation: Extensive atheromatous change within the V4 segments bilaterally with associated moderate to severe stenoses on the right, and more mild-to-moderate stenoses on the left. Severe atheromatous disease seen throughout the basilar artery with associated severe multifocal stenoses about its proximal and mid aspect. Superior cerebellar arteries patent bilaterally. Both PCAs primarily supplied via the basilar. Extensive atheromatous disease seen throughout the left PCA with associated  severe multifocal stenoses. More mild-to-moderate atheromatous disease noted within the right PCA. Both PCAs remain perfused to their distal aspects. Venous sinuses: Patent allowing for timing the contrast bolus. Anatomic variants: None significant.  No aneurysm. Review of the MIP images confirms the above findings IMPRESSION: 1. Negative CTA for large vessel occlusion or other emergent finding. 2. Atheromatous plaque about the right greater  than left carotid bifurcations with associated stenosis of up to 50% on the right and 30% on the left. 3. 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

## 2022-03-09 NOTE — TOC Progression Note (Addendum)
Transition of Care (TOC) - Progression Note  ? ? ?Patient Details  ?Name: James Lambert ?MRN: [id number] ?Date of Birth: [birth year] ? ?Transition of Care (TOC) CM/SW Contact  ?Bing Quarry, RN ?Phone Number: ?03/09/2022, 1:47 PM ? ?Clinical Narrative: PT recommnended HH PT. Pt does not have insurance. Contacted [facility], [place], ([facility]) and they will check but will not confirm till Monday 03/10/22. Patient also would need assistance with transportation to outpatient therapy (all closed today). 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 Neurology to clear prior to discharge. TOC Will try to assess these barriers Monday. Gabriel Cirri RN CM  ? ? ? ?  ?  ? ?Expected Discharge Plan and Services ?  ?  ?  ?  ?  ?                ?  ?  ?  ?  ?  ?  ?  ?  ?  ?  ? ? ?Social Determinants of Health (SDOH) Interventions ?  ? ?Readmission Risk Interventions ?   ? View : No data to display.  ?  ?  ?  ? ? ?

## 2022-03-09 NOTE — Consult Note (Signed)
Neurology Consultation ?Reason for Consult: Stroke ?Referring Physician: Lolita Cram ? ?CC: Stroke ? ?History is obtained from:Patient ? ?HPI: James Lambert is a 56 y.o. male who started noticing numbness of his right side on Friday.  He states that since that time, his symptoms have actually started improving slightly, but he still has significant difficulty with right side.  Due to not improving, he sought care in the emergency department where an MRI was performed which demonstrates multifocal ischemic areas in an embolic distribution. ? ? ?LKW: Friday ?tpa given?: no, outside of window ? ? ?ROS: A 14 point ROS was performed and is negative except as noted in the HPI.  ?Past Medical History:  ?Diagnosis Date  ? Asthma   ? COPD (chronic obstructive pulmonary disease) ([place])   ? Hidradenitis suppurativa   ? diagnosed in [facility] ED based on history and physical exam  ? Hypertension   ? ? ? ?History reviewed. No pertinent family history. ? ? ?Social History:  reports that he has been smoking cigarettes. He has been smoking an average of .5 packs per day. He has never used smokeless tobacco. He reports current alcohol use. He reports current drug use. Drug: Marijuana. ? ? ?Exam: ?Current vital signs: ?BP (!) 162/104   Pulse 71   Temp 98.4 ?F (36.9 ?C)   Resp 18   Ht 6\' 3"  (1.905 m)   Wt 113.4 kg   SpO2 100%   BMI 31.25 kg/m?  ?Vital signs in last 24 hours: ?Temp:  [98.1 ?F (36.7 ?C)-98.4 ?F (36.9 ?C)] 98.4 ?F (36.9 ?C) (04/02 [id number]) ?Pulse Rate:  [71-83] 71 (04/02 0846) ?Resp:  [15-24] 18 (04/02 0846) ?BP: (118-177)/(73-123) 162/104 (04/02 0846) ?SpO2:  [96 %-100 %] 100 % (04/02 0846) ?Weight:  [113.4 kg] 113.4 kg (04/01 2002) ? ? ?Physical Exam  ?Constitutional: Appears well-developed and well-nourished.  ?Psych: Affect appropriate to situation ?Eyes: No scleral injection ?HENT: No OP obstruction ?MSK: no joint deformities.  ?Cardiovascular: Normal rate and regular rhythm.  ?Respiratory: Effort normal, non-labored  breathing ?GI: Soft.  No distension. There is no tenderness.  ?Skin: WDI ? ?Neuro: ?Mental Status: ?Patient is awake, alert, oriented to person, place, month, year, and situation. ?Patient is able to give a clear and coherent history. ?No signs of aphasia or neglect ?Cranial Nerves: ?II: Visual Fields are full. Pupils are equal, round, and reactive to light.   ?III,IV, VI: EOMI without ptosis or diploplia.  ?V: Facial sensation is symmetric to temperature ?VII: Facial movement with mild right facial weakness.  ?VIII: hearing is intact to voice ?X: Uvula elevates symmetrically ?XI: Shoulder shrug is symmetric. ?XII: tongue is midline without atrophy or fasciculations.  ?Motor: ?Though he has no drift in either upper extremity, he clearly has some impaired fine motor function and 4/5 weakness of the right upper and lower extremities. ?Sensory: ?Sensation is symmetric to light touch and temperature in the arms and legs. ?Cerebellar: ?He has impairment consistent with weakness on the right ? ? ? ? ?I have reviewed labs in epic and the results pertinent to this consultation are: ?LDL 83 ?A1C 5.4 ? ?I have reviewed the images obtained: CTA-multifocal atherosclerotic disease, MRI-Central embolic appearing pattern of infarcts. ? ?Impression: 57 year old male with multifocal infarcts in an embolic distribution.  He will need a full embolic work-up, if no source is found on telemetry or TTE, he will need a TEE and prolonged cardiac monitoring (e.g. implanted loop recorder) ? ?Recommendations: ?1) though I am not certain that  this is an atheromatous stroke, I would favor aggressive statin therapy given his age advanced intracranial athero-sclerosis.  Agree with high intensity statin therapy ?2) aspirin 81 mg daily and Plavix 75 mg daily for 3 weeks followed by monotherapy ?3) echocardiogram ?4) telemetry ?5) PT, OT, ST ?6) neurology will follow ? ? ?Roland Rack, MD ?[facility] ?[phone] ? ?If 7pm- 7am,  please page neurology on call as listed in [place]. ? ?

## 2022-03-09 NOTE — Evaluation (Signed)
Speech Language Pathology Evaluation ?Patient Details ?Name: James Lambert ?MRN: [id number] ?DOB: December 13, 1965 ?Today's Date: 03/09/2022 ?Time: 6759-1638 ?SLP Time Calculation (min) (ACUTE ONLY): 20 min ? ?Problem List:  ?Patient Active Problem List  ? Diagnosis Date Noted  ? Acute CVA (cerebrovascular accident) (HCC) 03/08/2022  ? ?Past Medical History:  ?Past Medical History:  ?Diagnosis Date  ? Asthma   ? COPD (chronic obstructive pulmonary disease) (HCC)   ? Hidradenitis suppurativa   ? diagnosed in [facility] ED based on history and physical exam  ? Hypertension   ? ?Past Surgical History: History reviewed. No pertinent surgical history. ?HPI:  ?Per Physician's H&P "James Lambert is a 56 y.o. male with medical history  uncontrolled HTN, asthma, tobacco abuse who presents with chief complaint of right arm weakness and trouble "getting his words out" yesterday at 6:30 AM. Per patient he started to notice weakness in his RUE around 3 days  ago. He became concerned when weakness progressed and on the day of presentation he was started to experience speech difficulties. That he describes as inability to speak clearly. Patient denies any associated headache, vision changes, difficulty swallowing, numbness or weakness of any other extremities. He also denies chest , shortness of breath or cough. He does endorse continued etoh use as  well as tobacco use."  MRI James, 03/08/22, "1. Patchy multifocal acute ischemic infarcts involving the cortical ?and subcortical left frontal, parietal, and occipital lobes, within ?additional ischemic infarcts involving the left greater than right ?thalami and right cerebellum. No associated hemorrhage or mass ?effect. A central thromboembolic etiology is suspected given the ?various vascular distributions involved. ?2. Underlying age-related cerebral atrophy with mild to moderate ?chronic small vessel ischemic disease, with a few scattered remote ?bilateral cerebellar infarcts, left greater than  right, with ?additional remote lacunar infarct at the left basal ganglia." ? ?Assessment / Plan / Recommendation ?Clinical Impression ? Pt seen for cognitive/language evaluation. Pt alert, pleasant, and cooperative. Reports resolution of wordfinding difficulty.  ? ?Evaluation completed via informal means, portions of Western Aphasia Battery Revised (Bedside Record Form), and portions of Cognistat. Pt demonstrated intact basic functional cognitive-linguistic ability. Pt without observed difficulty with auditory comprehension (with exception of x1 self-correction with complex yes/no question) or verbal expression. Pt's speech is fluent, appropriate, and without s/sx dysarthria. Pt endorses being at cognitive-linguistic baseline.  ? ?SLP to sign off as pt has no acute SLP needs. Pt and RN made aware of results of assessment and SLP POC. Pt verbalized understanding/agreement.  ?   ?SLP Assessment ? SLP Recommendation/Assessment: Patient does not need any further Speech Lanaguage Pathology Services ?SLP Visit Diagnosis: Aphasia (R47.01)  ?  ?Recommendations for follow up therapy are one component of a multi-disciplinary discharge planning process, led by the attending physician.  Recommendations may be updated based on patient status, additional functional criteria and insurance authorization. ?   ?Follow Up Recommendations ? No SLP follow up  ?  ?Assistance Recommended at Discharge ? PRN (reports having assistance with driving PTA)  ?Functional Status Assessment Patient has not had a recent decline in their functional status  ?   ?SLP Evaluation ?Cognition ? Overall Cognitive Status: Within Functional Limits for tasks assessed ?Arousal/Alertness: Awake/alert ?Orientation Level: Oriented X4 ?Memory: Appears intact (functional) ?Awareness: Appears intact ?Problem Solving: Appears intact (verbal)  ?  ?   ?Comprehension ? Auditory Comprehension ?Overall Auditory Comprehension: Appears within functional limits for tasks  assessed ?Yes/No Questions: Within Functional Limits (x1 self-correction noted on complex yes/no question) ?Commands:  Within Functional Limits ?Conversation: Simple ?Visual Recognition/Discrimination ?Discrimination: Within Function Limits ?Reading Comprehension ?Reading Status:  (DNT; reading glasses not present)  ?  ?Expression Expression ?Primary Mode of Expression: Verbal ?Verbal Expression ?Overall Verbal Expression: Appears within functional limits for tasks assessed ?Initiation: No impairment ?Repetition: No impairment ?Naming: No impairment ?Written Expression ?Written Expression: Not tested   ?Oral / Motor ? Oral Motor/Sensory Function ?Overall Oral Motor/Sensory Function: Within functional limits ?Motor Speech ?Overall Motor Speech: Appears within functional limits for tasks assessed ?Respiration: Within functional limits ?Resonance: Within functional limits ?Articulation: Within functional limitis ?Intelligibility: Intelligible ?Motor Planning: Witnin functional limits   ?        ?James Lambert, M.S., CCC-SLP ?Speech-Language Pathologist ?[facility] ?[phone] (ASCOM)  ? ?James Lambert ?03/09/2022, 11:24 AM ? ?

## 2022-03-10 ENCOUNTER — Inpatient Hospital Stay
Admit: 2022-03-10 | Discharge: 2022-03-10 | Disposition: A | Discharge status: Home / Self Care | Payer: Medicaid Other | Attending: Internal Medicine | Admitting: Internal Medicine

## 2022-03-10 LAB — ECHOCARDIOGRAM COMPLETE
AR max vel: 2.62 cm2
AV Area VTI: 2.64 cm2
AV Area mean vel: 2.58 cm2
AV Mean grad: 4 mmHg
AV Peak grad: 7.3 mmHg
Ao pk vel: 1.35 m/s
Area-P 1/2: 2.64 cm2
Calc EF: 55.7 %
Height: 75 in
MV VTI: 1.97 cm2
Single Plane A2C EF: 59.6 %
Single Plane A4C EF: 56.1 %
Weight: 4000 oz

## 2022-03-10 LAB — CBC
HCT: 38.6 % — ABNORMAL LOW (ref 39.0–52.0)
Hemoglobin: 12.7 g/dL — ABNORMAL LOW (ref 13.0–17.0)
MCH: 30.8 pg (ref 26.0–34.0)
MCHC: 32.9 g/dL (ref 30.0–36.0)
MCV: 93.7 fL (ref 80.0–100.0)
Platelets: 364 10*3/uL (ref 150–400)
RBC: 4.12 MIL/uL — ABNORMAL LOW (ref 4.22–5.81)
RDW: 13.8 % (ref 11.5–15.5)
WBC: 5.9 10*3/uL (ref 4.0–10.5)
nRBC: 0 % (ref 0.0–0.2)

## 2022-03-10 LAB — BASIC METABOLIC PANEL
Anion gap: 8 (ref 5–15)
BUN: 10 mg/dL (ref 6–20)
CO2: 27 mmol/L (ref 22–32)
Calcium: 9 mg/dL (ref 8.9–10.3)
Chloride: 100 mmol/L (ref 98–111)
Creatinine, Ser: 1.01 mg/dL (ref 0.61–1.24)
GFR, Estimated: >60 mL/min (ref >60)
Glucose, Bld: 97 mg/dL (ref 70–99)
Potassium: 3.6 mmol/L (ref 3.5–5.1)
Sodium: 135 mmol/L (ref 135–145)

## 2022-03-10 LAB — MAGNESIUM: Magnesium: 1.8 mg/dL (ref 1.7–2.4)

## 2022-03-10 NOTE — Progress Notes (Signed)
PT Cancellation Note ? ?Patient Details ?Name: James Lambert ?MRN: [id number] ?DOB: 07-20-1966 ? ? ?Cancelled Treatment:     Therapist in to see pt this afternoon, pt resting in bed, stated he was too tired to complete any activity. Therapist educating pt on the importance of mobility, pt stated he is getting up and using the bathroom on his own throughout the day.  Will attempt continued PT next available time/date.  ? ? ?Jannet Askew ?03/10/2022, 2:02 PM ?

## 2022-03-10 NOTE — Progress Notes (Signed)
Neurology Progress Note ? ?Patient ID: James Lambert is a 56 y.o. with PMHx of  has a past medical history of Asthma, COPD (chronic obstructive pulmonary disease) (HCC), Hidradenitis suppurativa, and Hypertension.  As well as ongoing smoking (half a pack per day), marijuana use, and alcohol use.  He presented with right-sided numbness and mild weakness in the right face arm and leg starting on Friday, March 31, and was found to have strokes in an embolic distribution ? ? ?Major interval events/Subjective: ?- Feels weakness is improving ?- 2 doses PRN IV hydral yesterday, labetalol today ? ?Exam: ?Current vital signs: ?BP (!) 172/111 (BP Location: Left Arm)   Pulse 66   Temp 98.2 ?F (36.8 ?C) (Oral)   Resp 18   Ht 6\' 3"  (1.905 m)   Wt 113.4 kg   SpO2 96%   BMI 31.25 kg/m?  ?Vital signs in last 24 hours: ?Temp:  [98 ?F (36.7 ?C)-98.5 ?F (36.9 ?C)] 98.1 ?F (36.7 ?C) (04/04 1234) ?Pulse Rate:  [62-90] 78 (04/04 1234) ?Resp:  [10-25] 16 (04/04 1234) ?BP: (139-193)/(91-126) 165/113 (04/04 1234) ?SpO2:  [93 %-100 %] 97 % (04/04 1234) ? ? ?Gen: In bed, comfortable  ?Resp: non-labored breathing, no grossly audible wheezing ?Cardiac: Perfusing extremities well  ?Abd: soft, nt ? ?Neuro: ?MS: Alert, awake, oriented ?CN: Face symmetric, PERRL, EOMI ?Motor: Mild right hand weakness and slowness otherwise intact ?Sensory: Reports equal sensation ? ?Pertinent Labs: ?Transthoracic echo pending ? ? ?Basic Metabolic Panel: ?Recent Labs  ?Lab 03/08/22 ?1949 03/10/22 ?0532  ?NA 135 135  ?K 3.7 3.6  ?CL 101 100  ?CO2 23 27  ?GLUCOSE 89 97  ?BUN 12 10  ?CREATININE 1.13 1.01  ?CALCIUM 9.1 9.0  ?MG  --  1.8  ? ? ?CBC: ?Recent Labs  ?Lab 03/08/22 ?1949 03/10/22 ?0532  ?WBC 5.6 5.9  ?NEUTROABS 2.6  --   ?HGB 12.5* 12.7*  ?HCT 37.7* 38.6*  ?MCV 93.1 93.7  ?PLT 351 364  ? ? ?Coagulation Studies: ?Recent Labs  ?  03/08/22 ?1949  ?LABPROT 14.7  ?INR 1.2  ?  ?LDL 83 ?A1C 5.4 ?  ?I have reviewed the images obtained: CTA-multifocal  atherosclerotic disease, MRI-Central embolic appearing pattern of infarcts. ?CTA full read ?1. Negative CTA for large vessel occlusion or other emergent ?finding. ?2. Atheromatous plaque about the right greater than left carotid ?bifurcations with associated stenosis of up to 50% on the right and ?30% on the left. ?3. Extensive age advanced intracranial atheromatous disease, most ?pronounced about the posterior circulation where there are moderate ?to severe multifocal stenoses involving the right V4 segment, ?basilar artery, and left greater than right PCAs. Additional ?moderate to severe stenoses involving both carotid siphons. ?MRI full read: ?1. Patchy multifocal acute ischemic infarcts involving the cortical ?and subcortical left frontal, parietal, and occipital lobes, within ?additional ischemic infarcts involving the left greater than right ?thalami and right cerebellum. No associated hemorrhage or mass ?effect. A central thromboembolic etiology is suspected given the ?various vascular distributions involved. ?2. Underlying age-related cerebral atrophy with mild to moderate ?chronic small vessel ischemic disease, with a few scattered remote ?bilateral cerebellar infarcts, left greater than right, with ?additional remote lacunar infarct at the left basal ganglia. ? ? ?ECHO TTE:  ? 1. Left ventricular ejection fraction, by estimation, is 60 to 65%. The  ?left ventricle has normal function. The left ventricle has no regional  ?wall motion abnormalities. Left ventricular diastolic parameters were  ?normal.  ? 2. Right ventricular systolic  function is normal. The right ventricular  ?size is normal.  ? 3. The mitral valve is normal in structure. Trivial mitral valve  ?regurgitation.  ? 4. The aortic valve is normal in structure. Aortic valve regurgitation is  ?not visualized.  ? ?ECHO TEE ? 1. Left ventricular ejection fraction, by estimation, is 60 to 65%. The  ?left ventricle has normal function.  ? 2. Right  ventricular systolic function is normal. The right ventricular  ?size is normal.  ? 3. No left atrial/left atrial appendage thrombus was detected.  ? 4. The mitral valve is normal in structure. Mild mitral valve  ?regurgitation.  ? 5. The aortic valve is normal in structure. Aortic valve regurgitation is  ?trivial.  ? 6. There is mild (Grade II) plaque.  ? 7. Cannot exclude a small PFO. Agitated saline contrast bubble study was  ?positive with shunting observed within 3-6 cardiac cycles suggestive of  ?interatrial shunt. There is a small patent foramen ovale with  ?bidirectional shunting across atrial septum.  ? ?Impression: Multifocal strokes, concerning for central embolic source but no cardiac pathology identified other than possible small PFO.  ? ?Recommendations: ?- BPs continue to be high, needs close PCP follow-up and gradual titration of meds to be continued on discharge ?- Continue aspirin 81 mg daily and Plavix 75 mg daily for 90 days due to intracerebral atherosclerotic disease followed by monotherapy ?- If no arrhythmias are captured on telemetry he will need an implanted loop recorder, per cardiology will need to happen on an outpatient basis ?- Outpatient consideration of risks/benefits of PFO closure ?- Neurology will sign off at this time ? ?Brooke Dare MD-PhD ?[facility] ?[phone]  ? ?Greater than 35 minutes were spent in care of this patient today, greater than 50% of which was at bedside ? ?

## 2022-03-10 NOTE — Progress Notes (Signed)
?PROGRESS NOTE ? ? ? LUCIUS Lambert  [id number] DOB: May 03, 1966 DOA: 03/08/2022 ?PCP: Pcp, No  ?258A/258A-AA ? ? ?Assessment & Plan: ?  ?Principal Problem: ?  Acute CVA (cerebrovascular accident) ([place]) ? ? ?[name] is a 56 y.o. male with medical history  uncontrolled HTN, asthma, tobacco abuse who presents with chief complaint of right arm weakness and trouble "getting his words out" yesterday at 6:30 AM. Per patient he started to notice weakness in his RUE around 3 days  ago. He became concerned when weakness progressed and on the day of presentation he was started to experience speech difficulties. That he describes as inability to speak clearly. ?  ?Acute CVA  ?--presented with right arm weakness and dysarthria. ?--MRI brain showed "Patchy multifocal acute ischemic infarcts involving the cortical and subcortical left frontal, parietal, and occipital lobes, within additional ischemic infarcts involving the left greater than right thalami and right cerebellum." ?--Neuro consulted, high suspicion for embolic stroke. ?--Echo showed no thrombus. ?Plan: ?--cardio consult for TEE, per neuro rec ?--cont ASA, plavix and statin ?--cont tele to capture Afib ?--may need loop recorder ?  ?Uncontrolled BP  ?--cont home amlodipine at increased 10 mg daily ? ?ETOH ?--if pt starts showing signs of withdrawal, start CIWA ?  ?Asthma COPD ?-not on controller medications  ?  ?Tobacco abuse  ?-discussed need for cessation  ?--cont nicotine patch ? ? ?DVT prophylaxis: Lovenox SQ ?Code Status: Full code  ?Family Communication:  ?Level of care: Progressive ?Dispo:   ?The patient is from: home ?Anticipated d/c is to: home ?Anticipated d/c date is: tomorrow ?Patient currently is not medically ready to d/c due to: pending TEE ? ? ?Subjective and Interval History:  ?No new complaints.   ? ? ?Objective: ?Vitals:  ? 03/10/22 0352 03/10/22 0912 03/10/22 1045 03/10/22 1332  ?BP: (!) 172/111 (!) 153/109 (!) 158/105 (!) 143/75  ?Pulse: 66  95  88  ?Resp: 18 18  18   ?Temp: 98.2 ?F (36.8 ?C) 98.1 ?F (36.7 ?C)    ?TempSrc: Oral     ?SpO2: 96% 98%  100%  ?Weight:      ?Height:      ? ? ?Intake/Output Summary (Last 24 hours) at 03/10/2022 1538 ?Last data filed at 03/10/2022 1416 ?Gross per 24 hour  ?Intake 600 ml  ?Output 1000 ml  ?Net -400 ml  ? ?Filed Weights  ? 03/08/22 2002  ?Weight: 113.4 kg  ? ? ?Examination:  ? ?Constitutional: NAD, AAOx3 ?HEENT: conjunctivae and lids normal, EOMI ?CV: No cyanosis.   ?RESP: normal respiratory effort, on RA ?Extremities: No effusions, edema in BLE ?SKIN: warm, dry ?Neuro: II - XII grossly intact.   ? ? ?Data Reviewed: I have personally reviewed following labs and imaging studies ? ?CBC: ?Recent Labs  ?Lab 03/08/22 ?1949 03/10/22 ?0532  ?WBC 5.6 5.9  ?NEUTROABS 2.6  --   ?HGB 12.5* 12.7*  ?HCT 37.7* 38.6*  ?MCV 93.1 93.7  ?PLT 351 364  ? ?Basic Metabolic Panel: ?Recent Labs  ?Lab 03/08/22 ?1949 03/10/22 ?0532  ?NA 135 135  ?K 3.7 3.6  ?CL 101 100  ?CO2 23 27  ?GLUCOSE 89 97  ?BUN 12 10  ?CREATININE 1.13 1.01  ?CALCIUM 9.1 9.0  ?MG  --  1.8  ? ?GFR: ?Estimated Creatinine Clearance: 113.6 mL/min (by C-G formula based on SCr of 1.01 mg/dL). ?Liver Function Tests: ?Recent Labs  ?Lab 03/08/22 ?1949  ?AST 16  ?ALT 10  ?ALKPHOS 69  ?BILITOT 0.4  ?PROT  8.4*  ?ALBUMIN 3.4*  ? ?No results for input(s): LIPASE, AMYLASE in the last 168 hours. ?No results for input(s): AMMONIA in the last 168 hours. ?Coagulation Profile: ?Recent Labs  ?Lab 03/08/22 ?1949  ?INR 1.2  ? ?Cardiac Enzymes: ?No results for input(s): CKTOTAL, CKMB, CKMBINDEX, TROPONINI in the last 168 hours. ?BNP (last 3 results) ?No results for input(s): PROBNP in the last 8760 hours. ?HbA1C: ?Recent Labs  ?  03/09/22 ?0040  ?HGBA1C 5.4  ? ?CBG: ?Recent Labs  ?Lab 03/08/22 ?2020  ?GLUCAP 90  ? ?Lipid Profile: ?Recent Labs  ?  03/09/22 ?0040  ?CHOL 129  ?HDL 35*  ?[place] 83  ?TRIG 54  ?CHOLHDL 3.7  ? ?Thyroid Function Tests: ?No results for input(s): TSH, T4TOTAL, FREET4,  T3FREE, THYROIDAB in the last 72 hours. ?Anemia Panel: ?No results for input(s): VITAMINB12, FOLATE, FERRITIN, TIBC, IRON, RETICCTPCT in the last 72 hours. ?Sepsis Labs: ?No results for input(s): PROCALCITON, LATICACIDVEN in the last 168 hours. ? ?No results found for this or any previous visit (from the past 240 hour(s)).  ? ? ?Radiology Studies: ?CT ANGIO HEAD NECK W WO CM ? ?Result Date: 03/08/2022 ?CLINICAL DATA:  Initial evaluation for acute stroke. EXAM: CT ANGIOGRAPHY HEAD AND NECK TECHNIQUE: Multidetector CT imaging of the head and neck was performed using the standard protocol during bolus administration of intravenous contrast. Multiplanar CT image reconstructions and MIPs were obtained to evaluate the vascular anatomy. Carotid stenosis measurements (when applicable) are obtained utilizing NASCET criteria, using the distal internal carotid diameter as the denominator. RADIATION DOSE REDUCTION: This exam was performed according to the departmental dose-optimization program which includes automated exposure control, adjustment of the mA and/or kV according to patient size and/or use of iterative reconstruction technique. CONTRAST:  65mL OMNIPAQUE IOHEXOL 350 MG/ML SOLN COMPARISON:  Prior CT and MRI from earlier the same day. FINDINGS: CTA NECK FINDINGS Aortic arch: Visualized aortic arch normal caliber with normal branch pattern. Mild atheromatous change about the arch itself. No hemodynamically significant stenosis about the origin the great vessels. Right carotid system: Right CCA patent from its origin to the bifurcation without stenosis. Bulky calcified plaque about the right carotid bulb/proximal right ICA with associated stenosis of up to approximate 50% by NASCET criteria. Right ICA patent distally without stenosis or dissection. Left carotid system: Left CCA patent from its origin to the bifurcation without stenosis. Scattered calcified plaque about the left carotid bulb/proximal left ICA with no more  than mild 30% stenosis by NASCET criteria. Left ICA patent distally without stenosis or dissection. Vertebral arteries: Both vertebral arteries arise from subclavian arteries. No proximal subclavian artery stenosis. Vertebral arteries patent without significant stenosis or dissection. Skeleton: No discrete or worrisome osseous lesions. Mild cervical spondylosis without significant spinal stenosis. Other neck: No other acute soft tissue abnormality within the neck. Upper chest: Visualized upper chest demonstrates no acute finding. Review of the MIP images confirms the above findings CTA HEAD FINDINGS Anterior circulation: Petrous segments patent without significant stenosis. Extensive atheromatous disease throughout the carotid siphons with associated moderate to severe multifocal stenoses bilaterally. A1 segments patent. Normal anterior communicating artery complex. Anterior cerebral arteries patent without stenosis. No significant M1 stenosis. No proximal MCA branch occlusion. Distal MCA branches perfused and symmetric. Diffuse small vessel atheromatous irregularity. Posterior circulation: Extensive atheromatous change within the V4 segments bilaterally with associated moderate to severe stenoses on the right, and more mild-to-moderate stenoses on the left. Severe atheromatous disease seen throughout the basilar artery with associated severe multifocal  stenoses about its proximal and mid aspect. Superior cerebellar arteries patent bilaterally. Both PCAs primarily supplied via the basilar. Extensive atheromatous disease seen throughout the left PCA with associated severe multifocal stenoses. More mild-to-moderate atheromatous disease noted within the right PCA. Both PCAs remain perfused to their distal aspects. Venous sinuses: Patent allowing for timing the contrast bolus. Anatomic variants: None significant.  No aneurysm. Review of the MIP images confirms the above findings IMPRESSION: 1. Negative CTA for large  vessel occlusion or other emergent finding. 2. Atheromatous plaque about the right greater than left carotid bifurcations with associated stenosis of up to 50% on the right and 30% on the left. 3. Extens

## 2022-03-10 NOTE — TOC Progression Note (Signed)
Transition of Care (TOC) - Progression Note  ? ? ?Patient Details  ?Name: James Lambert ?MRN: [id number] ?Date of Birth: 1966/12/07 ? ?Transition of Care (TOC) CM/SW Contact  ?Gildardo Griffes, LCSW ?Phone Number: ?03/10/2022, 3:49 PM ? ?Clinical Narrative:    ? ?Per Meg with [facility] who is charity home health they are able to provide [facility] for patient with start date of 4/17, MD made aware plan for dc tomorrow.  ? ?  ?  ? ?Expected Discharge Plan and Services ?  ?  ?  ?  ?  ?                ?  ?  ?  ?  ?  ?  ?  ?  ?  ?  ? ? ?Social Determinants of Health (SDOH) Interventions ?  ? ?Readmission Risk Interventions ?   ? View : No data to display.  ?  ?  ?  ? ? ?

## 2022-03-10 NOTE — Progress Notes (Addendum)
Occupational Therapy Treatment ?Patient Details ?Name: James Lambert ?MRN: [id number] ?DOB: September 07, 1966 ?Today's Date: 03/10/2022 ? ? ?History of present illness Pt is a 56 y/o M admitted on 03/08/22 after presenting to the ED with c/o RUE weakness & difficulty speaking. MRI reveals patchy multifocal acute ischemic infarcts involving the cortical and subcortical left frontal, parietal, and occipital lobes, within additional ischemic infarcts involving the left greater than right thalami and right cerebellum. PMH: uncontrolled HTN, asthma, tobacco abuse ?  ?OT comments ? Pt seen for OT treatment on this date. Upon arrival to room, pt awake and resting in bed. Pt reporting no pain and agreeable to OT tx. BP at beginning of session 153/109 mmHg; nurse aware and provided pt with hydralazine via IV at beginning of session. Pt continues to present with decreased RUE coordination, diplopia (objects side to side), and decreased balance. Due to these functional impairments, pt requires MIN GUARD for seated LB dressing, MIN GUARD for step pivot transfer bed>recliner, and SET-UP assist for seated grooming tasks.  ? ?During session pt issued with GREEN theraputty and instructed on strengthening and coordination exercises for R hand, including gross grasping, 3 point pinching, and digit abd/add. Pt able to return demo with intermittent vc for technique to improve quality of movement. Pt encouraged to perform for 5-10 min, 3x per day. Attempted to assess pt's visual attention and task switching skills during [employer], however pt reporting being unable to read assessment items d/t baseline visual acuity deficits (pt reports his glasses are at home). Pt continues to benefit from skilled OT services to maximize return to PLOF and minimize risk of future falls, injury, caregiver burden, and readmission. Will continue to follow POC. Discharge recommendation remains appropriate.    ? ?Recommendations for follow up therapy are one  component of a multi-disciplinary discharge planning process, led by the attending physician.  Recommendations may be updated based on patient status, additional functional criteria and insurance authorization. ?   ?Follow Up Recommendations ? Home health OT  ?  ?Assistance Recommended at Discharge Intermittent Supervision/Assistance  ?Patient can return home with the following ? A little help with walking and/or transfers;A little help with bathing/dressing/bathroom;Assistance with cooking/housework;Assist for transportation ?  ?Equipment Recommendations ? None recommended by OT (pt reports he has a BSC and shower chair. No other DME needed at this time)  ?  ?   ?Precautions / Restrictions Precautions ?Precautions: Fall ?Restrictions ?Weight Bearing Restrictions: No  ? ? ?  ? ?Mobility Bed Mobility ?Overal bed mobility: Modified Independent ?  ?  ?  ?  ?  ?  ?General bed mobility comments: Able to perform with ease and HOB elevated ?  ? ?Transfers ?Overall transfer level: Needs assistance ?Equipment used: None ?Transfers: Sit to/from Stand ?Sit to Stand: Min guard ?  ?  ?  ?  ?  ?  ?  ?  ?Balance Overall balance assessment: Needs assistance ?Sitting-balance support: No upper extremity supported, Feet supported ?Sitting balance-Leahy Scale: Good ?Sitting balance - Comments: Good sitting balance reaching outside BOS to don socks ?  ?Standing balance support: No upper extremity supported, During functional activity ?Standing balance-Leahy Scale: Fair ?  ?  ?  ?  ?  ?  ?  ?  ?  ?  ?  ?  ?   ? ?ADL either performed or assessed with clinical judgement  ? ?ADL Overall ADL's : Needs assistance/impaired ?  ?  ?Grooming: Applying deodorant;Set up;Sitting ?  ?  ?  ?  ?  ?  ?  ?  Lower Body Dressing: Min guard;Sitting/lateral leans ?Lower Body Dressing Details (indicate cue type and reason): to don socks while seated EOB. Requires increased time/effort ?  ?  ?  ?  ?  ?  ?  ?  ?  ? ? ? ?Cognition Arousal/Alertness:  Awake/alert ?Behavior During Therapy: [facility] for tasks assessed/performed ?Overall Cognitive Status: Within Functional Limits for tasks assessed ?  ?  ?  ?  ?  ?  ?  ?  ?  ?  ?  ?  ?  ?  ?  ?  ?  ?  ?  ?   ?   ?   ?General Comments BP in LUE at beginning of session 153/109 mmHg; nurse aware and provided pt with hydralazine via IV at beginning of session  ? ? ?Pertinent Vitals/ Pain       Pain Assessment ?Pain Assessment: No/denies pain ? ?   ?   ? ?Frequency ? Min 3X/week  ? ? ? ? ?  ?Progress Toward Goals ? ?OT Goals(current goals can now be found in the care plan section) ? Progress towards OT goals: Progressing toward goals ? ?Acute Rehab OT Goals ?Patient Stated Goal: to regain independence ?OT Goal Formulation: With patient ?Time For Goal Achievement: 03/23/22 ?Potential to Achieve Goals: Good  ?Plan Discharge plan remains appropriate;Frequency remains appropriate   ? ?   ?AM-PAC OT "6 Clicks" Daily Activity     ?Outcome Measure ? ? Help from another person eating meals?: None ?Help from another person taking care of personal grooming?: A Little ?Help from another person toileting, which includes using toliet, bedpan, or urinal?: A Little ?Help from another person bathing (including washing, rinsing, drying)?: A Lot ?Help from another person to put on and taking off regular upper body clothing?: None ?Help from another person to put on and taking off regular lower body clothing?: A Little ?6 Click Score: 19 ? ?  ?End of Session   ? ?OT Visit Diagnosis: Unsteadiness on feet (R26.81);Hemiplegia and hemiparesis ?Hemiplegia - Right/Left: Right ?Hemiplegia - dominant/non-dominant: Dominant ?Hemiplegia - caused by: Cerebral infarction ?  ?Activity Tolerance Patient tolerated treatment well ?  ?Patient Left in chair;with call bell/phone within reach ?  ?Nurse Communication Mobility status ?  ? ?   ? ?Time: [id number] ?OT Time Calculation (min): 31 min ? ?Charges: OT General Charges ?$OT Visit: 1 Visit ?OT  Treatments ?$Self Care/Home Management : 8-22 mins ?$Therapeutic Activity: 8-22 mins ? ?Fredirick Maudlin, OTR/L ?[place] ? ?

## 2022-03-10 NOTE — Consult Note (Signed)
? ?[facility] Cardiology Consultation Note  ?Patient ID: James Lambert, MRN: [id number], DOB/AGE: 08-18-1966 56 y.o. ?Admit date: 03/08/2022   Date of Consult: 03/10/2022 ?Primary Physician: Pcp, No ?Primary Cardiologist: None ? ?Chief Complaint:  ?Chief Complaint  ?Patient presents with  ?? Extremity Weakness  ? ?Reason for Consult:  Stroke ? ?HPI: 56 y.o. male with tobacco abuse hypertension hyperlipidemia not well controlled having right-sided weakness and a MRI showing multiple patchy episodes of stroke in the brain.  The patient does feel better today than yesterday when he was having these strokelike symptoms and is hemodynamically stable.  Echocardiogram has shown no primary issue of embolic phenomenon and will need further investigation.  There has been no evidence of atrial fibrillation or other rhythm disturbances or history as such.  The patient has not had heart failure or acute coronary syndrome. ? ?Past Medical History:  ?Diagnosis Date  ?? Asthma   ?? COPD (chronic obstructive pulmonary disease) (HCC)   ?? Hidradenitis suppurativa   ? diagnosed in [facility] ED based on history and physical exam  ?? Hypertension   ?   ? ?Surgical History: History reviewed. No pertinent surgical history.  ? ?Home Meds: ?Prior to Admission medications   ?Medication Sig Start Date End Date Taking? Authorizing Provider  ?amLODipine (NORVASC) 5 MG tablet Take 1 tablet (5 mg total) by mouth once daily. 02/21/22 05/22/22 Yes Georga Hacking, MD  ?albuterol [facility] HFA) 108 [phone] Base) MCG/ACT inhaler Inhale 2 puffs into the lungs once every 6 (six) hours as needed for wheezing or shortness of breath. 01/01/22   Gilles Chiquito, MD  ? ? ?Inpatient Medications:  ?? amLODipine  10 mg Oral Daily  ?? aspirin EC  81 mg Oral Daily  ?? atorvastatin  80 mg Oral Daily  ?? clopidogrel  75 mg Oral Daily  ?? enoxaparin (LOVENOX) injection  40 mg Subcutaneous Q24H  ?? nicotine  14 mg Transdermal Daily  ?? sodium chloride flush  3 mL Intravenous  Once  ? ? ? ?Allergies: No Known Allergies ? ?Social History  ? ?Socioeconomic History  ?? Marital status: Single  ?  Spouse name: Not on file  ?? Number of children: Not on file  ?? Years of education: Not on file  ?? Highest education level: Not on file  ?Occupational History  ?? Not on file  ?Tobacco Use  ?? Smoking status: Every Day  ?  Packs/day: 0.50  ?  Types: Cigarettes  ?? Smokeless tobacco: Never  ?Vaping Use  ?? Vaping Use: Never used  ?Substance and Sexual Activity  ?? Alcohol use: Yes  ?  Comment: daily  ?? Drug use: Yes  ?  Types: Marijuana  ?? Sexual activity: Not on file  ?Other Topics Concern  ?? Not on file  ?Social History Narrative  ?? Not on file  ? ?Social Determinants of Health  ? ?Financial Resource Strain: Not on file  ?Food Insecurity: Not on file  ?Transportation Needs: Not on file  ?Physical Activity: Not on file  ?Stress: Not on file  ?Social Connections: Not on file  ?Intimate Partner Violence: Not on file  ?  ? ?History reviewed. No pertinent family history.  ? ?Review of Systems ?Positive for none ?Negative for: ?General:  chills, fever, night sweats or weight changes.  ?Cardiovascular: PND orthopnea syncope dizziness  ?Dermatological skin lesions rashes ?Respiratory: Cough congestion ?Urologic: Frequent urination urination at night and hematuria ?Abdominal: negative for nausea, vomiting, diarrhea, bright red blood per rectum,  melena, or hematemesis ?Neurologic: negative for visual changes, and/or hearing changes  ?All other systems reviewed and are otherwise negative except as noted above. ? ?Labs: ?No results for input(s): CKTOTAL, CKMB, TROPONINI in the last 72 hours. ?Lab Results  ?Component Value Date  ? WBC 5.9 03/10/2022  ? HGB 12.7 (L) 03/10/2022  ? HCT 38.6 (L) 03/10/2022  ? MCV 93.7 03/10/2022  ? PLT 364 03/10/2022  ?  ?Recent Labs  ?Lab 03/08/22 ?1949 03/10/22 ?0532  ?NA 135 135  ?K 3.7 3.6  ?CL 101 100  ?CO2 23 27  ?BUN 12 10  ?CREATININE 1.13 1.01  ?CALCIUM 9.1 9.0  ?PROT  8.4*  --   ?BILITOT 0.4  --   ?ALKPHOS 69  --   ?ALT 10  --   ?AST 16  --   ?GLUCOSE 89 97  ? ?Lab Results  ?Component Value Date  ? CHOL 129 03/09/2022  ? HDL 35 (L) 03/09/2022  ? LDLCALC 83 03/09/2022  ? TRIG 54 03/09/2022  ? ?No results found for: DDIMER ? ?Radiology/Studies:  ?CT ANGIO HEAD NECK W WO CM ? ?Result Date: 03/08/2022 ?CLINICAL DATA:  Initial evaluation for acute stroke. EXAM: CT ANGIOGRAPHY HEAD AND NECK TECHNIQUE: Multidetector CT imaging of the head and neck was performed using the standard protocol during bolus administration of intravenous contrast. Multiplanar CT image reconstructions and MIPs were obtained to evaluate the vascular anatomy. Carotid stenosis measurements (when applicable) are obtained utilizing NASCET criteria, using the distal internal carotid diameter as the denominator. RADIATION DOSE REDUCTION: This exam was performed according to the departmental dose-optimization program which includes automated exposure control, adjustment of the mA and/or kV according to patient size and/or use of iterative reconstruction technique. CONTRAST:  53mL OMNIPAQUE IOHEXOL 350 MG/ML SOLN COMPARISON:  Prior CT and MRI from earlier the same day. FINDINGS: CTA NECK FINDINGS Aortic arch: Visualized aortic arch normal caliber with normal branch pattern. Mild atheromatous change about the arch itself. No hemodynamically significant stenosis about the origin the great vessels. Right carotid system: Right CCA patent from its origin to the bifurcation without stenosis. Bulky calcified plaque about the right carotid bulb/proximal right ICA with associated stenosis of up to approximate 50% by NASCET criteria. Right ICA patent distally without stenosis or dissection. Left carotid system: Left CCA patent from its origin to the bifurcation without stenosis. Scattered calcified plaque about the left carotid bulb/proximal left ICA with no more than mild 30% stenosis by NASCET criteria. Left ICA patent distally  without stenosis or dissection. Vertebral arteries: Both vertebral arteries arise from subclavian arteries. No proximal subclavian artery stenosis. Vertebral arteries patent without significant stenosis or dissection. Skeleton: No discrete or worrisome osseous lesions. Mild cervical spondylosis without significant spinal stenosis. Other neck: No other acute soft tissue abnormality within the neck. Upper chest: Visualized upper chest demonstrates no acute finding. Review of the MIP images confirms the above findings CTA HEAD FINDINGS Anterior circulation: Petrous segments patent without significant stenosis. Extensive atheromatous disease throughout the carotid siphons with associated moderate to severe multifocal stenoses bilaterally. A1 segments patent. Normal anterior communicating artery complex. Anterior cerebral arteries patent without stenosis. No significant M1 stenosis. No proximal MCA branch occlusion. Distal MCA branches perfused and symmetric. Diffuse small vessel atheromatous irregularity. Posterior circulation: Extensive atheromatous change within the V4 segments bilaterally with associated moderate to severe stenoses on the right, and more mild-to-moderate stenoses on the left. Severe atheromatous disease seen throughout the basilar artery with associated severe multifocal stenoses about its proximal  and mid aspect. Superior cerebellar arteries patent bilaterally. Both PCAs primarily supplied via the basilar. Extensive atheromatous disease seen throughout the left PCA with associated severe multifocal stenoses. More mild-to-moderate atheromatous disease noted within the right PCA. Both PCAs remain perfused to their distal aspects. Venous sinuses: Patent allowing for timing the contrast bolus. Anatomic variants: None significant.  No aneurysm. Review of the MIP images confirms the above findings IMPRESSION: 1. Negative CTA for large vessel occlusion or other emergent finding. 2. Atheromatous plaque  about the right greater than left carotid bifurcations with associated stenosis of up to 50% on the right and 30% on the left. 3. 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

## 2022-03-11 ENCOUNTER — Other Ambulatory Visit: Payer: Self-pay

## 2022-03-11 ENCOUNTER — Encounter
Admission: EM | Disposition: A | Discharge status: Home Health | Payer: Self-pay | Source: Home / Self Care | Attending: Hospitalist

## 2022-03-11 ENCOUNTER — Inpatient Hospital Stay
Admit: 2022-03-11 | Discharge: 2022-03-11 | Disposition: A | Discharge status: Home / Self Care | Payer: Medicaid Other | Attending: Internal Medicine | Admitting: Internal Medicine

## 2022-03-11 HISTORY — PX: TEE WITHOUT CARDIOVERSION: SHX5443

## 2022-03-11 LAB — CBC
HCT: 38 % — ABNORMAL LOW (ref 39.0–52.0)
Hemoglobin: 12.5 g/dL — ABNORMAL LOW (ref 13.0–17.0)
MCH: 30.6 pg (ref 26.0–34.0)
MCHC: 32.9 g/dL (ref 30.0–36.0)
MCV: 93.1 fL (ref 80.0–100.0)
Platelets: 373 10*3/uL (ref 150–400)
RBC: 4.08 MIL/uL — ABNORMAL LOW (ref 4.22–5.81)
RDW: 13.7 % (ref 11.5–15.5)
WBC: 6.1 10*3/uL (ref 4.0–10.5)
nRBC: 0 % (ref 0.0–0.2)

## 2022-03-11 LAB — BASIC METABOLIC PANEL
Anion gap: 8 (ref 5–15)
BUN: 12 mg/dL (ref 6–20)
CO2: 26 mmol/L (ref 22–32)
Calcium: 8.9 mg/dL (ref 8.9–10.3)
Chloride: 100 mmol/L (ref 98–111)
Creatinine, Ser: 0.97 mg/dL (ref 0.61–1.24)
GFR, Estimated: >60 mL/min (ref >60)
Glucose, Bld: 99 mg/dL (ref 70–99)
Potassium: 3.7 mmol/L (ref 3.5–5.1)
Sodium: 134 mmol/L — ABNORMAL LOW (ref 135–145)

## 2022-03-11 LAB — MAGNESIUM: Magnesium: 1.8 mg/dL (ref 1.7–2.4)

## 2022-03-11 SURGERY — ECHOCARDIOGRAM, TRANSESOPHAGEAL
Anesthesia: Moderate Sedation

## 2022-03-11 MED ORDER — ATORVASTATIN CALCIUM 80 MG PO TABS
80.0000 mg | ORAL_TABLET | Freq: Every day | ORAL | 0 refills | Status: DC
  Filled 2022-03-11 (×2): qty 30, 30d supply, fill #0

## 2022-03-11 MED ORDER — LIDOCAINE VISCOUS HCL 2 % MT SOLN
OROMUCOSAL | Status: AC
  Filled 2022-03-11: qty 15

## 2022-03-11 MED ORDER — LIDOCAINE VISCOUS HCL 2 % MT SOLN
OROMUCOSAL | Status: AC | PRN
  Administered 2022-03-11: 15 mL via OROMUCOSAL

## 2022-03-11 MED ORDER — AMLODIPINE BESYLATE 5 MG PO TABS
10.0000 mg | ORAL_TABLET | Freq: Every day | ORAL | 2 refills | Status: DC
  Filled 2022-03-11: qty 30, 30d supply, fill #0

## 2022-03-11 MED ORDER — ASPIRIN 81 MG PO TBEC: 81.0000 mg | DELAYED_RELEASE_TABLET | Freq: Every day | ORAL | Status: DC

## 2022-03-11 MED ORDER — BUTAMBEN-TETRACAINE-BENZOCAINE 2-2-14 % EX AERO
INHALATION_SPRAY | CUTANEOUS | Status: AC
  Filled 2022-03-11: qty 5

## 2022-03-11 MED ORDER — LORAZEPAM 2 MG/ML IJ SOLN
INTRAMUSCULAR | Status: AC | PRN
  Administered 2022-03-11: 1 mg via INTRAVENOUS

## 2022-03-11 MED ORDER — MIDAZOLAM HCL 2 MG/2ML IJ SOLN
INTRAMUSCULAR | Status: AC
  Filled 2022-03-11: qty 4

## 2022-03-11 MED ORDER — FENTANYL CITRATE (PF) 100 MCG/2ML IJ SOLN
INTRAMUSCULAR | Status: AC | PRN
  Administered 2022-03-11: 50 ug via INTRAVENOUS
  Administered 2022-03-11 (×2): 25 ug via INTRAVENOUS

## 2022-03-11 MED ORDER — METOPROLOL SUCCINATE ER 25 MG PO TB24
25.0000 mg | ORAL_TABLET | Freq: Every day | ORAL | 2 refills | Status: DC
  Filled 2022-03-11 – 2022-04-30 (×2): qty 30, 30d supply, fill #0

## 2022-03-11 MED ORDER — MIDAZOLAM HCL 2 MG/2ML IJ SOLN
INTRAMUSCULAR | Status: AC
  Filled 2022-03-11: qty 2

## 2022-03-11 MED ORDER — BUTAMBEN-TETRACAINE-BENZOCAINE 2-2-14 % EX AERO
INHALATION_SPRAY | CUTANEOUS | Status: AC | PRN
  Administered 2022-03-11: 2 via TOPICAL

## 2022-03-11 MED ORDER — CLOPIDOGREL BISULFATE 75 MG PO TABS
75.0000 mg | ORAL_TABLET | Freq: Every day | ORAL | 0 refills | Status: DC
  Filled 2022-03-11: qty 30, 30d supply, fill #0

## 2022-03-11 MED ORDER — SODIUM CHLORIDE 0.9 % IV SOLN: INTRAVENOUS | Status: DC

## 2022-03-11 MED ORDER — MIDAZOLAM HCL 2 MG/2ML IJ SOLN
INTRAMUSCULAR | Status: AC | PRN
  Administered 2022-03-11: 2 mg via INTRAVENOUS
  Administered 2022-03-11: 1 mg via INTRAVENOUS

## 2022-03-11 MED ORDER — FENTANYL CITRATE (PF) 100 MCG/2ML IJ SOLN
INTRAMUSCULAR | Status: AC
  Filled 2022-03-11: qty 2

## 2022-03-11 MED ORDER — SODIUM CHLORIDE FLUSH 0.9 % IV SOLN
INTRAVENOUS | Status: AC
  Filled 2022-03-11: qty 10

## 2022-03-11 MED ORDER — METOPROLOL SUCCINATE ER 25 MG PO TB24
25.0000 mg | ORAL_TABLET | Freq: Every day | ORAL | Status: DC
  Administered 2022-03-11: 25 mg via ORAL
  Filled 2022-03-11: qty 1

## 2022-03-11 NOTE — Progress Notes (Signed)
Physical Therapy Treatment ?Patient Details ?Name: James Lambert ?MRN: [id number] ?DOB: 06/18/1966 ?Today's Date: 03/11/2022 ? ? ?History of Present Illness Pt is a 56 y/o M admitted on 03/08/22 after presenting to the ED with c/o RUE weakness & difficulty speaking. MRI reveals patchy multifocal acute ischemic infarcts involving the cortical and subcortical left frontal, parietal, and occipital lobes, within additional ischemic infarcts involving the left greater than right thalami and right cerebellum. PMH: uncontrolled HTN, asthma, tobacco abuse ? ?  ?PT Comments  ? ? Pt seen this pm for mobility training prior to d/c.  Pt able to transfer with Supervision from various surfaces. Negotiation of 5 steps with single rail with Supervision. Gait training in hall 116ft without AD and supervision. Pt appears at functional baseline for d/c home with assist from friend. No equipment needs at this time. ?   ?Recommendations for follow up therapy are one component of a multi-disciplinary discharge planning process, led by the attending physician.  Recommendations may be updated based on patient status, additional functional criteria and insurance authorization. ? ?Follow Up Recommendations ? Home health PT ?  ?  ?Assistance Recommended at Discharge Intermittent Supervision/Assistance  ?Patient can return home with the following A little help with walking and/or transfers;A little help with bathing/dressing/bathroom;Assistance with cooking/housework;Direct supervision/assist for medications management ?  ?Equipment Recommendations ? None recommended by PT  ?  ?Recommendations for Other Services   ? ? ?  ?Precautions / Restrictions Precautions ?Precautions: Fall ?Restrictions ?Weight Bearing Restrictions: No  ?  ? ?Mobility ? Bed Mobility ?  ?  ?  ?  ?  ?  ?  ?  ?  ? ?Transfers ?Overall transfer level: Needs assistance ?Equipment used: None ?Transfers: Sit to/from Stand ?Sit to Stand: Supervision ?  ?  ?  ?  ?  ?General transfer  comment:  (Pt fleses trunk fully prior to standing inorder to weight shift forward) ?  ? ?Ambulation/Gait ?Ambulation/Gait assistance: Supervision, Modified independent (Device/Increase time) ?Gait Distance (Feet): 120 Feet ?Assistive device: None ?Gait Pattern/deviations: Decreased weight shift to right, Decreased dorsiflexion - right, Wide base of support ?Gait velocity: slightly decreased ?  ?  ?General Gait Details: Pt ambulates with R knee extended throughout swing phase, reports old injury to RLE. ? ? ?Stairs ?Stairs: Yes ?Stairs assistance: Supervision ?Stair Management: One rail Left ?Number of Stairs: 5 ?General stair comments:  (awkward but appears safe) ? ? ?Wheelchair Mobility ?  ? ?Modified Rankin (Stroke Patients Only) ?  ? ? ?  ?Balance   ?  ?  ?  ?  ?  ?  ?  ?  ?  ?  ?  ?  ?  ?  ?  ?  ?  ?  ?  ? ?  ?Cognition Arousal/Alertness: Awake/alert ?Behavior During Therapy: [facility] for tasks assessed/performed ?Overall Cognitive Status: Within Functional Limits for tasks assessed ?  ?  ?  ?  ?  ?  ?  ?  ?  ?  ?  ?  ?  ?  ?  ?  ?  ?  ?  ? ?  ?Exercises   ? ?  ?General Comments   ?  ?  ? ?Pertinent Vitals/Pain Pain Assessment ?Pain Assessment: No/denies pain  ? ? ?Home Living   ?  ?  ?  ?  ?  ?  ?  ?  ?  ?   ?  ?Prior Function    ?  ?  ?   ? ?  PT Goals (current goals can now be found in the care plan section) Acute Rehab PT Goals ?Patient Stated Goal: go home ?Progress towards PT goals: Progressing toward goals ? ?  ?Frequency ? ? ? 7X/week ? ? ? ?  ?PT Plan Current plan remains appropriate  ? ? ?Co-evaluation   ?  ?  ?  ?  ? ?  ?AM-PAC PT "6 Clicks" Mobility   ?Outcome Measure ? Help needed turning from your back to your side while in a flat bed without using bedrails?: None ?Help needed moving from lying on your back to sitting on the side of a flat bed without using bedrails?: None ?Help needed moving to and from a bed to a chair (including a wheelchair)?: None ?Help needed standing up from a chair using your  arms (e.g., wheelchair or bedside chair)?: None ?Help needed to walk in hospital room?: A Little ?Help needed climbing 3-5 steps with a railing? : A Little ?6 Click Score: 22 ? ?  ?End of Session Equipment Utilized During Treatment: Gait belt ?Activity Tolerance: Patient tolerated treatment well ?Patient left: in bed;with call bell/phone within reach ?Nurse Communication: Mobility status ?PT Visit Diagnosis: Other abnormalities of gait and mobility (R26.89);Unsteadiness on feet (R26.81) ?  ? ? ?Time: 1411-1440 ?PT Time Calculation (min) (ACUTE ONLY): 29 min ? ?Charges:  $Gait Training: 8-22 mins ?$Therapeutic Activity: 8-22 mins          ?          ?Zadie Cleverly, PTA ? ? ? ?Jannet Askew ?03/11/2022, 3:51 PM ? ?

## 2022-03-11 NOTE — Progress Notes (Signed)
Patient has PCP apt with [facility] clinic on Monday May 15th at 1:40 (need to arrive by 1:20) to see Dr. Wonda Horner.  ? ?[facility] agency informed of above and information has been input on patient's follow up provider list.  ?[place], [place] ?[phone] ? ?

## 2022-03-11 NOTE — Progress Notes (Signed)
[facility] Cardiology [facility] Encounter Note ? ?Patient: James Lambert / Admit Date: 03/08/2022 / Date of Encounter: 03/11/2022, 1:23 PM ? ? ?Subjective: ?Patient is overall improved today with no evidence of significant concerns of progression of stroke.  The patient has undergone a transesophageal echocardiogram showing normal LV systolic function with normal valves with mild mitral regurgitation and no evidence of thrombus in the left atrium.  In addition there is a small patent foramen ovale with right to left shunt but minimal at that. ?The patient does have minimal aortic atherosclerosis ?Review of Systems: ?Positive for: Shortness of breath ?Negative for: Vision change, hearing change, syncope, dizziness, nausea, vomiting,diarrhea, bloody stool, stomach pain, cough, congestion, diaphoresis, urinary frequency, urinary pain,skin lesions, skin rashes ?Others previously listed ? ?Objective: ?Telemetry: Normal sinus rhythm ?Physical Exam: Blood pressure (!) 165/113, pulse 78, temperature 98.1 ?F (36.7 ?C), resp. rate 16, height 6\' 3"  (1.905 m), weight 113.4 kg, SpO2 97 %. Body mass index is 31.25 kg/m?. ?General: Well developed, well nourished, in no acute distress. ?Head: Normocephalic, atraumatic, sclera non-icteric, no xanthomas, nares are without discharge. ?Neck: No apparent masses ?Lungs: Normal respirations with no wheezes, no rhonchi, no rales , no crackles  ? Heart: Regular rate and rhythm, normal S1 S2, no murmur, no rub, no gallop, PMI is normal size and placement, carotid upstroke normal without bruit, jugular venous pressure normal ?Abdomen: Soft, non-tender, non-distended with normoactive bowel sounds. No hepatosplenomegaly. Abdominal aorta is normal size without bruit ?Extremities: No edema, no clubbing, no cyanosis, no ulcers,  ?Peripheral: 2+ radial, 2+ femoral, 2+ dorsal pedal pulses ?Neuro: Alert and oriented. Moves all extremities spontaneously. ?Psych:  Responds to questions appropriately  with a normal affect. ? ? ?Intake/Output Summary (Last 24 hours) at 03/11/2022 1323 ?Last data filed at 03/11/2022 0251 ?Gross per 24 hour  ?Intake 960 ml  ?Output 500 ml  ?Net 460 ml  ? ? ?Inpatient Medications:  ?? amLODipine  10 mg Oral Daily  ?? aspirin EC  81 mg Oral Daily  ?? atorvastatin  80 mg Oral Daily  ?? butamben-tetracaine-benzocaine      ?? clopidogrel  75 mg Oral Daily  ?? enoxaparin (LOVENOX) injection  40 mg Subcutaneous Q24H  ?? fentaNYL      ?? lidocaine      ?? midazolam      ?? nicotine  14 mg Transdermal Daily  ?? sodium chloride flush  3 mL Intravenous Once  ?? sodium chloride flush      ? ?Infusions:  ? ?Labs: ?Recent Labs  ?  03/10/22 ?0532 03/11/22 ?0358  ?NA 135 134*  ?K 3.6 3.7  ?CL 100 100  ?CO2 27 26  ?GLUCOSE 97 99  ?BUN 10 12  ?CREATININE 1.01 0.97  ?CALCIUM 9.0 8.9  ?MG 1.8 1.8  ? ?Recent Labs  ?  03/08/22 ?1949  ?AST 16  ?ALT 10  ?ALKPHOS 69  ?BILITOT 0.4  ?PROT 8.4*  ?ALBUMIN 3.4*  ? ?Recent Labs  ?  03/08/22 ?1949 03/10/22 ?0532 03/11/22 ?0358  ?WBC 5.6 5.9 6.1  ?NEUTROABS 2.6  --   --   ?HGB 12.5* 12.7* 12.5*  ?HCT 37.7* 38.6* 38.0*  ?MCV 93.1 93.7 93.1  ?PLT 351 364 373  ? ?No results for input(s): CKTOTAL, CKMB, TROPONINI in the last 72 hours. ?Invalid input(s): POCBNP ?Recent Labs  ?  03/09/22 ?0040  ?HGBA1C 5.4  ?  ? ?Weights: ?05/09/22 Weights  ? 03/08/22 2002  ?Weight: 113.4 kg  ? ? ? ?Radiology/Studies:  ?  CT ANGIO HEAD NECK W WO CM ? ?Result Date: 03/08/2022 ?CLINICAL DATA:  Initial evaluation for acute stroke. EXAM: CT ANGIOGRAPHY HEAD AND NECK TECHNIQUE: Multidetector CT imaging of the head and neck was performed using the standard protocol during bolus administration of intravenous contrast. Multiplanar CT image reconstructions and MIPs were obtained to evaluate the vascular anatomy. Carotid stenosis measurements (when applicable) are obtained utilizing NASCET criteria, using the distal internal carotid diameter as the denominator. RADIATION DOSE REDUCTION: This exam was  performed according to the departmental dose-optimization program which includes automated exposure control, adjustment of the mA and/or kV according to patient size and/or use of iterative reconstruction technique. CONTRAST:  75mL OMNIPAQUE IOHEXOL 350 MG/ML SOLN COMPARISON:  Prior CT and MRI from earlier the same day. FINDINGS: CTA NECK FINDINGS Aortic arch: Visualized aortic arch normal caliber with normal branch pattern. Mild atheromatous change about the arch itself. No hemodynamically significant stenosis about the origin the great vessels. Right carotid system: Right CCA patent from its origin to the bifurcation without stenosis. Bulky calcified plaque about the right carotid bulb/proximal right ICA with associated stenosis of up to approximate 50% by NASCET criteria. Right ICA patent distally without stenosis or dissection. Left carotid system: Left CCA patent from its origin to the bifurcation without stenosis. Scattered calcified plaque about the left carotid bulb/proximal left ICA with no more than mild 30% stenosis by NASCET criteria. Left ICA patent distally without stenosis or dissection. Vertebral arteries: Both vertebral arteries arise from subclavian arteries. No proximal subclavian artery stenosis. Vertebral arteries patent without significant stenosis or dissection. Skeleton: No discrete or worrisome osseous lesions. Mild cervical spondylosis without significant spinal stenosis. Other neck: No other acute soft tissue abnormality within the neck. Upper chest: Visualized upper chest demonstrates no acute finding. Review of the MIP images confirms the above findings CTA HEAD FINDINGS Anterior circulation: Petrous segments patent without significant stenosis. Extensive atheromatous disease throughout the carotid siphons with associated moderate to severe multifocal stenoses bilaterally. A1 segments patent. Normal anterior communicating artery complex. Anterior cerebral arteries patent without  stenosis. No significant M1 stenosis. No proximal MCA branch occlusion. Distal MCA branches perfused and symmetric. Diffuse small vessel atheromatous irregularity. Posterior circulation: Extensive atheromatous change within the V4 segments bilaterally with associated moderate to severe stenoses on the right, and more mild-to-moderate stenoses on the left. Severe atheromatous disease seen throughout the basilar artery with associated severe multifocal stenoses about its proximal and mid aspect. Superior cerebellar arteries patent bilaterally. Both PCAs primarily supplied via the basilar. Extensive atheromatous disease seen throughout the left PCA with associated severe multifocal stenoses. More mild-to-moderate atheromatous disease noted within the right PCA. Both PCAs remain perfused to their distal aspects. Venous sinuses: Patent allowing for timing the contrast bolus. Anatomic variants: None significant.  No aneurysm. Review of the MIP images confirms the above findings IMPRESSION: 1. Negative CTA for large vessel occlusion or other emergent finding. 2. Atheromatous plaque about the right greater than left carotid bifurcations with associated stenosis of up to 50% on the right and 30% on the left. 3. 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 Additional moderate to severe stenoses involving both carotid siphons. Electronically Signed   By: Rise MuBenjamin  McClintock M.D.   On: 03/08/2022 22:47  ? ?CT HEAD WO CONTRAST ? ?Result Date: 03/08/2022 ?CLINICAL DATA:  Neuro deficit, acute, stroke suspected.  Right arm weakness, dysarthria EXAM: CT HEAD WITHOUT CONTRAST TECHNIQUE: Contiguous axial images were obtained from the base of the skull through the vertex without intravenous contrast. RADIATION DOSE REDUCTION: This exam was  performed according to the departmental dose-optimization program which includes automated exposure control, adjustment of the mA and/or kV according to patient size and/or use of iterative reconstruction technique. COM

## 2022-03-11 NOTE — Progress Notes (Signed)
*  PRELIMINARY RESULTS* ?Echocardiogram ?Echocardiogram Transesophageal has been performed. ? ?James Lambert, [name] ?03/11/2022, 12:08 PM ?

## 2022-03-11 NOTE — Discharge Summary (Signed)
? ?Physician Discharge Summary ? ? ?James Lambert  male DOB: 11/06/1966  ?[id number] ? ?PCP: Pcp, No ? ?Admit date: 03/08/2022 ?Discharge date: 03/11/2022 ? ?Admitted From: home ?Disposition:  home ?Home Health: Yes ?CODE STATUS: Full code ? ?Discharge Instructions   ? ? Discharge instructions   Complete by: As directed ?  ? You have strokes.  Please take aspirin 81 mg and plavix together for 90 days, and after 90 days, just aspirin 81 mg daily alone.  Also started you on cholesterol medication Lipitor. ? ?Your blood pressure has been elevated.  You are prescribed amlodipine and Toprol.  Please see PCP for further adjustment of blood pressure medication. ? ?Please follow up with cardiology for placement of loop recorder for monitoring of heart arrhythmia.   ? ? ?James Lambert ?- ?-  ? ?  ? ?Hospital Course:  ?For full details, please see H&P, progress notes, consult notes and ancillary notes.  ?Briefly,  ?James Lambert is a 56 y.o. male with medical history uncontrolled HTN, asthma, tobacco abuse who presented with chief complaint of right arm weakness and trouble "getting his words out".  ? ?Per patient he started to notice weakness in his RUE around 3 days ago. He became concerned when weakness progressed and on the day of presentation he was started to experience speech difficulties.  ?  ?Acute CVA  ?--MRI brain showed "Patchy multifocal acute ischemic infarcts involving the cortical and subcortical left frontal, parietal, and occipital lobes, within additional ischemic infarcts involving the left greater than right thalami and right cerebellum." ?--Neuro consulted, high suspicion for embolic stroke. ?--Echo showed no thrombus.  TEE also showed no thrombus.  Continuous tele monitoring did not capture Afib. ?--pt is discharged on ASA 81 and plavix together for 90 days, and then monotherapy with ASA 81 mg daily ?--Pt was started on Lipitor ?--Pt will follow up with cardiology as outpatient for placement of loop  recorder. ?  ?Uncontrolled BP  ?--increased home amlodipine to 10 mg daily.  Added Toprol.  Follow up with PCP for further adjustment. ?  ?ETOH ?--No signs of withdrawal ?  ?Asthma COPD ?-stable.  not on controller medications  ?  ?Tobacco abuse  ?-discussed need for cessation  ? ? ?Discharge Diagnoses:  ?Principal Problem: ?  Acute CVA (cerebrovascular accident) (HCC) ? ? ?30 Day Unplanned Readmission Risk Score   ? ?Flowsheet Row ED to Hosp-Admission (Current) from 03/08/2022 in [facility]  ?30 Day Unplanned Readmission Risk Score (%) 12.48 Filed at 03/11/2022 1200  ? ?  ? ? This score is the patient's risk of an unplanned readmission within 30 days of being discharged (0 -100%). The score is based on dignosis, age, lab data, medications, orders, and past utilization.   ?Low:  0-14.9   Medium: 15-21.9   High: 22-29.9   Extreme: 30 and above ? ?  ? ?  ? ? ?Discharge Instructions: ? ?Allergies as of 03/11/2022   ?No Known Allergies ?  ? ?  ?Medication List  ?  ? ?TAKE these medications   ? ?amLODipine 10 MG tablet ?Commonly known as: NORVASC ?Take 1 tablet (10 mg total) by mouth daily. ?What changed:  ?medication strength ?how much to take ?  ?aspirin 81 MG EC tablet ?Take 1 tablet (81 mg total) by mouth daily. Swallow whole. ?Start taking on: March 12, 2022 ?  ?atorvastatin 80 MG tablet ?Commonly known as: LIPITOR ?Take 1 tablet (80 mg total) by mouth daily. ?  Start taking on: March 12, 2022 ?  ?clopidogrel 75 MG tablet ?Commonly known as: PLAVIX ?Take 1 tablet (75 mg total) by mouth daily. ?Start taking on: March 12, 2022 ?  ?metoprolol succinate 25 MG 24 hr tablet ?Commonly known as: TOPROL-XL ?Take 1 tablet (25 mg total) by mouth daily. ?  ?ProAir HFA 108 (90 Base) MCG/ACT inhaler ?Generic drug: albuterol ?Inhale 2 puffs into the lungs once every 6 (six) hours as needed for wheezing or shortness of breath. ?  ? ?  ? ? ? Follow-up Information   ? ? [facility] Go on 04/21/2022.    ?Specialty: General Practice ?Why: [facility] for PCP apt with Dr. Wonda HornerBrenda Lambert on Monday May 15th at 1:20 pm. ?Contact information: ?[address] ?[place] [address] ?[phone] ? ? ?  ?  ? ? James Lambert, James Lambert, James Lambert Follow up.   ?Specialty: Cardiology ?Contact information: ?[address] ?[facility] ?[place] [address] ?[phone] ? ? ?  ?  ? ?  ?  ? ?  ? ? ?No Known Allergies ? ? ?The results of significant diagnostics from this hospitalization (including imaging, microbiology, ancillary and laboratory) are listed below for reference.  ? ?Consultations: ? ? ?Procedures/Studies: ?CT ANGIO HEAD NECK W WO CM ? ?Result Date: 03/08/2022 ?CLINICAL DATA:  Initial evaluation for acute stroke. EXAM: CT ANGIOGRAPHY HEAD AND NECK TECHNIQUE: Multidetector CT imaging of the head and neck was performed using the standard protocol during bolus administration of intravenous contrast. Multiplanar CT image reconstructions and MIPs were obtained to evaluate the vascular anatomy. Carotid stenosis measurements (when applicable) are obtained utilizing NASCET criteria, using the distal internal carotid diameter as the denominator. RADIATION DOSE REDUCTION: This exam was performed according to the departmental dose-optimization program which includes automated exposure control, adjustment of the mA and/or kV according to patient size and/or use of iterative reconstruction technique. CONTRAST:  75mL OMNIPAQUE IOHEXOL 350 MG/ML SOLN COMPARISON:  Prior CT and MRI from earlier the same day. FINDINGS: CTA NECK FINDINGS Aortic arch: Visualized aortic arch normal caliber with normal branch pattern. Mild atheromatous change about the arch itself. No hemodynamically significant stenosis about the origin the great vessels. Right carotid system: Right CCA patent from its origin to the bifurcation without stenosis. Bulky calcified plaque about the right carotid bulb/proximal right  ICA with associated stenosis of up to approximate 50% by NASCET criteria. Right ICA patent distally without stenosis or dissection. Left carotid system: Left CCA patent from its origin to the bifurcation without stenosis. Scattered calcified plaque about the left carotid bulb/proximal left ICA with no more than mild 30% stenosis by NASCET criteria. Left ICA patent distally without stenosis or dissection. Vertebral arteries: Both vertebral arteries arise from subclavian arteries. No proximal subclavian artery stenosis. Vertebral arteries patent without significant stenosis or dissection. Skeleton: No discrete or worrisome osseous lesions. Mild cervical spondylosis without significant spinal stenosis. Other neck: No other acute soft tissue abnormality within the neck. Upper chest: Visualized upper chest demonstrates no acute finding. Review of the MIP images confirms the above findings CTA HEAD FINDINGS Anterior circulation: Petrous segments patent without significant stenosis. Extensive atheromatous disease throughout the carotid siphons with associated moderate to severe multifocal stenoses bilaterally. A1 segments patent. Normal anterior communicating artery complex. Anterior cerebral arteries patent without stenosis. No significant M1 stenosis. No proximal MCA branch occlusion. Distal MCA branches perfused and symmetric. Diffuse small vessel atheromatous irregularity. Posterior circulation: Extensive atheromatous change within the  V4 segments bilaterally with associated moderate to severe stenoses on the right, and more mild-to-moderate stenoses on the left. Severe atheromatous disease seen throughout the basilar artery with associated severe multifocal stenoses about its proximal and mid aspect. Superior cerebellar arteries patent bilaterally. Both PCAs primarily supplied via the basilar. Extensive atheromatous disease seen throughout the left PCA with associated severe multifocal stenoses. More mild-to-moderate  atheromatous disease noted within the right PCA. Both PCAs remain perfused to their distal aspects. Venous sinuses: Patent allowing for timing the contrast bolus. Anatomic variants: None significant.  N

## 2022-03-11 NOTE — CV Procedure (Addendum)
Transesophageal echocardiogram preliminary report ? ?James Lambert ?[id number] ?1966-01-30 ? ?Preliminary diagnosis ? Stroke with possible embolic source ? ?Postprocedural diagnosis ?Normal LV systolic function with normal cardiac valves and mild mitral regurgitation ?With patent foramen ovale with minimal shunting ?Time out ?A timeout was performed by the nursing staff and physicians specifically identifying the procedure performed, identification of the patient, the type of sedation, all allergies and medications, all pertinent medical history, and presedation assessment of nasopharynx. ?The patient and or family understand the risks of the procedure including the rare risks of death, stroke, heart attack, esophogeal perforation, sore throat, and reaction to medications given. ? ?Moderate sedation ?During this procedure the patient has received Versed 4 milligrams and fentanyl 100 micrograms to achieve appropriate moderate sedation.  The patient had continued monitoring of heart rate, oxygenation, blood pressure, respiratory rate, and extent of signs of sedation throughout the entire procedure.  The patient received this moderate sedation over a period of 14 minutes.  Both the nursing staff and I were present during the procedure when the patient had moderate sedation for 100% of the time. ? ?Treatment considerations ?Consideration aspirin for further risk reduction of concerns for patent foramen O. Val with minimal shunting.  Otherwise no anticoagulation at this time due to no evidence of thrombus and/or other primary cause of stroke ?And will consider monitor as next step ? ?For further details of transesophageal echocardiogram please refer to final report. ? ?Signed,  ?Lamar Blinks M.D. FACC ?03/11/2022 11:47 AM ? ?

## 2022-03-11 NOTE — Progress Notes (Signed)
PT Cancellation Note ? ?Patient Details ?Name: James Lambert ?MRN: [id number] ?DOB: [birth year] ? ? ?Cancelled Treatment:     Pt off floor at procedure. Will continue PT per POC next available date/time. ? ? ?Josie Dixon ?03/11/2022, 12:12 PM ?

## 2022-03-12 ENCOUNTER — Encounter: Payer: Self-pay | Admitting: Internal Medicine

## 2022-03-18 ENCOUNTER — Inpatient Hospital Stay
Admission: EM | Admit: 2022-03-18 | Discharge: 2022-03-21 | DRG: 065 | Disposition: A | Discharge status: Home Health | Payer: Medicaid Other | Attending: Internal Medicine | Admitting: Internal Medicine

## 2022-03-18 ENCOUNTER — Encounter: Payer: Self-pay | Admitting: Internal Medicine

## 2022-03-18 ENCOUNTER — Emergency Department: Payer: Medicaid Other

## 2022-03-18 ENCOUNTER — Other Ambulatory Visit: Payer: Self-pay

## 2022-03-18 DIAGNOSIS — L03317 Cellulitis of buttock: Principal | ICD-10-CM | POA: Diagnosis present

## 2022-03-18 DIAGNOSIS — Z7902 Long term (current) use of antithrombotics/antiplatelets: Secondary | ICD-10-CM

## 2022-03-18 DIAGNOSIS — Z597 Insufficient social insurance and welfare support: Secondary | ICD-10-CM

## 2022-03-18 DIAGNOSIS — I639 Cerebral infarction, unspecified: Secondary | ICD-10-CM | POA: Diagnosis not present

## 2022-03-18 DIAGNOSIS — M1712 Unilateral primary osteoarthritis, left knee: Secondary | ICD-10-CM | POA: Diagnosis present

## 2022-03-18 DIAGNOSIS — M25561 Pain in right knee: Secondary | ICD-10-CM | POA: Diagnosis present

## 2022-03-18 DIAGNOSIS — Z8673 Personal history of transient ischemic attack (TIA), and cerebral infarction without residual deficits: Secondary | ICD-10-CM | POA: Diagnosis present

## 2022-03-18 DIAGNOSIS — J449 Chronic obstructive pulmonary disease, unspecified: Secondary | ICD-10-CM | POA: Diagnosis present

## 2022-03-18 DIAGNOSIS — F17200 Nicotine dependence, unspecified, uncomplicated: Secondary | ICD-10-CM | POA: Diagnosis present

## 2022-03-18 DIAGNOSIS — Q2112 Patent foramen ovale: Secondary | ICD-10-CM

## 2022-03-18 DIAGNOSIS — M25562 Pain in left knee: Secondary | ICD-10-CM | POA: Diagnosis present

## 2022-03-18 DIAGNOSIS — I63412 Cerebral infarction due to embolism of left middle cerebral artery: Principal | ICD-10-CM | POA: Diagnosis present

## 2022-03-18 DIAGNOSIS — F17210 Nicotine dependence, cigarettes, uncomplicated: Secondary | ICD-10-CM | POA: Diagnosis present

## 2022-03-18 DIAGNOSIS — G8191 Hemiplegia, unspecified affecting right dominant side: Secondary | ICD-10-CM | POA: Diagnosis present

## 2022-03-18 DIAGNOSIS — Z79899 Other long term (current) drug therapy: Secondary | ICD-10-CM

## 2022-03-18 DIAGNOSIS — R29700 NIHSS score 0: Secondary | ICD-10-CM | POA: Diagnosis present

## 2022-03-18 DIAGNOSIS — Z7982 Long term (current) use of aspirin: Secondary | ICD-10-CM

## 2022-03-18 DIAGNOSIS — I10 Essential (primary) hypertension: Secondary | ICD-10-CM | POA: Diagnosis present

## 2022-03-18 DIAGNOSIS — S83282A Other tear of lateral meniscus, current injury, left knee, initial encounter: Secondary | ICD-10-CM | POA: Diagnosis present

## 2022-03-18 DIAGNOSIS — M659 Synovitis and tenosynovitis, unspecified: Secondary | ICD-10-CM | POA: Diagnosis present

## 2022-03-18 DIAGNOSIS — G8929 Other chronic pain: Secondary | ICD-10-CM | POA: Diagnosis present

## 2022-03-18 DIAGNOSIS — J432 Centrilobular emphysema: Secondary | ICD-10-CM | POA: Diagnosis present

## 2022-03-18 DIAGNOSIS — L732 Hidradenitis suppurativa: Secondary | ICD-10-CM | POA: Diagnosis present

## 2022-03-18 DIAGNOSIS — S83289A Other tear of lateral meniscus, current injury, unspecified knee, initial encounter: Secondary | ICD-10-CM

## 2022-03-18 DIAGNOSIS — L0292 Furuncle, unspecified: Secondary | ICD-10-CM | POA: Diagnosis present

## 2022-03-18 DIAGNOSIS — Z91199 Patient's noncompliance with other medical treatment and regimen due to unspecified reason: Secondary | ICD-10-CM

## 2022-03-18 DIAGNOSIS — M25462 Effusion, left knee: Secondary | ICD-10-CM

## 2022-03-18 LAB — BASIC METABOLIC PANEL
Anion gap: 11 (ref 5–15)
BUN: 12 mg/dL (ref 6–20)
CO2: 21 mmol/L — ABNORMAL LOW (ref 22–32)
Calcium: 9.1 mg/dL (ref 8.9–10.3)
Chloride: 100 mmol/L (ref 98–111)
Creatinine, Ser: 1.09 mg/dL (ref 0.61–1.24)
GFR, Estimated: >60 mL/min (ref >60)
Glucose, Bld: 81 mg/dL (ref 70–99)
Potassium: 4.2 mmol/L (ref 3.5–5.1)
Sodium: 132 mmol/L — ABNORMAL LOW (ref 135–145)

## 2022-03-18 LAB — CBC
HCT: 39.2 % (ref 39.0–52.0)
Hemoglobin: 12.7 g/dL — ABNORMAL LOW (ref 13.0–17.0)
MCH: 30.5 pg (ref 26.0–34.0)
MCHC: 32.4 g/dL (ref 30.0–36.0)
MCV: 94 fL (ref 80.0–100.0)
Platelets: 411 10*3/uL — ABNORMAL HIGH (ref 150–400)
RBC: 4.17 MIL/uL — ABNORMAL LOW (ref 4.22–5.81)
RDW: 13.3 % (ref 11.5–15.5)
WBC: 9.2 10*3/uL (ref 4.0–10.5)
nRBC: 0 % (ref 0.0–0.2)

## 2022-03-18 LAB — URINALYSIS, ROUTINE W REFLEX MICROSCOPIC
Bilirubin Urine: NEGATIVE
Glucose, UA: NEGATIVE mg/dL
Ketones, ur: 20 mg/dL — AB
Leukocytes,Ua: NEGATIVE
Nitrite: NEGATIVE
Protein, ur: NEGATIVE mg/dL
Specific Gravity, Urine: 1.021 (ref 1.005–1.030)
pH: 5 (ref 5.0–8.0)

## 2022-03-18 IMAGING — MR MR HEAD W/O CM
13 series · 48 of 48 positions shown · non-contrast
Comparison: [DATE]

CLINICAL DATA: Stroke, follow up

EXAM:
MRI HEAD WITHOUT CONTRAST
TECHNIQUE: Multiplanar, multiecho pulse sequences of the brain and surrounding
structures were obtained without intravenous contrast.

[Series 5: ax dwi_tracew · axial · 3.0mm · 0.71mm/px · z∈[-90,+74]mm · 3 of 56 slices shown]
[im 1/56]
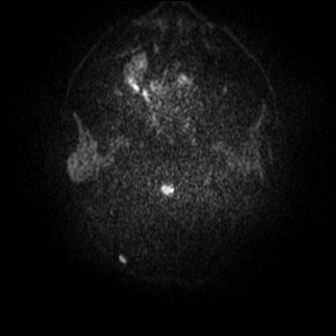
[im 28/56]
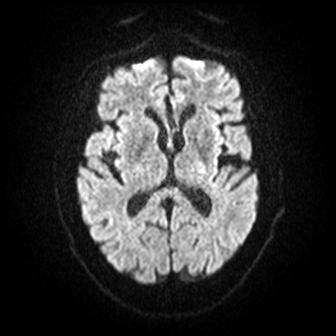
[im 56/56]
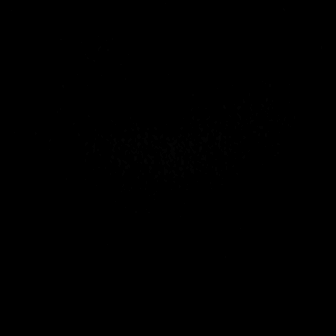

[Series 6: ax dwi_adc · axial · 3.0mm · 0.71mm/px · z∈[-90,+71]mm · 3 of 55 slices shown]
[im 1/55]
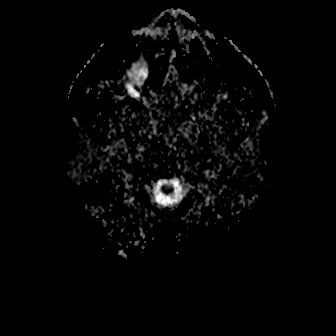
[im 28/55]
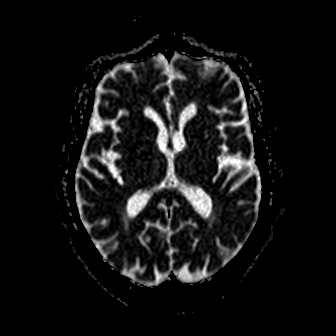
[im 55/55]
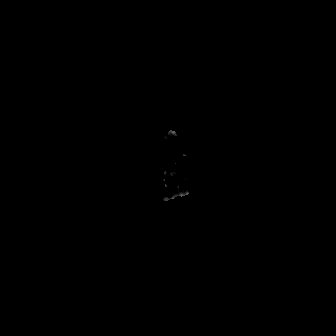

[Series 7: cor dwi_tracew · coronal · 5.0mm · 0.68mm/px · 2 of 40 slices shown]
[im 1/40]
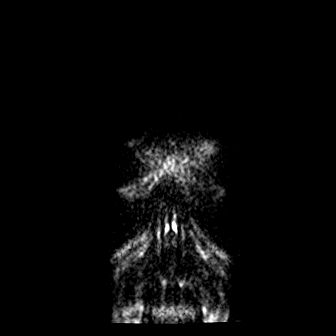
[im 40/40]
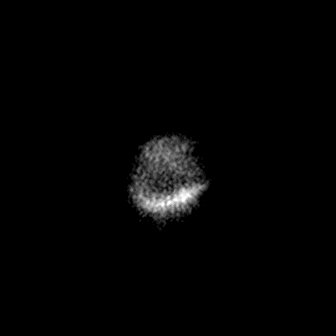

[Series 8: cor dwi_adc · coronal · 5.0mm · 0.68mm/px · 3 of 40 slices shown]
[im 1/40]
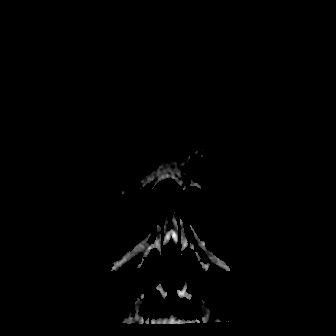
[im 20/40]
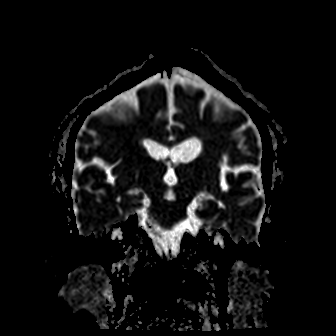
[im 40/40]
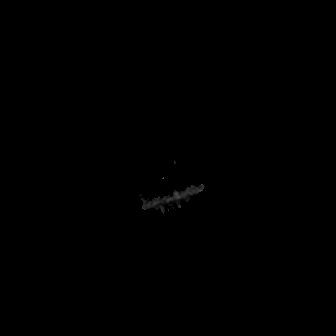

[Series 9: T1 · sagittal · 5.0mm · 0.47mm/px · 2 of 24 slices shown (1 of 2)]
[im 1/24]
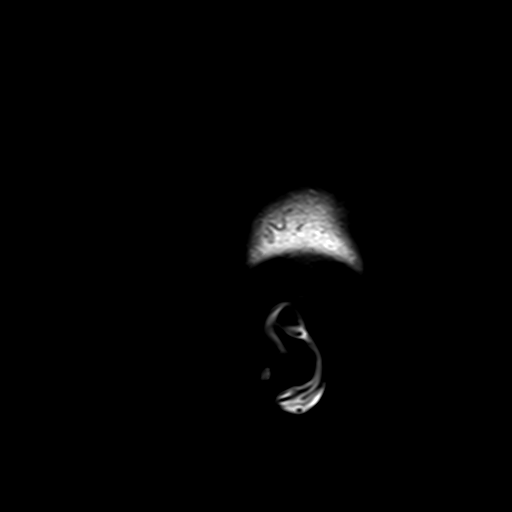
[im 24/24]
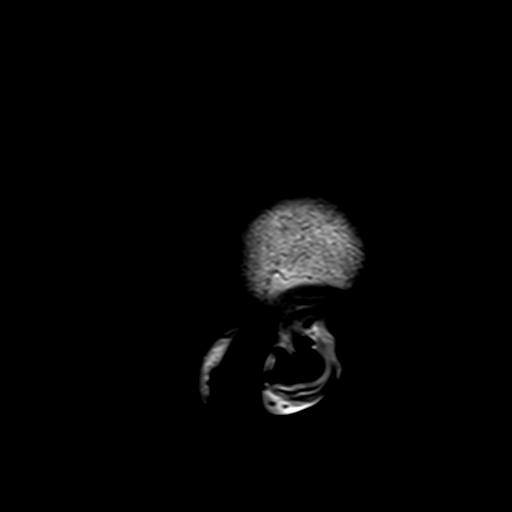

[Series 10: T2 · axial · 5.0mm · 0.86mm/px · z∈[-81,+63]mm · 2 of 25 slices shown (1 of 2)]
[im 1/25]
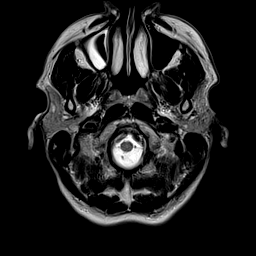
[im 25/25]
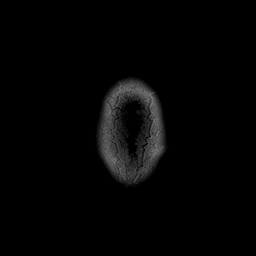

[Series 11: ax swi_mag · axial · 3.0mm · 0.90mm/px · z∈[-91,+73]mm · 4 of 56 slices shown]
[im 1/56]
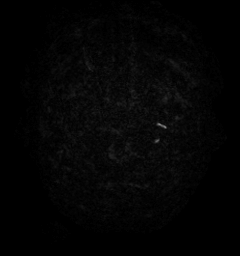
[im 19/56]
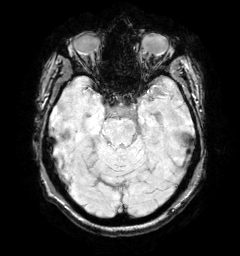
[im 37/56]
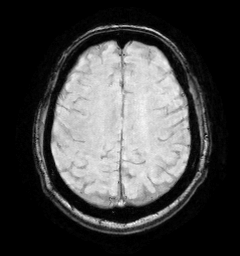
[im 56/56]
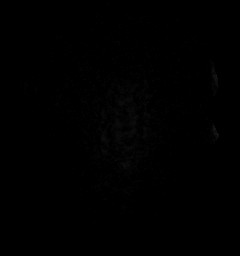

[Series 12: ax swi_pha · axial · 3.0mm · 0.90mm/px · z∈[-91,+73]mm · 4 of 56 slices shown]
[im 1/56]
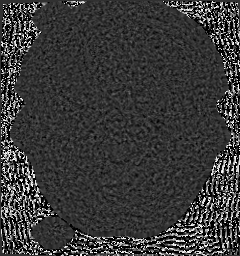
[im 19/56]
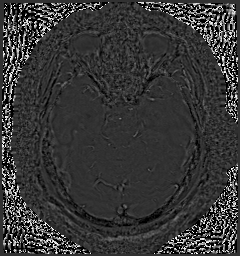
[im 37/56]
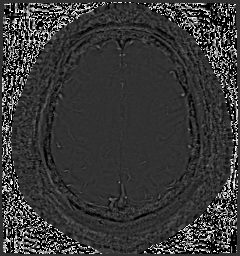
[im 56/56]
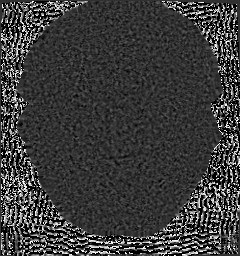

[Series 13: ax swi_swi · axial · 3.0mm · 0.90mm/px · z∈[-91,+73]mm · 4 of 56 slices shown]
[im 1/56]
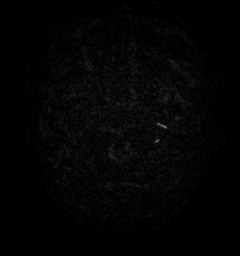
[im 19/56]
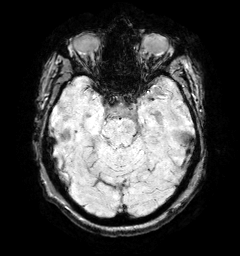
[im 37/56]
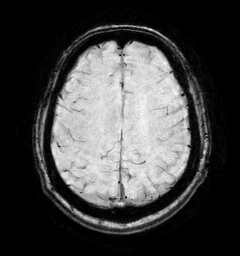
[im 56/56]
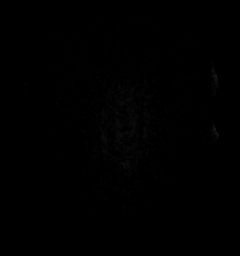

[Series 14: ax swi_swi_mip · axial · 24.0mm · 0.90mm/px · z∈[-81,+63]mm · 3 of 49 slices shown]
[im 1/49]
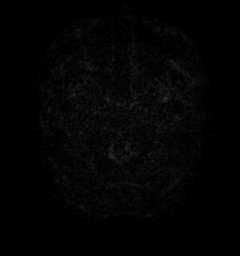
[im 25/49]
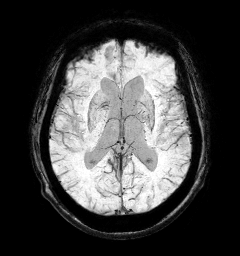
[im 49/49]
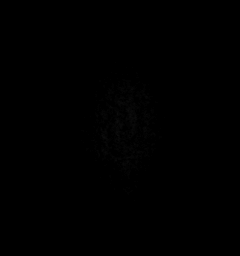

[Series 15: FLAIR · axial · 3.0mm · 0.69mm/px · z∈[-90,+72]mm · 4 of 55 slices shown]
[im 1/55]
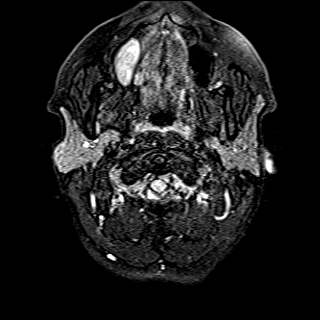
[im 19/55]
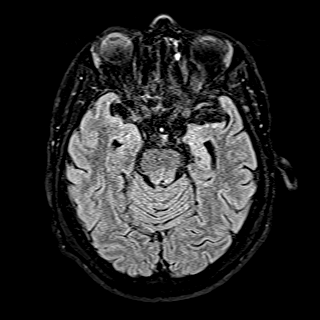
[im 37/55]
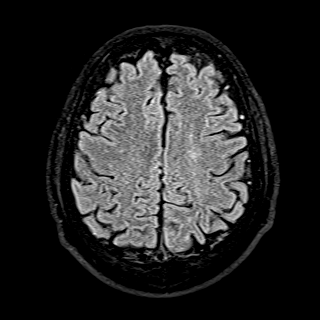
[im 55/55]
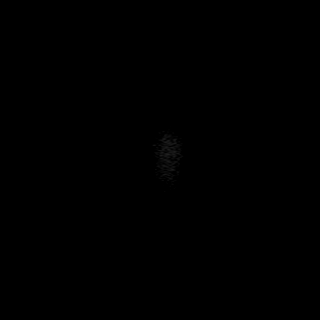

[Series 16: T1 · axial · 1.0mm · 0.98mm/px · z∈[-96,+77]mm · 12 of 175 slices shown (2 of 2)]
[im 1/175]
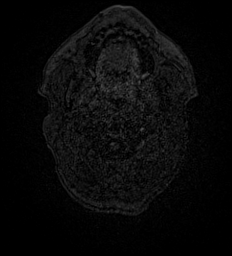
[im 16/175]
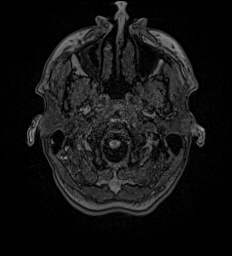
[im 32/175]
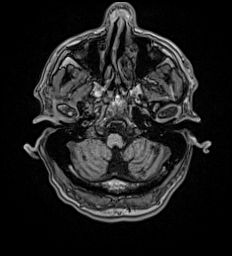
[im 48/175]
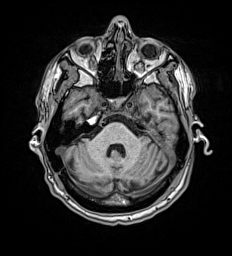
[im 64/175]
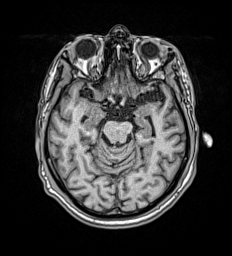
[im 80/175]
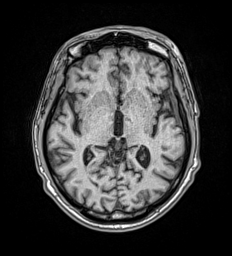
[im 95/175]
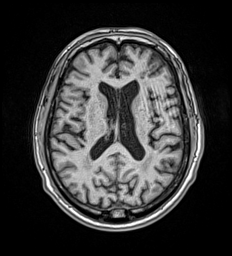
[im 111/175]
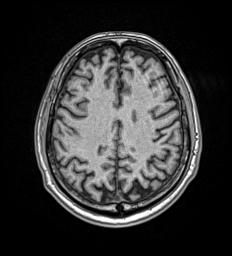
[im 127/175]
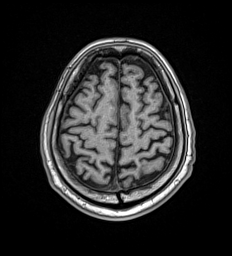
[im 143/175]
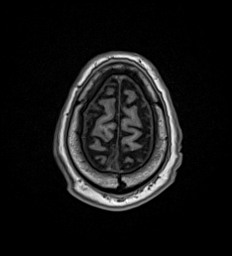
[im 159/175]
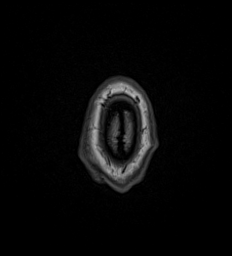
[im 175/175]
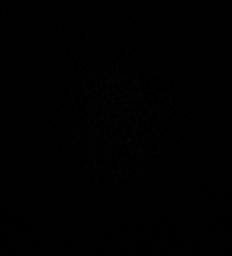

[Series 17: T2 · coronal · 5.0mm · 0.86mm/px · 2 of 30 slices shown (2 of 2)]
[im 1/30]
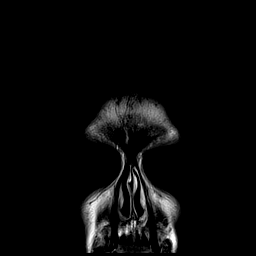
[im 30/30]
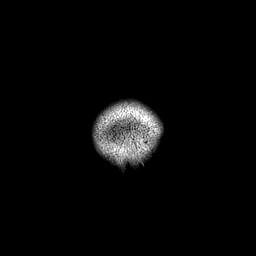

[48 of 48 positions shown; findings below may reference images not displayed]

FINDINGS: Brain: New small focus of reduced diffusion in the left subinsular
white matter. Expected evolution of infarcts seen on the prior
study, which remain visible on T2 imaging. No intracranial
hemorrhage. Stable findings of probable chronic microvascular
ischemic changes in the cerebral white matter. Small bilateral
chronic cerebellar infarcts. Small chronic left basal ganglia
infarct. Ventricles are stable in size.

Vascular: Major vessel flow voids at the skull base are preserved.

Skull and upper cervical spine: Normal marrow signal is preserved.

Sinuses/Orbits: Paranasal sinuses are aerated. Orbits are
unremarkable.

Other: Sella is unremarkable.  Mastoid air cells are clear.
IMPRESSION: New small acute infarct of the left subinsular white matter.

Expected evolution of infarcts on the recent prior study. Otherwise
no change.

## 2022-03-18 IMAGING — CT CT HEAD W/O CM
4 series · 17 of 47 positions shown, 19 images · non-contrast
Comparison: Previous studies including the CT done on [DATE]

CLINICAL DATA: Numbness right side, paresthesia



[Series 2: head bone · axial · 0.44mm/px · z∈[-44,+12]mm · 4 of 81 slices shown]
[im 9/81  bone]
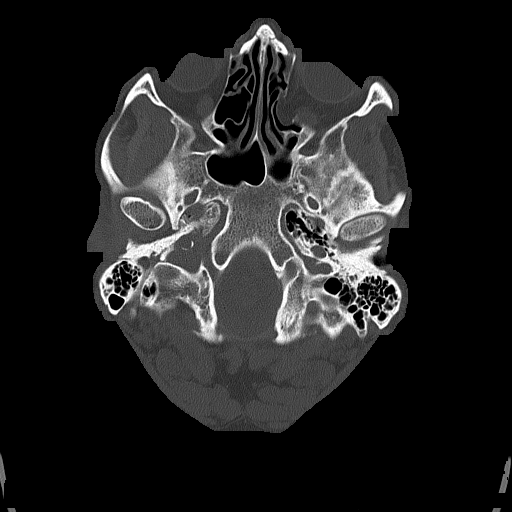
[im 17/81  bone]
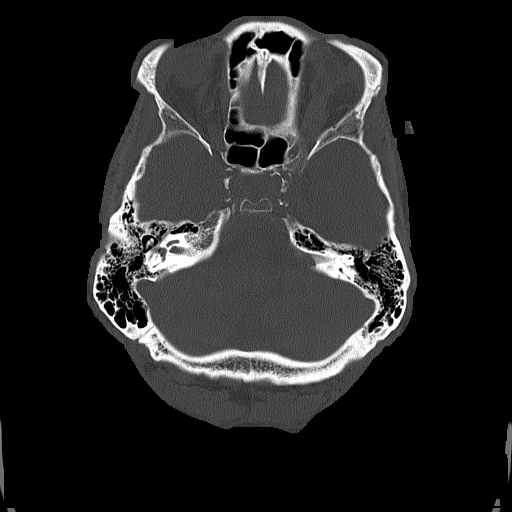
[im 25/81  bone]
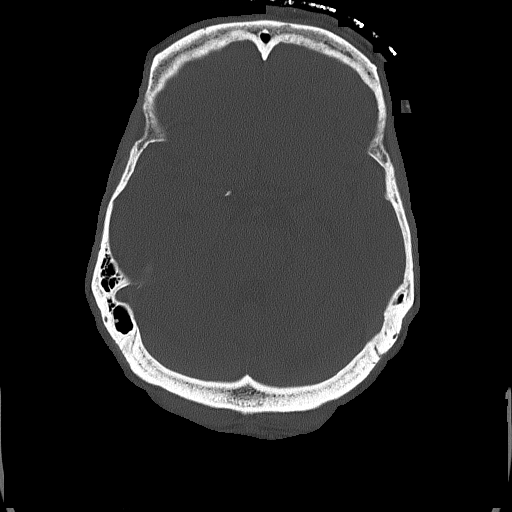
[im 37/81  bone]
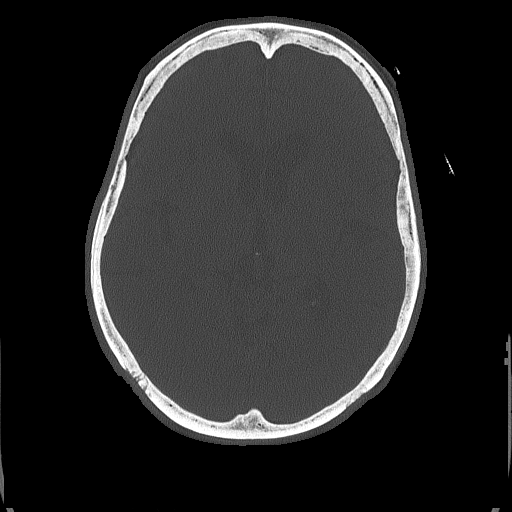

[Series 3: head wo · axial · 0.44mm/px · z∈[-40,+80]mm · 7 of 33 slices shown, 9 images]
[im 5/33  brain]
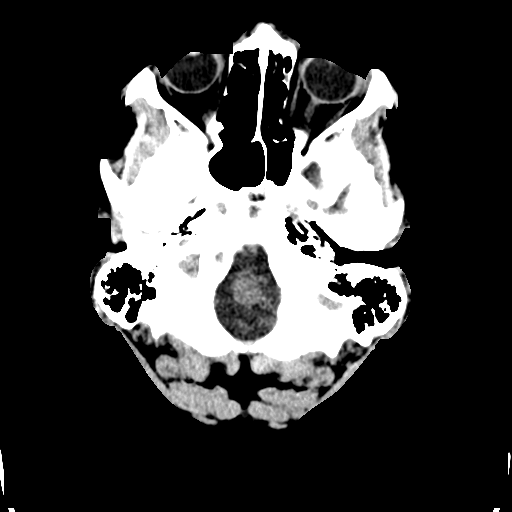
[im 5/33  bone]
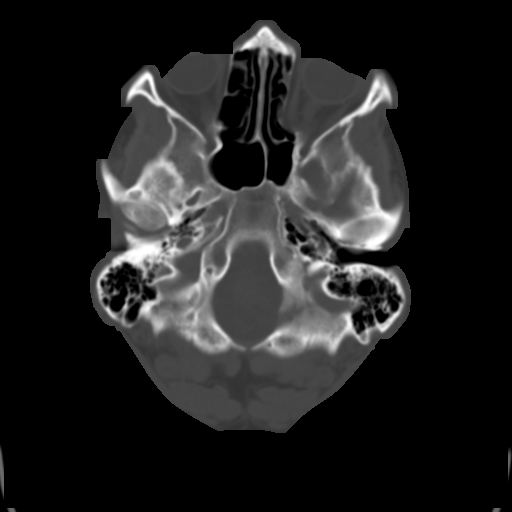
[im 9/33  brain]
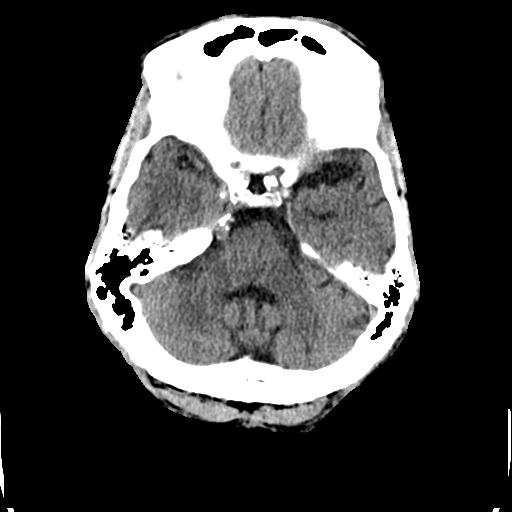
[im 13/33  brain]
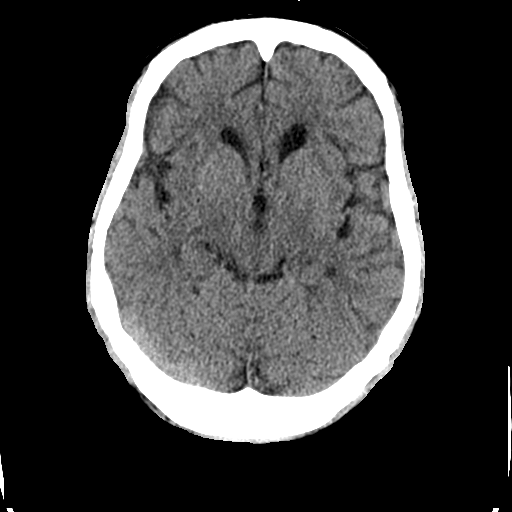
[im 17/33  brain]
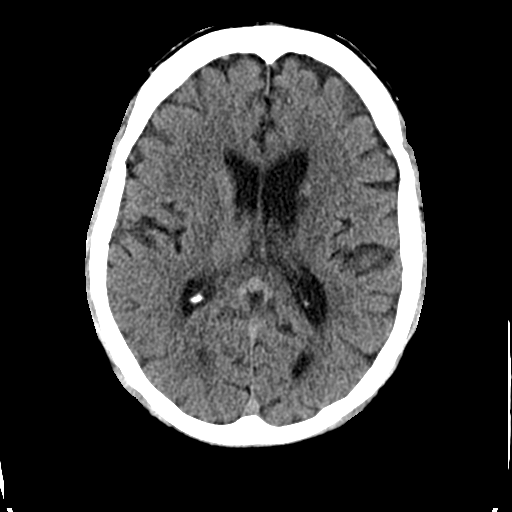
[im 21/33  brain]
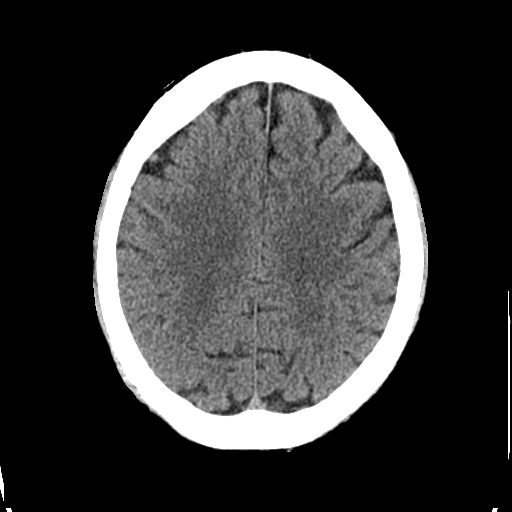
[im 21/33  bone]
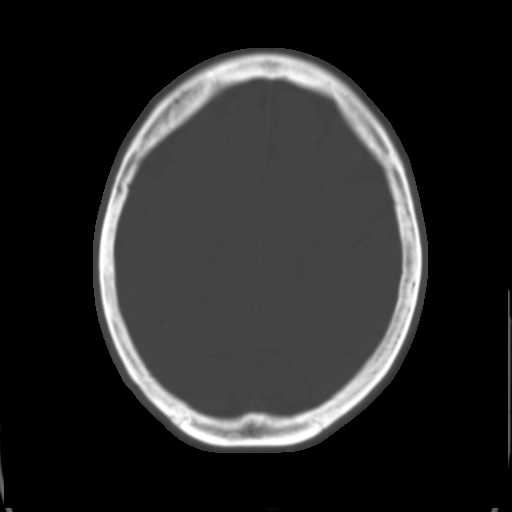
[im 25/33  brain]
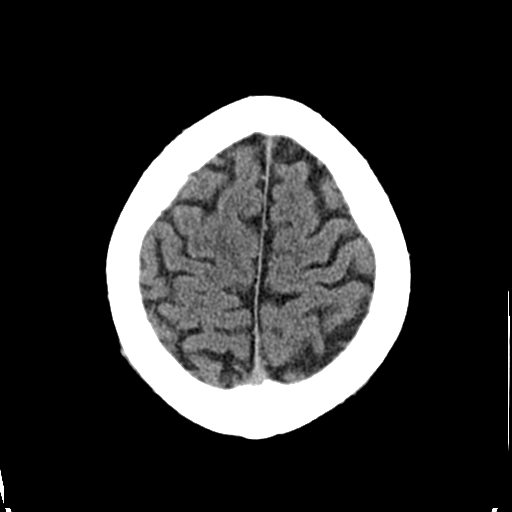
[im 29/33  brain]
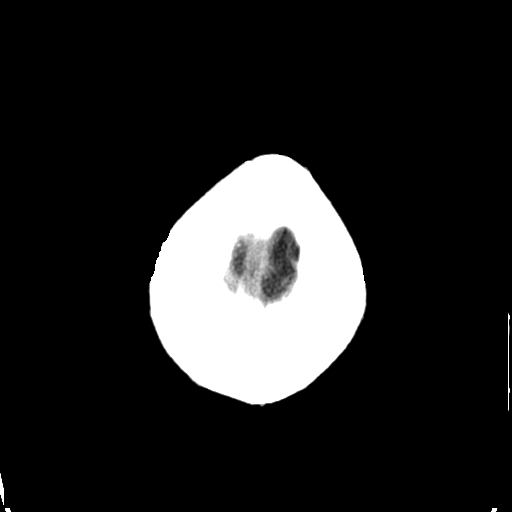

[Series 4: coronal soft tissue · coronal · 0.33mm/px · 3 of 68 slices shown]
[im 23/68  brain]
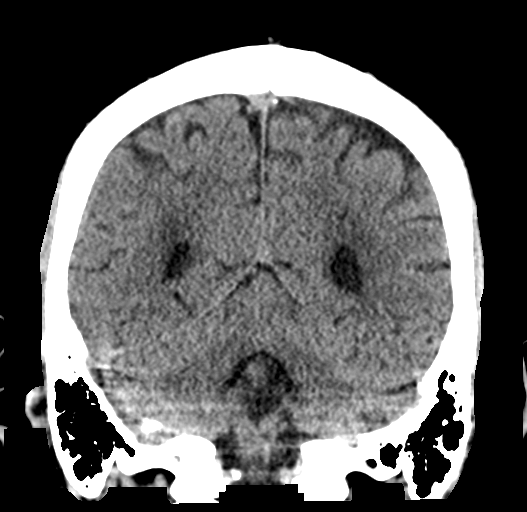
[im 30/68  brain]
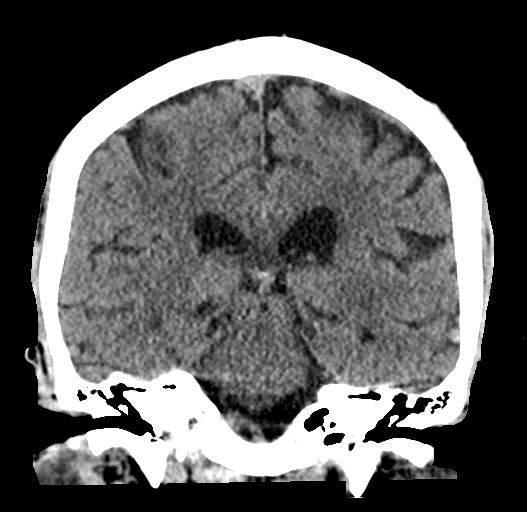
[im 38/68  brain]
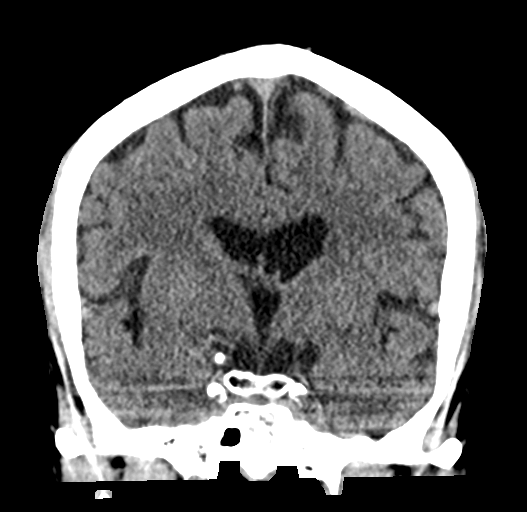

[Series 5: sagittal soft tissue · sagittal · 0.33mm/px · 3 of 59 slices shown]
[im 20/59  brain]
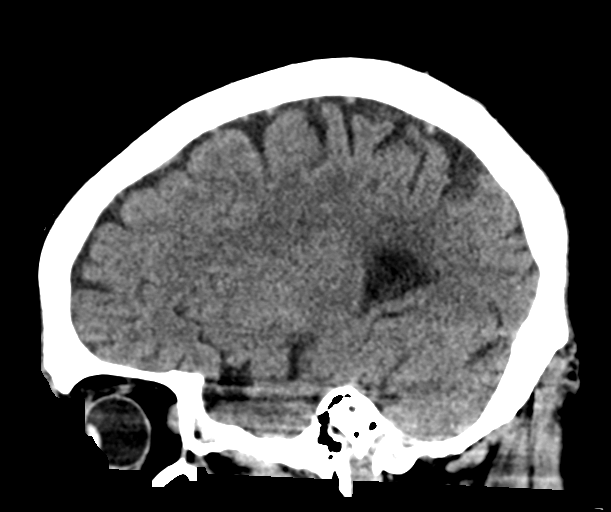
[im 30/59  brain]
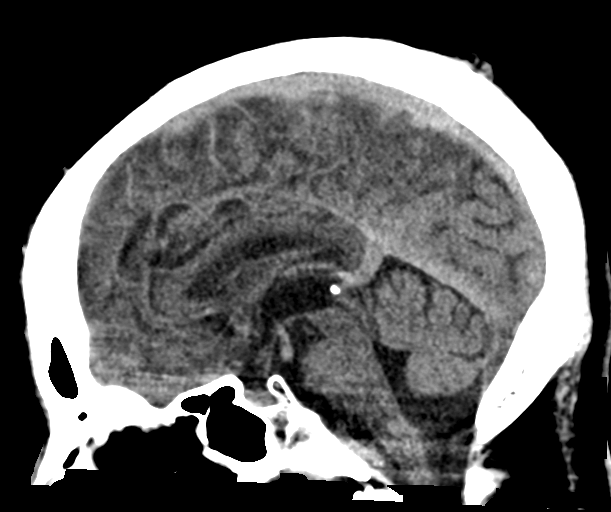
[im 39/59  brain]
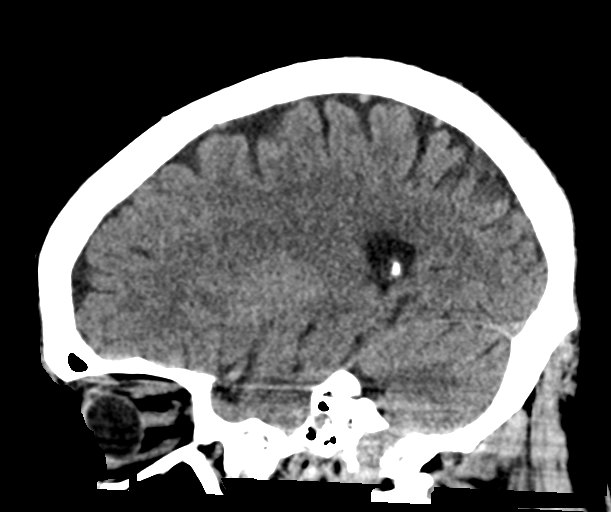

[17 of 47 positions shown; findings below may reference images not displayed]

FINDINGS: Brain: No acute intracranial findings are seen. There are no signs
of bleeding. Cortical sulci are prominent. There is decreased
density in the posterior left cerebellum, possibly old infarct.
Decreased density seen in the left thalamus in the previous study is
less evident in the current study.

Vascular: Arterial calcifications are seen.

Skull: Unremarkable.

Sinuses/Orbits: There is old blowout fracture in the medial wall of
left orbit.

Other: None
IMPRESSION: No acute intracranial findings are seen in noncontrast CT brain.

## 2022-03-18 MED ORDER — VANCOMYCIN HCL 1250 MG/250ML IV SOLN
1250.0000 mg | Freq: Two times a day (BID) | INTRAVENOUS | Status: DC
  Administered 2022-03-19: 1250 mg via INTRAVENOUS
  Filled 2022-03-18 (×2): qty 250

## 2022-03-18 MED ORDER — STROKE: EARLY STAGES OF RECOVERY BOOK: Freq: Once | Status: DC

## 2022-03-18 MED ORDER — ALBUTEROL SULFATE (2.5 MG/3ML) 0.083% IN NEBU: 2.5000 mg | INHALATION_SOLUTION | Freq: Four times a day (QID) | RESPIRATORY_TRACT | Status: DC | PRN

## 2022-03-18 MED ORDER — CHLORHEXIDINE GLUCONATE CLOTH 2 % EX PADS
6.0000 | MEDICATED_PAD | Freq: Every day | CUTANEOUS | Status: DC
  Administered 2022-03-18 – 2022-03-21 (×4): 6 via TOPICAL
  Filled 2022-03-18: qty 6

## 2022-03-18 MED ORDER — CLOPIDOGREL BISULFATE 75 MG PO TABS
75.0000 mg | ORAL_TABLET | Freq: Every day | ORAL | Status: DC
  Administered 2022-03-19 – 2022-03-21 (×3): 75 mg via ORAL
  Filled 2022-03-18 (×3): qty 1

## 2022-03-18 MED ORDER — LABETALOL HCL 5 MG/ML IV SOLN: 10.0000 mg | Freq: Four times a day (QID) | INTRAVENOUS | Status: DC | PRN

## 2022-03-18 MED ORDER — ACETAMINOPHEN 160 MG/5ML PO SOLN
650.0000 mg | ORAL | Status: DC | PRN
  Filled 2022-03-18: qty 20.3

## 2022-03-18 MED ORDER — METOPROLOL SUCCINATE ER 25 MG PO TB24
25.0000 mg | ORAL_TABLET | Freq: Every day | ORAL | Status: DC
  Administered 2022-03-19 – 2022-03-21 (×3): 25 mg via ORAL
  Filled 2022-03-18 (×3): qty 1

## 2022-03-18 MED ORDER — VANCOMYCIN HCL 1500 MG/300ML IV SOLN
1500.0000 mg | Freq: Once | INTRAVENOUS | Status: AC
  Administered 2022-03-18: 1500 mg via INTRAVENOUS
  Filled 2022-03-18: qty 300

## 2022-03-18 MED ORDER — ASPIRIN EC 81 MG PO TBEC
81.0000 mg | DELAYED_RELEASE_TABLET | Freq: Every day | ORAL | Status: DC
  Administered 2022-03-19 – 2022-03-21 (×3): 81 mg via ORAL
  Filled 2022-03-18 (×3): qty 1

## 2022-03-18 MED ORDER — DOXYCYCLINE HYCLATE 100 MG PO TABS
100.0000 mg | ORAL_TABLET | Freq: Once | ORAL | Status: AC
  Administered 2022-03-18: 100 mg via ORAL
  Filled 2022-03-18: qty 1

## 2022-03-18 MED ORDER — TRAMADOL HCL 50 MG PO TABS
50.0000 mg | ORAL_TABLET | Freq: Four times a day (QID) | ORAL | Status: DC | PRN
  Administered 2022-03-19 – 2022-03-20 (×2): 50 mg via ORAL
  Filled 2022-03-18 (×2): qty 1

## 2022-03-18 MED ORDER — HYDROCODONE-ACETAMINOPHEN 5-325 MG PO TABS
1.0000 | ORAL_TABLET | Freq: Once | ORAL | Status: AC
  Administered 2022-03-18: 1 via ORAL
  Filled 2022-03-18: qty 1

## 2022-03-18 MED ORDER — ASPIRIN EC 325 MG PO TBEC
325.0000 mg | DELAYED_RELEASE_TABLET | Freq: Every day | ORAL | Status: DC
  Administered 2022-03-18: 325 mg via ORAL
  Filled 2022-03-18: qty 1

## 2022-03-18 MED ORDER — ATORVASTATIN CALCIUM 20 MG PO TABS
80.0000 mg | ORAL_TABLET | Freq: Every day | ORAL | Status: DC
  Administered 2022-03-19 – 2022-03-21 (×3): 80 mg via ORAL
  Filled 2022-03-18 (×3): qty 4

## 2022-03-18 MED ORDER — ACETAMINOPHEN 325 MG PO TABS
650.0000 mg | ORAL_TABLET | ORAL | Status: DC | PRN
  Administered 2022-03-18 – 2022-03-19 (×2): 650 mg via ORAL
  Filled 2022-03-18 (×2): qty 2

## 2022-03-18 MED ORDER — ACETAMINOPHEN 650 MG RE SUPP: 650.0000 mg | RECTAL | Status: DC | PRN

## 2022-03-18 MED ORDER — DOXYCYCLINE HYCLATE 100 MG PO TABS: 100.0000 mg | ORAL_TABLET | Freq: Two times a day (BID) | ORAL | Status: DC

## 2022-03-18 MED ORDER — VANCOMYCIN HCL IN DEXTROSE 1-5 GM/200ML-% IV SOLN
1000.0000 mg | Freq: Once | INTRAVENOUS | Status: AC
  Administered 2022-03-18: 1000 mg via INTRAVENOUS
  Filled 2022-03-18: qty 200

## 2022-03-18 MED ORDER — PIPERACILLIN-TAZOBACTAM 3.375 G IVPB
3.3750 g | Freq: Three times a day (TID) | INTRAVENOUS | Status: DC
  Administered 2022-03-18 – 2022-03-19 (×3): 3.375 g via INTRAVENOUS
  Filled 2022-03-18 (×3): qty 50

## 2022-03-18 NOTE — Assessment & Plan Note (Addendum)
-   [name] and has an acute flareup ?- not a surgical candidate per surgery given acute CVA; for now trying to treat medically with abx ?-Evaluated by general surgery and ID, appreciate assistance ?- Antibiotics de-escalated to Augmentin to complete course ?-Patient to follow-up with dermatology per ID recommendations at discharge ?

## 2022-03-18 NOTE — ED Notes (Signed)
Pt given dinner tray, ginger ale and phone.  ?

## 2022-03-18 NOTE — ED Triage Notes (Signed)
Pt here with numbness on the right side that started yesterday. Pt states that he just got out of the hospital a week ago for a stroke. 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 Pt states he had deficits with speech from his prior stroke. Pt states pain with some boils on his buttocks and legs. ?

## 2022-03-18 NOTE — Congregational Nurse Program (Signed)
Pharmacy Antibiotic Note ? ?SENDER RUEB is a 56 y.o. male admitted on 03/18/2022 with acute CVA and acute on chronic worsening of boils with discharge 2/2 hidradenitis suppurativa . Pharmacy has been consulted for Vancomycin and Zosyn dosing. ? ?Plan: ?Zosyn 3.375g IV q8h (4 hour infusion). ?Vancomycin 2500 mg LD dose ( 1000 mg x 2 followed by 1500 mg ) ?Initiate Vancomycin 1250 mg Q12H. Goal AUC 400-550 ?Estimated AUC 542/Cmin: 15.1 ?Scr 1.09, IBW, Vd 0.5   ? ?Height: 6\' 3"  (190.5 cm) ?Weight: 113.4 kg (250 lb) ?IBW/kg (Calculated) : 84.5 ? ?Temp (24hrs), Avg:98.3 ?F (36.8 ?C), Min:98 ?F (36.7 ?C), Max:98.6 ?F (37 ?C) ? ?Recent Labs  ?Lab 03/18/22 ?1142  ?WBC 9.2  ?CREATININE 1.09  ?  ?Estimated Creatinine Clearance: 105.3 mL/min (by C-G formula based on SCr of 1.09 mg/dL).   ? ?No Known Allergies ? ?Antimicrobials this admission: ?4/11 Doxcycline x 1  ?4/11 Zosyn >>  ?4/11 Vancomycin >> ? ?Dose adjustments this admission: ? ? ?Microbiology results: ? ? ?Thank you for allowing pharmacy to be a part of this patient?s care. ? ?6/11, PharmD, BCPS ?Clinical Pharmacist   ?03/18/2022 6:29 PM ? ?

## 2022-03-18 NOTE — ED Provider Notes (Signed)
? ? ?[facility] ?Emergency Department Provider Note ? ? ? ? Event Date/Time  ? First MD Initiated Contact with Patient 03/18/22 1203   ?  (approximate) ? ? ?History  ? ?Numbness ? ? ?HPI ? ?James Lambert is a 56 y.o. male with a history of uncontrolled hypertension, HS, COPD, asthma, tobacco abuse, and CVA, presents to the ED for evaluation of right arm weakness & numbness that he feels began yesterday.  He believes these symptoms are new and different, despite his recent stroke. Patient is approximately a week and a half status post evaluation for a left cerebellar CVA, and subsequent hospital admission. According to review of the EMR, the patient presented initially after 3 days of RUE weakness and speech difficulties. He reports he is compliant with his post-admission medicines, including anti-platelet therapy. ? ? ?Physical Exam  ? ?Triage Vital Signs: ?ED Triage Vitals  ?Enc Vitals Group  ?   BP 03/18/22 1142 (!) 183/124  ?   Pulse Rate 03/18/22 1142 89  ?   Resp 03/18/22 1142 18  ?   Temp 03/18/22 1142 98.6 ?F (37 ?C)  ?   Temp Source 03/18/22 1142 Oral  ?   SpO2 03/18/22 1142 99 %  ?   Weight 03/18/22 1140 250 lb (113.4 kg)  ?   Height 03/18/22 1140 6\' 3"  (1.905 m)  ?   Head Circumference --   ?   Peak Flow --   ?   Pain Score 03/18/22 1140 5  ?   Pain Loc --   ?   Pain Edu? --   ?   Excl. in [place]? --   ? ? ?Most recent vital signs: ?Vitals:  ? 03/19/22 1533 03/19/22 2007  ?BP: (!) 154/107 (!) 157/105  ?Pulse: 72 75  ?Resp: 16 18  ?Temp: 99.4 ?F (37.4 ?C) 99 ?F (37.2 ?C)  ?SpO2: 100% 98%  ? ? ?General Awake, no distress.  ?CV:  Good peripheral perfusion.  ?RESP:  Normal effort. CTA ?ABD:  No distention. Soft, nontender. ?MSK:  Normal composite fist.  ?NEURO: Normal gross sensation. Normal intrinsic and opposition testing on the right hand.  ? ? ?ED Results / Procedures / Treatments  ? ?Labs ?(all labs ordered are listed, but only abnormal results are displayed) ?Labs Reviewed  ?BASIC  METABOLIC PANEL - Abnormal; Notable for the following components:  ?    Result Value  ? Sodium 132 (*)   ? CO2 21 (*)   ? All other components within normal limits  ?CBC - Abnormal; Notable for the following components:  ? RBC 4.17 (*)   ? Hemoglobin 12.7 (*)   ? Platelets 411 (*)   ? All other components within normal limits  ?URINALYSIS, ROUTINE W REFLEX MICROSCOPIC - Abnormal; Notable for the following components:  ? Color, Urine YELLOW (*)   ? APPearance HAZY (*)   ? Hgb urine dipstick MODERATE (*)   ? Ketones, ur 20 (*)   ? Bacteria, UA RARE (*)   ? All other components within normal limits  ?LIPID PANEL - Abnormal; Notable for the following components:  ? HDL 29 (*)   ? All other components within normal limits  ?AEROBIC CULTURE W GRAM STAIN (SUPERFICIAL SPECIMEN)  ?HEMOGLOBIN A1C  ?CREATININE, SERUM  ?BASIC METABOLIC PANEL  ?CBC WITH DIFFERENTIAL/PLATELET  ?MAGNESIUM  ?CBG MONITORING, ED  ? ? ? ?EKG ? ? ?RADIOLOGY ? ?I personally viewed and evaluated these images as part of my medical  decision making, as well as reviewing the written report by the radiologist. ? ?ED Provider Interpretation: as noted} ? ?US Venous Img Lower Bilateral (DVT) ? ?Result Date: 03/19/2022 ?CLINICAL DATA:  HISTORY OF CVA, PATENT FORAMEN OVALE EXAM: BILATERAL LOWER EXTREMITY VENOUS DOPPLER ULTRASOUND TECHNIQUE: Gray-scale sonography with compression, as well as color and duplex ultrasound, were performed to evaluate the deep venous system(s) from the level of the common femoral vein through the popliteal and proximal calf veins. COMPARISON:  None. FINDINGS: VENOUS Normal compressibility of the common femoral, superficial femoral, and popliteal veins, as well as the visualized calf veins. Visualized portions of profunda femoral vein and great saphenous vein unremarkable. No filling defects to suggest DVT on grayscale or color Doppler imaging. Doppler waveforms show normal direction of venous flow, normal respiratory plasticity and  response to augmentation. Limited views of the contralateral common femoral vein are unremarkable. OTHER None. Limitations: none IMPRESSION: Negative. Electronically Signed   By: Kathreen Devoid M.D.   On: 03/19/2022 09:43   ? ? ?PROCEDURES: ? ?Critical Care performed: No ? ?Procedures ? ? ?MEDICATIONS ORDERED IN ED: ?Medications  ?aspirin EC tablet 81 mg (81 mg Oral Given 03/19/22 0834)  ?atorvastatin (LIPITOR) tablet 80 mg (80 mg Oral Given 03/19/22 0834)  ?metoprolol succinate (TOPROL-XL) 24 hr tablet 25 mg (25 mg Oral Given 03/19/22 0833)  ?clopidogrel (PLAVIX) tablet 75 mg (75 mg Oral Given 03/19/22 0834)  ?albuterol (PROVENTIL) (2.5 MG/3ML) 0.083% nebulizer solution 2.5 mg (has no administration in time range)  ? stroke: early stages of recovery book (has no administration in time range)  ?acetaminophen (TYLENOL) tablet 650 mg (650 mg Oral Given 03/18/22 2035)  ?  Or  ?acetaminophen (TYLENOL) 160 MG/5ML solution 650 mg ( Per Tube See Alternative 03/18/22 2035)  ?  Or  ?acetaminophen (TYLENOL) suppository 650 mg ( Rectal See Alternative 03/18/22 2035)  ?Chlorhexidine Gluconate Cloth 2 % PADS 6 each (6 each Topical Given 03/19/22 0515)  ?labetalol (NORMODYNE) injection 10 mg (has no administration in time range)  ?traMADol (ULTRAM) tablet 50 mg (has no administration in time range)  ?amoxicillin-clavulanate (AUGMENTIN) 875-125 MG per tablet 1 tablet (has no administration in time range)  ?alum & mag hydroxide-simeth (MAALOX/MYLANTA) 200-200-20 MG/5ML suspension 30 mL (has no administration in time range)  ?HYDROcodone-acetaminophen (NORCO/VICODIN) 5-325 MG per tablet 1 tablet (1 tablet Oral Given 03/18/22 1356)  ?doxycycline (VIBRA-TABS) tablet 100 mg (100 mg Oral Given 03/18/22 1500)  ?vancomycin (VANCOCIN) IVPB 1000 mg/200 mL premix (0 mg Intravenous Stopped 03/18/22 2133)  ?  Followed by  ?vancomycin (VANCOREADY) IVPB 1500 mg/300 mL (0 mg Intravenous Stopped 03/18/22 2322)  ? ? ? ?IMPRESSION / MDM / ASSESSMENT AND PLAN / ED  COURSE  ?I reviewed the triage vital signs and the nursing notes. ?             ?               ? ?Differential diagnosis includes, but is not limited to, alcohol, illicit or prescription medications, or other toxic ingestion; intracranial pathology such as stroke or intracerebral hemorrhage; fever or infectious causes including sepsis; hypoxemia and/or hypercarbia; uremia; trauma; endocrine related disorders such as diabetes, hypoglycemia, and thyroid-related diseases; hypertensive encephalopathy; etc. ? ?Patient to the ED for evaluation of right and paresthesias and weakness. Patient is evaluated for his complaint with routine labs and imaging. His initial CT scan is normal. Further evaluation by MRI shows what appears as new infarcts.  Patient's diagnosis is consistent with new  acute ischemic stroke. Patient will be admitted to the hospital service.  ? ?Clinical Course as of 03/19/22 2024  ?Tue Mar 18, 2022  ?1443 Discussed concern for recurrent stroke with Dr. Quinn Axe.  She is amenable to admission and will reconsult on the patient.  She advises to continue his Plavix and aspirin.  He reports he took his Plavix today but was not able to get aspirin as the pharmacy did not provide it to him and he had to purchase it.  He has not been on aspirin for several days.  Dr. Quinn Axe advises administration of 325 mg aspirin today and management with permissive hypertension and readmission to the hospital.  Patient understanding and agreeable with this plan [MQ]  ?  ?Clinical Course User Index ?[MQ] Delman Kitten, MD  ? ? ? ?FINAL CLINICAL IMPRESSION(S) / ED DIAGNOSES  ? ?Final diagnoses:  ?Cellulitis of multiple sites of buttock  ?Acute stroke due to ischemia [facility])  ? ? ? ?Rx / DC Orders  ? ?ED Discharge Orders   ? ?      Ordered  ?  aspirin 81 MG chewable tablet  Daily       ? 03/19/22 1250  ?  Ambulatory referral to Neurology       ? 03/19/22 0917  ? ?  ?  ? ?  ? ? ? ?Note:  This document was prepared using Dragon voice  recognition software and may include unintentional dictation errors. ? ?  ?Melvenia Needles, PA-C ?03/19/22 2034 ? ?  ?Delman Kitten, MD ?03/20/22 1541 ? ?

## 2022-03-18 NOTE — Assessment & Plan Note (Addendum)
-   recent CVA on 4/1 (bilateral with concern for emboli) ?- recommended to follow up outpt with cardiology for ILR; will reach out while hospitalized in case able to place any monitoring prior to discharge ?- LE Duplex on 4/12 negative for DVT ?- no formal neuro eval needed given full stroke workup recently; appreciate note placed on 4/12 by neurology ?- continue asa and plavix for 90 days then monotherapy Plavix (spoke with med management, they will be able to provide both at discharge) ?

## 2022-03-18 NOTE — Consult Note (Signed)
Subjective:  ? ?CC: multiple boils ? ?HPI: ? James Lambert is a 56 y.o. male who was consulted by [facility] for evaluation of above.  Chronic issue, acute worsening past few days.  Symptoms include: Pain is sharp, localized to area.  Exacerbated by touch.  Alleviated by nothing.  Associated with drainage.   ?  ?Past Medical History:  has a past medical history of Asthma, COPD (chronic obstructive pulmonary disease) (HCC), Hidradenitis suppurativa, and Hypertension. ? ?Past Surgical History:  has a past surgical history that includes TEE without cardioversion (N/A, 03/11/2022). ? ?Family History: family history is not on file. ? ?Social History:  reports that he has been smoking cigarettes. He has been smoking an average of .5 packs per day. He has never used smokeless tobacco. He reports current alcohol use. He reports current drug use. Drug: Marijuana. ? ?Current Medications:  ?Prior to Admission medications   ?Medication Sig Start Date End Date Taking? Authorizing Provider  ?aspirin EC 81 MG EC tablet Take 1 tablet (81 mg total) by mouth daily. Swallow whole. 03/12/22  Yes Darlin Priestly, MD  ?atorvastatin (LIPITOR) 80 MG tablet Take 1 tablet (80 mg total) by mouth once daily. 03/12/22  Yes Darlin Priestly, MD  ?clopidogrel (PLAVIX) 75 MG tablet Take 1 tablet (75 mg total) by mouth once daily. 03/12/22  Yes Darlin Priestly, MD  ?metoprolol succinate (TOPROL-XL) 25 MG 24 hr tablet Take 1 tablet (25 mg total) by mouth once daily. 03/11/22  Yes Darlin Priestly, MD  ?albuterol (PROAIR HFA) 108 (90 Base) MCG/ACT inhaler Inhale 2 puffs into the lungs once every 6 (six) hours as needed for wheezing or shortness of breath. ?Patient not taking: Reported on 03/18/2022 01/01/22   Gilles Chiquito, MD  ?amLODipine (NORVASC) 5 MG tablet Take 2 tablets (10 mg total) by mouth once daily. ?Patient not taking: Reported on 03/18/2022 03/11/22   Darlin Priestly, MD  ? ? ?Allergies:  ?No Known Allergies ? ?ROS:  ?General: Denies weight loss, weight gain, fatigue, fevers, chills,  and night sweats. ?Eyes: Denies blurry vision, double vision, eye pain, itchy eyes, and tearing. ?Ears: Denies hearing loss, earache, and ringing in ears. ?Nose: Denies sinus pain, congestion, infections, runny nose, and nosebleeds. ?Mouth/throat: Denies hoarseness, sore throat, bleeding gums, and difficulty swallowing. ?Heart: Denies chest pain, palpitations, racing heart, irregular heartbeat, leg pain or swelling, and decreased activity tolerance. ?Respiratory: Denies breathing difficulty, shortness of breath, wheezing, cough, and sputum. ?GI: Denies change in appetite, heartburn, nausea, vomiting, constipation, diarrhea, and blood in stool. ?GU: Denies difficulty urinating, pain with urinating, urgency, frequency, blood in urine. ?Musculoskeletal: Denies joint stiffness, pain, swelling, muscle weakness. ?Skin: Denies rash, itching, mass, tumors ?Neurologic: Denies headache, fainting, dizziness, seizures, numbness, and tingling. ?Psychiatric: Denies depression, anxiety, difficulty sleeping, and memory loss. ?Endocrine: Denies heat or cold intolerance, and increased thirst or urination. ?Blood/lymph: Denies easy bruising, easy bruising, and swollen glands ? ? ?  ?Objective:  ?  ? ?BP (!) 175/96   Pulse 81   Temp 98.6 ?F (37 ?C) (Oral)   Resp 15   Ht 6\' 3"  (1.905 m)   Wt 113.4 kg   SpO2 97%   BMI 31.25 kg/m?  ? ?Constitutional :  alert, cooperative, appears stated age, and no distress  ?Lymphatics/Throat:  no asymmetry, masses, or scars  ?Respiratory:  clear to auscultation bilaterally  ?Cardiovascular:  regular rate and rhythm  ?Gastrointestinal: soft, non-tender; bowel sounds normal; no masses,  no organomegaly.  ?Musculoskeletal: Steady gait and movement  ?  Skin: Cool and moist, groin and bilateral buttock area with extensive induration and active drainage from openings consistent with long standing hidradinitis, abscess formation   ?Psychiatric: Normal affect, non-agitated, not confused  ?   ?  ?LABS:  ? ?   Latest Ref Rng & Units 03/18/2022  ? 11:42 AM 03/11/2022  ?  3:58 AM 03/10/2022  ?  5:32 AM  ?CMP  ?Glucose 70 - 99 mg/dL 81   99   97    ?BUN 6 - 20 mg/dL 12   12   10     ?Creatinine 0.61 - 1.24 mg/dL   4.56   2.56    ?Sodium 135 - 145 mmol/L 132   134   135    ?Potassium 3.5 - 5.1 mmol/L 4.2   3.7   3.6    ?Chloride 98 - 111 mmol/L 100   100   100    ?CO2 22 - 32 mmol/L 21   26   27     ?Calcium 8.9 - 10.3 mg/dL 9.1   8.9   9.0    ? ? ?  Latest Ref Rng & Units 03/18/2022  ? 11:42 AM 03/11/2022  ?  3:58 AM 03/10/2022  ?  5:32 AM  ?CBC  ?WBC 4.0 - 10.5 K/uL 9.2   6.1   5.9    ?Hemoglobin 13.0 - 17.0 g/dL 05/11/2022   05/10/2022   37.3    ?Hematocrit 39.0 - 52.0 % 39.2   38.0   38.6    ?Platelets 150 - 400 K/uL 411   373   364    ? ? ?RADS: ?CLINICAL DATA:  Stroke, follow up ?  ?EXAM: ?MRI HEAD WITHOUT CONTRAST ?  ?TECHNIQUE: ?Multiplanar, multiecho pulse sequences of the brain and surrounding ?structures were obtained without intravenous contrast. ?  ?COMPARISON:  03/08/2022 ?  ?FINDINGS: ?Brain: New small focus of reduced diffusion in the left subinsular ?white matter. Expected evolution of infarcts seen on the prior ?study, which remain visible on T2 imaging. No intracranial ?hemorrhage. Stable findings of probable chronic microvascular ?ischemic changes in the cerebral white matter. Small bilateral ?chronic cerebellar infarcts. Small chronic left basal ganglia ?infarct. Ventricles are stable in size. ?  ?Vascular: Major vessel flow voids at the skull base are preserved. ?  ?Skull and upper cervical spine: Normal marrow signal is preserved. ?  ?Sinuses/Orbits: Paranasal sinuses are aerated. Orbits are ?unremarkable. ?  ?Other: Sella is unremarkable.  Mastoid air cells are clear. ?  ?IMPRESSION: ?New small acute infarct of the left subinsular white matter. ?  ?Expected evolution of infarcts on the recent prior study. Otherwise ?no change. ?  ?  ?Electronically Signed ?  By: 76.8 M.D. ?  On: 03/18/2022 14:29 ? ?Assessment:   ? ?   ?Multiple boils likely from Hidradinitis ?CVA ? ?Plan:  ? ?  ?Pt too high risk to undergo any surgical excision or drainage due to anticoagulation and continued evolution of stroke issues on recent exam.  Recommend continued IV abx treatment and only consider surgical intervention for worsening clinical symptoms, signs of overt sepsis. Will f/u with neurology to see when we can consider surgical management once benefits outweigh risk of further neurological deterioration. ? ?CHG bath in the meantime, warm compress to areas.  Avoid wearing diaper and allow area to adequately breath and stay dry with pads prn.   ? ? ?The patient verbalized understanding and all questions were answered to the patient's satisfaction. ? ?labs/images/medications/previous chart  entries reviewed personally and relevant changes/updates noted above. ? ? ? ?

## 2022-03-18 NOTE — Assessment & Plan Note (Addendum)
-   Okay to start lowering blood pressure at this time ?- Continue metoprolol ?- Resume amlodipine ?- Use labetalol or hydralazine PRN as well ?

## 2022-03-18 NOTE — Assessment & Plan Note (Addendum)
Smoking cessation has been discussed with patient in detail ?-Patient now amenable for nicotine patch ?

## 2022-03-18 NOTE — Assessment & Plan Note (Signed)
Not acutely exacerbated ?Continue as needed bronchodilator therapy ?

## 2022-03-18 NOTE — H&P (Addendum)
?History and Physical  ? ? ?Patient: James Lambert XNT:[id number] DOB: [birth year] ?DOA: 03/18/2022 ?DOS: the patient was seen and examined on 03/18/2022 ?PCP: Pcp, No  ?Patient coming from: Home ? ?Chief Complaint:  ?Chief Complaint  ?Patient presents with  ? Numbness  ? ?HPI: James Lambert is a 56 y.o. male with medical history significant for CVA with right-sided weakness, nicotine dependence, hypertension, COPD, hidradenitis suppurativa who was recently discharged from the hospital after he was diagnosed with an acute stroke with right-sided hemiparesis. ?Patient was supposed to be on dual antiplatelet therapy with aspirin and Plavix but states that he has only been on Plavix since his discharge.  He was asked to buy the aspirin by the staff at the [facility] clinic but states that he did not have the funds to get the aspirin.  He has also been compliant with his antihypertensive medications but continues to smoke. ?He developed new numbness in his right upper extremity 2 days prior to presentation and when it did not improve he decided to come into the ER for further evaluation. ?He denies having any headache, no blurred vision, no difficulty swallowing, no weakness involving any of his extremities, no dizziness or lightheadedness. ?He also complains of pain in his gluteal area from an abscess which is currently draining.  He denies having any fever or chills. ?Review of Systems: As mentioned in the history of present illness. All other systems reviewed and are negative. ?Past Medical History:  ?Diagnosis Date  ? Asthma   ? COPD (chronic obstructive pulmonary disease) (HCC)   ? Hidradenitis suppurativa   ? diagnosed in [facility] ED based on history and physical exam  ? Hypertension   ? ?Past Surgical History:  ?Procedure Laterality Date  ? TEE WITHOUT CARDIOVERSION N/A 03/11/2022  ? Procedure: TRANSESOPHAGEAL ECHOCARDIOGRAM (TEE);  Surgeon: Lamar Blinks, MD;  Location: [facility] ORS;  Service: Cardiovascular;  Laterality:  N/A;  ? ?Social History:  reports that he has been smoking cigarettes. He has been smoking an average of .5 packs per day. He has never used smokeless tobacco. He reports current alcohol use. He reports current drug use. Drug: Marijuana. ? ?No Known Allergies ? ?History reviewed. No pertinent family history. ? ?Prior to Admission medications   ?Medication Sig Start Date End Date Taking? Authorizing Provider  ?aspirin EC 81 MG EC tablet Take 1 tablet (81 mg total) by mouth daily. Swallow whole. 03/12/22  Yes Darlin Priestly, MD  ?atorvastatin (LIPITOR) 80 MG tablet Take 1 tablet (80 mg total) by mouth once daily. 03/12/22  Yes Darlin Priestly, MD  ?clopidogrel (PLAVIX) 75 MG tablet Take 1 tablet (75 mg total) by mouth once daily. 03/12/22  Yes Darlin Priestly, MD  ?metoprolol succinate (TOPROL-XL) 25 MG 24 hr tablet Take 1 tablet (25 mg total) by mouth once daily. 03/11/22  Yes Darlin Priestly, MD  ?albuterol (PROAIR HFA) 108 (90 Base) MCG/ACT inhaler Inhale 2 puffs into the lungs once every 6 (six) hours as needed for wheezing or shortness of breath. ?Patient not taking: Reported on 03/18/2022 01/01/22   Gilles Chiquito, MD  ?amLODipine (NORVASC) 5 MG tablet Take 2 tablets (10 mg total) by mouth once daily. ?Patient not taking: Reported on 03/18/2022 03/11/22   Darlin Priestly, MD  ? ? ?Physical Exam: ?Vitals:  ? 03/18/22 1530 03/18/22 1600 03/18/22 1630 03/18/22 1700  ?BP: (!) 175/96 (!) 174/120 (!) 170/116 (!) 165/104  ?Pulse: 81 78 79 73  ?Resp: 15 19 16    ?Temp:      ?  TempSrc:      ?SpO2: 97% 97% 98% 98%  ?Weight:      ?Height:      ? ?Physical Exam ?Vitals and nursing note reviewed.  ?Constitutional:   ?   Appearance: Normal appearance.  ?HENT:  ?   Head: Normocephalic and atraumatic.  ?   Nose: Nose normal.  ?   Mouth/Throat:  ?   Mouth: Mucous membranes are moist.  ?Eyes:  ?   Pupils: Pupils are equal, round, and reactive to light.  ?Cardiovascular:  ?   Rate and Rhythm: Normal rate and regular rhythm.  ?Pulmonary:  ?   Effort: Pulmonary effort is  normal.  ?   Breath sounds: Normal breath sounds.  ?Abdominal:  ?   General: Abdomen is flat. Bowel sounds are normal.  ?   Palpations: Abdomen is soft.  ?Musculoskeletal:     ?   General: Normal range of motion.  ?   Cervical back: Normal range of motion and neck supple.  ?Skin: ?   General: Skin is warm and dry.  ?Neurological:  ?   Mental Status: He is alert and oriented to person, place, and time.  ?   Comments: Able to move all extremities  ?Psychiatric:     ?   Mood and Affect: Mood normal.     ?   Behavior: Behavior normal.  ? ? ?Data Reviewed: ?Relevant notes from primary care and specialist visits, past discharge summaries as available in EHR, including Care Everywhere. ?Prior diagnostic testing as pertinent to current admission diagnoses ?Updated medications and problem lists for reconciliation ?ED course, including vitals, labs, imaging, treatment and response to treatment ?Triage notes, nursing and pharmacy notes and ED provider's notes ?Notable results as noted in HPI ?Labs reviewed.  Sodium 132, potassium 4.2, chloride 100, bicarb 21, BUN 12, creatinine 1.09, white count 9.2, hemoglobin 12.7, hematocrit 39.2, platelet count 411 ?CT scan of the head without contrast shows no evidence of acute intracranial hemorrhage ?MRI of the brain without contrast shows new small acute infarct of the left subinsular white matter. ?Expected evolution of infarcts on the recent prior study. Otherwise no change. ?Twelve-lead EKG reviewed by me shows normal sinus rhythm ?There are no new results to review at this time. ? ?Assessment and Plan: ?* Acute CVA (cerebrovascular accident) (HCC) ?Patient with a history of a recent stroke concerning for an embolic stroke with right-sided hemiparesis ?He had extensive work-up which included a TEE that did not show any evidence of cardiac thrombus. ?Patient was advised to follow-up with cardiology as an outpatient for placement of a loop recorder. ?He was discharged home on a  7958-month course of aspirin and Plavix but has only been on Plavix since his discharge because according to him he was unable to afford the aspirin. ?He presents for evaluation of new onset numbness in his right upper extremity and is found to have a new small acute infarct of the left subinsular white matter. ?Continue dual antiplatelet therapy as well as high intensity statins ?Optimize blood pressure control ?Consult neurology ?We will request PT/OT/ST consult ? ?Hypertension ?Blood pressure is uncontrolled ?Continue Toprol ? ?Nicotine dependence ?Smoking cessation has been discussed with patient in detail ?He declines a nicotine transdermal patch at this time ? ?COPD (chronic obstructive pulmonary disease) (HCC) ?Not acutely exacerbated ?Continue as needed bronchodilator therapy ? ?Hidradenitis suppurativa ?[name] and has an acute flareup ?Requested surgical consult for possible I&D of a gluteal abscess ?Discussed  with Dr Tonna Boehringer who recommends IV antibiotics with vancomycin and Zosyn for now ?Follow-up results of wound cultures ? ? ? ? ? ? Advance Care Planning:   Code Status: Full Code  ? ?Consults: Neurology/surgery ? ?Family Communication: Greater than 50% of time was spent discussing patient's condition and plan of care with him at the bedside.  All questions and concerns have been addressed.  He verbalizes understanding and agrees with the plan. ? ?Severity of Illness: ?The appropriate patient status for this patient is OBSERVATION. Observation status is judged to be reasonable and necessary in order to provide the required intensity of service to ensure the patient's safety. The patient's presenting symptoms, physical exam findings, and initial radiographic and laboratory data in the context of their medical condition is felt to place them at decreased risk for further clinical deterioration. Furthermore, it is anticipated that the patient will be medically stable  for discharge from the hospital within 2 midnights of admission.  ? ?Author: ?Lucile Shutters, MD ?03/18/2022 5:49 PM ? ?For on call review [email or web address].  ?

## 2022-03-18 NOTE — ED Notes (Signed)
Patient transported to MRI 

## 2022-03-18 NOTE — ED Provider Notes (Signed)
? ?  Medical screening examination/treatment/procedure(s) were conducted as a shared visit with non-physician practitioner(s) and myself.  I personally evaluated the patient during the encounter. ? ? ?Personally reviewed the patient's MRI of the brain. 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  ? ?Expected evolution of infarcts on the recent prior study. Otherwise  ?no change.  ? ? ?Upon review of the MRI, discussed case with Dr. Selina Cooley. ? ?Clinical Course as of 03/18/22 1446  ?Tue Mar 18, 2022  ?1443 Discussed concern for recurrent stroke with Dr. Selina Cooley.  She is amenable to admission and will reconsult on the patient.  She advises to continue his Plavix and aspirin.  He reports he took his Plavix today but was not able to get aspirin as the pharmacy did not provide it to him and he had to purchase it.  He has not been on aspirin for several days.  Dr. Selina Cooley advises administration of 325 mg aspirin today and management with permissive hypertension and readmission to the hospital.  Patient understanding and agreeable with this plan [MQ]  ?  ?Clinical Course User Index ?[MQ] Sharyn Creamer, MD  ? ? ?In addition to concern for new acute stroke, the patient also reports that he has had soreness over his buttocks around the butt cheeks for several days and since he left the hospital.  He reports doctor he previously saw about a week ago told him look like he had an infection over the edges of the buttock, but he did not start antibiotic.  On exam he has mild erythema, tenderness, and occasional areas of small induration along the margins of the upper gluteal cleft.  No obvious discrete abscess.  Will start patient on doxycycline.  I have also placed a wound consult to wound nurse for further follow-up and evaluation during patient's anticipate hospitalization.  Patient agreeable with this plan and will initiate antibiotic therapy.  No clear evidence by clinical exam of large or notable  abscess that would be amenable to drainage at this time. ? ? ?Patient understanding agree with plan for admission.  Discussed case with hospitalist for admission, discussed with hospitalist Dr. Joylene Igo ? ? ?  Sharyn Creamer, MD ?03/18/22 1501 ? ?

## 2022-03-19 ENCOUNTER — Observation Stay: Payer: Medicaid Other

## 2022-03-19 ENCOUNTER — Other Ambulatory Visit: Payer: Self-pay

## 2022-03-19 ENCOUNTER — Inpatient Hospital Stay: Payer: Medicaid Other

## 2022-03-19 DIAGNOSIS — Z91199 Patient's noncompliance with other medical treatment and regimen due to unspecified reason: Secondary | ICD-10-CM | POA: Diagnosis not present

## 2022-03-19 DIAGNOSIS — Q2112 Patent foramen ovale: Secondary | ICD-10-CM | POA: Diagnosis not present

## 2022-03-19 DIAGNOSIS — I10 Essential (primary) hypertension: Secondary | ICD-10-CM

## 2022-03-19 DIAGNOSIS — Z597 Insufficient social insurance and welfare support: Secondary | ICD-10-CM | POA: Diagnosis not present

## 2022-03-19 DIAGNOSIS — M25561 Pain in right knee: Secondary | ICD-10-CM | POA: Diagnosis present

## 2022-03-19 DIAGNOSIS — I639 Cerebral infarction, unspecified: Secondary | ICD-10-CM | POA: Diagnosis not present

## 2022-03-19 DIAGNOSIS — Z7902 Long term (current) use of antithrombotics/antiplatelets: Secondary | ICD-10-CM | POA: Diagnosis not present

## 2022-03-19 DIAGNOSIS — M1712 Unilateral primary osteoarthritis, left knee: Secondary | ICD-10-CM | POA: Diagnosis present

## 2022-03-19 DIAGNOSIS — M25562 Pain in left knee: Secondary | ICD-10-CM | POA: Diagnosis present

## 2022-03-19 DIAGNOSIS — S83282A Other tear of lateral meniscus, current injury, left knee, initial encounter: Secondary | ICD-10-CM | POA: Diagnosis present

## 2022-03-19 DIAGNOSIS — Z7982 Long term (current) use of aspirin: Secondary | ICD-10-CM | POA: Diagnosis not present

## 2022-03-19 DIAGNOSIS — Z79899 Other long term (current) drug therapy: Secondary | ICD-10-CM | POA: Diagnosis not present

## 2022-03-19 DIAGNOSIS — M659 Synovitis and tenosynovitis, unspecified: Secondary | ICD-10-CM | POA: Diagnosis present

## 2022-03-19 DIAGNOSIS — L0292 Furuncle, unspecified: Secondary | ICD-10-CM | POA: Diagnosis present

## 2022-03-19 DIAGNOSIS — F17210 Nicotine dependence, cigarettes, uncomplicated: Secondary | ICD-10-CM | POA: Diagnosis present

## 2022-03-19 DIAGNOSIS — G8929 Other chronic pain: Secondary | ICD-10-CM | POA: Diagnosis present

## 2022-03-19 DIAGNOSIS — J449 Chronic obstructive pulmonary disease, unspecified: Secondary | ICD-10-CM | POA: Diagnosis present

## 2022-03-19 DIAGNOSIS — I63412 Cerebral infarction due to embolism of left middle cerebral artery: Secondary | ICD-10-CM | POA: Diagnosis present

## 2022-03-19 DIAGNOSIS — G8191 Hemiplegia, unspecified affecting right dominant side: Secondary | ICD-10-CM | POA: Diagnosis present

## 2022-03-19 DIAGNOSIS — L732 Hidradenitis suppurativa: Secondary | ICD-10-CM

## 2022-03-19 DIAGNOSIS — F17219 Nicotine dependence, cigarettes, with unspecified nicotine-induced disorders: Secondary | ICD-10-CM

## 2022-03-19 DIAGNOSIS — M25462 Effusion, left knee: Secondary | ICD-10-CM | POA: Diagnosis present

## 2022-03-19 DIAGNOSIS — R29700 NIHSS score 0: Secondary | ICD-10-CM | POA: Diagnosis present

## 2022-03-19 DIAGNOSIS — L03317 Cellulitis of buttock: Secondary | ICD-10-CM | POA: Diagnosis present

## 2022-03-19 LAB — LIPID PANEL
Cholesterol: 83 mg/dL (ref 0–200)
HDL: 29 mg/dL — ABNORMAL LOW (ref >40)
LDL Cholesterol: 46 mg/dL (ref 0–99)
Total CHOL/HDL Ratio: 2.9 RATIO
Triglycerides: 39 mg/dL (ref <150)
VLDL: 8 mg/dL (ref 0–40)

## 2022-03-19 LAB — CREATININE, SERUM
Creatinine, Ser: 1.2 mg/dL (ref 0.61–1.24)
GFR, Estimated: >60 mL/min (ref >60)

## 2022-03-19 LAB — HEMOGLOBIN A1C
Hgb A1c MFr Bld: 5.3 % (ref 4.8–5.6)
Mean Plasma Glucose: 105.41 mg/dL

## 2022-03-19 IMAGING — MR MR KNEE*L* WO/W CM
9 series · 40 of 40 positions shown · IV contrast (10ml Gadavist)
Comparison: Left knee x-rays dated [DATE].

CLINICAL DATA: Chronic left knee pain and swelling.

EXAM:
MRI OF THE LEFT KNEE WITHOUT AND WITH CONTRAST
TECHNIQUE: Multiplanar, multisequence MR imaging of the left knee was performed
both before and after administration of intravenous contrast.
CONTRAST:  10mL GADAVIST GADOBUTROL 1 MMOL/ML IV SOLN

[Series 8: T2 fat-sat · axial · left · 4.0mm · 0.50mm/px · z∈[-76,+49]mm · 4 of 26 slices shown (1 of 3)]
[im 1/26]
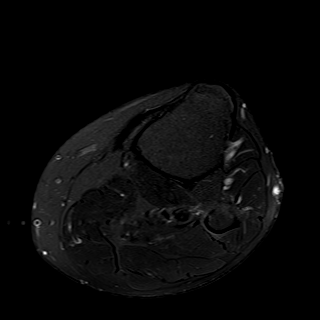
[im 9/26]
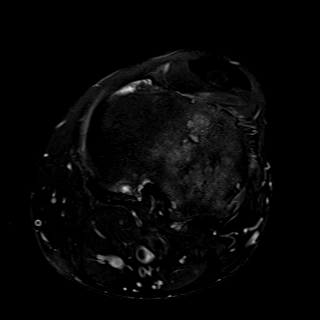
[im 17/26]
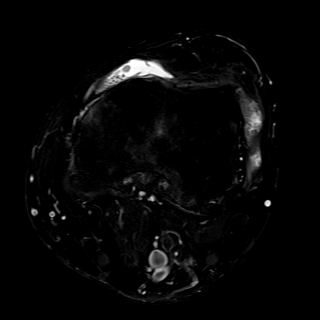
[im 26/26]
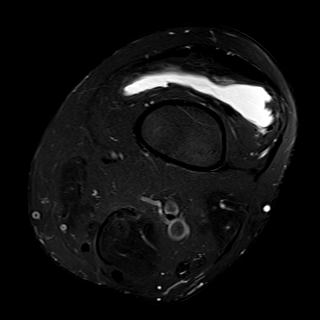

[Series 9: T1 · coronal · left · 4.0mm · 0.47mm/px · 5 of 32 slices shown]
[im 1/32]
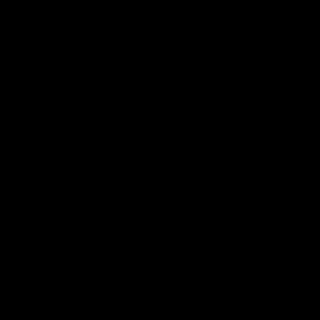
[im 8/32]
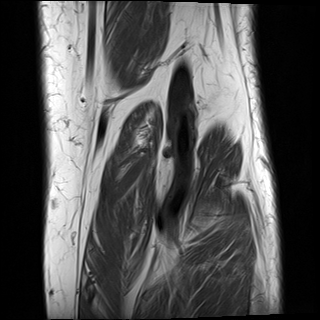
[im 16/32]
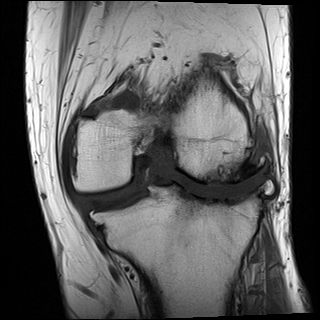
[im 24/32]
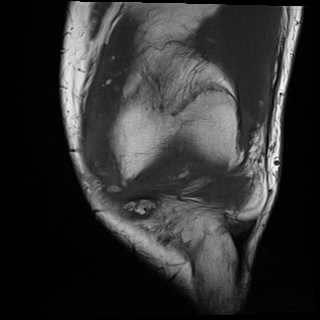
[im 32/32]
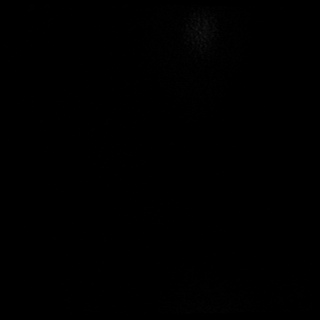

[Series 10: T2 fat-sat · coronal · left · 4.0mm · 0.47mm/px · 5 of 31 slices shown (2 of 3)]
[im 1/31]
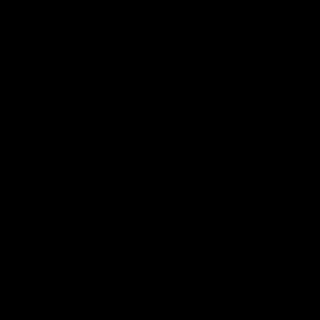
[im 8/31]
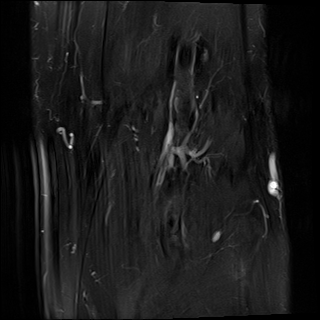
[im 16/31]
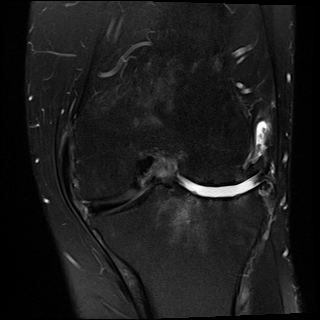
[im 23/31]
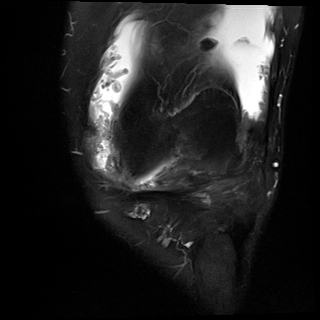
[im 31/31]
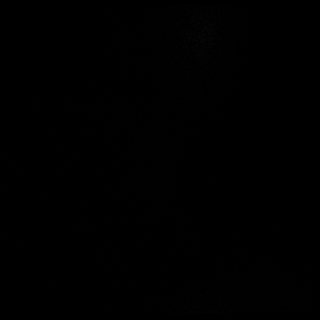

[Series 11: PD fat-sat · coronal · left · 4.0mm · 0.59mm/px · 4 of 30 slices shown (1 of 2)]
[im 1/30]
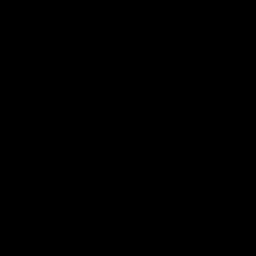
[im 10/30]
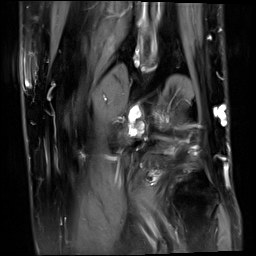
[im 20/30]
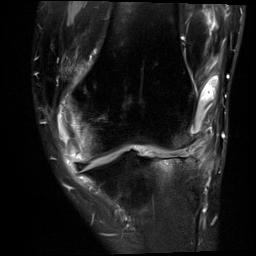
[im 30/30]
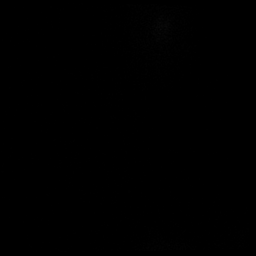

[Series 12: PD fat-sat · sagittal · left · 3.0mm · 0.47mm/px · 5 of 32 slices shown (2 of 2)]
[im 1/32]
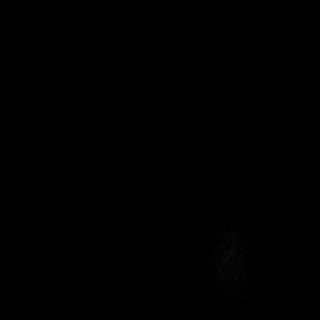
[im 8/32]
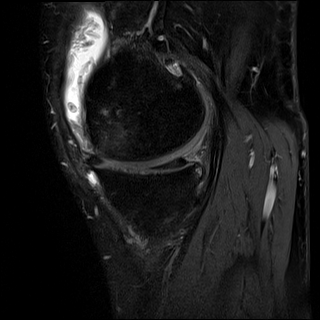
[im 16/32]
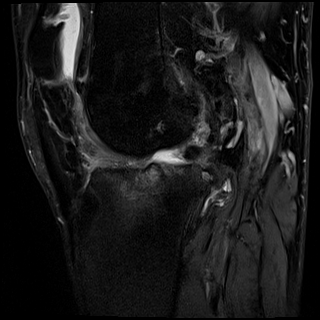
[im 24/32]
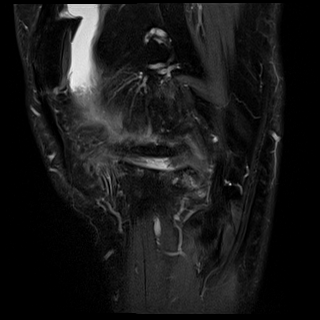
[im 32/32]
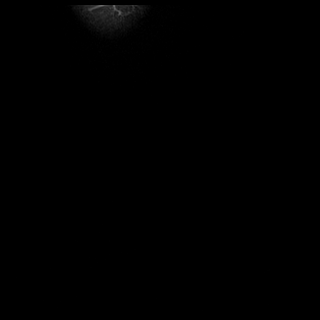

[Series 13: T2 fat-sat · sagittal · left · 3.0mm · 0.47mm/px · 5 of 35 slices shown (3 of 3)]
[im 1/35]
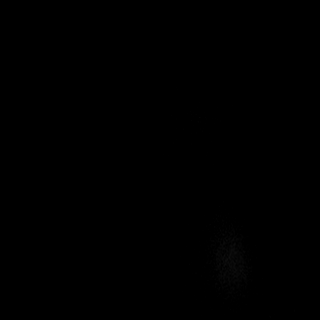
[im 9/35]
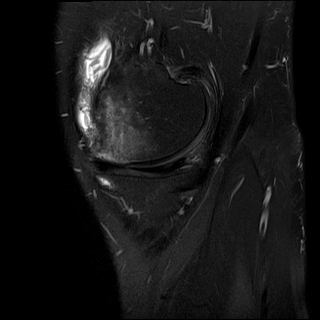
[im 18/35]
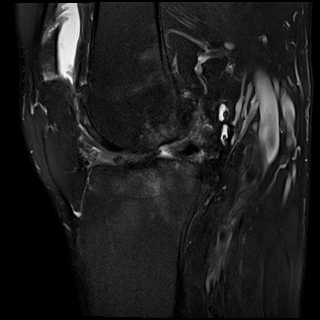
[im 26/35]
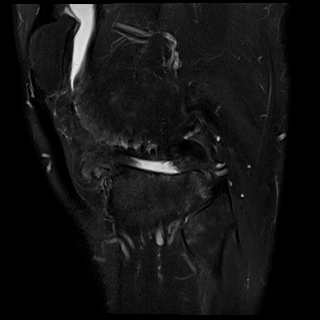
[im 35/35]
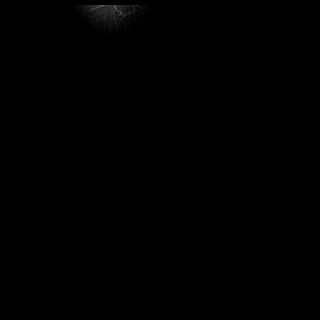

[Series 14: T1 fat-sat · axial · non-contrast · left · 4.0mm · 0.49mm/px · z∈[-75,+50]mm · 4 of 26 slices shown]
[im 1/26]
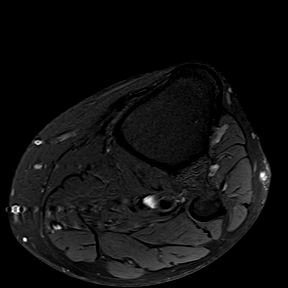
[im 9/26]
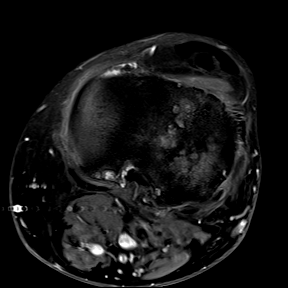
[im 17/26]
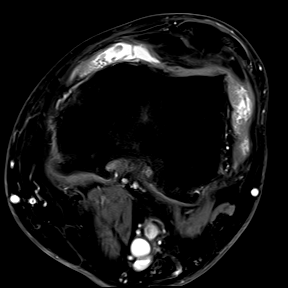
[im 26/26]
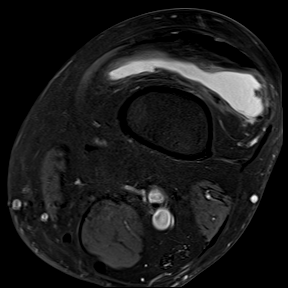

[Series 15: T1 fat-sat post-contrast · axial · left · 4.0mm · 0.49mm/px · z∈[-75,+50]mm · 4 of 26 slices shown (1 of 2)]
[im 1/26]
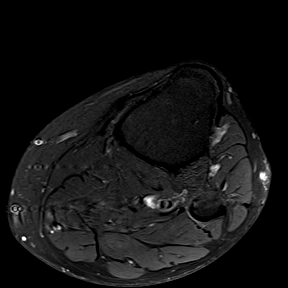
[im 9/26]
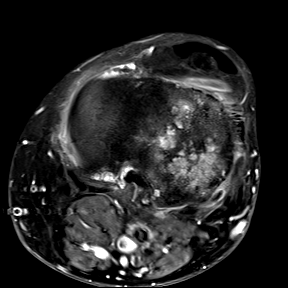
[im 17/26]
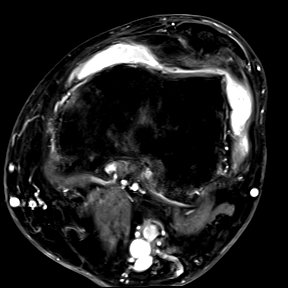
[im 26/26]
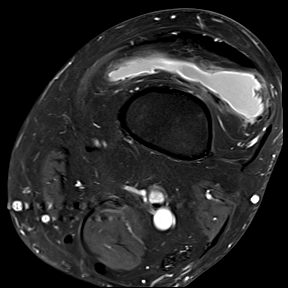

[Series 16: T1 fat-sat post-contrast · coronal · left · 4.0mm · 0.55mm/px · 4 of 29 slices shown (2 of 2)]
[im 1/29]
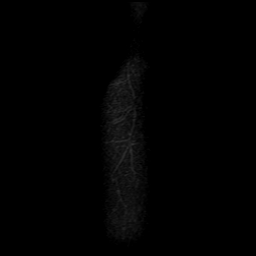
[im 10/29]
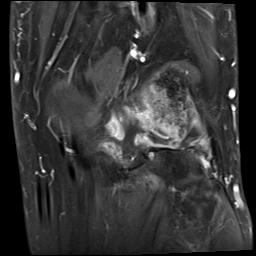
[im 19/29]
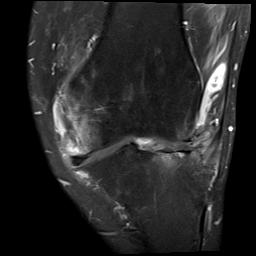
[im 29/29]
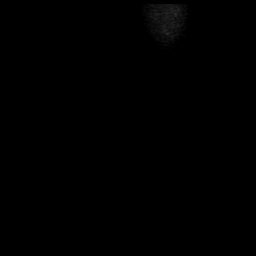

[40 of 40 positions shown; findings below may reference images not displayed]

FINDINGS: MENISCI

Medial meniscus: Small oblique tear at the body/posterior horn
junction.

Lateral meniscus:  Diffuse degeneration and radial tearing.

LIGAMENTS

Cruciates: Intact ACL and PCL.  Mucoid degeneration of the ACL.

Collaterals: Intact medial collateral ligament and lateral
collateral ligament complex.

CARTILAGE

Patellofemoral: Scattered partial-thickness fissuring and cartilage
loss over the patella. High-grade partial-thickness cartilage loss
over the superior trochlear groove.

Medial: Full-thickness cartilage loss over the peripheral medial
femoral condyle with subchondral marrow edema.

Lateral: Extensive full-thickness cartilage loss over the lateral
femoral condyle and lateral tibial plateau with subchondral marrow
edema and cystic change.

MISCELLANEOUS

Joint: Moderate joint effusion with irregular synovial thickening
and enhancement. Normal Hoffa's fat.

Popliteal Fossa: No Baker cyst. Intact popliteus tendon.

Extensor Mechanism: Intact quadriceps tendon and patellar tendon.
Intact medial and lateral patellar retinaculum. Intact MPFL.

Bones: No acute fracture or dislocation. No suspicious bone lesion.
Tricompartmental marginal osteophytes.

Other: None.
IMPRESSION: 1. Small oblique tear at the body/posterior horn junction of the
medial meniscus.
2. Diffuse degeneration and radial tearing of the lateral meniscus.
3. Tricompartmental osteoarthritis, severe in the lateral
compartment.
4. Moderate joint effusion with synovitis.

## 2022-03-19 MED ORDER — GADOBUTROL 1 MMOL/ML IV SOLN
10.0000 mL | Freq: Once | INTRAVENOUS | Status: AC | PRN
  Administered 2022-03-19: 10 mL via INTRAVENOUS

## 2022-03-19 MED ORDER — VANCOMYCIN HCL IN DEXTROSE 1-5 GM/200ML-% IV SOLN
1000.0000 mg | Freq: Two times a day (BID) | INTRAVENOUS | Status: DC
  Filled 2022-03-19: qty 200

## 2022-03-19 MED ORDER — ASPIRIN 81 MG PO CHEW
81.0000 mg | CHEWABLE_TABLET | Freq: Every day | ORAL | 0 refills | Status: DC
  Filled 2022-03-19 (×2): qty 30, 30d supply, fill #0

## 2022-03-19 MED ORDER — AMOXICILLIN-POT CLAVULANATE 875-125 MG PO TABS
1.0000 | ORAL_TABLET | Freq: Two times a day (BID) | ORAL | Status: DC
  Administered 2022-03-19 – 2022-03-21 (×4): 1 via ORAL
  Filled 2022-03-19 (×4): qty 1

## 2022-03-19 MED ORDER — ALUM & MAG HYDROXIDE-SIMETH 200-200-20 MG/5ML PO SUSP
30.0000 mL | Freq: Four times a day (QID) | ORAL | Status: DC | PRN
  Administered 2022-03-19: 30 mL via ORAL
  Filled 2022-03-19: qty 30

## 2022-03-19 NOTE — Progress Notes (Signed)
SLP Cancellation Note ? ?Patient Details ?Name: James Lambert ?MRN: [id number] ?DOB: 10-Apr-1966 ? ? ?Cancelled treatment:       Reason Eval/Treat Not Completed: SLP screened, no needs identified, will sign off. Pt seen for cognitive communication screen in the setting of "new small acute infarct of the left subinsular white matter." Pt with recent cognitive communication assessment on 4/2 with results indicating grossly functional cognitive ability. Pt passed [place], currently on regular solids and thin liquids. Pt w/o s/sx of aspiration with trials from breakfast tray completed during screen.  ? ?Today's screen notable for grossly functional receptive/expressive language for complex conversation and following commands for MD neuro exam and independent recall/application of information regarding current medical presentation and stroke education. Pt endorsed that speech and cognition is at baseline.  ? ?No further SLP services indicated at this time.  ? ?[place] J Clapp, MS CCC-SLP  ? ? ?Martinique J Clapp ?03/19/2022, 11:06 AM ?

## 2022-03-19 NOTE — Progress Notes (Addendum)
Subjective:  ?CC: ?James Lambert is a 56 y.o. male  Hospital stay day 0,   CVA, hidradinitis ? ?HPI: ?No acute issues overnight. ? ?ROS:  ?General: Denies weight loss, weight gain, fatigue, fevers, chills, and night sweats. ?Heart: Denies chest pain, palpitations, racing heart, irregular heartbeat, leg pain or swelling, and decreased activity tolerance. ?Respiratory: Denies breathing difficulty, shortness of breath, wheezing, cough, and sputum. ?GI: Denies change in appetite, heartburn, nausea, vomiting, constipation, diarrhea, and blood in stool. ?GU: Denies difficulty urinating, pain with urinating, urgency, frequency, blood in urine. ? ? ?Objective:  ? ?Temp:  [98 ?F (36.7 ?C)-98.6 ?F (37 ?C)] 98.6 ?F (37 ?C) (04/12 0724) ?Pulse Rate:  [69-82] 69 (04/12 0724) ?Resp:  [15-20] 16 (04/12 0724) ?BP: (135-175)/(82-120) 162/100 (04/12 0724) ?SpO2:  [95 %-100 %] 99 % (04/12 0724)     Height: 6\' 3"  (190.5 cm) Weight: 113.4 kg BMI (Calculated): 31.25  ? ?Intake/Output this shift:  ? ?Intake/Output Summary (Last 24 hours) at 03/19/2022 1148 ?Last data filed at 03/19/2022 [id number] ?Gross per 24 hour  ?Intake 404.13 ml  ?Output 525 ml  ?Net -120.87 ml  ? ? ?Constitutional :  alert, cooperative, appears stated age, and no distress  ?Respiratory:  clear to auscultation bilaterally  ?Cardiovascular:  regular rate and rhythm  ?Gastrointestinal: soft, non-tender; bowel sounds normal; no masses,  no organomegaly.   ?Skin: Cool and moist. Gluteal, perineal, and groin hidradinitis.  Focal swelling and drainage from right gluteal area has improved in TTP and also degree of swelling.  Persistent purulent drainage in perineal area as well, but no worsening compared to previous exam  ?Psychiatric: Normal affect, non-agitated, not confused  ?   ?  ?LABS:  ? ?  Latest Ref Rng & Units 03/19/2022  ?  3:34 AM 03/18/2022  ? 11:42 AM 03/11/2022  ?  3:58 AM  ?CMP  ?Glucose 70 - 99 mg/dL  81   99    ?BUN 6 - 20 mg/dL  12   12    ?Creatinine 0.61 - 1.24  mg/dL 1.20   1.09   0.97    ?Sodium 135 - 145 mmol/L  132   134    ?Potassium 3.5 - 5.1 mmol/L  4.2   3.7    ?Chloride 98 - 111 mmol/L  100   100    ?CO2 22 - 32 mmol/L  21   26    ?Calcium 8.9 - 10.3 mg/dL  9.1   8.9    ? ? ?  Latest Ref Rng & Units 03/18/2022  ? 11:42 AM 03/11/2022  ?  3:58 AM 03/10/2022  ?  5:32 AM  ?CBC  ?WBC 4.0 - 10.5 K/uL 9.2   6.1   5.9    ?Hemoglobin 13.0 - 17.0 g/dL 12.7   12.5   12.7    ?Hematocrit 39.0 - 52.0 % 39.2   38.0   38.6    ?Platelets 150 - 400 K/uL 411   373   364    ? ? ?RADS: ?N/a ?Assessment:  ? ?Hidradinitis- extensive, but no obvious need for local I&D at this time due to adequate drainage as well as CVA, anti-coag use. ? ?Pt requires long term monitoring medical management with multidisciplinary team at tertiary care center for his complicated and extensive hidradenitis involving entire gluteal fold area, perineum, and groin.  Recommend urgent referral if feasible. ? ?Surgery will continue to monitor for any worsening symptoms.  Still recommend continuing IV abx for another  day to ensure drainage and symptoms continue to decrease prior to switching to oral abx.  ? ?labs/images/medications/previous chart entries reviewed personally and relevant changes/updates noted above. ? ? ?

## 2022-03-19 NOTE — Evaluation (Signed)
Occupational Therapy Evaluation ?Patient Details ?Name: James Lambert ?MRN: [id number] ?DOB: 10/14/1966 ?Today's Date: 03/19/2022 ? ? ?History of Present Illness Pt is a 56 yo male that presented to the ED for R sided weakness, workup showed new small acute infarct of the left subinsular white matter. PMH of recent admission 03/08/2022 for patchy multifocal acute ischemic infarcts involving the cortical and subcortical left frontal, parietal, and occipital lobes, within additional ischemic infarcts involving the left greater than right thalami and right cerebellum, HTN, asthma.  ? ?Clinical Impression ?  ?Patient presenting with decreased Ind in self care, balance, functional mobility/transfers, endurance, and safety awareness. Patient reports living at home with brother whom he cares for but brother also have PCA's that assist. Pt endorses use of RW and SPC for ambulation in the mornings when B knees hurt. Pt does not drive but has family that assists him with grocery shopping etc.  Patient currently functioning at supervision - min guard for toileting and functional ambulation this session without use of AD. Pt does report pain with mobility but reports feeling close to baseline.   Patient will benefit from acute OT to increase overall independence in the areas of ADLs, functional mobility, and safety awareness in order to safely discharge home.  ?   ? ?Recommendations for follow up therapy are one component of a multi-disciplinary discharge planning process, led by the attending physician.  Recommendations may be updated based on patient status, additional functional criteria and insurance authorization.  ? ?Follow Up Recommendations ? Home health OT  ?  ?Assistance Recommended at Discharge Intermittent Supervision/Assistance  ?Patient can return home with the following A little help with walking and/or transfers;A little help with bathing/dressing/bathroom;Assistance with cooking/housework;Assist for  transportation ? ?  ?Functional Status Assessment ? Patient has had a recent decline in their functional status and demonstrates the ability to make significant improvements in function in a reasonable and predictable amount of time.  ?Equipment Recommendations ? None recommended by OT  ?  ?   ?Precautions / Restrictions Precautions ?Precautions: Fall ?Restrictions ?Weight Bearing Restrictions: No  ? ?  ? ?Mobility Bed Mobility ?Overal bed mobility: Modified Independent ?  ?  ?  ?  ?  ?  ?General bed mobility comments: increased time to complete with HOb elevated ?  ? ?Transfers ?Overall transfer level: Needs assistance ?Equipment used: None ?Transfers: Sit to/from Stand ?Sit to Stand: Supervision ?  ?  ?Step pivot transfers: Min guard ?  ?  ?General transfer comment: limited because of knee pain ?  ? ?  ?Balance Overall balance assessment: Needs assistance ?Sitting-balance support: No upper extremity supported, Feet supported ?Sitting balance-Leahy Scale: Good ?  ?  ?Standing balance support: No upper extremity supported, During functional activity ?Standing balance-Leahy Scale: Fair ?  ?  ?  ?  ?  ?  ?  ?  ?  ?  ?  ?  ?   ? ?ADL either performed or assessed with clinical judgement  ? ?ADL Overall ADL's : Needs assistance/impaired ?  ?  ?  ?  ?  ?  ?  ?  ?  ?  ?  ?  ?Toilet Transfer: Min guard ?  ?Toileting- Architect and Hygiene: Min guard ?  ?  ?  ?  ?   ? ? ? ?Vision   ?Vision Assessment?: No apparent visual deficits  ?   ?   ?   ? ?Pertinent Vitals/Pain Pain Assessment ?Pain Assessment: Faces ?Faces Pain Scale:  Hurts even more ?Pain Location: BLE knees ?Pain Descriptors / Indicators: Aching ?Pain Intervention(s): Limited activity within patient's tolerance, Monitored during session, Repositioned  ? ? ? ?Hand Dominance Right ?  ?Extremity/Trunk Assessment Upper Extremity Assessment ?Upper Extremity Assessment: Overall WFL for tasks assessed;Generalized weakness ?RUE Deficits / Details: R UE 4/5 but  with decreased dexterity, speed, and pt reporting numbness and tingling in all digits. ?  ?Lower Extremity Assessment ?Lower Extremity Assessment: Generalized weakness ?  ?Cervical / Trunk Assessment ?Cervical / Trunk Assessment: Normal ?  ?Communication Communication ?Communication: No difficulties ?  ?Cognition Arousal/Alertness: Awake/alert ?Behavior During Therapy: [facility] for tasks assessed/performed ?Overall Cognitive Status: Within Functional Limits for tasks assessed ?  ?  ?  ?  ?  ?  ?  ?  ?  ?  ?  ?  ?  ?  ?  ?  ?General Comments: Pleasant gentleman ?  ?  ?   ?   ?   ? ? ?Home Living Family/patient expects to be discharged to:: Private residence ?Living Arrangements: Other relatives (brother who has a disability) ?Available Help at Discharge: Friend(s);Available PRN/intermittently;Other (Comment) ?Type of Home: House ?Home Access: Stairs to enter ?Entrance Stairs-Number of Steps: 5 STE from front, and 3 STE from back. ?Entrance Stairs-Rails: Left;Right (rail in the middle) ?Home Layout: One level ?  ?  ?Bathroom Shower/Tub: Tub/shower unit ?  ?  ?  ?  ?Home Equipment: BSC/3in1;Shower seat;Grab bars - tub/shower;Cane - single point ?  ?Additional Comments: DME is pt's brother's equipment but pt has access to it if needed. ? Lives With: Family ? ?  ?Prior Functioning/Environment Prior Level of Function : Independent/Modified Independent ?  ?  ?  ?  ?  ?  ?Mobility Comments: Pt reports he was independent with functional mobility, pt provides physical assistance to his brother who has a disability. He does endorse use of brother's RW or cane in the morning depending on how he feels. ?ADLs Comments: Pt reports independent in ADLs and IADL ?  ? ?  ?  ?OT Problem List: Decreased strength;Impaired balance (sitting and/or standing);Decreased coordination;Impaired UE functional use;Impaired sensation;Decreased activity tolerance;Decreased safety awareness ?  ?   ?OT Treatment/Interventions: Self-care/ADL  training;Therapeutic exercise;Neuromuscular education;DME and/or AE instruction;Therapeutic activities;Visual/perceptual remediation/compensation;Patient/family education;Balance training  ?  ?OT Goals(Current goals can be found in the care plan section) Acute Rehab OT Goals ?Patient Stated Goal: to go home ?OT Goal Formulation: With patient ?Time For Goal Achievement: 04/02/22 ?Potential to Achieve Goals: Good ?ADL Goals ?Pt Will Perform Grooming: with modified independence;standing ?Pt Will Perform Lower Body Dressing: with modified independence;sit to/from stand ?Pt Will Transfer to Toilet: with modified independence;ambulating ?Pt Will Perform Toileting - Clothing Manipulation and hygiene: with modified independence;sit to/from stand  ?OT Frequency: Min 3X/week ?  ? ?   ?AM-PAC OT "6 Clicks" Daily Activity     ?Outcome Measure Help from another person eating meals?: None ?Help from another person taking care of personal grooming?: None ?Help from another person toileting, which includes using toliet, bedpan, or urinal?: A Little ?Help from another person bathing (including washing, rinsing, drying)?: A Little ?Help from another person to put on and taking off regular upper body clothing?: A Little ?Help from another person to put on and taking off regular lower body clothing?: A Little ?6 Click Score: 20 ?  ?End of Session Equipment Utilized During Treatment: Rolling walker (2 wheels) ?Nurse Communication: Mobility status ? ?Activity Tolerance: Patient tolerated treatment well ?Patient left: in chair;with call  bell/phone within reach ? ?OT Visit Diagnosis: Unsteadiness on feet (R26.81);Hemiplegia and hemiparesis ?Hemiplegia - Right/Left: Right ?Hemiplegia - dominant/non-dominant: Dominant ?Hemiplegia - caused by: Cerebral infarction  ?              ?Time: 1430-1450 ?OT Time Calculation (min): 20 min ?Charges:  OT General Charges ?$OT Visit: 1 Visit ?OT Evaluation ?$OT Eval Moderate Complexity: 1 Mod ?OT  Treatments ?$Self Care/Home Management : 8-22 mins ? ?Jackquline DenmarkKatie Tocara Mennen, MS, OTR/L , CBIS ?ascom [phone]  ?03/19/22, 3:14 PM  ?

## 2022-03-19 NOTE — Congregational Nurse Program (Signed)
Pharmacy Antibiotic Note ? ?James Lambert is a 56 y.o. male admitted on 03/18/2022 with acute CVA and acute on chronic worsening of boils with discharge 2/2 hidradenitis suppurativa . Pharmacy has been consulted for Vancomycin and Zosyn dosing. ? ?Plan: ?Zosyn 3.375g IV q8h (4 hour infusion). ?Vancomycin 2500 mg LD dose ( 1000 mg x 2 followed by 1500 mg ) ?Decrease Vancomycin 1250mg  q12 to 1000mg  q12h.  ?Goal AUC 400-550  ?Est AUC: 475 ?Est Cmax: 29.9 ?Est Cmin:13.2 ?Calculated with SCr 1.2 mg/dL ? ? ?Height: 6\' 3"  (190.5 cm) ?Weight: 113.4 kg (250 lb) ?IBW/kg (Calculated) : 84.5 ? ?Temp (24hrs), Avg:98.4 ?F (36.9 ?C), Min:98 ?F (36.7 ?C), Max:99.4 ?F (37.4 ?C) ? ?Recent Labs  ?Lab 03/18/22 ?1142 03/19/22 ?0334  ?WBC 9.2  --   ?CREATININE 1.09 1.20  ? ?  ?Estimated Creatinine Clearance: 95.7 mL/min (by C-G formula based on SCr of 1.2 mg/dL).   ? ?No Known Allergies ? ?Antimicrobials this admission: ?4/11 Doxcycline x 1  ?4/11 Zosyn >>  ?4/11 Vancomycin >> ? ?Dose adjustments this admission: ?4/12 Vancomycin 1.25gm q12 > 1gm q12 ? ?Microbiology results: ? ? ?Thank you for allowing pharmacy to be a part of this patient?s care. ? ?6/11, PharmD, BCPS ?Clinical Pharmacist   ?03/19/2022 3:35 PM ? ?

## 2022-03-19 NOTE — Evaluation (Signed)
Physical Therapy Evaluation ?Patient Details ?Name: James Lambert ?MRN: [id number] ?DOB: 09-25-1966 ?Today's Date: 03/19/2022 ? ?History of Present Illness ? Pt is a 56 yo male that presented to the ED for R sided weakness, workup showed new small acute infarct of the left subinsular white matter. PMH of recent admission 03/08/2022 for patchy multifocal acute ischemic infarcts involving the cortical and subcortical left frontal, parietal, and occipital lobes, within additional ischemic infarcts involving the left greater than right thalami and right cerebellum, HTN, asthma. ?  ?Clinical Impression ? Patient attempting to sleep prior to PT entrance, agreeable to PT with encouragement. Per pt and chart review at baseline he is independent, and takes care of family.  ? ?The patient demonstrated intact light touch sensation of LEs as well as intact gross motor. Supine to sit modI, and good sitting balance noted. He was able to sit <> stand with supervision from low surface. Pt limited in mobility today due to bilateral chronic knee pain, but able to ambulate ~2520ft in room. Further mobility deferred due to patient wanting to rest and pain. No LOB noted with no physical assist or AD. Overall the patient would benefit from further skilled PT to maximize function and independence, recommendation is HHPT.   ?   ? ?Recommendations for follow up therapy are one component of a multi-disciplinary discharge planning process, led by the attending physician.  Recommendations may be updated based on patient status, additional functional criteria and insurance authorization. ? ?Follow Up Recommendations Home health PT ? ?  ?Assistance Recommended at Discharge Intermittent Supervision/Assistance  ?Patient can return home with the following ? A little help with walking and/or transfers;A little help with bathing/dressing/bathroom;Assistance with cooking/housework;Direct supervision/assist for medications management ? ?  ?Equipment  Recommendations None recommended by PT  ?Recommendations for Other Services ?    ?  ?Functional Status Assessment Patient has had a recent decline in their functional status and demonstrates the ability to make significant improvements in function in a reasonable and predictable amount of time.  ? ?  ?Precautions / Restrictions Precautions ?Precautions: Fall ?Restrictions ?Weight Bearing Restrictions: No  ? ?  ? ?Mobility ? Bed Mobility ?Overal bed mobility: Modified Independent ?  ?  ?  ?  ?  ?  ?  ?  ? ?Transfers ?Overall transfer level: Needs assistance ?Equipment used: None ?Transfers: Sit to/from Stand ?Sit to Stand: Supervision ?  ?  ?  ?  ?  ?General transfer comment: limited because of knee pain ?  ? ?Ambulation/Gait ?Ambulation/Gait assistance: Min guard ?Gait Distance (Feet): 20 Feet ?Assistive device: None ?  ?  ?  ?  ?General Gait Details: limited today due to bilateral knee pain but no unsteadiness or LOB noted ? ?Stairs ?  ?  ?  ?  ?  ? ?Wheelchair Mobility ?  ? ?Modified Rankin (Stroke Patients Only) ?  ? ?  ? ?Balance Overall balance assessment: Needs assistance ?Sitting-balance support: No upper extremity supported, Feet supported ?Sitting balance-Leahy Scale: Good ?  ?  ?Standing balance support: No upper extremity supported, During functional activity ?Standing balance-Leahy Scale: Fair ?  ?  ?  ?  ?  ?  ?  ?  ?  ?  ?  ?  ?   ? ? ? ?Pertinent Vitals/Pain Pain Assessment ?Pain Assessment: Faces ?Faces Pain Scale: Hurts even more ?Pain Location: BLE knees ?Pain Descriptors / Indicators: Sore, Tightness ?Pain Intervention(s): Limited activity within patient's tolerance, Monitored during session  ? ? ?Home  Living Family/patient expects to be discharged to:: Private residence ?Living Arrangements: Other relatives (brother who has a disability) ?Available Help at Discharge: Friend(s);Available PRN/intermittently;Other (Comment) (pt's brother has a PCA who assists 2x/day & can assist PRN) ?Type of Home:  House ?Home Access: Stairs to enter ?Entrance Stairs-Rails:  (rail in the middle he can use on either L or R) ?Entrance Stairs-Number of Steps: 5 STE from front, and 3 STE from back. ?  ?Home Layout: One level ?Home Equipment: BSC/3in1;Shower seat;Grab bars - tub/shower;Cane - single point ?Additional Comments: DME is pt's brother's equipment but pt has access to it if needed.  ?  ?Prior Function   ?  ?  ?  ?  ?  ?  ?Mobility Comments: Pt reports he was independent with functional mobility, pt provides physical assistance to his brother who has a disability ?ADLs Comments: Pt report he was independent with ADLs/IADLs. Pt was caring for his brother./ ?  ? ? ?Hand Dominance  ?   ? ?  ?Extremity/Trunk Assessment  ? Upper Extremity Assessment ?Upper Extremity Assessment: Defer to OT evaluation ?  ? ?Lower Extremity Assessment ?Lower Extremity Assessment: Generalized weakness (denied decreased light touch sensation, gross motor coordination intact, limited by bilateral knee pain) ?  ? ?Cervical / Trunk Assessment ?Cervical / Trunk Assessment: Normal  ?Communication  ? Communication: Expressive difficulties  ?Cognition Arousal/Alertness: Awake/alert ?Behavior During Therapy: [facility] for tasks assessed/performed ?Overall Cognitive Status: Within Functional Limits for tasks assessed ?  ?  ?  ?  ?  ?  ?  ?  ?  ?  ?  ?  ?  ?  ?  ?  ?  ?  ?  ? ?  ?General Comments   ? ?  ?Exercises    ? ?Assessment/Plan  ?  ?PT Assessment Patient needs continued PT services  ?PT Problem List Decreased strength;Decreased mobility;Decreased activity tolerance;Decreased balance ? ?   ?  ?PT Treatment Interventions DME instruction;Therapeutic exercise;Gait training;Balance training;Stair training;Neuromuscular re-education;Functional mobility training;Therapeutic activities;Patient/family education   ? ?PT Goals (Current goals can be found in the Care Plan section)  ?Acute Rehab PT Goals ?Patient Stated Goal: to get some rest ?PT Goal Formulation:  With patient ?Time For Goal Achievement: 04/02/22 ?Potential to Achieve Goals: Good ? ?  ?Frequency 7X/week ?  ? ? ?Co-evaluation   ?  ?  ?  ?  ? ? ?  ?AM-PAC PT "6 Clicks" Mobility  ?Outcome Measure Help needed turning from your back to your side while in a flat bed without using bedrails?: None ?Help needed moving from lying on your back to sitting on the side of a flat bed without using bedrails?: None ?Help needed moving to and from a bed to a chair (including a wheelchair)?: None ?Help needed standing up from a chair using your arms (e.g., wheelchair or bedside chair)?: None ?Help needed to walk in hospital room?: None ?Help needed climbing 3-5 steps with a railing? : None ?6 Click Score: 24 ? ?  ?End of Session   ?Activity Tolerance: Patient tolerated treatment well;Other (comment) (limited by knee pain, wanted to take a nap) ?Patient left: in bed;with call bell/phone within reach ?Nurse Communication: Mobility status ?PT Visit Diagnosis: Other abnormalities of gait and mobility (R26.89);Unsteadiness on feet (R26.81) ?  ? ?Time: 2119-4174 ?PT Time Calculation (min) (ACUTE ONLY): 11 min ? ? ?Charges:   PT Evaluation ?$PT Eval Low Complexity: 1 Low ?  ?  ?   ? ? ?Olga Coaster PT,  DPT ?1:21 PM,03/19/22 ? ? ?

## 2022-03-19 NOTE — Progress Notes (Signed)
Brief Neurology Note ? ?This is a 56 yo gentleman with multiple cerebrovascular risk factors admitted one week ago with acute embolic strokes. Full stroke workup last week was unrevealing incl TEE that showed only a small bidirectional PFO. He was seen by cardiology that admission who stated they would consider implantable loop recorder given high concern for occult a fib causing the embolic strokes. ? ?He presents this week with multiple boils requiring IV abx and was also found to have a small acute infarct L insula on MRI. He already had complete stroke workup last week and there is no indication to repeat, other than performing BLE dopplers to r/o DVT causing paradoxical embolism in the setting of small PFO. Patient was discharged on aspirin and plavix but was only taking plavix 2/2 inability to afford OTC aspirin. ? ?Recommendations: ?- No indication for repeat stroke workup at this time. Source of strokes is central embolic. ?- BLE dopplers r/o DVT ?- Aspirin 81mg  daily + palvix 75mg  daily x90 days (given stroke within one week on plavix monotherapy). Ensure patient has mechanism to obtain/afford both at hospital discharge. If indication for anticoagulation is found and patient is started on therapeutic anticoagulation, there is no indication from a neuro standpoint to continue antiplatelets. ?- Recommend reaching back out to cardiology to let them know he has had another stroke and ask them to arrange for ILR ?- It does not appear that he has outpatient neurology f/u scheduled so I will place another ambulatory referral for outpatient neuro ? ?Feel free to reach back out to neuro if further questions arise. ? ? , MD ?[facility] ?[phone] ? ?If 7pm- 7am, please page neurology on call as listed in AMION. ? ?

## 2022-03-19 NOTE — Consult Note (Addendum)
NAME: James Lambert  ?DOB: 1966-10-26  ?MRN: [id number]  ?Date/Time: 03/19/2022 5:07 PM ? ?REQUESTING PROVIDER: Dr. Frederick Peers ?Subjective:  ?REASON FOR CONSULT: Hidradenitis suppurativa ?? ?James Lambert is a 56 y.o. male with a history of hypertension, noncompliance, strokes, hidradenitis suppurativa, current tobacco smoker presented with numbness in the right upper extremity of 2 days duration.  As it was not improving he came to the ED. ?He was recently in the hospital between 03/08/2022 until 03/11/2022 for right arm weakness and aphasia MRI brain showed patchy multifocal acute ischemic infarcts involving the cortical and subcortical left frontal, parietal and occipital lobes and additional ischemic infarcts involving the thalami and right cerebellum.  Embolic stroke was a concern.  Neuro was consulted.  TEE did not show any thrombus.  Continuous telemetry monitoring did not capture A-fib.Patient was discharged on aspirin and Plavix.  He also had uncontrolled BP and his amlodipine was increased to 10 mg and Toprol was added.  Patient does not have insurance and neither does he have a PCP.  He was given medications on the medication management clinic and was supposed to have been taking them Plavix and his antihypertensive medicines.  He was asked to buy aspirin but he did not. ?He is also complaining of pain in both knees which is chronic but getting worse. ?Patient this time is also been complaining of pain in the gluteal area from an abscess which is currently draining.  He has history of hidradenitis suppurativa for many years.  He has no fever.  He  lives with his brother.  He does not work.   ?In the ED BP 183/124, Temp 98.6, HR 89 and rr 18, sats 99%. WBC 9.2, HB 12.7, PLT 411 and cr 1.09 ?Past Medical History:  ?Diagnosis Date  ? Asthma   ? COPD (chronic obstructive pulmonary disease) (HCC)   ? Hidradenitis suppurativa   ? diagnosed in [facility] ED based on history and physical exam  ? Hypertension   ?  ?Past  Surgical History:  ?Procedure Laterality Date  ? TEE WITHOUT CARDIOVERSION N/A 03/11/2022  ? Procedure: TRANSESOPHAGEAL ECHOCARDIOGRAM (TEE);  Surgeon: Lamar Blinks, MD;  Location: [facility] ORS;  Service: Cardiovascular;  Laterality: N/A;  ?  ?Social History  ? ?Socioeconomic History  ? Marital status: Single  ?  Spouse name: Not on file  ? Number of children: Not on file  ? Years of education: Not on file  ? Highest education level: Not on file  ?Occupational History  ? Not on file  ?Tobacco Use  ? Smoking status: Every Day  ?  Packs/day: 0.50  ?  Types: Cigarettes  ? Smokeless tobacco: Never  ?Vaping Use  ? Vaping Use: Never used  ?Substance and Sexual Activity  ? Alcohol use: Yes  ?  Comment: daily  ? Drug use: Yes  ?  Types: Marijuana  ? Sexual activity: Not on file  ?Other Topics Concern  ? Not on file  ?Social History Narrative  ? Not on file  ? ?Social Determinants of Health  ? ?Financial Resource Strain: Not on file  ?Food Insecurity: Not on file  ?Transportation Needs: Not on file  ?Physical Activity: Not on file  ?Stress: Not on file  ?Social Connections: Not on file  ?Intimate Partner Violence: Not on file  ?  ?History reviewed. No pertinent family history. ?No Known Allergies ?I? ?Current Facility-Administered Medications  ?Medication Dose Route Frequency Provider Last Rate Last Admin  ?  stroke: early stages of recovery  book   Does not apply Once Agbata, Tochukwu, MD      ? acetaminophen (TYLENOL) tablet 650 mg  650 mg Oral Q4H PRN Agbata, Tochukwu, MD   650 mg at 03/18/22 2035  ? Or  ? acetaminophen (TYLENOL) 160 MG/5ML solution 650 mg  650 mg Per Tube Q4H PRN Agbata, Tochukwu, MD      ? Or  ? acetaminophen (TYLENOL) suppository 650 mg  650 mg Rectal Q4H PRN Agbata, Tochukwu, MD      ? albuterol (PROVENTIL) (2.5 MG/3ML) 0.083% nebulizer solution 2.5 mg  2.5 mg Inhalation Q6H PRN Agbata, Tochukwu, MD      ? amoxicillin-clavulanate (AUGMENTIN) 875-125 MG per tablet 1 tablet  1 tablet Oral Q12H  Ylonda Storr, Rhodia AlbrightJayashree, MD      ? aspirin EC tablet 81 mg  81 mg Oral Daily Agbata, Tochukwu, MD   81 mg at 03/19/22 0834  ? atorvastatin (LIPITOR) tablet 80 mg  80 mg Oral Daily Agbata, Tochukwu, MD   80 mg at 03/19/22 0834  ? Chlorhexidine Gluconate Cloth 2 % PADS 6 each  6 each Topical Q0600 Sung AmabileSakai, Isami, DO   6 each at 03/19/22 0515  ? clopidogrel (PLAVIX) tablet 75 mg  75 mg Oral Daily Agbata, Tochukwu, MD   75 mg at 03/19/22 0834  ? labetalol (NORMODYNE) injection 10 mg  10 mg Intravenous Q6H PRN Agbata, Tochukwu, MD      ? metoprolol succinate (TOPROL-XL) 24 hr tablet 25 mg  25 mg Oral Daily Agbata, Tochukwu, MD   25 mg at 03/19/22 [id number]  ? traMADol (ULTRAM) tablet 50 mg  50 mg Oral Q6H PRN Sung AmabileSakai, Isami, DO      ?  ? ?Abtx:  ?Anti-infectives (From admission, onward)  ? ? Start     Dose/Rate Route Frequency Ordered Stop  ? 03/19/22 2200  amoxicillin-clavulanate (AUGMENTIN) 875-125 MG per tablet 1 tablet       ? 1 tablet Oral Every 12 hours 03/19/22 1557    ? 03/19/22 2100  vancomycin (VANCOCIN) IVPB 1000 mg/200 mL premix  Status:  Discontinued       ? 1,000 mg ?200 mL/hr over 60 Minutes Intravenous Every 12 hours 03/19/22 1533 03/19/22 1557  ? 03/19/22 0900  vancomycin (VANCOREADY) IVPB 1250 mg/250 mL  Status:  Discontinued       ? 1,250 mg ?166.7 mL/hr over 90 Minutes Intravenous Every 12 hours 03/18/22 1853 03/19/22 1533  ? 03/18/22 2200  doxycycline (VIBRA-TABS) tablet 100 mg  Status:  Discontinued       ? 100 mg Oral Every 12 hours 03/18/22 1500 03/18/22 1748  ? 03/18/22 2200  piperacillin-tazobactam (ZOSYN) IVPB 3.375 g  Status:  Discontinued       ? 3.375 g ?12.5 mL/hr over 240 Minutes Intravenous Every 8 hours 03/18/22 1828 03/19/22 1557  ? 03/18/22 2100  vancomycin (VANCOREADY) IVPB 1500 mg/300 mL       ?See Hyperspace for full Linked Orders Report.  ? 1,500 mg ?150 mL/hr over 120 Minutes Intravenous  Once 03/18/22 1828 03/18/22 2322  ? 03/18/22 1930  vancomycin (VANCOCIN) IVPB 1000 mg/200 mL premix        ?See Hyperspace for full Linked Orders Report.  ? 1,000 mg ?200 mL/hr over 60 Minutes Intravenous  Once 03/18/22 1828 03/18/22 2133  ? 03/18/22 1500  doxycycline (VIBRA-TABS) tablet 100 mg       ? 100 mg Oral  Once 03/18/22 1452 03/18/22 1500  ? ?  ? ? ?REVIEW OF  SYSTEMS:  ?Const: negative fever, negative chills, negative weight loss ?Eyes: negative diplopia or visual changes, negative eye pain ?ENT: negative coryza, negative sore throat ?Resp: chronic  cough due to smoking, hemoptysis, no dyspnea ?Cards: negative for chest pain, palpitations, lower extremity edema ?GU: negative for frequency, dysuria and hematuria ?GI: Negative for abdominal pain, diarrhea, bleeding, constipation ?Skin: painful discharging lesions groin, intergluteal area ?Heme: negative for easy bruising and gum/nose bleeding ?MS: pain knees ?Neurolo:negative for headaches, dizziness, vertigo, memory problems , has rt arm numbness ?Psych: negative for feelings of anxiety, depression  ?Endocrine: negative for thyroid, diabetes ?Allergy/Immunology- negative for any medication or food allergies ?? ? ?Objective:  ?VITALS:  ?BP (!) 154/107 (BP Location: Right Arm)   Pulse 72   Temp 99.4 ?F (37.4 ?C)   Resp 16   Ht 6\' 3"  (1.905 m)   Wt 113.4 kg   SpO2 100%   BMI 31.25 kg/m?  ?PHYSICAL EXAM:  ?General: Alert, cooperative, no distress, appears stated age.  ?Head: Normocephalic, without obvious abnormality, atraumatic. ?Eyes: Conjunctivae clear, anicteric sclerae. Pupils are equal ?ENT Nares normal. No drainage or sinus tenderness. ?Lips, mucosa, and tongue normal. No Thrush ?Neck: Supple, symmetrical, no adenopathy, thyroid: non tender ?no carotid bruit and no JVD. ?Back: No CVA tenderness. ?Lungs: Clear to auscultation bilaterally. No Wheezing or Rhonchi. No rales. ?Heart: Regular rate and rhythm, no murmur, rub or gallop. ?Abdomen: Soft, non-tender,not distended. Bowel sounds normal. No masses ?Groin and intergluteal area has indurated soft  tissue with some opening with discharge- no erythema ? ? ?Extremities: atraumatic, no cyanosis. No edema. No clubbing ?Skin: No rashes or lesions. Or bruising ?Lymph: Cervical, supraclavicular normal. ?Neurologic: Grossly non-f

## 2022-03-19 NOTE — Hospital Course (Addendum)
James Lambert is a 56 year old male with PMH recent CVA with residual right sided weakness, HTN, COPD, hidradenitis suppurativa who presented with right arm paresthesias.  ?He was hospitalized 4/1 - 4/4 for an acute CVA which showed patchy multifocal acute ischemic infarcts bilaterally with concern for embolic phenomenon.  He had undergone full stroke work-up including TEE which showed a small bidirectional PFO.  He was recommended for following up with cardiology outpatient for placement of a loop recorder. ?He was also continued on aspirin and Plavix at discharge however he was unable to afford aspirin and was only on Plavix. ?He underwent repeat MRI brain on admission which showed a new small acute infarct of the left subinsular white matter. ? ?In addition, he was also having worsening pain and purulent drainage associated with his hidradenitis near his sacrum/perineum. He was started on Vanc/zosyn and also evaluated by general surgery.  ?He was then evaluated by infectious disease and de-escalated to Augmentin to complete a course.  He was recommended to follow-up with dermatology outpatient and was provided clinic information. ?He also underwent MRI left knee due to pain complaints and was found to have a meniscal tear and joint effusion.  Arthrocentesis was negative for septic arthritis.  Case was also discussed with orthopedic surgery with no need for brace or urgent inpatient evaluation and he will follow-up outpatient with orthopedic surgery as well; contact information given. ? ?Medications were sent to med management prior to discharge for medication assistance and he was informed to complete the eligibility packet to continue receiving ongoing medications. ?

## 2022-03-19 NOTE — Progress Notes (Signed)
?Progress Note ? ? ? ?James Lambert   ?WGY:[id number]  ?DOB: Oct 13, 1966  ?DOA: 03/18/2022     0 ?PCP: Pcp, No ? ?Initial CC: right arm numbness, wound on sacrum ? ?Hospital Course: ?James Lambert is a 56 year old male with PMH recent CVA with residual right sided weakness, HTN, COPD, hidradenitis suppurativa who presented with right arm paresthesias.  ?He was hospitalized 4/1 - 4/4 for an acute CVA which showed patchy multifocal acute ischemic infarcts bilaterally with concern for embolic phenomenon.  He had undergone full stroke work-up including TEE which showed a small bidirectional PFO.  He was recommended for following up with cardiology outpatient for placement of a loop recorder. ?He was also continued on aspirin and Plavix at discharge however he was unable to afford aspirin and was only on Plavix. ?He underwent repeat MRI brain on admission which showed a new small acute infarct of the left subinsular white matter. ? ?In addition, he was also having worsening pain and purulent drainage associated with his hidradenitis near his sacrum/perineum. He was started on Vanc/zosyn and also evaluated by general surgery.  ? ?Interval History:  ?Seen in his room this morning.  He was more comfortable in bed compared to admission stating his pain was improved associated with his hidradenitis.  Also had some improvement in right arm numbness.  We discussed the need for ongoing aspirin and Plavix at discharge.  He stated he would need medication assistance still. ? ?Assessment and Plan: ?* Acute CVA (cerebrovascular accident) (HCC) ?- recent CVA on 4/1 (bilateral with concern for emboli) ?- recommended to follow up outpt with cardiology for ILR; will reach out while hospitalized in case able to place any monitoring prior to discharge ?- LE Duplex on 4/12 negative for DVT ?- no formal neuro eval needed given full stroke workup recently; appreciate note placed on 4/12 by neurology ?- continue asa and plavix for 90 days then  monotherapy Plavix (will need to confirm affordability prior to d/c) ? ?Hidradenitis suppurativa ?- [name] and has an acute flareup ?- not a surgical candidate per surgery given acute CVA; for now trying to treat medically with abx ?- continue vanc/zosyn ?- will reach out to rheumatology to see if any benefit to consider immunosuppressants  ? ?Nicotine dependence ?Smoking cessation has been discussed with patient in detail ?He declines a nicotine transdermal patch at this time ? ?Hypertension ?- some permissive HTN today ?- will lower further tomorrow  ? ?COPD (chronic obstructive pulmonary disease) (HCC) ?Not acutely exacerbated ?Continue as needed bronchodilator therapy ? ? ? ?Old records reviewed in assessment of this patient ? ?Antimicrobials: ?Vancomycin 03/18/2022 >> current ?Zosyn 03/18/2022 >> current ? ?DVT prophylaxis:  ?SCD's Start: 03/18/22 1625 ? ? ?Code Status:   Code Status: Full Code ? ?Disposition Plan:  Home in 2-3 days ?Status is: Inpt ? ?Objective: ?Blood pressure (!) 141/89, pulse 73, temperature 98.5 ?F (36.9 ?C), resp. rate 16, height 6\' 3"  (1.905 m), weight 113.4 kg, SpO2 100 %.  ?Examination:  ?Physical Exam ?Constitutional:   ?   General: He is not in acute distress. ?   Appearance: Normal appearance.  ?HENT:  ?   Head: Normocephalic and atraumatic.  ?   Mouth/Throat:  ?   Mouth: Mucous membranes are moist.  ?Eyes:  ?   Extraocular Movements: Extraocular movements intact.  ?Cardiovascular:  ?   Rate and Rhythm: Normal rate and regular rhythm.  ?   Heart sounds: Normal heart sounds.  ?  Pulmonary:  ?   Effort: Pulmonary effort is normal. No respiratory distress.  ?   Breath sounds: Normal breath sounds. No wheezing.  ?Abdominal:  ?   General: Bowel sounds are normal. There is no distension.  ?   Palpations: Abdomen is soft.  ?   Tenderness: There is no abdominal tenderness.  ?Musculoskeletal:     ?   General: Normal range of motion.  ?   Cervical back:  Normal range of motion and neck supple.  ?Skin: ?   Comments: Malodorous wound appreciated in gluteal fold with tegaderm in place  ?Neurological:  ?   Mental Status: He is alert.  ?   Comments: 4+/5 in RUE  ?Psychiatric:     ?   Mood and Affect: Mood normal.     ?   Behavior: Behavior normal.  ?  ? ?Consultants:  ?General surgery ? ?Procedures:  ? ? ?Data Reviewed: ?Results for orders placed or performed during the hospital encounter of 03/18/22 (from the past 24 hour(s))  ?Urinalysis, Routine w reflex microscopic     Status: Abnormal  ? Collection Time: 03/18/22  3:01 PM  ?Result Value Ref Range  ? Color, Urine YELLOW (A) YELLOW  ? APPearance HAZY (A) CLEAR  ? Specific Gravity, Urine 1.021 1.005 - 1.030  ? pH 5.0 5.0 - 8.0  ? Glucose, UA NEGATIVE NEGATIVE mg/dL  ? Hgb urine dipstick MODERATE (A) NEGATIVE  ? Bilirubin Urine NEGATIVE NEGATIVE  ? Ketones, ur 20 (A) NEGATIVE mg/dL  ? Protein, ur NEGATIVE NEGATIVE mg/dL  ? Nitrite NEGATIVE NEGATIVE  ? Leukocytes,Ua NEGATIVE NEGATIVE  ? RBC / HPF 6-10 0 - 5 RBC/hpf  ? WBC, UA 6-10 0 - 5 WBC/hpf  ? Bacteria, UA RARE (A) NONE SEEN  ? Squamous Epithelial / LPF 0-5 0 - 5  ? Mucus PRESENT   ?Hemoglobin A1c     Status: None  ? Collection Time: 03/19/22  3:34 AM  ?Result Value Ref Range  ? Hgb A1c MFr Bld 5.3 4.8 - 5.6 %  ? Mean Plasma Glucose 105.41 mg/dL  ?Lipid panel     Status: Abnormal  ? Collection Time: 03/19/22  3:34 AM  ?Result Value Ref Range  ? Cholesterol 83 0 - 200 mg/dL  ? Triglycerides 39 <150 mg/dL  ? HDL 29 (L) >40 mg/dL  ? Total CHOL/HDL Ratio 2.9 RATIO  ? VLDL 8 0 - 40 mg/dL  ? LDL Cholesterol 46 0 - 99 mg/dL  ?Creatinine, serum     Status: None  ? Collection Time: 03/19/22  3:34 AM  ?Result Value Ref Range  ? Creatinine, Ser 1.20 0.61 - 1.24 mg/dL  ? GFR, Estimated >60 >60 mL/min  ?  ?I have Reviewed nursing notes, Vitals, and Lab results since pt's last encounter. Pertinent lab results : see above ?I have ordered test including BMP, CBC, Mg ?I have reviewed  the last note from staff over past 24 hours ?I have discussed pt's care plan and test results with nursing staff, case manager ? ? LOS: 0 days  ? ?Lewie Chamber, MD ?[facility] ?03/19/2022, 12:51 PM ? ?

## 2022-03-20 ENCOUNTER — Inpatient Hospital Stay: Payer: Medicaid Other | Admitting: Radiology

## 2022-03-20 DIAGNOSIS — I639 Cerebral infarction, unspecified: Secondary | ICD-10-CM | POA: Diagnosis not present

## 2022-03-20 DIAGNOSIS — M25462 Effusion, left knee: Secondary | ICD-10-CM | POA: Diagnosis not present

## 2022-03-20 DIAGNOSIS — L732 Hidradenitis suppurativa: Secondary | ICD-10-CM | POA: Diagnosis not present

## 2022-03-20 HISTORY — PX: IR FLUORO GUIDED NEEDLE PLC ASPIRATION/INJECTION LOC: IMG2395

## 2022-03-20 LAB — CBC WITH DIFFERENTIAL/PLATELET
Abs Immature Granulocytes: 0.06 10*3/uL (ref 0.00–0.07)
Basophils Absolute: 0 10*3/uL (ref 0.0–0.1)
Basophils Relative: 1 %
Eosinophils Absolute: 0.4 10*3/uL (ref 0.0–0.5)
Eosinophils Relative: 5 %
HCT: 34.6 % — ABNORMAL LOW (ref 39.0–52.0)
Hemoglobin: 11.3 g/dL — ABNORMAL LOW (ref 13.0–17.0)
Immature Granulocytes: 1 %
Lymphocytes Relative: 25 %
Lymphs Abs: 1.9 10*3/uL (ref 0.7–4.0)
MCH: 30.7 pg (ref 26.0–34.0)
MCHC: 32.7 g/dL (ref 30.0–36.0)
MCV: 94 fL (ref 80.0–100.0)
Monocytes Absolute: 0.8 10*3/uL (ref 0.1–1.0)
Monocytes Relative: 11 %
Neutro Abs: 4.3 10*3/uL (ref 1.7–7.7)
Neutrophils Relative %: 57 %
Platelets: 361 10*3/uL (ref 150–400)
RBC: 3.68 MIL/uL — ABNORMAL LOW (ref 4.22–5.81)
RDW: 13.2 % (ref 11.5–15.5)
WBC: 7.5 10*3/uL (ref 4.0–10.5)
nRBC: 0 % (ref 0.0–0.2)

## 2022-03-20 LAB — SYNOVIAL CELL COUNT + DIFF, W/ CRYSTALS
Crystals, Fluid: NONE SEEN
Eosinophils-Synovial: 0 %
Lymphocytes-Synovial Fld: 20 %
Monocyte-Macrophage-Synovial Fluid: 50 %
Neutrophil, Synovial: 30 %
WBC, Synovial: 717 /mm3 — ABNORMAL HIGH (ref 0–200)

## 2022-03-20 LAB — BASIC METABOLIC PANEL
Anion gap: 6 (ref 5–15)
BUN: 12 mg/dL (ref 6–20)
CO2: 25 mmol/L (ref 22–32)
Calcium: 8.5 mg/dL — ABNORMAL LOW (ref 8.9–10.3)
Chloride: 101 mmol/L (ref 98–111)
Creatinine, Ser: 1.05 mg/dL (ref 0.61–1.24)
GFR, Estimated: >60 mL/min (ref >60)
Glucose, Bld: 142 mg/dL — ABNORMAL HIGH (ref 70–99)
Potassium: 3.9 mmol/L (ref 3.5–5.1)
Sodium: 132 mmol/L — ABNORMAL LOW (ref 135–145)

## 2022-03-20 LAB — MAGNESIUM: Magnesium: 2 mg/dL (ref 1.7–2.4)

## 2022-03-20 IMAGING — XA IR FLUORO GUIDE NDL PLMT / BX
1 series · 2 of 2 positions shown · non-contrast
Comparison: none

INDICATION: Left knee pain and effusion, suspicious for septic arthritis

[Series 2: vasc extremity · 2 of 2 slices shown]
[im 1/2]
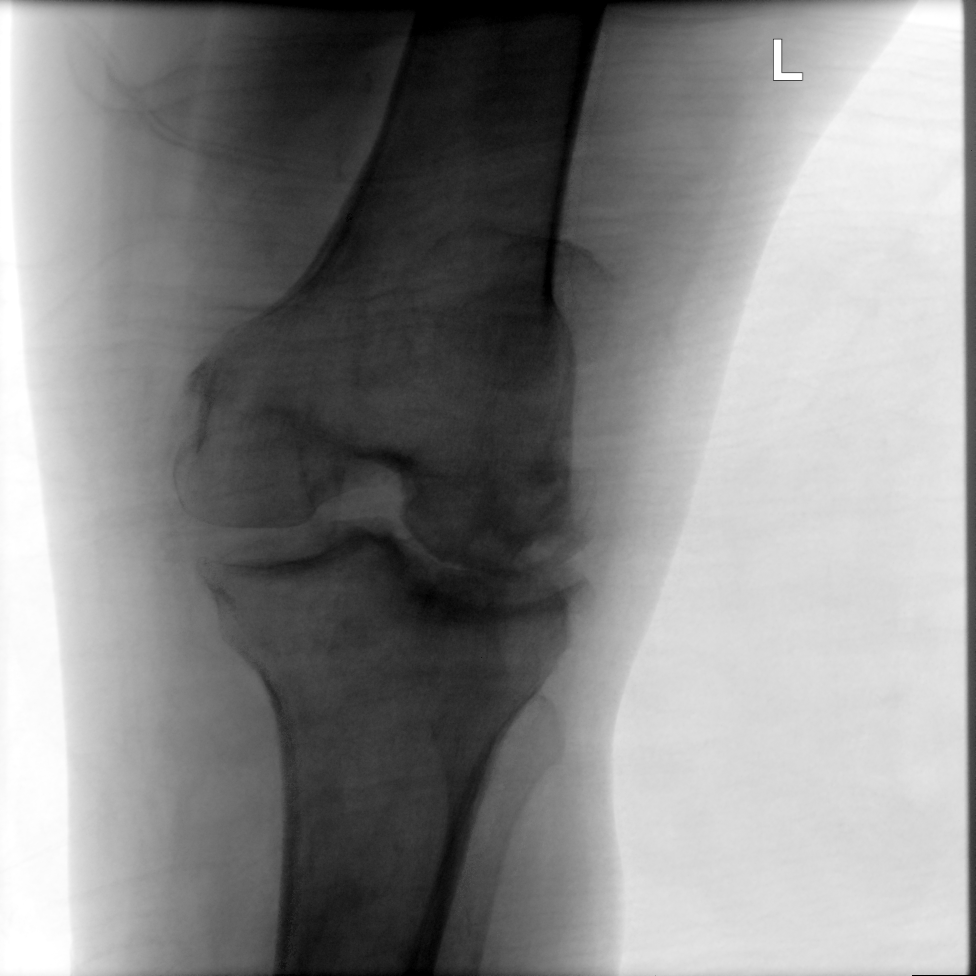
[im 2/2]
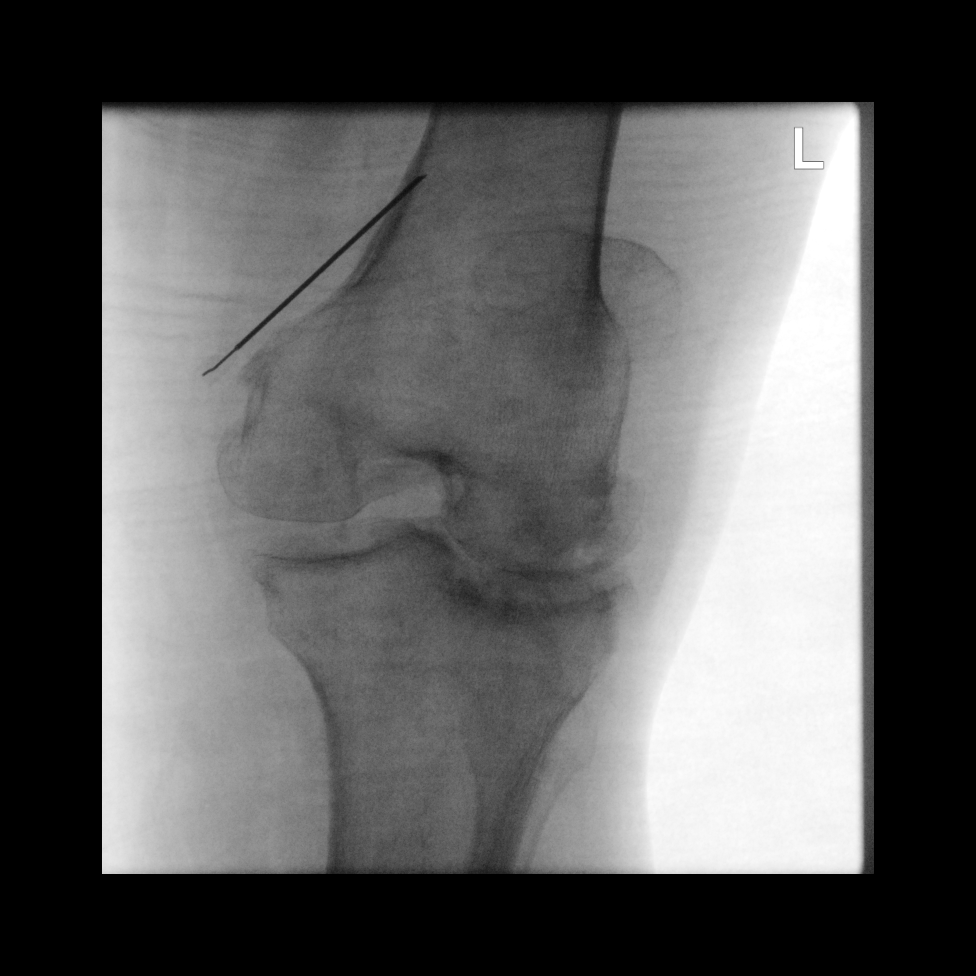

[2 of 2 positions shown; findings below may reference images not displayed]

EXAM:
Fluoroscopy guided left knee joint aspiration

MEDICATIONS:
None.

ANESTHESIA/SEDATION:
None

COMPLICATIONS:
None immediate.

PROCEDURE:
Informed written consent was obtained from the patient after a
thorough discussion of the procedural risks, benefits and
alternatives. All questions were addressed. Maximal Sterile Barrier
Technique was utilized including caps, mask, sterile gowns, sterile
gloves, sterile drape, hand hygiene and skin antiseptic. A timeout
was performed prior to the initiation of the procedure.

Anterior skin of the left knee was prepped and draped in usual
fashion. Following local administration, 18 gauge needle was
advanced into the knee joint capsule. 10 mL of straw-colored fluid
was aspirated and sent for laboratory workup.

Patient on the procedure well without immediate complication.
IMPRESSION: Fluoroscopy guided left knee aspiration.

## 2022-03-20 MED ORDER — NICOTINE 21 MG/24HR TD PT24
21.0000 mg | MEDICATED_PATCH | Freq: Every day | TRANSDERMAL | Status: DC
  Administered 2022-03-20 – 2022-03-21 (×2): 21 mg via TRANSDERMAL
  Filled 2022-03-20 (×2): qty 1

## 2022-03-20 MED ORDER — AMLODIPINE BESYLATE 10 MG PO TABS
10.0000 mg | ORAL_TABLET | Freq: Every day | ORAL | Status: DC
  Administered 2022-03-21: 10 mg via ORAL
  Filled 2022-03-20: qty 1

## 2022-03-20 MED ORDER — LABETALOL HCL 5 MG/ML IV SOLN
10.0000 mg | INTRAVENOUS | Status: DC | PRN
  Administered 2022-03-20: 10 mg via INTRAVENOUS
  Filled 2022-03-20: qty 4

## 2022-03-20 MED ORDER — LIDOCAINE HCL 1 % IJ SOLN
INTRAMUSCULAR | Status: AC
  Administered 2022-03-20: 10 mL
  Filled 2022-03-20: qty 20

## 2022-03-20 MED ORDER — HYDRALAZINE HCL 50 MG PO TABS
25.0000 mg | ORAL_TABLET | ORAL | Status: DC | PRN
Start: 2022-03-20 — End: 2022-03-21

## 2022-03-20 NOTE — Progress Notes (Signed)
Physical Therapy Treatment ?Patient Details ?Name: James Lambert ?MRN: [id number] ?DOB: 1966/03/14 ?Today's Date: 03/20/2022 ? ? ?History of Present Illness Pt is a 56 yo male that presented to the ED for R sided weakness, workup showed new small acute infarct of the left subinsular white matter. PMH of recent admission 03/08/2022 for patchy multifocal acute ischemic infarcts involving the cortical and subcortical left frontal, parietal, and occipital lobes, within additional ischemic infarcts involving the left greater than right thalami and right cerebellum, HTN, asthma. ? ?  ?PT Comments  ? ? Physical Therapy Session completed this date. Upon arrival patient was supine in bed with HOB slightly elevated watching TV. Mild pain from chronic knee pain throughout session. Patient demonstrated Mod I ability to perform all bed mobility, with increased time and effort required due to pain. Sit to stands from an elevated surface were completed at supervision. Patient attempted 1 sit to stand from lowered bed height, however was unable to complete due to increased knee pain. Additional 8 reps from elevated height were completed and x10 seated LAQ B for BLE strengthening. Patient completed 1 lap around the nurses station at SBA/SUP with no AD and no LOB noted. Patient left in bed with all needs met, and would continue to benefit from skilled physical therapy in order to optimize patient's return to PLOF. Continue to recommend HHPT upon discharge from acute hospitalization.  ?  ?Recommendations for follow up therapy are one component of a multi-disciplinary discharge planning process, led by the attending physician.  Recommendations may be updated based on patient status, additional functional criteria and insurance authorization. ? ?Follow Up Recommendations ? Home health PT ?  ?  ?Assistance Recommended at Discharge Intermittent Supervision/Assistance  ?Patient can return home with the following A little help with walking  and/or transfers;A little help with bathing/dressing/bathroom;Assistance with cooking/housework;Direct supervision/assist for medications management ?  ?Equipment Recommendations ? None recommended by PT  ?  ?Recommendations for Other Services   ? ? ?  ?Precautions / Restrictions Precautions ?Precautions: Fall ?Restrictions ?Weight Bearing Restrictions: No  ?  ? ?Mobility ? Bed Mobility ?Overal bed mobility: Modified Independent ?  ?  ?  ?  ?  ?  ?General bed mobility comments: increased time to complete with HOb elevated ?  ? ?Transfers ?Overall transfer level: Needs assistance ?Equipment used: None ?Transfers: Sit to/from Stand ?Sit to Stand: Supervision, From elevated surface ?  ?  ?  ?  ?  ?General transfer comment: increased time to come to standing, x8 additional reps from slightly elevated surface due to B knee pain ?  ? ?Ambulation/Gait ?Ambulation/Gait assistance: Supervision (SBA) ?Gait Distance (Feet): 215 Feet ?Assistive device: None ?Gait Pattern/deviations: Decreased weight shift to right, Decreased dorsiflexion - right, Wide base of support ?Gait velocity: slightly decreased ?  ?  ?General Gait Details: limited today due to bilateral knee pain but no unsteadiness or LOB noted ? ? ?Stairs ?  ?  ?  ?  ?  ? ? ?Wheelchair Mobility ?  ? ?Modified Rankin (Stroke Patients Only) ?  ? ? ?  ?Balance Overall balance assessment: Needs assistance ?Sitting-balance support: No upper extremity supported, Feet supported ?Sitting balance-Leahy Scale: Good ?  ?  ?Standing balance support: No upper extremity supported, During functional activity ?Standing balance-Leahy Scale: Good ?  ?  ?  ?  ?  ?  ?  ?  ?  ?  ?  ?  ?  ? ?  ?Cognition Arousal/Alertness: Awake/alert ?Behavior During Therapy:  WFL for tasks assessed/performed ?Overall Cognitive Status: Within Functional Limits for tasks assessed ?  ?  ?  ?  ?  ?  ?  ?  ?  ?  ?  ?  ?  ?  ?  ?  ?General Comments: Pleasant gentleman ?  ?  ? ?  ?Exercises General Exercises -  Lower Extremity ?Long Arc Quad: Seated, 15 reps, AROM, Both, Strengthening ? ?  ?General Comments General comments (skin integrity, edema, etc.): BP: at beginning of session 151/101, SpO2 reamined >90% throughout session, HR ranged from 74-133bpm throughout session. ?  ?  ? ?Pertinent Vitals/Pain Pain Assessment ?Pain Score: 5  ?Faces Pain Scale: Hurts even more ?Pain Location: BLE knees ?Pain Descriptors / Indicators: Aching ?Pain Intervention(s): Monitored during session, Limited activity within patient's tolerance, Repositioned  ? ? ?Home Living   ?  ?  ?  ?  ?  ?  ?  ?  ?  ?   ?  ?Prior Function    ?  ?  ?   ? ?PT Goals (current goals can now be found in the care plan section) Acute Rehab PT Goals ?Patient Stated Goal: to get some rest ?PT Goal Formulation: With patient ?Time For Goal Achievement: 04/02/22 ?Potential to Achieve Goals: Good ?Progress towards PT goals: Progressing toward goals ? ?  ?Frequency ? ? ? 7X/week ? ? ? ?  ?PT Plan Current plan remains appropriate  ? ? ?Co-evaluation   ?  ?  ?  ?  ? ?  ?AM-PAC PT "6 Clicks" Mobility   ?Outcome Measure ? Help needed turning from your back to your side while in a flat bed without using bedrails?: None ?Help needed moving from lying on your back to sitting on the side of a flat bed without using bedrails?: None ?Help needed moving to and from a bed to a chair (including a wheelchair)?: None ?Help needed standing up from a chair using your arms (e.g., wheelchair or bedside chair)?: None ?Help needed to walk in hospital room?: None ?Help needed climbing 3-5 steps with a railing? : None ?6 Click Score: 24 ? ?  ?End of Session Equipment Utilized During Treatment: Gait belt ?Activity Tolerance: Patient tolerated treatment well;Other (comment) (limited by knee pain) ?Patient left: in bed;with call bell/phone within reach ?Nurse Communication: Mobility status ?PT Visit Diagnosis: Other abnormalities of gait and mobility (R26.89);Unsteadiness on feet (R26.81) ?   ? ? ?Time: 0947-0962 ?PT Time Calculation (min) (ACUTE ONLY): 27 min ? ?Charges:  $Gait Training: 8-22 mins ?$Therapeutic Exercise: 8-22 mins          ?          ?Iva Boop, PT  ?03/20/22. 10:05 AM ? ? ?

## 2022-03-20 NOTE — Progress Notes (Signed)
? ?Date of Admission:  03/18/2022    ? ?ID: James Lambert is a 56 y.o. male  ?Principal Problem: ?  Acute CVA (cerebrovascular accident) (HCC) ?Active Problems: ?  Hypertension ?  Nicotine dependence ?  Hidradenitis suppurativa ?  COPD (chronic obstructive pulmonary disease) (HCC) ? ? ? ?Subjective: ?Doing better ?Had aspiration of left knee ? ?Medications:  ?  stroke: early stages of recovery book   Does not apply Once  ? amLODipine  10 mg Oral Daily  ? amoxicillin-clavulanate  1 tablet Oral Q12H  ? aspirin EC  81 mg Oral Daily  ? atorvastatin  80 mg Oral Daily  ? Chlorhexidine Gluconate Cloth  6 each Topical Q0600  ? clopidogrel  75 mg Oral Daily  ? metoprolol succinate  25 mg Oral Daily  ? nicotine  21 mg Transdermal Daily  ? ? ?Objective: ?Vital signs in last 24 hours: ?Temp:  [98.4 ?F (36.9 ?C)-99.4 ?F (37.4 ?C)] 99.1 ?F (37.3 ?C) (04/13 1157) ?Pulse Rate:  [62-75] 62 (04/13 1157) ?Resp:  [16-20] 20 (04/13 1157) ?BP: (139-176)/(91-132) 171/101 (04/13 1157) ?SpO2:  [98 %-100 %] 100 % (04/13 1157) ? ?PHYSICAL EXAM:  ?General: Alert, cooperative, no distress, appears stated age.  ?Head: Normocephalic, without obvious abnormality, atraumatic. ?Eyes: Conjunctivae clear, anicteric sclerae. Pupils are equal ?ENT Nares normal. No drainage or sinus tenderness. ?Lips, mucosa, and tongue normal. No Thrush ?Neck: Supple, symmetrical, no adenopathy, thyroid: non tender ?no carotid bruit and no JVD. ?Back: No CVA tenderness. ?Lungs: Clear to auscultation bilaterally. No Wheezing or Rhonchi. No rales. ?Heart: Regular rate and rhythm, no murmur, rub or gallop. ?Abdomen: Soft, non-tender,not distended. Bowel sounds normal. No masses ?Extremities: moving left knee better ?Skin:intergluteal and groin lesions ?Lymph: Cervical, supraclavicular normal. ?Neurologic: Grossly non-focal ? ?Lab Results ?Recent Labs  ?  03/18/22 ?1142 03/19/22 ?0334 03/20/22 ?1610  ?WBC 9.2  --  7.5  ?HGB 12.7*  --  11.3*  ?HCT 39.2  --  34.6*  ?NA 132*   --  132*  ?K 4.2  --  3.9  ?CL 100  --  101  ?CO2 21*  --  25  ?BUN 12  --  12  ?CREATININE 1.09 1.20 1.05  ? ?Liver Panel ?No results for input(s): PROT, ALBUMIN, AST, ALT, ALKPHOS, BILITOT, BILIDIR, IBILI in the last 72 hours. ?Sedimentation Rate ?No results for input(s): ESRSEDRATE in the last 72 hours. ?C-Reactive Protein ?No results for input(s): CRP in the last 72 hours. ? ?Synovila fluid cell count- wbc 717 ( 30% N)- not infection ? ?Studies/Results: ?MR KNEE LEFT W WO CONTRAST ? ?Result Date: 03/20/2022 ?CLINICAL DATA:  Chronic left knee pain and swelling. EXAM: MRI OF THE LEFT KNEE WITHOUT AND WITH CONTRAST TECHNIQUE: Multiplanar, multisequence MR imaging of the left knee was performed both before and after administration of intravenous contrast. CONTRAST:  45mL GADAVIST GADOBUTROL 1 MMOL/ML IV SOLN COMPARISON:  Left knee x-rays dated August 14, 2021. FINDINGS: MENISCI Medial meniscus: Small oblique tear at the body/posterior horn junction. Lateral meniscus:  Diffuse degeneration and radial tearing. LIGAMENTS Cruciates: Intact ACL and PCL.  Mucoid degeneration of the ACL. Collaterals: Intact medial collateral ligament and lateral collateral ligament complex. CARTILAGE Patellofemoral: Scattered partial-thickness fissuring and cartilage loss over the patella. High-grade partial-thickness cartilage loss over the superior trochlear groove. Medial: Full-thickness cartilage loss over the peripheral medial femoral condyle with subchondral marrow edema. Lateral: Extensive full-thickness cartilage loss over the lateral femoral condyle and lateral tibial plateau with subchondral marrow edema and  cystic change. MISCELLANEOUS Joint: Moderate joint effusion with irregular synovial thickening and enhancement. Normal Hoffa's fat. Popliteal Fossa: No Baker cyst. Intact popliteus tendon. Extensor Mechanism: Intact quadriceps tendon and patellar tendon. Intact medial and lateral patellar retinaculum. Intact MPFL. Bones: No  acute fracture or dislocation. No suspicious bone lesion. Tricompartmental marginal osteophytes. Other: None. IMPRESSION: 1. Small oblique tear at the body/posterior horn junction of the medial meniscus. 2. Diffuse degeneration and radial tearing of the lateral meniscus. 3. 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 4. Moderate joint effusion with synovitis. Electronically Signed   By: Obie Dredge M.D.   On: 03/20/2022 08:02  ? ?US Venous Img Lower Bilateral (DVT) ? ?Result Date: 03/19/2022 ?CLINICAL DATA:  HISTORY OF CVA, PATENT FORAMEN OVALE EXAM: BILATERAL LOWER EXTREMITY VENOUS DOPPLER ULTRASOUND TECHNIQUE: Gray-scale sonography with compression, as well as color and duplex ultrasound, were performed to evaluate the deep venous system(s) from the level of the common femoral vein through the popliteal and proximal calf veins. COMPARISON:  None. FINDINGS: VENOUS Normal compressibility of the common femoral, superficial femoral, and popliteal veins, as well as the visualized calf veins. Visualized portions of profunda femoral vein and great saphenous vein unremarkable. No filling defects to suggest DVT on grayscale or color Doppler imaging. Doppler waveforms show normal direction of venous flow, normal respiratory plasticity and response to augmentation. Limited views of the contralateral common femoral vein are unremarkable. OTHER None. Limitations: none IMPRESSION: Negative. Electronically Signed   By: Elige Ko M.D.   On: 03/19/2022 09:43   ? ? ?Assessment/Plan: ?56 year old male with history of hypertension and CVA presenting with right hand numbness. ?Recent admission for embolic strokes.  No A-fib or thrombus or endocarditis identified. Small PFO ?  ?Hypertensive emergency on admission- need to r/o secondary causes including renal artery stenosis ?But ECHO does not show LVH ? ?Hidradenitis suppurativa.  Mild inflammation. ?Oral antibiotic should surface.  Culture sent.  Changed  Vanco and cefepime to Augmentin.Improving. will give for 2 weeks and followup with derm at [facility] ?would be hesitant to use clinda + rifampin combo because of risk of cdiff and DDI with rifampin and plavix ?Smoking can aggravate hidradenitis and he has to stop- discussed that with him ? ?Bilateral knee pain and swelling left more than right. ?He has had it for the last 10 years.  Unclear why he should have osteoarthritis.  Need to rule out other inflammatory conditions  including  SAPHO- though he does not have other features like acne, osteitis. ?MRI of left knee showed  moderate joint effusion and synovitis. There tears to medial meniscus, lateral meniscus and tricompartmental OA ?Left knee joint aspirated- no evidence of infection or crystallopathy ? ?Discussed the management with patient in detail ? ? ? ?  ?

## 2022-03-20 NOTE — Progress Notes (Signed)
Subjective:  ?CC: ?James Lambert is a 56 y.o. male  Hospital stay day 1,   CVA, hidradinitis ? ?HPI: ?No acute issues overnight. Pain improving, drainage slowing down ? ?ROS:  ?General: Denies weight loss, weight gain, fatigue, fevers, chills, and night sweats. ?Heart: Denies chest pain, palpitations, racing heart, irregular heartbeat, leg pain or swelling, and decreased activity tolerance. ?Respiratory: Denies breathing difficulty, shortness of breath, wheezing, cough, and sputum. ?GI: Denies change in appetite, heartburn, nausea, vomiting, constipation, diarrhea, and blood in stool. ?GU: Denies difficulty urinating, pain with urinating, urgency, frequency, blood in urine. ? ? ?Objective:  ? ?Temp:  [98.4 ?F (36.9 ?C)-99.4 ?F (37.4 ?C)] 99.1 ?F (37.3 ?C) (04/13 1157) ?Pulse Rate:  [62-75] 62 (04/13 1157) ?Resp:  [16-20] 20 (04/13 1157) ?BP: (139-176)/(91-132) 171/101 (04/13 1157) ?SpO2:  [98 %-100 %] 100 % (04/13 1157)     Height: 6\' 3"  (190.5 cm) Weight: 113.4 kg BMI (Calculated): 31.25  ? ?Intake/Output this shift:  ? ?Intake/Output Summary (Last 24 hours) at 03/20/2022 1357 ?Last data filed at 03/20/2022 1021 ?Gross per 24 hour  ?Intake 840 ml  ?Output 625 ml  ?Net 215 ml  ? ? ?Constitutional :  alert, cooperative, appears stated age, and no distress  ?Respiratory:  clear to auscultation bilaterally  ?Cardiovascular:  regular rate and rhythm  ?Gastrointestinal: soft, non-tender; bowel sounds normal; no masses,  no organomegaly.   ?Skin: Cool and moist. Gluteal, perineal, and groin hidradinitis.  Focal swelling and drainage from right gluteal area has improved in TTP and also degree of swelling.  Persistent purulent drainage in perineal area as well, improved compared to previous exam  ?Psychiatric: Normal affect, non-agitated, not confused  ?   ?  ?LABS:  ? ?  Latest Ref Rng & Units 03/20/2022  ?  3:39 AM 03/19/2022  ?  3:34 AM 03/18/2022  ? 11:42 AM  ?CMP  ?Glucose 70 - 99 mg/dL 05/18/2022    81    ?BUN 6 - 20 mg/dL 12     12    ?Creatinine 0.61 - 1.24 mg/dL 536   4.68   0.32    ?Sodium 135 - 145 mmol/L 132    132    ?Potassium 3.5 - 5.1 mmol/L 3.9    4.2    ?Chloride 98 - 111 mmol/L 101    100    ?CO2 22 - 32 mmol/L 25    21    ?Calcium 8.9 - 10.3 mg/dL 8.5    9.1    ? ? ?  Latest Ref Rng & Units 03/20/2022  ?  3:39 AM 03/18/2022  ? 11:42 AM 03/11/2022  ?  3:58 AM  ?CBC  ?WBC 4.0 - 10.5 K/uL 7.5   9.2   6.1    ?Hemoglobin 13.0 - 17.0 g/dL 05/11/2022   48.2   50.0    ?Hematocrit 39.0 - 52.0 % 34.6   39.2   38.0    ?Platelets 150 - 400 K/uL 361   411   373    ? ? ?RADS: ?N/a ?Assessment:  ? ?Hidradinitis- extensive, but no obvious need for local I&D at this time due to adequate drainage as well as CVA, anti-coag use. ? ?Pt requires long term monitoring, medical management with multidisciplinary team at tertiary care center for his complicated and extensive hidradenitis involving entire gluteal fold area, perineum, and groin.  Recommend urgent referral if feasible. ? ?Ok to switch to oral abx, d/c from surgery standpoint since no sign of  sepsis or worsening. ? ?close outpt f/u with derm or rheum.  recommend going to [facility] ED if he has urgent issues with his hidradenitis so he can be seen by the team over there.  recommend absorbable abdominal pads to be worn around groin/perineum to absorb drainage.  abd pads to be secured with mesh undergarments.  keep area dry and clean with frequent showers.   ? ?labs/images/medications/previous chart entries reviewed personally and relevant changes/updates noted above. ? ? ?

## 2022-03-20 NOTE — Assessment & Plan Note (Addendum)
-   noted on L knee MRI ?- has severe tricompartmental OA ?-Arthrocentesis performed on 03/20/2022 negative for infection ?

## 2022-03-20 NOTE — TOC Initial Note (Signed)
Transition of Care (TOC) - Initial/Assessment Note  ? ? ?Patient Details  ?Name: James Lambert ?MRN: [id number] ?Date of Birth: 14-Feb-1966 ? ?Transition of Care (TOC) CM/SW Contact:    ?Hetty Ely, RN ?Phone Number: ?03/20/2022, 1:51 PM ? ?Clinical Narrative: TOCRN spoke with patient who is alert and oriented x4, currently living with brother, Use [facility] for meds. Public transport for medical visits. No DME, no HH services. Agrees to HHPT/OT, Frances Furbish will provide service. TOC to continue to track for discharge needs.               ? ? ?Expected Discharge Plan: Home w Home Health Services ?Barriers to Discharge: Continued Medical Work up ? ? ?Patient Goals and CMS Choice ?Patient states their goals for this hospitalization and ongoing recovery are:: To return home. ?CMS Medicare.gov Compare Post Acute Care list provided to:: Patient ?Choice offered to / list presented to : Patient ? ?Expected Discharge Plan and Services ?Expected Discharge Plan: Home w Home Health Services ?  ?  ?  ?Living arrangements for the past 2 months: Apartment ?                ?  ?  ?  ?  ?  ?HH Arranged: PT, OT ?HH Agency: [facility] ?Date HH Agency Contacted: 03/20/22 ?Time HH Agency Contacted: 1100 ?Representative spoke with at [facility] Agency: Denyse Amass ? ?Prior Living Arrangements/Services ?Living arrangements for the past 2 months: Apartment ?Lives with:: Relatives ?Patient language and need for interpreter reviewed:: Yes ?Do you feel safe going back to the place where you live?: Yes      ?Need for Family Participation in Patient Care: No (Comment) ?Care giver support system in place?: Yes (comment) ?  ?Criminal Activity/Legal Involvement Pertinent to Current Situation/Hospitalization: No - Comment as needed ? ?Activities of Daily Living ?Home Assistive Devices/Equipment: None ?ADL Screening (condition at time of admission) ?Patient's cognitive ability adequate to safely complete daily activities?: Yes ?Is the  patient deaf or have difficulty hearing?: No ?Does the patient have difficulty seeing, even when wearing glasses/contacts?: No ?Does the patient have difficulty concentrating, remembering, or making decisions?: No ?Patient able to express need for assistance with ADLs?: Yes ?Does the patient have difficulty dressing or bathing?: No ?Independently performs ADLs?: Yes (appropriate for developmental age) ?Does the patient have difficulty walking or climbing stairs?: No ?Weakness of Legs: None ?Weakness of Arms/Hands: Right ? ?Permission Sought/Granted ?  ?  ?   ?   ?   ?   ? ?Emotional Assessment ?Appearance:: Appears stated age ?Attitude/Demeanor/Rapport: Engaged ?Affect (typically observed): Accepting ?Orientation: : Oriented to Self, Oriented to Place, Oriented to  Time, Oriented to Situation ?Alcohol / Substance Use: Not Applicable ?Psych Involvement: No (comment) ? ?Admission diagnosis:  Cellulitis of multiple sites of buttock [L03.317] ?Acute CVA (cerebrovascular accident) (HCC) [I63.9] ?Acute stroke due to ischemia [facility]) [I63.9] ?Patient Active Problem List  ? Diagnosis Date Noted  ? Hypertension   ? Nicotine dependence   ? Hidradenitis suppurativa   ? COPD (chronic obstructive pulmonary disease) (HCC)   ? Acute CVA (cerebrovascular accident) (HCC) 03/08/2022  ? ?PCP:  Pcp, No ?Pharmacy:   ?[facility] ?[address] ?[place] [address] ?Phone: [phone] Fax: [phone] ? ? ? ? ?Social Determinants of Health (SDOH) Interventions ?  ? ?Readmission Risk Interventions ?   ? View : No data to display.  ?  ?  ?  ? ? ? ?

## 2022-03-20 NOTE — Progress Notes (Addendum)
Occupational Therapy Treatment ?Patient Details ?Name: James Lambert ?MRN: [id number] ?DOB: 07-Feb-1966 ?Today's Date: 03/20/2022 ? ? ?History of present illness Pt is a 56 yo male that presented to the ED for R sided weakness, workup showed new small acute infarct of the left subinsular white matter. PMH of recent admission 03/08/2022 for patchy multifocal acute ischemic infarcts involving the cortical and subcortical left frontal, parietal, and occipital lobes, within additional ischemic infarcts involving the left greater than right thalami and right cerebellum, HTN, asthma. ?  ?OT comments ? Mr Gras was seen for OT treatment on this date. Upon arrival to room pt reclined in bed, agreeable to tx. MOD I bed mobility. Pt worked on flipping cards while alternating directions and worked on progressively increasing the speed while maintaining control and accuracy sorting by suit. Discussed HEP and d/c recs, pt reports using RUE throughout day. Pt making good progress toward goals. Pt continues to benefit from skilled OT services to maximize return to PLOF and minimize risk of future falls, injury, caregiver burden, and readmission. Will continue to follow POC. Discharge recommendation remains appropriate.  ?  ? ?Recommendations for follow up therapy are one component of a multi-disciplinary discharge planning process, led by the attending physician.  Recommendations may be updated based on patient status, additional functional criteria and insurance authorization. ?   ?Follow Up Recommendations ? Home health OT  ?  ?Assistance Recommended at Discharge Intermittent Supervision/Assistance  ?Patient can return home with the following ? A little help with walking and/or transfers;A little help with bathing/dressing/bathroom;Assistance with cooking/housework;Assist for transportation ?  ?Equipment Recommendations ? None recommended by OT  ?  ?Recommendations for Other Services   ? ?  ?Precautions / Restrictions  Precautions ?Precautions: Fall ?Restrictions ?Weight Bearing Restrictions: No  ? ? ?  ? ?Mobility Bed Mobility ?Overal bed mobility: Modified Independent ?  ?  ?  ?  ?  ?  ?  ?  ? ? ?  ?  ?  ?  ?  ?  ?  ?  ?  ?  ?  ?Balance Overall balance assessment: Needs assistance ?Sitting-balance support: No upper extremity supported, Feet supported ?Sitting balance-Leahy Scale: Normal ?  ?  ?  ?  ?  ?  ?  ?  ?  ?  ?  ?  ?  ?  ?  ?  ?   ? ?ADL either performed or assessed with clinical judgement  ? ?ADL Overall ADL's : Needs assistance/impaired ?  ?  ?  ?  ?  ?  ?  ?  ?  ?  ?  ?  ?  ?  ?  ?  ?  ?  ?  ?General ADL Comments: SUPERVISION dynamic sitting tasks ?  ? ? ? ?Cognition Arousal/Alertness: Awake/alert ?Behavior During Therapy: [facility] for tasks assessed/performed ?Overall Cognitive Status: Within Functional Limits for tasks assessed ?  ?  ?  ?  ?  ?  ?  ?  ?  ?  ?  ?  ?  ?  ?  ?  ?  ?  ?  ?   ?Exercises Other Exercises ?Other Exercises: cards sorting activity for speed and accuracy ? ?  ?   ?   ? ? ?Pertinent Vitals/ Pain       Pain Assessment ?Pain Assessment: Faces ?Faces Pain Scale: Hurts little more ?Pain Location: L knee ?Pain Descriptors / Indicators: Aching ?Pain Intervention(s): Limited activity within patient's tolerance, Repositioned ? ? ?  Frequency ? Min 3X/week  ? ? ? ? ?  ?Progress Toward Goals ? ?OT Goals(current goals can now be found in the care plan section) ? Progress towards OT goals: Progressing toward goals ? ?Acute Rehab OT Goals ?Patient Stated Goal: to go home ?OT Goal Formulation: With patient ?Time For Goal Achievement: 04/02/22 ?Potential to Achieve Goals: Good ?ADL Goals ?Pt Will Perform Grooming: with modified independence;standing ?Pt Will Perform Lower Body Dressing: with modified independence;sit to/from stand ?Pt Will Transfer to Toilet: with modified independence;ambulating ?Pt Will Perform Toileting - Clothing Manipulation and hygiene: with modified independence;sit to/from stand  ?Plan  Discharge plan remains appropriate;Frequency remains appropriate   ? ?Co-evaluation ? ? ?   ?  ?  ?  ?  ? ?  ?AM-PAC OT "6 Clicks" Daily Activity     ?Outcome Measure ? ? Help from another person eating meals?: None ?Help from another person taking care of personal grooming?: None ?Help from another person toileting, which includes using toliet, bedpan, or urinal?: A Little ?Help from another person bathing (including washing, rinsing, drying)?: A Little ?Help from another person to put on and taking off regular upper body clothing?: A Little ?Help from another person to put on and taking off regular lower body clothing?: A Little ?6 Click Score: 20 ? ?  ?End of Session   ? ?OT Visit Diagnosis: Unsteadiness on feet (R26.81);Hemiplegia and hemiparesis ?Hemiplegia - Right/Left: Right ?Hemiplegia - dominant/non-dominant: Dominant ?Hemiplegia - caused by: Cerebral infarction ?  ?Activity Tolerance Patient tolerated treatment well ?  ?Patient Left in bed;with call bell/phone within reach ?  ?Nurse Communication   ?  ? ?   ? ?Time: 3500-9381 ?OT Time Calculation (min): 12 min ? ?Charges: OT General Charges ?$OT Visit: 1 Visit ?OT Treatments ?$Therapeutic Activity: 8-22 mins ? ?Kathie Dike, M.S. OTR/L  ?03/20/22, 2:34 PM  ?ascom [phone] ? ?

## 2022-03-20 NOTE — Progress Notes (Signed)
?Progress Note ? ? ? ?James Lambert   ?QMG:[id number]  ?DOB: 1966-03-07  ?DOA: 03/18/2022     1 ?PCP: Pcp, No ? ?Initial CC: right arm numbness, wound on sacrum ? ?Hospital Course: ?James Lambert is a 56 year old male with PMH recent CVA with residual right sided weakness, HTN, COPD, hidradenitis suppurativa who presented with right arm paresthesias.  ?He was hospitalized 4/1 - 4/4 for an acute CVA which showed patchy multifocal acute ischemic infarcts bilaterally with concern for embolic phenomenon.  He had undergone full stroke work-up including TEE which showed a small bidirectional PFO.  He was recommended for following up with cardiology outpatient for placement of a loop recorder. ?He was also continued on aspirin and Plavix at discharge however he was unable to afford aspirin and was only on Plavix. ?He underwent repeat MRI brain on admission which showed a new small acute infarct of the left subinsular white matter. ? ?In addition, he was also having worsening pain and purulent drainage associated with his hidradenitis near his sacrum/perineum. He was started on Vanc/zosyn and also evaluated by general surgery.  ? ?Interval History:  ?No events overnight.  Pain in his gluteal region has been steadily improving since admission.  We discussed plan for obtaining left knee arthrocentesis to rule out infection.  Reviewed findings of MRI knee with him as well. ? ?Assessment and Plan: ?* Acute CVA (cerebrovascular accident) (HCC) ?- recent CVA on 4/1 (bilateral with concern for emboli) ?- recommended to follow up outpt with cardiology for ILR; will reach out while hospitalized in case able to place any monitoring prior to discharge ?- LE Duplex on 4/12 negative for DVT ?- no formal neuro eval needed given full stroke workup recently; appreciate note placed on 4/12 by neurology ?- continue asa and plavix for 90 days then monotherapy Plavix (spoke with med management, they will be able to provide both at  discharge) ? ?Hidradenitis suppurativa ?- [name] and has an acute flareup ?- not a surgical candidate per surgery given acute CVA; for now trying to treat medically with abx ?-Evaluated by general surgery and ID, appreciate assistance ?- Antibiotics de-escalated to Augmentin to complete course ?- Possible outpatient referral to dermatology versus rheumatology ? ?Effusion of left knee joint ?- noted on L knee MRI ?- has severe tricompartmental OA ?- suspect effusion is inflammatory in etiology but given hidradenitis will see if able to aspirate for ruling out infection  ? ?Nicotine dependence ?Smoking cessation has been discussed with patient in detail ?-Patient now amenable for nicotine patch ? ?Hypertension ?- Okay to start lowering blood pressure at this time ?- Continue metoprolol ?- Resume amlodipine ?- Use labetalol or hydralazine PRN as well ? ?COPD (chronic obstructive pulmonary disease) (HCC) ?Not acutely exacerbated ?Continue as needed bronchodilator therapy ? ? ? ?Old records reviewed in assessment of this patient ? ?Antimicrobials: ?Vancomycin 03/18/2022 >> 03/19/2022 ?Zosyn 03/18/2022 >> 03/19/2022 ?Augmentin 03/19/2022 >> current ? ?DVT prophylaxis:  ?SCD's Start: 03/18/22 1625 ? ? ?Code Status:   Code Status: Full Code ? ?Disposition Plan:  Home tomorrow (Friday) ?Status is: Inpt ? ?Objective: ?Blood pressure (!) 171/101, pulse 62, temperature 99.1 ?F (37.3 ?C), resp. rate 20, height 6\' 3"  (1.905 m), weight 113.4 kg, SpO2 100 %.  ?Examination:  ?Physical Exam ?Constitutional:   ?   General: He is not in acute distress. ?   Appearance: Normal appearance.  ?HENT:  ?   Head: Normocephalic and atraumatic.  ?  Mouth/Throat:  ?   Mouth: Mucous membranes are moist.  ?Eyes:  ?   Extraocular Movements: Extraocular movements intact.  ?Cardiovascular:  ?   Rate and Rhythm: Normal rate and regular rhythm.  ?   Heart sounds: Normal heart sounds.  ?Pulmonary:  ?   Effort:  Pulmonary effort is normal. No respiratory distress.  ?   Breath sounds: Normal breath sounds. No wheezing.  ?Abdominal:  ?   General: Bowel sounds are normal. There is no distension.  ?   Palpations: Abdomen is soft.  ?   Tenderness: There is no abdominal tenderness.  ?Musculoskeletal:     ?   General: Normal range of motion.  ?   Cervical back: Normal range of motion and neck supple.  ?Skin: ?   Comments: Dressing in place over gluteal region  ?Neurological:  ?   Mental Status: He is alert.  ?   Comments: 4+/5 in RUE  ?Psychiatric:     ?   Mood and Affect: Mood normal.     ?   Behavior: Behavior normal.  ?  ? ?Consultants:  ?General surgery ? ?Procedures:  ? ? ?Data Reviewed: ?Results for orders placed or performed during the hospital encounter of 03/18/22 (from the past 24 hour(s))  ?Aerobic Culture w Gram Stain (superficial specimen)     Status: None (Preliminary result)  ? Collection Time: 03/19/22  4:14 PM  ? Specimen: Groin; Wound  ?Result Value Ref Range  ? Specimen Description    ?  GROIN ?Performed at [facility], [address] [place], [place] [address] ?  ? Special Requests    ?  NONE ?Performed at [facility], [address] [place], [place] [address] ?  ? Gram Stain PENDING   ? Culture    ?  NO GROWTH < 12 HOURS ?Performed at [facility], [address] [place], [place] [address] ?  ? Report Status PENDING   ?Basic metabolic panel     Status: Abnormal  ? Collection Time: 03/20/22  3:39 AM  ?Result Value Ref Range  ? Sodium 132 (L) 135 - 145 mmol/L  ? Potassium 3.9 3.5 - 5.1 mmol/L  ? Chloride 101 98 - 111 mmol/L  ? CO2 25 22 - 32 mmol/L  ? Glucose, Bld 142 (H) 70 - 99 mg/dL  ? BUN 12 6 - 20 mg/dL  ? Creatinine, Ser 1.05 0.61 - 1.24 mg/dL  ? Calcium 8.5 (L) 8.9 - 10.3 mg/dL  ? GFR, Estimated >60 >60 mL/min  ? Anion gap 6 5 - 15  ?CBC with Differential/Platelet     Status: Abnormal  ? Collection Time: 03/20/22  3:39 AM  ?Result Value Ref Range  ? WBC 7.5 4.0 - 10.5 K/uL   ? RBC 3.68 (L) 4.22 - 5.81 MIL/uL  ? Hemoglobin 11.3 (L) 13.0 - 17.0 g/dL  ? HCT 34.6 (L) 39.0 - 52.0 %  ? MCV 94.0 80.0 - 100.0 fL  ? MCH 30.7 26.0 - 34.0 pg  ? MCHC 32.7 30.0 - 36.0 g/dL  ? RDW 13.2 11.5 - 15.5 %  ? Platelets 361 150 - 400 K/uL  ? nRBC 0.0 0.0 - 0.2 %  ? Neutrophils Relative % 57 %  ? Neutro Abs 4.3 1.7 - 7.7 K/uL  ? Lymphocytes Relative 25 %  ? Lymphs Abs 1.9 0.7 - 4.0 K/uL  ? Monocytes Relative 11 %  ? Monocytes Absolute 0.8 0.1 - 1.0 K/uL  ? Eosinophils Relative  5 %  ? Eosinophils Absolute 0.4 0.0 - 0.5 K/uL  ? Basophils Relative 1 %  ? Basophils Absolute 0.0 0.0 - 0.1 K/uL  ? Immature Granulocytes 1 %  ? Abs Immature Granulocytes 0.06 0.00 - 0.07 K/uL  ?Magnesium     Status: None  ? Collection Time: 03/20/22  3:39 AM  ?Result Value Ref Range  ? Magnesium 2.0 1.7 - 2.4 mg/dL  ?  ?I have Reviewed nursing notes, Vitals, and Lab results since pt's last encounter. Pertinent lab results : see above ?I have ordered test including BMP, CBC, Mg ?I have reviewed the last note from staff over past 24 hours ?I have discussed pt's care plan and test results with nursing staff, case manager ? ? LOS: 1 day  ? ?James Chamber, MD ?[facility] ?03/20/2022, 3:27 PM ? ?

## 2022-03-21 ENCOUNTER — Other Ambulatory Visit: Payer: Self-pay

## 2022-03-21 DIAGNOSIS — I639 Cerebral infarction, unspecified: Secondary | ICD-10-CM | POA: Diagnosis not present

## 2022-03-21 DIAGNOSIS — M25462 Effusion, left knee: Secondary | ICD-10-CM | POA: Diagnosis not present

## 2022-03-21 DIAGNOSIS — S83289A Other tear of lateral meniscus, current injury, unspecified knee, initial encounter: Secondary | ICD-10-CM

## 2022-03-21 DIAGNOSIS — L732 Hidradenitis suppurativa: Secondary | ICD-10-CM | POA: Diagnosis not present

## 2022-03-21 LAB — CBC WITH DIFFERENTIAL/PLATELET
Abs Immature Granulocytes: 0.05 10*3/uL (ref 0.00–0.07)
Basophils Absolute: 0 10*3/uL (ref 0.0–0.1)
Basophils Relative: 0 %
Eosinophils Absolute: 0.4 10*3/uL (ref 0.0–0.5)
Eosinophils Relative: 6 %
HCT: 33.3 % — ABNORMAL LOW (ref 39.0–52.0)
Hemoglobin: 10.9 g/dL — ABNORMAL LOW (ref 13.0–17.0)
Immature Granulocytes: 1 %
Lymphocytes Relative: 23 %
Lymphs Abs: 1.7 10*3/uL (ref 0.7–4.0)
MCH: 30.9 pg (ref 26.0–34.0)
MCHC: 32.7 g/dL (ref 30.0–36.0)
MCV: 94.3 fL (ref 80.0–100.0)
Monocytes Absolute: 0.9 10*3/uL (ref 0.1–1.0)
Monocytes Relative: 12 %
Neutro Abs: 4.2 10*3/uL (ref 1.7–7.7)
Neutrophils Relative %: 58 %
Platelets: 371 10*3/uL (ref 150–400)
RBC: 3.53 MIL/uL — ABNORMAL LOW (ref 4.22–5.81)
RDW: 13.2 % (ref 11.5–15.5)
WBC: 7.2 10*3/uL (ref 4.0–10.5)
nRBC: 0 % (ref 0.0–0.2)

## 2022-03-21 LAB — BASIC METABOLIC PANEL
Anion gap: 7 (ref 5–15)
BUN: 10 mg/dL (ref 6–20)
CO2: 25 mmol/L (ref 22–32)
Calcium: 8.4 mg/dL — ABNORMAL LOW (ref 8.9–10.3)
Chloride: 100 mmol/L (ref 98–111)
Creatinine, Ser: 0.89 mg/dL (ref 0.61–1.24)
GFR, Estimated: >60 mL/min (ref >60)
Glucose, Bld: 142 mg/dL — ABNORMAL HIGH (ref 70–99)
Potassium: 3.6 mmol/L (ref 3.5–5.1)
Sodium: 132 mmol/L — ABNORMAL LOW (ref 135–145)

## 2022-03-21 LAB — MAGNESIUM: Magnesium: 1.9 mg/dL (ref 1.7–2.4)

## 2022-03-21 MED ORDER — AMOXICILLIN-POT CLAVULANATE 875-125 MG PO TABS
1.0000 | ORAL_TABLET | Freq: Two times a day (BID) | ORAL | 0 refills | Status: DC
  Filled 2022-03-21: qty 26, 13d supply, fill #0

## 2022-03-21 MED ORDER — AMLODIPINE BESYLATE 10 MG PO TABS
10.0000 mg | ORAL_TABLET | Freq: Every day | ORAL | 3 refills | Status: DC
  Filled 2022-03-21 – 2022-04-30 (×2): qty 30, 30d supply, fill #0

## 2022-03-21 NOTE — Discharge Instructions (Addendum)
Continue absorbable abdominal pads to be worn around groin/between legs to absorb drainage.  Pads to be secured with mesh undergarments.  Keep area dry and clean with frequent showers.   ?

## 2022-03-21 NOTE — Progress Notes (Signed)
Occupational Therapy Treatment ?Patient Details ?Name: James Lambert ?MRN: [id number] ?DOB: 1966/01/27 ?Today's Date: 03/21/2022 ? ? ?History of present illness Pt is a 56 yo male that presented to the ED for R sided weakness, workup showed new small acute infarct of the left subinsular white matter. PMH of recent admission 03/08/2022 for patchy multifocal acute ischemic infarcts involving the cortical and subcortical left frontal, parietal, and occipital lobes, within additional ischemic infarcts involving the left greater than right thalami and right cerebellum, HTN, asthma. ?  ?OT comments ? Chart reviewed, pt greeted in bed agreeable to OT tx session. Education provided re: home safety, ADL completion with pt demonstrating fair-good carry over during ADL task completion. Pt completes UB/LB dressing with SUPERVISION, intermittent vcs required during LB dressing for safe technique during STS. Opening noted on sacrum, RN aware.Pt is left seated at EOB, NAD, all needs met. Discharge recommendation remains appropriate.   ? ?Recommendations for follow up therapy are one component of a multi-disciplinary discharge planning process, led by the attending physician.  Recommendations may be updated based on patient status, additional functional criteria and insurance authorization. ?   ?Follow Up Recommendations ? Home health OT  ?  ?Assistance Recommended at Discharge Intermittent Supervision/Assistance  ?Patient can return home with the following ? A little help with walking and/or transfers;A little help with bathing/dressing/bathroom;Assistance with cooking/housework;Assist for transportation ?  ?Equipment Recommendations ? None recommended by OT  ?  ?Recommendations for Other Services   ? ?  ?Precautions / Restrictions Precautions ?Precautions: Fall ?Restrictions ?Weight Bearing Restrictions: No  ? ? ?  ? ?Mobility Bed Mobility ?Overal bed mobility: Modified Independent ?  ?  ?  ?  ?  ?  ?General bed mobility comments:  HOB elevated ?  ? ?Transfers ?Overall transfer level: Needs assistance ?Equipment used: None ?Transfers: Sit to/from Stand ?Sit to Stand: Supervision, From elevated surface ?  ?  ?  ?  ?  ?  ?  ?  ?Balance Overall balance assessment: Needs assistance ?Sitting-balance support: No upper extremity supported, Feet supported ?Sitting balance-Leahy Scale: Good ?  ?  ?Standing balance support: No upper extremity supported, During functional activity ?  ?Standing balance comment: supervision for LB dressing STS ?  ?  ?  ?  ?  ?  ?  ?  ?  ?  ?  ?   ? ?ADL either performed or assessed with clinical judgement  ? ?ADL Overall ADL's : Needs assistance/impaired ?  ?  ?  ?  ?  ?  ?  ?  ?Upper Body Dressing : Set up;Sitting ?  ?Lower Body Dressing: Supervision/safety;Sit to/from stand ?Lower Body Dressing Details (indicate cue type and reason): underwear and pants ?Toilet Transfer: Supervision/safety ?Toilet Transfer Details (indicate cue type and reason): simulated to and from bed ?Toileting- Clothing Manipulation and Hygiene: Supervision/safety;Sit to/from stand ?Toileting - Clothing Manipulation Details (indicate cue type and reason): underwear ?  ?  ?  ?  ?  ? ?Extremity/Trunk Assessment   ?  ?  ?  ?  ?  ? ?Vision   ?  ?  ?Perception   ?  ?Praxis   ?  ? ?Cognition Arousal/Alertness: Awake/alert ?Behavior During Therapy: [facility] for tasks assessed/performed ?Overall Cognitive Status: Within Functional Limits for tasks assessed ?  ?  ?  ?  ?  ?  ?  ?  ?  ?  ?  ?  ?  ?  ?  ?  ?  ?  ?  ?   ?  Exercises Other Exercises ?Other Exercises: edu re: role of OT, role of rehab, home safey ? ?  ?Shoulder Instructions   ? ? ?  ?General Comments opening noted on sacral area and bleeding with bed pad slightly soiled, RN notified and aware  ? ? ?Pertinent Vitals/ Pain       Pain Assessment ?Pain Assessment: Faces ?Faces Pain Scale: Hurts a little bit ?Pain Location: generalized ?Pain Descriptors / Indicators: Discomfort ?Pain Intervention(s): Limited  activity within patient's tolerance, Monitored during session, Repositioned ? ?Home Living   ?  ?  ?  ?  ?  ?  ?  ?  ?  ?  ?  ?  ?  ?  ?  ?  ?  ?  ? ?  ?Prior Functioning/Environment    ?  ?  ?  ?   ? ?Frequency ? Min 3X/week  ? ? ? ? ?  ?Progress Toward Goals ? ?OT Goals(current goals can now be found in the care plan section) ? Progress towards OT goals: Progressing toward goals ? ?Acute Rehab OT Goals ?Patient Stated Goal: go home ?OT Goal Formulation: With patient ?Time For Goal Achievement: 04/04/22 ?Potential to Achieve Goals: Good  ?Plan Discharge plan remains appropriate;Frequency remains appropriate   ? ?Co-evaluation ? ? ?   ?  ?  ?  ?  ? ?  ?AM-PAC OT "6 Clicks" Daily Activity     ?Outcome Measure ? ? Help from another person eating meals?: None ?Help from another person taking care of personal grooming?: None ?Help from another person toileting, which includes using toliet, bedpan, or urinal?: None ?Help from another person bathing (including washing, rinsing, drying)?: A Little ?Help from another person to put on and taking off regular upper body clothing?: None ?Help from another person to put on and taking off regular lower body clothing?: None ?6 Click Score: 23 ? ?  ?End of Session   ? ?OT Visit Diagnosis: Unsteadiness on feet (R26.81);Hemiplegia and hemiparesis ?Hemiplegia - Right/Left: Right ?Hemiplegia - dominant/non-dominant: Dominant ?Hemiplegia - caused by: Cerebral infarction ?  ?Activity Tolerance Patient tolerated treatment well ?  ?Patient Left in bed;with call bell/phone within reach ?  ?Nurse Communication Mobility status ?  ? ?   ? ?Time: 4967-5916 ?OT Time Calculation (min): 14 min ? ?Charges: OT General Charges ?$OT Visit: 1 Visit ?OT Treatments ?$Self Care/Home Management : 8-22 mins ? ?Shanon Payor, OTD OTR/L  ?03/21/22, 11:48 AM  ?

## 2022-03-21 NOTE — Progress Notes (Signed)
Physical Therapy Treatment ?Patient Details ?Name: James Lambert ?MRN: [id number] ?DOB: 1966/03/09 ?Today's Date: 03/21/2022 ? ? ?History of Present Illness Pt is a 56 yo male that presented to the ED for R sided weakness, workup showed new small acute infarct of the left subinsular white matter. PMH of recent admission 03/08/2022 for patchy multifocal acute ischemic infarcts involving the cortical and subcortical left frontal, parietal, and occipital lobes, within additional ischemic infarcts involving the left greater than right thalami and right cerebellum, HTN, asthma. ? ?  ?PT Comments  ? ? Physical Therapy Treatment completed this date. Patient tolerated session well and was agreeable to treatment. Upon arrival patient was supine in bed watching TV. Mild pain in bilateral knees, limiting physical activity. Patient continues to demonstrate Mod I with all bed mobility. SUP with no AD for sit to stand transfers due to increased unsteadiness due to stiffness/pain in B knees.  No LOB noted, however patient does require increased time to come to standing. Patient demonstrated increased distance during ambulation bout, ambulating at SUP from room>therapy room> ascended/descended steps>back to room (~351ft). Ascending and descending steps completed at SUP with bilateral handrails, and completed through a step to pattern. Although completed awkwardly due to knee pain, patient appeared safe. Patient was left in room sitting EOB chatting with MD. Patient would continue to benefit from skilled physical therapy in order to optimize patient's return to PLOF. Continue to recommend HHPT upon discharge from acute hospitalization. ?   ?Recommendations for follow up therapy are one component of a multi-disciplinary discharge planning process, led by the attending physician.  Recommendations may be updated based on patient status, additional functional criteria and insurance authorization. ? ?Follow Up Recommendations ? Home health  PT ?  ?  ?Assistance Recommended at Discharge Intermittent Supervision/Assistance  ?Patient can return home with the following A little help with walking and/or transfers;A little help with bathing/dressing/bathroom;Assistance with cooking/housework;Direct supervision/assist for medications management ?  ?Equipment Recommendations ? None recommended by PT  ?  ?Recommendations for Other Services   ? ? ?  ?Precautions / Restrictions Precautions ?Precautions: Fall ?Restrictions ?Weight Bearing Restrictions: No  ?  ? ?Mobility ? Bed Mobility ?Overal bed mobility: Modified Independent ?  ?  ?  ?  ?  ?  ?General bed mobility comments: increased time due to bilateral knee pain, HOB slightly elevated ?  ? ?Transfers ?Overall transfer level: Needs assistance ?Equipment used: None ?Transfers: Sit to/from Stand ?Sit to Stand: Supervision, From elevated surface ?  ?  ?  ?  ?  ?  ?  ? ?Ambulation/Gait ?Ambulation/Gait assistance: Supervision ?Gait Distance (Feet): 390 Feet ?Assistive device: None ?Gait Pattern/deviations: Decreased weight shift to right, Decreased dorsiflexion - right, Wide base of support ?Gait velocity: slightly decreased ?  ?  ?General Gait Details: limited due to bilateral knee pain but no unsteadiness or LOB noted ? ? ?Stairs ?Stairs: Yes ?Stairs assistance: Supervision ?Stair Management: Two rails, Step to pattern ?Number of Stairs: 4 ?General stair comments: performs in a step to pattern, performed awkwardly, however appears safe ? ? ?Wheelchair Mobility ?  ? ?Modified Rankin (Stroke Patients Only) ?  ? ? ?  ?Balance Overall balance assessment: Needs assistance ?Sitting-balance support: No upper extremity supported, Feet supported ?Sitting balance-Leahy Scale: Normal ?  ?  ?Standing balance support: No upper extremity supported, During functional activity ?Standing balance-Leahy Scale: Good ?  ?  ?  ?  ?  ?  ?  ?  ?  ?  ?  ?  ?  ? ?  ?  Cognition Arousal/Alertness: Awake/alert ?Behavior During Therapy: [facility]  for tasks assessed/performed ?Overall Cognitive Status: Within Functional Limits for tasks assessed ?  ?  ?  ?  ?  ?  ?  ?  ?  ?  ?  ?  ?  ?  ?  ?  ?General Comments: Pleasant gentleman ?  ?  ? ?  ?Exercises Other Exercises ?Other Exercises: x10 sit to stands from elevated surface due to B knee pain ? ?  ?General Comments   ?  ?  ? ?Pertinent Vitals/Pain Pain Assessment ?Pain Assessment: 0-10 ?Pain Score: 3  ?Faces Pain Scale: Hurts little more ?Pain Location: L knee ?Pain Descriptors / Indicators: Aching ?Pain Intervention(s): Monitored during session, Limited activity within patient's tolerance, Repositioned  ? ? ?Home Living   ?  ?  ?  ?  ?  ?  ?  ?  ?  ?   ?  ?Prior Function    ?  ?  ?   ? ?PT Goals (current goals can now be found in the care plan section) Acute Rehab PT Goals ?Patient Stated Goal: to get some rest ?PT Goal Formulation: With patient ?Time For Goal Achievement: 04/02/22 ?Potential to Achieve Goals: Good ?Progress towards PT goals: Progressing toward goals ? ?  ?Frequency ? ? ? 7X/week ? ? ? ?  ?PT Plan Current plan remains appropriate  ? ? ?Co-evaluation   ?  ?  ?  ?  ? ?  ?AM-PAC PT "6 Clicks" Mobility   ?Outcome Measure ? Help needed turning from your back to your side while in a flat bed without using bedrails?: None ?Help needed moving from lying on your back to sitting on the side of a flat bed without using bedrails?: None ?Help needed moving to and from a bed to a chair (including a wheelchair)?: None ?Help needed standing up from a chair using your arms (e.g., wheelchair or bedside chair)?: None ?Help needed to walk in hospital room?: None ?Help needed climbing 3-5 steps with a railing? : None ?6 Click Score: 24 ? ?  ?End of Session Equipment Utilized During Treatment: Gait belt ?Activity Tolerance: Patient tolerated treatment well;Other (comment) (limited by knee pain) ?Patient left: with call bell/phone within reach;in bed ?Nurse Communication: Mobility status ?PT Visit Diagnosis: Other  abnormalities of gait and mobility (R26.89);Unsteadiness on feet (R26.81) ?  ? ? ?Time: 1013-1030 ?PT Time Calculation (min) (ACUTE ONLY): 17 min ? ?Charges:  $Therapeutic Activity: 8-22 mins          ?          ? ?Angelica Ran, PT  ?03/21/22. 11:22 AM ? ?

## 2022-03-21 NOTE — Discharge Summary (Signed)
?Physician Discharge Summary ?  ?Patient: James Lambert MRN: [id number] DOB: 1966-07-11  ?Admit date:     03/18/2022  ?Discharge date: 03/21/2022  ?Discharge Physician: Lewie ChamberDavid Aaniya Sterba  ? ?PCP: Pcp, No  ? ?Recommendations at discharge:  ? ?Follow-up with dermatology, information provided on AVS ?Follow-up with orthopedic surgery, information provided on AVS ? ?Discharge Diagnoses: ?Principal Problem: ?  Acute CVA (cerebrovascular accident) (HCC) ?Active Problems: ?  Hidradenitis suppurativa ?  Hypertension ?  Nicotine dependence ?  Effusion of left knee joint ?  Lateral meniscus tear ?  COPD (chronic obstructive pulmonary disease) (HCC) ? ?Resolved Problems: ?  * No resolved hospital problems. * ? ?Hospital Course: ?James Lambert is a 56 year old male with PMH recent CVA with residual right sided weakness, HTN, COPD, hidradenitis suppurativa who presented with right arm paresthesias.  ?He was hospitalized 4/1 - 4/4 for an acute CVA which showed patchy multifocal acute ischemic infarcts bilaterally with concern for embolic phenomenon.  He had undergone full stroke work-up including TEE which showed a small bidirectional PFO.  He was recommended for following up with cardiology outpatient for placement of a loop recorder. ?He was also continued on aspirin and Plavix at discharge however he was unable to afford aspirin and was only on Plavix. ?He underwent repeat MRI brain on admission which showed a new small acute infarct of the left subinsular white matter. ? ?In addition, he was also having worsening pain and purulent drainage associated with his hidradenitis near his sacrum/perineum. He was started on Vanc/zosyn and also evaluated by general surgery.  ?He was then evaluated by infectious disease and de-escalated to Augmentin to complete a course.  He was recommended to follow-up with dermatology outpatient and was provided clinic information. ?He also underwent MRI left knee due to pain complaints and was found to have a  meniscal tear and joint effusion.  Arthrocentesis was negative for septic arthritis.  Case was also discussed with orthopedic surgery with no need for brace or urgent inpatient evaluation and he will follow-up outpatient with orthopedic surgery as well; contact information given. ? ?Medications were sent to med management prior to discharge for medication assistance and he was informed to complete the eligibility packet to continue receiving ongoing medications. ? ?Assessment and Plan: ?* Acute CVA (cerebrovascular accident) (HCC) ?- recent CVA on 4/1 (bilateral with concern for emboli) ?- recommended to follow up outpt with cardiology for ILR; will reach out while hospitalized in case able to place any monitoring prior to discharge ?- LE Duplex on 4/12 negative for DVT ?- no formal neuro eval needed given full stroke workup recently; appreciate note placed on 4/12 by neurology ?- continue asa and plavix for 90 days then monotherapy Plavix (spoke with med management, they will be able to provide both at discharge) ? ?Hidradenitis suppurativa ?- [name] and has an acute flareup ?- not a surgical candidate per surgery given acute CVA; for now trying to treat medically with abx ?-Evaluated by general surgery and ID, appreciate assistance ?- Antibiotics de-escalated to Augmentin to complete course ?-Patient to follow-up with dermatology per ID recommendations at discharge ? ?Effusion of left knee joint ?- noted on L knee MRI ?- has severe tricompartmental OA ?-Arthrocentesis performed on 03/20/2022 negative for infection ? ?Nicotine dependence ?Smoking cessation has been discussed with patient in detail ?-Patient now amenable for nicotine patch ? ?Hypertension ?- Okay to start lowering blood pressure at this time ?- Continue metoprolol ?- Resume amlodipine ?-  Use labetalol or hydralazine PRN as well ? ?Lateral meniscus tear ?- Patient endorsed left knee pain and underwent MRI  which showed small oblique tear at the body/posterior horn junction of the medial meniscus and lateral meniscus showing diffuse degeneration and radial tearing ?-Discussed with orthopedic surgery.  No intervention required inpatient and he can follow-up outpatient ? ?COPD (chronic obstructive pulmonary disease) (HCC) ?Not acutely exacerbated ?Continue as needed bronchodilator therapy ? ? ? ? ?  ? ? ?Consultants:  ?General surgery ?ID ? ?Procedures performed: Left knee arthrocentesis, 03/20/2022 ?Disposition: Home ?Diet recommendation:  ?Discharge Diet Orders (From admission, onward)  ? ?  Start     Ordered  ? 03/21/22 0000  Diet general       ? 03/21/22 1109  ? ?  ?  ? ?  ? ?Regular diet ?DISCHARGE MEDICATION: ?Allergies as of 03/21/2022   ?No Known Allergies ?  ? ?  ?Medication List  ?  ? ?STOP taking these medications   ? ?aspirin 81 MG EC tablet ?Replaced by: aspirin 81 MG chewable tablet ?  ?ProAir HFA 108 (90 Base) MCG/ACT inhaler ?Generic drug: albuterol ?  ? ?  ? ?TAKE these medications   ? ?amLODipine 10 MG tablet ?Commonly known as: NORVASC ?Take 1 tablet (10 mg total) by mouth once daily. ?What changed: medication strength ?  ?amoxicillin-clavulanate 875-125 MG tablet ?Commonly known as: AUGMENTIN ?Take 1 tablet by mouth every 12 (twelve) hours for 13 days. ?  ?aspirin 81 MG chewable tablet ?Chew 1 tablet (81 mg total) by mouth once daily. ?Replaces: aspirin 81 MG EC tablet ?  ?atorvastatin 80 MG tablet ?Commonly known as: LIPITOR ?Take 1 tablet (80 mg total) by mouth once daily. ?  ?clopidogrel 75 MG tablet ?Commonly known as: PLAVIX ?Take 1 tablet (75 mg total) by mouth once daily. ?  ?metoprolol succinate 25 MG 24 hr tablet ?Commonly known as: TOPROL-XL ?Take 1 tablet (25 mg total) by mouth once daily. ?  ? ?  ? ? Follow-up Information   ? ? Signa Kell, MD. Call in 3 week(s).   ?Specialty: Orthopedic Surgery ?Why: Call office to make appointment ?Contact information: ?[address] ?[place]  [place] [address] ?[phone] ? ? ?  ?  ? ? Leonette Nutting, MD. Call in 2 week(s).   ?Specialty: Dermatology ?Why: Call to make appointment ?Contact information: ?[address] ?[address] ?[place] [address] ?[phone] ? ? ?  ?  ? ?  ?  ? ?  ? ?Discharge Exam: ?Filed Weights  ? 03/18/22 1140  ?Weight: 113.4 kg  ? ?Physical Exam ?Constitutional:   ?   General: He is not in acute distress. ?   Appearance: Normal appearance.  ?HENT:  ?   Head: Normocephalic and atraumatic.  ?   Mouth/Throat:  ?   Mouth: Mucous membranes are moist.  ?Eyes:  ?   Extraocular Movements: Extraocular movements intact.  ?Cardiovascular:  ?   Rate and Rhythm: Normal rate and regular rhythm.  ?   Heart sounds: Normal heart sounds.  ?Pulmonary:  ?   Effort: Pulmonary effort is normal. No respiratory distress.  ?   Breath sounds: Normal breath sounds. No wheezing.  ?Abdominal:  ?   General: Bowel sounds are normal. There is no distension.  ?   Palpations: Abdomen is soft.  ?   Tenderness: There is no abdominal tenderness.  ?Musculoskeletal:     ?   General: Normal range of motion.  ?  Cervical back: Normal range of motion and neck supple.  ?Skin: ?   Comments: Dressing in place over gluteal region  ?Neurological:  ?   Mental Status: He is alert.  ?   Comments: 4+/5 in RUE  ?Psychiatric:     ?   Mood and Affect: Mood normal.     ?   Behavior: Behavior normal.  ? ? ? ?Condition at discharge: stable ? ?The results of significant diagnostics from this hospitalization (including imaging, microbiology, ancillary and laboratory) are listed below for reference.  ? ?Imaging Studies: ?CT ANGIO HEAD NECK W WO CM ? ?Result Date: 03/08/2022 ?CLINICAL DATA:  Initial evaluation for acute stroke. EXAM: CT ANGIOGRAPHY HEAD AND NECK TECHNIQUE: Multidetector CT imaging of the head and neck was performed using the standard protocol during bolus administration of intravenous contrast. Multiplanar CT image reconstructions and MIPs were obtained to evaluate the  vascular anatomy. Carotid stenosis measurements (when applicable) are obtained utilizing NASCET criteria, using the distal internal carotid diameter as the denominator. RADIATION DOSE REDUCTION: This ex

## 2022-03-21 NOTE — Assessment & Plan Note (Signed)
-   Patient endorsed left knee pain and underwent MRI which showed small oblique tear at the body/posterior horn junction of the medial meniscus and lateral meniscus showing diffuse degeneration and radial tearing ?-Discussed with orthopedic surgery.  No intervention required inpatient and he can follow-up outpatient ?

## 2022-03-21 NOTE — TOC Transition Note (Signed)
Transition of Care (TOC) - CM/SW Discharge Note ? ? ?Patient Details  ?Name: James Lambert ?MRN: [id number] ?Date of Birth: January 23, 1966 ? ?Transition of Care (TOC) CM/SW Contact:  ?Caryn Section, RN ?Phone Number: ?03/21/2022, 11:18 AM ? ? ?Clinical Narrative:   Patient will discharge today with [facility], no equipment recommended for patient.   ? ? ? ?Final next level of care: Home w Home Health Services ?Barriers to Discharge: Continued Medical Work up ? ? ?Patient Goals and CMS Choice ?Patient states their goals for this hospitalization and ongoing recovery are:: To return home. ?CMS Medicare.gov Compare Post Acute Care list provided to:: Patient ?Choice offered to / list presented to : Patient ? ?Discharge Placement ?  ?           ?  ?  ?  ?  ? ?Discharge Plan and Services ?  ?  ?           ?  ?  ?  ?  ?  ?HH Arranged: PT, OT ?HH Agency: [facility] ?Date HH Agency Contacted: 03/20/22 ?Time HH Agency Contacted: 1100 ?Representative spoke with at [facility] Agency: Denyse Amass ? ?Social Determinants of Health (SDOH) Interventions ?  ? ? ?Readmission Risk Interventions ?   ? View : No data to display.  ?  ?  ?  ? ? ? ? ? ?

## 2022-03-23 LAB — AEROBIC CULTURE W GRAM STAIN (SUPERFICIAL SPECIMEN)

## 2022-03-24 LAB — BODY FLUID CULTURE W GRAM STAIN: Culture: NO GROWTH

## 2022-03-25 ENCOUNTER — Other Ambulatory Visit: Payer: Self-pay

## 2022-04-25 IMAGING — CR DG LUMBAR SPINE 2-3V
3 series · 3 of 3 positions shown · non-contrast
Comparison: None.

CLINICAL DATA: Status post trauma.

EXAM:
LUMBAR SPINE - 2-3 VIEW

[l-spine ap]
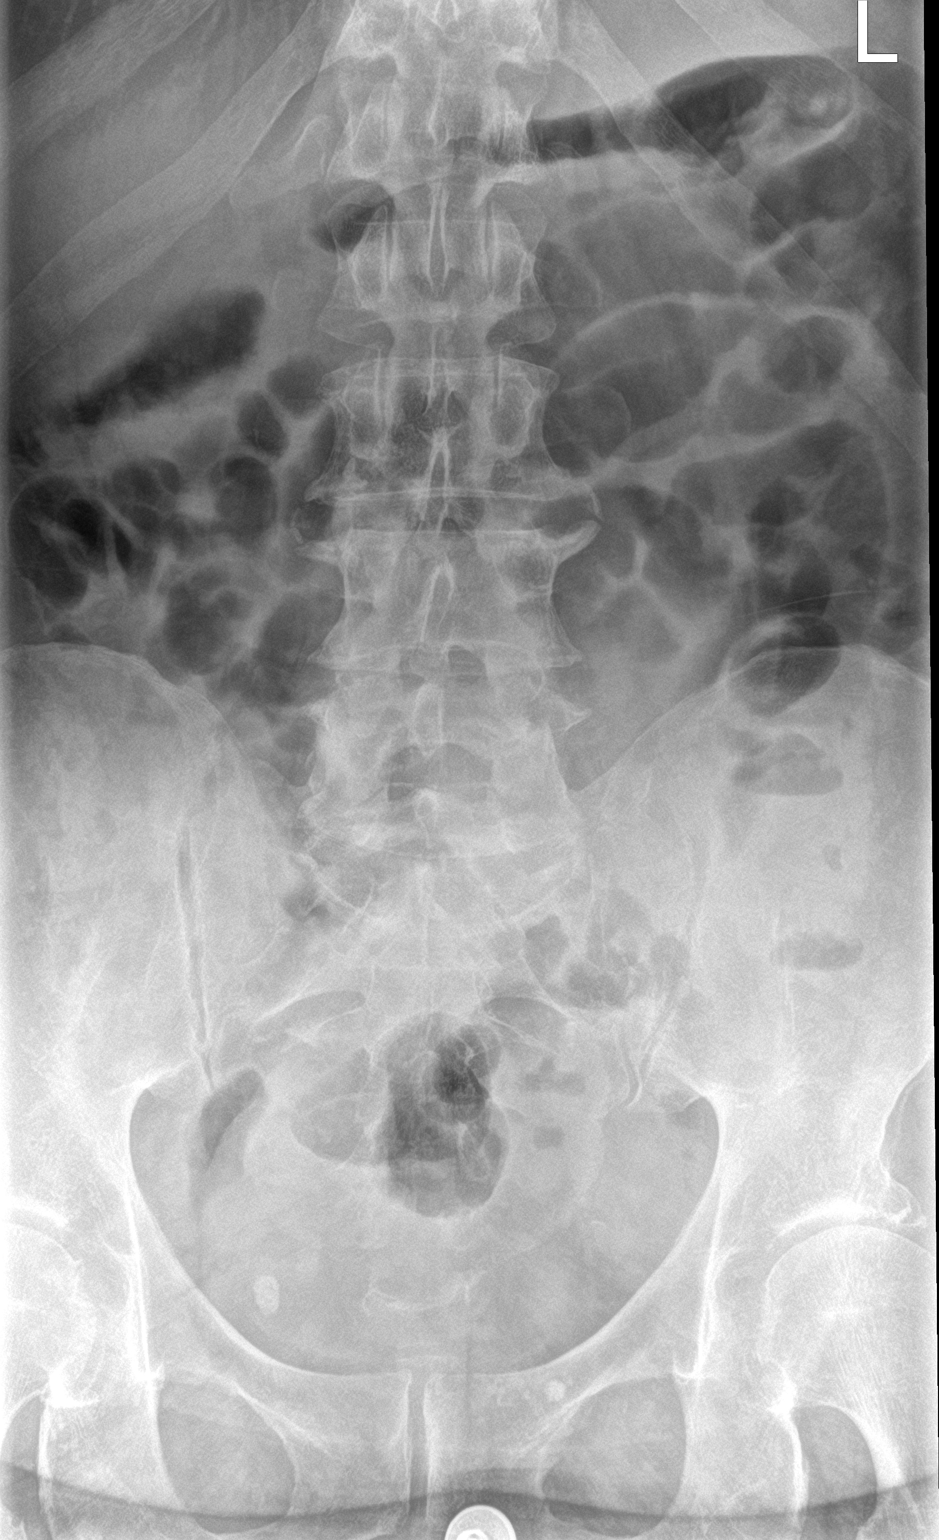

[l-spine lat]
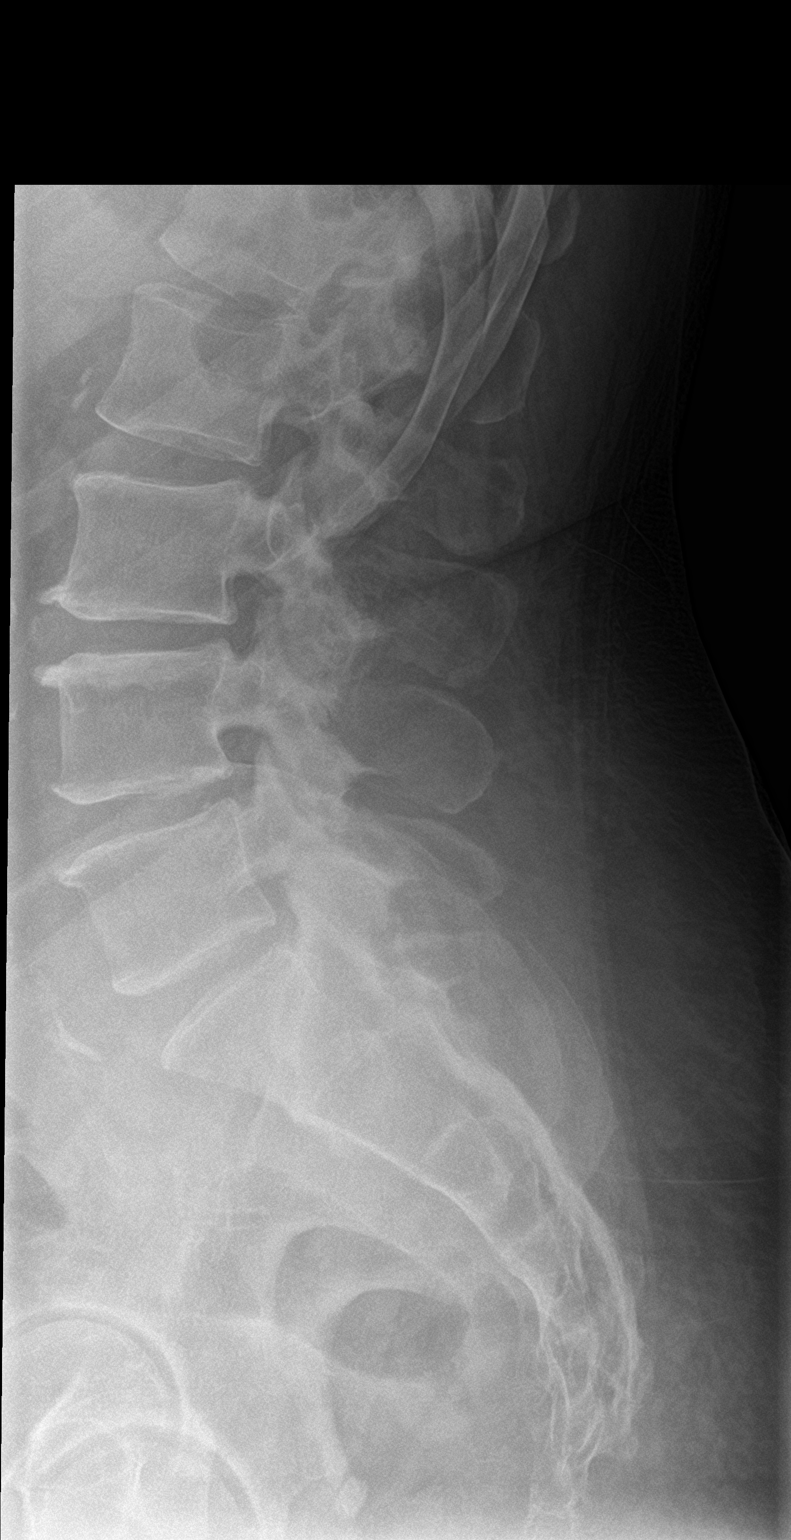

[l-spine spot]
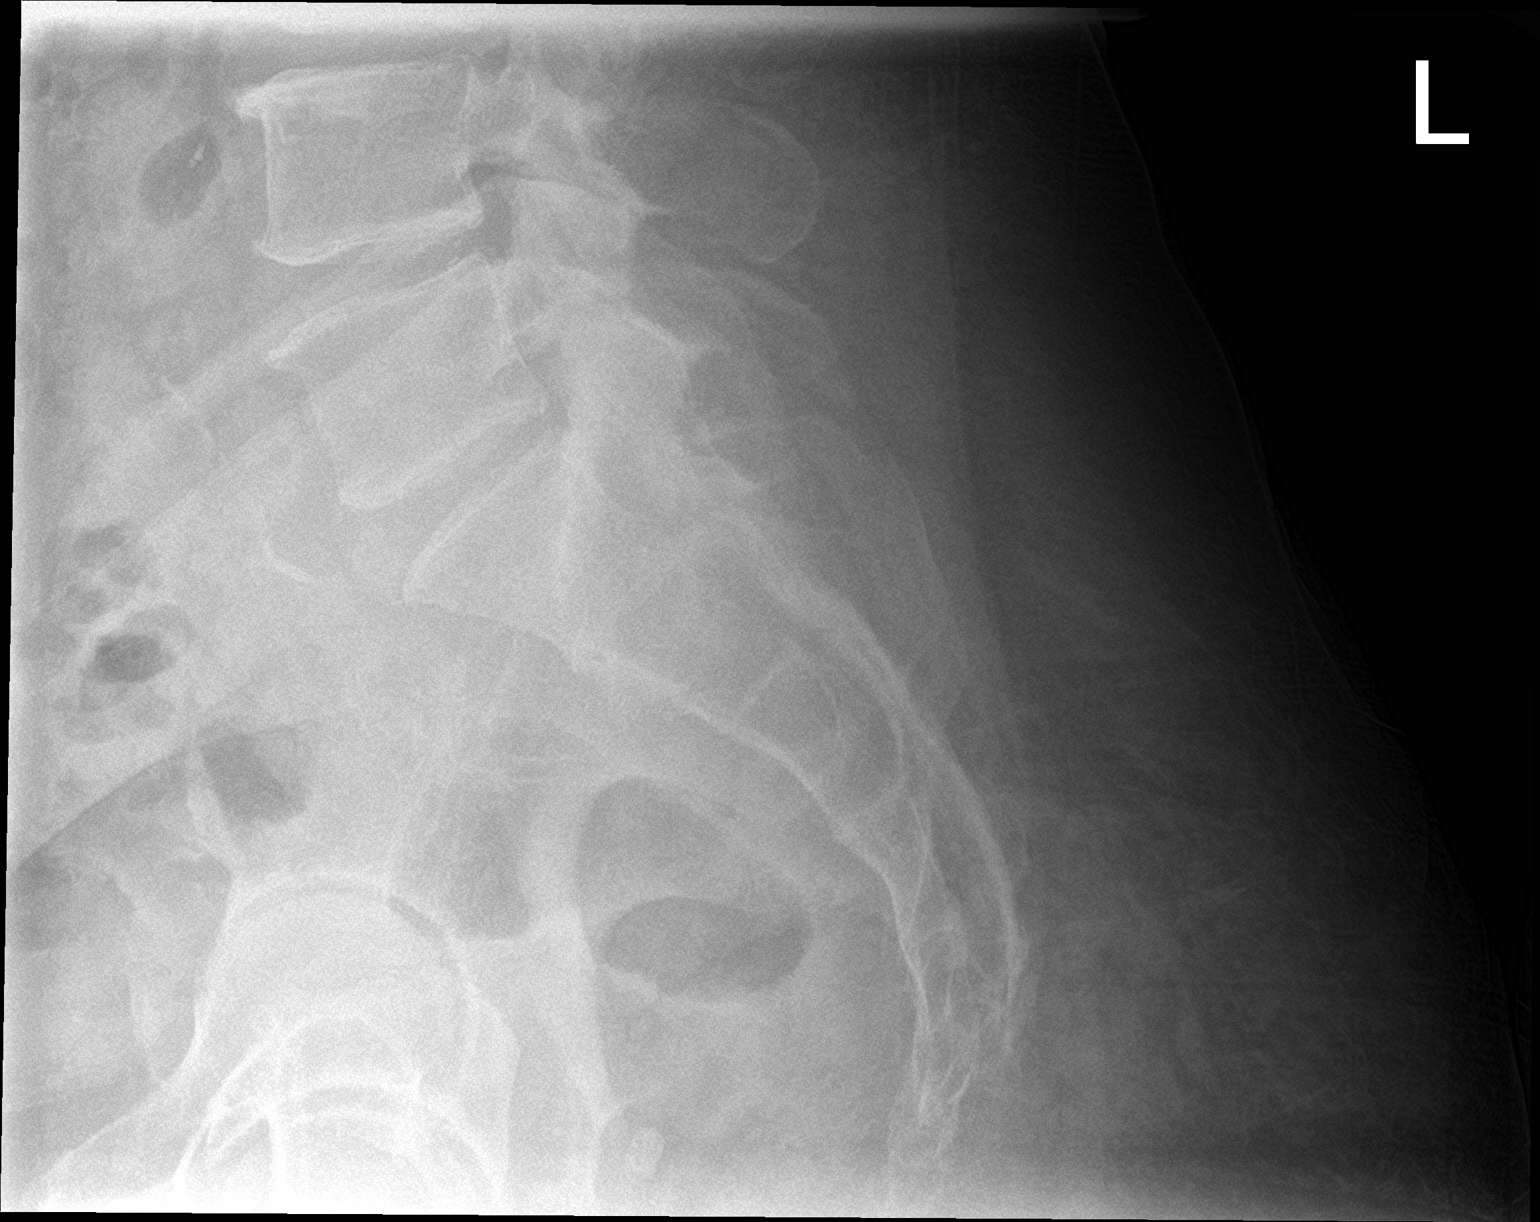

[3 of 3 positions shown; findings below may reference images not displayed]

FINDINGS: There is no evidence of lumbar spine fracture. Alignment is normal.
Moderate severity endplate sclerosis and mild to moderate severity
osteophyte formation is seen at the levels of L3-L4 and L4-L5. Very
mild multilevel intervertebral disc space narrowing is seen.
IMPRESSION: 1. No acute findings in the lumbar spine.
2. Moderate severity degenerative changes at L3-L4 and L4-L5.

## 2022-04-30 ENCOUNTER — Other Ambulatory Visit: Payer: Self-pay

## 2022-05-01 ENCOUNTER — Emergency Department: Payer: Medicaid Other

## 2022-05-01 ENCOUNTER — Emergency Department
Admission: EM | Admit: 2022-05-01 | Discharge: 2022-05-01 | Disposition: A | Discharge status: Home / Self Care | Payer: Medicaid Other | Attending: Emergency Medicine | Admitting: Emergency Medicine

## 2022-05-01 ENCOUNTER — Other Ambulatory Visit: Payer: Self-pay

## 2022-05-01 ENCOUNTER — Encounter: Payer: Self-pay | Admitting: Intensive Care

## 2022-05-01 DIAGNOSIS — I10 Essential (primary) hypertension: Secondary | ICD-10-CM | POA: Diagnosis not present

## 2022-05-01 DIAGNOSIS — L02212 Cutaneous abscess of back [any part, except buttock]: Secondary | ICD-10-CM | POA: Insufficient documentation

## 2022-05-01 DIAGNOSIS — R030 Elevated blood-pressure reading, without diagnosis of hypertension: Secondary | ICD-10-CM

## 2022-05-01 LAB — CBC WITH DIFFERENTIAL/PLATELET
Abs Immature Granulocytes: 0.05 10*3/uL (ref 0.00–0.07)
Basophils Absolute: 0 10*3/uL (ref 0.0–0.1)
Basophils Relative: 1 %
Eosinophils Absolute: 0.2 10*3/uL (ref 0.0–0.5)
Eosinophils Relative: 4 %
HCT: 36.5 % — ABNORMAL LOW (ref 39.0–52.0)
Hemoglobin: 11.4 g/dL — ABNORMAL LOW (ref 13.0–17.0)
Immature Granulocytes: 1 %
Lymphocytes Relative: 31 %
Lymphs Abs: 1.9 10*3/uL (ref 0.7–4.0)
MCH: 30 pg (ref 26.0–34.0)
MCHC: 31.2 g/dL (ref 30.0–36.0)
MCV: 96.1 fL (ref 80.0–100.0)
Monocytes Absolute: 0.6 10*3/uL (ref 0.1–1.0)
Monocytes Relative: 9 %
Neutro Abs: 3.3 10*3/uL (ref 1.7–7.7)
Neutrophils Relative %: 54 %
Platelets: 376 10*3/uL (ref 150–400)
RBC: 3.8 MIL/uL — ABNORMAL LOW (ref 4.22–5.81)
RDW: 13.7 % (ref 11.5–15.5)
WBC: 6.1 10*3/uL (ref 4.0–10.5)
nRBC: 0 % (ref 0.0–0.2)

## 2022-05-01 LAB — COMPREHENSIVE METABOLIC PANEL
ALT: 7 U/L (ref 0–44)
AST: 11 U/L — ABNORMAL LOW (ref 15–41)
Albumin: 3.2 g/dL — ABNORMAL LOW (ref 3.5–5.0)
Alkaline Phosphatase: 60 U/L (ref 38–126)
Anion gap: 8 (ref 5–15)
BUN: 10 mg/dL (ref 6–20)
CO2: 20 mmol/L — ABNORMAL LOW (ref 22–32)
Calcium: 8.8 mg/dL — ABNORMAL LOW (ref 8.9–10.3)
Chloride: 103 mmol/L (ref 98–111)
Creatinine, Ser: 0.98 mg/dL (ref 0.61–1.24)
GFR, Estimated: >60 mL/min (ref >60)
Glucose, Bld: 81 mg/dL (ref 70–99)
Potassium: 3.6 mmol/L (ref 3.5–5.1)
Sodium: 131 mmol/L — ABNORMAL LOW (ref 135–145)
Total Bilirubin: 0.4 mg/dL (ref 0.3–1.2)
Total Protein: 8 g/dL (ref 6.5–8.1)

## 2022-05-01 LAB — LACTIC ACID, PLASMA: Lactic Acid, Venous: 1.4 mmol/L (ref 0.5–1.9)

## 2022-05-01 IMAGING — US US PELVIS LIMITED
2 series · 15 of 17 positions shown · non-contrast
Comparison: None

CLINICAL DATA: Pain, abscess, 2 lumps on RIGHT buttock and 1 lump
on LEFT buttock for 1 month, started draining pus and blood today

EXAM:
LIMITED ULTRASOUND OF PELVIS
TECHNIQUE: Limited transabdominal ultrasound examination of the pelvis was
performed.

[Series 1: us pelvis limited · 4 of 5 slices shown (1 of 2)]
[im 1/5]
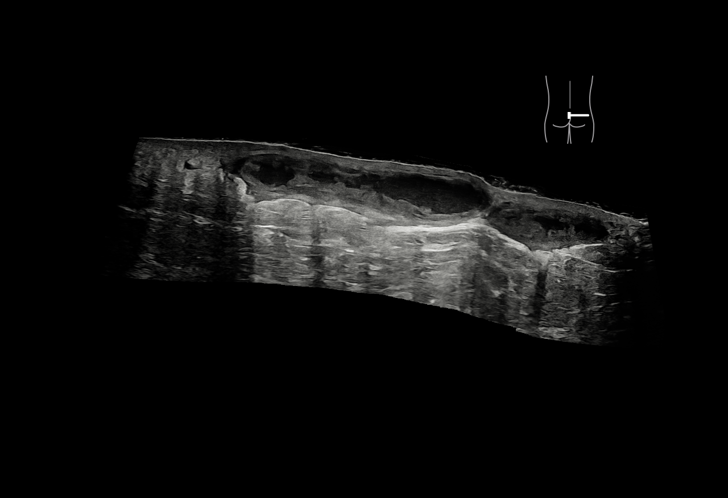
[im 2/5]
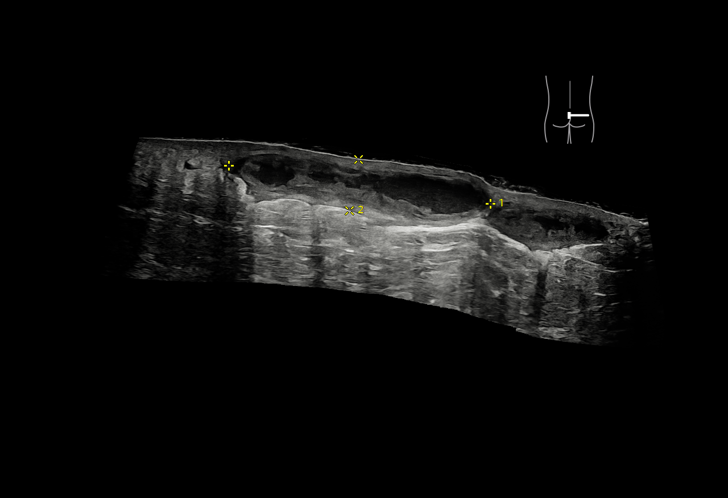
[im 3/5]
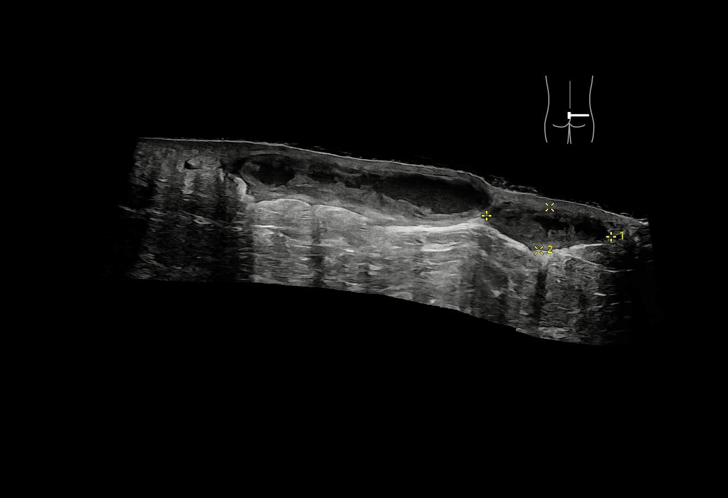
[im 4/5]
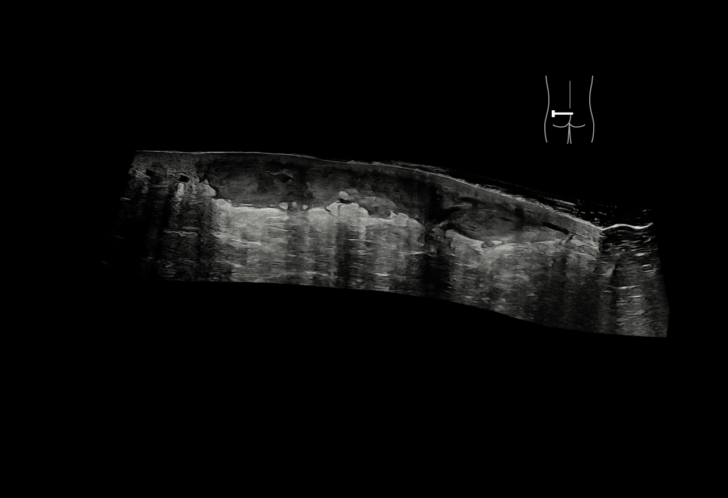

[Series 2: us pelvis limited · 11 of 12 slices shown (2 of 2)]
[im 1/12]
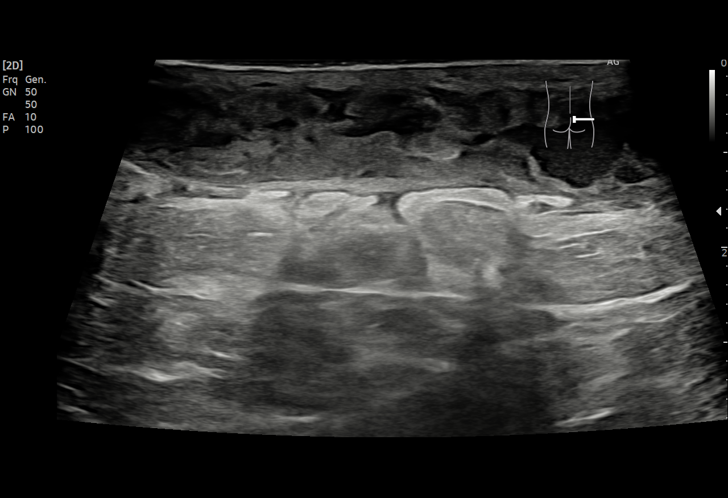
[im 2/12]
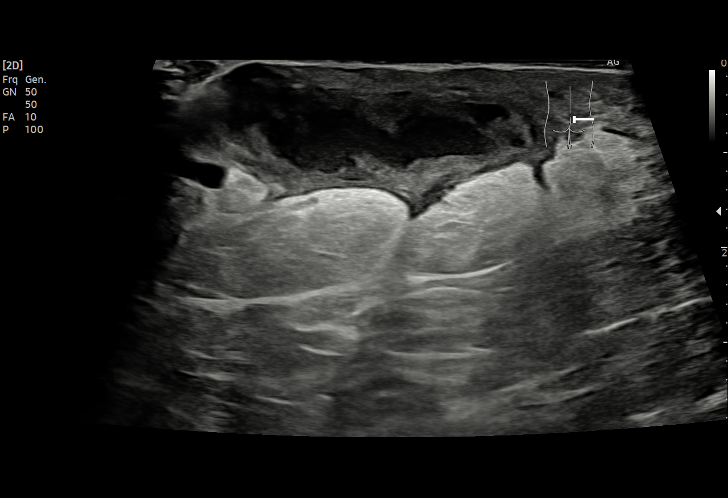
[im 3/12]
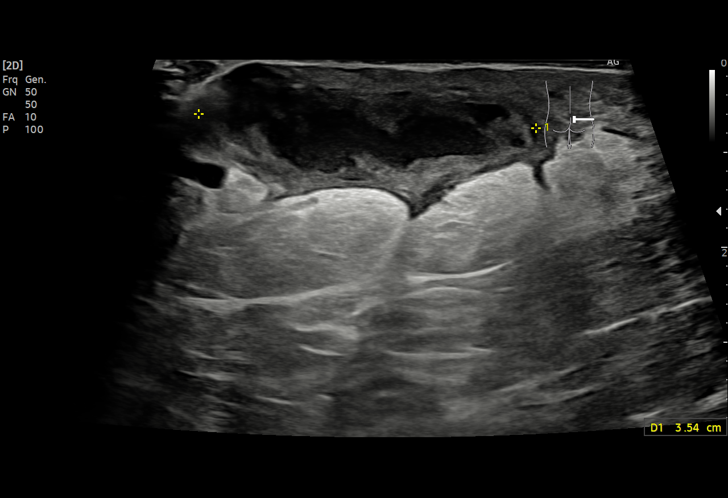
[im 4/12]
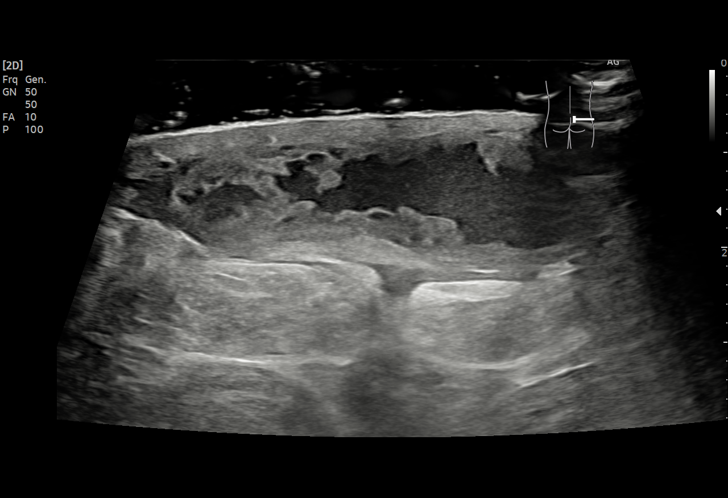
[im 5/12]
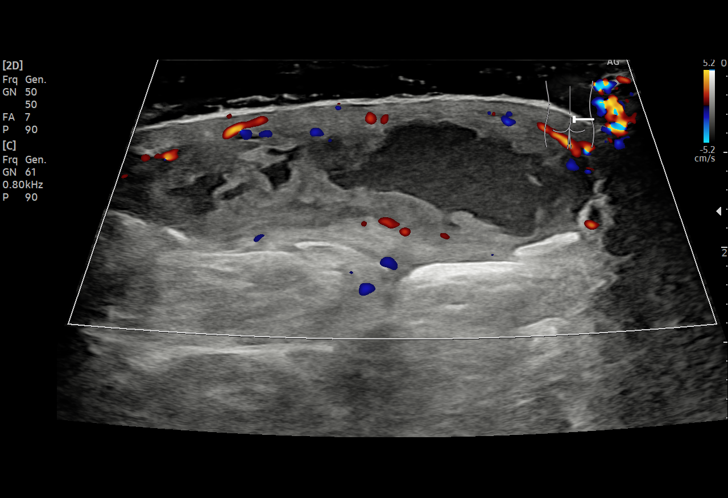
[im 6/12]
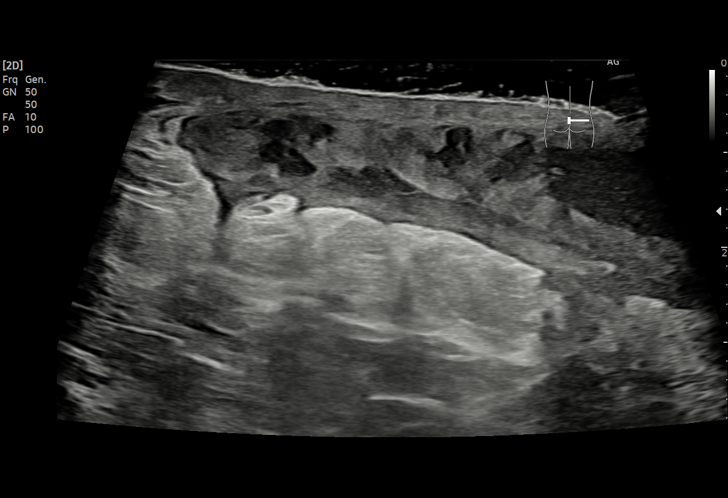
[im 7/12]
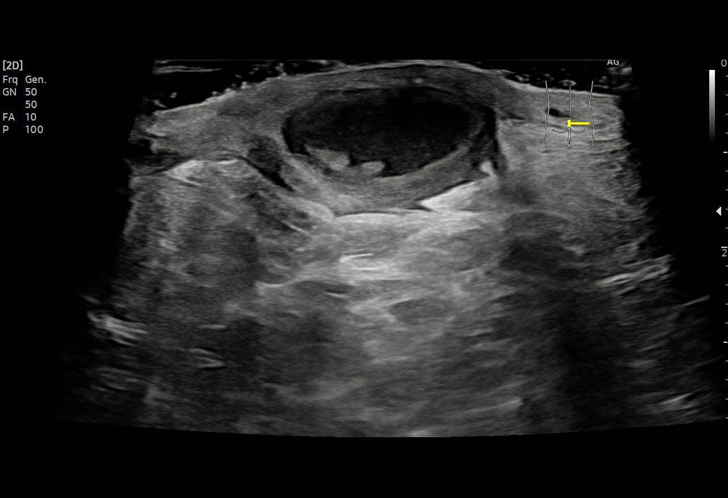
[im 9/12]
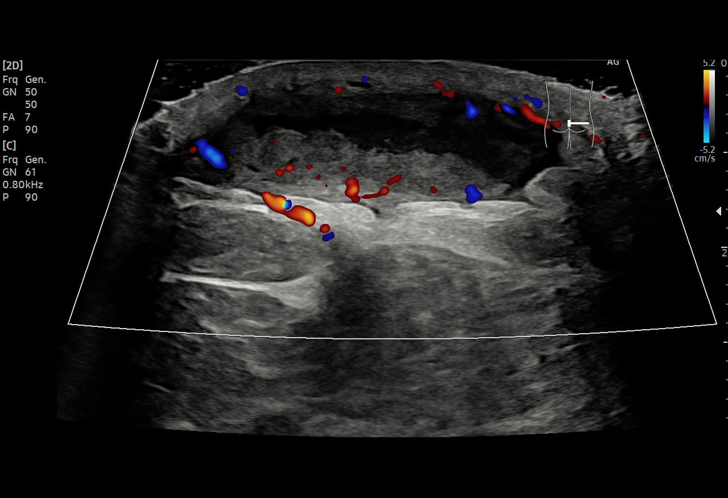
[im 10/12]
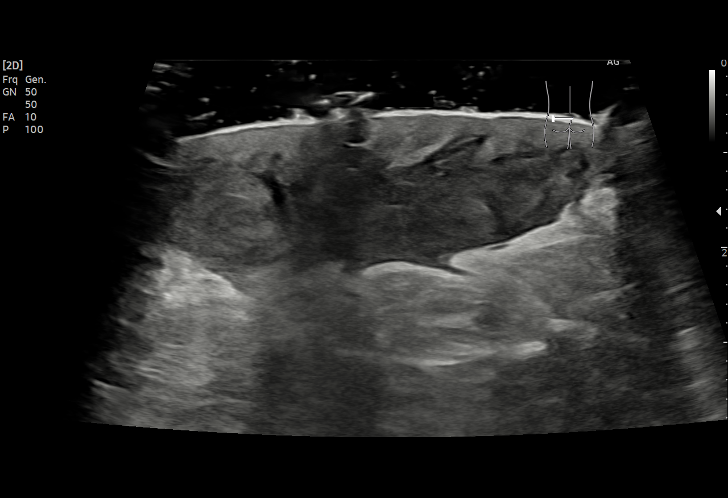
[im 11/12]
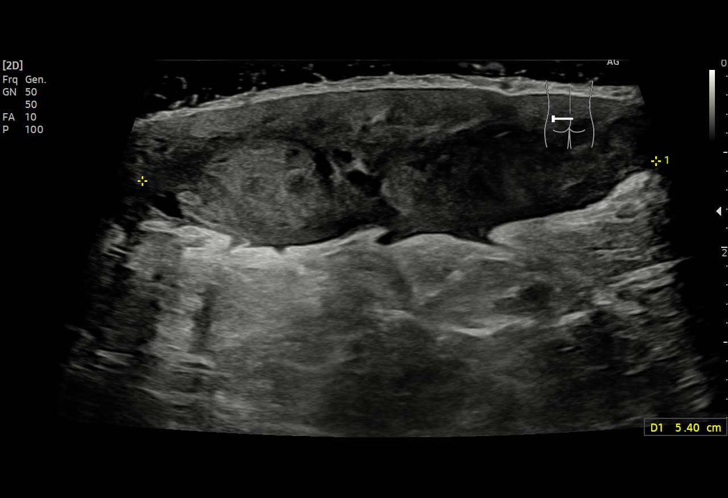
[im 12/12]
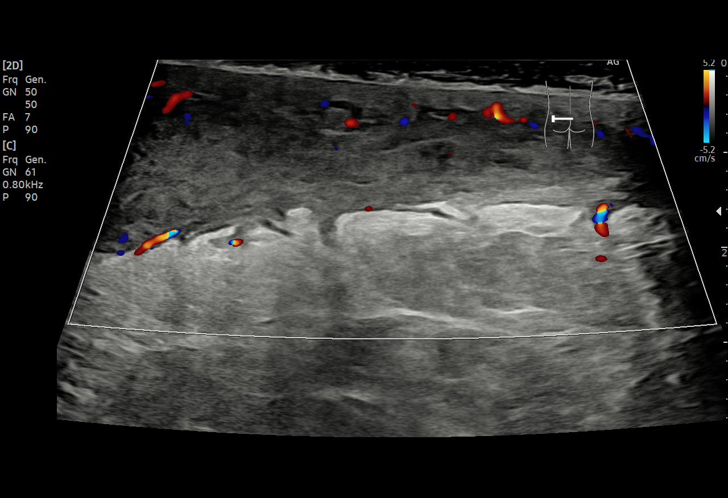

[15 of 17 positions shown; findings below may reference images not displayed]

FINDINGS: Sonography performed at the sites of clinical concern. At the LEFT
buttock, a complex subcutaneous fluid collection is identified
measuring 11.3 x 5.4 x 1.1 cm in size, containing complex
heterogeneous internal material and thick wall. This site was
actively draining pus and blood.

In the RIGHT buttock, a complex subcutaneous collection is seen
x 1.4 x 3.5 cm, also containing complex internal fluid and a thick
irregular wall with mild surrounding hypervascularity. A second
collection with similar appearance is identified measuring 3.5 x
x 2.4 cm, also demonstrating mild surrounding hypervascularity.
IMPRESSION: Three complex subcutaneous fluid collections are seen within the
buttocks, larger 1 on LEFT, 2 smaller ones on RIGHT.

The presence of surrounding hypervascularity is suggestive of an
inflammatory process.

Favor these representing subcutaneous abscesses, hematomas
considered less likely.

Clinical follow-up until resolution recommended.

## 2022-05-01 MED ORDER — HYDROCODONE-ACETAMINOPHEN 5-325 MG PO TABS: 1.0000 | ORAL_TABLET | Freq: Four times a day (QID) | ORAL | 0 refills | Status: DC | PRN

## 2022-05-01 MED ORDER — SODIUM CHLORIDE 0.9 % IV SOLN
1.0000 g | Freq: Once | INTRAVENOUS | Status: DC
  Filled 2022-05-01: qty 10

## 2022-05-01 MED ORDER — PIPERACILLIN-TAZOBACTAM 4.5 G IVPB
4.5000 g | Freq: Once | INTRAVENOUS | Status: AC
  Administered 2022-05-01: 4.5 g via INTRAVENOUS
  Filled 2022-05-01 (×2): qty 100

## 2022-05-01 MED ORDER — AMOXICILLIN-POT CLAVULANATE 875-125 MG PO TABS
1.0000 | ORAL_TABLET | Freq: Two times a day (BID) | ORAL | 0 refills | Status: DC
  Filled 2022-05-01: qty 20, 10d supply, fill #0

## 2022-05-01 MED ORDER — OXYCODONE-ACETAMINOPHEN 5-325 MG PO TABS
1.0000 | ORAL_TABLET | Freq: Once | ORAL | Status: AC
  Administered 2022-05-01: 1 via ORAL
  Filled 2022-05-01: qty 1

## 2022-05-01 NOTE — ED Notes (Signed)
Visitor at bedside.

## 2022-05-01 NOTE — ED Provider Notes (Signed)
[facility] Provider Note    Event Date/Time   First MD Initiated Contact with Patient 05/01/22 1155     (approximate)   History   Recurrent Skin Infections and Joint Swelling (left)   HPI  James Lambert is a 56 y.o. male presents to the ED with complaint of possible abscess/boil to his buttocks.  Patient states that has been draining and he has been applying a dressing to it.   He denies any fever, chills, nausea or vomiting.  Denies history of asthma, COPD, hypertension, CVA and hidradenitis.     Physical Exam   Triage Vital Signs: ED Triage Vitals  Enc Vitals Group     BP 05/01/22 1152 (!) 149/102     Pulse Rate 05/01/22 1152 86     Resp 05/01/22 1152 16     Temp 05/01/22 1152 98.8 F (37.1 C)     Temp Source 05/01/22 1152 Oral     SpO2 05/01/22 1152 98 %     Weight 05/01/22 1151 225 lb (102.1 kg)     Height 05/01/22 1151 6\' 2"  (1.88 m)     Head Circumference --      Peak Flow --      Pain Score 05/01/22 1153 6     Pain Loc --      Pain Edu? --      Excl. in [place]? --     Most recent vital signs: Vitals:   05/01/22 1620 05/01/22 1704  BP: (!) 162/98 (!) 162/94  Pulse:  90  Resp:  20  Temp:    SpO2:  94%     General: Awake, no distress.  CV:  Good peripheral perfusion.  Resp:  Normal effort.  Abd:  No distention.  Other:  Left buttocks with draining abscess and right buttocks with firm area with drainage noted.  Mild tenderness.  No surrounding cellulitis.    ED Results / Procedures / Treatments   Labs (all labs ordered are listed, but only abnormal results are displayed) Labs Reviewed  CBC WITH DIFFERENTIAL/PLATELET - Abnormal; Notable for the following components:      Result Value   RBC 3.80 (*)    Hemoglobin 11.4 (*)    HCT 36.5 (*)    All other components within normal limits  COMPREHENSIVE METABOLIC PANEL - Abnormal; Notable for the following components:   Sodium 131 (*)    CO2 20 (*)    Calcium 8.8 (*)     Albumin 3.2 (*)    AST 11 (*)    All other components within normal limits  LACTIC ACID, PLASMA      RADIOLOGY Ultrasound images were reviewed by me and radiology report shows 3 abscesses that are currently draining during exam.  Both on the right buttocks and left.    PROCEDURES:  Critical Care performed:   Procedures   MEDICATIONS ORDERED IN ED: Medications  piperacillin-tazobactam (ZOSYN) IVPB 4.5 g (0 g Intravenous Stopped 05/01/22 1644)  oxyCODONE-acetaminophen (PERCOCET/ROXICET) 5-325 MG per tablet 1 tablet (1 tablet Oral Given 05/01/22 1618)     IMPRESSION / MDM / ASSESSMENT AND PLAN / ED COURSE  I reviewed the triage vital signs and the nursing notes.   Differential diagnosis includes, but is not limited to, abscess buttocks, pilonidal cyst, cellulitis.  56 year old male presents to the ED with complaint of abscess to his buttocks that has been draining.  Patient states he was seen 1 month ago for the same and it  was getting better until recently.  He has been using gauze on these areas as they have been draining.  He denies any fever or chills.  Ultrasound does identify 3 individual abscesses all of which are currently draining.  Patient was given Zosyn IV and oxycodone while in the emergency department.  He will continue with Augmentin 875 twice daily along with pain medication at home and encouraged to use warm moist compresses or a sitz bath to help these areas continue to drain.  He is also to follow-up with Dr. Dahlia Byes who is on-call for surgery.  He also is aware that he should return to the emergency department over the holiday weekend especially if he develops any nausea, vomiting or fever and chills.   FINAL CLINICAL IMPRESSION(S) / ED DIAGNOSES   Final diagnoses:  Cutaneous abscess of back excluding buttocks  Elevated blood pressure reading     Rx / DC Orders   ED Discharge Orders          Ordered    amoxicillin-clavulanate (AUGMENTIN) 875-125 MG tablet   2 times daily        05/01/22 1600    HYDROcodone-acetaminophen (NORCO/VICODIN) 5-325 MG tablet  Every 6 hours PRN        05/01/22 1601             Note:  This document was prepared using Dragon voice recognition software and may include unintentional dictation errors.   Johnn Hai, PA-C 05/02/22 1302    Blake Divine, MD 05/06/22 [phone]

## 2022-05-01 NOTE — ED Triage Notes (Signed)
Patient c/o multiple boils on buttocks that he was seen for about a month ago and keep coming back/not getting any better. Patient also c/o swelling to left knee

## 2022-05-01 NOTE — Discharge Instructions (Addendum)
Begin taking the antibiotic twice a day for the next 10 days.  The antibiotic was sent to the pharmacy across the street and the medication for pain was sent to [facility] on [address].  Use warm moist compresses to the area and if possible do a warm water sitz bath at least once a day.  These areas should continue to drain.  If you develop any fever or worsening of these areas you should return to the emergency department at which time you may need to be admitted.  Also a list of clinics is listed on your discharge papers to call to see if you can get a primary care provider.  Your blood pressure was elevated while in the emergency department and if this continues you may need to be treated for hypertension.  Dr. Everlene Farrier is the surgeon on-call and this is who you need to make an appointment with for follow-up.

## 2022-05-01 NOTE — ED Notes (Signed)
See triage note.  Presents with possible abscess areas to buttock area  also is having pain to left knee  states he feels like the pain is worse since having fluid drained    unable to bear full wt d/t pain

## 2022-05-01 NOTE — ED Provider Notes (Incomplete)
   [facility] Provider Note    Event Date/Time   First MD Initiated Contact with Patient 05/01/22 1155     (approximate)   History   Recurrent Skin Infections and Joint Swelling (left)   HPI  James Lambert is a 56 y.o. male         Physical Exam   Triage Vital Signs: ED Triage Vitals  Enc Vitals Group     BP 05/01/22 1152 (!) 149/102     Pulse Rate 05/01/22 1152 86     Resp 05/01/22 1152 16     Temp 05/01/22 1152 98.8 F (37.1 C)     Temp Source 05/01/22 1152 Oral     SpO2 05/01/22 1152 98 %     Weight 05/01/22 1151 225 lb (102.1 kg)     Height 05/01/22 1151 6\' 2"  (1.88 m)     Head Circumference --      Peak Flow --      Pain Score 05/01/22 1153 6     Pain Loc --      Pain Edu? --      Excl. in [place]? --     Most recent vital signs: Vitals:   05/01/22 1152  BP: (!) 149/102  Pulse: 86  Resp: 16  Temp: 98.8 F (37.1 C)  SpO2: 98%     General: Awake, no distress. *** CV:  Good peripheral perfusion. *** Resp:  Normal effort. *** Abd:  No distention. *** Other:  ***   ED Results / Procedures / Treatments   Labs (all labs ordered are listed, but only abnormal results are displayed) Labs Reviewed - No data to display   EKG  ***   RADIOLOGY ***    PROCEDURES:  Critical Care performed:   Procedures   MEDICATIONS ORDERED IN ED: Medications - No data to display   IMPRESSION / MDM / [place] / ED COURSE  I reviewed the triage vital signs and the nursing notes.   Differential diagnosis includes, but is not limited to, ***    {Remember to include, when applicable, any/all of the following data: independent review of imaging independent review of labs (comment specifically on pertinent positives and negatives) review of specific prior hospitalizations, PCP/specialist notes, etc. discuss meds given and prescribed document any discussion with consultants (including hospitalists) any clinical  decision tools you used and why (PECARN, NEXUS, etc.) did you consider admitting the patient? document social determinants of health affecting patient's care (homelessness, inability to follow up in a timely fashion, etc) document any pre-existing conditions increasing risk on current visit (e.g. diabetes and HTN increasing danger of high-risk chest pain/ACS) describes what meds you gave (especially parenteral) and why any other interventions?:1}      FINAL CLINICAL IMPRESSION(S) / ED DIAGNOSES   Final diagnoses:  None     Rx / DC Orders   ED Discharge Orders     None        Note:  This document was prepared using Dragon voice recognition software and may include unintentional dictation errors.

## 2022-05-02 ENCOUNTER — Telehealth: Payer: Self-pay | Admitting: Nurse Practitioner

## 2022-05-02 ENCOUNTER — Other Ambulatory Visit: Payer: Self-pay

## 2022-05-02 NOTE — Telephone Encounter (Signed)
Patient only signed DOH Attestation.  Would need to provide current year's household income if PAP medications were needed. ? ?Lowell Mcgurk J. Chamberlain Steinborn ?Patient Advocate Specialist ?[facility]  ?

## 2022-05-10 ENCOUNTER — Other Ambulatory Visit (HOSPITAL_COMMUNITY): Payer: Self-pay | Admitting: Emergency Medicine

## 2022-05-10 ENCOUNTER — Inpatient Hospital Stay (HOSPITAL_COMMUNITY)
Admission: EM | Admit: 2022-05-10 | Discharge: 2022-05-16 | DRG: 023 | Disposition: A | Discharge status: Rehabilitation Facility | Payer: Medicaid Other | Attending: Neurology | Admitting: Neurology

## 2022-05-10 ENCOUNTER — Emergency Department (HOSPITAL_COMMUNITY): Payer: Medicaid Other | Admitting: Registered Nurse

## 2022-05-10 ENCOUNTER — Emergency Department: Payer: Medicaid Other

## 2022-05-10 ENCOUNTER — Emergency Department
Admission: EM | Admit: 2022-05-10 | Discharge: 2022-05-10 | Payer: Medicaid Other | Attending: Emergency Medicine | Admitting: Emergency Medicine

## 2022-05-10 ENCOUNTER — Emergency Department (HOSPITAL_COMMUNITY)
Admit: 2022-05-10 | Discharge: 2022-05-10 | Disposition: A | Discharge status: Home / Self Care | Payer: Medicaid Other | Attending: Emergency Medicine | Admitting: Emergency Medicine

## 2022-05-10 ENCOUNTER — Inpatient Hospital Stay: Admission: RE | Admit: 2022-05-10 | Payer: Self-pay | Source: Home / Self Care | Admitting: Interventional Radiology

## 2022-05-10 ENCOUNTER — Emergency Department (EMERGENCY_DEPARTMENT_HOSPITAL): Payer: Medicaid Other | Admitting: Registered Nurse

## 2022-05-10 ENCOUNTER — Encounter (HOSPITAL_COMMUNITY)
Admission: EM | Disposition: A | Discharge status: Rehabilitation Facility | Payer: Self-pay | Source: Home / Self Care | Attending: Neurology

## 2022-05-10 DIAGNOSIS — I10 Essential (primary) hypertension: Secondary | ICD-10-CM | POA: Diagnosis present

## 2022-05-10 DIAGNOSIS — G8191 Hemiplegia, unspecified affecting right dominant side: Secondary | ICD-10-CM | POA: Diagnosis present

## 2022-05-10 DIAGNOSIS — Y900 Blood alcohol level of less than 20 mg/100 ml: Secondary | ICD-10-CM | POA: Diagnosis present

## 2022-05-10 DIAGNOSIS — G929 Unspecified toxic encephalopathy: Secondary | ICD-10-CM | POA: Diagnosis present

## 2022-05-10 DIAGNOSIS — R29711 NIHSS score 11: Secondary | ICD-10-CM | POA: Diagnosis not present

## 2022-05-10 DIAGNOSIS — R569 Unspecified convulsions: Secondary | ICD-10-CM | POA: Diagnosis present

## 2022-05-10 DIAGNOSIS — Z7902 Long term (current) use of antithrombotics/antiplatelets: Secondary | ICD-10-CM

## 2022-05-10 DIAGNOSIS — I639 Cerebral infarction, unspecified: Secondary | ICD-10-CM | POA: Insufficient documentation

## 2022-05-10 DIAGNOSIS — R29715 NIHSS score 15: Secondary | ICD-10-CM | POA: Diagnosis not present

## 2022-05-10 DIAGNOSIS — Z9282 Status post administration of tPA (rtPA) in a different facility within the last 24 hours prior to admission to current facility: Secondary | ICD-10-CM | POA: Diagnosis not present

## 2022-05-10 DIAGNOSIS — Z720 Tobacco use: Secondary | ICD-10-CM | POA: Diagnosis not present

## 2022-05-10 DIAGNOSIS — J449 Chronic obstructive pulmonary disease, unspecified: Secondary | ICD-10-CM | POA: Diagnosis present

## 2022-05-10 DIAGNOSIS — E871 Hypo-osmolality and hyponatremia: Secondary | ICD-10-CM | POA: Diagnosis present

## 2022-05-10 DIAGNOSIS — R4701 Aphasia: Secondary | ICD-10-CM | POA: Diagnosis present

## 2022-05-10 DIAGNOSIS — F1010 Alcohol abuse, uncomplicated: Secondary | ICD-10-CM | POA: Diagnosis present

## 2022-05-10 DIAGNOSIS — R42 Dizziness and giddiness: Secondary | ICD-10-CM | POA: Diagnosis not present

## 2022-05-10 DIAGNOSIS — Z7982 Long term (current) use of aspirin: Secondary | ICD-10-CM

## 2022-05-10 DIAGNOSIS — I63032 Cerebral infarction due to thrombosis of left carotid artery: Secondary | ICD-10-CM

## 2022-05-10 DIAGNOSIS — I63232 Cerebral infarction due to unspecified occlusion or stenosis of left carotid arteries: Principal | ICD-10-CM | POA: Diagnosis present

## 2022-05-10 DIAGNOSIS — R29724 NIHSS score 24: Secondary | ICD-10-CM | POA: Diagnosis present

## 2022-05-10 DIAGNOSIS — E785 Hyperlipidemia, unspecified: Secondary | ICD-10-CM | POA: Diagnosis not present

## 2022-05-10 DIAGNOSIS — L732 Hidradenitis suppurativa: Secondary | ICD-10-CM | POA: Diagnosis present

## 2022-05-10 DIAGNOSIS — R29710 NIHSS score 10: Secondary | ICD-10-CM | POA: Diagnosis not present

## 2022-05-10 DIAGNOSIS — R112 Nausea with vomiting, unspecified: Secondary | ICD-10-CM | POA: Diagnosis not present

## 2022-05-10 DIAGNOSIS — R29709 NIHSS score 9: Secondary | ICD-10-CM | POA: Diagnosis not present

## 2022-05-10 DIAGNOSIS — G40909 Epilepsy, unspecified, not intractable, without status epilepticus: Secondary | ICD-10-CM | POA: Diagnosis not present

## 2022-05-10 DIAGNOSIS — I6389 Other cerebral infarction: Secondary | ICD-10-CM | POA: Diagnosis not present

## 2022-05-10 DIAGNOSIS — R739 Hyperglycemia, unspecified: Secondary | ICD-10-CM | POA: Diagnosis present

## 2022-05-10 DIAGNOSIS — Z79899 Other long term (current) drug therapy: Secondary | ICD-10-CM | POA: Diagnosis not present

## 2022-05-10 DIAGNOSIS — R410 Disorientation, unspecified: Secondary | ICD-10-CM | POA: Diagnosis present

## 2022-05-10 DIAGNOSIS — G4731 Primary central sleep apnea: Secondary | ICD-10-CM | POA: Diagnosis present

## 2022-05-10 DIAGNOSIS — F17210 Nicotine dependence, cigarettes, uncomplicated: Secondary | ICD-10-CM | POA: Diagnosis present

## 2022-05-10 DIAGNOSIS — D649 Anemia, unspecified: Secondary | ICD-10-CM | POA: Diagnosis not present

## 2022-05-10 DIAGNOSIS — I161 Hypertensive emergency: Secondary | ICD-10-CM | POA: Diagnosis present

## 2022-05-10 HISTORY — PX: IR US GUIDE VASC ACCESS RIGHT: IMG2390

## 2022-05-10 HISTORY — PX: IR PERCUTANEOUS ART THROMBECTOMY/INFUSION INTRACRANIAL INC DIAG ANGIO: IMG6087

## 2022-05-10 HISTORY — PX: IR CT HEAD LTD: IMG2386

## 2022-05-10 HISTORY — DX: Cerebral infarction, unspecified: I63.9

## 2022-05-10 HISTORY — PX: RADIOLOGY WITH ANESTHESIA: SHX6223

## 2022-05-10 LAB — COMPREHENSIVE METABOLIC PANEL
ALT: 7 U/L (ref 0–44)
AST: 14 U/L — ABNORMAL LOW (ref 15–41)
Albumin: 3.2 g/dL — ABNORMAL LOW (ref 3.5–5.0)
Alkaline Phosphatase: 64 U/L (ref 38–126)
Anion gap: 10 (ref 5–15)
BUN: 10 mg/dL (ref 6–20)
CO2: 20 mmol/L — ABNORMAL LOW (ref 22–32)
Calcium: 8.5 mg/dL — ABNORMAL LOW (ref 8.9–10.3)
Chloride: 104 mmol/L (ref 98–111)
Creatinine, Ser: 0.98 mg/dL (ref 0.61–1.24)
GFR, Estimated: >60 mL/min (ref >60)
Glucose, Bld: 96 mg/dL (ref 70–99)
Potassium: 4.3 mmol/L (ref 3.5–5.1)
Sodium: 134 mmol/L — ABNORMAL LOW (ref 135–145)
Total Bilirubin: 0.6 mg/dL (ref 0.3–1.2)
Total Protein: 8.5 g/dL — ABNORMAL HIGH (ref 6.5–8.1)

## 2022-05-10 LAB — POCT I-STAT 7, (LYTES, BLD GAS, ICA,H+H)
Acid-base deficit: 1 mmol/L (ref 0.0–2.0)
Bicarbonate: 23.2 mmol/L (ref 20.0–28.0)
Calcium, Ion: 1.19 mmol/L (ref 1.15–1.40)
HCT: 37 % — ABNORMAL LOW (ref 39.0–52.0)
Hemoglobin: 12.6 g/dL — ABNORMAL LOW (ref 13.0–17.0)
O2 Saturation: 89 %
Patient temperature: 98.3
Potassium: 3.8 mmol/L (ref 3.5–5.1)
Sodium: 135 mmol/L (ref 135–145)
TCO2: 24 mmol/L (ref 22–32)
pCO2 arterial: 37.7 mmHg (ref 32–48)
pH, Arterial: 7.397 (ref 7.35–7.45)
pO2, Arterial: 57 mmHg — ABNORMAL LOW (ref 83–108)

## 2022-05-10 LAB — CBC
HCT: 36.3 % — ABNORMAL LOW (ref 39.0–52.0)
Hemoglobin: 11.9 g/dL — ABNORMAL LOW (ref 13.0–17.0)
MCH: 30.5 pg (ref 26.0–34.0)
MCHC: 32.8 g/dL (ref 30.0–36.0)
MCV: 93.1 fL (ref 80.0–100.0)
Platelets: 385 10*3/uL (ref 150–400)
RBC: 3.9 MIL/uL — ABNORMAL LOW (ref 4.22–5.81)
RDW: 13.9 % (ref 11.5–15.5)
WBC: 7.1 10*3/uL (ref 4.0–10.5)
nRBC: 0 % (ref 0.0–0.2)

## 2022-05-10 LAB — PROTIME-INR
INR: 1.1 (ref 0.8–1.2)
Prothrombin Time: 13.8 seconds (ref 11.4–15.2)

## 2022-05-10 LAB — DIFFERENTIAL
Abs Immature Granulocytes: 0.07 10*3/uL (ref 0.00–0.07)
Basophils Absolute: 0 10*3/uL (ref 0.0–0.1)
Basophils Relative: 0 %
Eosinophils Absolute: 0.4 10*3/uL (ref 0.0–0.5)
Eosinophils Relative: 6 %
Immature Granulocytes: 1 %
Lymphocytes Relative: 32 %
Lymphs Abs: 2.2 10*3/uL (ref 0.7–4.0)
Monocytes Absolute: 0.7 10*3/uL (ref 0.1–1.0)
Monocytes Relative: 10 %
Neutro Abs: 3.6 10*3/uL (ref 1.7–7.7)
Neutrophils Relative %: 51 %

## 2022-05-10 LAB — ETHANOL: Alcohol, Ethyl (B): 42 mg/dL — ABNORMAL HIGH (ref <10)

## 2022-05-10 LAB — GLUCOSE, CAPILLARY: Glucose-Capillary: 156 mg/dL — ABNORMAL HIGH (ref 70–99)

## 2022-05-10 LAB — CBG MONITORING, ED: Glucose-Capillary: 98 mg/dL (ref 70–99)

## 2022-05-10 LAB — APTT: aPTT: 34 seconds (ref 24–36)

## 2022-05-10 IMAGING — XA IR PERCUTANEOUS ART THORMBECTOMY/INFUSION INTRACRANIAL INCLUDE D
11 series · 14 of 24 positions shown · non-contrast
Comparison: CT/CT angiogram of the head and neck [DATE].

INDICATION: Cerebrovascular accident (CVA) due to occlusion of left carotid
artery (HCC) [H4] ([H4]-CM).
TECHNIQUE: Informed written consent was obtained from the patient's brother
after a thorough discussion of the procedural risks, benefits and
alternatives. All questions were addressed. Maximal Sterile Barrier
Technique was utilized including caps, mask, sterile gowns, sterile
gloves, sterile drape, hand hygiene and skin antiseptic. A timeout
was performed prior to the initiation of the procedure.

[Series 1: cerebral care 2 · 2 acquisitions, 1 frame shown (1 of 5)]
[im 1/2]
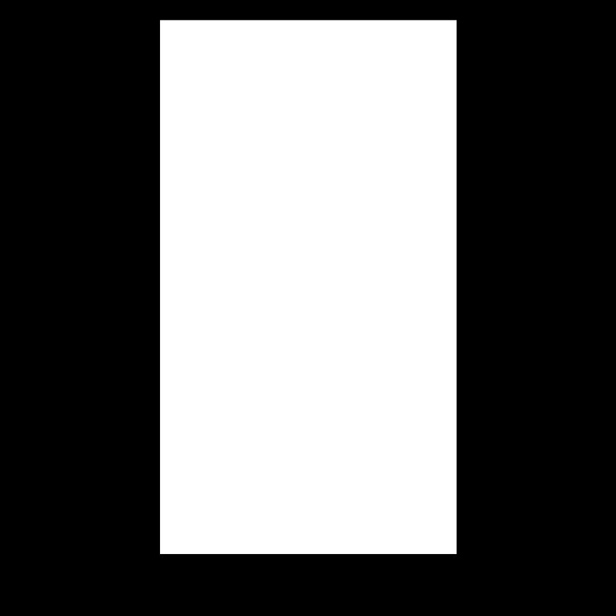

[Series 1: ir (id) (id)/(id) · 1 of 1 slices shown]
[im 1/1]
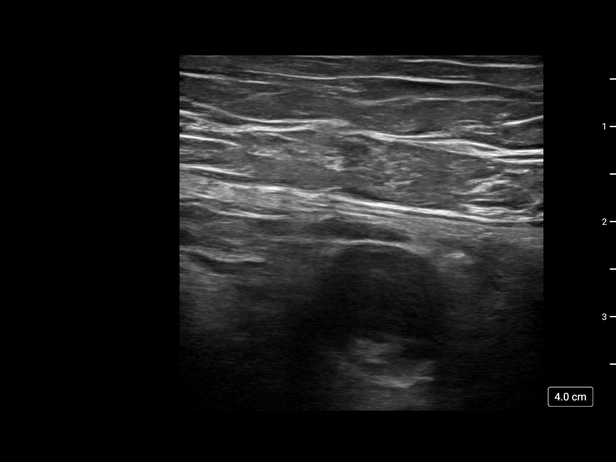

[Series 2: n roadmap · 2 acquisitions, 1 frame shown]
[im 1/2]
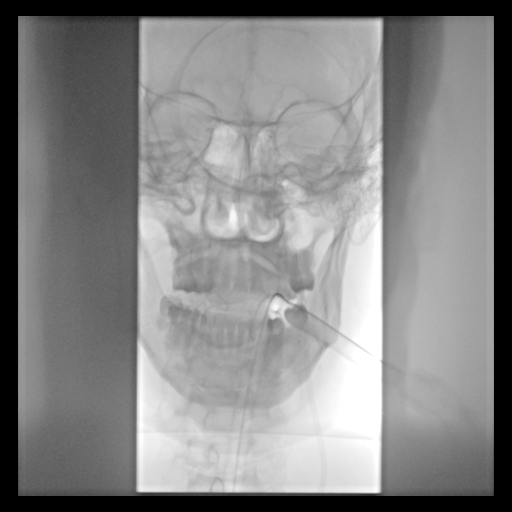

[Series 4: cerebral care 2 · 2 acquisitions, 1 frame shown (2 of 5)]
[im 1/2]
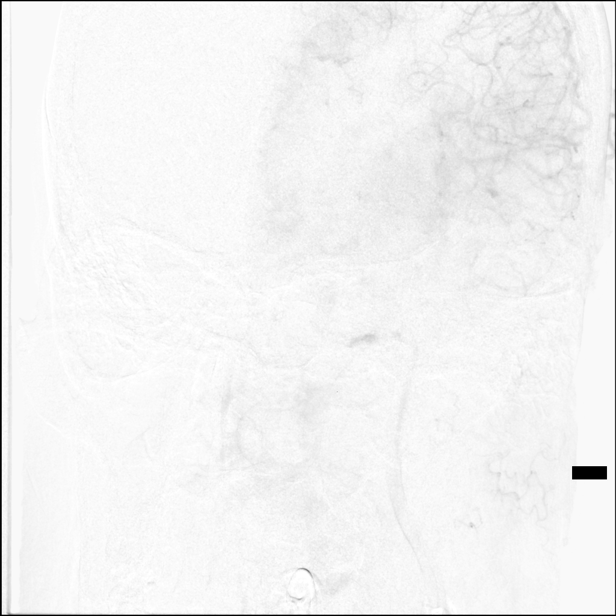

[Series 5: cerebral care 2 · 2 acquisitions, 1 frame shown (3 of 5)]
[im 1/2]
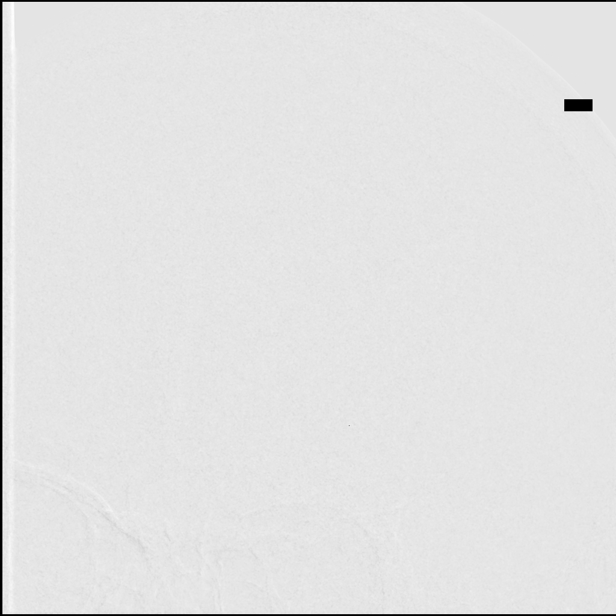

[Series 6: cerebral care 2 · 2 acquisitions, 1 frame shown (4 of 5)]
[im 1/2]
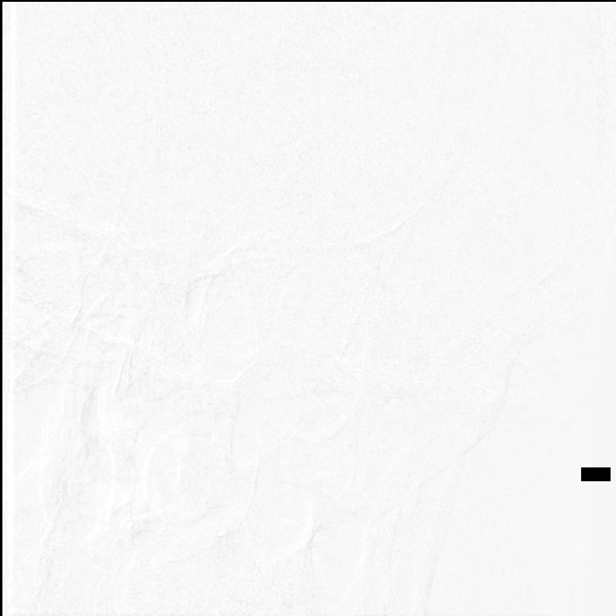

[Series 7: fl neuro n · 2 acquisitions, 2 frames shown]
[im 1/2]
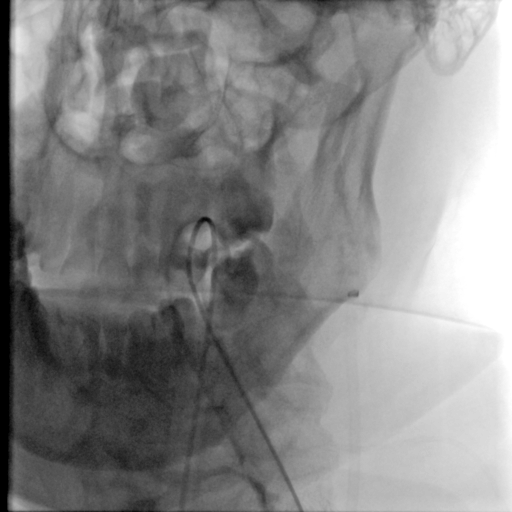
[im 1/2]
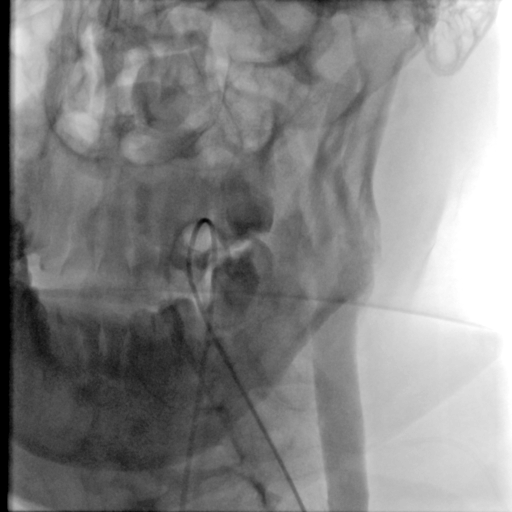

[Series 8: <mpr range> · axial · 5.0mm · 0.44mm/px · z∈[-107,-17]mm · 2 of 37 slices shown (1 of 2)]
[im 13/37]
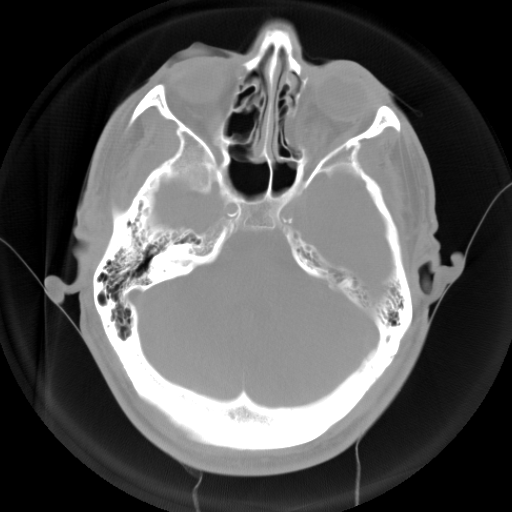
[im 31/37]
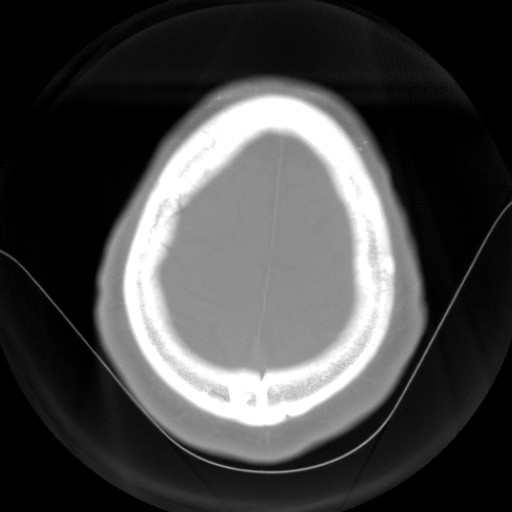

[Series 8: <mpr range> · axial · 5.0mm · 0.44mm/px · z∈[-92,-42]mm · 2 of 32 slices shown (2 of 2)]
[im 11/32]
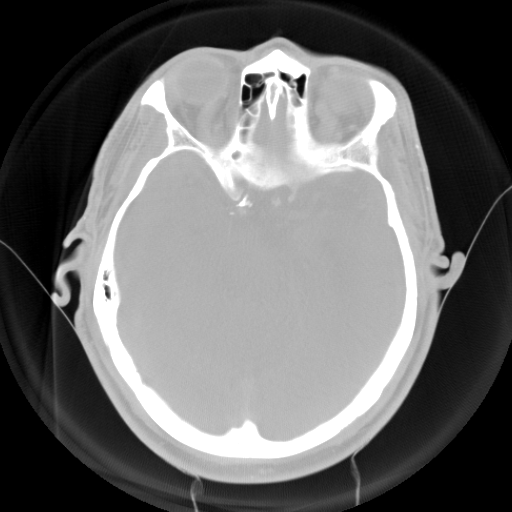
[im 21/32]
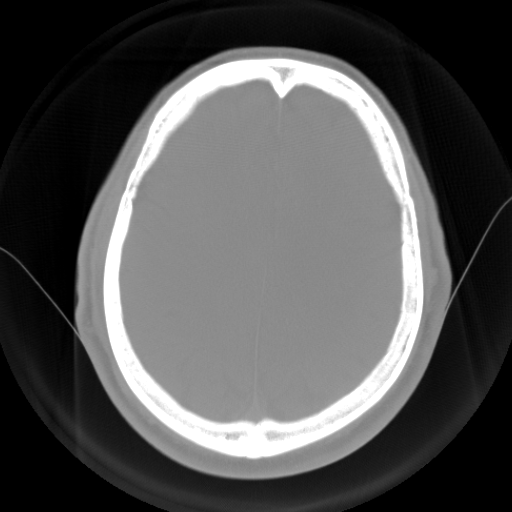

[Series 9: cerebral care 2 · 2 acquisitions, 1 frame shown (5 of 5)]
[im 1/2]
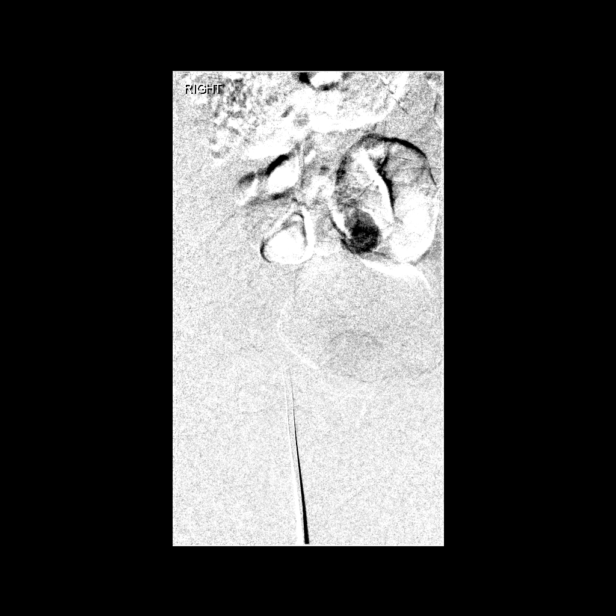

[Series 300: ir percutaneous art thrombectomy/infusio · 1 of 10 slices shown]
[im 10/10]
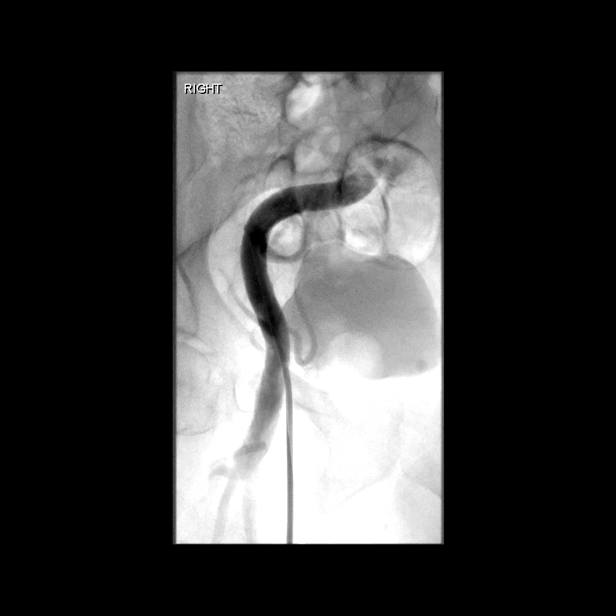

[14 of 24 positions shown; findings below may reference images not displayed]

HEDON is a 56-year-old male presenting with aphasia and
right-sided weakness. Has past medical history significant for HTN,
COPD, hidradenitis suppurativa and stroke with residual right sided
weakness. He was last known well at [DATE] p.m. on [DATE]. NIHSS
at present the patient was 15; modified HEDON 1. Head CT
showed no evidence of an acute infarct or hemorrhage. Patient
received IV tPA and HEDON. CT angiogram of the head and neck showed
occlusion of the intracranial left ICA. He was then transferred to
our service for a diagnostic cerebral angiogram and mechanical
thrombectomy.

EXAM:
ULTRASOUND-GUIDED VASCULAR [REDACTED] CEREBRAL ANGIOGRAM

MECHANICAL THROMBECTOMY
MEDICATIONS:
No antibiotics administered.

ANESTHESIA/SEDATION:
The procedure was performed under general anesthesia.

CONTRAST:  54 mL of Omnipaque 300 milligram/mL.

FLUOROSCOPY:
Radiation Exposure Index (as provided by the fluoroscopic device):
573 mGy Kerma

COMPLICATIONS:
None immediate.
The right groin was prepped and draped in the usual sterile fashion.
Using a micropuncture kit and the modified Seldinger technique,
access was gained to the right common femoral artery and an 8 French
sheath was placed. Real-time ultrasound guidance was utilized for
vascular access including the acquisition of a permanent ultrasound
image documenting patency of the accessed vessel.

Under fluoroscopy, a Zoom 88 guide catheter was navigated over a 6
HEDON 2 catheter and a 0.035" Terumo Glidewire into the
aortic arch. The catheter was placed into the left common carotid
artery and then advanced into the left internal carotid artery. The
diagnostic catheter was removed. Frontal and lateral angiograms of
the head were obtained.
FINDINGS: 1. Normal caliber of the right common femoral artery, adequate for
vascular access.
2. Occlusion of the left ICA likely at the cavernous segment given
with retrograde filling via ophthalmic artery stopping abruptly at
this segment level.
3. Atherosclerotic changes at the carotid bulbs without significant
stenosis.
4. Delayed filling of the proximal cervical left ICA related to flow
stagnation in the cervical ICA due to intracranial occlusion.

PROCEDURE:
Using biplane roadmap, an Apro 070 aspiration catheter was navigated
over an Aristotle 24 microguidewire into the upper cervical segment
of the left ICA. The aspiration catheter was then advanced to the
level of occlusion and connected to an aspiration pump. Continuous
aspiration was performed for 2 minutes. The guide catheter was
connected to a VacLok syringe. The aspiration catheter was
subsequently removed under constant aspiration. The guide catheter
was aspirated for debris.

Left internal carotid artery angiograms with frontal and lateral
views of the head showed complete recanalization of the left ICA
with brisk contrast filling of the left MCA and ACA vascular tree.
Atherosclerotic disease of the left ICA paraclinoid segment is noted
with moderate to severe stenosis.

Delayed left internal carotid artery angiograms showed stable
recanalization with brisk contrast opacification of the left
anterior circulation.

Flat panel CT of the head was obtained and post processed in a
separate workstation with concurrent attending physician
supervision. Selected images were sent to PACS. No evidence of
hemorrhagic complication.

Right common femoral artery angiogram was obtained in right anterior
oblique and lateral views. The puncture is at the level of the
common femoral artery. The artery has normal caliber, adequate for
closure device. The sheath was exchanged over the wire for a
Perclose prostyle which was utilized for access closure. Immediate
hemostasis was achieved.
IMPRESSION: Successful mechanical thrombectomy with direct contact aspiration
for treatment of an occlusion of the cavernous left ICA with
complete recanalization (TICI 3).

No evidence of thromboembolic or hemorrhagic complication.

PLAN:
Follow-up in office in 3 months after recovering from stroke.

## 2022-05-10 IMAGING — CT CT HEAD CODE STROKE
3 of 4 series · 14 of 47 positions shown, 16 images · non-contrast
Comparison: Previous MRI from [DATE].

CLINICAL DATA: Code stroke. Initial evaluation for neuro deficit,
stroke suspected.



[Series 3: ax head wo · axial · 0.35mm/px · z∈[-154,-1]mm · 8 of 41 slices shown, 10 images]
[im 5/41  brain]
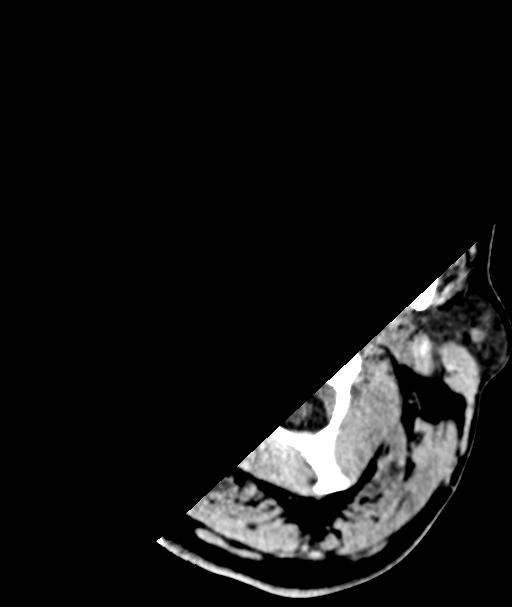
[im 5/41  bone]
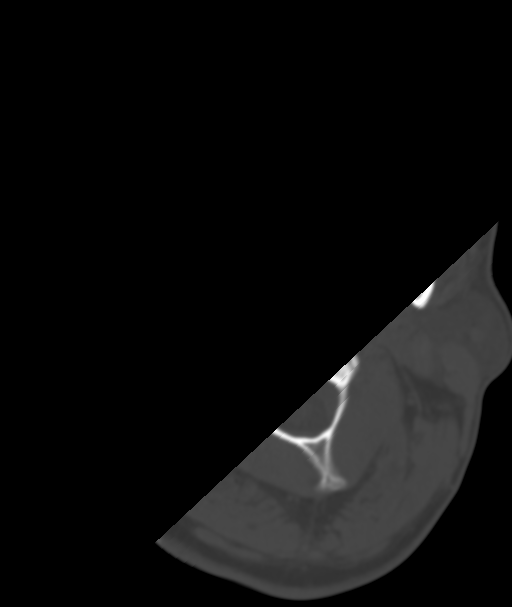
[im 9/41  brain]
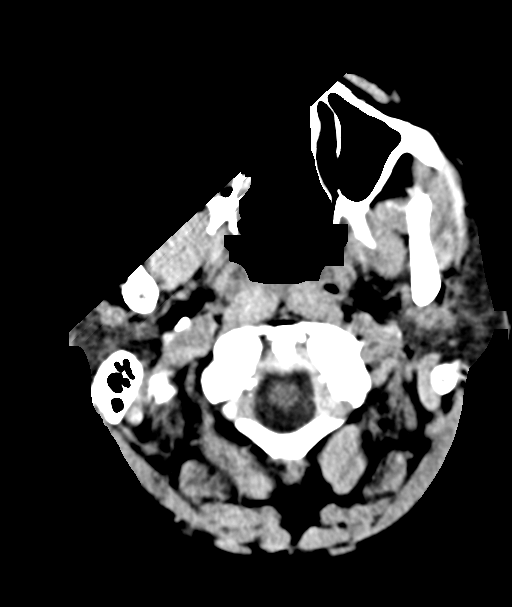
[im 14/41  brain]
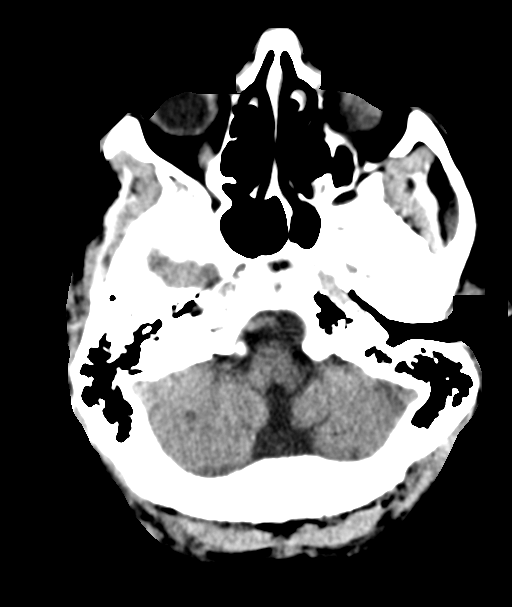
[im 18/41  brain]
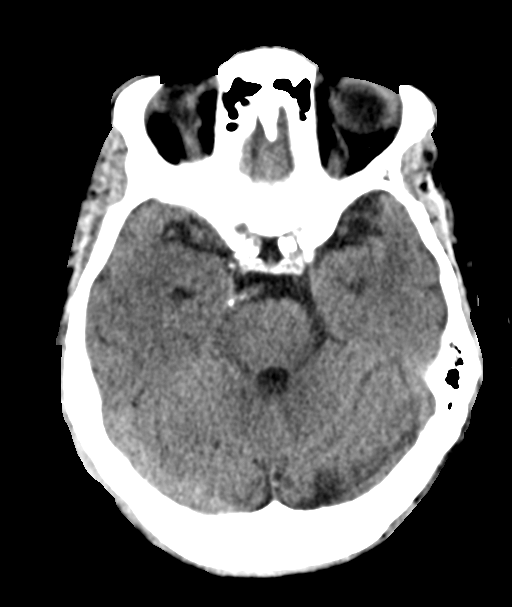
[im 23/41  brain]
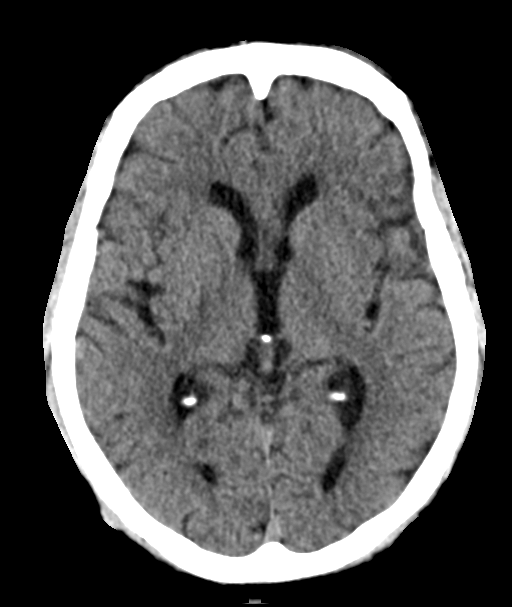
[im 23/41  bone]
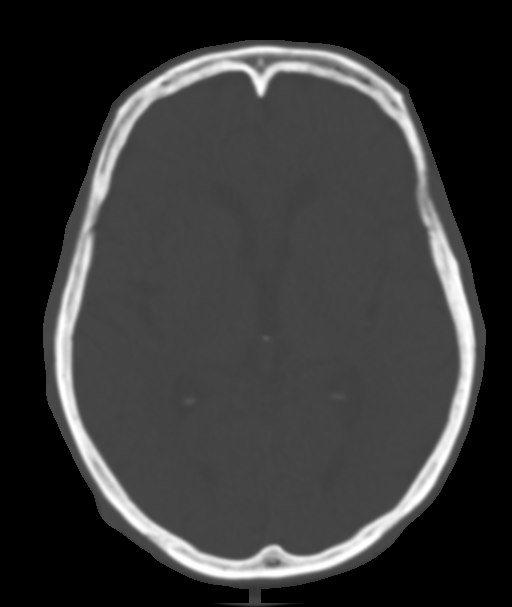
[im 27/41  brain]
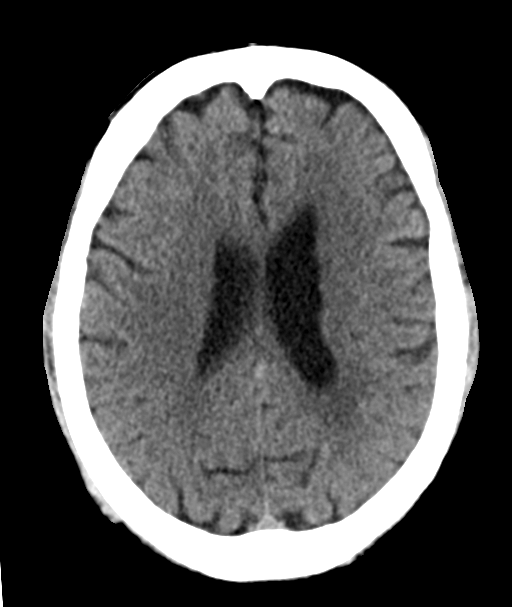
[im 32/41  brain]
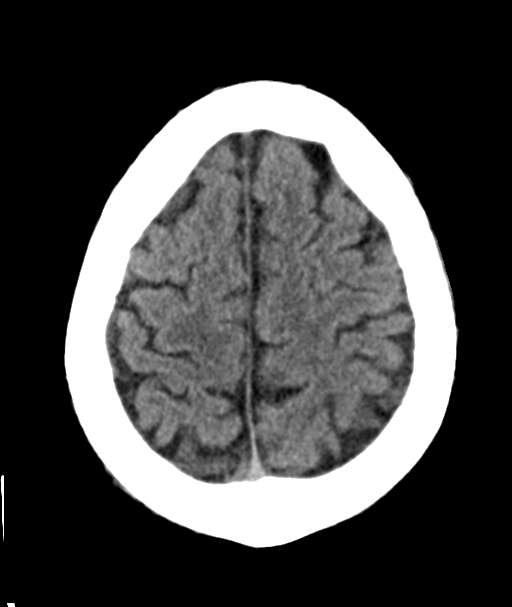
[im 36/41  brain]
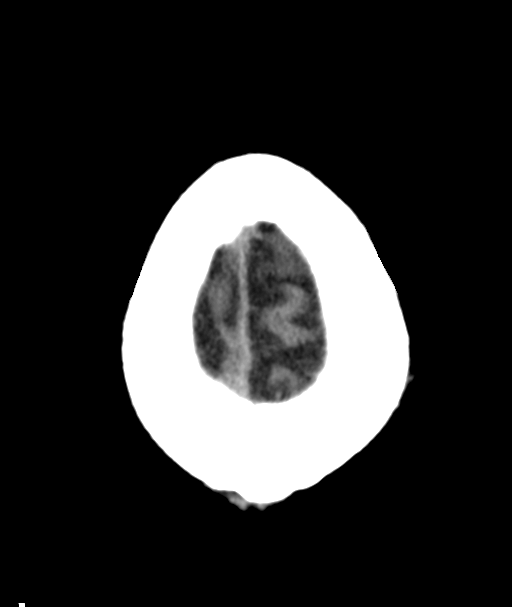

[Series 5: coronal soft tissue · coronal · 0.39mm/px · 3 of 71 slices shown]
[im 24/71  brain]
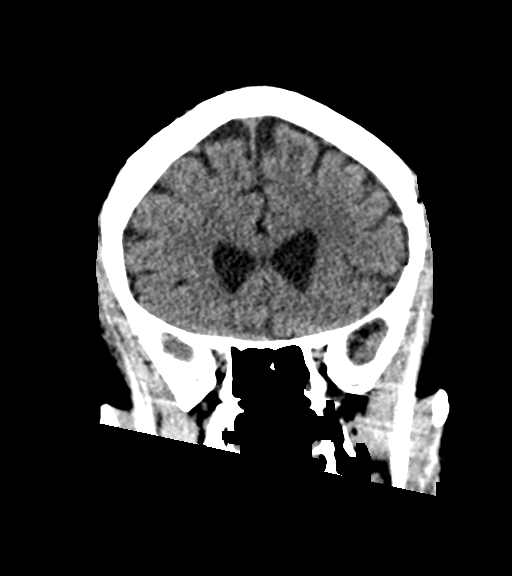
[im 32/71  brain]
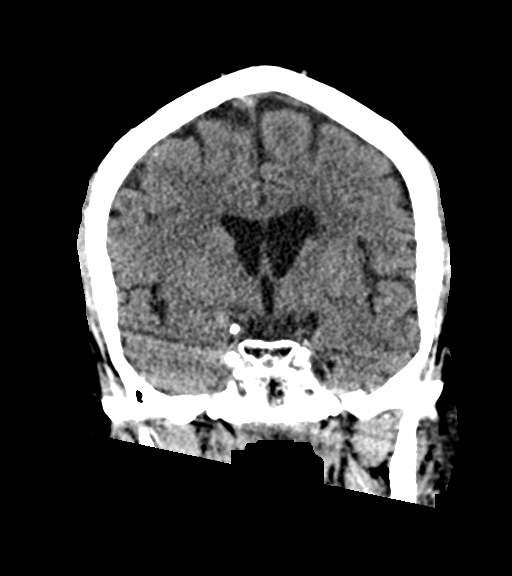
[im 39/71  brain]
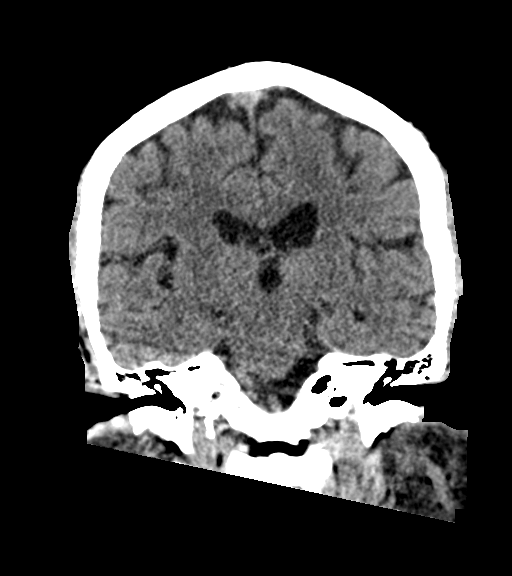

[Series 6: sagittal soft tissue · sagittal · 0.40mm/px · 3 of 58 slices shown]
[im 23/58  brain]
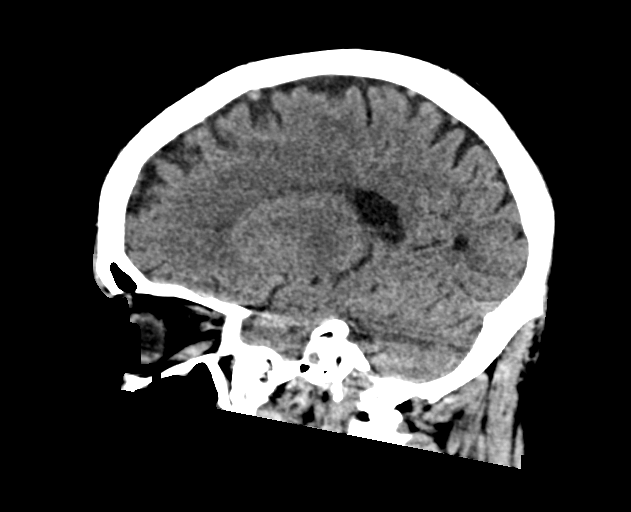
[im 29/58  brain]
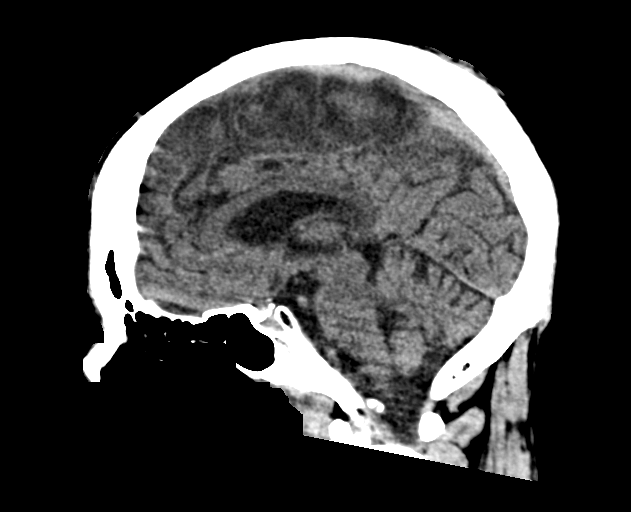
[im 34/58  brain]
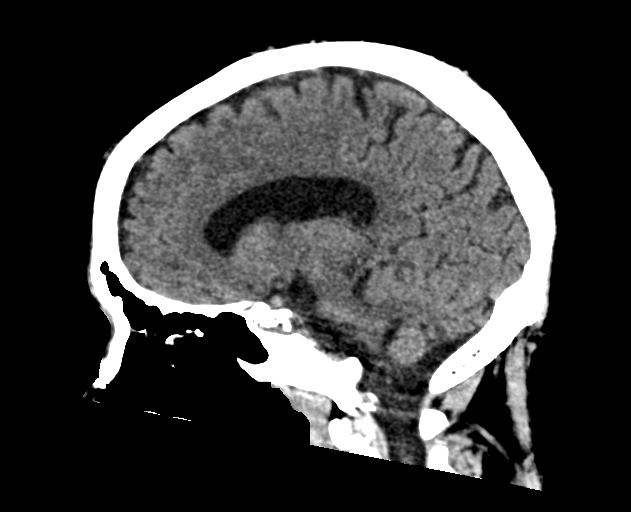

[14 of 47 positions shown; findings below may reference images not displayed]

FINDINGS: Brain: Age-related cerebral atrophy. Small remote cortical infarct
at the high posterior left parietal lobe. Additional remote left
cerebellar infarct. Small remote lacunar infarct at the left basal
ganglia.

No acute intracranial hemorrhage. No visible acute large vessel
territory infarct. No mass lesion or midline shift. No hydrocephalus
or extra-axial fluid collection.

Vascular: No hyperdense vessel. Calcified atherosclerosis present at
skull base.

Skull: Scalp soft tissues within normal limits.  Calvarium intact.

Sinuses/Orbits: Left gaze noted. Globes orbital soft tissues
demonstrate no other acute finding. Remote posttraumatic defect at
the left lamina papyracea noted. Chronic mucoperiosteal thickening
noted about the ethmoidal air cells and visualized right maxillary
sinus. Mastoid air cells are clear.

Other: None.

ASPECTS (Alberta Stroke Program Early CT Score)

- Ganglionic level infarction (caudate, lentiform nuclei, internal
capsule, insula, M1-M3 cortex): 7

- Supraganglionic infarction (M4-M6 cortex): 3

Total score (0-10 with 10 being normal): 10
IMPRESSION: 1. No acute intracranial abnormality
2. Ten.  ASPECTS is
3. Age-related cerebral atrophy with chronic left parietal, left
basal ganglia, and left cerebellar infarcts.

These results were communicated to Dr. KNO at [DATE] on
[DATE] by text page via the [employer] messaging system.

## 2022-05-10 SURGERY — IR WITH ANESTHESIA
Anesthesia: General

## 2022-05-10 MED ORDER — DEXAMETHASONE SODIUM PHOSPHATE 10 MG/ML IJ SOLN
INTRAMUSCULAR | Status: DC | PRN
  Administered 2022-05-10: 5 mg via INTRAVENOUS

## 2022-05-10 MED ORDER — PANTOPRAZOLE SODIUM 40 MG IV SOLR
40.0000 mg | Freq: Every day | INTRAVENOUS | Status: DC
  Administered 2022-05-11 – 2022-05-12 (×2): 40 mg via INTRAVENOUS
  Filled 2022-05-10: qty 10

## 2022-05-10 MED ORDER — ACETAMINOPHEN 160 MG/5ML PO SOLN: 650.0000 mg | ORAL | Status: DC | PRN

## 2022-05-10 MED ORDER — FENTANYL CITRATE (PF) 250 MCG/5ML IJ SOLN
INTRAMUSCULAR | Status: DC | PRN
  Administered 2022-05-10: 100 ug via INTRAVENOUS

## 2022-05-10 MED ORDER — ROCURONIUM BROMIDE 10 MG/ML (PF) SYRINGE
PREFILLED_SYRINGE | INTRAVENOUS | Status: DC | PRN
  Administered 2022-05-10: 50 mg via INTRAVENOUS

## 2022-05-10 MED ORDER — CLEVIDIPINE BUTYRATE 0.5 MG/ML IV EMUL
INTRAVENOUS | Status: AC
  Administered 2022-05-10: 21 mg/h via INTRAVENOUS
  Filled 2022-05-10: qty 50

## 2022-05-10 MED ORDER — LACTATED RINGERS IV SOLN: INTRAVENOUS | Status: DC | PRN

## 2022-05-10 MED ORDER — ONDANSETRON HCL 4 MG/2ML IJ SOLN: 4.0000 mg | Freq: Four times a day (QID) | INTRAMUSCULAR | Status: DC | PRN

## 2022-05-10 MED ORDER — ONDANSETRON HCL 4 MG/2ML IJ SOLN
INTRAMUSCULAR | Status: DC | PRN
  Administered 2022-05-10: 4 mg via INTRAVENOUS

## 2022-05-10 MED ORDER — ACETAMINOPHEN 650 MG RE SUPP: 650.0000 mg | RECTAL | Status: DC | PRN

## 2022-05-10 MED ORDER — CLEVIDIPINE BUTYRATE 0.5 MG/ML IV EMUL
INTRAVENOUS | Status: DC | PRN
  Administered 2022-05-10: 6 mg/h via INTRAVENOUS

## 2022-05-10 MED ORDER — ACETAMINOPHEN 160 MG/5ML PO SOLN
650.0000 mg | ORAL | Status: DC | PRN
Start: 2022-05-10 — End: 2022-05-10

## 2022-05-10 MED ORDER — TENECTEPLASE FOR STROKE
25.0000 mg | PACK | Freq: Once | INTRAVENOUS | Status: AC
Start: 2022-05-10 — End: 2022-05-10
  Administered 2022-05-10: 25 mg via INTRAVENOUS
  Filled 2022-05-10: qty 10

## 2022-05-10 MED ORDER — CLEVIDIPINE BUTYRATE 0.5 MG/ML IV EMUL
0.0000 mg/h | INTRAVENOUS | Status: DC
  Administered 2022-05-10: 2 mg/h via INTRAVENOUS
  Filled 2022-05-10: qty 50

## 2022-05-10 MED ORDER — SUGAMMADEX SODIUM 200 MG/2ML IV SOLN
INTRAVENOUS | Status: DC | PRN
  Administered 2022-05-10 (×3): 100 mg via INTRAVENOUS

## 2022-05-10 MED ORDER — CLEVIDIPINE BUTYRATE 0.5 MG/ML IV EMUL
0.0000 mg/h | INTRAVENOUS | Status: DC
  Administered 2022-05-11: 27 mg/h via INTRAVENOUS
  Administered 2022-05-11: 32 mg/h via INTRAVENOUS
  Administered 2022-05-11: 24 mg/h via INTRAVENOUS
  Administered 2022-05-11 (×5): 32 mg/h via INTRAVENOUS
  Administered 2022-05-11: 21 mg/h via INTRAVENOUS
  Administered 2022-05-12: 11 mg/h via INTRAVENOUS
  Administered 2022-05-12: 17 mg/h via INTRAVENOUS
  Administered 2022-05-12: 10 mg/h via INTRAVENOUS
  Filled 2022-05-10 (×3): qty 100
  Filled 2022-05-10: qty 200
  Filled 2022-05-10: qty 100
  Filled 2022-05-10: qty 200
  Filled 2022-05-10 (×5): qty 100
  Filled 2022-05-10: qty 200
  Filled 2022-05-10: qty 100

## 2022-05-10 MED ORDER — SODIUM CHLORIDE 0.9% FLUSH
3.0000 mL | Freq: Once | INTRAVENOUS | Status: AC
  Administered 2022-05-10: 3 mL via INTRAVENOUS

## 2022-05-10 MED ORDER — PROPOFOL 10 MG/ML IV BOLUS
INTRAVENOUS | Status: DC | PRN
  Administered 2022-05-10: 150 mg via INTRAVENOUS

## 2022-05-10 MED ORDER — IOHEXOL 300 MG/ML  SOLN
100.0000 mL | Freq: Once | INTRAMUSCULAR | Status: AC | PRN
  Administered 2022-05-10: 54 mL via INTRA_ARTERIAL

## 2022-05-10 MED ORDER — SODIUM CHLORIDE 0.9 % IV SOLN: INTRAVENOUS | Status: DC

## 2022-05-10 MED ORDER — ACETAMINOPHEN 325 MG PO TABS: 650.0000 mg | ORAL_TABLET | ORAL | Status: DC | PRN

## 2022-05-10 MED ORDER — PHENYLEPHRINE 80 MCG/ML (10ML) SYRINGE FOR IV PUSH (FOR BLOOD PRESSURE SUPPORT)
PREFILLED_SYRINGE | INTRAVENOUS | Status: DC | PRN
  Administered 2022-05-10: 80 ug via INTRAVENOUS

## 2022-05-10 MED ORDER — LIDOCAINE 2% (20 MG/ML) 5 ML SYRINGE
INTRAMUSCULAR | Status: DC | PRN
  Administered 2022-05-10: 60 mg via INTRAVENOUS

## 2022-05-10 MED ORDER — SUCCINYLCHOLINE CHLORIDE 200 MG/10ML IV SOSY
PREFILLED_SYRINGE | INTRAVENOUS | Status: DC | PRN
  Administered 2022-05-10: 120 mg via INTRAVENOUS

## 2022-05-10 MED ORDER — LORAZEPAM 2 MG/ML IJ SOLN
2.0000 mg | Freq: Four times a day (QID) | INTRAMUSCULAR | Status: DC | PRN
  Administered 2022-05-11: 2 mg via INTRAVENOUS
  Filled 2022-05-10: qty 1

## 2022-05-10 MED ORDER — IOHEXOL 350 MG/ML SOLN
100.0000 mL | Freq: Once | INTRAVENOUS | Status: AC | PRN
  Administered 2022-05-10: 100 mL via INTRAVENOUS

## 2022-05-10 MED ORDER — STROKE: EARLY STAGES OF RECOVERY BOOK
Freq: Once | Status: AC
  Filled 2022-05-10: qty 1

## 2022-05-10 MED ORDER — ACETAMINOPHEN 325 MG PO TABS
650.0000 mg | ORAL_TABLET | ORAL | Status: DC | PRN
  Administered 2022-05-16: 650 mg via ORAL
  Filled 2022-05-10: qty 2

## 2022-05-10 MED ORDER — SENNOSIDES-DOCUSATE SODIUM 8.6-50 MG PO TABS
1.0000 | ORAL_TABLET | Freq: Two times a day (BID) | ORAL | Status: DC
  Administered 2022-05-11 – 2022-05-16 (×10): 1 via ORAL
  Filled 2022-05-10 (×10): qty 1

## 2022-05-10 MED ORDER — LABETALOL HCL 5 MG/ML IV SOLN
INTRAVENOUS | Status: AC
  Filled 2022-05-10: qty 4

## 2022-05-10 NOTE — ED Notes (Signed)
Pt arrived to CT 1. This RN at bedside

## 2022-05-10 NOTE — ED Notes (Signed)
Ems left with pt.  

## 2022-05-10 NOTE — Anesthesia Preprocedure Evaluation (Addendum)
Anesthesia Evaluation  Patient identified by MRN, date of birth, ID band Patient confused    Reviewed: Allergy & Precautions, H&P , NPO status , Patient's Chart, lab work & pertinent test results, reviewed documented beta blocker date and time   Airway Mallampati: Unable to assess  TM Distance: >3 FB Neck ROM: Full    Dental no notable dental hx. (+) Teeth Intact, Dental Advisory Given   Pulmonary asthma , COPD, Current Smoker and Patient abstained from smoking.,    Pulmonary exam normal breath sounds clear to auscultation       Cardiovascular hypertension, Pt. on medications and Pt. on home beta blockers  Rhythm:Regular Rate:Normal     Neuro/Psych CVA, Residual Symptoms negative psych ROS   GI/Hepatic negative GI ROS, Neg liver ROS,   Endo/Other  negative endocrine ROS  Renal/GU negative Renal ROS  negative genitourinary   Musculoskeletal   Abdominal   Peds  Hematology negative hematology ROS (+)   Anesthesia Other Findings   Reproductive/Obstetrics negative OB ROS                            Anesthesia Physical Anesthesia Plan  ASA: 3 and emergent  Anesthesia Plan: General   Post-op Pain Management:    Induction: Intravenous, Rapid sequence and Cricoid pressure planned  PONV Risk Score and Plan: 2 and Ondansetron and Dexamethasone  Airway Management Planned: Oral ETT  Additional Equipment: Arterial line  Intra-op Plan:   Post-operative Plan: Possible Post-op intubation/ventilation  Informed Consent: I have reviewed the patients History and Physical, chart, labs and discussed the procedure including the risks, benefits and alternatives for the proposed anesthesia with the patient or authorized representative who has indicated his/her understanding and acceptance.     Dental advisory given  Plan Discussed with: CRNA  Anesthesia Plan Comments:         Anesthesia Quick  Evaluation

## 2022-05-10 NOTE — ED Notes (Signed)
Pt transferring to [facility] IR via emergency traffic with [facility]. Departing [facility] ED at this time. Unable to give report to IR> Report given to [facility] RN.

## 2022-05-10 NOTE — Sedation Documentation (Signed)
Pt transported to 4N23 via bed accompanied by RN and CRNA. Jerico RN at bedside to receive pt. Handoff complete. Right groin remains level 0. Left lower distal pulses palpable and right lower distal pulses absent. VSS. No s/s of distress at this time.

## 2022-05-10 NOTE — Progress Notes (Addendum)
Code stroke activated at 1941 EDP not visualized on screen Pt not visualized prior to going to CT Pt returned from CT at 2033 [place] paged at 1955 Bedside reports verbal order for TNK given at 2022 Informed bedside pt had recent stroke as of 03/08/2022. Bedside reports [place] aware and discussed with family.  mRs 1

## 2022-05-10 NOTE — ED Notes (Signed)
IR consent at bedside

## 2022-05-10 NOTE — ED Triage Notes (Signed)
CODE STROKE   LKN 1830 05/10/2022  HX CVA. New onset of slurred speech, severe asphasia. ETOH on board.

## 2022-05-10 NOTE — Progress Notes (Signed)
Spoke with Dr. Theda Sers about pt's BP goal. Pt at 21 on cleviprex; verbal order to increase cleviprex with a max dose of 32.

## 2022-05-10 NOTE — ED Notes (Signed)
Neurology obtaining collateral information from [facility]. Brother can be reached at [phone]

## 2022-05-10 NOTE — ED Provider Notes (Signed)
[facility] Provider Note    Event Date/Time   First MD Initiated Contact with Patient 05/10/22 1957     (approximate)   History   Code Stroke   HPI  James Lambert is a 56 y.o. male  who, per discharge summary dated 03/21/22 had admission for acute CVA, who presents to the emergency department today because of concern for aphagia. The patient himself is unable to give any history. Last known well was apparently 1830 today. Patient was activated as a code stroke by EMS.     Physical Exam   Triage Vital Signs: ED Triage Vitals  Enc Vitals Group     BP 05/10/22 2039 (!) 166/119     Pulse Rate 05/10/22 2039 81     Resp 05/10/22 2039 (!) 27     Temp --      Temp src --      SpO2 05/10/22 2039 99 %     Weight 05/10/22 2025 235 lb 10.8 oz (106.9 kg)     Height --      Head Circumference --      Peak Flow --      Pain Score --      Pain Loc --      Pain Edu? --      Excl. in [place]? --     Most recent vital signs: Vitals:   05/10/22 2039 05/10/22 2042  BP: (!) 166/119 (!) 146/99  Pulse: 81 78  Resp: (!) 27 13  SpO2: 99% 100%    General: Awake, alert. CV:  Good peripheral perfusion. Regular rate and rhythm. Resp:  Normal effort. Lungs clear. Abd:  No distention.  Neuro:  Aphagia.    ED Results / Procedures / Treatments   Labs (all labs ordered are listed, but only abnormal results are displayed) Labs Reviewed  CBC - Abnormal; Notable for the following components:      Result Value   RBC 3.90 (*)    Hemoglobin 11.9 (*)    HCT 36.3 (*)    All other components within normal limits  COMPREHENSIVE METABOLIC PANEL - Abnormal; Notable for the following components:   Sodium 134 (*)    CO2 20 (*)    Calcium 8.5 (*)    Total Protein 8.5 (*)    Albumin 3.2 (*)    AST 14 (*)    All other components within normal limits  ETHANOL - Abnormal; Notable for the following components:   Alcohol, Ethyl (B) 42 (*)    All other components within  normal limits  PROTIME-INR  APTT  DIFFERENTIAL  URINE DRUG SCREEN, QUALITATIVE ([facility] ONLY)  CBG MONITORING, ED  I-STAT CREATININE, ED     EKG  I, Phineas Semen, attending physician, personally viewed and interpreted this EKG  EKG Time: 2028 Rate: 78 Rhythm: sinus rhythm Axis: normal Intervals: qtc 445 QRS: narrow, q waves III, v1 ST changes: no st elevation Impression: abnormal ekg    RADIOLOGY I independently interpreted and visualized the CT head. My interpretation: No bleed Radiology interpretation:    IMPRESSION:  1. No acute intracranial abnormality  2. Ten.  ASPECTS is  3. Age-related cerebral atrophy with chronic left parietal, left  basal ganglia, and left cerebellar infarcts.    I independently interpreted and visualized the CT angio head/neck . My interpretation: No bleed Radiology interpretation:  IMPRESSION:  1. Positive CTA for emergent large vessel occlusion, with abrupt  occlusion of the proximal cervical left  ICA just distal to the  bifurcation. Distal reconstitution at the carotid siphon with  probable small amount of thrombus/clot protruding into the lumen at  the supraclinoid left ICA. No other visible downstream embolic left  MCA occlusion.  2. 4 cc acute core infarct at the anterior left frontal lobe, with  large surrounding ischemic penumbra, mismatch volume measures 236  mL.  3. Atheromatous change about the right carotid bulb/proximal right  ICA with associated stenosis of up to 55% by NASCET criteria.  4. Extensive atheromatous change throughout the intracranial  circulation, with associated moderate to severe multifocal stenoses  involving the right carotid siphon, V4 segments, and basilar artery  as above.    PROCEDURES:  Critical Care performed: Yes, see critical care procedure note(s)  Procedures  CRITICAL CARE Performed by: Phineas Semen   Total critical care time: 20 minutes  Critical care time was exclusive of  separately billable procedures and treating other patients.  Critical care was necessary to treat or prevent imminent or life-threatening deterioration.  Critical care was time spent personally by me on the following activities: development of treatment plan with patient and/or surrogate as well as nursing, discussions with consultants, evaluation of patient's response to treatment, examination of patient, obtaining history from patient or surrogate, ordering and performing treatments and interventions, ordering and review of laboratory studies, ordering and review of radiographic studies, pulse oximetry and re-evaluation of patient's condition.   MEDICATIONS ORDERED IN ED: Medications  clevidipine (CLEVIPREX) infusion 0.5 mg/mL (6 mg/hr Intravenous Rate/Dose Change 05/10/22 2047)  sodium chloride flush (NS) 0.9 % injection 3 mL (3 mLs Intravenous Given 05/10/22 2032)  tenecteplase (TNKASE) injection for Stroke 25 mg (25 mg Intravenous Given 05/10/22 2033)  iohexol (OMNIPAQUE) 350 MG/ML injection 100 mL (100 mLs Intravenous Contrast Given 05/10/22 2023)  labetalol (NORMODYNE) 5 MG/ML injection (  Given 05/10/22 2030)  sodium chloride flush (NS) 0.9 % injection 3 mL (3 mLs Intravenous Given 05/10/22 2034)     IMPRESSION / MDM / ASSESSMENT AND PLAN / ED COURSE  I reviewed the triage vital signs and the nursing notes.                              Differential diagnosis includes, but is not limited to, ischemic stroke, intracranial bleed, anemia, infection.   Patient's presentation is most consistent with acute presentation with potential threat to life or bodily function.  Patient presented to the emergency department today because of concerns for aphasia.  Patient is unable to give any history.  Patient was called a code stroke. Dr. Otelia Limes with neurology was present shortly after the patient's arrival and was with the patient at CT scan.  He did order thrombolytics.  Given findings of CT scan patient  was deemed a candidate for IR intervention.  Because of this he was transferred to [facility].  FINAL CLINICAL IMPRESSION(S) / ED DIAGNOSES   Final diagnoses:  Cerebrovascular accident (CVA), unspecified mechanism (HCC)     Note:  This document was prepared using Dragon voice recognition software and may include unintentional dictation errors.    Phineas Semen, MD 05/10/22 2258

## 2022-05-10 NOTE — ED Notes (Signed)
Witnessed phone consent of brother Godfrey Pick who gives consent to transport pt via ambulance to [facility] for interventional radiology.

## 2022-05-10 NOTE — Consult Note (Signed)
NEURO HOSPITALIST CONSULT NOTE   Requestig physician: Dr. Derrill Kay  Reason for Consult: Acute onset of confusion and aphasia  History obtained from:  EMS and Chart     HPI:                                                                                                                                          James Lambert is an 56 y.o. male with a PMHx of HTN, COPD, hidradenitis suppurativa and stroke with residual right sided weakness who presents from his group home after his brother, who had been drinking with him there, noted the patient to suddenly become confused and aphasic. EMS was called and on arrival they noted same. Brother stated that although thye were drinking, the patient's alcohol intake today was no different than on other days. Code Stroke was called in the field.   Neurologist spoke with the patient's brother James Lambert by telephone [phone]). Patient's brother states that the patient had "4 or 5 strokes" in the last 3 months, but that at baseline the patient has no easily discernible deficits, other than that a friend is able to note mild right sided weakness. Prior to onset of the symptoms above, the patient was "walking and talking like he normally does".   Patient's brother states that at 85, while drinking together, James Lambert suddenly started not making sense and also appeared to have a right facial droop, so EMS was called. On EMS arrival, the patient was aphasic with right sided weakness.   Patient's brother also states that the patient's daily intake of EtOH is approximately 120 ounces of beer and and 12 ounces of hard liquor. This evening, the patient had only drunk 2 ounces of hard liquor prior to onset of his symptoms.   Past Medical History:  Diagnosis Date   Asthma    COPD (chronic obstructive pulmonary disease) (HCC)    Hidradenitis suppurativa    diagnosed in [facility] ED based on history and physical exam   Hypertension     Past  Surgical History:  Procedure Laterality Date   IR FLUORO GUIDED NEEDLE PLC ASPIRATION/INJECTION LOC  03/20/2022   TEE WITHOUT CARDIOVERSION N/A 03/11/2022   Procedure: TRANSESOPHAGEAL ECHOCARDIOGRAM (TEE);  Surgeon: Lamar Blinks, MD;  Location: [facility] ORS;  Service: Cardiovascular;  Laterality: N/A;    No family history on file.         Social History:  reports that he has been smoking cigarettes. He has been smoking an average of .5 packs per day. He has never used smokeless tobacco. He reports current alcohol use. He reports current drug use. Drug: Marijuana.  No Known Allergies  MEDICATIONS:  No current facility-administered medications on file prior to encounter.   Current Outpatient Medications on File Prior to Encounter  Medication Sig Dispense Refill   amLODipine (NORVASC) 10 MG tablet Take 1 tablet (10 mg total) by mouth once daily. 30 tablet 3   amoxicillin-clavulanate (AUGMENTIN) 875-125 MG tablet Take 1 tablet by mouth 2 (two) times daily. 20 tablet 0   aspirin 81 MG chewable tablet Chew 1 tablet (81 mg total) by mouth once daily. 90 tablet 0   atorvastatin (LIPITOR) 80 MG tablet Take 1 tablet (80 mg total) by mouth once daily. 90 tablet 0   clopidogrel (PLAVIX) 75 MG tablet Take 1 tablet (75 mg total) by mouth once daily. 90 tablet 0   HYDROcodone-acetaminophen (NORCO/VICODIN) 5-325 MG tablet Take 1 tablet by mouth every 6 (six) hours as needed for moderate pain. 15 tablet 0   metoprolol succinate (TOPROL-XL) 25 MG 24 hr tablet Take 1 tablet (25 mg total) by mouth once daily. 30 tablet 2     ROS:                                                                                                                                       Unable to obtain due to AMS.   BP (!) 153/95   Pulse 86   Temp 97.7 F (36.5 C)   Resp (!) 21   Wt 106.9 kg   SpO2 99%   BMI  30.26 kg/m    General Examination:                                                                                                       Physical Exam  HEENT-  [place]/AT    Lungs- Respirations unlabored  Extremities- Warm and well perfused  Neurological Examination Mental Status: Awake with decreased level of alertness. Mute except for uttering "ow" during noxious stimulus. Does not follow any commands.  Cranial Nerves: II: No blink to threat on the left. PERRL.   III,IV, VI: No ptosis. Right sided gaze preference; cannot track to the right and also with no spontaneous saccades to the right. Can be overcome with oculocephalic maneuver. No nystagmus.  V: Decreased reactivity to right sided stimuli. Brisk reactivity to left sided brow ridge pressure.  VII: Right facial droop VIII: Unable to formally assess. Does not significantly react to vocal stimuli.  IX,X: Unable to assess XI: Head preferentially rotated to the left XII: Unable to assess Motor: RUE without spontaneous movements. Will move  slightly to noxious. Falls to bed immediately after release.  RLE withdraws to noxious, but less briskly than on the left.  LUE and LLE 5/5 Sensory: Reacts less briskly to noxious applied to RUE and RLE Deep Tendon Reflexes: 2+ and symmetric throughout Plantars: Upgoing bilaterally  Cerebellar: No ataxia disproportionate to weakness Gait: Unable to assess   Lab Results: Basic Metabolic Panel: No results for input(s): NA, K, CL, CO2, GLUCOSE, BUN, CREATININE, CALCIUM, MG, PHOS in the last 168 hours.  CBC: No results for input(s): WBC, NEUTROABS, HGB, HCT, MCV, PLT in the last 168 hours.  Cardiac Enzymes: No results for input(s): CKTOTAL, CKMB, CKMBINDEX, TROPONINI in the last 168 hours.  Lipid Panel: No results for input(s): CHOL, TRIG, HDL, CHOLHDL, VLDL, LDLCALC in the last 168 hours.  Imaging: No results found.   Assessment: 56 year old male with a history of stroke with residual  right sided weakness presenting with acute onset of confusion and aphasia.  1. Exam reveals neurological findings that best localize as a large left MCA lesion, most likely an acute stroke.  2. CT head: No acute intracranial abnormality. ASPECTS is 10. 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  3. CTA of head and neck: Positive for emergent large vessel occlusion, with abrupt occlusion of the proximal cervical left ICA just distal to the bifurcation. Distal reconstitution at the carotid siphon with probable small amount of thrombus/clot protruding into the lumen at the supraclinoid left ICA. No other visible downstream embolic left MCA occlusion. Atheromatous change about the right carotid bulb/proximal right ICA with associated stenosis of up to 55% by NASCET criteria. Extensive atheromatous change throughout the intracranial circulation, with associated moderate to severe multifocal stenoses involving the right carotid siphon, V4 segments, and basilar artery. 4. CTP: 4 cc acute core infarct at the anterior left frontal lobe, with large surrounding ischemic penumbra, mismatch volume measures 236 mL.  5. After comprehensive review of possible contraindications, he has no absolute contraindications to TNK administration 6. The patient is an IV thrombolysis candidate. Discussed extensively the risks/benefits of IV thrombolysis treatment vs. no treatment with the patient's brother, James Lambert [phone]), including risks of hemorrhage and death with IV thrombolysis administration versus worse overall outcomes on average in patients within thrombolysis time window who are not administered TNK. The patient's aphasia precludes meaningful medical decision making on his part at this time. Overall benefits of TNK regarding long-term prognosis are felt to outweigh risks. The patient's brother expressed understanding and wish to proceed with TNK (telephone  consent witnessed by Sheela Stack, RN).  7. The patient is a VIR candidate. Risks/benefits of the procedure were discussed extensively with patient's brother, including approximately 50% chance of significant improvement relative to an approximate 10% chance of subarachnoid hemorrhage with possibility of significant worsening including death. The patient's brother expressed understanding and provided informed consent to proceed with VIR. All questions answered.   8. Code IR called out to [facility]. Discussed by telephone with Dr. Nyra Jabs.  9. I have signed out the patient to Dr. Thomasena Edis, who is covering [facility] Neurology tonight and who will be admitting the patient.  10. Heavy daily EtOH use per brother.     Recommendations: 1. Transferring emergently to [facility]. Admit to the Neuro ICU under the Neurology service after thrombectomy at [facility].  2. Post-TNK and thrombectomy order set to include frequent neuro checks and BP management.  3. No antiplatelet medications or anticoagulants for at least 24  hours following TNK and thrombectomy.  4. DVT prophylaxis with SCDs.  5. Continue atorvastatin.  6. Restart his home DAPT regimen if follow up CT at 24 hours is negative for hemorrhagic conversion. 7. Cardiac telemetry 8. TTE.  9. MRI brain  10. PT/OT/Speech.  11. NPO until passes swallow evaluation.  12. Fasting lipid panel, HgbA1c 13. CIWA protocol with PRN Ativan order due to high likelihood of EtOH withdrawal.    120 minutes spent in the emergent neurological evaluation and management of this critically ill patient.   Electronically signed: Dr. Caryl Pina 05/10/2022, 7:59 PM

## 2022-05-10 NOTE — Transfer of Care (Signed)
Immediate Anesthesia Transfer of Care Note  Patient: James Lambert  Procedure(s) Performed: IR WITH ANESTHESIA  Patient Location: PACU and ICU  Anesthesia Type:General  Level of Consciousness: awake and alert   Airway & Oxygen Therapy: Patient Spontanous Breathing and Patient connected to face mask oxygen  Post-op Assessment: Report given to RN and Post -op Vital signs reviewed and stable  Post vital signs: Reviewed and stable  Last Vitals:  Vitals Value Taken Time  BP 167/98 05/10/22 2300  Temp    Pulse 82 05/10/22 2302  Resp 20 05/10/22 2302  SpO2 98 % 05/10/22 2302  Vitals shown include unvalidated device data.  Last Pain: There were no vitals filed for this visit.       Complications: No notable events documented.

## 2022-05-10 NOTE — Procedures (Signed)
INTERVENTIONAL NEURORADIOLOGY BRIEF POSTPROCEDURE NOTE  DIAGNOSTIC CEREBRAL ANGIOGRAM MECHANICAL THROMBECTOMY FLAT PANEL HEAD CT  Attending: Dr. Baldemar Lenis  Assistant: None.  Diagnosis: Left ICA occlusion.  Access site: RCFA, 45F  Access closure: Perclose Prostyle  Anesthesia: GETA  Medication used: Refer to anesthesia documentation.  Complications: None.  Estimated blood loss: 50 mL.  Specimen: None.  Findings: No contrast opacification of the left ICA from the bulb, likely related to intracranial occlusion with flow stagnation. Mechanical thrombectomy performed with direct contact aspiration (1 pass) with complete recanalization (TICI3). No evidence of thromboembolic or hemorrhagic complication.  The patient tolerated the procedure well without incident or complication and is in stable condition.   PLAN: - bed rest x6 hours post femoral puncture - SBP 120-140 mmHg.

## 2022-05-10 NOTE — ED Notes (Signed)
This RN spoke to James Lambert pts brother at [phone]). James Lambert gives consent for Kaige to transfer to [facility]. Consent by phone overheard and given to April RN as well.

## 2022-05-10 NOTE — ED Notes (Signed)
Pt cleared for CT by EDP

## 2022-05-10 NOTE — ED Notes (Signed)
Neurology Dr Otelia Limes arrived to CT

## 2022-05-10 NOTE — Anesthesia Procedure Notes (Signed)
Procedure Name: Intubation Date/Time: 05/10/2022 9:51 PM Performed by: Trinna Post., CRNA Pre-anesthesia Checklist: Patient identified, Emergency Drugs available, Suction available, Patient being monitored and Timeout performed Patient Re-evaluated:Patient Re-evaluated prior to induction Oxygen Delivery Method: Circle system utilized Preoxygenation: Pre-oxygenation with 100% oxygen Induction Type: IV induction, Rapid sequence and Cricoid Pressure applied Laryngoscope Size: Mac and 4 Grade View: Grade II Tube type: Oral Tube size: 7.5 mm Number of attempts: 1 Airway Equipment and Method: Stylet Placement Confirmation: ETT inserted through vocal cords under direct vision, positive ETCO2 and breath sounds checked- equal and bilateral Secured at: 23 cm Tube secured with: Tape Dental Injury: Teeth and Oropharynx as per pre-operative assessment

## 2022-05-11 ENCOUNTER — Inpatient Hospital Stay (HOSPITAL_COMMUNITY): Payer: Medicaid Other

## 2022-05-11 DIAGNOSIS — G40909 Epilepsy, unspecified, not intractable, without status epilepticus: Secondary | ICD-10-CM

## 2022-05-11 DIAGNOSIS — I6389 Other cerebral infarction: Secondary | ICD-10-CM

## 2022-05-11 DIAGNOSIS — I63232 Cerebral infarction due to unspecified occlusion or stenosis of left carotid arteries: Principal | ICD-10-CM

## 2022-05-11 DIAGNOSIS — F1010 Alcohol abuse, uncomplicated: Secondary | ICD-10-CM

## 2022-05-11 DIAGNOSIS — Z9282 Status post administration of tPA (rtPA) in a different facility within the last 24 hours prior to admission to current facility: Secondary | ICD-10-CM

## 2022-05-11 DIAGNOSIS — I10 Essential (primary) hypertension: Secondary | ICD-10-CM

## 2022-05-11 DIAGNOSIS — I161 Hypertensive emergency: Secondary | ICD-10-CM

## 2022-05-11 DIAGNOSIS — R4701 Aphasia: Secondary | ICD-10-CM

## 2022-05-11 LAB — LIPID PANEL
Cholesterol: 140 mg/dL (ref 0–200)
HDL: 32 mg/dL — ABNORMAL LOW (ref >40)
LDL Cholesterol: 91 mg/dL (ref 0–99)
Total CHOL/HDL Ratio: 4.4 RATIO
Triglycerides: 87 mg/dL (ref <150)
VLDL: 17 mg/dL (ref 0–40)

## 2022-05-11 LAB — RAPID URINE DRUG SCREEN, HOSP PERFORMED
Amphetamines: NOT DETECTED
Barbiturates: NOT DETECTED
Benzodiazepines: NOT DETECTED
Cocaine: NOT DETECTED
Opiates: NOT DETECTED
Tetrahydrocannabinol: POSITIVE — AB

## 2022-05-11 LAB — COMPREHENSIVE METABOLIC PANEL
ALT: 10 U/L (ref 0–44)
AST: 12 U/L — ABNORMAL LOW (ref 15–41)
Albumin: 3.3 g/dL — ABNORMAL LOW (ref 3.5–5.0)
Alkaline Phosphatase: 74 U/L (ref 38–126)
Anion gap: 10 (ref 5–15)
BUN: 7 mg/dL (ref 6–20)
CO2: 20 mmol/L — ABNORMAL LOW (ref 22–32)
Calcium: 8.8 mg/dL — ABNORMAL LOW (ref 8.9–10.3)
Chloride: 101 mmol/L (ref 98–111)
Creatinine, Ser: 1.04 mg/dL (ref 0.61–1.24)
GFR, Estimated: >60 mL/min (ref >60)
Glucose, Bld: 171 mg/dL — ABNORMAL HIGH (ref 70–99)
Potassium: 3.8 mmol/L (ref 3.5–5.1)
Sodium: 131 mmol/L — ABNORMAL LOW (ref 135–145)
Total Bilirubin: 0.2 mg/dL — ABNORMAL LOW (ref 0.3–1.2)
Total Protein: 8.1 g/dL (ref 6.5–8.1)

## 2022-05-11 LAB — ETHANOL: Alcohol, Ethyl (B): <10 mg/dL (ref <10)

## 2022-05-11 LAB — ABO/RH: ABO/RH(D): A POS

## 2022-05-11 LAB — CBC
HCT: 37.6 % — ABNORMAL LOW (ref 39.0–52.0)
Hemoglobin: 12.8 g/dL — ABNORMAL LOW (ref 13.0–17.0)
MCH: 31 pg (ref 26.0–34.0)
MCHC: 34 g/dL (ref 30.0–36.0)
MCV: 91 fL (ref 80.0–100.0)
Platelets: 394 10*3/uL (ref 150–400)
RBC: 4.13 MIL/uL — ABNORMAL LOW (ref 4.22–5.81)
RDW: 13.7 % (ref 11.5–15.5)
WBC: 9.7 10*3/uL (ref 4.0–10.5)
nRBC: 0 % (ref 0.0–0.2)

## 2022-05-11 LAB — ECHOCARDIOGRAM LIMITED
Area-P 1/2: 3.89 cm2
S' Lateral: 2.8 cm
Weight: 3770.75 oz

## 2022-05-11 LAB — TYPE AND SCREEN
ABO/RH(D): A POS
Antibody Screen: NEGATIVE

## 2022-05-11 LAB — MAGNESIUM: Magnesium: 1.7 mg/dL (ref 1.7–2.4)

## 2022-05-11 LAB — PHOSPHORUS: Phosphorus: 2.5 mg/dL (ref 2.5–4.6)

## 2022-05-11 IMAGING — MR MR HEAD W/O CM
13 series · 48 of 48 positions shown · non-contrast
Comparison: [DATE]

CLINICAL DATA: Stroke, follow-up; status post neuro interventional
procedure for left ICA occlusion

EXAM:
MRI HEAD WITHOUT CONTRAST
TECHNIQUE: Multiplanar, multiecho pulse sequences of the brain and surrounding
structures were obtained without intravenous contrast.

[Series 5: DWI · axial · 3.0mm · 0.88mm/px · z∈[-90,+63]mm · 8 of 104 slices shown (1 of 4)]
[im 1/104]
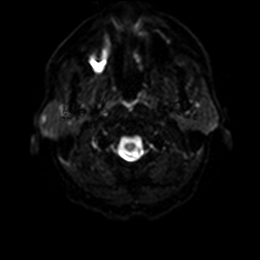
[im 15/104]
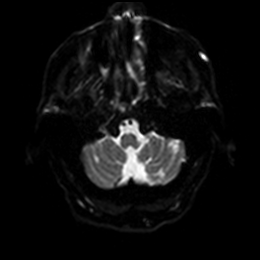
[im 30/104]
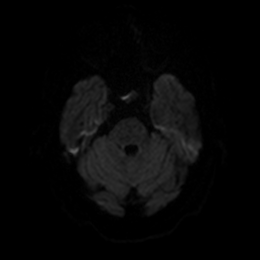
[im 45/104]
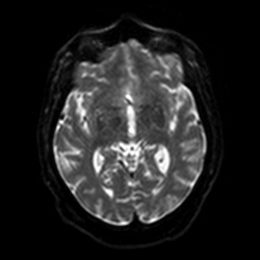
[im 59/104]
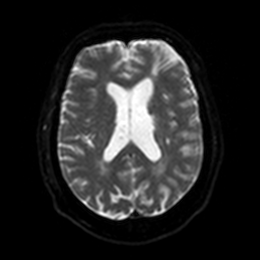
[im 74/104]
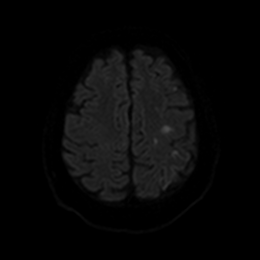
[im 89/104]
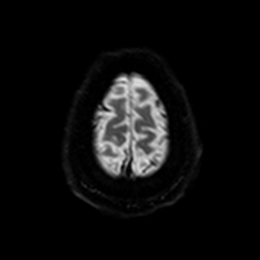
[im 104/104]
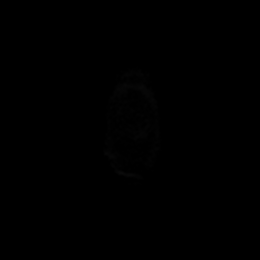

[Series 6: DWI · axial · 3.0mm · 0.88mm/px · z∈[-90,+63]mm · 4 of 52 slices shown (2 of 4)]
[im 1/52]
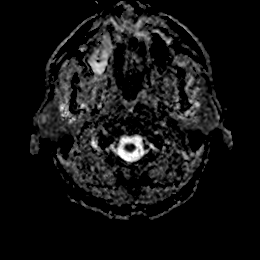
[im 18/52]
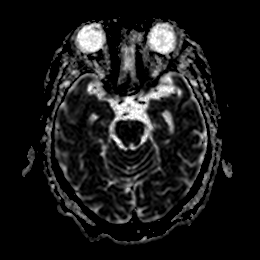
[im 35/52]
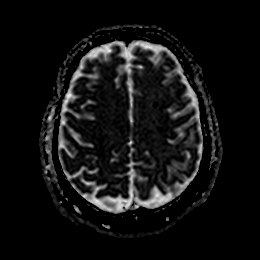
[im 52/52]
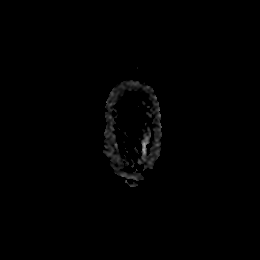

[Series 7: DWI · coronal · 4.0mm · 0.88mm/px · 6 of 76 slices shown (3 of 4)]
[im 1/76]
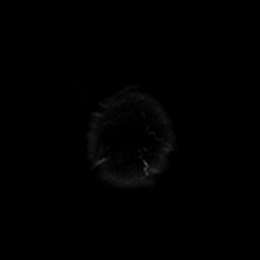
[im 16/76]
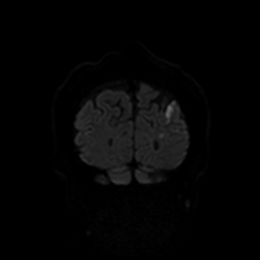
[im 31/76]
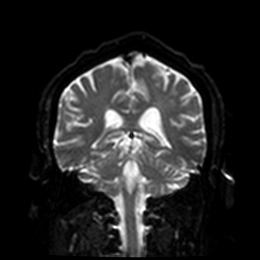
[im 46/76]
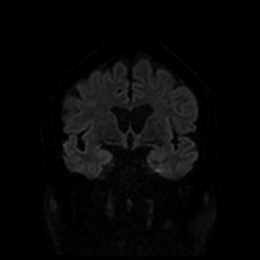
[im 61/76]
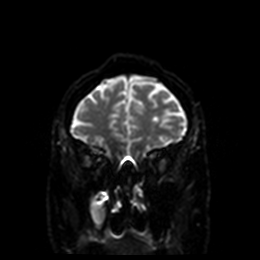
[im 76/76]
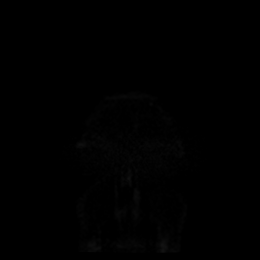

[Series 8: DWI · coronal · 4.0mm · 0.88mm/px · 3 of 38 slices shown (4 of 4)]
[im 1/38]
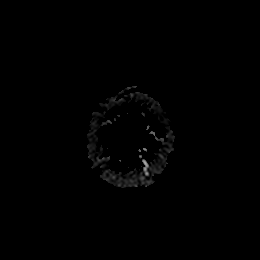
[im 19/38]
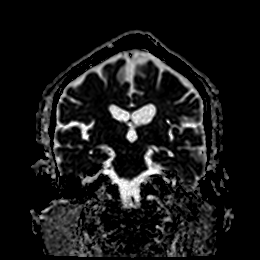
[im 38/38]
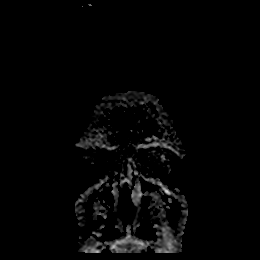

[Series 9: T1 · sagittal · 5.0mm · 0.75mm/px · 2 of 25 slices shown]
[im 1/25]
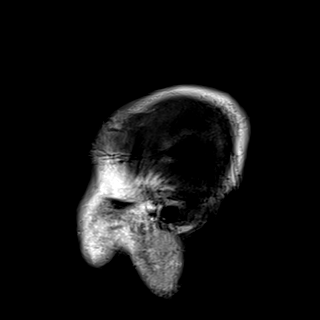
[im 25/25]
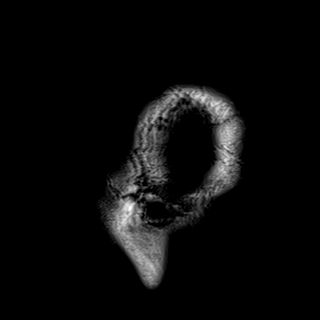

[Series 10: T2 · axial · 5.0mm · 0.72mm/px · z∈[-88,+62]mm · 2 of 26 slices shown]
[im 1/26]
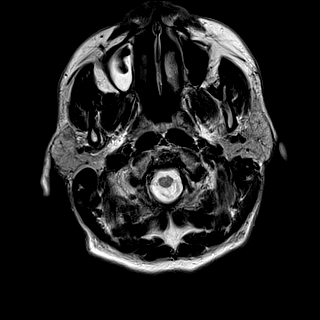
[im 26/26]
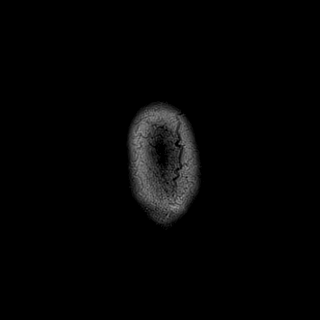

[Series 11: FLAIR · axial · 5.0mm · 0.90mm/px · z∈[-88,+62]mm · 2 of 26 slices shown (1 of 2)]
[im 1/26]
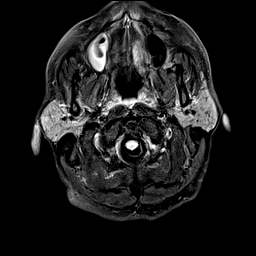
[im 26/26]
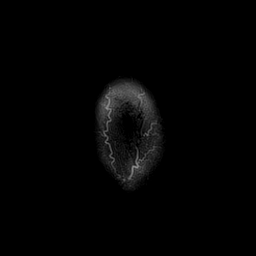

[Series 12: mag_images · axial · 3.0mm · 0.90mm/px · z∈[-89,+64]mm · 4 of 52 slices shown]
[im 1/52]
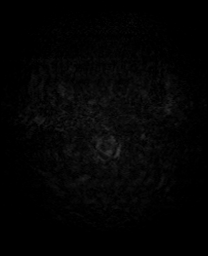
[im 18/52]
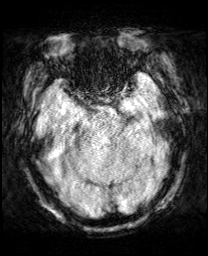
[im 35/52]
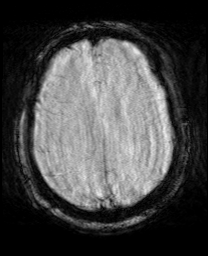
[im 52/52]
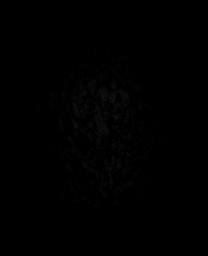

[Series 13: pha_images · axial · 3.0mm · 0.90mm/px · z∈[-86,+61]mm · 4 of 49 slices shown]
[im 1/49]
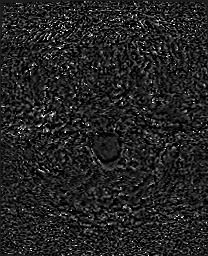
[im 17/49]
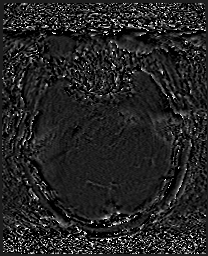
[im 33/49]
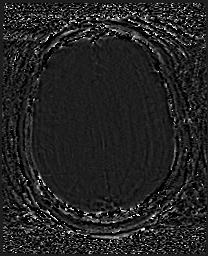
[im 49/49]
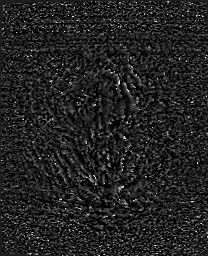

[Series 14: swi_images · axial · 3.0mm · 0.90mm/px · z∈[-89,+64]mm · 4 of 52 slices shown]
[im 1/52]
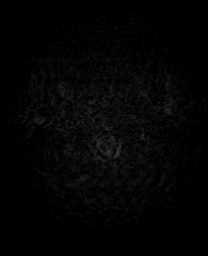
[im 18/52]
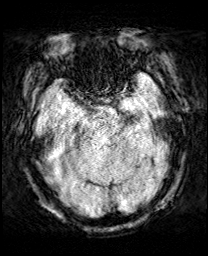
[im 35/52]
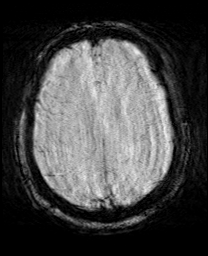
[im 52/52]
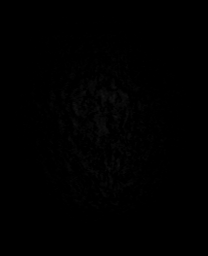

[Series 15: mip_images(sw) · axial · 24.0mm · 0.90mm/px · z∈[-78,+54]mm · 4 of 45 slices shown]
[im 1/45]
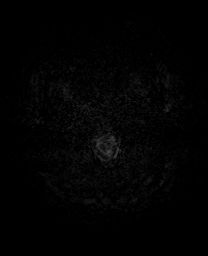
[im 15/45]
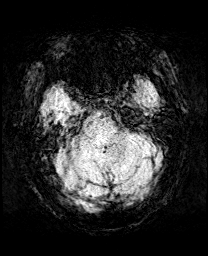
[im 30/45]
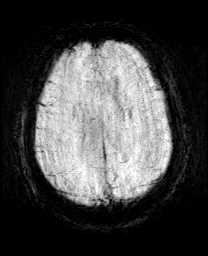
[im 45/45]
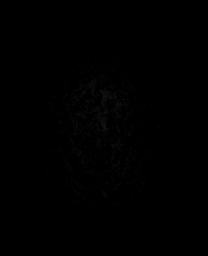

[Series 16: FLAIR · axial · 5.0mm · 0.90mm/px · z∈[-88,+62]mm · 2 of 26 slices shown (2 of 2)]
[im 1/26]
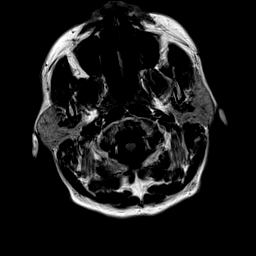
[im 26/26]
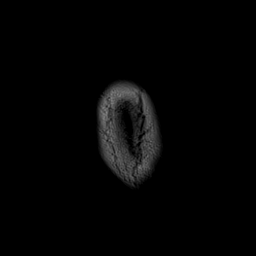

[Series 17: T2 post-contrast · coronal · 5.0mm · 0.72mm/px · 3 of 32 slices shown]
[im 1/32]
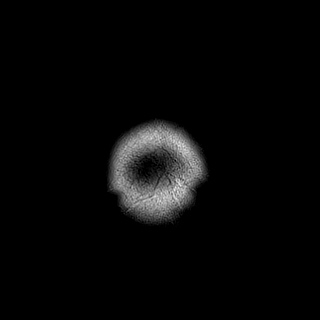
[im 16/32]
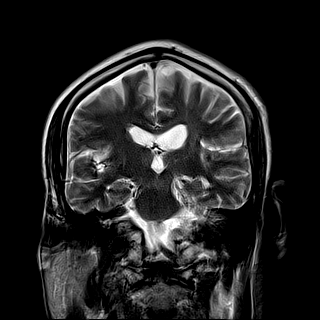
[im 32/32]
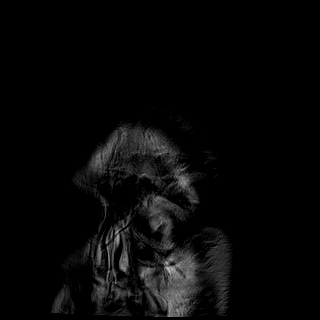

[48 of 48 positions shown; findings below may reference images not displayed]

FINDINGS: Evaluation is somewhat limited by motion artifact.

Brain: Scattered areas of restricted diffusion with ADC correlates
in the left occipital cortex (series 5, image 73 76), posterior left
temporal lobe (series 5, image 78), left parietal lobe (series 5,
images 81-86 and 89-90), and left frontal lobe cortex and white
matter (series 5, images 77, 80, 83, 87-95). Additional punctate
focus of restricted diffusion in the right posterior lentiform
nucleus (series 5, image 77).

No acute hemorrhage, mass, mass effect, or midline shift. No
hydrocephalus or extra-axial collection.

Vascular: Normal arterial flow voids.

Skull and upper cervical spine: Normal marrow signal.

Sinuses/Orbits: Mucosal thickening in the right maxillary sinus and
left sphenoid sinus. The orbits are unremarkable.

Other: The mastoids are well aerated.
IMPRESSION: Evaluation is somewhat limited by motion artifact. Within this
limitation, there are scattered small acute infarcts in the left
cerebral hemisphere, with an additional punctate acute infarct in
the right posterior lentiform nucleus. No evidence of hemorrhagic
transformation.

## 2022-05-11 MED ORDER — ASPIRIN 81 MG PO TBEC
81.0000 mg | DELAYED_RELEASE_TABLET | Freq: Every day | ORAL | Status: DC
  Administered 2022-05-11 – 2022-05-16 (×6): 81 mg via ORAL
  Filled 2022-05-11 (×6): qty 1

## 2022-05-11 MED ORDER — FOLIC ACID 1 MG PO TABS
1.0000 mg | ORAL_TABLET | Freq: Every day | ORAL | Status: DC
  Administered 2022-05-11 – 2022-05-16 (×6): 1 mg via ORAL
  Filled 2022-05-11 (×6): qty 1

## 2022-05-11 MED ORDER — THIAMINE HCL 100 MG/ML IJ SOLN: 100.0000 mg | Freq: Every day | INTRAMUSCULAR | Status: DC

## 2022-05-11 MED ORDER — LORAZEPAM 2 MG/ML IJ SOLN: 1.0000 mg | INTRAMUSCULAR | Status: DC | PRN

## 2022-05-11 MED ORDER — THIAMINE HCL 100 MG PO TABS
100.0000 mg | ORAL_TABLET | Freq: Every day | ORAL | Status: DC
  Administered 2022-05-11 – 2022-05-16 (×6): 100 mg via ORAL
  Filled 2022-05-11 (×6): qty 1

## 2022-05-11 MED ORDER — LABETALOL HCL 5 MG/ML IV SOLN
20.0000 mg | INTRAVENOUS | Status: DC | PRN
  Administered 2022-05-12 (×2): 20 mg via INTRAVENOUS
  Filled 2022-05-11 (×3): qty 4

## 2022-05-11 MED ORDER — METOPROLOL SUCCINATE ER 25 MG PO TB24
25.0000 mg | ORAL_TABLET | Freq: Every day | ORAL | Status: DC
  Administered 2022-05-11 – 2022-05-16 (×6): 25 mg via ORAL
  Filled 2022-05-11 (×6): qty 1

## 2022-05-11 MED ORDER — LORAZEPAM 1 MG PO TABS: 1.0000 mg | ORAL_TABLET | ORAL | Status: DC | PRN

## 2022-05-11 MED ORDER — AMLODIPINE BESYLATE 10 MG PO TABS
10.0000 mg | ORAL_TABLET | Freq: Every day | ORAL | Status: DC
  Administered 2022-05-11 – 2022-05-16 (×6): 10 mg via ORAL
  Filled 2022-05-11 (×6): qty 1

## 2022-05-11 MED ORDER — CHLORHEXIDINE GLUCONATE CLOTH 2 % EX PADS
6.0000 | MEDICATED_PAD | Freq: Every day | CUTANEOUS | Status: DC
  Administered 2022-05-11 – 2022-05-15 (×5): 6 via TOPICAL

## 2022-05-11 MED ORDER — ADULT MULTIVITAMIN W/MINERALS CH
1.0000 | ORAL_TABLET | Freq: Every day | ORAL | Status: DC
Start: 2022-05-11 — End: 2022-05-16
  Administered 2022-05-11 – 2022-05-16 (×6): 1 via ORAL
  Filled 2022-05-11 (×6): qty 1

## 2022-05-11 MED ORDER — LABETALOL HCL 5 MG/ML IV SOLN
10.0000 mg | Freq: Once | INTRAVENOUS | Status: AC
  Administered 2022-05-11: 10 mg via INTRAVENOUS
  Filled 2022-05-11: qty 4

## 2022-05-11 NOTE — Evaluation (Signed)
Physical Therapy Evaluation Patient Details Name: James Lambert MRN: [id number] DOB: 09-13-1966 Today's Date: 05/11/2022  History of Present Illness  56 y.o. male presents to [facility] hospital 05/10/2022 with AMS and aphasia. Of note pt with 2 admissions in April 2023 with CVAs. CTA demonstrates L ICA occlusion. Pt received TNK and underwent thrombectomy on 6/3. PMH includes asthma, COPD, HTN, CVA.  Clinical Impression  Pt presents to PT with deficits in communication, cognition, perception, gait, balance, endurance. Pt presents with both receptive and expressive language deficits, having much difficulty reporting symptoms, at times repeating what therapists state during session. Pt appears to have R inattention, often raising left side when requested to mobilize right side. Pt is at a high risk for falls due to impaired balance and reduced awareness of deficits. PT recommends AIR admission at this time.     Recommendations for follow up therapy are one component of a multi-disciplinary discharge planning process, led by the attending physician.  Recommendations may be updated based on patient status, additional functional criteria and insurance authorization.  Follow Up Recommendations Acute inpatient rehab (3hours/day)    Assistance Recommended at Discharge Frequent or constant Supervision/Assistance  Patient can return home with the following  Two people to help with walking and/or transfers;A lot of help with bathing/dressing/bathroom;Assistance with cooking/housework;Assistance with feeding;Direct supervision/assist for medications management;Direct supervision/assist for financial management;Assist for transportation;Help with stairs or ramp for entrance    Equipment Recommendations  (TBD)  Recommendations for Other Services  Rehab consult    Functional Status Assessment Patient has had a recent decline in their functional status and demonstrates the ability to make significant improvements in  function in a reasonable and predictable amount of time.     Precautions / Restrictions Precautions Precautions: Fall Restrictions Weight Bearing Restrictions: No      Mobility  Bed Mobility Overal bed mobility: Needs Assistance Bed Mobility: Supine to Sit     Supine to sit: Min guard, HOB elevated          Transfers Overall transfer level: Needs assistance Equipment used: 1 person hand held assist, None Transfers: Sit to/from Stand, Bed to chair/wheelchair/BSC Sit to Stand: Min assist, +2 physical assistance (progressing to minA for safety)   Step pivot transfers: Min guard       General transfer comment: pt with difficulty extending trunk, performing step pivot in flexed position at trunk, utilizing UE support of bed rail and armrest for support. Quick descent into sitting with stand to sit    Ambulation/Gait Ambulation/Gait assistance:  (deferred 2/2 safety concerns)                Stairs            Wheelchair Mobility    Modified Rankin (Stroke Patients Only)       Balance Overall balance assessment: Needs assistance Sitting-balance support: No upper extremity supported, Feet supported Sitting balance-Leahy Scale: Fair     Standing balance support: Single extremity supported Standing balance-Leahy Scale: Poor Standing balance comment: minG-minA, pt resistant to assistance from therapists, benefits from UE support of an assistive device or railing                             Pertinent Vitals/Pain Pain Assessment Pain Assessment: Faces Faces Pain Scale: Hurts whole lot Pain Location: L foot but pt reports of pain location fluctuate throughout session Pain Descriptors / Indicators: Aching Pain Intervention(s): Monitored during session  Home Living Family/patient expects to be discharged to:: Private residence Living Arrangements: Other relatives (brother) Available Help at Discharge: Family;Friend(s);Available  PRN/intermittently Type of Home: Apartment (pt reports apartment, notes in chart indicate he came from a group home) Home Access: Stairs to enter Entrance Stairs-Rails: Can reach both Entrance Stairs-Number of Steps: 5   Home Layout: One level Home Equipment: BSC/3in1;Shower seat;Grab bars - tub/shower;Cane - single point;Rollator (4 wheels);Other (comment) (3 wheeled walker with seat) Additional Comments: pt reports living in an apartment, notes earlier in admission reports the pt came from a group home    Prior Function Prior Level of Function : Needs assist             Mobility Comments: pt reports he was ambulating with use of a cane. Pt does not drive, friend takes him when transportation is needed ADLs Comments: pt reports independence with ADLs, utilizes shower seat     Hand Dominance   Dominant Hand: Right    Extremity/Trunk Assessment   Upper Extremity Assessment Upper Extremity Assessment: Defer to OT evaluation    Lower Extremity Assessment Lower Extremity Assessment: LLE deficits/detail;RLE deficits/detail RLE Deficits / Details: pt with 4/5 strength grossly, ROM WFL LLE Deficits / Details: pt with functional ROM and strength, pt reports pain intermittently in L foot, then left lower leg, later no pain    Cervical / Trunk Assessment Cervical / Trunk Assessment: Normal  Communication   Communication: Receptive difficulties;Expressive difficulties  Cognition Arousal/Alertness: Awake/alert Behavior During Therapy: Impulsive Overall Cognitive Status: Difficult to assess Area of Impairment: Orientation, Following commands, Safety/judgement, Awareness, Problem solving                 Orientation Level: Disoriented to, Time (initially reports 2003, then reports 2023. Otherwise oriented to month)     Following Commands: Follows one step commands inconsistently, Follows one step commands with increased time Safety/Judgement: Decreased awareness of safety,  Decreased awareness of deficits Awareness: Intellectual Problem Solving: Slow processing, Difficulty sequencing, Requires verbal cues, Requires tactile cues          General Comments General comments (skin integrity, edema, etc.): VSS on RA, pt appears to have R inattention and possible vision deficits which will require further investigation. Pt reports vision deficits in left eye from previous CVA, however pt with more difficulty seeing objects on R side during session    Exercises     Assessment/Plan    PT Assessment Patient needs continued PT services  PT Problem List Decreased strength;Decreased activity tolerance;Decreased balance;Decreased mobility;Decreased cognition;Decreased knowledge of use of DME;Decreased safety awareness;Decreased knowledge of precautions;Impaired sensation;Pain       PT Treatment Interventions DME instruction;Gait training;Stair training;Functional mobility training;Therapeutic activities;Therapeutic exercise;Balance training;Neuromuscular re-education;Patient/family education    PT Goals (Current goals can be found in the Care Plan section)  Acute Rehab PT Goals Patient Stated Goal: to return to prior level of function PT Goal Formulation: With patient Time For Goal Achievement: 05/25/22 Potential to Achieve Goals: Fair    Frequency Min 4X/week     Co-evaluation               AM-PAC PT "6 Clicks" Mobility  Outcome Measure Help needed turning from your back to your side while in a flat bed without using bedrails?: A Little Help needed moving from lying on your back to sitting on the side of a flat bed without using bedrails?: A Little Help needed moving to and from a bed to a chair (including a wheelchair)?: A Little  Help needed standing up from a chair using your arms (e.g., wheelchair or bedside chair)?: A Little Help needed to walk in hospital room?: Total Help needed climbing 3-5 steps with a railing? : Total 6 Click Score: 14     End of Session Equipment Utilized During Treatment: Gait belt Activity Tolerance: Patient tolerated treatment well Patient left: in chair;with call bell/phone within reach;with chair alarm set Nurse Communication: Mobility status PT Visit Diagnosis: Unsteadiness on feet (R26.81);Other abnormalities of gait and mobility (R26.89);Other symptoms and signs involving the nervous system (R29.898)    Time: [id number] PT Time Calculation (min) (ACUTE ONLY): 29 min   Charges:   PT Evaluation $PT Eval Moderate Complexity: 1 Mod          Zenaida Niece, PT, DPT Acute Rehabilitation Office [phone]   Zenaida Niece 05/11/2022, 11:17 AM

## 2022-05-11 NOTE — Progress Notes (Signed)
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 Tommie Samse Macedo Rodrigues. Complete recanalization achieved.    Subjective: Patient sitting up in bed awake/alert and finishing up his breakfast.   Allergies: Patient has no known allergies.  Medications: Prior to Admission medications   Medication Sig Start Date End Date Taking? Authorizing Provider  amLODipine (NORVASC) 10 MG tablet Take 1 tablet (10 mg total) by mouth once daily. 03/21/22  Yes Lewie ChamberGirguis, David, MD  amoxicillin-clavulanate (AUGMENTIN) 875-125 MG tablet Take 1 tablet by mouth 2 (two) times daily. Patient taking differently: Take 1 tablet by mouth 2 (two) times daily. 10 day course. Pt has 3 tablets left. 05/01/22  Yes Bridget HartshornSummers, Rhonda L, PA-C  metoprolol succinate (TOPROL-XL) 25 MG 24 hr tablet Take 1 tablet (25 mg total) by mouth once daily. 03/11/22  Yes Darlin PriestlyLai, Tina, MD  aspirin 81 MG chewable tablet Chew 1 tablet (81 mg total) by mouth once daily. Patient not taking: Reported on 05/11/2022 03/20/22   Lewie ChamberGirguis, David, MD  atorvastatin (LIPITOR) 80 MG tablet Take 1 tablet (80 mg total) by mouth once daily. Patient not taking: Reported on 05/11/2022 03/12/22   Darlin PriestlyLai, Tina, MD  clopidogrel (PLAVIX) 75 MG tablet Take 1 tablet (75 mg total) by mouth once daily. Patient not taking: Reported on 05/11/2022 03/12/22   Darlin PriestlyLai, Tina, MD  HYDROcodone-acetaminophen (NORCO/VICODIN) 5-325 MG tablet Take 1 tablet by mouth every 6 (six) hours as needed for moderate pain. Patient not taking: Reported on 05/11/2022 05/01/22 05/01/23  Tommi RumpsSummers, Rhonda L, PA-C     Vital Signs: BP 139/71   Pulse 79   Temp 98.1 F (36.7 C) (Oral)   Resp 19   SpO2 96%   Physical Exam Constitutional:      General: He is not in acute distress.    Appearance: He is not ill-appearing.  Cardiovascular:     Rate and Rhythm: Normal  rate and regular rhythm.     Pulses: Normal pulses.     Comments: Right femoral vascular access site is clean, soft, dry and non-tender. Dressing is clean/dry/intact. Palpable dorsalis pedis pulses.  Pulmonary:     Effort: Pulmonary effort is normal.  Skin:    General: Skin is warm and dry.  Neurological:     Mental Status: He is alert and oriented to person, place, and time.     Cranial Nerves: No dysarthria or facial asymmetry.     Motor: Weakness present.     Comments: Able to move all extremities. Right leg weaker than left. Able to slightly raise right leg off the bed. Able to move right and left arms/hands but he states his right arm feels weaker.     Imaging: CT HEAD CODE STROKE WO CONTRAST  Result Date: 05/10/2022 CLINICAL DATA:  Code stroke. Initial evaluation for neuro deficit, stroke suspected. EXAM: CT HEAD WITHOUT CONTRAST TECHNIQUE: Contiguous axial images were obtained from the base of the skull through the vertex without intravenous contrast. RADIATION DOSE REDUCTION: This exam was performed according to the departmental dose-optimization program which includes automated exposure control, adjustment of the mA and/or kV according to patient size and/or use of iterative reconstruction technique. COMPARISON:  Previous MRI from 03/18/2022. FINDINGS: Brain: Age-related cerebral atrophy. Small remote cortical infarct at the high posterior left parietal lobe. Additional remote left cerebellar infarct. Small remote lacunar infarct at the left basal ganglia. No  acute intracranial hemorrhage. No visible acute large vessel territory infarct. No mass lesion or midline shift. No hydrocephalus or extra-axial fluid collection. Vascular: No hyperdense vessel. Calcified atherosclerosis present at skull base. Skull: Scalp soft tissues within normal limits.  Calvarium intact. Sinuses/Orbits: Left gaze noted. Globes orbital soft tissues demonstrate no other acute finding. Remote posttraumatic defect at  the left lamina papyracea noted. Chronic mucoperiosteal thickening noted about the ethmoidal air cells and visualized right maxillary sinus. Mastoid air cells are clear. Other: None. ASPECTS [facility] Stroke Program Early CT Score) - Ganglionic level infarction (caudate, lentiform nuclei, internal capsule, insula, M1-M3 cortex): 7 - Supraganglionic infarction (M4-M6 cortex): 3 Total score (0-10 with 10 being normal): 10 IMPRESSION: 1. No acute intracranial abnormality 2. Ten.  ASPECTS is 3. 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 These results were communicated to Dr. Otelia Limes at 8:13 pm on 05/10/2022 by text page via the [facility] messaging system. Electronically Signed   By: Rise Mu M.D.   On: 05/10/2022 20:20   CT ANGIO HEAD NECK W WO CM W PERF (CODE STROKE)  Result Date: 05/10/2022 CLINICAL DATA:  Initial evaluation for acute stroke. EXAM: CT ANGIOGRAPHY HEAD AND NECK CT PERFUSION BRAIN TECHNIQUE: Multidetector CT imaging of the head and neck was performed using the standard protocol during bolus administration of intravenous contrast. Multiplanar CT image reconstructions and MIPs were obtained to evaluate the vascular anatomy. Carotid stenosis measurements (when applicable) are obtained utilizing NASCET criteria, using the distal internal carotid diameter as the denominator. Multiphase CT imaging of the brain was performed following IV bolus contrast injection. Subsequent parametric perfusion maps were calculated using RAPID software. RADIATION DOSE REDUCTION: This exam was performed according to the departmental dose-optimization program which includes automated exposure control, adjustment of the mA and/or kV according to patient size and/or use of iterative reconstruction technique. CONTRAST:  OMNIPAQUE IOHEXOL 350 MG/ML SOLN COMPARISON:  Prior head CT from earlier the same day. FINDINGS: CTA NECK FINDINGS Aortic arch:  Visualized aortic arch normal in caliber with standard 3 vessel morphology. Mild plaque about the arch and origin the great vessels without significant stenosis. Right carotid system: Right CCA widely patent. Calcified plaque about the right carotid bulb/proximal right ICA with associated stenosis of up to 55% by NASCET criteria. Right ICA patent distally without stenosis or dissection. Left carotid system: Left CCA patent from its origin to the bifurcation. There is acute occlusion of the proximal cervical left ICA just distal to the bifurcation (series 7, image 216) left ICA remains occluded to the skull base. Vertebral arteries: Both vertebral arteries arise from subclavian arteries. No proximal subclavian artery stenosis. Vertebral arteries patent without stenosis or dissection. Skeleton: No discrete or worrisome osseous lesions. Other neck: No other acute soft tissue abnormality within the neck. Upper chest: Visualized upper chest demonstrates no acute finding. Review of the MIP images confirms the above findings CTA HEAD FINDINGS Anterior circulation: Scattered atheromatous change within the right carotid siphon with associated moderate to severe multifocal stenosis. Left ICA occluded at the skull base. Distal reconstitution at the supraclinoid segment. Probable small amount of thrombus/clot seen protruding into the lumen at the supraclinoid left ICA (series 8, image 135). A1 segments patent. Normal anterior communicating complex. Anterior cerebral arteries patent to their distal aspects without stenosis. M1 segments remain patent without stenosis or occlusion. No visible proximal MCA branch occlusion. Distal MCA branches remain patent. No visible significant downstream left MCA branch occlusion. Posterior circulation:  Extensive atheromatous disease seen involving the V4 segments bilaterally with associated mild to moderate multifocal narrowing on the left with moderate to severe narrowing on the right. Left  vertebral artery slightly dominant. Atheromatous disease extends into the basilar artery with associated moderate to severe multifocal stenoses involving the proximal and mid basilar artery. Basilar remains patent to its distal aspect. Superior cerebral arteries patent bilaterally. Both PCAs primarily supplied basilar and remain patent to their distal aspects. Venous sinuses: Grossly patent allowing for timing the contrast bolus. Anatomic variants: None significant.  No aneurysm. Review of the MIP images confirms the above findings CT Brain Perfusion Findings: ASPECTS: 10. CBF (<30%) Volume: 38mL Perfusion (Tmax>6.0s) volume: Mismatch Volume: Infarction Location:Small acute core infarct present at the anterior left frontal lobe. Extremely large surrounding ischemic penumbra involving the majority of the left MCA distribution. IMPRESSION: 1. Positive CTA for emergent large vessel occlusion, with abrupt occlusion of the proximal cervical left ICA just distal to the bifurcation. Distal reconstitution at the carotid siphon with probable small amount of thrombus/clot protruding into the lumen at the supraclinoid left ICA. No other visible downstream embolic left MCA occlusion. 2. 4 cc acute core infarct at the anterior left frontal lobe, with large surrounding ischemic penumbra, mismatch volume measures 236 mL. 3. Atheromatous change about the right carotid bulb/proximal right ICA with associated stenosis of up to 55% by NASCET criteria. 4. Extensive atheromatous change throughout the intracranial circulation, with associated moderate to severe multifocal stenoses involving the right carotid siphon, V4 segments, and basilar artery as above. Critical Value/emergent results were called by telephone at the time of interpretation on 05/10/2022 at 8:26 pm to provider ERIC [facility] , who verbally acknowledged these results. Electronically Signed   By: Rise Mu M.D.   On: 05/10/2022 20:44     Labs:  CBC: Recent Labs    03/21/22 0440 05/01/22 1248 05/10/22 2006 05/10/22 2328 05/11/22 0249  WBC 7.2 6.1 7.1  --  9.7  HGB 10.9* 11.4* 11.9* 12.6* 12.8*  HCT 33.3* 36.5* 36.3* 37.0* 37.6*  PLT 371 376 385  --  394    COAGS: Recent Labs    03/08/22 1949 05/10/22 2006  INR 1.2 1.1  APTT 35 34    BMP: Recent Labs    03/21/22 0440 05/01/22 1248 05/10/22 2006 05/10/22 2328 05/11/22 0249  NA 132* 131* 134* 135 131*  K 3.6 3.6 4.3 3.8 3.8  CL 100 103 104  --  101  CO2 25 20* 20*  --  20*  GLUCOSE 142* 81 96  --  171*  BUN 10 10 10   --  7  CALCIUM 8.4* 8.8* 8.5*  --  8.8*  CREATININE 0.89 0.98 0.98  --  1.04  GFRNONAA >60 >60 >60  --  >60    LIVER FUNCTION TESTS: Recent Labs    03/08/22 1949 05/01/22 1248 05/10/22 2006 05/11/22 0249  BILITOT 0.4 0.4 0.6 0.2*  AST 16 11* 14* 12*  ALT 10 7 7 10   ALKPHOS 69 60 64 74  PROT 8.4* 8.0 8.5* 8.1  ALBUMIN 3.4* 3.2* 3.2* 3.3*    Assessment and Plan:  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 . Complete recanalization achieved.    Patient is awake, alert and oriented. Able to move all extremities but noticeable right lower extremity weakness. Patient able to recall the events of yesterday. He is afebrile and without leukocytosis or anemia.   Blood pressure remains elevated and he  is still on a cleviprex infusion. MR brain pending.   Right groin vascular site is clean, soft and dry. Ok to remove dressing tomorrow. Other plans per primary teams.   Please call [facility] with any questions.   Electronically Signed: Alwyn Ren, AGACNP-BC [phone] 05/11/2022, 9:23 AM   I spent a total of 15 Minutes at the the patient's bedside AND on the patient's hospital floor or unit, greater than 50% of which was counseling/coordinating care for left ICA occlusion s/p mechanical thrombectomy.

## 2022-05-11 NOTE — Progress Notes (Addendum)
STROKE TEAM PROGRESS NOTE   INTERVAL HISTORY Patient presented with aphasia and right hemiparesis due to proximal left carotid occlusion and underwent thrombolysis with IV TNK followed by successful mechanical thrombectomy with  TICI 3 revascularization.  He was extubated yesterday and is doing well.  He is awake interactive and does not seem to have significant aphasia but has only mild right-sided weakness.  Vital signs are stable.  Blood pressure adequately controlled.  He has passed swallow eval and plan to resume norvasc, metoprolol, atorvastatin.  Vitals:   05/11/22 0724 05/11/22 0730 05/11/22 0745 05/11/22 0800  BP:  (!) 146/82 137/77 139/71  Pulse: 82 75 77 79  Resp: Temp: 98.1 F (36.7 C)     TempSrc: Oral     SpO2: 98% 96% 95% 96%   CBC:  Recent Labs  Lab 05/10/22 2006 05/10/22 2328 05/11/22 0249  WBC 7.1  --  9.7  NEUTROABS 3.6  --   --   HGB 11.9* 12.6* 12.8*  HCT 36.3* 37.0* 37.6*  MCV 93.1  --  91.0  PLT 385  --  394   Basic Metabolic Panel:  Recent Labs  Lab 05/10/22 2006 05/10/22 2328 05/11/22 0249  NA 134* 135 131*  K 4.3 3.8 3.8  CL 104  --  101  CO2 20*  --  20*  GLUCOSE 96  --  171*  BUN 10  --  7  CREATININE 0.98  --  1.04  CALCIUM 8.5*  --  8.8*  MG  --   --  1.7  PHOS  --   --  2.5   Lipid Panel:  Recent Labs  Lab 05/11/22 0249  CHOL 140  TRIG 87  HDL 32*  CHOLHDL 4.4  VLDL 17  LDLCALC 91   HgbA1c: No results for input(s): HGBA1C in the last 168 hours. Urine Drug Screen:  Recent Labs  Lab 05/11/22 0405  LABOPIA NONE DETECTED  COCAINSCRNUR NONE DETECTED  LABBENZ NONE DETECTED  AMPHETMU NONE DETECTED  THCU POSITIVE*  LABBARB NONE DETECTED    Alcohol Level  Recent Labs  Lab 05/11/22 0249  ETH <10    IMAGING past 24 hours CT HEAD CODE STROKE WO CONTRAST  Result Date: 05/10/2022 CLINICAL DATA:  Code stroke. Initial evaluation for neuro deficit, stroke suspected. EXAM: CT HEAD WITHOUT CONTRAST TECHNIQUE:  Contiguous axial images were obtained from the base of the skull through the vertex without intravenous contrast. RADIATION DOSE REDUCTION: This exam was performed according to the departmental dose-optimization program which includes automated exposure control, adjustment of the mA and/or kV according to patient size and/or use of iterative reconstruction technique. COMPARISON:  Previous MRI from 03/18/2022. FINDINGS: Brain: Age-related cerebral atrophy. Small remote cortical infarct at the high posterior left parietal lobe. Additional remote left cerebellar infarct. Small remote lacunar infarct at the left basal ganglia. No acute intracranial hemorrhage. No visible acute large vessel territory infarct. No mass lesion or midline shift. No hydrocephalus or extra-axial fluid collection. Vascular: No hyperdense vessel. Calcified atherosclerosis present at skull base. Skull: Scalp soft tissues within normal limits.  Calvarium intact. Sinuses/Orbits: Left gaze noted. Globes orbital soft tissues demonstrate no other acute finding. Remote posttraumatic defect at the left lamina papyracea noted. Chronic mucoperiosteal thickening noted about the ethmoidal air cells and visualized right maxillary sinus. Mastoid air cells are clear. Other: None. ASPECTS [facility] Stroke Program Early CT Score) - Ganglionic level infarction (caudate, lentiform nuclei, internal capsule, insula, M1-M3 cortex): 7 -  Supraganglionic infarction (M4-M6 cortex): 3 Total score (0-10 with 10 being normal): 10 IMPRESSION: 1. No acute intracranial abnormality 2. Ten.  ASPECTS is 3. 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 These results were communicated to Dr. Otelia Limes at 8:13 pm on 05/10/2022 by text page via the [facility] messaging system. Electronically Signed   By: Rise Mu M.D.   On: 05/10/2022 20:20   CT ANGIO HEAD NECK W WO CM W PERF (CODE STROKE)  Result Date:  05/10/2022 CLINICAL DATA:  Initial evaluation for acute stroke. EXAM: CT ANGIOGRAPHY HEAD AND NECK CT PERFUSION BRAIN TECHNIQUE: Multidetector CT imaging of the head and neck was performed using the standard protocol during bolus administration of intravenous contrast. Multiplanar CT image reconstructions and MIPs were obtained to evaluate the vascular anatomy. Carotid stenosis measurements (when applicable) are obtained utilizing NASCET criteria, using the distal internal carotid diameter as the denominator. Multiphase CT imaging of the brain was performed following IV bolus contrast injection. Subsequent parametric perfusion maps were calculated using RAPID software. RADIATION DOSE REDUCTION: This exam was performed according to the departmental dose-optimization program which includes automated exposure control, adjustment of the mA and/or kV according to patient size and/or use of iterative reconstruction technique. CONTRAST:  OMNIPAQUE IOHEXOL 350 MG/ML SOLN COMPARISON:  Prior head CT from earlier the same day. FINDINGS: CTA NECK FINDINGS Aortic arch: Visualized aortic arch normal in caliber with standard 3 vessel morphology. Mild plaque about the arch and origin the great vessels without significant stenosis. Right carotid system: Right CCA widely patent. Calcified plaque about the right carotid bulb/proximal right ICA with associated stenosis of up to 55% by NASCET criteria. Right ICA patent distally without stenosis or dissection. Left carotid system: Left CCA patent from its origin to the bifurcation. There is acute occlusion of the proximal cervical left ICA just distal to the bifurcation (series 7, image 216) left ICA remains occluded to the skull base. Vertebral arteries: Both vertebral arteries arise from subclavian arteries. No proximal subclavian artery stenosis. Vertebral arteries patent without stenosis or dissection. Skeleton: No discrete or worrisome osseous lesions. Other neck: No other  acute soft tissue abnormality within the neck. Upper chest: Visualized upper chest demonstrates no acute finding. Review of the MIP images confirms the above findings CTA HEAD FINDINGS Anterior circulation: Scattered atheromatous change within the right carotid siphon with associated moderate to severe multifocal stenosis. Left ICA occluded at the skull base. Distal reconstitution at the supraclinoid segment. Probable small amount of thrombus/clot seen protruding into the lumen at the supraclinoid left ICA (series 8, image 135). A1 segments patent. Normal anterior communicating complex. Anterior cerebral arteries patent to their distal aspects without stenosis. M1 segments remain patent without stenosis or occlusion. No visible proximal MCA branch occlusion. Distal MCA branches remain patent. No visible significant downstream left MCA branch occlusion. Posterior circulation: Extensive atheromatous disease seen involving the V4 segments bilaterally with associated mild to moderate multifocal narrowing on the left with moderate to severe narrowing on the right. Left vertebral artery slightly dominant. Atheromatous disease extends into the basilar artery with associated moderate to severe multifocal stenoses involving the proximal and mid basilar artery. Basilar remains patent to its distal aspect. Superior cerebral arteries patent bilaterally. Both PCAs primarily supplied basilar and remain patent to their distal aspects. Venous sinuses: Grossly patent allowing for timing the contrast bolus. Anatomic variants: None significant.  No aneurysm. Review of the MIP images confirms the above findings CT Brain Perfusion  Findings: ASPECTS: 10. CBF (<30%) Volume: 4mL Perfusion (Tmax>6.0s) volume: 240mL Mismatch Volume: 236mL Infarction Location:Small acute core infarct present at the anterior left frontal lobe. Extremely large surrounding ischemic penumbra involving the majority of the left MCA distribution. IMPRESSION: 1.  Positive CTA for emergent large vessel occlusion, with abrupt occlusion of the proximal cervical left ICA just distal to the bifurcation. Distal reconstitution at the carotid siphon with probable small amount of thrombus/clot protruding into the lumen at the supraclinoid left ICA. No other visible downstream embolic left MCA occlusion. 2. 4 cc acute core infarct at the anterior left frontal lobe, with large surrounding ischemic penumbra, mismatch volume measures 236 mL. 3. Atheromatous change about the right carotid bulb/proximal right ICA with associated stenosis of up to 55% by NASCET criteria. 4. Extensive atheromatous change throughout the intracranial circulation, with associated moderate to severe multifocal stenoses involving the right carotid siphon, V4 segments, and basilar artery as above. Critical Value/emergent results were called by telephone at the time of interpretation on 05/10/2022 at 8:26 pm to provider [facility] , who verbally acknowledged these results. Electronically Signed   By: Rise MuBenjamin  McClintock M.D.   On: 05/10/2022 20:44    PHYSICAL EXAM  Physical Exam  Constitutional: Appears well-developed and well-nourished middle-aged African-American male.  Cardiovascular: Normal rate and regular rhythm.  Respiratory: Effort normal, non-labored breathing  Neuro: Mental Status: Patient is awake, alert, oriented to person, place, month, year, and situation. Patient is able to give a clear and coherent history. Forgetful, limited memory with situational questions No signs of aphasia or neglect.  Speech is fluent with only slight word hesitancy but no paraphasic errors.  Able to name repeat and comprehend well. Cranial Nerves: II: Visual Fields are full. Pupils are equal, round, and reactive to light.   III,IV, VI: EOMI without ptosis or diploplia.  V: Facial sensation is symmetric to temperature VII: Facial movement is symmetric resting and smiling VIII: Hearing is intact to  voice X: Palate elevates symmetrically XI: Shoulder shrug is symmetric. XII: Tongue protrudes midline without atrophy or fasciculations.  Motor: Tone is normal. Bulk is normal. 5/5 strength was present in all four extremities.  RUE 4/5 mild right grip weakness.  Diminished fine finger movements on the right.  LUE 5/5 RLE 3/5 marked right lower extremity drift. LLE 5/5 Hand weakness on the right  Sensory: Sensation is symmetric to light touch and temperature in the arms and legs. No extinction to DSS present.  Cerebellar: FNF and HKS are intact bilaterally    ASSESSMENT/PLAN James Lambert is a [age] y.o. male with history of asthma, copd, hidradenitis suppurativa, and HTN presenting with confusion and aphasia.  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 Taken for thrombectomy achieving TICI3 revasc. He does have some residual right side weakness. Consider loop recorder prior to discharge if he does not show afib inpatient. Recently had [facility] stroke [facility], he had not yet followed up with cardiology or neurology outpatient. Candidate for sleep smart study.   Stroke:  Scattered small acute infarcts in the left cerebral hemisphere and right posterior lentiform nucleus secondary to left ICA occlusion s/p IV TNK and successful thrombectomy with TICI3 revascularization Code Stroke CT head No acute abnormality.  CTA head & neck Abrupt occlusion of the proximal cervical left ICA just distal to the bifurcation CT perfusion 4 cc acute core infarct at the anterior left frontal lobe, with large surrounding ischemic penumbra,  mismatch volume measures 236 mL Post IR CT No evidence of thromboembolic or hemorrhagic complication. MRI scattered small acute infarcts in the left cerebral hemisphere, with an additional punctate acute infarct in the right posterior lentiform nucleus.  2D Echo pending LDL 91 HgbA1c 5.3 VTE prophylaxis - SCDs     Diet   Diet Heart Room service appropriate? Yes with Assist; Fluid consistency: Thin   aspirin 81 mg daily prior to admission, now on aspirin 81 mg daily.  Therapy recommendations:  pending Disposition:  pending  Mechanical thrombectomy of Left ICA with TICI3 revascularization MRI shows scattered infarcts in left hemisphere BP goal 120-140 for 24 hours post procedure  Hypertension Home meds:  none Stable- on cleviprex BP goal 120-140 Long-term BP goal normotensive Start norvasc 10mg   Hyperlipidemia Home meds:  atorvastatin 80mg , resumed in hospital LDL 91, goal < 70 Continue statin at discharge   Other Stroke Risk Factors Cigarette smoker, advised to stop smoking ETOH use, alcohol level <10, advised to drink no more than 2 drink(s) a day On CIWA protocol with ativan Thiamine, folic acid, multi vit ordered Substance abuse - UDS:  THC POSITIVE, Cocaine NONE DETECTED. Patient advised to stop using due to stroke risk. Obesity, There is no height or weight on file to calculate BMI., BMI >/= 30 associated with increased stroke risk, recommend weight loss, diet and exercise as appropriate  Hx stroke/TIA 03/07/2022- full stroke work up done at [facility]- had numbness on the right side  MRI showed multifocal ischemic areas in an embolic distribution  Other Active Problems   Hospital day # 1  Patient seen and examined by NP/APP with MD. MD to update note as needed.   03/09/2022, DNP, FNP-BC [facility] Pager: [phone]   STROKE MD NOTE : I have personally obtained history,examined this patient, reviewed notes, independently viewed imaging studies, participated in medical decision making and plan of care.ROS completed by me personally and pertinent positives fully documented  I have made any additions or clarifications directly to the above note. Agree with note above.  Patient presented with aphasia Vitamed paresis due to left carotid occlusion and underwent  thrombolysis with TNK followed by successful mechanical thrombectomy.  He is doing well without significant aphasia but has only mild right-sided weakness.  Continue close neurological monitoring and strict blood pressure control as per post thrombolytic and lobectomy protocol.  Mobilize out of bed.  Therapy consults.  Check echocardiogram.  Start aspirin.  Aggressive risk factor modification.  Patient counseled not to use marijuana and drink alcohol.  Alcohol withdrawal precautions.  Discussed with Dr. Elmer Picker critical care medicine. This patient is critically ill and at significant risk of neurological worsening, death and care requires constant monitoring of vital signs, hemodynamics,respiratory and cardiac monitoring, extensive review of multiple databases, frequent neurological assessment, discussion with family, other specialists and medical decision making of high complexity.I have made any additions or clarifications directly to the above note.This critical care time does not reflect procedure time, or teaching time or supervisory time of PA/NP/Med Resident etc but could involve care discussion time.  I spent 35 minutes of neurocritical care time  in the care of  this patient.      [phone], MD Medical Director [facility] Stroke [facility] Pager: [phone] 05/11/2022 3:59 PM   To contact Stroke Continuity provider, please refer to [phone]. After hours, contact General Neurology

## 2022-05-11 NOTE — Anesthesia Postprocedure Evaluation (Signed)
Anesthesia Post Note  Patient: James Lambert  Procedure(s) Performed: IR WITH ANESTHESIA     Patient location during evaluation: SICU Anesthesia Type: General Level of consciousness: awake Pain management: pain level controlled Vital Signs Assessment: post-procedure vital signs reviewed and stable Respiratory status: spontaneous breathing, nonlabored ventilation and respiratory function stable Cardiovascular status: blood pressure returned to baseline and stable Postop Assessment: no apparent nausea or vomiting Anesthetic complications: no   No notable events documented.  Last Vitals:  Vitals:   05/11/22 0030 05/11/22 0045  BP: (!) 153/83 (!) 145/82  Pulse: 80 75  Resp: 15 (!) 22  Temp:    SpO2: 95% 94%    Last Pain:  Vitals:   05/11/22 0000  TempSrc: Oral                 Hien Cunliffe,W. EDMOND

## 2022-05-11 NOTE — Progress Notes (Signed)
OT Cancellation Note  Patient Details Name: James Lambert MRN: [id number] DOB: May 13, 1966   Cancelled Treatment:    Reason Eval/Treat Not Completed: Active bedrest order. Pt's bed rest for post sheath removal has expired; however his initial strict bedrest order has not.  Ignacia Palma, OTR/L [facility] [phone] Office [phone]    Evette Georges 05/11/2022, 6:42 AM

## 2022-05-11 NOTE — Progress Notes (Signed)
  Echocardiogram 2D Echocardiogram has been performed.  James Lambert 05/11/2022, 3:53 PM

## 2022-05-11 NOTE — Progress Notes (Signed)
SLP Cancellation Note  Patient Details Name: James Lambert MRN: [id number] DOB: February 26, 1966   Cancelled treatment:       Reason Eval/Treat Not Completed: Patient at procedure or test/unavailable. SLP will f/u next date   Angela Nevin, MA, CCC-SLP Speech Therapy

## 2022-05-11 NOTE — Evaluation (Signed)
Occupational Therapy Evaluation Patient Details Name: James Lambert MRN: [id number] DOB: 05-09-1966 Today's Date: 05/11/2022   History of Present Illness 56 y.o. male [facility] to [facility] 05/10/2022 with AMS and aphasia. Of note pt with 2 admissions in April 2023 with CVAs. CTA demonstrates L ICA occlusion. Pt received TNK and underwent thrombectomy on 6/3. PMH includes asthma, COPD, HTN, CVA.   Clinical Impression   This 56 yo male admitted with above presents to acute OT with per his report independent to Mod I with all basic ADLs, IADLs but not driving. He currently is min-total A for all basic ADLs, is inattentive to right side, has visual deficits and decreased safety awareness. He will continue to benefit from acute OT with follow up on AIR.      Recommendations for follow up therapy are one component of a multi-disciplinary discharge planning process, led by the attending physician.  Recommendations may be updated based on patient status, additional functional criteria and insurance authorization.   Follow Up Recommendations  Acute inpatient rehab (3hours/day)    Assistance Recommended at Discharge Frequent or constant Supervision/Assistance  Patient can return home with the following A lot of help with walking and/or transfers;A lot of help with bathing/dressing/bathroom;Assistance with cooking/housework;Assistance with feeding;Assist for transportation;Direct supervision/assist for financial management;Direct supervision/assist for medications management    Functional Status Assessment  Patient has had a recent decline in their functional status and demonstrates the ability to make significant improvements in function in a reasonable and predictable amount of time.  Equipment Recommendations  Other (comment) (TBD next venue)    Recommendations for Other Services Rehab consult     Precautions / Restrictions Precautions Precautions: Fall Restrictions Weight Bearing  Restrictions: No      Mobility Bed Mobility Overal bed mobility: Needs Assistance Bed Mobility: Supine to Sit     Supine to sit: Min guard, HOB elevated          Transfers Overall transfer level: Needs assistance Equipment used: 1 person hand held assist, None Transfers: Sit to/from Stand, Bed to chair/wheelchair/BSC Sit to Stand: Min assist, +2 physical assistance     Step pivot transfers: Min guard     General transfer comment: pt with difficulty extending trunk, performing step pivot in flexed position at trunk, utilizing UE support of bed rail and armrest for support. Quick descent into sitting with stand to sit          ADL either performed or assessed with clinical judgement   ADL Overall ADL's : Needs assistance/impaired Eating/Feeding: Sitting;Minimal assistance   Grooming: Sitting;Minimal assistance   Upper Body Bathing: Sitting;Minimal assistance   Lower Body Bathing: Moderate assistance Lower Body Bathing Details (indicate cue type and reason): min A +2 sit<>stand Upper Body Dressing : Moderate assistance;Sitting   Lower Body Dressing: Total assistance Lower Body Dressing Details (indicate cue type and reason): min A +2 sit<>stand Toilet Transfer: Min guard;Stand-pivot;+2 for safety/equipment Toilet Transfer Details (indicate cue type and reason): Pt comes up to stand and stays flexed at hips ~90 degrees to turn from bed to recliner Toileting- Clothing Manipulation and Hygiene: Total assistance Toileting - Clothing Manipulation Details (indicate cue type and reason): min A +2 sit<>stand             Vision Baseline Vision/History: 1 Wears glasses Ability to See in Adequate Light: 0 Adequate Vision Assessment?: Vision impaired- to be further tested in functional context Additional Comments: Reports bad vision since prior CVAs, definiate right inattention  Pertinent Vitals/Pain Pain Assessment Pain Assessment: Faces Faces Pain  Scale: Hurts whole lot Pain Location: L foot but pt reports of pain location fluctuate throughout session Pain Descriptors / Indicators: Aching, Sore Pain Intervention(s): Limited activity within patient's tolerance, Monitored during session, Repositioned     Hand Dominance Right   Extremity/Trunk Assessment Upper Extremity Assessment Upper Extremity Assessment: RUE deficits/detail RUE Deficits / Details: reports shoulder is sore, AROM WFL   Lower Extremity Assessment Lower Extremity Assessment: LLE deficits/detail;RLE deficits/detail RLE Deficits / Details: pt with 4/5 strength grossly, ROM WFL LLE Deficits / Details: pt with functional ROM and strength, pt reports pain intermittently in L foot, then left lower leg, later no pain   Cervical / Trunk Assessment Cervical / Trunk Assessment: Normal   Communication Communication Communication: Receptive difficulties;Expressive difficulties   Cognition Arousal/Alertness: Awake/alert Behavior During Therapy: Impulsive Overall Cognitive Status: Difficult to assess Area of Impairment: Orientation, Following commands, Safety/judgement, Awareness, Problem solving                 Orientation Level: Disoriented to, Time (initially reported 2003)     Following Commands: Follows one step commands inconsistently, Follows one step commands with increased time Safety/Judgement: Decreased awareness of safety, Decreased awareness of deficits Awareness: Intellectual Problem Solving: Slow processing, Difficulty sequencing, Requires verbal cues, Requires tactile cues       General Comments  VSS on RA, pt appears to have R inattention and possible vision deficits which will require further investigation. Pt reports vision deficits in left eye from previous CVA, however pt with more difficulty seeing objects on R side during session            Home Living Family/patient expects to be discharged to:: Private residence Living  Arrangements: Other relatives (brother) Available Help at Discharge: Family;Friend(s);Available PRN/intermittently Type of Home: Apartment (pt reports apartment, chart denotes group home) Home Access: Stairs to enter Entrance Stairs-Number of Steps: 5 Entrance Stairs-Rails: Can reach both Home Layout: One level     Bathroom Shower/Tub: Tub/shower unit;Curtain   Bathroom Toilet: Standard     Home Equipment: BSC/3in1;Shower seat;Grab bars - tub/shower;Cane - single point;Rollator (4 wheels);Other (comment)   Additional Comments: pt reports living in an apartment, notes earlier in admission reports the pt came from a group home      Prior Functioning/Environment Prior Level of Function : Needs assist             Mobility Comments: pt reports he was ambulating with use of a cane. Pt does not drive, friend takes him when transportation is needed ADLs Comments: pt reports independence with ADLs, utilizes shower seat        OT Problem List: Decreased strength;Impaired balance (sitting and/or standing);Impaired vision/perception;Decreased coordination;Decreased cognition;Impaired UE functional use      OT Treatment/Interventions: Self-care/ADL training;DME and/or AE instruction;Patient/family education;Balance training;Visual/perceptual remediation/compensation;Therapeutic exercise;Therapeutic activities    OT Goals(Current goals can be found in the care plan section) Acute Rehab OT Goals Patient Stated Goal: to go home OT Goal Formulation: With patient Time For Goal Achievement: 05/25/22 Potential to Achieve Goals: Good  OT Frequency: Min 2X/week       AM-PAC OT "6 Clicks" Daily Activity     Outcome Measure Help from another person eating meals?: A Little Help from another person taking care of personal grooming?: A Little Help from another person toileting, which includes using toliet, bedpan, or urinal?: A Lot Help from another person bathing (including washing, rinsing,  drying)?: A Lot Help from another  person to put on and taking off regular upper body clothing?: A Lot Help from another person to put on and taking off regular lower body clothing?: Total 6 Click Score: 13   End of Session Equipment Utilized During Treatment: Gait belt Nurse Communication: Mobility status  Activity Tolerance: Patient tolerated treatment well Patient left: in chair;with call bell/phone within reach;with chair alarm set  OT Visit Diagnosis: Unsteadiness on feet (R26.81);Other abnormalities of gait and mobility (R26.89);Muscle weakness (generalized) (M62.81);Other symptoms and signs involving cognitive function;Hemiplegia and hemiparesis;Low vision, both eyes (H54.2) Hemiplegia - Right/Left: Right Hemiplegia - dominant/non-dominant: Dominant Hemiplegia - caused by: Cerebral infarction                Time: 0109-3235 OT Time Calculation (min): 28 min Charges:  OT General Charges $OT Visit: 1 Visit OT Evaluation $OT Eval Moderate Complexity: 1 Mod  Ignacia Palma, OTR/L Acute Rehab Services [facility] [phone] Office [phone]    Evette Georges 05/11/2022, 11:37 AM

## 2022-05-11 NOTE — Progress Notes (Signed)
Inpatient Rehab Admissions Coordinator Note:   Per therapy patient was screened for CIR candidacy by Lazar Tierce Danford Bad, CCC-SLP. At this time, pt appears to be a potential candidate for CIR. I will place an order for rehab consult for full assessment, per our protocol.  Please contact me any with questions.Gayland Curry, [place], [place] Admissions Coordinator [phone] 05/11/22 4:52 PM

## 2022-05-11 NOTE — Consult Note (Signed)
NAME:  James Lambert, MRN:  [id number], DOB:  1966/10/21, LOS: 1 ADMISSION DATE:  05/10/2022, CONSULTATION DATE:  05/11/22 REFERRING MD:  Leonie Man -neuro, CHIEF COMPLAINT:  agitation   History of Present Illness:   56 yo M PMH Etoh use disorder, tobacco use, HTN, CVA presented to [facility] ED 6/3 with aphagia. TNK given. Transferred to [facility]. Underwent thrombectomy for L ICA occlusion achieving TICI3 revasc.   6/4 PCCM is consulted for agitation   In interview w pt, he endorses about 2 shots of liquor/d for the apst several weeks (approx 4). Preceding this, he had heavier consumption. He has stopped drinking abruptly before (jail) and has never had severe withdrawals, seizure.    Pertinent  Medical History  HTN Etoh use Tobacco use CVA   Significant Hospital Events: Including procedures, antibiotic start and stop dates in addition to other pertinent events   6/3 tnk at [facility] for CVA. L ICA occlusion. [facility]. Thrombectomy, TICI3 revasc 6/4 pccm consult for agitation   Interim History / Subjective:  Talking on phone with friends  Received 2mg  of ativan   Objective   Blood pressure (!) 141/93, pulse 77, temperature 98.1 F (36.7 C), temperature source Oral, resp. rate 12, SpO2 98 %.        Intake/Output Summary (Last 24 hours) at 05/11/2022 0956 Last data filed at 05/11/2022 0800 Gross per 24 hour  Intake 1681.99 ml  Output 675 ml  Net 1006.99 ml   There were no vitals filed for this visit.  Examination: General: wdwn middle aged M NAD  HENT: NCAT pink mm Lungs: CTAb even unlabored  Cardiovascular: rrr Abdomen: soft ndnt  Extremities: no acute deformity  Neuro: AAOx3 following commands. RLE weakness.  GU: defer Pscyh: calm, cooperative   Resolved Hospital Problem list     Assessment & Plan:   Hx etoh abuse Hx tobacco abuse Intermittent agitation  -no evidence of etoh withdrawals right now.  P -can add CIWA protocol (PRN ativan), micronutrient support. 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 P -per neuro -cleviprex for BP goal  -PT/OT as appropriate  HTN -norvasc, metop -clevi gtt -PRN labetalol   Hyponatremia, mild  -trend BMP  Hyperglycemia -SSI  Best Practice (right click and "Reselect all SmartList Selections" daily)   Per primary   Labs   CBC: Recent Labs  Lab 05/10/22 2006 05/10/22 2328 05/11/22 0249  WBC 7.1  --  9.7  NEUTROABS 3.6  --   --   HGB 11.9* 12.6* 12.8*  HCT 36.3* 37.0* 37.6*  MCV 93.1  --  91.0  PLT 385  --  [id number]    Basic Metabolic Panel: Recent Labs  Lab 05/10/22 2006 05/10/22 2328 05/11/22 0249  NA 134* 135 131*  K 4.3 3.8 3.8  CL 104  --  101  CO2 20*  --  20*  GLUCOSE 96  --  171*  BUN 10  --  7  CREATININE 0.98  --  1.04  CALCIUM 8.5*  --  8.8*  MG  --   --  1.7  PHOS  --   --  2.5   GFR: Estimated Creatinine Clearance: 105.8 mL/min (by C-G formula based on SCr of 1.04 mg/dL). Recent Labs  Lab 05/10/22 2006 05/11/22 0249  WBC 7.1 9.7    Liver Function Tests: Recent Labs  Lab 05/10/22 2006 05/11/22 0249  AST 14* 12*  ALT 7 10  ALKPHOS 64 74  BILITOT 0.6 0.2*  PROT 8.5* 8.1  ALBUMIN 3.2* 3.3*   No results for input(s): LIPASE, AMYLASE in the last 168 hours. No results for input(s): AMMONIA in the last 168 hours.  ABG    Component Value Date/Time   PHART 7.397 05/10/2022 2328   PCO2ART 37.7 05/10/2022 2328   PO2ART 57 (L) 05/10/2022 2328   HCO3 23.2 05/10/2022 2328   TCO2 24 05/10/2022 2328   ACIDBASEDEF 1.0 05/10/2022 2328   O2SAT 89 05/10/2022 2328     Coagulation Profile: Recent Labs  Lab 05/10/22 2006  INR 1.1    Cardiac Enzymes: No results for input(s): CKTOTAL, CKMB, CKMBINDEX, TROPONINI in the last 168 hours.  HbA1C: Hgb A1c MFr Bld  Date/Time Value Ref Range Status  03/19/2022 03:34 AM 5.3 4.8 - 5.6 % Final    Comment:    (NOTE) Pre diabetes:           5.7%-6.4%  Diabetes:              >6.4%  Glycemic control for   <7.0% adults with diabetes   03/09/2022 12:40 AM 5.4 4.8 - 5.6 % Final    Comment:    (NOTE) Pre diabetes:          5.7%-6.4%  Diabetes:              >6.4%  Glycemic control for   <7.0% adults with diabetes     CBG: Recent Labs  Lab 05/10/22 1950 05/10/22 2326  GLUCAP 98 156*    Review of Systems:   Review of Systems  Constitutional: Negative.   HENT: Negative.    Eyes: Negative.   Respiratory: Negative.    Cardiovascular: Negative.   Gastrointestinal: Negative.   Genitourinary: Negative.   Musculoskeletal: Negative.   Skin: Negative.   Neurological:  Positive for weakness.  Endo/Heme/Allergies: Negative.   Psychiatric/Behavioral: Negative.     Past Medical History:  He,  has a past medical history of Asthma, COPD (chronic obstructive pulmonary disease) ([place]), Hidradenitis suppurativa, and Hypertension.   Surgical History:   Past Surgical History:  Procedure Laterality Date   IR FLUORO GUIDED NEEDLE PLC ASPIRATION/INJECTION LOC  03/20/2022   TEE WITHOUT CARDIOVERSION N/A 03/11/2022   Procedure: TRANSESOPHAGEAL ECHOCARDIOGRAM (TEE);  Surgeon: Corey Skains, MD;  Location: [facility] ORS;  Service: Cardiovascular;  Laterality: N/A;     Social History:   reports that he has been smoking cigarettes. He has been smoking an average of .5 packs per day. He has never used smokeless tobacco. He reports current alcohol use. He reports current drug use. Drug: Marijuana.   Family History:  His family history is not on file.   Allergies No Known Allergies   Home Medications  Prior to Admission medications   Medication Sig Start Date End Date Taking? Authorizing Provider  amLODipine (NORVASC) 10 MG tablet Take 1 tablet (10 mg total) by mouth once daily. 03/21/22  Yes Dwyane Dee, MD  amoxicillin-clavulanate (AUGMENTIN) 875-125 MG tablet Take 1 tablet by mouth 2 (two) times daily. Patient taking  differently: Take 1 tablet by mouth 2 (two) times daily. 10 day course. Pt has 3 tablets left. 05/01/22  Yes Letitia Neri L, PA-C  metoprolol succinate (TOPROL-XL) 25 MG 24 hr tablet Take 1 tablet (25 mg total) by mouth once daily. 03/11/22  Yes Enzo Bi, MD  aspirin 81 MG chewable tablet Chew 1 tablet (81 mg total) by mouth once daily. Patient not taking: Reported on  05/11/2022 03/20/22   Dwyane Dee, MD  atorvastatin (LIPITOR) 80 MG tablet Take 1 tablet (80 mg total) by mouth once daily. Patient not taking: Reported on 05/11/2022 03/12/22   Enzo Bi, MD  clopidogrel (PLAVIX) 75 MG tablet Take 1 tablet (75 mg total) by mouth once daily. Patient not taking: Reported on 05/11/2022 03/12/22   Enzo Bi, MD  HYDROcodone-acetaminophen (NORCO/VICODIN) 5-325 MG tablet Take 1 tablet by mouth every 6 (six) hours as needed for moderate pain. Patient not taking: Reported on 05/11/2022 05/01/22 05/01/23  Johnn Hai, PA-C     Critical care time: n/a      Eliseo Gum MSN, AGACNP-BC [place] for pager  05/11/2022, 10:11 AM

## 2022-05-11 NOTE — H&P (Signed)
Neurology H&P  James Lambert MR# [id number] 05/11/2022   CC: Acute onset of confusion and aphasia  History is obtained from: chart - The patient himself is unable to give any history.  HPI: James Lambert is a 56 y.o. male PMHx as reviewed below acute left ICA just distal to the bifurcation s/p tNK at OSH transferred for thrombectomy.  LKW: [id number] tNK given: yes IR Thrombectomy yes Modified Rankin Scale: 1-No significant post stroke disability and can perform usual duties with stroke symptoms NIHSS: 15  ROS: Unable to assess due to aphasia.  Past Medical History:  Diagnosis Date   Asthma    COPD (chronic obstructive pulmonary disease) (HCC)    Hidradenitis suppurativa    diagnosed in [facility] ED based on history and physical exam   Hypertension     No family history on file.  Social History:  reports that he has been smoking cigarettes. He has been smoking an average of .5 packs per day. He has never used smokeless tobacco. He reports current alcohol use. He reports current drug use. Drug: Marijuana.   Prior to Admission medications   Medication Sig Start Date End Date Taking? Authorizing Provider  amLODipine (NORVASC) 10 MG tablet Take 1 tablet (10 mg total) by mouth once daily. 03/21/22   Dwyane Dee, MD  amoxicillin-clavulanate (AUGMENTIN) 875-125 MG tablet Take 1 tablet by mouth 2 (two) times daily. 05/01/22   Johnn Hai, PA-C  aspirin 81 MG chewable tablet Chew 1 tablet (81 mg total) by mouth once daily. 03/20/22   Dwyane Dee, MD  atorvastatin (LIPITOR) 80 MG tablet Take 1 tablet (80 mg total) by mouth once daily. 03/12/22   Enzo Bi, MD  clopidogrel (PLAVIX) 75 MG tablet Take 1 tablet (75 mg total) by mouth once daily. 03/12/22   Enzo Bi, MD  HYDROcodone-acetaminophen (NORCO/VICODIN) 5-325 MG tablet Take 1 tablet by mouth every 6 (six) hours as needed for moderate pain. 05/01/22 05/01/23  Johnn Hai, PA-C  metoprolol succinate (TOPROL-XL) 25 MG 24 hr tablet  Take 1 tablet (25 mg total) by mouth once daily. 03/11/22   Enzo Bi, MD    Exam: Current vital signs: BP 140/73   Pulse 82   Temp 98.3 F (36.8 C) (Oral)   Resp 19   SpO2 96%   Physical Exam  Constitutional: Appears well-developed and well-nourished.  Psych: Affect appropriate to situation Eyes: No scleral injection HENT: No OP obstruction. Head: Normocephalic.  Cardiovascular: Normal rate and regular rhythm.  Respiratory: Effort normal, symmetric excursions bilaterally, no audible wheezing. GI: Soft.  No distension. There is no tenderness.  Skin: WDI  Neuro: Awake with decreased level of alertness. Mute except for uttering "ow" during noxious stimulus. Does not follow any commands.  No blink to threat on the left. PERRL.   No ptosis. Right sided gaze preference; cannot track to the right and also with no spontaneous saccades to the right. Can be overcome with oculocephalic maneuver. No nystagmus.  Decreased reactivity to right sided stimuli. Brisk reactivity to left sided brow ridge pressure.  Right facial droop Unable to formally assess. Does not significantly react to vocal stimuli.  Unable to assess Head preferentially rotated to the left Unable to assess RUE without spontaneous movements. Will move slightly to noxious. Falls to bed immediately after release.  RLE withdraws to noxious, but less briskly than on the left.  LUE and LLE 5/5 Reacts less briskly to noxious applied to RUE and RLE DTRs 2+ and symmetric  throughout Babinski bilaterally  No ataxia disproportionate to weakness Unable to assess  I have reviewed labs in epic and the pertinent results are: CBG 156  I have reviewed the images obtained: CT head: No acute intracranial abnormality. ASPECTS is 10, chronic left parietal, left basal ganglia, and left cerebellar infarcts.  CTA of head and neck: Abrupt occlusion of the proximal cervical left ICA just distal to the bifurcation. Distal reconstitution at the  carotid siphon with probable small amount of thrombus/clot protruding into the lumen at the supraclinoid left ICA. No other visible downstream embolic left MCA occlusion. Atheromatous change about the right carotid bulb/proximal right ICA with associated stenosis of up to 55% by NASCET criteria. Extensive atheromatous change throughout the intracranial circulation, with associated moderate to severe multifocal stenoses involving the right carotid siphon, V4 segments, and basilar artery. CTP: 4 cc acute core infarct at the anterior left frontal lobe, with large surrounding ischemic penumbra, mismatch volume measures 236 mL.   Assessment: James Lambert is a 56 y.o. male PMHx as noted above with acute proximal left ICA occlusion s/p tNK and thrombectomy.   Impression: Acute left ICA occlusion Hypertensive emergency Acute onset aphasia Acute right sided weakness NIHSS 15 S/p tNK S/p thrombectomy HTN EtOH abuse   Plan: Admit to NICU for post thrombectomy management per protocol. Close surveillance of neurological status. Hip straight x 6 hrs Monitor groin puncture site for pulse quality, limb temperature, signs of neurovascular compromise. Blood pressure: MAP >65 SBP 120-160: - Start clevidipine infusion may increase to maximum 57mcg/min as needed to maintain SBP<120 -140. - Labetalol 20mg  every 10 minutes as needed if SBP>140. No antiplatelet medications or anticoagulants for at least 24 hours following TNK and thrombectomy.   Labs: type and cross, CBC, CMP, Mg, phos, troponin, HbA1c, lipid panel. Maintain O2 sats > 94% Normothermia - For temperature >37.5C - acetaminophen 650mg  q4-6 hours PRN. Gentle IV hydration. CT head 6 hours post procedure or per protocol.  MRI brain without contrast when able. Relative euglycemia and treat if hyperglycemia (>200 mg/dL)/hypoglycemia (< 60mg /dL). TTE. Statin if LDL > 70 If the patient received intra-arterial tPA would prefer to hold antiplatelet  therapy for now. Telemetry monitoring for arrhythmia. Recommend Stroke education. Recommend PT/OT/SLP consult. If there is acute neurologic decline STAT CT head.   PPx:     GI - H2, docusate 400mg  qhs.     DVT - SCDs. Precautions: Aspiration/seizure/fall  CIWA protocol with PRN Ativan order due to high likelihood of EtOH withdrawal.    Electronically signed by:  Lynnae Sandhoff, MD Page: [phone] 05/11/2022, 7:11 AM  If 7pm- 7am, please page neurology on call as listed in [place].

## 2022-05-12 ENCOUNTER — Encounter (HOSPITAL_COMMUNITY): Payer: Self-pay | Admitting: Interventional Radiology

## 2022-05-12 ENCOUNTER — Other Ambulatory Visit (HOSPITAL_COMMUNITY): Payer: Self-pay | Admitting: Radiology

## 2022-05-12 ENCOUNTER — Ambulatory Visit: Payer: Self-pay | Admitting: Surgery

## 2022-05-12 DIAGNOSIS — I639 Cerebral infarction, unspecified: Secondary | ICD-10-CM

## 2022-05-12 MED ORDER — ATORVASTATIN CALCIUM 80 MG PO TABS
80.0000 mg | ORAL_TABLET | Freq: Every day | ORAL | Status: DC
  Administered 2022-05-12 – 2022-05-16 (×5): 80 mg via ORAL
  Filled 2022-05-12 (×5): qty 1

## 2022-05-12 NOTE — Evaluation (Signed)
Speech Language Pathology Evaluation Patient Details Name: James Lambert MRN: [id number] DOB: 1966-06-14 Today's Date: 05/12/2022 Time: [id number] SLP Time Calculation (min) (ACUTE ONLY): 15 min  Problem List:  Patient Active Problem List   Diagnosis Date Noted   Seizure cerebral ([place]) 05/10/2022   Lateral meniscus tear 03/21/2022   Effusion of left knee joint 03/20/2022   Hypertension    Nicotine dependence    Hidradenitis suppurativa    COPD (chronic obstructive pulmonary disease) (HCC)    Acute CVA (cerebrovascular accident) ([place]) 03/08/2022   Past Medical History:  Past Medical History:  Diagnosis Date   Asthma    COPD (chronic obstructive pulmonary disease) (HCC)    Hidradenitis suppurativa    diagnosed in [facility] ED based on history and physical exam   Hypertension    Past Surgical History:  Past Surgical History:  Procedure Laterality Date   IR CT HEAD LTD  05/10/2022   IR FLUORO GUIDED NEEDLE PLC ASPIRATION/INJECTION LOC  03/20/2022   IR PERCUTANEOUS ART THROMBECTOMY/INFUSION INTRACRANIAL INC DIAG ANGIO  05/10/2022   IR US GUIDE VASC ACCESS RIGHT  05/10/2022   RADIOLOGY WITH ANESTHESIA N/A 05/10/2022   Procedure: IR WITH ANESTHESIA;  Surgeon: Luanne Bras, MD;  Location: [place];  Service: Radiology;  Laterality: N/A;   TEE WITHOUT CARDIOVERSION N/A 03/11/2022   Procedure: TRANSESOPHAGEAL ECHOCARDIOGRAM (TEE);  Surgeon: Corey Skains, MD;  Location: [facility];  Service: Cardiovascular;  Laterality: N/A;   HPI:  56 y.o. male presents to [facility] hospital 05/10/2022 with AMS and aphasia. Of note pt with 2 admissions in April 2023 with CVAs. CTA demonstrates L ICA occlusion. Pt received TNK and underwent thrombectomy on 6/3. PMH includes asthma, COPD, HTN, CVA   Assessment / Plan / Recommendation Clinical Impression  Pt's language has improved and reported significantly impaired on arrival to hospital. There were no instances of anomia/dysnomia, decreased comprehension during  evaluation and able to make needs known. Speech was 100% intelligible throughout. Moderate cognitive impairments for memory (storage), mental math, problem solving during clock drawing scoring a 13 out of 15 on the SLUMS cognitive evaluation. He lives with his brother, used to be a Psychologist, sport and exercise (currently unemployed) and completed high school. Recommend continued ST for above deficits on acute and AIR.    SLP Assessment  SLP Recommendation/Assessment: Patient needs continued Speech [place] [facility] SLP Visit Diagnosis: Cognitive communication deficit (R41.841)    Recommendations for follow up therapy are one component of a multi-disciplinary discharge planning process, led by the attending physician.  Recommendations may be updated based on patient status, additional functional criteria and insurance authorization.    Follow Up Recommendations  Acute inpatient rehab (3hours/day)    Assistance Recommended at Discharge  Frequent or constant Supervision/Assistance  Functional Status Assessment Patient has had a recent decline in their functional status and demonstrates the ability to make significant improvements in function in a reasonable and predictable amount of time.  Frequency and Duration min 2x/week  2 weeks      SLP Evaluation Cognition  Overall Cognitive Status: Impaired/Different from baseline (questionable) Arousal/Alertness: Awake/alert Orientation Level: Oriented to person;Oriented to place;Oriented to time;Oriented to situation Year: 2023 Day of Week: Incorrect (off by 1 day) Attention: Sustained Sustained Attention: Appears intact Memory: Impaired Memory Impairment: Retrieval deficit Awareness: Appears intact (need to assess further) Problem Solving: Impaired Problem Solving Impairment: Functional basic Safety/Judgment: Impaired       Comprehension  Auditory Comprehension Overall Auditory Comprehension: Appears within functional limits for tasks  assessed  Visual Recognition/Discrimination Discrimination: Not tested Reading Comprehension Reading Status: Not tested    Expression Expression Primary Mode of Expression: Verbal Verbal Expression Overall Verbal Expression: Appears within functional limits for tasks assessed Initiation: No impairment Repetition:  (NT) Naming:  (divergent named 14 animals with goal of 15+) Pragmatics: No impairment Written Expression Dominant Hand: Right Written Expression: Not tested   Oral / Motor  Oral Motor/Sensory Function Overall Oral Motor/Sensory Function: Within functional limits Motor Speech Overall Motor Speech: Appears within functional limits for tasks assessed Respiration: Within functional limits Phonation: Normal Resonance: Within functional limits Articulation: Within functional limitis Intelligibility: Intelligible Motor Planning: Witnin functional limits            Houston Siren 05/12/2022, 2:36 PM

## 2022-05-12 NOTE — Progress Notes (Signed)
Physical Therapy Treatment Patient Details Name: James Lambert MRN: [id number] DOB: 06/24/1966 Today's Date: 05/12/2022   History of Present Illness 56 y.o. male presents to [facility] hospital 05/10/2022 with AMS and aphasia. Of note pt with 2 admissions in April 2023 with CVAs. CTA demonstrates L ICA occlusion. Pt received TNK and underwent thrombectomy on 6/3. PMH includes asthma, COPD, HTN, CVA.    PT Comments    Pt admitted with above diagnosis. Pt was able to ambulate with RW with min to mod assist due to incr cues and assist with power up and due to left knee pain. Pt will benefit from continued PT to address all deficits.  Agree with AIR.   Pt currently with functional limitations due to the deficits listed below (see PT Problem List). Pt will benefit from skilled PT to increase their independence and safety with mobility to allow discharge to the venue listed below.      Recommendations for follow up therapy are one component of a multi-disciplinary discharge planning process, led by the attending physician.  Recommendations may be updated based on patient status, additional functional criteria and insurance authorization.  Follow Up Recommendations  Acute inpatient rehab (3hours/day)     Assistance Recommended at Discharge Frequent or constant Supervision/Assistance  Patient can return home with the following Two people to help with walking and/or transfers;A lot of help with bathing/dressing/bathroom;Assistance with cooking/housework;Assistance with feeding;Direct supervision/assist for medications management;Direct supervision/assist for financial management;Assist for transportation;Help with stairs or ramp for entrance   Equipment Recommendations   (TBD)    Recommendations for Other Services Rehab consult     Precautions / Restrictions Precautions Precautions: Fall Restrictions Weight Bearing Restrictions: No     Mobility  Bed Mobility Overal bed mobility: Needs Assistance Bed  Mobility: Supine to Sit     Supine to sit: Min guard, HOB elevated          Transfers Overall transfer level: Needs assistance Equipment used: Rolling walker (2 wheels) Transfers: Sit to/from Stand, Bed to chair/wheelchair/BSC Sit to Stand: +2 physical assistance, Mod assist, From elevated surface           General transfer comment: pt with difficulty extending trunk and for power up from low bed with pt unable to get up on his feet.  Once bed elevated, pt able to stand fully upright. Pt reports he had a left knee aspiration of fluid prior to this admit and that his left knee is very sore.  Pt needed min assist to steady once up on his feet with pt with posterior lean initially.    Ambulation/Gait Ambulation/Gait assistance: Min assist, Mod assist, +2 safety/equipment Gait Distance (Feet): 250 Feet Assistive device: Rolling walker (2 wheels) Gait Pattern/deviations: Step-to pattern, Decreased step length - left, Decreased stance time - left, Decreased weight shift to left, Leaning posteriorly   Gait velocity interpretation: <1.31 ft/sec, indicative of household ambulator   General Gait Details: Initially pt needing mod assist until he was able to get sequencing of feet correct as well as to weight bear on UEs to unweight left LE. Once he did that pt was min assist and cues. Pt was able to incr distance today with ambulation. Followed pt with chair for safety.  Pt was able to complete small circle of unit. No significant LOB once pt learning appropriate gait sequence.  Less impulsive today but does continue to need cues fro safety.   Stairs  Wheelchair Mobility    Modified Rankin (Stroke Patients Only) Modified Rankin (Stroke Patients Only) Pre-Morbid Rankin Score: Moderate disability Modified Rankin: Moderately severe disability     Balance Overall balance assessment: Needs assistance Sitting-balance support: No upper extremity supported, Feet  supported Sitting balance-Leahy Scale: Fair     Standing balance support: Bilateral upper extremity supported, During functional activity Standing balance-Leahy Scale: Poor Standing balance comment: min assit wtih RW, benefits from UE support of an assistive device                            Cognition Arousal/Alertness: Awake/alert Behavior During Therapy: Impulsive Overall Cognitive Status: Impaired/Different from baseline Area of Impairment: Orientation, Following commands, Safety/judgement, Awareness, Problem solving                 Orientation Level: Situation     Following Commands: Follows one step commands inconsistently, Follows one step commands with increased time Safety/Judgement: Decreased awareness of safety, Decreased awareness of deficits Awareness: Intellectual Problem Solving: Slow processing, Difficulty sequencing, Requires verbal cues, Requires tactile cues          Exercises      General Comments General comments (skin integrity, edema, etc.): 134/94, 70 bpm; 143/90, 72 bpm at end of session.  Pt did better with his attention to right today.      Pertinent Vitals/Pain Pain Assessment Pain Assessment: Faces Faces Pain Scale: Hurts even more Pain Location: left knee Pain Descriptors / Indicators: Aching, Sore Pain Intervention(s): Limited activity within patient's tolerance, Monitored during session, Repositioned    Home Living       Type of Home: Apartment                  Prior Function            PT Goals (current goals can now be found in the care plan section) Acute Rehab PT Goals Patient Stated Goal: to return to prior level of function Progress towards PT goals: Progressing toward goals    Frequency    Min 4X/week      PT Plan Current plan remains appropriate    Co-evaluation              AM-PAC PT "6 Clicks" Mobility   Outcome Measure  Help needed turning from your back to your side while  in a flat bed without using bedrails?: A Little Help needed moving from lying on your back to sitting on the side of a flat bed without using bedrails?: A Little Help needed moving to and from a bed to a chair (including a wheelchair)?: A Lot Help needed standing up from a chair using your arms (e.g., wheelchair or bedside chair)?: A Lot Help needed to walk in hospital room?: A Lot Help needed climbing 3-5 steps with a railing? : Total 6 Click Score: 13    End of Session Equipment Utilized During Treatment: Gait belt Activity Tolerance: Patient tolerated treatment well Patient left: in chair;with call bell/phone within reach;with chair alarm set Nurse Communication: Mobility status PT Visit Diagnosis: Unsteadiness on feet (R26.81);Other abnormalities of gait and mobility (R26.89);Other symptoms and signs involving the nervous system (R29.898)     Time: [id number] PT Time Calculation (min) (ACUTE ONLY): 25 min  Charges:  $Gait Training: 8-22 mins $Therapeutic Activity: 8-22 mins                     Naseer Hearn M,PT Acute Rehab  Services [phone] (pager)    Alvira Philips 05/12/2022, 2:50 PM

## 2022-05-12 NOTE — Progress Notes (Addendum)
STROKE TEAM PROGRESS NOTE   INTERVAL HISTORY Patient is seen in his room with no family at the bedside.  He continues to have  some residual right sided weakness and will likely be discharged to CIR next few days..  Vital signs are stable, and he no longer requires Cleviprex for BP control. Patient is interested in participating in the sleep smart study and was given information to review and decide.  He was given enough time to read and ask questions before he decided. Vitals:   05/12/22 1130 05/12/22 1200 05/12/22 1300 05/12/22 1319  BP: (!) 139/106 (!) 153/109 (!) 144/105 (!) 143/105  Pulse: 70 77 64 74  Resp: 17 18 16 17   Temp:  98.3 F (36.8 C)    TempSrc:  Oral    SpO2: 99% 97% 98% 98%   CBC:  Recent Labs  Lab 05/10/22 2006 05/10/22 2328 05/11/22 0249  WBC 7.1  --  9.7  NEUTROABS 3.6  --   --   HGB 11.9* 12.6* 12.8*  HCT 36.3* 37.0* 37.6*  MCV 93.1  --  91.0  PLT 385  --  394    Basic Metabolic Panel:  Recent Labs  Lab 05/10/22 2006 05/10/22 2328 05/11/22 0249  NA 134* 135 131*  K 4.3 3.8 3.8  CL 104  --  101  CO2 20*  --  20*  GLUCOSE 96  --  171*  BUN 10  --  7  CREATININE 0.98  --  1.04  CALCIUM 8.5*  --  8.8*  MG  --   --  1.7  PHOS  --   --  2.5    Lipid Panel:  Recent Labs  Lab 05/11/22 0249  CHOL 140  TRIG 87  HDL 32*  CHOLHDL 4.4  VLDL 17  LDLCALC 91    HgbA1c: No results for input(s): HGBA1C in the last 168 hours. Urine Drug Screen:  Recent Labs  Lab 05/11/22 0405  LABOPIA NONE DETECTED  COCAINSCRNUR NONE DETECTED  LABBENZ NONE DETECTED  AMPHETMU NONE DETECTED  THCU POSITIVE*  LABBARB NONE DETECTED     Alcohol Level  Recent Labs  Lab 05/11/22 0249  ETH <10     IMAGING past 24 hours ECHOCARDIOGRAM LIMITED  Result Date: 05/11/2022    ECHOCARDIOGRAM LIMITED REPORT   Patient Name:   James Lambert Date of Exam: 05/11/2022 Medical Rec #:  07/11/2022      Height:       74.0 in Accession #:    [id number]     Weight:       235.7 lb  Date of Birth:  [birth year]      BSA:          2.330 m Patient Age:    55 years       BP:           142/90 mmHg Patient Gender: M              HR:           79 bpm. Exam Location:  Inpatient Procedure: Limited Echo, Limited Color Doppler and Cardiac Doppler Indications:    stroke  History:        Patient has prior history of Echocardiogram examinations, most                 recent 03/10/2022. COPD; Risk Factors:Current Smoker and                 Hypertension.  Sonographer:    Delcie Roch RDCS Referring Phys: [id number] HUNTER J COLLINS IMPRESSIONS  1. Left ventricular ejection fraction, by estimation, is 60 to 65%. The left ventricle has normal function. The left ventricle has no regional wall motion abnormalities.  2. Right ventricular systolic function is normal. The right ventricular size is normal. Tricuspid regurgitation signal is inadequate for assessing PA pressure.  3. The mitral valve is normal in structure. No evidence of mitral valve regurgitation.  4. The aortic valve is tricuspid. Aortic valve regurgitation is not visualized. Aortic valve sclerosis is present, with no evidence of aortic valve stenosis.  5. The inferior vena cava is normal in size with greater than 50% respiratory variability, suggesting right atrial pressure of 3 mmHg. FINDINGS  Left Ventricle: Left ventricular ejection fraction, by estimation, is 60 to 65%. The left ventricle has normal function. The left ventricle has no regional wall motion abnormalities. The left ventricular internal cavity size was normal in size. There is  no left ventricular hypertrophy. Right Ventricle: The right ventricular size is normal. No increase in right ventricular wall thickness. Right ventricular systolic function is normal. Tricuspid regurgitation signal is inadequate for assessing PA pressure. Left Atrium: Left atrial size was normal in size. Right Atrium: Right atrial size was normal in size. Pericardium: There is no evidence of pericardial  effusion. Mitral Valve: The mitral valve is normal in structure. Aortic Valve: The aortic valve is tricuspid. Aortic valve regurgitation is not visualized. Aortic valve sclerosis is present, with no evidence of aortic valve stenosis. Pulmonic Valve: The pulmonic valve was not well visualized. Pulmonic valve regurgitation is not visualized. Aorta: The aortic root is normal in size and structure. Venous: The inferior vena cava is normal in size with greater than 50% respiratory variability, suggesting right atrial pressure of 3 mmHg. LEFT VENTRICLE PLAX 2D LVIDd:         4.00 cm LVIDs:         2.80 cm LV PW:         1.10 cm LV IVS:        1.00 cm  IVC IVC diam: 2.00 cm LEFT ATRIUM         Index LA diam:    3.60 cm 1.54 cm/m  AORTIC VALVE LVOT Vmax:   145.00 cm/s LVOT Vmean:  96.600 cm/s LVOT VTI:    0.272 m MITRAL VALVE MV Area (PHT): 3.89 cm    SHUNTS MV Decel Time: 195 msec    Systemic VTI: 0.27 m MV E velocity: 89.40 cm/s MV A velocity: 87.50 cm/s MV E/A ratio:  1.02 Mihai Croitoru MD Electronically signed by Thurmon Fair MD Signature Date/Time: 05/11/2022/4:09:30 PM    Final     PHYSICAL EXAM  Physical Exam  Constitutional: Appears well-developed and well-nourished middle-aged African-American male.  Cardiovascular: Normal rate and regular rhythm.  Respiratory: Effort normal, non-labored breathing  Neuro: Mental Status: Patient is awake, alert, oriented to person, place, month, year, and situation. Patient is able to give a clear and coherent history.  No signs of aphasia or neglect.  Speech is fluent with only slight word hesitancy but no paraphasic errors.  Able to name repeat and comprehend well. Cranial Nerves: II: Visual Fields are full. Pupils are equal, round, and reactive to light.   III,IV, VI: EOMI without ptosis or diploplia.  V: Facial sensation is symmetric to temperature VII: Facial movement is symmetric resting and smiling VIII: Hearing is intact to voice X: Phonation is  normal XII: Tongue  protrudes midline without atrophy or fasciculations.  Motor: Tone is normal. Bulk is normal. 5/5 strength was present in LUE and LLE RUE 4/5 mild right grip weakness.  Diminished fine finger movements on the right.  LUE 5/5 RLE 4/5 minimal right lower extremity drift. LLE 5/5 Hand weakness on the right with decreased fine motor movements Sensory: Sensation is symmetric to light touch in the arms and legs.  Cerebellar: FNF and HKS are intact bilaterally    ASSESSMENT/PLAN Mr. BRENSON HARTMAN is a 56 y.o. male with history of asthma, copd, hidradenitis suppurativa, and HTN presenting with confusion and aphasia.  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 Taken for thrombectomy achieving TICI3 revasc. He does have some residual right side weakness. Consider loop recorder prior to discharge if he does not show afib inpatient. Recently had a stroke work up done for embolic appearing strokes at [facility], he had not yet followed up with cardiology or neurology outpatient. Candidate for sleep smart study.   Stroke:  Scattered small acute infarcts in the left cerebral hemisphere and right posterior lentiform nucleus secondary to left ICA occlusion s/p IV TNK and successful thrombectomy with TICI3 revascularization Code Stroke CT head No acute abnormality.  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 MRI scattered small acute infarcts in the left cerebral hemisphere, with an additional punctate acute infarct in the right posterior lentiform nucleus.  2D Echo EF 60-65%, no LVH LDL 91 HgbA1c 5.3 VTE prophylaxis - SCDs    Diet   Diet Heart Room service appropriate? Yes with Assist; Fluid consistency:  Thin   aspirin 81 mg daily prior to admission, now on aspirin 81 mg daily.  Therapy recommendations:  CIR Disposition:  pending  Mechanical thrombectomy of Left ICA with TICI3 revascularization MRI shows scattered infarcts in left hemisphere BP goal 120-140 for 24 hours post procedure  Hypertension Home meds:  none Stable- on cleviprex BP goal 120-140 Long-term BP goal normotensive Start norvasc 10mg   Hyperlipidemia Home meds:  atorvastatin 80mg , resumed in hospital LDL 91, goal < 70 Continue statin at discharge   Other Stroke Risk Factors Cigarette smoker, advised to stop smoking ETOH use, alcohol level <10, advised to drink no more than 2 drink(s) a day On CIWA protocol with ativan Thiamine, folic acid, multi vit ordered Substance abuse - UDS:  THC POSITIVE, Cocaine NONE DETECTED. Patient advised to stop using due to stroke risk. Obesity, There is no height or weight on file to calculate BMI., BMI >/= 30 associated with increased stroke risk, recommend weight loss, diet and exercise as appropriate  Hx stroke/TIA 03/07/2022- full stroke work up done at [facility]- had numbness on the right side  MRI showed multifocal ischemic areas in an embolic distribution  Other Active Problems   Hospital day # 2  Patient seen and examined by NP/APP with MD. MD to update note as needed.   Cortney E 03/09/2022 , MSN, AGACNP-BC [facility] See Amion for schedule and pager information 05/12/2022 2:06 PM  I have personally obtained history,examined this patient, reviewed notes, independently viewed imaging studies, participated in medical decision making and plan of care.ROS completed by me personally and pertinent positives fully documented  I have made any additions or  clarifications directly to the above note. Agree with note above.  Patient continues to show improvement and blood pressures adequately controlled.  We will plan to transfer out of neuro ICU today.  Continue mobilize  out of bed.  Physical occupational and rehab consults.  Will add Plavix to his aspirin due to severe focal terminal right ICA stenosis.  Aggressive risk factor modification.  Patient counseled to quit smoking marijuana.  Also counseled to limit alcohol intake to not more than 1 or 2 drinks per day.  Patient is interested in participating in the sleep smart study for stroke prevention as he has history of sleep apnea but has not been able to afford his CPAP mask.  I explained to him clearly that he has a 50% chance that he will be randomized into the study to get the mask and 50% chance that he will be on medical treatment.  He understands.  He was given information to review and plenty of time to decide and ask questions.  Patient has signed informed consent will undergo the NOx 3 screening tonight Greater than 50% time during this 50-minute visit was spent in counseling and coordination of care and discussion patient care team and answering questions. Delia HeadyPramod Leno Mathes, MD Medical Director [facility] Pager: [phone] 05/12/2022 4:52 PM    To contact Stroke Continuity provider, please refer to [email or web address]. After hours, contact General Neurology

## 2022-05-12 NOTE — Progress Notes (Signed)
Inpatient Rehab Admissions Coordinator:   I met with Pt. At bedside to discuss CIR admit. Pt. States that he is interested and that he has a brother who can be with him 24/7.  He confirms he is uninsured and is agreeable to cost of CIR as a self pay pt.  Clemens Catholic, [place], [place] Admissions Coordinator  [phone] ([place]) [phone] (office)

## 2022-05-12 NOTE — Progress Notes (Addendum)
**Note James-Identified via Obfuscation** Referring Physician(s): James Lambert  Supervising Physician: James Lambert, James Lambert  Patient Status:  [facility]  Chief Complaint:  James Lambert. Left ICA occlusion. S/p mechanical thrombectomy and direct aspiration with complete recanalization (TICI3) with Dr. Kirtland Lambert. James Lambert  Subjective:  Patients that he is doing better overall. He reports some tenderness at the right groin access site and residual right sided weakness that is improving.   Allergies: Patient has no known allergies.  Medications: Prior to Admission medications   Medication Sig Start Date End Date Taking? Authorizing Provider  amLODipine (NORVASC) 10 Lambert tablet Take 1 tablet (10 Lambert total) by mouth once daily. 03/21/22  Yes James Lambert, David, James Lambert  amoxicillin-clavulanate (AUGMENTIN) 875-125 Lambert tablet Take 1 tablet by mouth 2 (two) times daily. Patient taking differently: Take 1 tablet by mouth 2 (two) times daily. 10 day course. Pt has 3 tablets left. 05/01/22  Yes James Lambert, James Lambert, James Lambert  James Lambert succinate (James Lambert) 25 Lambert 24 hr tablet Take 1 tablet (25 Lambert total) by mouth once daily. 03/11/22  Yes James Lambert, Tina, James Lambert  James Lambert chewable tablet Chew 1 tablet (81 Lambert total) by mouth once daily. Patient not taking: Reported on 05/11/2022 03/20/22   James Lambert, David, James Lambert  James Lambert (LIPITOR) 80 Lambert tablet Take 1 tablet (80 Lambert total) by mouth once daily. Patient not taking: Reported on 05/11/2022 03/12/22   James Lambert, Tina, James Lambert  clopidogrel (PLAVIX) 75 Lambert tablet Take 1 tablet (75 Lambert total) by mouth once daily. Patient not taking: Reported on 05/11/2022 03/12/22   James Lambert, Tina, James Lambert  James Lambert (NORCO/VICODIN) 5-325 Lambert tablet Take 1 tablet by mouth every 6 (six) hours as needed for moderate pain. Patient not taking: Reported on 05/11/2022 05/01/22 05/01/23  James Lambert, James Lambert, James Lambert     Vital Signs: BP (!) 142/91   Pulse 66   Temp 97.9 F (36.6 C) (Oral)   Resp 13   SpO2 99%   Physical Exam Vitals and nursing note  reviewed.  Constitutional:      Appearance: He is well-developed.  HENT:     Head: Normocephalic.  Pulmonary:     Effort: Pulmonary effort is normal.  Musculoskeletal:        General: Normal range of motion.     Cervical back: Normal range of motion.  Skin:    General: Skin is dry.  Neurological:     Mental Status: He is alert and oriented to person, place, and time.     Comments: Alert, aware and oriented X 3 Speech and comprehension is intact.  No facial droop noted Can spontaneously move all 4 extremities.  Hand grip strength slightly weaker on the right.   Right lower extremity weaker than left no drift noted.  Comprehension and fluency are normal.  Judgment and insight normal   Negative pronator drift. Fine motor and coordination grossly in tact Gait not assessed Romberg not assessed Heel to toe not assessed Distal pulses not assessed     Imaging: MR BRAIN WO CONTRAST  Result Date: 05/11/2022 CLINICAL DATA:  Lambert, follow-up; status post neuro interventional procedure for left ICA occlusion EXAM: MRI HEAD WITHOUT CONTRAST TECHNIQUE: Multiplanar, multiecho pulse sequences of the brain and surrounding structures were obtained without intravenous contrast. COMPARISON:  03/18/2022 FINDINGS: Evaluation is somewhat limited by motion artifact. Brain: Scattered areas of restricted diffusion with ADC correlates in the left occipital cortex (series 5, image 73 76), posterior left temporal lobe (series 5, image 78), left parietal lobe (series 5,  images 81-86 and 89-90), and left frontal lobe cortex and white matter (series 5, images 77, 80, 83, 87-95). Additional punctate focus of restricted diffusion in the right posterior lentiform nucleus (series 5, image 77). No acute hemorrhage, mass, mass effect, or midline shift. No hydrocephalus or extra-axial collection. Vascular: Normal arterial flow voids. Skull and upper cervical spine: Normal marrow signal. Sinuses/Orbits: Mucosal  thickening in the right maxillary sinus and left sphenoid sinus. The orbits are unremarkable. Other: The mastoids are well aerated. IMPRESSION: Evaluation is somewhat limited by motion artifact. Within this limitation, there are scattered small acute infarcts in the left cerebral hemisphere, with an additional punctate acute infarct in the right posterior lentiform nucleus. No evidence of hemorrhagic transformation. Electronically Signed   By: James Lambert M.D.   On: 05/11/2022 14:13   CT HEAD James Lambert WO CONTRAST  Result Date: 05/10/2022 CLINICAL DATA:  James Lambert. Initial evaluation for neuro deficit, Lambert suspected. EXAM: CT HEAD WITHOUT CONTRAST TECHNIQUE: Contiguous axial images were obtained from the base of the skull through the vertex without intravenous contrast. RADIATION DOSE REDUCTION: This exam was performed according to the departmental dose-optimization program which includes automated exposure control, adjustment of the mA and/or kV according to patient size and/or use of iterative reconstruction technique. COMPARISON:  Previous MRI from 03/18/2022. FINDINGS: Brain: Age-related cerebral atrophy. Small remote cortical infarct at the high posterior left parietal lobe. Additional remote left cerebellar infarct. Small remote lacunar infarct at the left basal ganglia. No acute intracranial hemorrhage. No visible acute large vessel territory infarct. No mass lesion or midline shift. No hydrocephalus or extra-axial fluid collection. Vascular: No hyperdense vessel. Calcified atherosclerosis present at skull base. Skull: Scalp soft tissues within normal limits.  Calvarium intact. Sinuses/Orbits: Left gaze noted. Globes orbital soft tissues demonstrate no other acute finding. Remote posttraumatic defect at the left lamina papyracea noted. Chronic mucoperiosteal thickening noted about the ethmoidal air cells and visualized right maxillary sinus. Mastoid air cells are clear. Other: None. ASPECTS  [facility] Lambert Program Early CT Score) - Ganglionic level infarction (caudate, lentiform nuclei, internal capsule, insula, M1-M3 cortex): 7 - Supraganglionic infarction (M4-M6 cortex): 3 Total score (0-10 with 10 being normal): 10 IMPRESSION: 1. No acute intracranial abnormality 2. Ten.  ASPECTS is 3. 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 These results were communicated to Dr. Otelia Limes at 8:13 pm on 05/10/2022 by text page via the [facility] messaging system. Electronically Signed   By: Rise Mu M.D.   On: 05/10/2022 20:20   ECHOCARDIOGRAM LIMITED  Result Date: 05/11/2022    ECHOCARDIOGRAM LIMITED REPORT   Patient Name:   James Lambert Date of Exam: 05/11/2022 Medical Rec #:  [id number]      Height:       74.0 in Accession #:    [id number]     Weight:       235.7 lb Date of Birth:  15-Dec-1965      BSA:          2.330 m Patient Age:    55 years       BP:           142/90 mmHg Patient Gender: M              HR:           79 bpm. Exam Location:  Inpatient Procedure: Limited Echo, Limited Color Doppler and Cardiac Doppler Indications:    Lambert  History:  Patient has prior history of Echocardiogram examinations, most                 recent 03/10/2022. COPD; Risk Factors:Current Smoker and                 Hypertension.  Sonographer:    Delcie Roch RDCS Referring Phys: [id number] HUNTER J COLLINS IMPRESSIONS  1. Left ventricular ejection fraction, by estimation, is 60 to 65%. The left ventricle has normal function. The left ventricle has no regional wall motion abnormalities.  2. Right ventricular systolic function is normal. The right ventricular size is normal. Tricuspid regurgitation signal is inadequate for assessing PA pressure.  3. The mitral valve is normal in structure. No evidence of mitral valve regurgitation.  4. The aortic valve is tricuspid. Aortic valve regurgitation is not visualized. Aortic valve sclerosis is present, with no  evidence of aortic valve stenosis.  5. The inferior vena cava is normal in size with greater than 50% respiratory variability, suggesting right atrial pressure of 3 mmHg. FINDINGS  Left Ventricle: Left ventricular ejection fraction, by estimation, is 60 to 65%. The left ventricle has normal function. The left ventricle has no regional wall motion abnormalities. The left ventricular internal cavity size was normal in size. There is  no left ventricular hypertrophy. Right Ventricle: The right ventricular size is normal. No increase in right ventricular wall thickness. Right ventricular systolic function is normal. Tricuspid regurgitation signal is inadequate for assessing PA pressure. Left Atrium: Left atrial size was normal in size. Right Atrium: Right atrial size was normal in size. Pericardium: There is no evidence of pericardial effusion. Mitral Valve: The mitral valve is normal in structure. Aortic Valve: The aortic valve is tricuspid. Aortic valve regurgitation is not visualized. Aortic valve sclerosis is present, with no evidence of aortic valve stenosis. Pulmonic Valve: The pulmonic valve was not well visualized. Pulmonic valve regurgitation is not visualized. Aorta: The aortic root is normal in size and structure. Venous: The inferior vena cava is normal in size with greater than 50% respiratory variability, suggesting right atrial pressure of 3 mmHg. LEFT VENTRICLE PLAX 2D LVIDd:         4.00 cm LVIDs:         2.80 cm LV PW:         1.10 cm LV IVS:        1.00 cm  IVC IVC diam: 2.00 cm LEFT ATRIUM         Index LA diam:    3.60 cm 1.54 cm/m  AORTIC VALVE LVOT Vmax:   145.00 cm/s LVOT Vmean:  96.600 cm/s LVOT VTI:    0.272 m MITRAL VALVE MV Area (PHT): 3.89 cm    SHUNTS MV Decel Time: 195 msec    Systemic VTI: 0.27 m MV E velocity: 89.40 cm/s MV A velocity: 87.50 cm/s MV E/A ratio:  1.02 Mihai Croitoru James Lambert Electronically signed by Thurmon Fair James Lambert Signature Date/Time: 05/11/2022/4:09:30 PM    Final    CT  ANGIO HEAD NECK W WO CM W PERF (James Lambert)  Result Date: 05/10/2022 CLINICAL DATA:  Initial evaluation for acute Lambert. EXAM: CT ANGIOGRAPHY HEAD AND NECK CT PERFUSION BRAIN TECHNIQUE: Multidetector CT imaging of the head and neck was performed using the standard protocol during bolus administration of intravenous contrast. Multiplanar CT image reconstructions and MIPs were obtained to evaluate the vascular anatomy. Carotid stenosis measurements (when applicable) are obtained utilizing NASCET criteria, using the distal internal carotid diameter as the denominator.  Multiphase CT imaging of the brain was performed following IV bolus contrast injection. Subsequent parametric perfusion maps were calculated using RAPID software. RADIATION DOSE REDUCTION: This exam was performed according to the departmental dose-optimization program which includes automated exposure control, adjustment of the mA and/or kV according to patient size and/or use of iterative reconstruction technique. CONTRAST:  OMNIPAQUE IOHEXOL 350 Lambert/ML SOLN COMPARISON:  Prior head CT from earlier the same day. FINDINGS: CTA NECK FINDINGS Aortic arch: Visualized aortic arch normal in caliber with standard 3 vessel morphology. Mild plaque about the arch and origin the great vessels without significant stenosis. Right carotid system: Right CCA widely patent. Calcified plaque about the right carotid bulb/proximal right ICA with associated stenosis of up to 55% by NASCET criteria. Right ICA patent distally without stenosis or dissection. Left carotid system: Left CCA patent from its origin to the bifurcation. There is acute occlusion of the proximal cervical left ICA just distal to the bifurcation (series 7, image 216) left ICA remains occluded to the skull base. Vertebral arteries: Both vertebral arteries arise from subclavian arteries. No proximal subclavian artery stenosis. Vertebral arteries patent without stenosis or dissection. Skeleton: No  discrete or worrisome osseous lesions. Other neck: No other acute soft tissue abnormality within the neck. Upper chest: Visualized upper chest demonstrates no acute finding. Review of the MIP images confirms the above findings CTA HEAD FINDINGS Anterior circulation: Scattered atheromatous change within the right carotid siphon with associated moderate to severe multifocal stenosis. Left ICA occluded at the skull base. Distal reconstitution at the supraclinoid segment. Probable small amount of thrombus/clot seen protruding into the lumen at the supraclinoid left ICA (series 8, image 135). A1 segments patent. Normal anterior communicating complex. Anterior cerebral arteries patent to their distal aspects without stenosis. M1 segments remain patent without stenosis or occlusion. No visible proximal MCA branch occlusion. Distal MCA branches remain patent. No visible significant downstream left MCA branch occlusion. Posterior circulation: Extensive atheromatous disease seen involving the V4 segments bilaterally with associated mild to moderate multifocal narrowing on the left with moderate to severe narrowing on the right. Left vertebral artery slightly dominant. Atheromatous disease extends into the basilar artery with associated moderate to severe multifocal stenoses involving the proximal and mid basilar artery. Basilar remains patent to its distal aspect. Superior cerebral arteries patent bilaterally. Both PCAs primarily supplied basilar and remain patent to their distal aspects. Venous sinuses: Grossly patent allowing for timing the contrast bolus. Anatomic variants: None significant.  No aneurysm. Review of the MIP images confirms the above findings CT Brain Perfusion Findings: ASPECTS: 10. CBF (<30%) Volume: 4mL Perfusion (Tmax>6.0s) volume: Mismatch Volume: Infarction Location:Small acute core infarct present at the anterior left frontal lobe. Extremely large surrounding ischemic penumbra involving  the majority of the left MCA distribution. IMPRESSION: 1. Positive CTA for emergent large vessel occlusion, with abrupt occlusion of the proximal cervical left ICA just distal to the bifurcation. Distal reconstitution at the carotid siphon with probable small amount of thrombus/clot protruding into the lumen at the supraclinoid left ICA. No other visible downstream embolic left MCA occlusion. 2. 4 cc acute core infarct at the anterior left frontal lobe, with large surrounding ischemic penumbra, mismatch volume measures 236 mL. 3. Atheromatous change about the right carotid bulb/proximal right ICA with associated stenosis of up to 55% by NASCET criteria. 4. Extensive atheromatous change throughout the intracranial circulation, with associated moderate to severe multifocal stenoses involving the right carotid siphon, V4 segments, and basilar artery as above.  Critical Value/emergent results were called by telephone at the time of interpretation on 05/10/2022 at 8:26 pm to provider [facility] , who verbally acknowledged these results. Electronically Signed   By: Rise Mu M.D.   On: 05/10/2022 20:44    Labs:  CBC: Recent Labs    03/21/22 0440 05/01/22 1248 05/10/22 2006 05/10/22 2328 05/11/22 0249  WBC 7.2 6.1 7.1  --  9.7  HGB 10.9* 11.4* 11.9* 12.6* 12.8*  HCT 33.3* 36.5* 36.3* 37.0* 37.6*  PLT 371 376 385  --  394    COAGS: Recent Labs    03/08/22 1949 05/10/22 2006  INR 1.2 1.1  APTT 35 34    BMP: Recent Labs    03/21/22 0440 05/01/22 1248 05/10/22 2006 05/10/22 2328 05/11/22 0249  NA 132* 131* 134* 135 131*  K 3.6 3.6 4.3 3.8 3.8  CL 100 103 104  --  101  CO2 25 20* 20*  --  20*  GLUCOSE 142* 81 96  --  171*  BUN --  7  CALCIUM 8.4* 8.8* 8.5*  --  8.8*  CREATININE 0.89 0.98 0.98  --  1.04  GFRNONAA >60 >60 >60  --  >60    LIVER FUNCTION TESTS: Recent Labs    03/08/22 1949 05/01/22 1248 05/10/22 2006 05/11/22 0249  BILITOT 0.4 0.4 0.6 0.2*   AST 16 11* 14* 12*  ALT ALKPHOS 69 60 64 74  PROT 8.4* 8.0 8.5* 8.1  ALBUMIN 3.4* 3.2* 3.2* 3.3*    Assessment and Plan:  56 y.o. male inpatient. Smoker. History of COPD, HTN, Alcohol use. [facility] the ED [facility] 6[facility]23 with aphasia. 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 Patient transferred to [facility] for intervention. S/p mechanical thrombectomy and direct aspiration with complete recanalization (TICI3) with Dr. Ainsley Spinner  Patient seen at bedside with  Dr. Kirtland Bouchard. Tommie Sams. Patient is able to move all 4 extremities. Right  upper and lower extremity weaker than the left. No other neurological deficits noted. Site is soft with no active bleeding and no appreciable pseudoaneurysm. No dressing present. MR Brain follow up from 6.4.23 reads. Evaluation is somewhat limited by motion artifact. Within this limitation, there are scattered small acute infarcts in the left cerebral hemisphere, with an additional punctate acute infarct in the right posterior lentiform nucleus. No evidence of hemorrhagic transformation.  Patient to follow up with Dr. Kirtland Bouchard. Tommie Sams in 3 months time. Message sent to IR schedulers to coordinate appointment.    Electronically Signed: Alene Mires, NP 05/12/2022, 11:24 AM   I spent a total of 15 Minutes at the patient's bedside AND on the patient's hospital floor or unit, greater than 50% of which was counseling/coordinating care for Left ICA occlusion. S/p mechanical thrombectomy and direct aspiration with complete recannulization

## 2022-05-12 NOTE — PMR Pre-admission (Signed)
PMR Admission Coordinator Pre-Admission Assessment  Patient: James Lambert is an 56 y.o., male MRN: [id number] DOB: May 02, 1966 Height:   Weight:    Insurance Information HMO:     PPO:      PCP:      IPA:      80/20:      OTHER:  PRIMARY: None  SECONDARY:       Policy#:      Phone#:   Development worker, community:       Phone#:   The "Data Collection Information Summary" for patients in Inpatient Rehabilitation Facilities with attached "Privacy Act [place] Records" was provided and verbally reviewed with: pt.  Emergency Contact Information Contact Information     Name Relation Home Work Mobile   [place] Brother   [phone]   Tania Ade [phone]         Current Medical History  Patient Admitting Diagnosis:  History of Present Illness: Pt. Is a 56 y.o. male  with PMH includes asthma, COPD, HTN, CVA. presented to [facility] hospital 05/10/2022 with AMS and aphasia.CTA demonstrates L ICA occlusion. Pt received TNK and underwent thrombectomy on 6/3. PT/OT/SLP saw and recommended CIR to assist return to PLOF.  Complete NIHSS TOTAL: 9  Patient's medical record from [facility] has been reviewed by the rehabilitation admission coordinator and physician.  Past Medical History  Past Medical History:  Diagnosis Date   Asthma    COPD (chronic obstructive pulmonary disease) (HCC)    Hidradenitis suppurativa    diagnosed in [facility] ED based on history and physical exam   Hypertension     Has the patient had major surgery during 100 days prior to admission? Yes  Family History   family history is not on file.  Current Medications  Current Facility-Administered Medications:    acetaminophen (TYLENOL) tablet 650 mg, 650 mg, Oral, Q4H PRN **OR** acetaminophen (TYLENOL) 160 MG/5ML solution 650 mg, 650 mg, Per Tube, Q4H PRN **OR** acetaminophen (TYLENOL) suppository 650 mg, 650 mg, Rectal, Q4H PRN, Theda Sers, Unk Pinto, MD   amLODipine (NORVASC) tablet 10  mg, 10 mg, Oral, Daily, Shafer, Devon, NP, 10 mg at 05/12/22 2536   aspirin EC tablet 81 mg, 81 mg, Oral, Daily, Shafer, Devon, NP, 81 mg at 05/12/22 6440   Chlorhexidine Gluconate Cloth 2 % PADS 6 each, 6 each, Topical, Daily, Garvin Fila, MD, 6 each at 05/12/22 1018   clevidipine (CLEVIPREX) infusion 0.5 mg/mL, 0-32 mg/hr, Intravenous, Continuous, Garvin Fila, MD, Stopped at 34/74/25 9563   folic acid (FOLVITE) tablet 1 mg, 1 mg, Oral, Daily, Bowser, Grace E, NP, 1 mg at 05/12/22 0918   labetalol (NORMODYNE) injection 20 mg, 20 mg, Intravenous, Q2H PRN, Charlean Merl, Marcelino Scot, NP, 20 mg at 05/12/22 1357   LORazepam (ATIVAN) tablet 1-4 mg, 1-4 mg, Oral, Q1H PRN **OR** LORazepam (ATIVAN) injection 1-4 mg, 1-4 mg, Intravenous, Q1H PRN, Bowser, Laurel Dimmer, NP   metoprolol succinate (TOPROL-XL) 24 hr tablet 25 mg, 25 mg, Oral, Daily, Shafer, Devon, NP, 25 mg at 05/12/22 8756   multivitamin with minerals tablet 1 tablet, 1 tablet, Oral, Daily, Bowser, Grace E, NP, 1 tablet at 05/12/22 0918   ondansetron (ZOFRAN) injection 4 mg, 4 mg, Intravenous, Q6H PRN, de Sindy Messing, Doon, MD   pantoprazole (PROTONIX) injection 40 mg, 40 mg, Intravenous, QHS, Collins, Unk Pinto, MD, 40 mg at 05/11/22 2146   senna-docusate (Senokot-S) tablet 1 tablet, 1 tablet, Oral, BID, Gwinda Maine, MD, 1 tablet at 05/12/22 [phone]  thiamine tablet 100 mg, 100 mg, Oral, Daily, 100 mg at 05/12/22 1497 **OR** thiamine (B-1) injection 100 mg, 100 mg, Intravenous, Daily, Bowser, Laurel Dimmer, NP  Patients Current Diet:  Diet Order             Diet Heart Room service appropriate? Yes with Assist; Fluid consistency: Thin  Diet effective now                   Precautions / Restrictions Precautions Precautions: Fall Restrictions Weight Bearing Restrictions: No   Has the patient had 2 or more falls or a fall with injury in the past year? No  Prior Activity Level Community (5-7x/wk): Pt. active in the community  PTA  Prior Functional Level Self Care: Did the patient need help bathing, dressing, using the toilet or eating? Independent  Indoor Mobility: Did the patient need assistance with walking from room to room (with or without device)? Independent  Stairs: Did the patient need assistance with internal or external stairs (with or without device)? Independent  Functional Cognition: Did the patient need help planning regular tasks such as shopping or remembering to take medications? Needed some help  Patient Information Are you of Hispanic, Latino/a,or Spanish origin?: A. No, not of Hispanic, Latino/a, or Spanish origin What is your race?: B. Black or African American Do you need or want an interpreter to communicate with a doctor or health care staff?: 1. Yes  Patient's Response To:  Health Literacy and Transportation Is the patient able to respond to health literacy and transportation needs?: Yes Health Literacy - How often do you need to have someone help you when you read instructions, pamphlets, or other written material from your doctor or pharmacy?: Never In the past 12 months, has lack of transportation kept you from medical appointments or from getting medications?: Yes In the past 12 months, has lack of transportation kept you from meetings, work, or from getting things needed for daily living?: No  Development worker, international aid / Equipment Home Equipment: BSC/3in1, Civil engineer, contracting, Grab bars - tub/shower, Radio producer - single point, Rollator (4 wheels), Other (comment)  Prior Device Use: Indicate devices/aids used by the patient prior to current illness, exacerbation or injury? Walker  Current Functional Level Cognition  Overall Cognitive Status: Difficult to assess Orientation Level: Oriented X4 Following Commands: Follows one step commands inconsistently, Follows one step commands with increased time Safety/Judgement: Decreased awareness of safety, Decreased awareness of deficits    Extremity  Assessment (includes Sensation/Coordination)  Upper Extremity Assessment: RUE deficits/detail RUE Deficits / Details: reports shoulder is sore, AROM WFL  Lower Extremity Assessment: LLE deficits/detail, RLE deficits/detail RLE Deficits / Details: pt with 4/5 strength grossly, ROM WFL LLE Deficits / Details: pt with functional ROM and strength, pt reports pain intermittently in L foot, then left lower leg, later no pain    ADLs  Overall ADL's : Needs assistance/impaired Eating/Feeding: Sitting, Minimal assistance Grooming: Sitting, Minimal assistance Upper Body Bathing: Sitting, Minimal assistance Lower Body Bathing: Moderate assistance Lower Body Bathing Details (indicate cue type and reason): min A +2 sit<>stand Upper Body Dressing : Moderate assistance, Sitting Lower Body Dressing: Total assistance Lower Body Dressing Details (indicate cue type and reason): min A +2 sit<>stand Toilet Transfer: Min guard, Stand-pivot, +2 for safety/equipment Toilet Transfer Details (indicate cue type and reason): Pt comes up to stand and stays flexed at hips ~90 degrees to turn from bed to recliner Toileting- Clothing Manipulation and Hygiene: Total assistance Toileting - Clothing Manipulation Details (indicate  cue type and reason): min A +2 sit<>stand    Mobility  Overal bed mobility: Needs Assistance Bed Mobility: Supine to Sit Supine to sit: Min guard, HOB elevated    Transfers  Overall transfer level: Needs assistance Equipment used: 1 person hand held assist, None Transfers: Sit to/from Stand, Bed to chair/wheelchair/BSC Sit to Stand: Min assist, +2 physical assistance Bed to/from chair/wheelchair/BSC transfer type:: Step pivot Step pivot transfers: Min guard General transfer comment: pt with difficulty extending trunk, performing step pivot in flexed position at trunk, utilizing UE support of bed rail and armrest for support. Quick descent into sitting with stand to sit    Ambulation /  Gait / Stairs / Wheelchair Mobility  Ambulation/Gait Ambulation/Gait assistance:  (deferred 2/2 safety concerns)    Posture / Balance Balance Overall balance assessment: Needs assistance Sitting-balance support: No upper extremity supported, Feet supported Sitting balance-Leahy Scale: Fair Standing balance support: Single extremity supported Standing balance-Leahy Scale: Poor Standing balance comment: minG-minA, pt resistant to assistance from therapists, benefits from UE support of an assistive device or railing    Special needs/care consideration Skin intact  and Special service needs     Previous Home Environment (from acute therapy documentation) Living Arrangements: Other relatives (brother) Available Help at Discharge: Family, Friend(s), Available PRN/intermittently Type of Home: Apartment (pt reports apartment, chart denotes group home) Home Layout: One level Home Access: Stairs to enter Entrance Stairs-Rails: Can reach both Entrance Stairs-Number of Steps: 5 Bathroom Shower/Tub: Tub/shower unit, Architectural technologist: Standard Bathroom Accessibility: Yes How Accessible: Accessible via walker [place]: Yes Additional Comments: pt reports living in an apartment, notes earlier in admission reports the pt came from a group home  Discharge Living Setting Plans for Discharge Living Setting: Patient's home Type of Home at Discharge: Apartment Discharge Home Layout: One level Discharge Home Access: Stairs to enter Entrance Stairs-Rails: Right Entrance Stairs-Number of Steps: 2 Discharge Bathroom Shower/Tub: Tub/shower unit  [place] Patient Roles: Other (Comment) Contact Information: [name] (brother) [phone] Anticipated Caregiver: Randall Hiss (brother) [phone] Anticipated Caregiver's Contact Information: Randall Hiss is disabled, can provide 24/7 supervision. Have aide in home 8 hrs. [name] to assist as needed (directs care) Ability/Limitations of  Caregiver: Can provide supervision Caregiver Availability: 24/7 Discharge Plan Discussed with Primary Caregiver: Yes Is Caregiver In Agreement with Plan?: Yes Does Caregiver/Family have Issues with Lodging/Transportation while Pt is in Rehab?: No  Goals Patient/Family Goal for Rehab: PT/OT/SLP  Mod I Expected length of stay: 5-7 days Pt/Family Agrees to Admission and willing to participate: Yes Program Orientation Provided & Reviewed with Pt/Caregiver Including Roles  & Responsibilities: Yes  Decrease burden of Care through IP rehab admission: Specialzed equipment needs, Decrease number of caregivers, Bowel and bladder program, and Patient/family education  Possible need for SNF placement upon discharge: not anticipated  Patient Condition: I have reviewed medical records from [facility], spoken with CM, and patient and family member. I met with patient at the bedside for inpatient rehabilitation assessment.  Patient will benefit from ongoing PT, OT, and SLP, can actively participate in 3 hours of therapy a day 5 days of the week, and can make measurable gains during the admission.  Patient will also benefit from the coordinated team approach during an Inpatient Acute Rehabilitation admission.  The patient will receive intensive therapy as well as Rehabilitation physician, nursing, social worker, and care management interventions.  Due to safety, disease management, medication administration, pain management, and patient education the patient requires 24 hour a day  rehabilitation nursing.  The patient is currently Min A-Min G with mobility and basic ADLs.  Discharge setting and therapy post discharge at home with home health is anticipated.  Patient has agreed to participate in the Acute Inpatient Rehabilitation Program and will admit today.  Preadmission Screen Completed By:  Genella Mech, 05/12/2022 2:05 PM ______________________________________________________________________    Discussed status with Dr. Curlene Dolphin on 05/16/22 at 88 and received approval for admission today.  Admission Coordinator:  Genella Mech, CCC-SLP, time 1107/Date 05/16/22   Assessment/Plan: Diagnosis: Does the need for close, 24 hr/day Medical supervision in concert with the patient's rehab needs make it unreasonable for this patient to be served in a less intensive setting? Yes Co-Morbidities requiring supervision/potential complications: asthma, COPD, HTN, CVA Due to bladder management, bowel management, safety, skin/wound care, disease management, medication administration, pain management, and patient education, does the patient require 24 hr/day rehab nursing? Yes Does the patient require coordinated care of a physician, rehab nurse, PT, OT, and SLP to address physical and functional deficits in the context of the above medical diagnosis(es)? Yes Addressing deficits in the following areas: balance, endurance, locomotion, strength, transferring, bowel/bladder control, bathing, dressing, feeding, grooming, toileting, cognition, speech, language, swallowing, and psychosocial support Can the patient actively participate in an intensive therapy program of at least 3 hrs of therapy 5 days a week? Yes The potential for patient to make measurable gains while on inpatient rehab is excellent Anticipated functional outcomes upon discharge from inpatient rehab: modified independent PT, modified independent OT, modified independent SLP Estimated rehab length of stay to reach the above functional goals is: 5-7days Anticipated discharge destination: Home 10. Overall Rehab/Functional Prognosis: excellent   MD Signature: Jennye Boroughs

## 2022-05-12 NOTE — TOC CAGE-AID Note (Signed)
Transition of Care [facility]) - CAGE-AID Screening   Patient Details  Name: James Lambert MRN: [id number] Date of Birth: 10-Apr-1966  Transition of Care [facility]) CM/SW Contact:    Lossie Faes Tarpley-Carter, LCSWA Phone Number: 05/12/2022, 2:55 PM   Clinical Narrative: Pt participated in Cage-Aid.  Pt stated he does use substance and ETOH.  Pt was offered resources, due to usage of substance and ETOH.    Natalie Leclaire Tarpley-Carter, MSW, LCSW-A Pronouns:  She/Her/Hers [facility]itions of [facility] Clinical Social Worker Direct Number:  [phone] [email or web address]  CAGE-AID Screening:    Have You Ever Felt You Ought to Cut Down on Your Drinking or Drug Use?: Yes Have People Annoyed You By [employer] Your Drinking Or Drug Use?: No Have You Felt Bad Or Guilty About Your Drinking Or Drug Use?: No Have You Ever Had a Drink or Used Drugs First Thing In The Morning to Steady Your Nerves or to Get Rid of a Hangover?: No CAGE-AID Score: 1  Substance Abuse Education Offered: Yes  Substance abuse interventions: Transport planner

## 2022-05-13 MED ORDER — CLOPIDOGREL BISULFATE 75 MG PO TABS
75.0000 mg | ORAL_TABLET | Freq: Every day | ORAL | Status: DC
Start: 2022-05-13 — End: 2022-05-16
  Administered 2022-05-13 – 2022-05-16 (×4): 75 mg via ORAL
  Filled 2022-05-13 (×4): qty 1

## 2022-05-13 MED ORDER — PANTOPRAZOLE SODIUM 40 MG PO TBEC
40.0000 mg | DELAYED_RELEASE_TABLET | Freq: Every day | ORAL | Status: DC
Start: 2022-05-13 — End: 2022-05-16
  Administered 2022-05-13 – 2022-05-15 (×3): 40 mg via ORAL
  Filled 2022-05-13 (×3): qty 1

## 2022-05-13 NOTE — Progress Notes (Signed)
  Transition of Care [facility]) Screening Note   Patient Details  Name: James Lambert Date of Birth: 07-24-1966   Transition of Care [facility]) CM/SW Contact:    Glennon Mac, RN Phone Number: 05/13/2022, 5:03 PM    Transition of Care Department [facility]) has reviewed patient and no TOC needs have been identified at this time. We will continue to monitor patient advancement through interdisciplinary progression rounds. If new patient transition needs arise, please place a TOC consult.  Quintella Baton, RN, BSN  Trauma/Neuro ICU Case Manager [phone]

## 2022-05-13 NOTE — H&P (Incomplete)
Physical Medicine and Rehabilitation Admission H&P    CC: Stroke with functional deficits   HPI:  James Lambert is a 56 year old male with history of HTN, COPD, asthma, multiple strokes in past 3 months with residual right sided weakness who was admitted from his group  home with sudden onset of confusion and difficulty speaking.   ROS Past Medical History:  Diagnosis Date   Asthma    COPD (chronic obstructive pulmonary disease) (HCC)    Hidradenitis suppurativa    diagnosed in [facility] ED based on history and physical exam   Hypertension    Past Surgical History:  Procedure Laterality Date   IR CT HEAD LTD  05/10/2022   IR FLUORO GUIDED NEEDLE PLC ASPIRATION/INJECTION LOC  03/20/2022   IR PERCUTANEOUS ART THROMBECTOMY/INFUSION INTRACRANIAL INC DIAG ANGIO  05/10/2022   IR US GUIDE VASC ACCESS RIGHT  05/10/2022   RADIOLOGY WITH ANESTHESIA N/A 05/10/2022   Procedure: IR WITH ANESTHESIA;  Surgeon: Julieanne Cotton, MD;  Location: [facility] OR;  Service: Radiology;  Laterality: N/A;   TEE WITHOUT CARDIOVERSION N/A 03/11/2022   Procedure: TRANSESOPHAGEAL ECHOCARDIOGRAM (TEE);  Surgeon: Lamar Blinks, MD;  Location: [facility] ORS;  Service: Cardiovascular;  Laterality: N/A;     No family history on file.   Social History:  reports that he has been smoking cigarettes. He has been smoking an average of .5 packs per day. He has never used smokeless tobacco. Per family he drinks 120 ounces of beer and 12 ounces of liquor daily and current drug use. Drug: Marijuana.   Allergies: No Known Allergies   Medications Prior to Admission  Medication Sig Dispense Refill   amLODipine (NORVASC) 10 MG tablet Take 1 tablet (10 mg total) by mouth once daily. 30 tablet 3   amoxicillin-clavulanate (AUGMENTIN) 875-125 MG tablet Take 1 tablet by mouth 2 (two) times daily. (Patient taking differently: Take 1 tablet by mouth 2 (two) times daily. 10 day course. Pt has 3 tablets left.) 20 tablet 0   metoprolol  succinate (TOPROL-XL) 25 MG 24 hr tablet Take 1 tablet (25 mg total) by mouth once daily. 30 tablet 2   aspirin 81 MG chewable tablet Chew 1 tablet (81 mg total) by mouth once daily. (Patient not taking: Reported on 05/11/2022) 90 tablet 0   atorvastatin (LIPITOR) 80 MG tablet Take 1 tablet (80 mg total) by mouth once daily. (Patient not taking: Reported on 05/11/2022) 90 tablet 0   clopidogrel (PLAVIX) 75 MG tablet Take 1 tablet (75 mg total) by mouth once daily. (Patient not taking: Reported on 05/11/2022) 90 tablet 0   HYDROcodone-acetaminophen (NORCO/VICODIN) 5-325 MG tablet Take 1 tablet by mouth every 6 (six) hours as needed for moderate pain. (Patient not taking: Reported on 05/11/2022) 15 tablet 0      Home: Home Living Family/patient expects to be discharged to:: Private residence Living Arrangements: Other relatives (brother) Available Help at Discharge: Family, Friend(s), Available PRN/intermittently Type of Home: Apartment Home Access: Stairs to enter Secretary/administrator of Steps: 5 Entrance Stairs-Rails: Can reach both Home Layout: One level Bathroom Shower/Tub: Tub/shower unit, Engineer, building services: Pharmacist, community: Yes Home Equipment: BSC/3in1, Shower seat, Grab bars - tub/shower, Cane - single point, Rollator (4 wheels), Other (comment) Additional Comments: pt reports living in an apartment, notes earlier in admission reports the pt came from a group home  Lives With: Other (Comment), Family (brother)   Functional History: Prior Function Prior Level of Function : Needs assist Mobility  Comments: pt reports he was ambulating with use of a cane. Pt does not drive, friend takes him when transportation is needed ADLs Comments: pt reports independence with ADLs, utilizes shower seat  Functional Status:  Mobility: Bed Mobility Overal bed mobility: Needs Assistance Bed Mobility: Supine to Sit Supine to sit: Min guard, HOB elevated General bed mobility  comments: +rail, increased time Transfers Overall transfer level: Needs assistance Equipment used: Rolling walker (2 wheels) Transfers: Sit to/from Stand Sit to Stand: +2 safety/equipment, Min assist Bed to/from chair/wheelchair/BSC transfer type:: Step pivot Step pivot transfers: Min guard General transfer comment: cues for hand placement and sequencing Ambulation/Gait Ambulation/Gait assistance: +2 safety/equipment, Min assist Gait Distance (Feet): 175 Feet Assistive device: Rolling walker (2 wheels) Gait Pattern/deviations: Step-through pattern, Decreased stride length, Trunk flexed General Gait Details: assist with turns, maneuvering around obstacles, and to maintain balance. Cues for posture Gait velocity interpretation: <1.31 ft/sec, indicative of household ambulator    ADL: ADL Overall ADL's : Needs assistance/impaired Eating/Feeding: Sitting, Minimal assistance Grooming: Sitting, Minimal assistance Upper Body Bathing: Sitting, Minimal assistance Lower Body Bathing: Moderate assistance Lower Body Bathing Details (indicate cue type and reason): min A +2 sit<>stand Upper Body Dressing : Moderate assistance, Sitting Lower Body Dressing: Total assistance Lower Body Dressing Details (indicate cue type and reason): min A +2 sit<>stand Toilet Transfer: Min guard, Stand-pivot, +2 for safety/equipment Toilet Transfer Details (indicate cue type and reason): Pt comes up to stand and stays flexed at hips ~90 degrees to turn from bed to recliner Toileting- Clothing Manipulation and Hygiene: Total assistance Toileting - Clothing Manipulation Details (indicate cue type and reason): min A +2 sit<>stand  Cognition: Cognition Overall Cognitive Status: Impaired/Different from baseline Arousal/Alertness: Awake/alert Orientation Level: Oriented X4 Year: 2023 Day of Week: Incorrect (off by 1 day) Attention: Sustained Sustained Attention: Appears intact Memory: Impaired Memory Impairment:  Retrieval deficit Awareness: Appears intact (need to assess further) Problem Solving: Impaired Problem Solving Impairment: Functional basic Safety/Judgment: Impaired Cognition Arousal/Alertness: Awake/alert Behavior During Therapy: Impulsive Overall Cognitive Status: Impaired/Different from baseline Area of Impairment: Orientation, Following commands, Safety/judgement, Awareness, Problem solving Orientation Level: Situation Following Commands: Follows one step commands consistently, Follows one step commands with increased time Safety/Judgement: Decreased awareness of safety, Decreased awareness of deficits Awareness: Emergent Problem Solving: Slow processing, Difficulty sequencing, Requires verbal cues, Requires tactile cues  Physical Exam: Blood pressure (!) 130/104, pulse 81, temperature 98.1 F (36.7 C), temperature source Axillary, resp. rate 12, SpO2 93 %. Physical Exam  No results found for this or any previous visit (from the past 48 hour(s)). No results found.    Blood pressure (!) 130/104, pulse 81, temperature 98.1 F (36.7 C), temperature source Axillary, resp. rate 12, SpO2 93 %.  Medical Problem List and Plan: 1. Functional deficits secondary to ***  -patient may *** shower  -ELOS/Goals: *** 2.  Antithrombotics: -DVT/anticoagulation:  {VTE PROPHYLAXIS/ANTICOAGULATION - NIOE:70350}  -antiplatelet therapy: *** 3. Pain Management: *** 4. Mood: ***  -antipsychotic agents: *** 5. Neuropsych: This patient *** capable of making decisions on *** own behalf. 6. Skin/Wound Care: *** 7. Fluids/Electrolytes/Nutrition: ***     ***  Jacquelynn Cree, PA-C 05/13/2022

## 2022-05-13 NOTE — Progress Notes (Signed)
Physical Therapy Treatment Patient Details Name: James Lambert MRN: [id number] DOB: [birth year] Today's Date: 05/13/2022   History of Present Illness 56 y.o. male presents to [facility] hospital 05/10/2022 with AMS and aphasia. Of note pt with 2 admissions in April 2023 with CVAs. CTA demonstrates L ICA occlusion. Pt received TNK and underwent thrombectomy on 6/3. PMH includes asthma, COPD, HTN, CVA.    PT Comments    Pt received in bed requesting assist to bathroom for BM. Pt assisted in/out of bathroom then ambulated in hallway. He required min assist bed mobility, min assist +2 safety transfers, and min assist +2 safety ambulation 175' with RW. Mild inattention to R side. Remains impulsive but improved safety awareness this session. Continue to recommend AIR at d/c. Pt in recliner with feet elevated at end of session.    Recommendations for follow up therapy are one component of a multi-disciplinary discharge planning process, led by the attending physician.  Recommendations may be updated based on patient status, additional functional criteria and insurance authorization.  Follow Up Recommendations  Acute inpatient rehab (3hours/day)     Assistance Recommended at Discharge Frequent or constant Supervision/Assistance  Patient can return home with the following A lot of help with bathing/dressing/bathroom;Assistance with cooking/housework;Direct supervision/assist for medications management;Direct supervision/assist for financial management;Assist for transportation;Help with stairs or ramp for entrance;A little help with walking and/or transfers   Equipment Recommendations  Other (comment) (TBD)    Recommendations for Other Services       Precautions / Restrictions Precautions Precautions: Fall     Mobility  Bed Mobility Overal bed mobility: Needs Assistance Bed Mobility: Supine to Sit     Supine to sit: Min guard, HOB elevated     General bed mobility comments: +rail, increased  time    Transfers Overall transfer level: Needs assistance Equipment used: Rolling walker (2 wheels) Transfers: Sit to/from Stand Sit to Stand: +2 safety/equipment, Min assist           General transfer comment: cues for hand placement and sequencing    Ambulation/Gait Ambulation/Gait assistance: +2 safety/equipment, Min assist Gait Distance (Feet): 175 Feet Assistive device: Rolling walker (2 wheels) Gait Pattern/deviations: Step-through pattern, Decreased stride length, Trunk flexed   Gait velocity interpretation: <1.31 ft/sec, indicative of household ambulator   General Gait Details: assist with turns, maneuvering around obstacles, and to maintain balance. Cues for posture   Stairs             Wheelchair Mobility    Modified Rankin (Stroke Patients Only) Modified Rankin (Stroke Patients Only) Pre-Morbid Rankin Score: Moderate disability Modified Rankin: Moderately severe disability     Balance Overall balance assessment: Needs assistance Sitting-balance support: No upper extremity supported, Feet supported Sitting balance-Leahy Scale: Good     Standing balance support: Bilateral upper extremity supported, During functional activity, Reliant on assistive device for balance Standing balance-Leahy Scale: Poor                              Cognition Arousal/Alertness: Awake/alert Behavior During Therapy: Impulsive Overall Cognitive Status: Impaired/Different from baseline Area of Impairment: Orientation, Following commands, Safety/judgement, Awareness, Problem solving                 Orientation Level: Situation     Following Commands: Follows one step commands consistently, Follows one step commands with increased time Safety/Judgement: Decreased awareness of safety, Decreased awareness of deficits Awareness: Emergent Problem Solving: Slow processing, Difficulty sequencing,  Requires verbal cues, Requires tactile cues           Exercises      General Comments        Pertinent Vitals/Pain Pain Assessment Pain Assessment: No/denies pain    Home Living                          Prior Function            PT Goals (current goals can now be found in the care plan section) Acute Rehab PT Goals Patient Stated Goal: to return to prior level of function Progress towards PT goals: Progressing toward goals    Frequency    Min 4X/week      PT Plan Current plan remains appropriate    Co-evaluation              AM-PAC PT "6 Clicks" Mobility   Outcome Measure  Help needed turning from your back to your side while in a flat bed without using bedrails?: A Little Help needed moving from lying on your back to sitting on the side of a flat bed without using bedrails?: A Little Help needed moving to and from a bed to a chair (including a wheelchair)?: A Little Help needed standing up from a chair using your arms (e.g., wheelchair or bedside chair)?: A Little Help needed to walk in hospital room?: Total Help needed climbing 3-5 steps with a railing? : Total 6 Click Score: 14    End of Session Equipment Utilized During Treatment: Gait belt Activity Tolerance: Patient tolerated treatment well Patient left: in chair;with call bell/phone within reach;with chair alarm set Nurse Communication: Mobility status PT Visit Diagnosis: Unsteadiness on feet (R26.81);Other abnormalities of gait and mobility (R26.89);Other symptoms and signs involving the nervous system (R29.898)     Time: 5638-7564 PT Time Calculation (min) (ACUTE ONLY): 26 min  Charges:  $Gait Training: 8-22 mins $Therapeutic Activity: 8-22 mins                     Aida Raider, PT  Office # [phone]     Ilda Foil 05/13/2022, 11:24 AM

## 2022-05-13 NOTE — Progress Notes (Signed)
Inpatient Rehab Admissions Coordinator:    I do not have a bed for this Pt. On CIR today. I will continue to follow for potential admit pending medical readiness and bed availability.  Megan Salon, MS, CCC-SLP Rehab Admissions Coordinator  [phone] (celll) [phone] (office)

## 2022-05-13 NOTE — Progress Notes (Addendum)
STROKE TEAM PROGRESS NOTE   INTERVAL HISTORY Patient is seen in his room with no family at the bedside.  Vital signs are stable and his neurological exam is stable.  Will transfer out of the ICU today and plan to discharge to CIR soon.  Patient did participate in the sleep smart study for stroke prevention and underwent NOx 3 monitoring last night was a screen failure due to having too many central sleep apneas Vitals:   05/13/22 1000 05/13/22 1100 05/13/22 1200 05/13/22 1300  BP: (!) 132/98 (!) 142/97 133/79 (!) 141/88  Pulse: 75 68 92 76  Resp: 16 11 20  (!) 21  Temp:   98.4 F (36.9 C)   TempSrc:   Oral   SpO2: 96% 99% 98% 93%   CBC:  Recent Labs  Lab 05/10/22 2006 05/10/22 2328 05/11/22 0249  WBC 7.1  --  9.7  NEUTROABS 3.6  --   --   HGB 11.9* 12.6* 12.8*  HCT 36.3* 37.0* 37.6*  MCV 93.1  --  91.0  PLT 385  --  394    Basic Metabolic Panel:  Recent Labs  Lab 05/10/22 2006 05/10/22 2328 05/11/22 0249  NA 134* 135 131*  K 4.3 3.8 3.8  CL 104  --  101  CO2 20*  --  20*  GLUCOSE 96  --  171*  BUN 10  --  7  CREATININE 0.98  --  1.04  CALCIUM 8.5*  --  8.8*  MG  --   --  1.7  PHOS  --   --  2.5    Lipid Panel:  Recent Labs  Lab 05/11/22 0249  CHOL 140  TRIG 87  HDL 32*  CHOLHDL 4.4  VLDL 17  LDLCALC 91    HgbA1c: No results for input(s): HGBA1C in the last 168 hours. Urine Drug Screen:  Recent Labs  Lab 05/11/22 0405  LABOPIA NONE DETECTED  COCAINSCRNUR NONE DETECTED  LABBENZ NONE DETECTED  AMPHETMU NONE DETECTED  THCU POSITIVE*  LABBARB NONE DETECTED     Alcohol Level  Recent Labs  Lab 05/11/22 0249  ETH <10     IMAGING past 24 hours No results found.  PHYSICAL EXAM  Physical Exam  Constitutional: Appears well-developed and well-nourished middle-aged African-American male.  Cardiovascular: Normal rate and regular rhythm.  Respiratory: Effort normal, non-labored breathing  Neuro: Mental Status: Patient is awake, alert, oriented  to person, place, month, year, and situation. Patient is able to give a clear and coherent history.  No signs of aphasia or neglect.  Speech is fluent with only slight word hesitancy but no paraphasic errors.  Able to name repeat and comprehend well. Cranial Nerves: II: Visual Fields are full. Pupils are equal, round, and reactive to light.   III,IV, VI: EOMI without ptosis or diploplia.  VII: Facial movement is symmetric resting and smiling VIII: Hearing is intact to voice X: Phonation is normal  Motor: Tone is normal. Bulk is normal. 5/5 strength was present in LUE and LLE RUE 4+/5 mild right grip weakness.  Diminished fine finger movements on the right.  LUE 5/5 RLE 4+/5 LLE 5/5 Hand weakness on the right with decreased fine motor movements Sensory: Sensation is symmetric to light touch in the arms and legs.  Gait Slow but steady with walker    ASSESSMENT/PLAN [name] is a 56 y.o. male with history of asthma, copd, hidradenitis suppurativa, and HTN presenting with confusion and aphasia.  CTA demonstrated a left  ICA occlusion distal to the bifurcation. Taken for thrombectomy achieving TICI3 revasc. He does have some residual right side weakness. Consider loop recorder prior to discharge if he does not show afib inpatient. Recently had a stroke work up done for embolic appearing strokes at [facility], he had not yet followed up with cardiology or neurology outpatient. Candidate for sleep smart study but screen failure due to too many central sleep apneas.   Stroke:  Scattered small acute infarcts in the left cerebral hemisphere and right posterior lentiform nucleus secondary to left ICA occlusion s/p IV TNK and successful thrombectomy with TICI3 revascularization Code Stroke CT head No acute abnormality.  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 MRI scattered small acute infarcts in the left cerebral hemisphere, with an additional punctate acute infarct in the right posterior lentiform nucleus.  2D Echo EF 60-65%, no LVH LDL 91 HgbA1c 5.3 VTE prophylaxis - SCDs    Diet   Diet Heart Room service appropriate? Yes with Assist; Fluid consistency: Thin   aspirin 81 mg daily prior to admission, now on aspirin 81 mg and Plavix 75 mg daily.  For 3 weeks followed by Plavix alone Therapy recommendations:  CIR Disposition:  pending  Mechanical thrombectomy of Left ICA with TICI3 revascularization MRI shows scattered infarcts in left hemisphere BP goal 120-140 for 24 hours post procedure  Hypertension Home meds:  none Stable- on cleviprex BP goal 120-140 Long-term BP goal normotensive Start norvasc 10mg   Hyperlipidemia Home meds:  atorvastatin 80mg , resumed in hospital LDL 91, goal < 70 Continue statin at discharge   Other Stroke Risk Factors Cigarette smoker, advised to stop smoking ETOH use, alcohol level <10, advised to drink no more than 2 drink(s) a day On CIWA protocol with ativan Thiamine, folic acid, multi vit ordered Substance abuse - UDS:  THC POSITIVE, Cocaine NONE DETECTED. Patient advised to stop using due to stroke risk. Obesity, There is no height or weight on file to calculate BMI., BMI >/= 30 associated with increased stroke risk, recommend weight loss, diet and exercise as appropriate  Hx stroke/TIA 03/07/2022- full stroke work up done at [facility]- had numbness on the right side  MRI showed multifocal ischemic areas in an embolic distribution  Other Active Problems   Hospital day # 3  Patient seen and examined by NP/APP with MD. MD to update note as needed.   Cortney E 03/09/2022 , MSN, AGACNP-BC [facility] See Amion for schedule and pager information 05/13/2022  1:54 PM  I have personally obtained history,examined this patient, reviewed notes, independently viewed imaging studies, participated in medical decision making and plan of care.ROS completed by me personally and pertinent positives fully documented  I have made any additions or clarifications directly to the above note. Agree with note above.  Plan add Plavix to the aspirin for 3 weeks then Plavix alone and stop aspirin.  Mobilize out of bed.  Therapy consults.  Transfer out of ICU.  Patient did participate in the sleep smart stroke prevention study but unfortunately was a screen failure due to having too many central sleep apneas.  Greater than 50% time during this 50-minute visit was spent in counseling and coordination of care and discussion patient care team and answering  questions  Delia HeadyPramod Kena Limon, MD Medical Director [facility] Pager: [phone] 05/13/2022 3:28 PM   To contact Stroke Continuity provider, please refer to [email or web address]. After hours, contact General Neurology

## 2022-05-14 NOTE — Progress Notes (Signed)
Inpatient Rehab Admissions Coordinator:    I do not have a bed for this Pt. Today on CIR. I will follow for potential admit pending bed availability  Megan Salon, MS, CCC-SLP Rehab Admissions Coordinator  [phone] (celll) [phone] (office)

## 2022-05-14 NOTE — Progress Notes (Signed)
Physical Therapy Treatment Patient Details Name: James Lambert MRN: [id number] DOB: 09-Sep-1966 Today's Date: 05/14/2022   History of Present Illness 56 y.o. male presents to [facility] hospital 05/10/2022 with AMS and aphasia. Of note pt with 2 admissions in April 2023 with CVAs. CTA demonstrates L ICA occlusion. Pt received TNK and underwent thrombectomy on 6/3. PMH includes asthma, COPD, HTN, CVA.    PT Comments    Pt continues to demonstrate deficits in balance, activity tolerance, and cognition that impact his safety and place him at risk for falls/injury. Pt requires > 2 seconds to ambulate 1 foot and is unable to dual-task or avoid obstacles without cues or assistance. Pt educated on his risk for falls and instructed not to get up alone. He verbalized understanding. His R knee stiffness also impacts his gait pattern, thus encouraged pt to perform AROM knee flexion/extension as tolerated,. Will continue to follow acutely. Current recommendations remain appropriate.     Recommendations for follow up therapy are one component of a multi-disciplinary discharge planning process, led by the attending physician.  Recommendations may be updated based on patient status, additional functional criteria and insurance authorization.  Follow Up Recommendations  Acute inpatient rehab (3hours/day)     Assistance Recommended at Discharge Frequent or constant Supervision/Assistance  Patient can return home with the following A lot of help with bathing/dressing/bathroom;Assistance with cooking/housework;Direct supervision/assist for medications management;Direct supervision/assist for financial management;Assist for transportation;Help with stairs or ramp for entrance;A little help with walking and/or transfers   Equipment Recommendations  Rolling walker (2 wheels)    Recommendations for Other Services       Precautions / Restrictions Precautions Precautions: Fall Restrictions Weight Bearing Restrictions:  No     Mobility  Bed Mobility Overal bed mobility: Needs Assistance Bed Mobility: Supine to Sit     Supine to sit: Supervision, HOB elevated     General bed mobility comments: Supervision for safety    Transfers Overall transfer level: Needs assistance Equipment used: Rolling walker (2 wheels) Transfers: Sit to/from Stand Sit to Stand: Min assist           General transfer comment: MinA for safety and stability with transfer to stand from EOB    Ambulation/Gait Ambulation/Gait assistance: Min assist Gait Distance (Feet): 180 Feet Assistive device: Rolling walker (2 wheels) Gait Pattern/deviations: Step-through pattern, Decreased stride length, Trunk flexed, Wide base of support (decreased R knee flexion in swing with R circumduction) Gait velocity: reduced Gait velocity interpretation: <1.31 ft/sec, indicative of household ambulator (>2 sec/ft)   General Gait Details: Pt with flexed posture, needing cues to correct with mod success in carryover. Pt wtih wide stance and circumduction of R leg due to lack of R knee flexion during swing due to R knee pain and pt being fearful of "knee giving out", but no buckling noted. Tactile cues provided to facilitate knee flexion with swing, poor success. Pt having to stop or slow speed significantly to change head positions or count backwards from 50 by 2s. MinA for stability   Stairs             Wheelchair Mobility    Modified Rankin (Stroke Patients Only) Modified Rankin (Stroke Patients Only) Pre-Morbid Rankin Score: Moderate disability Modified Rankin: Moderately severe disability     Balance Overall balance assessment: Needs assistance Sitting-balance support: No upper extremity supported, Feet supported Sitting balance-Leahy Scale: Good Sitting balance - Comments: Reaches off BOS to try to donn socks on feet on ground, no LOB  Standing balance support: No upper extremity supported, Bilateral upper extremity  supported, During functional activity Standing balance-Leahy Scale: Fair Standing balance comment: Able to stand statically without UE support but benefits from RW or minA to prevent LOB                            Cognition Arousal/Alertness: Awake/alert Behavior During Therapy: Impulsive (mildly) Overall Cognitive Status: Impaired/Different from baseline Area of Impairment: Following commands, Safety/judgement, Awareness, Problem solving, Attention                   Current Attention Level: Selective   Following Commands: Follows one step commands consistently, Follows one step commands with increased time Safety/Judgement: Decreased awareness of safety, Decreased awareness of deficits Awareness: Emergent Problem Solving: Slow processing, Difficulty sequencing, Requires verbal cues, Requires tactile cues General Comments: Pt unable to fully explain what is bothering him at times, stating "my elbow" and then when asked to explain further he stated "I just forget about it due to the stroke". Pt reporting he re-arranged his room through walking around holding onto objects last night without assistance, stating he knows he needs to be careful as others in his family have had strokes. Pt aware of obstacles at times, but did not try to avoid it, stating "I thought you would help me". Unable to dual-task, stopping while counting backwards by 2s often, counting "50, 48, 47, 46, mumble, 42".        Exercises General Exercises - Lower Extremity Long Arc Quad: AROM, Right, 10 reps, Seated Heel Slides: AROM, Right, 10 reps, Seated    General Comments General comments (skin integrity, edema, etc.): educated pt on his cog deficits and risk for falls and to not get up alone, he verbalized understanding      Pertinent Vitals/Pain Pain Assessment Pain Assessment: Faces Faces Pain Scale: Hurts little more Pain Location: bil kness Pain Descriptors / Indicators: Other (Comment)  (stiff) Pain Intervention(s): Limited activity within patient's tolerance, Monitored during session, Repositioned    Home Living                          Prior Function            PT Goals (current goals can now be found in the care plan section) Acute Rehab PT Goals Patient Stated Goal: to return to prior level of function PT Goal Formulation: With patient Time For Goal Achievement: 05/25/22 Potential to Achieve Goals: Fair Progress towards PT goals: Progressing toward goals    Frequency    Min 4X/week      PT Plan Equipment recommendations need to be updated    Co-evaluation              AM-PAC PT "6 Clicks" Mobility   Outcome Measure  Help needed turning from your back to your side while in a flat bed without using bedrails?: A Little Help needed moving from lying on your back to sitting on the side of a flat bed without using bedrails?: A Little Help needed moving to and from a bed to a chair (including a wheelchair)?: A Little Help needed standing up from a chair using your arms (e.g., wheelchair or bedside chair)?: A Little Help needed to walk in hospital room?: A Little Help needed climbing 3-5 steps with a railing? : Total 6 Click Score: 16    End of Session Equipment Utilized During Treatment:  Gait belt Activity Tolerance: Patient tolerated treatment well Patient left: in chair;Other (comment) (with OT) Nurse Communication: Mobility status PT Visit Diagnosis: Unsteadiness on feet (R26.81);Other abnormalities of gait and mobility (R26.89);Other symptoms and signs involving the nervous system (R29.898);Difficulty in walking, not elsewhere classified (R26.2)     Time: 6045-40981100-1132 PT Time Calculation (min) (ACUTE ONLY): 32 min  Charges:  $Gait Training: 8-22 mins $Therapeutic Activity: 8-22 mins                     James GurneyAnessa Lambert, PT, DPT Acute Rehabilitation Services  Office: [phone]    James Lambert 05/14/2022, 5:45 PM

## 2022-05-14 NOTE — Progress Notes (Signed)
Speech Language Pathology Treatment: Cognitive-Linquistic  Patient Details Name: James Lambert MRN: [id number] DOB: 02/04/1966 Today's Date: 05/14/2022 Time: 1535-1550 SLP Time Calculation (min) (ACUTE ONLY): 15 min  Assessment / Plan / Recommendation Clinical Impression  Pt was seen for cognitive-linguistic treatment. He was alert and cooperative and reported that he now retrospectively notices impairments in awareness when completing therapy tasks earlier. Pt stated that he was instructed to count backwards in twos, but only subsequently recognized that he was completing the task incorrectly. Per the pt, he would have previously been able to identify this during the task and self-correct. Pt demonstrated 40% accuracy with a mental manipulation (sequencing) task increasing to 100% accuracy with repetition and cues. He completed a medication management (prescription) task with 80% accuracy increasing to 100% with cues. He achieved 100% with problem solving related to safety. Time management problems were attempted, but exhibited notable difficulty with this despite support from SLP. The session was abbreviated due to pt's need to use the urinal after struggling with this task. SLP will continue to follow pt.     HPI HPI: 56 y.o. male presents to [facility] hospital 05/10/2022 with AMS and aphasia. Of note pt with 2 admissions in April 2023 with CVAs. CTA demonstrates L ICA occlusion. Pt received TNK and underwent thrombectomy on 6/3. PMH includes asthma, COPD, HTN, CVA      SLP Plan  Continue with current plan of care      Recommendations for follow up therapy are one component of a multi-disciplinary discharge planning process, led by the attending physician.  Recommendations may be updated based on patient status, additional functional criteria and insurance authorization.    Recommendations                   Follow Up Recommendations: Acute inpatient rehab (3hours/day) Assistance recommended  at discharge: Frequent or constant Supervision/Assistance SLP Visit Diagnosis: Cognitive communication deficit (C16.384) Plan: Continue with current plan of care          Albino Bufford I. Vear Clock, MS, CCC-SLP Acute Rehabilitation Services Office number [phone] Pager [phone]  Scheryl Marten  05/14/2022, 3:58 PM

## 2022-05-14 NOTE — Progress Notes (Addendum)
STROKE TEAM PROGRESS NOTE   INTERVAL HISTORY Patient is seen in his room with no family at the bedside.  Vital signs are stable and his neurological exam is stable.  He is medically stable for discharge to CIR soon.  Patient did participate in the sleep smart study for stroke prevention and underwent NOx 3 monitoring last night was a screen failure due to having too many central sleep apneas.   Ambulatory referral sent for Sleep Medicine to manage his central sleep apnea.   Vitals:   05/13/22 2334 05/14/22 0345 05/14/22 0851 05/14/22 1232  BP: 137/86 (!) 147/97 (!) 159/103 (!) 155/93  Pulse: 77 87 75 69  Resp: 14 16  18   Temp: 98.4 F (36.9 C) 98.4 F (36.9 C) 98.5 F (36.9 C) 97.9 F (36.6 C)  TempSrc: Oral Oral Oral Oral  SpO2: 98% 98% 100% 99%   CBC:  Recent Labs  Lab 05/10/22 2006 05/10/22 2328 05/11/22 0249  WBC 7.1  --  9.7  NEUTROABS 3.6  --   --   HGB 11.9* 12.6* 12.8*  HCT 36.3* 37.0* 37.6*  MCV 93.1  --  91.0  PLT 385  --  394   Basic Metabolic Panel:  Recent Labs  Lab 05/10/22 2006 05/10/22 2328 05/11/22 0249  NA 134* 135 131*  K 4.3 3.8 3.8  CL 104  --  101  CO2 20*  --  20*  GLUCOSE 96  --  171*  BUN 10  --  7  CREATININE 0.98  --  1.04  CALCIUM 8.5*  --  8.8*  MG  --   --  1.7  PHOS  --   --  2.5   Lipid Panel:  Recent Labs  Lab 05/11/22 0249  CHOL 140  TRIG 87  HDL 32*  CHOLHDL 4.4  VLDL 17  LDLCALC 91   HgbA1c: No results for input(s): HGBA1C in the last 168 hours. Urine Drug Screen:  Recent Labs  Lab 05/11/22 0405  LABOPIA NONE DETECTED  COCAINSCRNUR NONE DETECTED  LABBENZ NONE DETECTED  AMPHETMU NONE DETECTED  THCU POSITIVE*  LABBARB NONE DETECTED    Alcohol Level  Recent Labs  Lab 05/11/22 0249  ETH <10    IMAGING past 24 hours No results found.  PHYSICAL EXAM  Physical Exam  Constitutional: Appears well-developed and well-nourished middle-aged African-American male.  Cardiovascular: Normal rate and regular  rhythm.  Respiratory: Effort normal, non-labored breathing  Neuro: Mental Status: Patient is awake, alert, oriented to person, place, month, year, and situation. Patient is able to give a clear and coherent history.  No signs of aphasia or neglect.  Speech is fluent with only slight word hesitancy but no paraphasic errors.  Able to name repeat and comprehend well. Cranial Nerves: II: Visual Fields are full. Pupils are equal, round, and reactive to light.   III,IV, VI: EOMI without ptosis or diploplia.  VII: Facial movement is symmetric resting and smiling VIII: Hearing is intact to voice X: Phonation is normal  Motor: Tone is normal. Bulk is normal. 5/5 strength was present in LUE and LLE RUE 4+/5 mild right grip weakness.  Diminished fine finger movements on the right.  LUE 5/5 RLE 4+/5 LLE 5/5 Hand weakness on the right with decreased fine motor movements Sensory: Sensation is symmetric to light touch in the arms and legs.  Gait Slow but steady with walker    ASSESSMENT/PLAN [name] is a 56 y.o. male with history of asthma, copd,  hidradenitis suppurativa, and HTN presenting with confusion and aphasia.  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 Taken for thrombectomy achieving TICI3 revasc. He does have some residual right side weakness. Consider loop recorder prior to discharge if he does not show afib inpatient. Recently had a stroke work up done for embolic appearing strokes at [facility], he had not yet followed up with cardiology or neurology outpatient. Candidate for sleep smart study but screen failure due to too many central sleep apneas.   Stroke:  Scattered small acute infarcts in the left cerebral hemisphere and right posterior lentiform nucleus secondary to left ICA occlusion s/p IV TNK and successful thrombectomy with TICI3 revascularization Code Stroke CT head No acute abnormality.  CTA head & neck Abrupt occlusion of the proximal cervical  left ICA just distal to the bifurcation CT perfusion 4 cc acute core infarct at the anterior left frontal lobe, with large surrounding ischemic penumbra, mismatch volume measures 236 mL Post IR CT No evidence of thromboembolic or hemorrhagic complication. MRI scattered small acute infarcts in the left cerebral hemisphere, with an additional punctate acute infarct in the right posterior lentiform nucleus.  2D Echo EF 60-65%, no LVH LDL 91 HgbA1c 5.3 VTE prophylaxis - SCDs    Diet   Diet Heart Room service appropriate? Yes with Assist; Fluid consistency: Thin   aspirin 81 mg daily prior to admission, now on aspirin 81 mg and Plavix 75 mg daily.  For 3 weeks followed by Plavix alone Therapy recommendations:  CIR Disposition:  pending  Mechanical thrombectomy of Left ICA with TICI3 revascularization MRI shows scattered infarcts in left hemisphere BP goal 120-140 for 24 hours post procedure  Hypertension Home meds:  none Stable- on cleviprex BP goal 120-140 Long-term BP goal normotensive Start norvasc 10mg   Hyperlipidemia Home meds:  atorvastatin 80mg , resumed in hospital LDL 91, goal < 70 Continue statin at discharge   Other Stroke Risk Factors Cigarette smoker, advised to stop smoking ETOH use, alcohol level <10, advised to drink no more than 2 drink(s) a day On CIWA protocol with ativan Thiamine, folic acid, multi vit ordered Substance abuse - UDS:  THC POSITIVE, Cocaine NONE DETECTED. Patient advised to stop using due to stroke risk. Obesity, There is no height or weight on file to calculate BMI., BMI >/= 30 associated with increased stroke risk, recommend weight loss, diet and exercise as appropriate  Hx stroke/TIA 03/07/2022- full stroke work up done at [facility]- had numbness on the right side  MRI showed multifocal ischemic areas in an embolic distribution  Other Active Problems   Hospital day # 4  Patient seen and examined by NP/APP with MD. MD to update note as needed.    Continue Plavix and  aspirin for 3 weeks then Plavix alone and stop aspirin.  Mobilize out of bed.  Continue ongoing therapies.  Transfer to inpatient rehab if bed available.  Patient did participate in the sleep smart stroke prevention study but unfortunately was a screen failure due to having too many central sleep apneas.  Patient has been referred to sleep clinic for outpatient management of central sleep apneas.  Greater than 50% time during this 35 minute visit was spent in counseling and coordination of care and discussion patient care team and answering questions  03/09/2022, MD Medical Director [facility] Stroke Center Pager: [phone] 05/14/2022 12:44 PM   To contact Stroke Continuity provider, please refer to [phone]. After hours, contact General Neurology

## 2022-05-14 NOTE — Progress Notes (Signed)
Occupational Therapy Treatment Patient Details Name: James Lambert MRN: [id number] DOB: 10/15/1966 Today's Date: 05/14/2022   History of present illness 56 y.o. male presents to [facility] hospital 05/10/2022 with AMS and aphasia. Of note pt with 2 admissions in April 2023 with CVAs. CTA demonstrates L ICA occlusion. Pt received TNK and underwent thrombectomy on 6/3. PMH includes asthma, COPD, HTN, CVA.   OT comments  Pt progressing towards goals, completing 3 step trailmaking task in room; pt able to remember steps and complete in room without verbal cues. Pt with R inattention, running into objects on R side x2 during session. Pt overall requiring min guard-min A for transfers with RW, completes standing grooming task and LB bathing with min guard A. Pt presenting with impairments listed below, will follow acutely. Continue to recommend AIR at d/c.   Recommendations for follow up therapy are one component of a multi-disciplinary discharge planning process, led by the attending physician.  Recommendations may be updated based on patient status, additional functional criteria and insurance authorization.    Follow Up Recommendations  Acute inpatient rehab (3hours/day)    Assistance Recommended at Discharge Frequent or constant Supervision/Assistance  Patient can return home with the following  A lot of help with walking and/or transfers;A lot of help with bathing/dressing/bathroom;Assistance with cooking/housework;Assistance with feeding;Assist for transportation;Direct supervision/assist for financial management;Direct supervision/assist for medications management   Equipment Recommendations  None recommended by OT;Other (comment) (defer to next venue of care)    Recommendations for Other Services Rehab consult    Precautions / Restrictions Precautions Precautions: Fall Restrictions Weight Bearing Restrictions: No       Mobility Bed Mobility               General bed mobility  comments: OOB in chair upon arrival    Transfers Overall transfer level: Needs assistance Equipment used: Rolling walker (2 wheels) Transfers: Sit to/from Stand Sit to Stand: Min assist     Step pivot transfers: Min assist     General transfer comment: cues for hand placement/safety, runs into objects on R side x2 during session     Balance Overall balance assessment: Needs assistance Sitting-balance support: No upper extremity supported, Feet supported Sitting balance-Leahy Scale: Good Sitting balance - Comments: can reach outside BOS without LOB   Standing balance support: Bilateral upper extremity supported, During functional activity, Reliant on assistive device for balance Standing balance-Leahy Scale: Poor                             ADL either performed or assessed with clinical judgement   ADL Overall ADL's : Needs assistance/impaired     Grooming: Wash/dry hands;Standing;Min guard;Minimal assistance Grooming Details (indicate cue type and reason): standing at sink with RW     Lower Body Bathing: Min guard;Sitting/lateral leans;Sit to/from stand;Minimal assistance Lower Body Bathing Details (indicate cue type and reason): simulated putting lotion on legs         Toilet Transfer: Min guard;Rolling walker (2 wheels);Ambulation;Regular Toilet;Minimal assistance Toilet Transfer Details (indicate cue type and reason): simulated in room         Functional mobility during ADLs: Min guard;Minimal assistance;Rolling walker (2 wheels)      Extremity/Trunk Assessment Upper Extremity Assessment Upper Extremity Assessment: RUE deficits/detail RUE Deficits / Details: sore, AROM WFL RUE Coordination: decreased fine motor (increased time for opposition)   Lower Extremity Assessment Lower Extremity Assessment: Defer to PT evaluation  Vision   Vision Assessment?: Vision impaired- to be further tested in functional context Additional Comments: R  inattention, poor vision noted from previous CVA   Perception Perception Perception: Not tested   Praxis Praxis Praxis: Not tested    Cognition Arousal/Alertness: Awake/alert Behavior During Therapy: Impulsive Overall Cognitive Status: Impaired/Different from baseline Area of Impairment: Orientation, Following commands, Safety/judgement, Awareness, Problem solving                 Orientation Level: Situation     Following Commands: Follows multi-step commands with increased time, Follows one step commands with increased time Safety/Judgement: Decreased awareness of safety, Decreased awareness of deficits Awareness: Emergent Problem Solving: Slow processing, Difficulty sequencing, Requires verbal cues, Requires tactile cues          Exercises      Shoulder Instructions       General Comments VSS on RA    Pertinent Vitals/ Pain       Pain Assessment Pain Assessment: Faces Pain Score: 3  Pain Location: bil knees and RUE Pain Descriptors / Indicators: Aching, Sore Pain Intervention(s): Limited activity within patient's tolerance, Monitored during session, RN gave pain meds during session  Home Living                                          Prior Functioning/Environment              Frequency  Min 2X/week        Progress Toward Goals  OT Goals(current goals can now be found in the care plan section)  Progress towards OT goals: Progressing toward goals  Acute Rehab OT Goals Patient Stated Goal: to go home OT Goal Formulation: With patient Time For Goal Achievement: 05/25/22 Potential to Achieve Goals: Good ADL Goals Pt Will Perform Grooming: standing;with supervision Pt Will Perform Upper Body Bathing: sitting;standing;with supervision Pt Will Perform Lower Body Bathing: sit to/from stand;with supervision Pt Will Perform Upper Body Dressing: sitting;with supervision Pt Will Perform Lower Body Dressing: sit to/from stand;with  supervision Pt Will Transfer to Toilet: ambulating;bedside commode;with supervision Pt Will Perform Toileting - Clothing Manipulation and hygiene: with supervision;sit to/from stand Additional ADL Goal #1: Pt will be S in and OOB for basic ADLs Additional ADL Goal #2: Continue to assess vision functionally  Plan Discharge plan remains appropriate;Frequency remains appropriate    Co-evaluation                 AM-PAC OT "6 Clicks" Daily Activity     Outcome Measure   Help from another person eating meals?: None Help from another person taking care of personal grooming?: A Little Help from another person toileting, which includes using toliet, bedpan, or urinal?: A Little Help from another person bathing (including washing, rinsing, drying)?: A Lot Help from another person to put on and taking off regular upper body clothing?: A Lot Help from another person to put on and taking off regular lower body clothing?: A Lot 6 Click Score: 16    End of Session Equipment Utilized During Treatment: Gait belt;Rolling walker (2 wheels)  OT Visit Diagnosis: Unsteadiness on feet (R26.81);Other abnormalities of gait and mobility (R26.89);Muscle weakness (generalized) (M62.81);Other symptoms and signs involving cognitive function;Hemiplegia and hemiparesis;Low vision, both eyes (H54.2) Hemiplegia - Right/Left: Right Hemiplegia - dominant/non-dominant: Dominant Hemiplegia - caused by: Cerebral infarction   Activity Tolerance Patient tolerated treatment well  Patient Left in chair;with call bell/phone within reach;with chair alarm set   Nurse Communication Mobility status        Time: [id number] OT Time Calculation (min): 21 min  Charges: OT General Charges $OT Visit: 1 Visit OT Treatments $Self Care/Home Management : 8-22 mins  Alfonzo BeersNatalie Fredie Majano, OTD, OTR/L Acute Rehab [phone]   Mayer Maskeratalie M Reneka Nebergall 05/14/2022, 1:51 PM

## 2022-05-15 MED ORDER — BACLOFEN 10 MG PO TABS
5.0000 mg | ORAL_TABLET | Freq: Three times a day (TID) | ORAL | Status: DC
  Administered 2022-05-15 – 2022-05-16 (×4): 5 mg via ORAL
  Filled 2022-05-15 (×4): qty 1

## 2022-05-15 NOTE — Progress Notes (Signed)
Speech Language Pathology Treatment: Cognitive-Linquistic  Patient Details Name: James Lambert MRN: [id number] DOB: September 12, 1966 Today's Date: 05/15/2022 Time: 9326-7124 SLP Time Calculation (min) (ACUTE ONLY): 15 min  Assessment / Plan / Recommendation Clinical Impression  Pt was seen for treatment. He was alert and cooperative during the session and he reported that he has noticed improvement in attention today. He stated that he was better able to focus on conversations today while completing other tasks. Pt demonstrated recall of concrete information from recorded voice mails with 50% accuracy increasing to 67% with cues. Pt was educated on use of internal memory aids. Following education and his reported used of rehearsal, pt's accuracy improved to 80% with the same task. He completed a mental flexibility (3-item sort) task with 80% accuracy increasing to 100% with repetition and cues. Pt requested that additional tasks be deferred since he believes tasks become more difficult when there are multiple varying tasks since his brain "is still bouncing from the different tasks". It was agreed that SLP will focus on fewer tasks with increased repetitions during each session. SLP will continue to follow pt.    HPI HPI: 56 y.o. male presents to [facility] hospital 05/10/2022 with AMS and aphasia. Of note pt with 2 admissions in April 2023 with CVAs. CTA demonstrates L ICA occlusion. Pt received TNK and underwent thrombectomy on 6/3. PMH includes asthma, COPD, HTN, CVA      SLP Plan  Continue with current plan of care      Recommendations for follow up therapy are one component of a multi-disciplinary discharge planning process, led by the attending physician.  Recommendations may be updated based on patient status, additional functional criteria and insurance authorization.    Recommendations                   Follow Up Recommendations: Acute inpatient rehab (3hours/day) Assistance recommended at  discharge: Frequent or constant Supervision/Assistance SLP Visit Diagnosis: Cognitive communication deficit (P80.998) Plan: Continue with current plan of care         Nicholes Hibler I. Vear Clock, MS, CCC-SLP Acute Rehabilitation Services Office number [phone] Pager [phone]   Scheryl Marten  05/15/2022, 4:11 PM

## 2022-05-15 NOTE — Progress Notes (Addendum)
STROKE TEAM PROGRESS NOTE   INTERVAL HISTORY Patient is seen in his room with no family at the bedside.  Vital signs are stable and his neurological exam is stable.  He complains of increased stiffness and pain in his leg.  I am to start low-dose baclofen. Still awaiting bed in CIR.  Vitals:   05/14/22 2323 05/15/22 0325 05/15/22 0806 05/15/22 0806  BP: (!) 155/98 137/88 (!) 163/99 (!) 163/99  Pulse: 75 67 70 70  Resp: 16 17 20 20   Temp: 98.4 F (36.9 C) 98.1 F (36.7 C) 98.1 F (36.7 C) 98.1 F (36.7 C)  TempSrc: Oral Oral Oral Oral  SpO2: 94% 96% 100% 100%   CBC:  Recent Labs  Lab 05/10/22 2006 05/10/22 2328 05/11/22 0249  WBC 7.1  --  9.7  NEUTROABS 3.6  --   --   HGB 11.9* 12.6* 12.8*  HCT 36.3* 37.0* 37.6*  MCV 93.1  --  91.0  PLT 385  --  394    Basic Metabolic Panel:  Recent Labs  Lab 05/10/22 2006 05/10/22 2328 05/11/22 0249  NA 134* 135 131*  K 4.3 3.8 3.8  CL 104  --  101  CO2 20*  --  20*  GLUCOSE 96  --  171*  BUN 10  --  7  CREATININE 0.98  --  1.04  CALCIUM 8.5*  --  8.8*  MG  --   --  1.7  PHOS  --   --  2.5    Lipid Panel:  Recent Labs  Lab 05/11/22 0249  CHOL 140  TRIG 87  HDL 32*  CHOLHDL 4.4  VLDL 17  LDLCALC 91    HgbA1c: No results for input(s): "HGBA1C" in the last 168 hours. Urine Drug Screen:  Recent Labs  Lab 05/11/22 0405  LABOPIA NONE DETECTED  COCAINSCRNUR NONE DETECTED  LABBENZ NONE DETECTED  AMPHETMU NONE DETECTED  THCU POSITIVE*  LABBARB NONE DETECTED     Alcohol Level  Recent Labs  Lab 05/11/22 0249  ETH <10     IMAGING past 24 hours No results found.  PHYSICAL EXAM  Physical Exam  Constitutional: Appears well-developed and well-nourished middle-aged African-American male.  Cardiovascular: Normal rate and regular rhythm.  Respiratory: Effort normal, non-labored breathing  Neuro: Mental Status: Patient is awake, alert, oriented to person, place, month, year, and situation. Patient is able to  give a clear and coherent history.  No signs of aphasia or neglect.  Speech is fluent with only slight word hesitancy but no paraphasic errors.  Able to name repeat and comprehend well. Cranial Nerves: II: Visual Fields are full. Pupils are equal, round, and reactive to light.   III,IV, VI: EOMI without ptosis or diploplia.  VII: Facial movement is symmetric resting and smiling VIII: Hearing is intact to voice X: Phonation is normal  Motor: Tone is normal. Bulk is normal. 5/5 strength was present in LUE and LLE RUE 4+/5 mild right grip weakness.  Diminished fine finger movements on the right.  LUE 5/5 RLE 4+/5 LLE 5/5 Hand weakness on the right with decreased fine motor movements Sensory: Sensation is symmetric to light touch in the arms and legs.  Gait Slow but steady with walker    ASSESSMENT/PLAN James Lambert is a 56 y.o. male with history of asthma, copd, hidradenitis suppurativa, and HTN presenting with confusion and aphasia.  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 Taken for thrombectomy achieving TICI3 revasc. He does  have some residual right side weakness. Consider loop recorder prior to discharge if he does not show afib inpatient. Recently had a stroke work up done for embolic appearing strokes at [facility], he had not yet followed up with cardiology or neurology outpatient. Candidate for sleep smart study but screen failure due to too many central sleep apneas.   Stroke:  Scattered small acute infarcts in the left cerebral hemisphere and right posterior lentiform nucleus secondary to left ICA occlusion s/p IV TNK and successful thrombectomy with TICI3 revascularization Code Stroke CT head No acute abnormality.  CTA head & neck Abrupt occlusion of the proximal cervical left ICA just distal to the bifurcation CT perfusion 4 cc acute core infarct at the anterior left frontal lobe, with large surrounding ischemic penumbra, mismatch volume measures 236  mL Post IR CT No evidence of thromboembolic or hemorrhagic complication. MRI scattered small acute infarcts in the left cerebral hemisphere, with an additional punctate acute infarct in the right posterior lentiform nucleus.  2D Echo EF 60-65%, no LVH LDL 91 HgbA1c 5.3 VTE prophylaxis - SCDs    Diet   Diet Heart Room service appropriate? Yes with Assist; Fluid consistency: Thin   aspirin 81 mg daily prior to admission, now on aspirin 81 mg and Plavix 75 mg daily.  For 3 weeks followed by Plavix alone Therapy recommendations:  CIR Disposition:  pending  Mechanical thrombectomy of Left ICA with TICI3 revascularization MRI shows scattered infarcts in left hemisphere BP goal 120-140 for 24 hours post procedure  Hypertension Home meds:  none Stable- on cleviprex BP goal 120-140 Long-term BP goal normotensive Start norvasc 10mg   Hyperlipidemia Home meds:  atorvastatin 80mg , resumed in hospital LDL 91, goal < 70 Continue statin at discharge   Other Stroke Risk Factors Cigarette smoker, advised to stop smoking ETOH use, alcohol level <10, advised to drink no more than 2 drink(s) a day On CIWA protocol with ativan Thiamine, folic acid, multi vit ordered Substance abuse - UDS:  THC POSITIVE, Cocaine NONE DETECTED. Patient advised to stop using due to stroke risk. Obesity, There is no height or weight on file to calculate BMI., BMI >/= 30 associated with increased stroke risk, recommend weight loss, diet and exercise as appropriate  Hx stroke/TIA 03/07/2022- full stroke work up done at [facility]- had numbness on the right side  MRI showed multifocal ischemic areas in an embolic distribution  Other Active Problems   Hospital day # 5  Patient seen and examined by NP/APP with MD. MD to update note as needed.   03/09/2022, DNP, FNP-BC [facility] Pager: [phone]  I have personally obtained history,examined this patient, reviewed notes, independently viewed imaging  studies, participated in medical decision making and plan of care.ROS completed by me personally and pertinent positives fully documented  I have made any additions or clarifications directly to the above note. Agree with note above.  Patient complaining of pain and stiffness in legs.  Plan to start baclofen 5 mg 3 times daily.  Continue ongoing therapies.  Transfer to inpatient rehab when bed available.  Medically stable for transfer.  Discussed with rehab coordinator and physician.  Greater than 50% time during this 35 minutes with the spent counseling and coordination of care about his stroke and rehabilitation needs some questions and patient  Elmer Picker, MD Medical Director [phone] Stroke Center Pager: [phone] 05/15/2022 1:04 PM   To contact Stroke Continuity provider, please refer to [phone]. After hours, contact General Neurology

## 2022-05-15 NOTE — Progress Notes (Signed)
Physical Therapy Treatment Patient Details Name: James Lambert MRN: [id number] DOB: [birth year] Today's Date: 05/15/2022   History of Present Illness 56 y.o. male [facility] with AMS and aphasia. Of note pt with 2 admissions in April 2023 with CVAs. CTA demonstrates L ICA occlusion. Pt received TNK and underwent thrombectomy on 6/3. PMH includes asthma, COPD, HTN, CVA.    PT Comments    Pt progressing steadily toward goals.  Slowly improving with attention.  Emphasis on transitions, sit to stand safety and progression of gait stability/quality    Recommendations for follow up therapy are one component of a multi-disciplinary discharge planning process, led by the attending physician.  Recommendations may be updated based on patient status, additional functional criteria and insurance authorization.  Follow Up Recommendations  Acute inpatient rehab (3hours/day)     Assistance Recommended at Discharge Frequent or constant Supervision/Assistance  Patient can return home with the following A little help with walking and/or transfers;A little help with bathing/dressing/bathroom;Assistance with cooking/housework;Assist for transportation;Help with stairs or ramp for entrance   Equipment Recommendations  Rolling walker (2 wheels)    Recommendations for Other Services Rehab consult     Precautions / Restrictions Precautions Precautions: Fall     Mobility  Bed Mobility Overal bed mobility: Needs Assistance Bed Mobility: Rolling, Sidelying to Sit Rolling: Supervision Sidelying to sit: Supervision            Transfers Overall transfer level: Needs assistance Equipment used: Rolling walker (2 wheels) Transfers: Sit to/from Stand Sit to Stand: Min assist           General transfer comment: due to painful legs    Ambulation/Gait Ambulation/Gait assistance: Min assist, Min guard Gait Distance (Feet): 280 Feet Assistive device: Rolling walker (2  wheels) Gait Pattern/deviations: Step-through pattern, Decreased stride length, Trunk flexed, Wide base of support   Gait velocity interpretation: <1.8 ft/sec, indicate of risk for recurrent falls   General Gait Details: Worked on correcting posture, gait step length and speed.  Pt still mildly antalgic, stooped and slower than age appropriate.   Stairs             Wheelchair Mobility    Modified Rankin (Stroke Patients Only) Modified Rankin (Stroke Patients Only) Modified Rankin: Moderately severe disability     Balance     Sitting balance-Leahy Scale: Good       Standing balance-Leahy Scale: Fair Standing balance comment: Able to stand statically without UE support but benefits from RW or minA to prevent LOB                            Cognition Arousal/Alertness: Awake/alert Behavior During Therapy: WFL for tasks assessed/performed Overall Cognitive Status:  (NT formally)                     Current Attention Level: Selective (divided attention limited.)   Following Commands: Follows one step commands consistently   Awareness: Emergent            Exercises      General Comments General comments (skin integrity, edema, etc.): Improved ability to alternate and even divide attension to simple task.      Pertinent Vitals/Pain Pain Assessment Pain Assessment: Faces Faces Pain Scale: Hurts little more Pain Location: bil kness  R>L LE and distal to feet Pain Descriptors / Indicators: Sore Pain Intervention(s): Monitored during session    Home Living  Prior Function            PT Goals (current goals can now be found in the care plan section) Acute Rehab PT Goals Patient Stated Goal: to return to prior level of function PT Goal Formulation: With patient Time For Goal Achievement: 05/25/22 Potential to Achieve Goals: Good Progress towards PT goals: Progressing toward goals     Frequency    Min 4X/week      PT Plan Equipment recommendations need to be updated    Co-evaluation              AM-PAC PT "6 Clicks" Mobility   Outcome Measure  Help needed turning from your back to your side while in a flat bed without using bedrails?: A Little Help needed moving from lying on your back to sitting on the side of a flat bed without using bedrails?: A Little Help needed moving to and from a bed to a chair (including a wheelchair)?: A Little Help needed standing up from a chair using your arms (e.g., wheelchair or bedside chair)?: A Little Help needed to walk in hospital room?: A Little Help needed climbing 3-5 steps with a railing? : A Lot 6 Click Score: 17    End of Session   Activity Tolerance: Patient tolerated treatment well Patient left: in bed;with call bell/phone within reach;with bed alarm set Nurse Communication: Mobility status PT Visit Diagnosis: Other abnormalities of gait and mobility (R26.89)     Time: 1937-9024 PT Time Calculation (min) (ACUTE ONLY): 21 min  Charges:  $Gait Training: 8-22 mins                     05/15/2022  Jacinto Halim., PT Acute Rehabilitation Services [phone]  (pager) [phone]  (office)   James Lambert 05/15/2022, 5:58 PM

## 2022-05-16 ENCOUNTER — Inpatient Hospital Stay (HOSPITAL_COMMUNITY)
Admission: RE | Admit: 2022-05-16 | Discharge: 2022-05-27 | DRG: 057 | Disposition: A | Discharge status: Home Health | Payer: Medicaid Other | Source: Intra-hospital | Attending: Physical Medicine and Rehabilitation | Admitting: Physical Medicine and Rehabilitation

## 2022-05-16 ENCOUNTER — Encounter (HOSPITAL_COMMUNITY): Payer: Self-pay | Admitting: Physical Medicine and Rehabilitation

## 2022-05-16 ENCOUNTER — Other Ambulatory Visit: Payer: Self-pay

## 2022-05-16 DIAGNOSIS — Z597 Insufficient social insurance and welfare support: Secondary | ICD-10-CM

## 2022-05-16 DIAGNOSIS — I10 Essential (primary) hypertension: Secondary | ICD-10-CM | POA: Diagnosis present

## 2022-05-16 DIAGNOSIS — I69320 Aphasia following cerebral infarction: Secondary | ICD-10-CM | POA: Diagnosis not present

## 2022-05-16 DIAGNOSIS — I69351 Hemiplegia and hemiparesis following cerebral infarction affecting right dominant side: Secondary | ICD-10-CM | POA: Diagnosis present

## 2022-05-16 DIAGNOSIS — R42 Dizziness and giddiness: Secondary | ICD-10-CM | POA: Diagnosis not present

## 2022-05-16 DIAGNOSIS — Z741 Need for assistance with personal care: Secondary | ICD-10-CM | POA: Diagnosis present

## 2022-05-16 DIAGNOSIS — E785 Hyperlipidemia, unspecified: Secondary | ICD-10-CM | POA: Diagnosis present

## 2022-05-16 DIAGNOSIS — J449 Chronic obstructive pulmonary disease, unspecified: Secondary | ICD-10-CM | POA: Diagnosis present

## 2022-05-16 DIAGNOSIS — Z6831 Body mass index (BMI) 31.0-31.9, adult: Secondary | ICD-10-CM

## 2022-05-16 DIAGNOSIS — Z7902 Long term (current) use of antithrombotics/antiplatelets: Secondary | ICD-10-CM

## 2022-05-16 DIAGNOSIS — F1010 Alcohol abuse, uncomplicated: Secondary | ICD-10-CM | POA: Diagnosis present

## 2022-05-16 DIAGNOSIS — F1290 Cannabis use, unspecified, uncomplicated: Secondary | ICD-10-CM | POA: Diagnosis present

## 2022-05-16 DIAGNOSIS — D649 Anemia, unspecified: Secondary | ICD-10-CM | POA: Diagnosis not present

## 2022-05-16 DIAGNOSIS — Z720 Tobacco use: Secondary | ICD-10-CM

## 2022-05-16 DIAGNOSIS — Z79899 Other long term (current) drug therapy: Secondary | ICD-10-CM

## 2022-05-16 DIAGNOSIS — I639 Cerebral infarction, unspecified: Secondary | ICD-10-CM | POA: Diagnosis present

## 2022-05-16 DIAGNOSIS — R630 Anorexia: Secondary | ICD-10-CM | POA: Diagnosis present

## 2022-05-16 DIAGNOSIS — I6522 Occlusion and stenosis of left carotid artery: Secondary | ICD-10-CM | POA: Diagnosis present

## 2022-05-16 DIAGNOSIS — R112 Nausea with vomiting, unspecified: Secondary | ICD-10-CM | POA: Diagnosis not present

## 2022-05-16 DIAGNOSIS — Z7141 Alcohol abuse counseling and surveillance of alcoholic: Secondary | ICD-10-CM

## 2022-05-16 DIAGNOSIS — E669 Obesity, unspecified: Secondary | ICD-10-CM | POA: Diagnosis present

## 2022-05-16 DIAGNOSIS — F17210 Nicotine dependence, cigarettes, uncomplicated: Secondary | ICD-10-CM | POA: Diagnosis present

## 2022-05-16 DIAGNOSIS — L732 Hidradenitis suppurativa: Secondary | ICD-10-CM | POA: Diagnosis present

## 2022-05-16 MED ORDER — GUAIFENESIN-DM 100-10 MG/5ML PO SYRP: 5.0000 mL | ORAL_SOLUTION | Freq: Four times a day (QID) | ORAL | Status: DC | PRN

## 2022-05-16 MED ORDER — FLEET ENEMA 7-19 GM/118ML RE ENEM: 1.0000 | ENEMA | Freq: Once | RECTAL | Status: DC | PRN

## 2022-05-16 MED ORDER — BACLOFEN 5 MG HALF TABLET
5.0000 mg | ORAL_TABLET | Freq: Three times a day (TID) | ORAL | Status: DC
  Administered 2022-05-16 – 2022-05-27 (×32): 5 mg via ORAL
  Filled 2022-05-16 (×32): qty 1

## 2022-05-16 MED ORDER — ALUM & MAG HYDROXIDE-SIMETH 200-200-20 MG/5ML PO SUSP
30.0000 mL | ORAL | Status: DC | PRN
Start: 2022-05-16 — End: 2022-05-27

## 2022-05-16 MED ORDER — VANCOMYCIN HCL IN DEXTROSE 1-5 GM/200ML-% IV SOLN
1000.0000 mg | Freq: Two times a day (BID) | INTRAVENOUS | 0 refills | Status: DC
  Filled 2022-05-16: qty 4000, 10d supply, fill #0

## 2022-05-16 MED ORDER — SENNOSIDES-DOCUSATE SODIUM 8.6-50 MG PO TABS
1.0000 | ORAL_TABLET | Freq: Two times a day (BID) | ORAL | 1 refills | Status: DC
  Filled 2022-05-16: qty 30, 15d supply, fill #0

## 2022-05-16 MED ORDER — ASPIRIN 81 MG PO TBEC
81.0000 mg | DELAYED_RELEASE_TABLET | Freq: Every day | ORAL | Status: DC
  Administered 2022-05-17 – 2022-05-27 (×11): 81 mg via ORAL
  Filled 2022-05-16 (×11): qty 1

## 2022-05-16 MED ORDER — LORAZEPAM 2 MG/ML IJ SOLN: 1.0000 mg | INTRAMUSCULAR | Status: AC | PRN

## 2022-05-16 MED ORDER — AMLODIPINE BESYLATE 10 MG PO TABS
10.0000 mg | ORAL_TABLET | Freq: Every day | ORAL | Status: DC
  Administered 2022-05-17 – 2022-05-27 (×11): 10 mg via ORAL
  Filled 2022-05-16 (×12): qty 1

## 2022-05-16 MED ORDER — VANCOMYCIN HCL IN DEXTROSE 1-5 GM/200ML-% IV SOLN
1000.0000 mg | Freq: Two times a day (BID) | INTRAVENOUS | Status: DC
  Administered 2022-05-17: 1000 mg via INTRAVENOUS
  Filled 2022-05-16: qty 200

## 2022-05-16 MED ORDER — NICOTINE 14 MG/24HR TD PT24
14.0000 mg | MEDICATED_PATCH | Freq: Every day | TRANSDERMAL | Status: DC
  Administered 2022-05-16 – 2022-05-27 (×12): 14 mg via TRANSDERMAL
  Filled 2022-05-16 (×12): qty 1

## 2022-05-16 MED ORDER — ATORVASTATIN CALCIUM 80 MG PO TABS
80.0000 mg | ORAL_TABLET | Freq: Every day | ORAL | Status: DC
  Administered 2022-05-17 – 2022-05-27 (×11): 80 mg via ORAL
  Filled 2022-05-16 (×11): qty 1

## 2022-05-16 MED ORDER — DIPHENHYDRAMINE HCL 12.5 MG/5ML PO ELIX
12.5000 mg | ORAL_SOLUTION | Freq: Four times a day (QID) | ORAL | Status: DC | PRN
  Administered 2022-05-16 – 2022-05-23 (×6): 25 mg via ORAL
  Filled 2022-05-16 (×6): qty 10

## 2022-05-16 MED ORDER — PIPERACILLIN-TAZOBACTAM 3.375 G IVPB
3.3750 g | Freq: Three times a day (TID) | INTRAVENOUS | Status: DC
  Administered 2022-05-16 – 2022-05-19 (×9): 3.375 g via INTRAVENOUS
  Filled 2022-05-16 (×10): qty 50

## 2022-05-16 MED ORDER — ACETAMINOPHEN 325 MG PO TABS
325.0000 mg | ORAL_TABLET | ORAL | Status: DC | PRN
  Administered 2022-05-17 – 2022-05-27 (×6): 650 mg via ORAL
  Filled 2022-05-16 (×7): qty 2

## 2022-05-16 MED ORDER — BISACODYL 10 MG RE SUPP
10.0000 mg | Freq: Every day | RECTAL | Status: DC | PRN
Start: 2022-05-16 — End: 2022-05-27

## 2022-05-16 MED ORDER — PIPERACILLIN-TAZOBACTAM 3.375 G IVPB
3.3750 g | Freq: Three times a day (TID) | INTRAVENOUS | 0 refills | Status: DC
  Filled 2022-05-16: qty 50, 1d supply, fill #0

## 2022-05-16 MED ORDER — PIPERACILLIN-TAZOBACTAM 3.375 G IVPB: 3.3750 g | Freq: Three times a day (TID) | INTRAVENOUS | Status: DC

## 2022-05-16 MED ORDER — PROCHLORPERAZINE EDISYLATE 10 MG/2ML IJ SOLN: 5.0000 mg | Freq: Four times a day (QID) | INTRAMUSCULAR | Status: DC | PRN

## 2022-05-16 MED ORDER — ENOXAPARIN SODIUM 40 MG/0.4ML IJ SOSY
40.0000 mg | PREFILLED_SYRINGE | INTRAMUSCULAR | Status: DC
  Administered 2022-05-16 – 2022-05-26 (×11): 40 mg via SUBCUTANEOUS
  Filled 2022-05-16 (×11): qty 0.4

## 2022-05-16 MED ORDER — VANCOMYCIN HCL IN DEXTROSE 1-5 GM/200ML-% IV SOLN: 1000.0000 mg | Freq: Two times a day (BID) | INTRAVENOUS | Status: DC

## 2022-05-16 MED ORDER — METOPROLOL SUCCINATE ER 25 MG PO TB24
25.0000 mg | ORAL_TABLET | Freq: Every day | ORAL | Status: DC
  Administered 2022-05-17 – 2022-05-24 (×8): 25 mg via ORAL
  Filled 2022-05-16 (×8): qty 1

## 2022-05-16 MED ORDER — PROCHLORPERAZINE 25 MG RE SUPP
12.5000 mg | Freq: Four times a day (QID) | RECTAL | Status: DC | PRN
Start: 2022-05-16 — End: 2022-05-27

## 2022-05-16 MED ORDER — SODIUM CHLORIDE 0.9 % IV SOLN
INTRAVENOUS | Status: DC | PRN
  Administered 2022-05-16: 10 mL via INTRAVENOUS

## 2022-05-16 MED ORDER — PANTOPRAZOLE SODIUM 40 MG PO TBEC
40.0000 mg | DELAYED_RELEASE_TABLET | Freq: Every day | ORAL | 1 refills | Status: DC
  Filled 2022-05-16: qty 30, 30d supply, fill #0

## 2022-05-16 MED ORDER — METOPROLOL SUCCINATE ER 25 MG PO TB24
25.0000 mg | ORAL_TABLET | Freq: Every day | ORAL | 1 refills | Status: DC
  Filled 2022-05-16: qty 30, 30d supply, fill #0

## 2022-05-16 MED ORDER — VANCOMYCIN HCL 2000 MG/400ML IV SOLN
2000.0000 mg | Freq: Once | INTRAVENOUS | Status: DC
  Filled 2022-05-16: qty 400

## 2022-05-16 MED ORDER — THIAMINE HCL 100 MG PO TABS
100.0000 mg | ORAL_TABLET | Freq: Every day | ORAL | Status: DC
  Administered 2022-05-17 – 2022-05-27 (×11): 100 mg via ORAL
  Filled 2022-05-16 (×11): qty 1

## 2022-05-16 MED ORDER — TRAMADOL HCL 50 MG PO TABS
50.0000 mg | ORAL_TABLET | Freq: Three times a day (TID) | ORAL | Status: DC
  Administered 2022-05-16 – 2022-05-26 (×41): 50 mg via ORAL
  Filled 2022-05-16 (×44): qty 1

## 2022-05-16 MED ORDER — VITAMIN B-1 100 MG PO TABS
100.0000 mg | ORAL_TABLET | Freq: Every day | ORAL | 1 refills | Status: DC
  Filled 2022-05-16: qty 30, 30d supply, fill #0

## 2022-05-16 MED ORDER — TRAMADOL HCL 50 MG PO TABS
50.0000 mg | ORAL_TABLET | Freq: Four times a day (QID) | ORAL | Status: DC
  Administered 2022-05-16: 50 mg via ORAL
  Filled 2022-05-16: qty 1

## 2022-05-16 MED ORDER — ATORVASTATIN CALCIUM 80 MG PO TABS: 80.0000 mg | ORAL_TABLET | Freq: Every day | ORAL | Status: DC

## 2022-05-16 MED ORDER — TRAZODONE HCL 50 MG PO TABS
25.0000 mg | ORAL_TABLET | Freq: Every evening | ORAL | Status: DC | PRN
  Filled 2022-05-16: qty 1

## 2022-05-16 MED ORDER — VANCOMYCIN HCL 2000 MG/400ML IV SOLN
2000.0000 mg | Freq: Once | INTRAVENOUS | Status: AC
Start: 2022-05-16 — End: 2022-05-16
  Administered 2022-05-16: 2000 mg via INTRAVENOUS
  Filled 2022-05-16: qty 400

## 2022-05-16 MED ORDER — ASPIRIN 81 MG PO TBEC: 81.0000 mg | DELAYED_RELEASE_TABLET | Freq: Every day | ORAL | Status: DC

## 2022-05-16 MED ORDER — LORAZEPAM 1 MG PO TABS: 1.0000 mg | ORAL_TABLET | ORAL | Status: AC | PRN

## 2022-05-16 MED ORDER — SENNOSIDES-DOCUSATE SODIUM 8.6-50 MG PO TABS
1.0000 | ORAL_TABLET | Freq: Two times a day (BID) | ORAL | Status: DC
  Administered 2022-05-16 – 2022-05-27 (×20): 1 via ORAL
  Filled 2022-05-16 (×22): qty 1

## 2022-05-16 MED ORDER — BACLOFEN 5 MG PO TABS
5.0000 mg | ORAL_TABLET | Freq: Three times a day (TID) | ORAL | 0 refills | Status: DC
  Filled 2022-05-16: qty 30, 10d supply, fill #0

## 2022-05-16 MED ORDER — ASPIRIN 81 MG PO TBEC
81.0000 mg | DELAYED_RELEASE_TABLET | Freq: Every day | ORAL | 12 refills | Status: DC
  Filled 2022-05-16: qty 30, 30d supply, fill #0

## 2022-05-16 MED ORDER — POLYETHYLENE GLYCOL 3350 17 G PO PACK: 17.0000 g | PACK | Freq: Every day | ORAL | Status: DC | PRN

## 2022-05-16 MED ORDER — AMLODIPINE BESYLATE 10 MG PO TABS: 10.0000 mg | ORAL_TABLET | Freq: Every day | ORAL | Status: DC

## 2022-05-16 MED ORDER — FOLIC ACID 1 MG PO TABS
1.0000 mg | ORAL_TABLET | Freq: Every day | ORAL | Status: DC
  Administered 2022-05-17 – 2022-05-27 (×11): 1 mg via ORAL
  Filled 2022-05-16 (×11): qty 1

## 2022-05-16 MED ORDER — FOLIC ACID 1 MG PO TABS
1.0000 mg | ORAL_TABLET | Freq: Every day | ORAL | 1 refills | Status: DC
  Filled 2022-05-16: qty 30, 30d supply, fill #0

## 2022-05-16 MED ORDER — CLOPIDOGREL BISULFATE 75 MG PO TABS
75.0000 mg | ORAL_TABLET | Freq: Every day | ORAL | Status: DC
  Administered 2022-05-17 – 2022-05-27 (×11): 75 mg via ORAL
  Filled 2022-05-16 (×11): qty 1

## 2022-05-16 MED ORDER — ADULT MULTIVITAMIN W/MINERALS CH
1.0000 | ORAL_TABLET | Freq: Every day | ORAL | 1 refills | Status: DC
  Filled 2022-05-16: qty 30, 30d supply, fill #0

## 2022-05-16 MED ORDER — PROCHLORPERAZINE MALEATE 5 MG PO TABS
5.0000 mg | ORAL_TABLET | Freq: Four times a day (QID) | ORAL | Status: DC | PRN
  Administered 2022-05-19 – 2022-05-20 (×2): 5 mg via ORAL
  Administered 2022-05-21: 10 mg via ORAL
  Filled 2022-05-16: qty 1
  Filled 2022-05-16: qty 2
  Filled 2022-05-16: qty 1

## 2022-05-16 MED ORDER — PANTOPRAZOLE SODIUM 40 MG PO TBEC
40.0000 mg | DELAYED_RELEASE_TABLET | Freq: Every day | ORAL | Status: DC
  Administered 2022-05-16 – 2022-05-26 (×11): 40 mg via ORAL
  Filled 2022-05-16 (×11): qty 1

## 2022-05-16 MED ORDER — CLOPIDOGREL BISULFATE 75 MG PO TABS
75.0000 mg | ORAL_TABLET | Freq: Every day | ORAL | 1 refills | Status: DC
  Filled 2022-05-16: qty 30, 30d supply, fill #0

## 2022-05-16 MED ORDER — ADULT MULTIVITAMIN W/MINERALS CH
1.0000 | ORAL_TABLET | Freq: Every day | ORAL | Status: DC
  Administered 2022-05-17 – 2022-05-27 (×11): 1 via ORAL
  Filled 2022-05-16 (×11): qty 1

## 2022-05-16 NOTE — Progress Notes (Addendum)
PMR Admission Coordinator Pre-Admission Assessment   Patient: James Lambert is an 56 y.o., male MRN: [id number] DOB: [birth year] Height:   Weight:     Insurance Information HMO:     PPO:      PCP:      IPA:      80/20:      OTHER:  PRIMARY: None   SECONDARY:       Policy#:      Phone#:    Artist:       Phone#:    The "Data Collection Information Summary" for patients in Inpatient Rehabilitation Facilities with attached "Privacy Act Statement-Health Care Records" was provided and verbally reviewed with: pt.   Emergency Contact Information Contact Information       Name Relation Home Work Mobile    [place] Brother     [phone]    Landry Mellow [phone]               Current Medical History  Patient Admitting Diagnosis: CVA History of Present Illness: [name] is a 56 year old male with heavy daily alcohol intake who was at his group home drinking with his brother.  His brother noticed right facial droop and decreased level of consciousness.  EMS was called and he was taken to [facility] emergency department on 05/10/2022.  Code stroke called and Dr. Otelia Limes evaluated the patient.  History taken from the patient's brother, [name].  He stated the patient had prior history of several strokes and is at his baseline neurologically at the time of onset of symptoms.  He presents with right-sided weakness.  CT of head without acute intracranial abnormality.  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  CT perfusion with a 4 cc cute core infarct at the anterior left frontal lobe with a large surrounding ischemic penumbra.  No absolute contraindications tenecteplase and this was discussed extensively with the patient's brother.  Administered at 2022 hrs.  Neuro IR, Dr. Tommie Sams consulted.  Code IR called out to [facility] and the patient was transferred to [facility].  He underwent diagnostic cerebral angiogram with mechanical thrombectomy later that evening.  Transferred to intensive care unit.  On 6/4, he passed his swallow evaluation and p.o. medications including Norvasc, metoprolol and atorvastatin were resumed.  Critical care medicine consulted on 6/4 due to onset of agitation suspected alcohol withdrawal.  CIWA protocol initiated.  On 6/5, the patient was alert and oriented and able to give clear and coherent history.  No aphasia or neglect.  His speech is fluent with only slight word hesitancy.  Right upper extremity and right lower extremity strength 4/5.  He was transferred out of the care unit on 6/6.  He will be maintained on aspirin 81 mg daily and Plavix 75 mg daily for 3 weeks followed by Plavix alone.  VTE prophylaxis SCDs.  Tolerating heart healthy diet.  The patient requires inpatient medicine and rehabilitation evaluations and services for ongoing dysfunction secondary to scattered small acute infarcts in the left cerebral hemisphere and right posterior lentiform nucleus.   Complete NIHSS TOTAL: 9   Patient[facility] has been reviewed by the rehabilitation admission coordinator and physician.   Past Medical History      Past Medical History:  Diagnosis Date   Asthma     COPD (chronic obstructive pulmonary disease) (HCC)  Hidradenitis suppurativa      diagnosed in [facility] ED based on history and physical exam   Hypertension        Has the patient had major surgery during 100 days prior to admission? Yes   Family History   family history is not on file.   Current Medications   Current Facility-Administered Medications:    acetaminophen (TYLENOL) tablet 650 mg, 650 mg, Oral, Q4H PRN **OR** acetaminophen (TYLENOL) 160 MG/5ML solution 650 mg, 650 mg, Per Tube, Q4H PRN **OR** acetaminophen (TYLENOL) suppository 650 mg, 650 mg, Rectal, Q4H PRN, Theda Sers, Unk Pinto, MD   amLODipine  (NORVASC) tablet 10 mg, 10 mg, Oral, Daily, Shafer, Devon, NP, 10 mg at 05/12/22 1191   aspirin EC tablet 81 mg, 81 mg, Oral, Daily, Shafer, Devon, NP, 81 mg at 05/12/22 4782   Chlorhexidine Gluconate Cloth 2 % PADS 6 each, 6 each, Topical, Daily, Garvin Fila, MD, 6 each at 05/12/22 1018   clevidipine (CLEVIPREX) infusion 0.5 mg/mL, 0-32 mg/hr, Intravenous, Continuous, Garvin Fila, MD, Stopped at 95/62/13 0865   folic acid (FOLVITE) tablet 1 mg, 1 mg, Oral, Daily, Bowser, Grace E, NP, 1 mg at 05/12/22 0918   labetalol (NORMODYNE) injection 20 mg, 20 mg, Intravenous, Q2H PRN, Charlean Merl, Marcelino Scot, NP, 20 mg at 05/12/22 1357   LORazepam (ATIVAN) tablet 1-4 mg, 1-4 mg, Oral, Q1H PRN **OR** LORazepam (ATIVAN) injection 1-4 mg, 1-4 mg, Intravenous, Q1H PRN, Bowser, Laurel Dimmer, NP   metoprolol succinate (TOPROL-XL) 24 hr tablet 25 mg, 25 mg, Oral, Daily, Shafer, Devon, NP, 25 mg at 05/12/22 7846   multivitamin with minerals tablet 1 tablet, 1 tablet, Oral, Daily, Bowser, Grace E, NP, 1 tablet at 05/12/22 0918   ondansetron (ZOFRAN) injection 4 mg, 4 mg, Intravenous, Q6H PRN, de Sindy Messing, Buckner, MD   pantoprazole (PROTONIX) injection 40 mg, 40 mg, Intravenous, QHS, Collins, Unk Pinto, MD, 40 mg at 05/11/22 2146   senna-docusate (Senokot-S) tablet 1 tablet, 1 tablet, Oral, BID, Gwinda Maine, MD, 1 tablet at 05/12/22 9629   thiamine tablet 100 mg, 100 mg, Oral, Daily, 100 mg at 05/12/22 0918 **OR** thiamine (B-1) injection 100 mg, 100 mg, Intravenous, Daily, Bowser, Laurel Dimmer, NP   Patients Current Diet:  Diet Order                  Diet Heart Room service appropriate? Yes with Assist; Fluid consistency: Thin  Diet effective now                         Precautions / Restrictions Precautions Precautions: Fall Restrictions Weight Bearing Restrictions: No    Has the patient had 2 or more falls or a fall with injury in the past year? No   Prior Activity Level Community (5-7x/wk):  Pt. active in the community PTA   Prior Functional Level Self Care: Did the patient need help bathing, dressing, using the toilet or eating? Independent   Indoor Mobility: Did the patient need assistance with walking from room to room (with or without device)? Independent   Stairs: Did the patient need assistance with internal or external stairs (with or without device)? Independent   Functional Cognition: Did the patient need help planning regular tasks such as shopping or remembering to take medications? Needed some help   Patient Information Are you of Hispanic, Latino/a,or Spanish origin?: A. No, not of Hispanic, Latino/a, or Spanish origin What is your race?: B. Black or African  American Do you need or want an interpreter to communicate with a doctor or health care staff?: 2. no   Patient's Response To:  Health Literacy and Transportation Is the patient able to respond to health literacy and transportation needs?: Yes Health Literacy - How often do you need to have someone help you when you read instructions, pamphlets, or other written material from your doctor or pharmacy?: Never In the past 12 months, has lack of transportation kept you from medical appointments or from getting medications?: Yes In the past 12 months, has lack of transportation kept you from meetings, work, or from getting things needed for daily living?: No   Development worker, international aid / Equipment Home Equipment: BSC/3in1, Civil engineer, contracting, Grab bars - tub/shower, Radio producer - single point, Rollator (4 wheels), Other (comment)   Prior Device Use: Indicate devices/aids used by the patient prior to current illness, exacerbation or injury? Walker   Current Functional Level Cognition   Overall Cognitive Status: Difficult to assess Orientation Level: Oriented X4 Following Commands: Follows one step commands inconsistently, Follows one step commands with increased time Safety/Judgement: Decreased awareness of safety, Decreased  awareness of deficits    Extremity Assessment (includes Sensation/Coordination)   Upper Extremity Assessment: RUE deficits/detail RUE Deficits / Details: reports shoulder is sore, AROM WFL  Lower Extremity Assessment: LLE deficits/detail, RLE deficits/detail RLE Deficits / Details: pt with 4/5 strength grossly, ROM WFL LLE Deficits / Details: pt with functional ROM and strength, pt reports pain intermittently in L foot, then left lower leg, later no pain     ADLs   Overall ADL's : Needs assistance/impaired Eating/Feeding: Sitting, Minimal assistance Grooming: Sitting, Minimal assistance Upper Body Bathing: Sitting, Minimal assistance Lower Body Bathing: Moderate assistance Lower Body Bathing Details (indicate cue type and reason): min A +2 sit<>stand Upper Body Dressing : Moderate assistance, Sitting Lower Body Dressing: Total assistance Lower Body Dressing Details (indicate cue type and reason): min A +2 sit<>stand Toilet Transfer: Min guard, Stand-pivot, +2 for safety/equipment Toilet Transfer Details (indicate cue type and reason): Pt comes up to stand and stays flexed at hips ~90 degrees to turn from bed to recliner Toileting- Clothing Manipulation and Hygiene: Total assistance Toileting - Clothing Manipulation Details (indicate cue type and reason): min A +2 sit<>stand     Mobility   Overal bed mobility: Needs Assistance Bed Mobility: Supine to Sit Supine to sit: Min guard, HOB elevated     Transfers   Overall transfer level: Needs assistance Equipment used: 1 person hand held assist, None Transfers: Sit to/from Stand, Bed to chair/wheelchair/BSC Sit to Stand: Min assist, +2 physical assistance Bed to/from chair/wheelchair/BSC transfer type:: Step pivot Step pivot transfers: Min guard General transfer comment: pt with difficulty extending trunk, performing step pivot in flexed position at trunk, utilizing UE support of bed rail and armrest for support. Quick descent into  sitting with stand to sit     Ambulation / Gait / Stairs / Wheelchair Mobility   Ambulation/Gait Ambulation/Gait assistance:  (deferred 2/2 safety concerns)     Posture / Balance Balance Overall balance assessment: Needs assistance Sitting-balance support: No upper extremity supported, Feet supported Sitting balance-Leahy Scale: Fair Standing balance support: Single extremity supported Standing balance-Leahy Scale: Poor Standing balance comment: minG-minA, pt resistant to assistance from therapists, benefits from UE support of an assistive device or railing     Special needs/care consideration Skin intact  and Special service needs      Previous Home Environment (from acute therapy documentation) Living Arrangements:  Other relatives (brother) Available Help at Discharge: Family, Friend(s), Available PRN/intermittently Type of Home: Apartment (pt reports apartment, chart denotes group home) Home Layout: One level Home Access: Stairs to enter Entrance Stairs-Rails: Can reach both Entrance Stairs-Number of Steps: 5 Bathroom Shower/Tub: Tub/shower unit, Architectural technologist: Standard Bathroom Accessibility: Yes How Accessible: Accessible via walker [place]: Yes Additional Comments: pt reports living in an apartment, notes earlier in admission reports the pt came from a group home   Discharge Living Setting Plans for Discharge Living Setting: Patient's home Type of Home at Discharge: Apartment Discharge Home Layout: One level Discharge Home Access: Stairs to enter Entrance Stairs-Rails: Right Entrance Stairs-Number of Steps: 2 Discharge Bathroom Shower/Tub: Tub/shower unit   [place] Patient Roles: Other (Comment) Contact Information: [name] (brother) [phone] Anticipated Caregiver: Randall Hiss (brother) [phone] Anticipated Caregiver's Contact Information: Randall Hiss is disabled, can provide 24/7 supervision. Have aide in home 8 hrs. [name] to  assist as needed (directs care) Ability/Limitations of Caregiver: Can provide supervision Caregiver Availability: 24/7 Discharge Plan Discussed with Primary Caregiver: Yes Is Caregiver In Agreement with Plan?: Yes Does Caregiver/Family have Issues with Lodging/Transportation while Pt is in Rehab?: No   Goals Patient/Family Goal for Rehab: PT/OT/SLP  Mod I Expected length of stay: 5-7 days Pt/Family Agrees to Admission and willing to participate: Yes Program Orientation Provided & Reviewed with Pt/Caregiver Including Roles  & Responsibilities: Yes   Decrease burden of Care through IP rehab admission: Specialzed equipment needs, Decrease number of caregivers, Bowel and bladder program, and Patient/family education   Possible need for SNF placement upon discharge: not anticipated   Patient Condition: I have reviewed medical records from [facility], spoken with CM, and patient and family member. I met with patient at the bedside for inpatient rehabilitation assessment.  Patient will benefit from ongoing PT, OT, and SLP, can actively participate in 3 hours of therapy a day 5 days of the week, and can make measurable gains during the admission.  Patient will also benefit from the coordinated team approach during an Inpatient Acute Rehabilitation admission.  The patient will receive intensive therapy as well as Rehabilitation physician, nursing, social worker, and care management interventions.  Due to safety, disease management, medication administration, pain management, and patient education the patient requires 24 hour a day rehabilitation nursing.  The patient is currently Min A-Min G with mobility and basic ADLs.  Discharge setting and therapy post discharge at home with home health is anticipated.  Patient has agreed to participate in the Acute Inpatient Rehabilitation Program and will admit today.   Preadmission Screen Completed By:  Genella Mech, 05/12/2022 2:05  PM ______________________________________________________________________   Discussed status with Dr. Curlene Dolphin on 05/16/22 at 71 and received approval for admission today.   Admission Coordinator:  Genella Mech, CCC-SLP, time 1107/Date 05/16/22    Assessment/Plan: Diagnosis: Does the need for close, 24 hr/day Medical supervision in concert with the patient's rehab needs make it unreasonable for this patient to be served in a less intensive setting? Yes Co-Morbidities requiring supervision/potential complications: asthma, COPD, HTN, CVA Due to bladder management, bowel management, safety, skin/wound care, disease management, medication administration, pain management, and patient education, does the patient require 24 hr/day rehab nursing? Yes Does the patient require coordinated care of a physician, rehab nurse, PT, OT, and SLP to address physical and functional deficits in the context of the above medical diagnosis(es)? Yes Addressing deficits in the following areas: balance, endurance, locomotion, strength, transferring, bowel/bladder control, bathing,  dressing, feeding, grooming, toileting, cognition, speech, language, swallowing, and psychosocial support Can the patient actively participate in an intensive therapy program of at least 3 hrs of therapy 5 days a week? Yes The potential for patient to make measurable gains while on inpatient rehab is excellent Anticipated functional outcomes upon discharge from inpatient rehab: modified independent PT, modified independent OT, modified independent SLP Estimated rehab length of stay to reach the above functional goals is: 5-7days Anticipated discharge destination: Home 10. Overall Rehab/Functional Prognosis: excellent     MD Signature: Jennye Boroughs

## 2022-05-16 NOTE — H&P (Signed)
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  His brother noticed right facial droop and decreased level of consciousness.  EMS was called and he was taken to [facility] emergency department on 05/10/2022.  Code stroke called and Dr. Otelia Limes evaluated the patient.  History taken from the patient's brother, [name].  He stated the patient had prior history of several strokes and is at his baseline neurologically at the time of onset of symptoms.  He presents with right-sided weakness.  CT of head without acute intracranial abnormality.  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  CT perfusion with a 4 cc cute core infarct at the anterior left frontal lobe with a large surrounding ischemic penumbra.  No absolute contraindications tenecteplase and this was discussed extensively with the patient's brother.  Administered at 2022 hrs.  Neuro IR, Dr. Tommie Sams consulted.  Code IR called out to [facility] and the patient was transferred to [facility].  He underwent diagnostic cerebral angiogram with mechanical thrombectomy later that evening.  Transferred to intensive care unit.  On 6/4, he passed his swallow evaluation and p.o. medications including Norvasc, metoprolol and atorvastatin were resumed.  Critical care medicine consulted on 6/4 due to onset of agitation suspected alcohol withdrawal.  CIWA protocol initiated.  On 6/5, the patient was alert and oriented and able to give clear and coherent history.  No aphasia or neglect.  His speech is fluent with only slight word hesitancy.  Right upper extremity and right lower extremity strength 4/5.   He was transferred out of the care unit on 6/6.  He will be maintained on aspirin 81 mg daily and Plavix 75 mg daily for 3 weeks followed by Plavix alone.  VTE prophylaxis SCDs.  Tolerating heart healthy diet.  The patient requires inpatient medicine and rehabilitation evaluations and services for ongoing dysfunction secondary to scattered small acute infarcts in the left cerebral hemisphere and right posterior lentiform nucleus.  He was found to have hidradenitis suppurativa and was started on IV antibiotics.  Patient reports he is having some cravings for cigarettes.   2D echo ejection fraction 60 to 65% with no LVH A1c 5.3 LDL 91 Review of Systems  Constitutional:  Negative for chills and fever.  HENT:  Negative for congestion and hearing loss.   Eyes:  Negative for blurred vision and double vision.  Respiratory:  Positive for cough. Negative for sputum production.   Cardiovascular:  Negative for chest pain and palpitations.  Gastrointestinal:  Negative for constipation, nausea and vomiting.  Genitourinary:  Positive for urgency. Negative for dysuria.  Neurological:  Negative for dizziness and headaches.        Past Medical History:  Diagnosis Date   Asthma     COPD (chronic obstructive pulmonary disease) (HCC)     Hidradenitis suppurativa      diagnosed in [facility] ED based on history and physical exam   Hypertension           Past Surgical History:  Procedure Laterality Date   IR CT HEAD LTD   05/10/2022   IR FLUORO GUIDED NEEDLE PLC ASPIRATION/INJECTION LOC   03/20/2022  IR PERCUTANEOUS ART THROMBECTOMY/INFUSION INTRACRANIAL INC DIAG ANGIO   05/10/2022   IR US GUIDE VASC ACCESS RIGHT   05/10/2022   RADIOLOGY WITH ANESTHESIA N/A 05/10/2022    Procedure: IR WITH ANESTHESIA;  Surgeon: Julieanne Cotton, MD;  Location: [facility] OR;  Service: Radiology;  Laterality: N/A;   TEE WITHOUT CARDIOVERSION N/A 03/11/2022    Procedure: TRANSESOPHAGEAL ECHOCARDIOGRAM (TEE);  Surgeon: Lamar Blinks, MD;   Location: [facility] ORS;  Service: Cardiovascular;  Laterality: N/A;    No family history on file. Social History:  reports that he has been smoking cigarettes. He has been smoking an average of .5 packs per day. He has never used smokeless tobacco. He reports current alcohol use. He reports current drug use. Drug: Marijuana. Allergies: No Known Allergies       Medications Prior to Admission  Medication Sig Dispense Refill   amLODipine (NORVASC) 10 MG tablet Take 1 tablet (10 mg total) by mouth once daily. 30 tablet 3   amoxicillin-clavulanate (AUGMENTIN) 875-125 MG tablet Take 1 tablet by mouth 2 (two) times daily. (Patient taking differently: Take 1 tablet by mouth 2 (two) times daily. 10 day course. Pt has 3 tablets left.) 20 tablet 0   metoprolol succinate (TOPROL-XL) 25 MG 24 hr tablet Take 1 tablet (25 mg total) by mouth once daily. 30 tablet 2   aspirin 81 MG chewable tablet Chew 1 tablet (81 mg total) by mouth once daily. (Patient not taking: Reported on 05/11/2022) 90 tablet 0   atorvastatin (LIPITOR) 80 MG tablet Take 1 tablet (80 mg total) by mouth once daily. (Patient not taking: Reported on 05/11/2022) 90 tablet 0   clopidogrel (PLAVIX) 75 MG tablet Take 1 tablet (75 mg total) by mouth once daily. (Patient not taking: Reported on 05/11/2022) 90 tablet 0   HYDROcodone-acetaminophen (NORCO/VICODIN) 5-325 MG tablet Take 1 tablet by mouth every 6 (six) hours as needed for moderate pain. (Patient not taking: Reported on 05/11/2022) 15 tablet 0          Home: Home Living Family/patient expects to be discharged to:: Private residence Living Arrangements: Other relatives (brother) Available Help at Discharge: Family, Friend(s), Available PRN/intermittently Type of Home: Apartment Home Access: Stairs to enter Secretary/administrator of Steps: 5 Entrance Stairs-Rails: Can reach both Home Layout: One level Bathroom Shower/Tub: Tub/shower unit, Engineer, building services: Academic librarian: Yes Home Equipment: BSC/3in1, Shower seat, Grab bars - tub/shower, Cane - single point, Rollator (4 wheels), Other (comment) Additional Comments: pt reports living in an apartment, notes earlier in admission reports the pt came from a group home  Lives With: Other (Comment), Family (brother)   Functional History: Prior Function Prior Level of Function : Needs assist Mobility Comments: pt reports he was ambulating with use of a cane. Pt does not drive, friend takes him when transportation is needed ADLs Comments: pt reports independence with ADLs, utilizes shower seat   Functional Status:  Mobility: Bed Mobility Overal bed mobility: Needs Assistance Bed Mobility: Supine to Sit Rolling: Supervision Sidelying to sit: Supervision Supine to sit: Supervision, HOB elevated General bed mobility comments: Supervision for safety Transfers Overall transfer level: Needs assistance Equipment used: Rolling walker (2 wheels) Transfers: Sit to/from Stand, Bed to chair/wheelchair/BSC Sit to Stand: Min guard Bed to/from chair/wheelchair/BSC transfer type:: Step pivot Step pivot transfers: Min guard General transfer comment: cues for posture correction during ambulation tends to stay flexed and head down Ambulation/Gait Ambulation/Gait assistance: Min assist, Min guard Gait Distance (Feet): 280 Feet Assistive device:  Rolling walker (2 wheels) Gait Pattern/deviations: Step-through pattern, Decreased stride length, Trunk flexed, Wide base of support General Gait Details: Worked on correcting posture, gait step length and speed.  Pt still mildly antalgic, stooped and slower than age appropriate. Gait velocity: reduced Gait velocity interpretation: <1.8 ft/sec, indicate of risk for recurrent falls   ADL: ADL Overall ADL's : Needs assistance/impaired Eating/Feeding: Sitting, Minimal assistance Grooming: Wash/dry hands, Wash/dry face, Oral care, Standing, Supervision/safety Grooming  Details (indicate cue type and reason): standing at sink with RW Upper Body Bathing: Sitting, Minimal assistance Lower Body Bathing: Min guard, Sit to/from stand Lower Body Bathing Details (indicate cue type and reason): pt. stood to wash peri areas/buttocks Upper Body Dressing : Moderate assistance, Sitting Lower Body Dressing: Total assistance Lower Body Dressing Details (indicate cue type and reason): min A +2 sit<>stand Toilet Transfer: Min guard, Rolling walker (2 wheels), Ambulation, Regular Toilet Toilet Transfer Details (indicate cue type and reason): stood to urinate Toileting- Architect and Hygiene: Total assistance Toileting - Clothing Manipulation Details (indicate cue type and reason): min A +2 sit<>stand Functional mobility during ADLs: Min guard, Rolling walker (2 wheels) General ADL Comments: moving well no lob noted.   Cognition: Cognition Overall Cognitive Status:  (NT formally) Arousal/Alertness: Awake/alert Orientation Level: Oriented X4 Year: 2023 Day of Week: Incorrect (off by 1 day) Attention: Sustained Sustained Attention: Appears intact Memory: Impaired Memory Impairment: Retrieval deficit Awareness: Appears intact (need to assess further) Problem Solving: Impaired Problem Solving Impairment: Functional basic Safety/Judgment: Impaired Cognition Arousal/Alertness: Awake/alert Behavior During Therapy: WFL for tasks assessed/performed Overall Cognitive Status:  (NT formally) Area of Impairment: Following commands, Safety/judgement, Awareness, Problem solving, Attention Orientation Level: Situation Current Attention Level: Selective (divided attention limited.) Following Commands: Follows one step commands consistently Safety/Judgement: Decreased awareness of safety, Decreased awareness of deficits Awareness: Emergent Problem Solving: Slow processing, Difficulty sequencing, Requires verbal cues, Requires tactile cues General Comments: Pt unable  to fully explain what is bothering him at times, stating "my elbow" and then when asked to explain further he stated "I just forget about it due to the stroke". Pt reporting he re-arranged his room through walking around holding onto objects last night without assistance, stating he knows he needs to be careful as others in his family have had strokes. Pt aware of obstacles at times, but did not try to avoid it, stating "I thought you would help me". Unable to dual-task, stopping while counting backwards by 2s often, counting "50, 48, 47, 46, mumble, 42".   Physical Exam: Blood pressure (!) 156/96, pulse 61, temperature 98 F (36.7 C), temperature source Oral, resp. rate 17, SpO2 100 %. Physical Exam Constitutional:      Appearance: Normal appearance.  HENT:     Head: Normocephalic and atraumatic.  Cardiovascular:     Rate and Rhythm: Normal rate and regular rhythm.  Pulmonary:     Effort: Pulmonary effort is normal.     Breath sounds: Normal breath sounds.  Abdominal:     General: Bowel sounds are normal.     Palpations: Abdomen is soft.  Neurological:     Mental Status: He is alert and oriented to person, place, and time.  Psychiatric:        Mood and Affect: Mood normal.        Behavior: Behavior normal.        Lab Results Last 48 Hours  No results found for this or any previous visit (from the past 48 hour(s)).   Imaging Results (  Last 48 hours)  No results found.         Blood pressure (!) 156/96, pulse 61, temperature 98 F (36.7 C), temperature source Oral, resp. rate 17, SpO2 100 %.2D echo ejection fraction 60 to 65% with no LVH A1c 5.3 LDL 91 Review of Systems  Constitutional:  Negative for chills and fever.  HENT:  Negative for congestion and hearing loss.   Eyes:  Negative for blurred vision and double vision.  Respiratory:  Positive for cough. Negative for sputum production.   Cardiovascular:  Negative for chest pain and palpitations.  Gastrointestinal:   Negative for constipation, nausea and vomiting.  Genitourinary:  Positive for urgency. Negative for dysuria.  Neurological:  Negative for dizziness and headaches.        Past Medical History:  Diagnosis Date   Asthma     COPD (chronic obstructive pulmonary disease) (HCC)     Hidradenitis suppurativa      diagnosed in [facility] ED based on history and physical exam   Hypertension           Past Surgical History:  Procedure Laterality Date   IR CT HEAD LTD   05/10/2022   IR FLUORO GUIDED NEEDLE PLC ASPIRATION/INJECTION LOC   03/20/2022   IR PERCUTANEOUS ART THROMBECTOMY/INFUSION INTRACRANIAL INC DIAG ANGIO   05/10/2022   IR US GUIDE VASC ACCESS RIGHT   05/10/2022   RADIOLOGY WITH ANESTHESIA N/A 05/10/2022    Procedure: IR WITH ANESTHESIA;  Surgeon: Julieanne Cotton, MD;  Location: [facility] OR;  Service: Radiology;  Laterality: N/A;   TEE WITHOUT CARDIOVERSION N/A 03/11/2022    Procedure: TRANSESOPHAGEAL ECHOCARDIOGRAM (TEE);  Surgeon: Lamar Blinks, MD;  Location: [facility] ORS;  Service: Cardiovascular;  Laterality: N/A;    No family history on file. Social History:  reports that he has been smoking cigarettes. He has been smoking an average of .5 packs per day. He has never used smokeless tobacco. He reports current alcohol use. He reports current drug use. Drug: Marijuana. Allergies: No Known Allergies       Medications Prior to Admission  Medication Sig Dispense Refill   amLODipine (NORVASC) 10 MG tablet Take 1 tablet (10 mg total) by mouth once daily. 30 tablet 3   amoxicillin-clavulanate (AUGMENTIN) 875-125 MG tablet Take 1 tablet by mouth 2 (two) times daily. (Patient taking differently: Take 1 tablet by mouth 2 (two) times daily. 10 day course. Pt has 3 tablets left.) 20 tablet 0   metoprolol succinate (TOPROL-XL) 25 MG 24 hr tablet Take 1 tablet (25 mg total) by mouth once daily. 30 tablet 2   aspirin 81 MG chewable tablet Chew 1 tablet (81 mg total) by mouth once daily. (Patient not taking:  Reported on 05/11/2022) 90 tablet 0   atorvastatin (LIPITOR) 80 MG tablet Take 1 tablet (80 mg total) by mouth once daily. (Patient not taking: Reported on 05/11/2022) 90 tablet 0   clopidogrel (PLAVIX) 75 MG tablet Take 1 tablet (75 mg total) by mouth once daily. (Patient not taking: Reported on 05/11/2022) 90 tablet 0   HYDROcodone-acetaminophen (NORCO/VICODIN) 5-325 MG tablet Take 1 tablet by mouth every 6 (six) hours as needed for moderate pain. (Patient not taking: Reported on 05/11/2022) 15 tablet 0          Home: Home Living Family/patient expects to be discharged to:: Private residence Living Arrangements: Other relatives (brother) Available Help at Discharge: Family, Friend(s), Available PRN/intermittently Type of Home: Apartment Home Access: Stairs to enter [employer] of Steps:  5 Entrance Stairs-Rails: Can reach both Home Layout: One level Bathroom Shower/Tub: Tub/shower unit, Engineer, building services: Standard Bathroom Accessibility: Yes Home Equipment: BSC/3in1, Shower seat, Grab bars - tub/shower, Cane - single point, Rollator (4 wheels), Other (comment) Additional Comments: pt reports living in an apartment, notes earlier in admission reports the pt came from a group home  Lives With: Other (Comment), Family (brother)   Functional History: Prior Function Prior Level of Function : Needs assist Mobility Comments: pt reports he was ambulating with use of a cane. Pt does not drive, friend takes him when transportation is needed ADLs Comments: pt reports independence with ADLs, utilizes shower seat   Functional Status:  Mobility: Bed Mobility Overal bed mobility: Needs Assistance Bed Mobility: Supine to Sit Rolling: Supervision Sidelying to sit: Supervision Supine to sit: Supervision, HOB elevated General bed mobility comments: Supervision for safety Transfers Overall transfer level: Needs assistance Equipment used: Rolling walker (2 wheels) Transfers: Sit  to/from Stand, Bed to chair/wheelchair/BSC Sit to Stand: Min guard Bed to/from chair/wheelchair/BSC transfer type:: Step pivot Step pivot transfers: Min guard General transfer comment: cues for posture correction during ambulation tends to stay flexed and head down Ambulation/Gait Ambulation/Gait assistance: Min assist, Min guard Gait Distance (Feet): 280 Feet Assistive device: Rolling walker (2 wheels) Gait Pattern/deviations: Step-through pattern, Decreased stride length, Trunk flexed, Wide base of support General Gait Details: Worked on correcting posture, gait step length and speed.  Pt still mildly antalgic, stooped and slower than age appropriate. Gait velocity: reduced Gait velocity interpretation: <1.8 ft/sec, indicate of risk for recurrent falls   ADL: ADL Overall ADL's : Needs assistance/impaired Eating/Feeding: Sitting, Minimal assistance Grooming: Wash/dry hands, Wash/dry face, Oral care, Standing, Supervision/safety Grooming Details (indicate cue type and reason): standing at sink with RW Upper Body Bathing: Sitting, Minimal assistance Lower Body Bathing: Min guard, Sit to/from stand Lower Body Bathing Details (indicate cue type and reason): pt. stood to wash peri areas/buttocks Upper Body Dressing : Moderate assistance, Sitting Lower Body Dressing: Total assistance Lower Body Dressing Details (indicate cue type and reason): min A +2 sit<>stand Toilet Transfer: Min guard, Rolling walker (2 wheels), Ambulation, Regular Toilet Toilet Transfer Details (indicate cue type and reason): stood to urinate Toileting- Architect and Hygiene: Total assistance Toileting - Clothing Manipulation Details (indicate cue type and reason): min A +2 sit<>stand Functional mobility during ADLs: Min guard, Rolling walker (2 wheels) General ADL Comments: moving well no lob noted.   Cognition: Cognition Overall Cognitive Status:  (NT formally) Arousal/Alertness:  Awake/alert Orientation Level: Oriented X4 Year: 2023 Day of Week: Incorrect (off by 1 day) Attention: Sustained Sustained Attention: Appears intact Memory: Impaired Memory Impairment: Retrieval deficit Awareness: Appears intact (need to assess further) Problem Solving: Impaired Problem Solving Impairment: Functional basic Safety/Judgment: Impaired Cognition Arousal/Alertness: Awake/alert Behavior During Therapy: WFL for tasks assessed/performed Overall Cognitive Status:  (NT formally) Area of Impairment: Following commands, Safety/judgement, Awareness, Problem solving, Attention Orientation Level: Situation Current Attention Level: Selective (divided attention limited.) Following Commands: Follows one step commands consistently Safety/Judgement: Decreased awareness of safety, Decreased awareness of deficits Awareness: Emergent Problem Solving: Slow processing, Difficulty sequencing, Requires verbal cues, Requires tactile cues General Comments: Pt unable to fully explain what is bothering him at times, stating "my elbow" and then when asked to explain further he stated "I just forget about it due to the stroke". Pt reporting he re-arranged his room through walking around holding onto objects last night without assistance, stating he knows he needs to be careful as  others in his family have had strokes. Pt aware of obstacles at times, but did not try to avoid it, stating "I thought you would help me". Unable to dual-task, stopping while counting backwards by 2s often, counting "50, 48, 47, 46, mumble, 42".   Physical Exam: Blood pressure (!) 156/96, pulse 61, temperature 98 F (36.7 C), temperature source Oral, resp. rate 17, SpO2 100 %. Physical Exam    General: Alert and oriented x 3, No apparent distress HEENT: Head is normocephalic, atraumatic, PERRLA, EOMI, sclera anicteric, oral mucosa pink and moist Neck: Supple without JVD or lymphadenopathy Heart: Reg rate and rhythm. No  murmurs rubs or gallops Chest: CTA bilaterally without wheezes, rales, or rhonchi; no distress Abdomen: Soft, non-tender, non-distended, bowel sounds positive. Extremities: No clubbing, cyanosis, or edema. Pulses are 2+ Psych: Pt's affect is appropriate. Pt is cooperative Skin: Clean and intact without signs of breakdown. hidradenitis suppurativa in her groin area Neuro: Cranial nerves II through XII intact.  Speech appears to be fluent, slight hesitancy.  No aphasia noted.  Memory -patient was able to recall 1 out of 3 words after a few minutes, follows commands and answers questions, able to repeat.  Sensation intact to light touch in bilateral upper and lower extremities.  Strength 5 out of 5 in left upper extremity and left lower extremity. Strength 4+ out of 5 throughout right upper extremity.  Strength 4 out of 5 proximal right lower extremity, 4+ out of 5 distal right lower extremity.  Coordination appears to be normal. Musculoskeletal: No range of motion deficits noted.  Muscle bulk and tone normal.  No joint swelling or tenderness noted.  PIV in right upper extremity.        Lab Results Last 48 Hours  No results found for this or any previous visit (from the past 48 hour(s)).   Imaging Results (Last 48 hours)  No results found.         Blood pressure (!) 156/96, pulse 61, temperature 98 F (36.7 C), temperature source Oral, resp. rate 17, SpO2 100 %.  Medical Problem List and Plan: 1. 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  -patient may shower  -ELOS/Goals: 5-7 days, Mod I with PT/OT/SLP 2.  Antithrombotics: -DVT/anticoagulation:  Mechanical: Sequential compression devices, below knee Bilateral lower extremities  -antiplatelet therapy: Plavix and aspirin for 3 weeks followed by Plavix alone 3. Pain Management: Tramadol, Tylenol as needed 4. Mood: LCSW to evaluate and  provide emotional support  -antipsychotic agents: n/a 5. Neuropsych: This patient is capable of making decisions on his own behalf. 6. Skin/Wound Care: Routine skin care checks  -- Hidradenitis R groin: Local wound care; follow-up PCP  -On Vanc and zosyn for 48 hours, consider de-escalation to augemtin 7. Fluids/Electrolytes/Nutrition: Routine ins and outs and follow-up chemistries 8: Hypertension: Norvasc 10 mg daily initiated post Cleviprex, Toprol-xl 25mg  -Has had a few mildly elevated BPs, continue to monitor trend 9: Hyperlipidemia: Continue atorvastatin 80 mg daily 10: Tobacco use: Cessation counseling -Nicotine patch 11: Alcohol abuse: Cessation counseling -Thiamine supplementation, multivitamin, folic acid 12: THC use: Advised regarding stroke risk 13: Obesity: Dietary recommendations   Milinda AntisSandra J Setzer, PA-C 05/16/2022   I have personally performed a face to face diagnostic evaluation of this patient and formulated the key components of the plan.  Additionally, I have personally reviewed laboratory data, imaging studies, as well as relevant notes and concur with the  physician assistant's documentation above.  The patient's status has not changed from the original H&P.  Any changes in documentation from the acute care chart have been noted above.  Fanny Dance, MD, [facility]   Fanny Dance, MD 05/16/2022

## 2022-05-16 NOTE — H&P (Incomplete)
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  His brother noticed right facial droop and decreased level of consciousness.  EMS was called and he was taken to [facility] emergency department on 05/10/2022.  Code stroke called and Dr. Otelia Limes evaluated the patient.  History taken from the patient's brother, [name].  He stated the patient had prior history of several strokes and is at his baseline neurologically at the time of onset of symptoms.  He presents with right-sided weakness.  CT of head without acute intracranial abnormality.  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  CT perfusion with a 4 cc cute core infarct at the anterior left frontal lobe with a large surrounding ischemic penumbra.  No absolute contraindications tenecteplase and this was discussed extensively with the patient's brother.  Administered at 2022 hrs.  Neuro IR, Dr. Tommie Sams consulted.  Code IR called out to [facility] and the patient was transferred to [facility].  He underwent diagnostic cerebral angiogram with mechanical thrombectomy later that evening.  Transferred to intensive care unit.  On 6/4, he passed his swallow evaluation and p.o. medications including Norvasc, metoprolol and atorvastatin were resumed.  Critical care medicine consulted on 6/4 due to onset of agitation suspected alcohol withdrawal.  CIWA protocol initiated.  On 6/5, the patient was alert and oriented and able to give clear and coherent history.  No aphasia or neglect.  His speech is fluent with only slight word hesitancy.  Right upper extremity and right lower extremity strength 4/5.   He was transferred out of the care unit on 6/6.  He will be maintained on aspirin 81 mg daily and Plavix 75 mg daily for 3 weeks followed by Plavix alone.  VTE prophylaxis SCDs.  Tolerating heart healthy diet.  The patient requires inpatient medicine and rehabilitation evaluations and services for ongoing dysfunction secondary to scattered small acute infarcts in the left cerebral hemisphere and right posterior lentiform nucleus.  2D echo ejection fraction 60 to 65% with no LVH A1c 5.3 LDL 91 Review of Systems  Constitutional:  Negative for chills and fever.  HENT:  Negative for congestion and hearing loss.   Eyes:  Negative for blurred vision and double vision.  Respiratory:  Positive for cough. Negative for sputum production.   Cardiovascular:  Negative for chest pain and palpitations.  Gastrointestinal:  Negative for constipation, nausea and vomiting.  Genitourinary:  Positive for urgency. Negative for dysuria.  Neurological:  Negative for dizziness and headaches.   Past Medical History:  Diagnosis Date   Asthma    COPD (chronic obstructive pulmonary disease) (HCC)    Hidradenitis suppurativa    diagnosed in [facility] ED based on history and physical exam   Hypertension    Past Surgical History:  Procedure Laterality Date   IR CT HEAD LTD  05/10/2022   IR FLUORO GUIDED NEEDLE PLC ASPIRATION/INJECTION LOC  03/20/2022   IR PERCUTANEOUS ART THROMBECTOMY/INFUSION INTRACRANIAL INC DIAG ANGIO  05/10/2022   IR US GUIDE VASC ACCESS RIGHT  05/10/2022   RADIOLOGY WITH ANESTHESIA N/A 05/10/2022   Procedure: IR WITH ANESTHESIA;  Surgeon: Julieanne Cotton, MD;  Location: [facility] OR;  Service: Radiology;  Laterality: N/A;   TEE WITHOUT CARDIOVERSION N/A 03/11/2022   Procedure: TRANSESOPHAGEAL ECHOCARDIOGRAM (TEE);  Surgeon: Lamar Blinks, MD;  Location: [facility] ORS;  Service: Cardiovascular;  Laterality: N/A;   No family history on file. Social History:  reports that he has been smoking cigarettes. He has been  smoking an average of .5 packs per day. He has never used smokeless tobacco. He reports current alcohol use. He reports current drug use. Drug: Marijuana. Allergies: No Known Allergies Medications Prior to Admission  Medication Sig Dispense Refill   amLODipine (NORVASC) 10 MG tablet Take 1 tablet (10 mg total) by mouth once daily. 30 tablet 3   amoxicillin-clavulanate (AUGMENTIN) 875-125 MG tablet Take 1 tablet by mouth 2 (two) times daily. (Patient taking differently: Take 1 tablet by mouth 2 (two) times daily. 10 day course. Pt has 3 tablets left.) 20 tablet 0   metoprolol succinate (TOPROL-XL) 25 MG 24 hr tablet Take 1 tablet (25 mg total) by mouth once daily. 30 tablet 2   aspirin 81 MG chewable tablet Chew 1 tablet (81 mg total) by mouth once daily. (Patient not taking: Reported on 05/11/2022) 90 tablet 0   atorvastatin (LIPITOR) 80 MG tablet Take 1 tablet (80 mg total) by mouth once daily. (Patient not taking: Reported on 05/11/2022) 90 tablet 0   clopidogrel (PLAVIX) 75 MG tablet Take 1 tablet (75 mg total) by mouth once daily. (Patient not taking: Reported on 05/11/2022) 90 tablet 0   HYDROcodone-acetaminophen (NORCO/VICODIN) 5-325 MG tablet Take 1 tablet by mouth every 6 (six) hours as needed for moderate pain. (Patient not taking: Reported on 05/11/2022) 15 tablet 0      Home: Home Living Family/patient expects to be discharged to:: Private residence Living Arrangements: Other relatives (brother) Available Help at Discharge: Family, Friend(s), Available PRN/intermittently Type of Home: Apartment Home Access: Stairs to enter Secretary/administrator of Steps: 5 Entrance Stairs-Rails: Can reach both Home Layout: One level Bathroom Shower/Tub: Tub/shower unit, Engineer, building services: Pharmacist, community: Yes Home Equipment: BSC/3in1, Shower seat, Grab bars - tub/shower, Cane - single point, Rollator (4 wheels), Other (comment) Additional Comments: pt reports living in an  apartment, notes earlier in admission reports the pt came from a group home  Lives With: Other (Comment), Family (brother)   Functional History: Prior Function Prior Level of Function : Needs assist Mobility Comments: pt reports he was ambulating with use of a cane. Pt does not drive, friend takes him when transportation is needed ADLs Comments: pt reports independence with ADLs, utilizes shower seat  Functional Status:  Mobility: Bed Mobility Overal bed mobility: Needs Assistance Bed Mobility: Supine to Sit Rolling: Supervision Sidelying to sit: Supervision Supine to sit: Supervision, HOB elevated General bed mobility comments: Supervision for safety Transfers Overall transfer level: Needs assistance Equipment used: Rolling walker (2 wheels) Transfers: Sit to/from Stand, Bed to chair/wheelchair/BSC Sit to Stand: Min guard Bed to/from chair/wheelchair/BSC transfer type:: Step pivot Step pivot transfers: Min guard General transfer comment: cues for posture correction during ambulation tends to stay flexed and head down Ambulation/Gait Ambulation/Gait assistance: Min assist, Min guard Gait Distance (Feet): 280 Feet Assistive device: Rolling walker (2 wheels) Gait Pattern/deviations: Step-through pattern, Decreased stride length, Trunk flexed, Wide base of support General Gait Details: Worked on correcting posture, gait step length and speed.  Pt still mildly antalgic, stooped and slower than age appropriate. Gait velocity: reduced Gait velocity interpretation: <1.8 ft/sec, indicate of risk for recurrent falls    ADL: ADL  Overall ADL's : Needs assistance/impaired Eating/Feeding: Sitting, Minimal assistance Grooming: Wash/dry hands, Wash/dry face, Oral care, Standing, Supervision/safety Grooming Details (indicate cue type and reason): standing at sink with RW Upper Body Bathing: Sitting, Minimal assistance Lower Body Bathing: Min guard, Sit to/from stand Lower Body Bathing  Details (indicate cue type and reason): pt. stood to wash peri areas/buttocks Upper Body Dressing : Moderate assistance, Sitting Lower Body Dressing: Total assistance Lower Body Dressing Details (indicate cue type and reason): min A +2 sit<>stand Toilet Transfer: Min guard, Rolling walker (2 wheels), Ambulation, Regular Toilet Toilet Transfer Details (indicate cue type and reason): stood to urinate Toileting- Architect and Hygiene: Total assistance Toileting - Clothing Manipulation Details (indicate cue type and reason): min A +2 sit<>stand Functional mobility during ADLs: Min guard, Rolling walker (2 wheels) General ADL Comments: moving well no lob noted.  Cognition: Cognition Overall Cognitive Status:  (NT formally) Arousal/Alertness: Awake/alert Orientation Level: Oriented X4 Year: 2023 Day of Week: Incorrect (off by 1 day) Attention: Sustained Sustained Attention: Appears intact Memory: Impaired Memory Impairment: Retrieval deficit Awareness: Appears intact (need to assess further) Problem Solving: Impaired Problem Solving Impairment: Functional basic Safety/Judgment: Impaired Cognition Arousal/Alertness: Awake/alert Behavior During Therapy: WFL for tasks assessed/performed Overall Cognitive Status:  (NT formally) Area of Impairment: Following commands, Safety/judgement, Awareness, Problem solving, Attention Orientation Level: Situation Current Attention Level: Selective (divided attention limited.) Following Commands: Follows one step commands consistently Safety/Judgement: Decreased awareness of safety, Decreased awareness of deficits Awareness: Emergent Problem Solving: Slow processing, Difficulty sequencing, Requires verbal cues, Requires tactile cues General Comments: Pt unable to fully explain what is bothering him at times, stating "my elbow" and then when asked to explain further he stated "I just forget about it due to the stroke". Pt reporting he  re-arranged his room through walking around holding onto objects last night without assistance, stating he knows he needs to be careful as others in his family have had strokes. Pt aware of obstacles at times, but did not try to avoid it, stating "I thought you would help me". Unable to dual-task, stopping while counting backwards by 2s often, counting "50, 48, 47, 46, mumble, 42".  Physical Exam: Blood pressure (!) 156/96, pulse 61, temperature 98 F (36.7 C), temperature source Oral, resp. rate 17, SpO2 100 %. Physical Exam Constitutional:      Appearance: Normal appearance.  HENT:     Head: Normocephalic and atraumatic.  Cardiovascular:     Rate and Rhythm: Normal rate and regular rhythm.  Pulmonary:     Effort: Pulmonary effort is normal.     Breath sounds: Normal breath sounds.  Abdominal:     General: Bowel sounds are normal.     Palpations: Abdomen is soft.  Neurological:     Mental Status: He is alert and oriented to person, place, and time.  Psychiatric:        Mood and Affect: Mood normal.        Behavior: Behavior normal.     No results found for this or any previous visit (from the past 48 hour(s)). No results found.    Blood pressure (!) 156/96, pulse 61, temperature 98 F (36.7 C), temperature source Oral, resp. rate 17, SpO2 100 %.  Medical Problem List and Plan: 1. 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  -patient may *** shower  -ELOS/Goals: *** 2.  Antithrombotics: -DVT/anticoagulation:  Mechanical: Sequential compression devices, below knee  Bilateral lower extremities  -antiplatelet therapy: Plavix and aspirin for 3 weeks followed by Plavix alone 3. Pain Management: Tylenol as needed 4. Mood: LCSW to evaluate and provide emotional support  -antipsychotic agents: n/a 5. Neuropsych: This patient is capable of making decisions on his own behalf. 6.  Skin/Wound Care: Routine skin care checks  -- Hidradenitis: Local wound care; follow-up PCP 7. Fluids/Electrolytes/Nutrition: Routine ins and outs and follow-up chemistries 8: Hypertension: Norvasc 10 mg daily initiated post Cleviprex 9: Hyperlipidemia: Continue atorvastatin 80 mg daily 10: Tobacco use: Cessation counseling; requests nicotine patch 11: Alcohol abuse: Cessation counseling 12: THC use: Advised regarding stroke risk 13: Obesity: Dietary recommendations     ***  Milinda AntisSandra J Kendyl Festa, PA-C 05/16/2022

## 2022-05-16 NOTE — Hospital Course (Signed)
James Lambert is a 56 year old male with PMH recurrent CVAs with residual right-sided weakness, HTN, COPD, hidradenitis suppurativa.  He had been last hospitalized in April 2023 after developing right arm weakness and found to have acute CVA showing patchy multifocal acute ischemic infarcts bilaterally with concern for embolic phenomenon. He presented again later in April 2023 with further arm weakness/paresthesias on the right and was found to have new small acute infarct involving left subinsular white matter. This hospitalization he presented with acute onset confusion and aphasia. MRI brain showed small acute infarcts in the left cerebral hemisphere and acute infarct in the right posterior lentiform nucleus.  CT angio head/neck showed large vessel occlusion involving proximal cervical left ICA just distal to the bifurcation. He underwent thrombectomy on 05/12/2022 with recannulization of the left ICA achieved.  He required IV antibiotics during previous hospitalizations for his hidradenitis suppurativa.  He responds well to broad treatment with de-escalation last hospitalization down to Augmentin to complete course. He began developing pain in his left groin over an area of prior hidradenitis flaring; this had some induration and tenderness with possible purulent drainage.  Hospitalist was consulted for treatment and management.

## 2022-05-16 NOTE — Assessment & Plan Note (Signed)
-   Last hospitalization in April he had worsening of his sacrum/perineum requiring IV antibiotics which were transitioned to Augmentin at discharge.  At that time he was evaluated by general surgery and was not considered a surgical candidate due to his acute CVA at that time.  He would again not be a surgical candidate due to recurrent acute CVA this hospitalization as well - He is afebrile and has no leukocytosis, although he did not develop leukocytosis in April either. -Site this time is his right inner groin noted to have induration, mild tenderness, foul odor, questionable drainage but no fluctuance appreciated.  Do not think he warrants ultrasound or further imaging at this time. -will treat with vancomycin and Zosyn for approximately 48 hours then pending clinical response, de-escalate back down to Augmentin to complete course

## 2022-05-16 NOTE — Discharge Summary (Addendum)
Stroke Discharge Summary  Patient ID: James Lambert   MRN: [id number]      DOB: 1966/06/15  Date of Admission: 05/10/2022 Date of Discharge: 05/16/2022  Attending Physician:  Stroke, Md, MD, Stroke MD Consultant(s):  Treatment Team:  Dwyane Dee, MD pulmonary/intensive care and hospitalist Patient's PCP:  Pcp, No  Discharge Diagnoses: Scattered small acute infarcts in the left cerebral hemisphere and right posterior lentiform nucleus secondary to left ICA occlusion s/p IV TNK and successful thrombectomy with TICI3 revascularization Principal Problem:   Seizure cerebral [facility]) Active Problems:   Hidradenitis suppurativa Right hemiparesis Right leg stiffness and pain   Medications to be continued on Rehab Allergies as of 05/16/2022   No Known Allergies      Medication List     STOP taking these medications    amoxicillin-clavulanate 875-125 MG tablet Commonly known as: AUGMENTIN   aspirin 81 MG chewable tablet Replaced by: aspirin EC 81 MG tablet   HYDROcodone-acetaminophen 5-325 MG tablet Commonly known as: NORCO/VICODIN       TAKE these medications    amLODipine 10 MG tablet Commonly known as: NORVASC Take 1 tablet (10 mg total) by mouth once daily.   aspirin EC 81 MG tablet Take 1 tablet (81 mg total) by mouth daily. Swallow whole. Start taking on: May 17, 2022 Replaces: aspirin 81 MG chewable tablet   atorvastatin 80 MG tablet Commonly known as: LIPITOR Take 1 tablet (80 mg total) by mouth once daily.   Baclofen 5 MG Tabs Take 5 mg by mouth 3 (three) times daily.   clopidogrel 75 MG tablet Commonly known as: PLAVIX Take 1 tablet (75 mg total) by mouth daily. Start taking on: May 17, 2296   folic acid 1 MG tablet Commonly known as: FOLVITE Take 1 tablet (1 mg total) by mouth daily. Start taking on: May 17, 2022   metoprolol succinate 25 MG 24 hr tablet Commonly known as: TOPROL-XL Take 1 tablet (25 mg total) by mouth daily. Start taking  on: May 17, 2022   multivitamin with minerals Tabs tablet Take 1 tablet by mouth daily. Start taking on: May 17, 2022   pantoprazole 40 MG tablet Commonly known as: PROTONIX Take 1 tablet (40 mg total) by mouth at bedtime.   piperacillin-tazobactam 3.375 GM/50ML IVPB Commonly known as: ZOSYN Inject 50 mLs (3.375 g total) into the vein every 8 (eight) hours.   senna-docusate 8.6-50 MG tablet Commonly known as: Senokot-S Take 1 tablet by mouth 2 (two) times daily.   thiamine 100 MG tablet Take 1 tablet (100 mg total) by mouth daily. Start taking on: May 17, 2022   vancomycin 1-5 GM/200ML-% Soln Commonly known as: VANCOCIN Inject 200 mLs (1,000 mg total) into the vein every 12 (twelve) hours. Start taking on: May 17, 2022        LABORATORY STUDIES CBC    Component Value Date/Time   WBC 9.7 05/11/2022 0249   RBC 4.13 (L) 05/11/2022 0249   HGB 12.8 (L) 05/11/2022 0249   HCT 37.6 (L) 05/11/2022 0249   PLT 394 05/11/2022 0249   MCV 91.0 05/11/2022 0249   MCH 31.0 05/11/2022 0249   MCHC 34.0 05/11/2022 0249   RDW 13.7 05/11/2022 0249   LYMPHSABS 2.2 05/10/2022 2006   MONOABS 0.7 05/10/2022 2006   EOSABS 0.4 05/10/2022 2006   BASOSABS 0.0 05/10/2022 2006   CMP    Component Value Date/Time   NA 131 (L) 05/11/2022 0249   K 3.8  05/11/2022 0249   CL 101 05/11/2022 0249   CO2 20 (L) 05/11/2022 0249   GLUCOSE 171 (H) 05/11/2022 0249   BUN 7 05/11/2022 0249   CREATININE 1.04 05/11/2022 0249   CALCIUM 8.8 (L) 05/11/2022 0249   PROT 8.1 05/11/2022 0249   ALBUMIN 3.3 (L) 05/11/2022 0249   AST 12 (L) 05/11/2022 0249   ALT 10 05/11/2022 0249   ALKPHOS 74 05/11/2022 0249   BILITOT 0.2 (L) 05/11/2022 0249   GFRNONAA >60 05/11/2022 0249   GFRAA >60 06/04/2020 1522   COAGS Lab Results  Component Value Date   INR 1.1 05/10/2022   INR 1.2 03/08/2022   INR 1.0 01/02/2020   Lipid Panel    Component Value Date/Time   CHOL 140 05/11/2022 0249   TRIG 87 05/11/2022  0249   HDL 32 (L) 05/11/2022 0249   CHOLHDL 4.4 05/11/2022 0249   VLDL 17 05/11/2022 0249   LDLCALC 91 05/11/2022 0249   HgbA1C  Lab Results  Component Value Date   HGBA1C 5.3 03/19/2022   Urinalysis    Component Value Date/Time   COLORURINE YELLOW (A) 03/18/2022 1501   APPEARANCEUR HAZY (A) 03/18/2022 1501   LABSPEC 1.021 03/18/2022 1501   PHURINE 5.0 03/18/2022 1501   GLUCOSEU NEGATIVE 03/18/2022 1501   HGBUR MODERATE (A) 03/18/2022 1501   BILIRUBINUR NEGATIVE 03/18/2022 1501   KETONESUR 20 (A) 03/18/2022 1501   PROTEINUR NEGATIVE 03/18/2022 1501   NITRITE NEGATIVE 03/18/2022 1501   LEUKOCYTESUR NEGATIVE 03/18/2022 1501   Urine Drug Screen     Component Value Date/Time   LABOPIA NONE DETECTED 05/11/2022 0405   COCAINSCRNUR NONE DETECTED 05/11/2022 0405   LABBENZ NONE DETECTED 05/11/2022 0405   AMPHETMU NONE DETECTED 05/11/2022 0405   THCU POSITIVE (A) 05/11/2022 0405   LABBARB NONE DETECTED 05/11/2022 0405    Alcohol Level    Component Value Date/Time   ETH <10 05/11/2022 0249     SIGNIFICANT DIAGNOSTIC STUDIES IR PERCUTANEOUS ART THROMBECTOMY/INFUSION INTRACRANIAL INC DIAG ANGIO  Result Date: 05/12/2022 INDICATION: Cerebrovascular accident (CVA) due to occlusion of left carotid artery ([place]) Z61.096 (ICD-10-CM). James Lambert is a 56 year old male presenting with aphasia and right-sided weakness. Has past medical history significant for HTN, COPD, hidradenitis suppurativa and stroke with residual right sided weakness. He was last known well at 6:30 p.m. on 05/10/2022. NIHSS at present the patient was 15; modified Rankin scale 1. Head CT showed no evidence of an acute infarct or hemorrhage. Patient received IV tPA and KA. CT angiogram of the head and neck showed occlusion of the intracranial left ICA. He was then transferred to our service for a diagnostic cerebral angiogram and mechanical thrombectomy. EXAM: ULTRASOUND-GUIDED VASCULAR ACCESS DIAGNOSTIC CEREBRAL  ANGIOGRAM MECHANICAL THROMBECTOMY COMPARISON:  CT/CT angiogram of the head and neck May 10, 2022. MEDICATIONS: No antibiotics administered. ANESTHESIA/SEDATION: The procedure was performed under general anesthesia. CONTRAST:  54 mL of Omnipaque 300 milligram/mL. FLUOROSCOPY: Radiation Exposure Index (as provided by the fluoroscopic device): 045 mGy Kerma COMPLICATIONS: None immediate. TECHNIQUE: Informed written consent was obtained from the patient's brother after a thorough discussion of the procedural risks, benefits and alternatives. All questions were addressed. Maximal Sterile Barrier Technique was utilized including caps, mask, sterile gowns, sterile gloves, sterile drape, hand hygiene and skin antiseptic. A timeout was performed prior to the initiation of the procedure. The right groin was prepped and draped in the usual sterile fashion. Using a micropuncture kit and the modified Seldinger technique, access was gained to the right  common femoral artery and an 8 French sheath was placed. Real-time ultrasound guidance was utilized for vascular access including the acquisition of a permanent ultrasound image documenting patency of the accessed vessel. Under fluoroscopy, a Zoom 88 guide catheter was navigated over a 6 [place] Berenstein 2 catheter and a 0.035" Terumo Glidewire into the aortic arch. The catheter was placed into the left common carotid artery and then advanced into the left internal carotid artery. The diagnostic catheter was removed. Frontal and lateral angiograms of the head were obtained. FINDINGS: 1. Normal caliber of the right common femoral artery, adequate for vascular access. 2. Occlusion of the left ICA likely at the cavernous segment given with retrograde filling via ophthalmic artery stopping abruptly at this segment level. 3. Atherosclerotic changes at the carotid bulbs without significant stenosis. 4. Delayed filling of the proximal cervical left ICA related to flow stagnation in the  cervical ICA due to intracranial occlusion. PROCEDURE: Using biplane roadmap, an Apro 070 aspiration catheter was navigated over an Aristotle 24 microguidewire into the upper cervical segment of the left ICA. The aspiration catheter was then advanced to the level of occlusion and connected to an aspiration pump. Continuous aspiration was performed for 2 minutes. The guide catheter was connected to a VacLok syringe. The aspiration catheter was subsequently removed under constant aspiration. The guide catheter was aspirated for debris. Left internal carotid artery angiograms with frontal and lateral views of the head showed complete recanalization of the left ICA with brisk contrast filling of the left MCA and ACA vascular tree. Atherosclerotic disease of the left ICA paraclinoid segment is noted with moderate to severe stenosis. Delayed left internal carotid artery angiograms showed stable recanalization with brisk contrast opacification of the left anterior circulation. Flat panel CT of the head was obtained and post processed in a separate workstation with concurrent attending physician supervision. Selected images were sent to PACS. No evidence of hemorrhagic complication. Right common femoral artery angiogram was obtained in right anterior oblique and lateral views. The puncture is at the level of the common femoral artery. The artery has normal caliber, adequate for closure device. The sheath was exchanged over the wire for a Perclose prostyle which was utilized for access closure. Immediate hemostasis was achieved. IMPRESSION: Successful mechanical thrombectomy with direct contact aspiration for treatment of an occlusion of the cavernous left ICA with complete recanalization (TICI 3). No evidence of thromboembolic or hemorrhagic complication. PLAN: Follow-up in office in 3 months after recovering from stroke. Electronically Signed   By: Pedro Earls M.D.   On: 05/12/2022 12:05   IR US Guide  Vasc Access Right  Result Date: 05/12/2022 INDICATION: Cerebrovascular accident (CVA) due to occlusion of left carotid artery ([place]) Q76.195 (ICD-10-CM). LYON DUMONT is a 56 year old male presenting with aphasia and right-sided weakness. Has past medical history significant for HTN, COPD, hidradenitis suppurativa and stroke with residual right sided weakness. He was last known well at 6:30 p.m. on 05/10/2022. NIHSS at present the patient was 15; modified Rankin scale 1. Head CT showed no evidence of an acute infarct or hemorrhage. Patient received IV tPA and KA. CT angiogram of the head and neck showed occlusion of the intracranial left ICA. He was then transferred to our service for a diagnostic cerebral angiogram and mechanical thrombectomy. EXAM: ULTRASOUND-GUIDED VASCULAR ACCESS DIAGNOSTIC CEREBRAL ANGIOGRAM MECHANICAL THROMBECTOMY COMPARISON:  CT/CT angiogram of the head and neck May 10, 2022. MEDICATIONS: No antibiotics administered. ANESTHESIA/SEDATION: The procedure was performed under general anesthesia. CONTRAST:  54 mL of Omnipaque 300 milligram/mL. FLUOROSCOPY: Radiation Exposure Index (as provided by the fluoroscopic device): 119 mGy Kerma COMPLICATIONS: None immediate. TECHNIQUE: Informed written consent was obtained from the patient's brother after a thorough discussion of the procedural risks, benefits and alternatives. All questions were addressed. Maximal Sterile Barrier Technique was utilized including caps, mask, sterile gowns, sterile gloves, sterile drape, hand hygiene and skin antiseptic. A timeout was performed prior to the initiation of the procedure. The right groin was prepped and draped in the usual sterile fashion. Using a micropuncture kit and the modified Seldinger technique, access was gained to the right common femoral artery and an 8 French sheath was placed. Real-time ultrasound guidance was utilized for vascular access including the acquisition of a permanent ultrasound image  documenting patency of the accessed vessel. Under fluoroscopy, a Zoom 88 guide catheter was navigated over a 6 [place] Berenstein 2 catheter and a 0.035" Terumo Glidewire into the aortic arch. The catheter was placed into the left common carotid artery and then advanced into the left internal carotid artery. The diagnostic catheter was removed. Frontal and lateral angiograms of the head were obtained. FINDINGS: 1. Normal caliber of the right common femoral artery, adequate for vascular access. 2. Occlusion of the left ICA likely at the cavernous segment given with retrograde filling via ophthalmic artery stopping abruptly at this segment level. 3. Atherosclerotic changes at the carotid bulbs without significant stenosis. 4. Delayed filling of the proximal cervical left ICA related to flow stagnation in the cervical ICA due to intracranial occlusion. PROCEDURE: Using biplane roadmap, an Apro 070 aspiration catheter was navigated over an Aristotle 24 microguidewire into the upper cervical segment of the left ICA. The aspiration catheter was then advanced to the level of occlusion and connected to an aspiration pump. Continuous aspiration was performed for 2 minutes. The guide catheter was connected to a VacLok syringe. The aspiration catheter was subsequently removed under constant aspiration. The guide catheter was aspirated for debris. Left internal carotid artery angiograms with frontal and lateral views of the head showed complete recanalization of the left ICA with brisk contrast filling of the left MCA and ACA vascular tree. Atherosclerotic disease of the left ICA paraclinoid segment is noted with moderate to severe stenosis. Delayed left internal carotid artery angiograms showed stable recanalization with brisk contrast opacification of the left anterior circulation. Flat panel CT of the head was obtained and post processed in a separate workstation with concurrent attending physician supervision. Selected  images were sent to PACS. No evidence of hemorrhagic complication. Right common femoral artery angiogram was obtained in right anterior oblique and lateral views. The puncture is at the level of the common femoral artery. The artery has normal caliber, adequate for closure device. The sheath was exchanged over the wire for a Perclose prostyle which was utilized for access closure. Immediate hemostasis was achieved. IMPRESSION: Successful mechanical thrombectomy with direct contact aspiration for treatment of an occlusion of the cavernous left ICA with complete recanalization (TICI 3). No evidence of thromboembolic or hemorrhagic complication. PLAN: Follow-up in office in 3 months after recovering from stroke. Electronically Signed   By: Pedro Earls M.D.   On: 05/12/2022 12:05   IR CT Head Ltd  Result Date: 05/12/2022 INDICATION: Cerebrovascular accident (CVA) due to occlusion of left carotid artery ([place]) E17.408 (ICD-10-CM). KRISHAN MCBREEN is a 56 year old male presenting with aphasia and right-sided weakness. Has past medical history significant for HTN, COPD, hidradenitis suppurativa and stroke with residual right  sided weakness. He was last known well at 6:30 p.m. on 05/10/2022. NIHSS at present the patient was 15; modified Rankin scale 1. Head CT showed no evidence of an acute infarct or hemorrhage. Patient received IV tPA and KA. CT angiogram of the head and neck showed occlusion of the intracranial left ICA. He was then transferred to our service for a diagnostic cerebral angiogram and mechanical thrombectomy. EXAM: ULTRASOUND-GUIDED VASCULAR ACCESS DIAGNOSTIC CEREBRAL ANGIOGRAM MECHANICAL THROMBECTOMY COMPARISON:  CT/CT angiogram of the head and neck May 10, 2022. MEDICATIONS: No antibiotics administered. ANESTHESIA/SEDATION: The procedure was performed under general anesthesia. CONTRAST:  54 mL of Omnipaque 300 milligram/mL. FLUOROSCOPY: Radiation Exposure Index (as provided by the  fluoroscopic device): 354 mGy Kerma COMPLICATIONS: None immediate. TECHNIQUE: Informed written consent was obtained from the patient's brother after a thorough discussion of the procedural risks, benefits and alternatives. All questions were addressed. Maximal Sterile Barrier Technique was utilized including caps, mask, sterile gowns, sterile gloves, sterile drape, hand hygiene and skin antiseptic. A timeout was performed prior to the initiation of the procedure. The right groin was prepped and draped in the usual sterile fashion. Using a micropuncture kit and the modified Seldinger technique, access was gained to the right common femoral artery and an 8 French sheath was placed. Real-time ultrasound guidance was utilized for vascular access including the acquisition of a permanent ultrasound image documenting patency of the accessed vessel. Under fluoroscopy, a Zoom 88 guide catheter was navigated over a 6 [place] Berenstein 2 catheter and a 0.035" Terumo Glidewire into the aortic arch. The catheter was placed into the left common carotid artery and then advanced into the left internal carotid artery. The diagnostic catheter was removed. Frontal and lateral angiograms of the head were obtained. FINDINGS: 1. Normal caliber of the right common femoral artery, adequate for vascular access. 2. Occlusion of the left ICA likely at the cavernous segment given with retrograde filling via ophthalmic artery stopping abruptly at this segment level. 3. Atherosclerotic changes at the carotid bulbs without significant stenosis. 4. Delayed filling of the proximal cervical left ICA related to flow stagnation in the cervical ICA due to intracranial occlusion. PROCEDURE: Using biplane roadmap, an Apro 070 aspiration catheter was navigated over an Aristotle 24 microguidewire into the upper cervical segment of the left ICA. The aspiration catheter was then advanced to the level of occlusion and connected to an aspiration pump.  Continuous aspiration was performed for 2 minutes. The guide catheter was connected to a VacLok syringe. The aspiration catheter was subsequently removed under constant aspiration. The guide catheter was aspirated for debris. Left internal carotid artery angiograms with frontal and lateral views of the head showed complete recanalization of the left ICA with brisk contrast filling of the left MCA and ACA vascular tree. Atherosclerotic disease of the left ICA paraclinoid segment is noted with moderate to severe stenosis. Delayed left internal carotid artery angiograms showed stable recanalization with brisk contrast opacification of the left anterior circulation. Flat panel CT of the head was obtained and post processed in a separate workstation with concurrent attending physician supervision. Selected images were sent to PACS. No evidence of hemorrhagic complication. Right common femoral artery angiogram was obtained in right anterior oblique and lateral views. The puncture is at the level of the common femoral artery. The artery has normal caliber, adequate for closure device. The sheath was exchanged over the wire for a Perclose prostyle which was utilized for access closure. Immediate hemostasis was achieved. IMPRESSION: Successful mechanical thrombectomy with  direct contact aspiration for treatment of an occlusion of the cavernous left ICA with complete recanalization (TICI 3). No evidence of thromboembolic or hemorrhagic complication. PLAN: Follow-up in office in 3 months after recovering from stroke. Electronically Signed   By: Baldemar Lenis M.D.   On: 05/12/2022 12:05   ECHOCARDIOGRAM LIMITED  Result Date: 05/11/2022    ECHOCARDIOGRAM LIMITED REPORT   Patient Name:   ALVAH LAGROW Date of Exam: 05/11/2022 Medical Rec #:  [id number]      Height:       74.0 in Accession #:    [id number]     Weight:       235.7 lb Date of Birth:  06-08-1966      BSA:          2.330 m Patient Age:    55 years        BP:           142/90 mmHg Patient Gender: M              HR:           79 bpm. Exam Location:  Inpatient Procedure: Limited Echo, Limited Color Doppler and Cardiac Doppler Indications:    stroke  History:        Patient has prior history of Echocardiogram examinations, most                 recent 03/10/2022. COPD; Risk Factors:Current Smoker and                 Hypertension.  Sonographer:    Delcie Roch RDCS Referring Phys: [id number] HUNTER J COLLINS IMPRESSIONS  1. Left ventricular ejection fraction, by estimation, is 60 to 65%. The left ventricle has normal function. The left ventricle has no regional wall motion abnormalities.  2. Right ventricular systolic function is normal. The right ventricular size is normal. Tricuspid regurgitation signal is inadequate for assessing PA pressure.  3. The mitral valve is normal in structure. No evidence of mitral valve regurgitation.  4. The aortic valve is tricuspid. Aortic valve regurgitation is not visualized. Aortic valve sclerosis is present, with no evidence of aortic valve stenosis.  5. The inferior vena cava is normal in size with greater than 50% respiratory variability, suggesting right atrial pressure of 3 mmHg. FINDINGS  Left Ventricle: Left ventricular ejection fraction, by estimation, is 60 to 65%. The left ventricle has normal function. The left ventricle has no regional wall motion abnormalities. The left ventricular internal cavity size was normal in size. There is  no left ventricular hypertrophy. Right Ventricle: The right ventricular size is normal. No increase in right ventricular wall thickness. Right ventricular systolic function is normal. Tricuspid regurgitation signal is inadequate for assessing PA pressure. Left Atrium: Left atrial size was normal in size. Right Atrium: Right atrial size was normal in size. Pericardium: There is no evidence of pericardial effusion. Mitral Valve: The mitral valve is normal in structure. Aortic Valve: The aortic  valve is tricuspid. Aortic valve regurgitation is not visualized. Aortic valve sclerosis is present, with no evidence of aortic valve stenosis. Pulmonic Valve: The pulmonic valve was not well visualized. Pulmonic valve regurgitation is not visualized. Aorta: The aortic root is normal in size and structure. Venous: The inferior vena cava is normal in size with greater than 50% respiratory variability, suggesting right atrial pressure of 3 mmHg. LEFT VENTRICLE PLAX 2D LVIDd:         4.00 cm LVIDs:  2.80 cm LV PW:         1.10 cm LV IVS:        1.00 cm  IVC IVC diam: 2.00 cm LEFT ATRIUM         Index LA diam:    3.60 cm 1.54 cm/m  AORTIC VALVE LVOT Vmax:   145.00 cm/s LVOT Vmean:  96.600 cm/s LVOT VTI:    0.272 m MITRAL VALVE MV Area (PHT): 3.89 cm    SHUNTS MV Decel Time: 195 msec    Systemic VTI: 0.27 m MV E velocity: 89.40 cm/s MV A velocity: 87.50 cm/s MV E/A ratio:  1.02 Mihai Croitoru MD Electronically signed by Sanda Klein MD Signature Date/Time: 05/11/2022/4:09:30 PM    Final    MR BRAIN WO CONTRAST  Result Date: 05/11/2022 CLINICAL DATA:  Stroke, follow-up; status post neuro interventional procedure for left ICA occlusion EXAM: MRI HEAD WITHOUT CONTRAST TECHNIQUE: Multiplanar, multiecho pulse sequences of the brain and surrounding structures were obtained without intravenous contrast. COMPARISON:  03/18/2022 FINDINGS: Evaluation is somewhat limited by motion artifact. Brain: Scattered areas of restricted diffusion with ADC correlates in the left occipital cortex (series 5, image 73 76), posterior left temporal lobe (series 5, image 78), left parietal lobe (series 5, images 81-86 and 89-90), and left frontal lobe cortex and white matter (series 5, images 77, 80, 83, 87-95). Additional punctate focus of restricted diffusion in the right posterior lentiform nucleus (series 5, image 77). No acute hemorrhage, mass, mass effect, or midline shift. No hydrocephalus or extra-axial collection. Vascular:  Normal arterial flow voids. Skull and upper cervical spine: Normal marrow signal. Sinuses/Orbits: Mucosal thickening in the right maxillary sinus and left sphenoid sinus. The orbits are unremarkable. Other: The mastoids are well aerated. IMPRESSION: Evaluation is somewhat limited by motion artifact. Within this limitation, there are scattered small acute infarcts in the left cerebral hemisphere, with an additional punctate acute infarct in the right posterior lentiform nucleus. No evidence of hemorrhagic transformation. Electronically Signed   By: Merilyn Baba M.D.   On: 05/11/2022 14:13   CT ANGIO HEAD NECK W WO CM W PERF (CODE STROKE)  Result Date: 05/10/2022 CLINICAL DATA:  Initial evaluation for acute stroke. EXAM: CT ANGIOGRAPHY HEAD AND NECK CT PERFUSION BRAIN TECHNIQUE: Multidetector CT imaging of the head and neck was performed using the standard protocol during bolus administration of intravenous contrast. Multiplanar CT image reconstructions and MIPs were obtained to evaluate the vascular anatomy. Carotid stenosis measurements (when applicable) are obtained utilizing NASCET criteria, using the distal internal carotid diameter as the denominator. Multiphase CT imaging of the brain was performed following IV bolus contrast injection. Subsequent parametric perfusion maps were calculated using RAPID software. RADIATION DOSE REDUCTION: This exam was performed according to the departmental dose-optimization program which includes automated exposure control, adjustment of the mA and/or kV according to patient size and/or use of iterative reconstruction technique. CONTRAST:  148mL OMNIPAQUE IOHEXOL 350 MG/ML SOLN COMPARISON:  Prior head CT from earlier the same day. FINDINGS: CTA NECK FINDINGS Aortic arch: Visualized aortic arch normal in caliber with standard 3 vessel morphology. Mild plaque about the arch and origin the great vessels without significant stenosis. Right carotid system: Right CCA widely  patent. Calcified plaque about the right carotid bulb/proximal right ICA with associated stenosis of up to 55% by NASCET criteria. Right ICA patent distally without stenosis or dissection. Left carotid system: Left CCA patent from its origin to the bifurcation. There is acute occlusion of the proximal cervical  left ICA just distal to the bifurcation (series 7, image 216) left ICA remains occluded to the skull base. Vertebral arteries: Both vertebral arteries arise from subclavian arteries. No proximal subclavian artery stenosis. Vertebral arteries patent without stenosis or dissection. Skeleton: No discrete or worrisome osseous lesions. Other neck: No other acute soft tissue abnormality within the neck. Upper chest: Visualized upper chest demonstrates no acute finding. Review of the MIP images confirms the above findings CTA HEAD FINDINGS Anterior circulation: Scattered atheromatous change within the right carotid siphon with associated moderate to severe multifocal stenosis. Left ICA occluded at the skull base. Distal reconstitution at the supraclinoid segment. Probable small amount of thrombus/clot seen protruding into the lumen at the supraclinoid left ICA (series 8, image 135). A1 segments patent. Normal anterior communicating complex. Anterior cerebral arteries patent to their distal aspects without stenosis. M1 segments remain patent without stenosis or occlusion. No visible proximal MCA branch occlusion. Distal MCA branches remain patent. No visible significant downstream left MCA branch occlusion. Posterior circulation: Extensive atheromatous disease seen involving the V4 segments bilaterally with associated mild to moderate multifocal narrowing on the left with moderate to severe narrowing on the right. Left vertebral artery slightly dominant. Atheromatous disease extends into the basilar artery with associated moderate to severe multifocal stenoses involving the proximal and mid basilar artery. Basilar  remains patent to its distal aspect. Superior cerebral arteries patent bilaterally. Both PCAs primarily supplied basilar and remain patent to their distal aspects. Venous sinuses: Grossly patent allowing for timing the contrast bolus. Anatomic variants: None significant.  No aneurysm. Review of the MIP images confirms the above findings CT Brain Perfusion Findings: ASPECTS: 10. CBF (<30%) Volume: 46mL Perfusion (Tmax>6.0s) volume: 291mL Mismatch Volume: 264mL Infarction Location:Small acute core infarct present at the anterior left frontal lobe. Extremely large surrounding ischemic penumbra involving the majority of the left MCA distribution. IMPRESSION: 1. Positive CTA for emergent large vessel occlusion, with abrupt occlusion of the proximal cervical left ICA just distal to the bifurcation. Distal reconstitution at the carotid siphon with probable small amount of thrombus/clot protruding into the lumen at the supraclinoid left ICA. No other visible downstream embolic left MCA occlusion. 2. 4 cc acute core infarct at the anterior left frontal lobe, with large surrounding ischemic penumbra, mismatch volume measures 236 mL. 3. Atheromatous change about the right carotid bulb/proximal right ICA with associated stenosis of up to 55% by NASCET criteria. 4. Extensive atheromatous change throughout the intracranial circulation, with associated moderate to severe multifocal stenoses involving the right carotid siphon, V4 segments, and basilar artery as above. Critical Value/emergent results were called by telephone at the time of interpretation on 05/10/2022 at 8:26 pm to provider ERIC [facility] , who verbally acknowledged these results. Electronically Signed   By: Jeannine Boga M.D.   On: 05/10/2022 20:44   CT HEAD CODE STROKE WO CONTRAST  Result Date: 05/10/2022 CLINICAL DATA:  Code stroke. Initial evaluation for neuro deficit, stroke suspected. EXAM: CT HEAD WITHOUT CONTRAST TECHNIQUE: Contiguous axial images were  obtained from the base of the skull through the vertex without intravenous contrast. RADIATION DOSE REDUCTION: This exam was performed according to the departmental dose-optimization program which includes automated exposure control, adjustment of the mA and/or kV according to patient size and/or use of iterative reconstruction technique. COMPARISON:  Previous MRI from 03/18/2022. FINDINGS: Brain: Age-related cerebral atrophy. Small remote cortical infarct at the high posterior left parietal lobe. Additional remote left cerebellar infarct. Small remote lacunar infarct at the left basal ganglia.  No acute intracranial hemorrhage. No visible acute large vessel territory infarct. No mass lesion or midline shift. No hydrocephalus or extra-axial fluid collection. Vascular: No hyperdense vessel. Calcified atherosclerosis present at skull base. Skull: Scalp soft tissues within normal limits.  Calvarium intact. Sinuses/Orbits: Left gaze noted. Globes orbital soft tissues demonstrate no other acute finding. Remote posttraumatic defect at the left lamina papyracea noted. Chronic mucoperiosteal thickening noted about the ethmoidal air cells and visualized right maxillary sinus. Mastoid air cells are clear. Other: None. ASPECTS [facility] Stroke Program Early CT Score) - Ganglionic level infarction (caudate, lentiform nuclei, internal capsule, insula, M1-M3 cortex): 7 - Supraganglionic infarction (M4-M6 cortex): 3 Total score (0-10 with 10 being normal): 10 IMPRESSION: 1. No acute intracranial abnormality 2. Ten.  ASPECTS is 3. 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 These results were communicated to Dr. Cheral Marker at 8:13 pm on 05/10/2022 by text page via the [facility] messaging system. Electronically Signed   By: Jeannine Boga M.D.   On: 05/10/2022 20:20   US Pelvis Limited  Result Date: 05/01/2022 CLINICAL DATA:  Pain, abscess, 2 lumps on RIGHT buttock and 1  lump on LEFT buttock for 1 month, started draining pus and blood today EXAM: LIMITED ULTRASOUND OF PELVIS TECHNIQUE: Limited transabdominal ultrasound examination of the pelvis was performed. COMPARISON:  None FINDINGS: Sonography performed at the sites of clinical concern. At the LEFT buttock, a complex subcutaneous fluid collection is identified measuring 11.3 x 5.4 x 1.1 cm in size, containing complex heterogeneous internal material and thick wall. This site was actively draining pus and blood. In the RIGHT buttock, a complex subcutaneous collection is seen 7.2 x 1.4 x 3.5 cm, also containing complex internal fluid and a thick irregular wall with mild surrounding hypervascularity. A second collection with similar appearance is identified measuring 3.5 x 1.2 x 2.4 cm, also demonstrating mild surrounding hypervascularity. IMPRESSION: Three complex subcutaneous fluid collections are seen within the buttocks, larger 1 on LEFT, 2 smaller ones on RIGHT. The presence of surrounding hypervascularity is suggestive of an inflammatory process. Favor these representing subcutaneous abscesses, hematomas considered less likely. Clinical follow-up until resolution recommended. Electronically Signed   By: Lavonia Dana M.D.   On: 05/01/2022 15:03       HISTORY OF PRESENT ILLNESS Patient with a history of asthma, COPD, HS and HTN presented with confusion and aphasia   HOSPITAL COURSE Patient was seen in the ED, and CTA head revealed left ICA occlusion.  TNK was given, and thrombectomy was performed to the left ICA with TICI3 revascularization achieved.  Patient also c/o pain and purulent drainage to the bilateral inner thighs and groin area.  He has had HS flares to this area before.  Hospitalist was consulted, and patient was placed on IV antibiotics.  He has been working with PT and OT and is now ready to discharge to CIR.  May consider loop recorder before discharge from CIR as patient has had embolic appearing strokes in  the past.  Stroke:  Scattered small acute infarcts in the left cerebral hemisphere and right posterior lentiform nucleus secondary to left ICA occlusion s/p IV TNK and successful thrombectomy with TICI3 revascularization Code Stroke CT head No acute abnormality.  CTA head & neck Abrupt occlusion of the proximal cervical left ICA just distal to the bifurcation CT perfusion 4 cc acute core infarct at the anterior left frontal lobe, with large surrounding ischemic penumbra, mismatch volume measures 236 mL Post IR  CT No evidence of thromboembolic or hemorrhagic complication. MRI scattered small acute infarcts in the left cerebral hemisphere, with an additional punctate acute infarct in the right posterior lentiform nucleus.  2D Echo EF 60-65%, no LVH LDL 91 HgbA1c 5.3 VTE prophylaxis - SCDs       Diet    Diet Heart Room service appropriate? Yes with Assist; Fluid consistency: Thin        aspirin 81 mg daily prior to admission, now on aspirin 81 mg and Plavix 75 mg daily.  For 3 weeks followed by Plavix alone Therapy recommendations:  CIR Disposition:  CIR   Mechanical thrombectomy of Left ICA with TICI3 revascularization MRI shows scattered infarcts in left hemisphere BP goal 120-140 for 24 hours post procedure   Hypertension Home meds:  none Stable- off cleviprex BP goal 120-140 Long-term BP goal normotensive Start norvasc $RemoveBefor'10mg'[id number]$    Hyperlipidemia Home meds:  atorvastatin $RemoveBefor'80mg'ZRxnsIZjUXly$ , resumed in hospital LDL 91, goal < 70 Continue statin at discharge     Other Stroke Risk Factors Cigarette smoker, advised to stop smoking ETOH use, alcohol level <10, advised to drink no more than 2 drink(s) a day On CIWA protocol with ativan Thiamine, folic acid, multi vit ordered Substance abuse - UDS:  THC POSITIVE, Cocaine NONE DETECTED. Patient advised to stop using due to stroke risk. Obesity, There is no height or weight on file to calculate BMI., BMI >/= 30 associated with increased stroke  risk, recommend weight loss, diet and exercise as appropriate  Hx stroke/TIA 03/07/2022- full stroke work up done at [facility]- had numbness on the right side  MRI showed multifocal ischemic areas in an embolic distribution  DISCHARGE EXAM Blood pressure 133/87, pulse 70, temperature 98.4 F (36.9 C), temperature source Oral, resp. rate 17, SpO2 100 %. Physical Exam  Constitutional: Appears well-developed and well-nourished middle-aged African-American male.  Cardiovascular: Normal rate and regular rhythm.  Respiratory: Effort normal, non-labored breathing Integumentary:  Firm, warm and tender areas with purulent drainage noted on bilateral inner thighs/groin area   Neuro: Mental Status: Patient is awake, alert, oriented to person, place, month, year, and situation. Patient is able to give a clear and coherent history.  No signs of aphasia or neglect.  Speech is fluent with only slight word hesitancy but no paraphasic errors.  Able to name repeat and comprehend well. Cranial Nerves: II: Visual Fields are full. Pupils are equal, round, and reactive to light.   III,IV, VI: EOMI without ptosis or diploplia.  VII: Facial movement is symmetric resting and smiling VIII: Hearing is intact to voice X: Phonation is normal  Motor: Tone is normal. Bulk is normal. 5/5 strength was present in LUE and LLE RUE 4+/5 mild right grip weakness.  Diminished fine finger movements on the right.                     LUE 5/5 RLE 4+/5        LLE 5/5 Hand weakness on the right with decreased fine motor movements Sensory: Sensation is symmetric to light touch in the arms and legs.  Gait deferred  Discharge Diet      Diet   Diet Heart Room service appropriate? Yes with Assist; Fluid consistency: Thin   liquids  DISCHARGE PLAN Disposition:  Transfer to [place] for ongoing PT, OT and ST aspirin 81 mg daily and clopidogrel 75 mg daily for secondary stroke prevention for 3 weeks then Plavix  alone. Recommend ongoing stroke risk factor  control by Primary Care Physician at time of discharge from inpatient rehabilitation. Follow-up PCP Pcp, No in 2 weeks following discharge from rehab. Follow-up in [facility] in 8 weeks following discharge from rehab, office to schedule an appointment.   32 minutes were spent preparing discharge.  [place] , MSN, AGACNP-BC [facility] See Amion for schedule and pager information 05/16/2022 1:41 PM   I have personally obtained history,examined this patient, reviewed notes, independently viewed imaging studies, participated in medical decision making and plan of care.ROS completed by me personally and pertinent positives fully documented  I have made any additions or clarifications directly to the above note. Agree with note above.    Antony Contras, MD Medical Director [facility] Pager: [phone] 05/16/2022 2:47 PM

## 2022-05-16 NOTE — TOC Transition Note (Signed)
Transition of Care [facility]: [facility]) - CM/SW Discharge Note   Patient Details  Name: ZAYVIER CARAVELLO MRN: [id number] Date of Birth: [birth year]  Transition of Care [facility]) CM/SW Contact:  Kermit Balo, RN Phone Number: 05/16/2022, 11:51 AM   Clinical Narrative:    Patient is discharging to CIR today. CM is signing off.   Final next level of care: IP Rehab Facility Barriers to Discharge: Barriers Unresolved (comment), Inadequate or no insurance   Patient Goals and CMS Choice     Choice offered to / list presented to : Patient  Discharge Placement                       Discharge Plan and Services                                     Social Determinants of Health (SDOH) Interventions     Readmission Risk Interventions     No data to display

## 2022-05-16 NOTE — Progress Notes (Signed)
Pharmacy Antibiotic Note  NTHONY LEFFERTS is a 56 y.o. male admitted on 05/10/2022 with  wound infection .  Pharmacy has been consulted for vancomycin and zosyn dosing.  Plan: Vancomycin 2000mg  IV x1, followed by 1000mg  IV Q12h (eAUC 452, goal 400-550) Zosyn 3.375g Q8h  Trend WBC, Fever, Renal function F/u cultures, clinical course  De-escalate when able   Temp (24hrs), Avg:98 F (36.7 C), Min:97.7 F (36.5 C), Max:98.4 F (36.9 C)  Recent Labs  Lab 05/10/22 2006 05/11/22 0249  WBC 7.1 9.7  CREATININE 0.98 1.04    Estimated Creatinine Clearance: 105.8 mL/min (by C-G formula based on SCr of 1.04 mg/dL).    No Known Allergies   Thank you for allowing pharmacy to be a part of this patient's care.  2007, PharmD Clinical Pharmacist

## 2022-05-16 NOTE — Progress Notes (Signed)
Inpatient Rehab Admissions Coordinator:  ° °I have a CIR bed for this Pt. Today. RN may call report to [phone]. ° °Mesiah Manzo, MS, CCC-SLP °Rehab Admissions Coordinator  °[phone] (celll) °[phone] (office) ° °

## 2022-05-16 NOTE — Plan of Care (Signed)
  Problem: Education: Goal: Understanding of CV disease, CV risk reduction, and recovery process will improve Outcome: Adequate for Discharge Goal: Individualized Educational Video(s) Outcome: Adequate for Discharge   Problem: Activity: Goal: Ability to return to baseline activity level will improve Outcome: Adequate for Discharge   Problem: Cardiovascular: Goal: Ability to achieve and maintain adequate cardiovascular perfusion will improve Outcome: Adequate for Discharge Goal: Vascular access site(s) Level 0-1 will be maintained Outcome: Adequate for Discharge   Problem: Health Behavior/Discharge Planning: Goal: Ability to safely manage health-related needs after discharge will improve Outcome: Adequate for Discharge   Problem: Safety: Goal: Non-violent Restraint(s) Outcome: Adequate for Discharge   Problem: Education: Goal: Knowledge of disease or condition will improve Outcome: Adequate for Discharge Goal: Knowledge of secondary prevention will improve (SELECT ALL) Outcome: Adequate for Discharge Goal: Knowledge of patient specific risk factors will improve (INDIVIDUALIZE FOR PATIENT) Outcome: Adequate for Discharge Goal: Individualized Educational Video(s) Outcome: Adequate for Discharge   Problem: Coping: Goal: Will verbalize positive feelings about self Outcome: Adequate for Discharge Goal: Will identify appropriate support needs Outcome: Adequate for Discharge   Problem: Health Behavior/Discharge Planning: Goal: Ability to manage health-related needs will improve Outcome: Adequate for Discharge   Problem: Self-Care: Goal: Ability to participate in self-care as condition permits will improve Outcome: Adequate for Discharge Goal: Ability to communicate needs accurately will improve Outcome: Adequate for Discharge   Problem: Nutrition: Goal: Risk of aspiration will decrease Outcome: Adequate for Discharge Goal: Dietary intake will improve Outcome: Adequate  for Discharge   Problem: Ischemic Stroke/TIA Tissue Perfusion: Goal: Complications of ischemic stroke/TIA will be minimized Outcome: Adequate for Discharge   Problem: Education: Goal: Knowledge of General Education information will improve Description: Including pain rating scale, medication(s)/side effects and non-pharmacologic comfort measures Outcome: Adequate for Discharge   Problem: Health Behavior/Discharge Planning: Goal: Ability to manage health-related needs will improve Outcome: Adequate for Discharge   Problem: Clinical Measurements: Goal: Ability to maintain clinical measurements within normal limits will improve Outcome: Adequate for Discharge Goal: Will remain free from infection Outcome: Adequate for Discharge Goal: Diagnostic test results will improve Outcome: Adequate for Discharge Goal: Respiratory complications will improve Outcome: Adequate for Discharge Goal: Cardiovascular complication will be avoided Outcome: Adequate for Discharge   Problem: Activity: Goal: Risk for activity intolerance will decrease Outcome: Adequate for Discharge   Problem: Nutrition: Goal: Adequate nutrition will be maintained Outcome: Adequate for Discharge   Problem: Coping: Goal: Level of anxiety will decrease Outcome: Adequate for Discharge   Problem: Elimination: Goal: Will not experience complications related to bowel motility Outcome: Adequate for Discharge Goal: Will not experience complications related to urinary retention Outcome: Adequate for Discharge   Problem: Pain Managment: Goal: General experience of comfort will improve Outcome: Adequate for Discharge   Problem: Safety: Goal: Ability to remain free from injury will improve Outcome: Adequate for Discharge   Problem: Skin Integrity: Goal: Risk for impaired skin integrity will decrease Outcome: Adequate for Discharge

## 2022-05-16 NOTE — Plan of Care (Signed)
  Problem: Health Behavior/Discharge Planning: Goal: Ability to safely manage health-related needs after discharge will improve Outcome: Progressing   Problem: Education: Goal: Knowledge of disease or condition will improve Outcome: Progressing Goal: Knowledge of secondary prevention will improve (SELECT ALL) Outcome: Progressing   Problem: Coping: Goal: Will verbalize positive feelings about self Outcome: Progressing   Problem: Health Behavior/Discharge Planning: Goal: Ability to manage health-related needs will improve Outcome: Progressing   Problem: Self-Care: Goal: Ability to participate in self-care as condition permits will improve Outcome: Progressing Goal: Ability to communicate needs accurately will improve Outcome: Progressing

## 2022-05-16 NOTE — Progress Notes (Signed)
Occupational Therapy Treatment Patient Details Name: James Lambert MRN: [id number] DOB: 30-Aug-1966 Today's Date: 05/16/2022   History of present illness 56 y.o. male presents to [facility] hospital 05/10/2022 with AMS and aphasia. Of note pt with 2 admissions in April 2023 with CVAs. CTA demonstrates L ICA occlusion. Pt received TNK and underwent thrombectomy on 6/3. PMH includes asthma, COPD, HTN, CVA.   OT comments  Pt. Seen for skilled OT treatment session.  Completed bed mobility, toileting task, LB bathing, and standing grooming task with min guard a.  Cues for rw management but overall safe during mobility.  Agree with current d/c recommendations.     Recommendations for follow up therapy are one component of a multi-disciplinary discharge planning process, led by the attending physician.  Recommendations may be updated based on patient status, additional functional criteria and insurance authorization.    Follow Up Recommendations  Acute inpatient rehab (3hours/day)    Assistance Recommended at Discharge Frequent or constant Supervision/Assistance  Patient can return home with the following  A lot of help with walking and/or transfers;A lot of help with bathing/dressing/bathroom;Assistance with cooking/housework;Assistance with feeding;Assist for transportation;Direct supervision/assist for financial management;Direct supervision/assist for medications management   Equipment Recommendations  None recommended by OT;Other (comment)    Recommendations for Other Services Rehab consult    Precautions / Restrictions Precautions Precautions: Fall       Mobility Bed Mobility Overal bed mobility: Needs Assistance Bed Mobility: Supine to Sit     Supine to sit: Supervision, HOB elevated          Transfers Overall transfer level: Needs assistance Equipment used: Rolling walker (2 wheels) Transfers: Sit to/from Stand, Bed to chair/wheelchair/BSC Sit to Stand: Min guard     Step pivot  transfers: Min guard     General transfer comment: cues for posture correction during ambulation tends to stay flexed and head down     Balance                                           ADL either performed or assessed with clinical judgement   ADL Overall ADL's : Needs assistance/impaired     Grooming: Wash/dry hands;Wash/dry face;Oral care;Standing;Supervision/safety       Lower Body Bathing: Min guard;Sit to/from stand Lower Body Bathing Details (indicate cue type and reason): pt. stood to wash peri areas/buttocks         Toilet Transfer: Min guard;Rolling walker (2 wheels);Ambulation;Regular Teacher, adult education Details (indicate cue type and reason): stood to urinate         Functional mobility during ADLs: Min guard;Rolling walker (2 wheels) General ADL Comments: moving well no lob noted.    Extremity/Trunk Assessment              Vision       Perception     Praxis      Cognition Arousal/Alertness: Awake/alert Behavior During Therapy: WFL for tasks assessed/performed                                            Exercises      Shoulder Instructions       General Comments  Pt. Reports he cares for a family member at home    Pertinent Vitals/ Pain  Pain Assessment Pain Assessment: 0-10 Pain Score: 5  Pain Location: R knee Pain Descriptors / Indicators: Aching Pain Intervention(s): Limited activity within patient's tolerance, Monitored during session, Repositioned  Home Living                                          Prior Functioning/Environment              Frequency  Min 2X/week        Progress Toward Goals  OT Goals(current goals can now be found in the care plan section)  Progress towards OT goals: Progressing toward goals     Plan Discharge plan remains appropriate;Frequency remains appropriate    Co-evaluation                 AM-PAC OT "6 Clicks"  Daily Activity     Outcome Measure   Help from another person eating meals?: None Help from another person taking care of personal grooming?: A Little Help from another person toileting, which includes using toliet, bedpan, or urinal?: A Little Help from another person bathing (including washing, rinsing, drying)?: A Lot Help from another person to put on and taking off regular upper body clothing?: A Lot Help from another person to put on and taking off regular lower body clothing?: A Lot 6 Click Score: 16    End of Session Equipment Utilized During Treatment: Rolling walker (2 wheels)  OT Visit Diagnosis: Unsteadiness on feet (R26.81);Other abnormalities of gait and mobility (R26.89);Muscle weakness (generalized) (M62.81);Other symptoms and signs involving cognitive function;Hemiplegia and hemiparesis;Low vision, both eyes (H54.2) Hemiplegia - Right/Left: Right Hemiplegia - dominant/non-dominant: Dominant Hemiplegia - caused by: Cerebral infarction   Activity Tolerance Patient tolerated treatment well   Patient Left in chair;with call bell/phone within reach;with chair alarm set   Nurse Communication          Time: 6629-4765 OT Time Calculation (min): 20 min  Charges: OT General Charges $OT Visit: 1 Visit OT Treatments $Self Care/Home Management : 8-22 mins  James Lambert, COTA/L Acute Rehabilitation [phone]   James Lambert 05/16/2022, 10:53 AM

## 2022-05-16 NOTE — Progress Notes (Addendum)
STROKE TEAM PROGRESS NOTE   INTERVAL HISTORY Patient is seen in his room with no family at the bedside.  Vital signs are stable and his neurological exam is stable.  Today, he complains of pain and purulent drainage to bilateral inner thighs/groin area.  Firm, warm and tender areas noted on bilateral inner thighs with some purulent drainage, but not fluctuant.  Patient afebrile, WBC not elevated, doesn't appear toxic.  Will obtain hospitalist consult for appropriate antibiotics and treatment.  Still awaiting CIR bed.  Vitals:   05/15/22 1959 05/15/22 2331 05/16/22 0326 05/16/22 0923  BP: (!) 161/97 (!) 150/99 (!) 155/102 (!) 156/96  Pulse: 63 62 63 61  Resp: 17 16 17 17   Temp: 97.8 F (36.6 C) 97.7 F (36.5 C) 98.4 F (36.9 C) 98 F (36.7 C)  TempSrc: Oral Oral Oral Oral  SpO2: 99% 100% 100% 100%   CBC:  Recent Labs  Lab 05/10/22 2006 05/10/22 2328 05/11/22 0249  WBC 7.1  --  9.7  NEUTROABS 3.6  --   --   HGB 11.9* 12.6* 12.8*  HCT 36.3* 37.0* 37.6*  MCV 93.1  --  91.0  PLT 385  --  394    Basic Metabolic Panel:  Recent Labs  Lab 05/10/22 2006 05/10/22 2328 05/11/22 0249  NA 134* 135 131*  K 4.3 3.8 3.8  CL 104  --  101  CO2 20*  --  20*  GLUCOSE 96  --  171*  BUN 10  --  7  CREATININE 0.98  --  1.04  CALCIUM 8.5*  --  8.8*  MG  --   --  1.7  PHOS  --   --  2.5    Lipid Panel:  Recent Labs  Lab 05/11/22 0249  CHOL 140  TRIG 87  HDL 32*  CHOLHDL 4.4  VLDL 17  LDLCALC 91    HgbA1c: No results for input(s): "HGBA1C" in the last 168 hours. Urine Drug Screen:  Recent Labs  Lab 05/11/22 0405  LABOPIA NONE DETECTED  COCAINSCRNUR NONE DETECTED  LABBENZ NONE DETECTED  AMPHETMU NONE DETECTED  THCU POSITIVE*  LABBARB NONE DETECTED     Alcohol Level  Recent Labs  Lab 05/11/22 0249  ETH <10     IMAGING past 24 hours No results found.  PHYSICAL EXAM  Physical Exam  Constitutional: Appears well-developed and well-nourished middle-aged  African-American male.  Cardiovascular: Normal rate and regular rhythm.  Respiratory: Effort normal, non-labored breathing Integumentary:  Firm, warm and tender areas with purulent drainage noted on bilateral inner thighs/groin area  Neuro: Mental Status: Patient is awake, alert, oriented to person, place, month, year, and situation. Patient is able to give a clear and coherent history.  No signs of aphasia or neglect.  Speech is fluent with only slight word hesitancy but no paraphasic errors.  Able to name repeat and comprehend well. Cranial Nerves: II: Visual Fields are full. Pupils are equal, round, and reactive to light.   III,IV, VI: EOMI without ptosis or diploplia.  VII: Facial movement is symmetric resting and smiling VIII: Hearing is intact to voice X: Phonation is normal  Motor: Tone is normal. Bulk is normal. 5/5 strength was present in LUE and LLE RUE 4+/5 mild right grip weakness.  Diminished fine finger movements on the right.  LUE 5/5 RLE 4+/5 LLE 5/5 Hand weakness on the right with decreased fine motor movements Sensory: Sensation is symmetric to light touch in the arms and legs.  Gait deferred  ASSESSMENT/PLAN Mr. MACARTHUR LORUSSO is a 56 y.o. male with history of asthma, copd, hidradenitis suppurativa, and HTN presenting with confusion and aphasia.  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 Taken for thrombectomy achieving TICI3 revasc. He does have some residual right side weakness. Consider loop recorder prior to discharge if he does not show afib inpatient. Recently had a stroke work up done for embolic appearing strokes at [facility], he had not yet followed up with cardiology or neurology outpatient. Candidate for sleep smart study but screen failure due to too many central sleep apneas.   Stroke:  Scattered small acute infarcts in the left cerebral hemisphere and right posterior lentiform nucleus secondary to left ICA occlusion s/p IV TNK and  successful thrombectomy with TICI3 revascularization Code Stroke CT head No acute abnormality.  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 MRI scattered small acute infarcts in the left cerebral hemisphere, with an additional punctate acute infarct in the right posterior lentiform nucleus.  2D Echo EF 60-65%, no LVH LDL 91 HgbA1c 5.3 VTE prophylaxis - SCDs    Diet   Diet Heart Room service appropriate? Yes with Assist; Fluid consistency: Thin   aspirin 81 mg daily prior to admission, now on aspirin 81 mg and Plavix 75 mg daily.  For 3 weeks followed by Plavix alone Therapy recommendations:  CIR Disposition:  CIR  Mechanical thrombectomy of Left ICA with TICI3 revascularization MRI shows scattered infarcts in left hemisphere BP goal 120-140 for 24 hours post procedure  Hypertension Home meds:  none Stable- off cleviprex BP goal 120-140 Long-term BP goal normotensive Start norvasc 10mg   Hyperlipidemia Home meds:  atorvastatin 80mg , resumed in hospital LDL 91, goal < 70 Continue statin at discharge   Other Stroke Risk Factors Cigarette smoker, advised to stop smoking ETOH use, alcohol level <10, advised to drink no more than 2 drink(s) a day On CIWA protocol with ativan Thiamine, folic acid, multi vit ordered Substance abuse - UDS:  THC POSITIVE, Cocaine NONE DETECTED. Patient advised to stop using due to stroke risk. Obesity, There is no height or weight on file to calculate BMI., BMI >/= 30 associated with increased stroke risk, recommend weight loss, diet and exercise as appropriate  Hx stroke/TIA 03/07/2022- full stroke work up done at [facility]- had numbness on the right side  MRI showed multifocal ischemic  areas in an embolic distribution  Other Active Problems   Hospital day # 6  Patient seen and examined by NP/APP with MD. MD to update note as needed.   Makiah Foye E 03/09/2022 , MSN, AGACNP-BC [facility] See Amion for schedule and pager information 05/16/2022 10:18 AM     To contact Stroke Continuity provider, please refer to Ernestina [place]. After hours, contact General Neurology

## 2022-05-16 NOTE — Consult Note (Addendum)
Hospitalist Consult Note   James Lambert  [id number]  DOB: October 01, 1966  DOA: 05/10/2022  PCP: Pcp, No  Requesting provider: Cinda Quest, NP  Reason for consultation: Hidradenitis suppurativa management   History of Present Illness: James Lambert is a 56 year old male with PMH recurrent CVAs with residual right-sided weakness, HTN, COPD, hidradenitis suppurativa.  He had been last hospitalized in April 2023 after developing right arm weakness and found to have acute CVA showing patchy multifocal acute ischemic infarcts bilaterally with concern for embolic phenomenon. He presented again later in April 2023 with further arm weakness/paresthesias on the right and was found to have new small acute infarct involving left subinsular white matter. This hospitalization he presented with acute onset confusion and aphasia. MRI brain showed small acute infarcts in the left cerebral hemisphere and acute infarct in the right posterior lentiform nucleus.  CT angio head/neck showed large vessel occlusion involving proximal cervical left ICA just distal to the bifurcation. He underwent thrombectomy on 05/12/2022 with recannulization of the left ICA achieved.  He required IV antibiotics during previous hospitalizations for his hidradenitis suppurativa.  He responds well to broad treatment with de-escalation last hospitalization down to Augmentin to complete course. He began developing pain in his left groin over an area of prior hidradenitis flaring; this had some induration and tenderness with possible purulent drainage.  Hospitalist was consulted for treatment and management.  Assessment and Plan: Hidradenitis suppurativa - Last hospitalization in April he had worsening of his sacrum/perineum requiring IV antibiotics which were transitioned to Augmentin at discharge.  At that time he was evaluated by general surgery and was not considered a surgical candidate due to his acute CVA at that  time.  He would again not be a surgical candidate due to recurrent acute CVA this hospitalization as well - He is afebrile and has no leukocytosis, although he did not develop leukocytosis in April either. -Site this time is his right inner groin noted to have induration, mild tenderness, foul odor, questionable drainage but no fluctuance appreciated.  Do not think he warrants ultrasound or further imaging at this time. -will treat with vancomycin and Zosyn for approximately 48 hours then pending clinical response, de-escalate back down to Augmentin to complete course    Review of Systems: Review of Systems  Review of Systems  Constitutional:  Negative for chills, diaphoresis and fever.  HENT: Negative.    Eyes: Negative.   Respiratory: Negative.    Cardiovascular: Negative.   Gastrointestinal: Negative.   Genitourinary: Negative.   Musculoskeletal:        Left groin pain  Skin: Negative.   Neurological:  Positive for focal weakness.  Endo/Heme/Allergies: Negative.   Psychiatric/Behavioral: Negative.       Past Medical History: Past Medical History:  Diagnosis Date   Asthma    COPD (chronic obstructive pulmonary disease) (HCC)    Hidradenitis suppurativa    diagnosed in [facility] ED based on history and physical exam   Hypertension     Past Surgical History: Past Surgical History:  Procedure Laterality Date   IR CT HEAD LTD  05/10/2022   IR FLUORO GUIDED NEEDLE PLC ASPIRATION/INJECTION LOC  03/20/2022   IR PERCUTANEOUS ART THROMBECTOMY/INFUSION INTRACRANIAL INC DIAG ANGIO  05/10/2022   IR US GUIDE VASC ACCESS RIGHT  05/10/2022   RADIOLOGY WITH ANESTHESIA N/A 05/10/2022   Procedure: IR WITH ANESTHESIA;  Surgeon: Luanne Bras, MD;  Location: [facility]  OR;  Service: Radiology;  Laterality: N/A;   TEE WITHOUT CARDIOVERSION N/A 03/11/2022   Procedure: TRANSESOPHAGEAL ECHOCARDIOGRAM (TEE);  Surgeon: Corey Skains, MD;  Location: [facility] ORS;  Service: Cardiovascular;  Laterality: N/A;     Allergies:  No Known Allergies  Social History:  reports that he has been smoking cigarettes. He has been smoking an average of .5 packs per day. He has never used smokeless tobacco. He reports current alcohol use. He reports current drug use. Drug: Marijuana.  Family History: No family history on file.  Objective: Physical Exam: Vitals:   05/15/22 1959 05/15/22 2331 05/16/22 0326 05/16/22 0923  BP: (!) 161/97 (!) 150/99 (!) 155/102 (!) 156/96  Pulse: 63 62 63 61  Resp: 17 16 17 17   Temp: 97.8 F (36.6 C) 97.7 F (36.5 C) 98.4 F (36.9 C) 98 F (36.7 C)  TempSrc: Oral Oral Oral Oral  SpO2: 99% 100% 100% 100%  Physical Exam Constitutional:      General: He is not in acute distress.    Appearance: Normal appearance.  HENT:     Head: Normocephalic and atraumatic.     Mouth/Throat:     Mouth: Mucous membranes are moist.  Eyes:     Extraocular Movements: Extraocular movements intact.  Cardiovascular:     Rate and Rhythm: Normal rate and regular rhythm.  Pulmonary:     Effort: Pulmonary effort is normal.     Breath sounds: Normal breath sounds.  Abdominal:     General: Bowel sounds are normal.     Palpations: Abdomen is soft.  Genitourinary:    Comments: Left inner groin/thigh noted with indurated area approx 4-5 cm in length with mild TTP, foul smell, moist possibly from unseen purulent drainage; no appreciable fluctuance and does not extend to posterior perineum  Musculoskeletal:        General: No swelling.     Cervical back: Normal range of motion and neck supple.  Skin:    General: Skin is warm and dry.  Neurological:     Mental Status: He is alert and oriented to person, place, and time.  Psychiatric:        Mood and Affect: Mood normal.     Data reviewed:  I have personally reviewed following labs and imaging studies No results found for this or any previous visit (from the past 28 hour(s)).  Labs:  CBC: Recent Labs  Lab 05/10/22 2006 05/10/22 2328  05/11/22 0249  WBC 7.1  --  9.7  NEUTROABS 3.6  --   --   HGB 11.9* 12.6* 12.8*  HCT 36.3* 37.0* 37.6*  MCV 93.1  --  91.0  PLT 385  --  [id number]    Basic Metabolic Panel: Recent Labs  Lab 05/10/22 2006 05/10/22 2328 05/11/22 0249  NA 134* 135 131*  K 4.3 3.8 3.8  CL 104  --  101  CO2 20*  --  20*  GLUCOSE 96  --  171*  BUN 10  --  7  CREATININE 0.98  --  1.04  CALCIUM 8.5*  --  8.8*  MG  --   --  1.7  PHOS  --   --  2.5   GFR Estimated Creatinine Clearance: 105.8 mL/min (by C-G formula based on SCr of 1.04 mg/dL). Liver Function Tests: Recent Labs  Lab 05/10/22 2006 05/11/22 0249  AST 14* 12*  ALT 7 10  ALKPHOS 64 74  BILITOT 0.6 0.2*  PROT 8.5* 8.1  ALBUMIN 3.2* 3.3*  No results for input(s): "LIPASE", "AMYLASE" in the last 168 hours. No results for input(s): "AMMONIA" in the last 168 hours. Coagulation profile Recent Labs  Lab 05/10/22 2006  INR 1.1    Cardiac Enzymes: No results for input(s): "CKTOTAL", "CKMB", "CKMBINDEX", "TROPONINI" in the last 168 hours. BNP: Invalid input(s): "POCBNP" CBG: Recent Labs  Lab 05/10/22 1950 05/10/22 2326  GLUCAP 98 156*   D-Dimer No results for input(s): "DDIMER" in the last 72 hours. Hgb A1c No results for input(s): "HGBA1C" in the last 72 hours. Lipid Profile No results for input(s): "CHOL", "HDL", "LDLCALC", "TRIG", "CHOLHDL", "LDLDIRECT" in the last 72 hours. Thyroid function studies No results for input(s): "TSH", "T4TOTAL", "T3FREE", "THYROIDAB" in the last 72 hours.  Invalid input(s): "FREET3" Anemia work up No results for input(s): "VITAMINB12", "FOLATE", "FERRITIN", "TIBC", "IRON", "RETICCTPCT" in the last 72 hours. Urinalysis    Component Value Date/Time   COLORURINE YELLOW (A) 03/18/2022 1501   APPEARANCEUR HAZY (A) 03/18/2022 1501   LABSPEC 1.021 03/18/2022 1501   PHURINE 5.0 03/18/2022 1501   GLUCOSEU NEGATIVE 03/18/2022 1501   HGBUR MODERATE (A) 03/18/2022 1501   BILIRUBINUR NEGATIVE  03/18/2022 1501   KETONESUR 20 (A) 03/18/2022 1501   PROTEINUR NEGATIVE 03/18/2022 1501   NITRITE NEGATIVE 03/18/2022 1501   LEUKOCYTESUR NEGATIVE 03/18/2022 1501    Microbiology No results found for this or any previous visit (from the past 240 hour(s)).  Inpatient Medications:   Scheduled Meds:  amLODipine  10 mg Oral Daily   aspirin EC  81 mg Oral Daily   atorvastatin  80 mg Oral Daily   baclofen  5 mg Oral TID   Chlorhexidine Gluconate Cloth  6 each Topical Daily   clopidogrel  75 mg Oral Daily   folic acid  1 mg Oral Daily   metoprolol succinate  25 mg Oral Daily   multivitamin with minerals  1 tablet Oral Daily   pantoprazole  40 mg Oral QHS   senna-docusate  1 tablet Oral BID   thiamine  100 mg Oral Daily   traMADol  50 mg Oral Q6H   PRN Meds: acetaminophen **OR** acetaminophen (TYLENOL) oral liquid 160 mg/5 mL **OR** acetaminophen, labetalol, ondansetron (ZOFRAN) IV Continuous Infusions:  Radiological Exams on Admission: No results found.   Thank you for this consultation.  Our [facility] hospitalist team will follow the patient with you.   Time spent: 65 minutes  Dwyane Dee M.D. [facility] 05/16/2022, 12:42 PM Pager: Secure chat

## 2022-05-17 LAB — CBC WITH DIFFERENTIAL/PLATELET
Abs Immature Granulocytes: 0.1 10*3/uL — ABNORMAL HIGH (ref 0.00–0.07)
Basophils Absolute: 0 10*3/uL (ref 0.0–0.1)
Basophils Relative: 1 %
Eosinophils Absolute: 0.4 10*3/uL (ref 0.0–0.5)
Eosinophils Relative: 7 %
HCT: 34.6 % — ABNORMAL LOW (ref 39.0–52.0)
Hemoglobin: 11.3 g/dL — ABNORMAL LOW (ref 13.0–17.0)
Immature Granulocytes: 2 %
Lymphocytes Relative: 33 %
Lymphs Abs: 1.9 10*3/uL (ref 0.7–4.0)
MCH: 30.4 pg (ref 26.0–34.0)
MCHC: 32.7 g/dL (ref 30.0–36.0)
MCV: 93 fL (ref 80.0–100.0)
Monocytes Absolute: 0.7 10*3/uL (ref 0.1–1.0)
Monocytes Relative: 12 %
Neutro Abs: 2.7 10*3/uL (ref 1.7–7.7)
Neutrophils Relative %: 45 %
Platelets: 359 10*3/uL (ref 150–400)
RBC: 3.72 MIL/uL — ABNORMAL LOW (ref 4.22–5.81)
RDW: 14 % (ref 11.5–15.5)
WBC: 5.8 10*3/uL (ref 4.0–10.5)
nRBC: 0 % (ref 0.0–0.2)

## 2022-05-17 LAB — COMPREHENSIVE METABOLIC PANEL
ALT: 9 U/L (ref 0–44)
AST: 10 U/L — ABNORMAL LOW (ref 15–41)
Albumin: 2.8 g/dL — ABNORMAL LOW (ref 3.5–5.0)
Alkaline Phosphatase: 63 U/L (ref 38–126)
Anion gap: 8 (ref 5–15)
BUN: 16 mg/dL (ref 6–20)
CO2: 23 mmol/L (ref 22–32)
Calcium: 8.9 mg/dL (ref 8.9–10.3)
Chloride: 103 mmol/L (ref 98–111)
Creatinine, Ser: 1.17 mg/dL (ref 0.61–1.24)
GFR, Estimated: >60 mL/min (ref >60)
Glucose, Bld: 125 mg/dL — ABNORMAL HIGH (ref 70–99)
Potassium: 4.2 mmol/L (ref 3.5–5.1)
Sodium: 134 mmol/L — ABNORMAL LOW (ref 135–145)
Total Bilirubin: 0.5 mg/dL (ref 0.3–1.2)
Total Protein: 7.1 g/dL (ref 6.5–8.1)

## 2022-05-17 MED ORDER — VANCOMYCIN HCL 1250 MG/250ML IV SOLN
1250.0000 mg | Freq: Two times a day (BID) | INTRAVENOUS | Status: DC
Start: 2022-05-17 — End: 2022-05-19
  Administered 2022-05-17 – 2022-05-19 (×4): 1250 mg via INTRAVENOUS
  Filled 2022-05-17 (×5): qty 250

## 2022-05-17 NOTE — Evaluation (Signed)
Speech Language Pathology Assessment and Plan  Patient Details  Name: James Lambert MRN: [id number] Date of Birth: 1966/02/14  SLP Diagnosis: Speech and Language deficits;Cognitive Impairments  Rehab Potential: Excellent ELOS: 10-12 days    Today's Date: 05/17/2022 SLP Individual Time: 1006-1101 SLP Individual Time Calculation (min): 51 min   Hospital Problem: Principal Problem:   Ischemic stroke of frontal lobe ([place])  Past Medical History:  Past Medical History:  Diagnosis Date   Asthma    COPD (chronic obstructive pulmonary disease) ([place])    Hidradenitis suppurativa    diagnosed in [facility] ED based on history and physical exam   Hypertension    Past Surgical History:  Past Surgical History:  Procedure Laterality Date   IR CT HEAD LTD  05/10/2022   IR FLUORO GUIDED NEEDLE PLC ASPIRATION/INJECTION LOC  03/20/2022   IR PERCUTANEOUS ART THROMBECTOMY/INFUSION INTRACRANIAL INC DIAG ANGIO  05/10/2022   IR US GUIDE VASC ACCESS RIGHT  05/10/2022   RADIOLOGY WITH ANESTHESIA N/A 05/10/2022   Procedure: IR WITH ANESTHESIA;  Surgeon: Luanne Bras, MD;  Location: [facility];  Service: Radiology;  Laterality: N/A;   TEE WITHOUT CARDIOVERSION N/A 03/11/2022   Procedure: TRANSESOPHAGEAL ECHOCARDIOGRAM (TEE);  Surgeon: Corey Skains, MD;  Location: [facility];  Service: Cardiovascular;  Laterality: N/A;    Assessment / Plan / Recommendation Clinical Impression Patient is a 56 year old male with heavy daily alcohol intake who was at his group home drinking with his brother.  His brother noticed right facial droop and decreased level of consciousness.  EMS was called and he was taken to [facility] emergency department on 05/10/2022.    CT of head without acute intracranial abnormality.  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  CT perfusion with a 4 cc cute core infarct at the anterior left frontal lobe with a  large surrounding ischemic penumbra. Patient was transferred to [facility] and underwent diagnostic cerebral angiogram with mechanical thrombectomy later that evening.  Critical care medicine consulted on 6/4 due to onset of agitation suspected alcohol withdrawal.  CIWA protocol initiated.  Therapy evaluations completed with recommendations for CIR. Patient admitted 05/16/22.  Upon arrival, patient was awake while upright in the wheelchair. Shortly after arrival, the nurse practitioner arrived to assess patient's skin/groin wound. During transfer back to bed, patient had obvious motor planning deficits with difficulty following directions as to proper positioning. Patient was able to donn a new shirt and pants independently and was transferred back to the wheelchair for complete of the cognitive assessment. SLP administered the [facility] Mental Status Examination (SLUMS). Patient scored  21/30 points with a score of 27 or above considered normal. Patient demonstrated deficits in short-term recall and problem solving with motor planning deficits again noted during the clock drawing task. Patient's overall auditory comprehension and verbal expression appeared [facility] though patient did require extra time, again suspect due to possible motor planning deficits. Patient would benefit from skilled SLP intervention to maximize his cognitive-linguistic functioning prior to discharge.      Skilled Therapeutic Interventions          Administered a cognitive-linguistic evaluation, please see above for details.   SLP Assessment  Patient will need skilled [facility] during CIR admission    Recommendations  Patient destination: Home Follow up Recommendations: Outpatient SLP (intermittent supervision) Equipment Recommended: None recommended by SLP    SLP Frequency 3  to 5 out of 7 days   SLP Duration  SLP Intensity  SLP Treatment/Interventions 10-12 days  Minumum of 1-2  x/day, 30 to 90 minutes  Cognitive remediation/compensation;Internal/external aids;Speech/Language facilitation;Cueing hierarchy;Environmental controls;Therapeutic Activities;Functional tasks;Patient/family education    Pain No/Denies Pain   Prior Functioning Type of Home: Apartment  Lives With: Family (brother) Available Help at Discharge: Available 24 hours/day;Available PRN/intermittently;Family;Friend(s) Vocation: Unemployed  SLP Evaluation Cognition Overall Cognitive Status: Impaired/Different from baseline Arousal/Alertness: Awake/alert Orientation Level: Oriented X4 Attention: Sustained Sustained Attention: Appears intact Memory: Impaired Memory Impairment: Retrieval deficit Awareness: Impaired Awareness Impairment: Emergent impairment Problem Solving: Impaired Problem Solving Impairment: Functional basic Safety/Judgment: Impaired  Comprehension Auditory Comprehension Overall Auditory Comprehension: Appears within functional limits for tasks assessed Expression Expression Primary Mode of Expression: Verbal Verbal Expression Overall Verbal Expression: Appears within functional limits for tasks assessed Other Verbal Expression Comments: mild hesitiaton during verbal expression, appeared to be due to possible motor planning deficits Written Expression Written Expression: Not tested Oral Motor Oral Motor/Sensory Function Overall Oral Motor/Sensory Function: Mild impairment Facial ROM: Reduced right Motor Speech Overall Motor Speech: Appears within functional limits for tasks assessed  Care Tool Care Tool Cognition Ability to hear (with hearing aid or hearing appliances if normally used Ability to hear (with hearing aid or hearing appliances if normally used): 0. Adequate - no difficulty in normal conservation, social interaction, listening to TV   Expression of Ideas and Wants Expression of Ideas and Wants: 3. Some difficulty - exhibits some difficulty with  expressing needs and ideas (e.g, some words or finishing thoughts) or speech is not clear   Understanding Verbal and Non-Verbal Content Understanding Verbal and Non-Verbal Content: 3. Usually understands - understands most conversations, but misses some part/intent of message. Requires cues at times to understand  Memory/Recall Ability Memory/Recall Ability : That he or she is in a hospital/hospital unit   Short Term Goals: Week 1: SLP Short Term Goal 1 (Week 1): Patient will verbally express wants/needs at the sentence level with Mod I. SLP Short Term Goal 2 (Week 1): Patient will recall new, daily information with Min verbal and visual cues. SLP Short Term Goal 3 (Week 1): Patient will demonstrate functional problem soving for mildly complex tasks with Min verbal cues. SLP Short Term Goal 4 (Week 1): Patient will self-monitor and correct errors during functional tasks with Min verbal cues.  Refer to Care Plan for Long Term Goals  Recommendations for other services: Neuropsych  Discharge Criteria: Patient will be discharged from SLP if patient refuses treatment 3 consecutive times without medical reason, if treatment goals not met, if there is a change in medical status, if patient makes no progress towards goals or if patient is discharged from hospital.  The above assessment, treatment plan, treatment alternatives and goals were discussed and mutually agreed upon: by patient  Ellanor Feuerstein 05/17/2022, 3:18 PM
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
## 2022-05-17 NOTE — Progress Notes (Signed)
PROGRESS NOTE   Subjective/Complaints: Patient seen today while working with OT.  Bedside nurse assisted with repositioning patient in bed so that Left inner groin wound (Hidrandenitis Suppurativa) could be visualized.  No overnight complaints.  ROS: No CP, SOB, palpitations, N/V/C/D or visual changes.   Objective:   No results found. Recent Labs    05/17/22 0503  WBC 5.8  HGB 11.3*  HCT 34.6*  PLT 359   Recent Labs    05/17/22 0503  NA 134*  K 4.2  CL 103  CO2 23  GLUCOSE 125*  BUN 16  CREATININE 1.17  CALCIUM 8.9    Intake/Output Summary (Last 24 hours) at 05/17/2022 1721 Last data filed at 05/17/2022 1300 Gross per 24 hour  Intake 840 ml  Output 2550 ml  Net -1710 ml        Physical Exam: Vital Signs Blood pressure (!) 144/87, pulse 66, temperature 97.8 F (36.6 C), resp. rate 15, height 6\' 2"  (1.88 m), weight 113 kg, SpO2 100 %.    Assessment/Plan: 1. Functional deficits which require 3+ hours per day of interdisciplinary therapy in a comprehensive inpatient rehab setting. Physiatrist is providing close team supervision and 24 hour management of active medical problems listed below. Physiatrist and rehab team continue to assess barriers to discharge/monitor patient progress toward functional and medical goals  Care Tool:  Bathing  Bathing activity did not occur: Refused (already bathed with PT per report)           Bathing assist       Upper Body Dressing/Undressing Upper body dressing Upper body dressing/undressing activity did not occur (including orthotics): Refused (already donned shirt with PT)      Upper body assist      Lower Body Dressing/Undressing Lower body dressing      What is the patient wearing?: Pants     Lower body assist Assist for lower body dressing: Minimal Assistance - Patient > 75%     Toileting Toileting    Toileting assist Assist for toileting:  Minimal Assistance - Patient > 75% (standing at toilet to urinate x 2)     Transfers Chair/bed transfer  Transfers assist     Chair/bed transfer assist level: Minimal Assistance - Patient > 75%     Locomotion Ambulation   Ambulation assist      Assist level: Minimal Assistance - Patient > 75% Assistive device: Walker-rolling Max distance: 20   Walk 10 feet activity   Assist     Assist level: Minimal Assistance - Patient > 75% Assistive device: Walker-rolling   Walk 50 feet activity   Assist Walk 50 feet with 2 turns activity did not occur: Refused         Walk 150 feet activity   Assist Walk 150 feet activity did not occur: Refused         Walk 10 feet on uneven surface  activity   Assist Walk 10 feet on uneven surfaces activity did not occur: Refused         Wheelchair     Assist Is the patient using a wheelchair?: Yes Type of Wheelchair: Manual Wheelchair activity did not occur: Refused  Wheelchair 50 feet with 2 turns activity    Assist    Wheelchair 50 feet with 2 turns activity did not occur: Refused       Wheelchair 150 feet activity     Assist  Wheelchair 150 feet activity did not occur: Refused       Blood pressure (!) 144/87, pulse 66, temperature 97.8 F (36.6 C), resp. rate 15, height 6\' 2"  (1.88 m), weight 113 kg, SpO2 100 %.   General: Alert and oriented x 3, No apparent distress HEENT: Head is normocephalic, atraumatic, PERRLA, EOMI, sclera anicteric, oral mucosa pink and moist, dentition intact, ext ear canals clear,  Neck: Supple without JVD or lymphadenopathy Heart: Reg rate and rhythm. No murmurs rubs or gallops Chest: CTA bilaterally without wheezes, rales, or rhonchi; no distress Abdomen: Soft, non-tender, non-distended, bowel sounds positive. Extremities: No clubbing, cyanosis, or edema. Pulses are 2+ Psych: Pt's affect is appropriate. Pt is cooperative Skin: Clean in clean and  intact with exception of Left groin.  Presence of Hidradenitis Suppurativa adjacent to scrotum and on left thigh.  Side just next to scrotum has a 4-5 cm are that is indurated, red, with sanguinous drainage.  This area has a pungent smell. Area on upper left thigh is not draining but has skin pattern of dimpling with HS. Neuro: Cranial nerves II through XII intact.  Speech content is fluent but some mild hesitancy.  No aphasia present patient.  Patient able to recall 1 of 3 words with 3-minute wait between can follow commands and answer questions appropriately.  Sensation is intact to light touch for both bilateral and upper lower and lower extremities.  Strength 5 out of 5 in left upper and left lower extremity.  Strength 4+ out of 5 throughout the right upper extremity.  Strength 4 out of 5 proximal right lower extremity..  4+ out of 5 distal right lower extremity.  Normal coordination present Musculoskeletal: No range of motion deficits noted.  Muscle bulk and tone is normal.  No swelling or joint tenderness noticed.  PIV in right upper extremity  Medical Problem List and Plan: 1. 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             -patient may shower             -ELOS/Goals: 5-7 days, Mod I with PT/OT/SLP 2.  Antithrombotics: -DVT/anticoagulation:  Mechanical: Sequential compression devices, below knee Bilateral lower extremities             -antiplatelet therapy: Plavix and aspirin for 3 weeks followed by Plavix alone 6/10 continue dual antiplatelet therapy 3. Pain Management: Tramadol, Tylenol as needed 6/10 pain is well controlled today with current regimen 4. Mood: LCSW to evaluate and provide emotional support             -antipsychotic agents: n/a 5. Neuropsych: This patient is capable of making decisions on his own behalf. 6. Skin/Wound Care: Routine skin care checks             --  Hidradenitis R groin: Local wound care; follow-up PCP             -On Vanc and zosyn for 48 hours, consider de-escalation to augemtin 6/10 Left groin hidradenitis, continue antibiotics vancomycin and Zosyn per pharmacy consider de-escalation to Augmentin we will further discuss with Dr. 8/10 MD tomorrow in addition to discussing with pharmacy as well.  Wound visualized today does have foul smell to it is tender to palpation. 7. Fluids/Electrolytes/Nutrition: Routine ins and outs and follow-up chemistries 8: Hypertension: Norvasc 10 mg daily initiated post Cleviprex, Toprol-xl 25mg  -Has had a few mildly elevated BPs, continue to monitor trend    05/17/2022    2:54 PM 05/17/2022    4:00 AM 05/16/2022    7:39 PM  Vitals with BMI  Systolic 144 134 07/16/2022  Diastolic 87 99 86  Pulse 66 65 61  6/10 still with some elevated blood pressures, continue to trend with Toprol XL 25 mg. 9: Hyperlipidemia: Continue atorvastatin 80 mg daily 10: Tobacco use: Cessation counseling -Nicotine patch 11: Alcohol abuse: Cessation counseling -Thiamine supplementation, multivitamin, folic acid 6/10 No withdrawal symptoms today, cessation counseling 12: THC use: Advised regarding stroke risk 13: Obesity: Dietary recommendations    LOS: 1 days A FACE TO FACE EVALUATION WAS PERFORMED  381 05/17/2022, 5:21 PM

## 2022-05-17 NOTE — Plan of Care (Signed)
  Problem: RH Expression Communication Goal: LTG Patient will verbally express basic/complex needs(SLP) Description: LTG:  Patient will verbally express basic/complex needs, wants or ideas with cues  (SLP) Flowsheets (Taken 05/17/2022 1525) LTG: Patient will verbally express basic/complex needs, wants or ideas (SLP): Modified Independent   Problem: RH Problem Solving Goal: LTG Patient will demonstrate problem solving for (SLP) Description: LTG:  Patient will demonstrate problem solving for basic/complex daily situations with cues  (SLP) Flowsheets (Taken 05/17/2022 1525) LTG: Patient will demonstrate problem solving for (SLP): Complex daily situations LTG Patient will demonstrate problem solving for: Supervision   Problem: RH Memory Goal: LTG Patient will demonstrate ability for day to day (SLP) Description: LTG:   Patient will demonstrate ability for day to day recall/carryover during cognitive/linguistic activities with assist  (SLP) Flowsheets (Taken 05/17/2022 1525) LTG: Patient will demonstrate ability for day to day recall: New information LTG: Patient will demonstrate ability for day to day recall/carryover during cognitive/linguistic activities with assist (SLP): Supervision Goal: LTG Patient will use memory compensatory aids to (SLP) Description: LTG:  Patient will use memory compensatory aids to recall biographical/new, daily complex information with cues (SLP) Flowsheets (Taken 05/17/2022 1525) LTG: Patient will use memory compensatory aids to (SLP): Supervision   Problem: RH Awareness Goal: LTG: Patient will demonstrate awareness during functional activites type of (SLP) Description: LTG: Patient will demonstrate awareness during functional activites type of (SLP) Flowsheets (Taken 05/17/2022 1525) LTG: Patient will demonstrate awareness during cognitive/linguistic activities with assistance of (SLP): Supervision

## 2022-05-17 NOTE — Progress Notes (Signed)
Occupational Therapy Session Note  Patient Details  Name: James Lambert MRN: [id number] Date of Birth: 04-16-1966  Today's Date: 05/17/2022 OT Individual Time: 1515-1600 OT Individual Time Calculation (min): 45 min    Short Term Goals: Week 1:  OT Short Term Goal 1 (Week 1): Pt will complete 2/3 toileting steps with CGA OT Short Term Goal 2 (Week 1): Pt will donn LB clothing with CGA for balance using AE PRN OT Short Term Goal 3 (Week 1): Pt will tolerate stance position >5 mins in prep for ADL tasks   Therapy Documentation Precautions:  Precautions Precautions: Fall Restrictions Weight Bearing Restrictions: No General:  Upon OT arrival, pt semi recumbent in bed. Pt reports no pain and was agreeable to OT session. Pt states "I am having trouble with my thinking". Pt reports the urge to urinate and requests to use the bathroom. Pt completes supine > sit transfer with SBA and sit to stand with RW and CGA. Pt ambulates to bathroom with CGA and transfers to toilet with Min A with RW. Pt completes toileting with Min A. Pt completes toileting and toilet transfer a second time at the same levels at end of session. Pt completes hand hygiene standing at sink with Supervision and no LOB. Pt ambulates to w/c with CGA using RW and transfers into w/c with CGA. Therapist transported pt to therapy gym for time management. While seated at tabletop, pt was provided 2 designs to replicate of colored ping pong balls in an egg crate using B Ues to improve problem solving and higher level cognition during self care. Pt noted to have Min difficulty manipulating ping pong balls and requires increased time to complete. Pt able to correctly replicate 2/2 designs. Pt then completes standing task with RW and CGA retrieving playing cards in all planes with B UE and places card on matching card on velcro board located infornt of pt to improve dynamic standing balance and endurance. Pt demonstrates min difficulty  retrieving cards but had no LOB. Pt tolerates standing ~10 minutes without requiring seated rest break. Pt returned to seated position with CGA and was returned to his room to complete toileting as stated above. Pt returned to semi recumbent with SBA and was left with all safety measures in place.  ADL: ADL Grooming: Supervision/safety Where Assessed-Grooming: Standing at sink Toileting: Minimal assistance Where Assessed-Toileting: Teacher, adult education: Curator Method: Proofreader: Other (comment) (RW)  Therapy/Group: Individual Therapy  Carolin Sicks 05/17/2022, 4:44 PM
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
## 2022-05-17 NOTE — Plan of Care (Signed)
  Problem: RH Balance Goal: LTG Patient will maintain dynamic sitting balance (PT) Description: LTG:  Patient will maintain dynamic sitting balance with assistance during mobility activities (PT) Flowsheets (Taken 05/17/2022 1304) LTG: Pt will maintain dynamic sitting balance during mobility activities with:: Independent Goal: LTG Patient will maintain dynamic standing balance (PT) Description: LTG:  Patient will maintain dynamic standing balance with assistance during mobility activities (PT) Flowsheets (Taken 05/17/2022 1304) LTG: Pt will maintain dynamic standing balance during mobility activities with:: Supervision/Verbal cueing   Problem: RH Bed Mobility Goal: LTG Patient will perform bed mobility with assist (PT) Description: LTG: Patient will perform bed mobility with assistance, with/without cues (PT). Flowsheets (Taken 05/17/2022 1304) LTG: Pt will perform bed mobility with assistance level of: Independent with assistive device    Problem: RH Bed to Chair Transfers Goal: LTG Patient will perform bed/chair transfers w/assist (PT) Description: LTG: Patient will perform bed to chair transfers with assistance (PT). Flowsheets (Taken 05/17/2022 1304) LTG: Pt will perform Bed to Chair Transfers with assistance level: Independent with assistive device    Problem: RH Car Transfers Goal: LTG Patient will perform car transfers with assist (PT) Description: LTG: Patient will perform car transfers with assistance (PT). Flowsheets (Taken 05/17/2022 1304) LTG: Pt will perform car transfers with assist:: Supervision/Verbal cueing   Problem: RH Ambulation Goal: LTG Patient will ambulate in controlled environment (PT) Description: LTG: Patient will ambulate in a controlled environment, # of feet with assistance (PT). Flowsheets (Taken 05/17/2022 1304) LTG: Pt will ambulate in controlled environ  assist needed:: Supervision/Verbal cueing LTG: Ambulation distance in controlled environment: 110ft  with LRAD Goal: LTG Patient will ambulate in home environment (PT) Description: LTG: Patient will ambulate in home environment, # of feet with assistance (PT). Flowsheets (Taken 05/17/2022 1304) LTG: Pt will ambulate in home environ  assist needed:: Supervision/Verbal cueing LTG: Ambulation distance in home environment: 85ft with LRAD   Problem: RH Wheelchair Mobility Goal: LTG Patient will propel w/c in controlled environment (PT) Description: LTG: Patient will propel wheelchair in controlled environment, # of feet with assist (PT) Flowsheets (Taken 05/17/2022 1304) LTG: Pt will propel w/c in controlled environ  assist needed:: Supervision/Verbal cueing LTG: Propel w/c distance in controlled environment: 150   Problem: RH Stairs Goal: LTG Patient will ambulate up and down stairs w/assist (PT) Description: LTG: Patient will ambulate up and down # of stairs with assistance (PT) Flowsheets (Taken 05/17/2022 1304) LTG: Pt will  ambulate up and down number of stairs: 5 steps with BUE support

## 2022-05-17 NOTE — Progress Notes (Signed)
Inpatient Rehabilitation Admission Medication Review by a Pharmacist  A complete drug regimen review was completed for this patient to identify any potential clinically significant medication issues.  High Risk Drug Classes Is patient taking? Indication by Medication  Antipsychotic Yes Prochlorperazine prn for nausea  Anticoagulant Yes Enoxaparin for VTE ppx  Antibiotic Yes, as an intravenous medication IV Vancomycin and Zosyn for wound infection  Opioid Yes Tramadol for pain  Antiplatelet Yes Aspirin 81 mg daily (end date 06/06/22) and  Clopidogrel 75 mg daily for left ICA with TICI3 revascularization  Hypoglycemics/insulin No   Vasoactive Medication Yes Amlodipine, Metoprolol succinate for HTN  Chemotherapy No   Other Yes Atorvastatin for HLD Baclofen for pain/muscle spasms Pantoprazole for GERD Trazodone prn for sleep     Type of Medication Issue Identified Description of Issue Recommendation(s)  Drug Interaction(s) (clinically significant)     Duplicate Therapy     Allergy     No Medication Administration End Date  IV Vancomycin and Zosyn - no end dates for current orders Discussed with MD - will re-evaluate wound tomorrow and re-assess if able to de-escalate to oral antibiotics  Incorrect Dose     Additional Drug Therapy Needed     Significant med changes from prior encounter (inform family/care partners about these prior to discharge).    Other       Clinically significant medication issues were identified that warrant physician communication and completion of prescribed/recommended actions by midnight of the next day:  Yes - no further action needed, will follow up with MD tomorrow regarding antibiotics.   Name of provider notified for urgent issues identified: Dr. Riley Kill  Provider Method of Notification: Secure chat  Pharmacist comments: Message sent to MD regarding no end dates for Aspirin and IV antibiotics (Vancomycin and Zosyn). Per MD - ok to add stop date of 3  weeks (06/06/22) for Aspirin per neurology. MD to re-evaluate the patient's wound tomorrow and re-assess if able to de-escalate to oral antibiotics.   Time spent performing this drug regimen review (minutes):  20   Netta Neat 05/17/2022 11:21 AM

## 2022-05-18 NOTE — Progress Notes (Signed)
PROGRESS NOTE   Subjective/Complaints: Patient seen today in bed.  Assessed wounds on right and left thighs and buttocks related to (Hidrandenitis Suppurativa).  Today it appears wounds are present on both right and left ground with red bloody drainage.  Wound odor is less foul smelling today than yesterday.  ROS: No CP, SOB, palpitations, N/V/C/D or visual changes.   Objective:   No results found. Recent Labs    05/17/22 0503  WBC 5.8  HGB 11.3*  HCT 34.6*  PLT 359   Recent Labs    05/17/22 0503  NA 134*  K 4.2  CL 103  CO2 23  GLUCOSE 125*  BUN 16  CREATININE 1.17  CALCIUM 8.9    Intake/Output Summary (Last 24 hours) at 05/18/2022 1406 Last data filed at 05/18/2022 0745 Gross per 24 hour  Intake 1956.7 ml  Output 1400 ml  Net 556.7 ml        Physical Exam: Vital Signs Blood pressure (!) 126/92, pulse 77, temperature 98.4 F (36.9 C), resp. rate 17, height 6\' 2"  (1.88 m), weight 113 kg, SpO2 100 %.    Assessment/Plan: 1. Functional deficits which require 3+ hours per day of interdisciplinary therapy in a comprehensive inpatient rehab setting. Physiatrist is providing close team supervision and 24 hour management of active medical problems listed below. Physiatrist and rehab team continue to assess barriers to discharge/monitor patient progress toward functional and medical goals  Care Tool:  Bathing  Bathing activity did not occur: Refused (already bathed with PT per report)           Bathing assist       Upper Body Dressing/Undressing Upper body dressing Upper body dressing/undressing activity did not occur (including orthotics): Refused (already donned shirt with PT)      Upper body assist      Lower Body Dressing/Undressing Lower body dressing      What is the patient wearing?: Pants     Lower body assist Assist for lower body dressing: Minimal Assistance - Patient > 75%      Toileting Toileting    Toileting assist Assist for toileting: Minimal Assistance - Patient > 75% (standing at toilet to urinate x 2)     Transfers Chair/bed transfer  Transfers assist     Chair/bed transfer assist level: Minimal Assistance - Patient > 75%     Locomotion Ambulation   Ambulation assist      Assist level: Minimal Assistance - Patient > 75% Assistive device: Walker-rolling Max distance: 20   Walk 10 feet activity   Assist     Assist level: Minimal Assistance - Patient > 75% Assistive device: Walker-rolling   Walk 50 feet activity   Assist Walk 50 feet with 2 turns activity did not occur: Refused         Walk 150 feet activity   Assist Walk 150 feet activity did not occur: Refused         Walk 10 feet on uneven surface  activity   Assist Walk 10 feet on uneven surfaces activity did not occur: Refused         Wheelchair     Assist Is the patient  using a wheelchair?: Yes Type of Wheelchair: Manual Wheelchair activity did not occur: Refused         Wheelchair 50 feet with 2 turns activity    Assist    Wheelchair 50 feet with 2 turns activity did not occur: Refused       Wheelchair 150 feet activity     Assist  Wheelchair 150 feet activity did not occur: Refused       Blood pressure (!) 126/92, pulse 77, temperature 98.4 F (36.9 C), resp. rate 17, height 6\' 2"  (1.88 m), weight 113 kg, SpO2 100 %.   General: Alert and oriented x 3, No apparent distress HEENT: Head is normocephalic, atraumatic, PERRLA, EOMI, sclera anicteric, oral mucosa pink and moist, dentition intact, ext ear canals clear,  Neck: Supple without JVD or lymphadenopathy Heart: Reg rate and rhythm. No murmurs rubs or gallops Chest: CTA bilaterally without wheezes, rales, or rhonchi; no distress Abdomen: Soft, non-tender, non-distended, bowel sounds positive. Extremities: No clubbing, cyanosis, or edema. Pulses are 2+ Psych: Pt's  affect is appropriate. Pt is cooperative Skin: Clean in clean and intact with exception of Left and Right groin.  Presence of Hidradenitis Suppurativa adjacent to scrotum and on left thigh and right thigh  Side just next to scrotum on both sided has a 4-5 cm are that is indurated, red, with sanguinous drainage.  This area has a pungent smell, but improved from yesterday. Area on upper left thigh is not draining but has skin pattern of dimpling with HS.  On bilateral buttocks has three areas covered with foam pads with dimpling pattern of HS minimal sanguinous drainage. Neuro: Cranial nerves II through XII intact.  Speech content is fluent but some mild hesitancy.  No aphasia present patient.  Patient able to recall 1 of 3 words with 3-minute wait between can follow commands and answer questions appropriately.  Sensation is intact to light touch for both bilateral and upper lower and lower extremities.  Strength 5 out of 5 in left upper and left lower extremity.  Strength 4+ out of 5 throughout the right upper extremity.  Strength 4 out of 5 proximal right lower extremity..  4+ out of 5 distal right lower extremity.  Normal coordination present Musculoskeletal: No range of motion deficits noted.  Muscle bulk and tone is normal.  No swelling or joint tenderness noticed.  PIV in right upper extremity  Medical Problem List and Plan: 1. 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             -patient may shower             -ELOS/Goals: 5-7 days, Mod I with PT/OT/SLP 2.  Antithrombotics: -DVT/anticoagulation:  Mechanical: Sequential compression devices, below knee Bilateral lower extremities             -antiplatelet therapy: Plavix and aspirin for 3 weeks followed by Plavix alone 6/10 continue dual antiplatelet therapy 3. Pain Management: Tramadol, Tylenol as needed 6/10 pain is well controlled today with  current regimen 4. Mood: LCSW to evaluate and provide emotional support             -antipsychotic agents: n/a 5. Neuropsych: This patient is capable of making decisions on his own behalf. 6. Skin/Wound Care: Routine skin care checks             -- Hidradenitis R groin: Local wound care; follow-up PCP             -  On Vanc and zosyn for 48 hours, consider de-escalation to augemtin 6/10 Left groin hidradenitis, continue antibiotics vancomycin and Zosyn per pharmacy consider de-escalation to Augmentin we will further discuss with Dr. Riley Kill MD tomorrow in addition to discussing with pharmacy as well.  Wound visualized today does have foul smell to it is tender to palpation. 6/11 Appears that HS is on both right and left groin area with extension to bilateral thighs and buttocks.  Area directly adjacent to scrotum is raw with bloody drainage, smell less foul today than yesterday.  But due to opens wounds continue IV antibiotics today and overnight.  Primary team to decide to convert over to po antibiotics. 7. Fluids/Electrolytes/Nutrition: Routine ins and outs and follow-up chemistries 8: Hypertension: Norvasc 10 mg daily initiated post Cleviprex, Toprol-xl 25mg  -Has had a few mildly elevated BPs, continue to monitor trend    05/18/2022    9:26 AM 05/18/2022    3:56 AM 05/17/2022    7:19 PM  Vitals with BMI  Systolic  126 137  Diastolic  92 70  Pulse 77 59 56  6/10 still with some elevated blood pressures, continue to trend with Toprol XL 25 mg. 6/11 SBP's 120's-130's on Toprol XL 25 mg daily. 9: Hyperlipidemia: Continue atorvastatin 80 mg daily 10: Tobacco use: Cessation counseling -Nicotine patch 11: Alcohol abuse: Cessation counseling -Thiamine supplementation, multivitamin, folic acid 6/11 No withdrawal symptoms today, cessation counseling 12: THC use: Advised regarding stroke risk 13: Obesity: Dietary recommendations    LOS: 2 days A FACE TO FACE EVALUATION WAS PERFORMED  8/11 05/18/2022, 2:06 PM

## 2022-05-19 LAB — BASIC METABOLIC PANEL
Anion gap: 5 (ref 5–15)
BUN: 13 mg/dL (ref 6–20)
CO2: 27 mmol/L (ref 22–32)
Calcium: 8.5 mg/dL — ABNORMAL LOW (ref 8.9–10.3)
Chloride: 103 mmol/L (ref 98–111)
Creatinine, Ser: 1.3 mg/dL — ABNORMAL HIGH (ref 0.61–1.24)
GFR, Estimated: >60 mL/min (ref >60)
Glucose, Bld: 96 mg/dL (ref 70–99)
Potassium: 4.3 mmol/L (ref 3.5–5.1)
Sodium: 135 mmol/L (ref 135–145)

## 2022-05-19 LAB — CBC
HCT: 33 % — ABNORMAL LOW (ref 39.0–52.0)
Hemoglobin: 10.9 g/dL — ABNORMAL LOW (ref 13.0–17.0)
MCH: 30.7 pg (ref 26.0–34.0)
MCHC: 33 g/dL (ref 30.0–36.0)
MCV: 93 fL (ref 80.0–100.0)
Platelets: 387 10*3/uL (ref 150–400)
RBC: 3.55 MIL/uL — ABNORMAL LOW (ref 4.22–5.81)
RDW: 13.9 % (ref 11.5–15.5)
WBC: 5.4 10*3/uL (ref 4.0–10.5)
nRBC: 0 % (ref 0.0–0.2)

## 2022-05-19 MED ORDER — CLINDAMYCIN PHOSPHATE 1 % EX GEL
Freq: Two times a day (BID) | CUTANEOUS | Status: DC
Start: 2022-05-19 — End: 2022-05-27
  Administered 2022-05-27: 1 via TOPICAL
  Filled 2022-05-19 (×2): qty 30

## 2022-05-19 MED ORDER — DOXYCYCLINE HYCLATE 100 MG PO TABS
100.0000 mg | ORAL_TABLET | Freq: Two times a day (BID) | ORAL | Status: DC
  Administered 2022-05-19 – 2022-05-27 (×16): 100 mg via ORAL
  Filled 2022-05-19 (×16): qty 1

## 2022-05-19 NOTE — Progress Notes (Signed)
Inpatient Rehabilitation Care Coordinator Assessment and Plan Patient Details  Name: James Lambert MRN: [id number] Date of Birth: [birth year]  Today's Date: 05/19/2022  Hospital Problems: Principal Problem:   Ischemic stroke of frontal lobe [facility])  Past Medical History:  Past Medical History:  Diagnosis Date   Asthma    COPD (chronic obstructive pulmonary disease) ([place])    Hidradenitis suppurativa    diagnosed in [facility] ED based on history and physical exam   Hypertension    Past Surgical History:  Past Surgical History:  Procedure Laterality Date   IR CT HEAD LTD  05/10/2022   IR FLUORO GUIDED NEEDLE PLC ASPIRATION/INJECTION LOC  03/20/2022   IR PERCUTANEOUS ART THROMBECTOMY/INFUSION INTRACRANIAL INC DIAG ANGIO  05/10/2022   IR US GUIDE VASC ACCESS RIGHT  05/10/2022   RADIOLOGY WITH ANESTHESIA N/A 05/10/2022   Procedure: IR WITH ANESTHESIA;  Surgeon: Luanne Bras, MD;  Location: [place];  Service: Radiology;  Laterality: N/A;   TEE WITHOUT CARDIOVERSION N/A 03/11/2022   Procedure: TRANSESOPHAGEAL ECHOCARDIOGRAM (TEE);  Surgeon: Corey Skains, MD;  Location: [facility];  Service: Cardiovascular;  Laterality: N/A;   Social History:  reports that he has been smoking cigarettes. He has been smoking an average of .5 packs per day. He has never used smokeless tobacco. He reports current alcohol use. He reports current drug use. Drug: Marijuana.  Family / Support Systems Marital Status: Single Spouse/Significant Other: N/A Children: No children Other Supports: Brothers- James Lambert and James Lambert Anticipated Caregiver: Pt brother James Lambert who can only provide supervision due to his own disability. PRN support from James Lambert/Limitations of Caregiver: See above Caregiver Availability: 24/7 Family Dynamics: Pt lives with his brother James Lambert.  Social History Preferred language: English Religion: None Cultural Background: Pt worked in blue collar job roles. Education: high school grad Health Literacy -  How often do you need to have someone help you when you read instructions, pamphlets, or other written material from your doctor or pharmacy?: Never Writes: Yes Employment Status: Unemployed Date Retired/Disabled/Unemployed: Pt reports he has not worked in a few years. last job was at [employer]. Legal History/Current Legal Issues: Admits to jail and prison time. reports his longest sentence 18 months. Reports being released from probation a few years ago. Guardian/Conservator: N/A   Abuse/Neglect Abuse/Neglect Assessment Can Be Completed: Yes Physical Abuse: Denies Verbal Abuse: Denies Sexual Abuse: Denies Exploitation of patient/patient's resources: Denies Self-Neglect: Denies  Patient response to: Social Isolation - How often do you feel lonely or isolated from those around you?: Never  Emotional Status Pt's affect, behavior and adjustment status: Pt in good spirits at time of visit. Pt continues to present with confusion as often repeating the same sentence continually with difficult answering open ended questions. Recent Psychosocial Issues: Denies Psychiatric History: Denies Substance Abuse History: Pt admits that he smokes 1ppd and has been smoking for almost 20 years. States he also drinks beer and liquor and last drink was a week before his stroke. reprots he smokes marijuana and last use 2 wks ago.  Patient / Family Perceptions, Expectations & Goals Pt/Family understanding of illness & functional limitations: Pt family has a general understanding of his care needs Premorbid pt/family roles/activities: Inpdendent with some assistance with cognitive functioning Anticipated changes in roles/activities/participation: Assistance with ADLs/IADLs Pt/family expectations/goals: Pt goal is to work on gettin self back such as walking.  Community Resources Express Scripts: None Premorbid Home Care/DME Agencies: None Transportation available at discharge: TBD Is the patient able to  respond to  transportation needs?: Yes In the past 12 months, has lack of transportation kept you from medical appointments or from getting medications?: No In the past 12 months, has lack of transportation kept you from meetings, work, or from getting things needed for daily living?: No Resource referrals recommended: Neuropsychology  Discharge Planning Living Arrangements: Other relatives Support Systems: Other relatives Type of Residence: Private residence Insurance Resources: Teacher, adult education Resources: Family Support Financial Screen Referred: Yes (currently not eligible for MCD at this time due to no 12 month disability per first source.) Living Expenses: Lives with family Money Management: Family Does the patient have any problems obtaining your medications?: No Home Management: Pt helpes managed home care (cooking and cleaning). Patient/Family Preliminary Plans: TBD Care Coordinator Barriers to Discharge: Insurance for SNF coverage, Lack of/limited family support, Decreased caregiver support, Other (comments) Care Coordinator Barriers to Discharge Comments: pt is uninsured Care Coordinator Anticipated Follow Up Needs: HH/OP Expected length of stay: 10-12 days  Clinical Impression SW met with pt in room at bedside to introduce self, explain role, and discuss discharge process. Pt is not a English as a second language teacher. No HCPOA. No DME.   Levana Minetti A Cesario Weidinger 05/19/2022, 3:21 PM

## 2022-05-19 NOTE — Progress Notes (Signed)
Occupational Therapy Session Note  Patient Details  Name: James Lambert MRN: [id number] Date of Birth: 1966/10/27  Today's Date: 05/19/2022 OT Individual Time: 8563-1497 OT Individual Time Calculation (min): 70 min    Short Term Goals: Week 1:  OT Short Term Goal 1 (Week 1): Pt will complete 2/3 toileting steps with CGA OT Short Term Goal 2 (Week 1): Pt will donn LB clothing with CGA for balance using AE PRN OT Short Term Goal 3 (Week 1): Pt will tolerate stance position >5 mins in prep for ADL tasks  Skilled Therapeutic Interventions/Progress Updates:    Pt resting in bed upon arrival and agreeable to OOB activities. Supine>sit EOB with supervision using bed rails. Sit>stand from EOB with RW and amb 5' to w/c with CGA. No c/o dizziness. Pt practiced TTB transfers with CGA. Standing activity at rebounder. Pt tossed soccer ball 2x15 with CGA. Pt attempted to stand on Airex and toss ball but was unable to maintain balance. Pt transitioned to day room and engaged in standing activity on Airex retrieving/replacing cards on card board-CGA. Pt donned gloves to engage in cleaning cards. Pt was able to don Lt glove without assitance but exhibited some RUE apraxia and required assistance with Rt glove. Pt stood on Airex to clean cards but was unable to remove BUE simultaneously. Pt sat back into w/c. Pt returned to room and transferred to w/c with CGA. Sit>supine with supervisoin. Pt remained in bed with all needs within reach and bed alarm activated.   Therapy Documentation Precautions:  Precautions Precautions: Fall Restrictions Weight Bearing Restrictions: No  Pain:  Pt c/o Bil knee pain but did not request meds; repositioned   Therapy/Group: Individual Therapy  [name] 05/19/2022, 12:05 PM

## 2022-05-19 NOTE — Discharge Summary (Signed)
Physician Discharge Summary  Patient ID: James Lambert MRN: [id number] DOB/AGE: [birth year] 55 y.o.  Admit date: 05/16/2022 Discharge date: 05/27/2022  Discharge Diagnoses:  Principal Problem:   Ischemic stroke of frontal lobe [facility]) Active problems: Functional deficits secondary to acute cerebral infarcts Alcohol abuse Hypertension Hyperlipidemia Tobacco use Obesity Right groin hidradenitis  Discharged Condition: good  Significant Diagnostic Studies: Labs:  Basic Metabolic Panel: Recent Labs  Lab 05/22/22 0514 05/26/22 0615  NA 136 135  K 4.1 4.2  CL 101 98  CO2 26 23  GLUCOSE 99 97  BUN 16 19  CREATININE 1.18 1.16  CALCIUM 9.4 9.5    CBC: Recent Labs  Lab 05/22/22 0514 05/26/22 0615  WBC 4.4 5.9  HGB 12.1* 11.8*  HCT 35.9* 35.6*  MCV 92.1 91.8  PLT 390 381    CBG: No results for input(s): "GLUCAP" in the last 168 hours.  Brief HPI:   James Lambert is a 56 y.o. male who developed right facial droop and decreased level consciousness on 05/10/2022.  He was also intoxicated at the time.  He was brought to [facility] emergency department and code stroke called.  He was within the window for tenecteplase and this was administered.  CTA of the head and neck was positive for emergent large vessel occlusion of the left ICA and he underwent thrombectomy by neuro interventional radiology.  Was monitored in the intensive care unit and underwent swallow study.  He was started on Plavix and aspirin.  He has an approximately 6-week history of left groin hidradenitis and hospitalist service was consulted prior to discharge.  He was started on IV antibiotics.   Hospital Course: CALEM Lambert was admitted to rehab 05/16/2022 for inpatient therapies to consist of PT, ST and OT at least three hours five days a week. Past admission physiatrist, therapy team and rehab RN have worked together to provide customized collaborative inpatient rehab.  On 6/12, the patient  developed acute nausea with vomiting.  He has remained afebrile with normal white blood cell count.  General surgery consulted for management of groin hidradenitis and antibiotic therapy. No indication for surgical intervention or continued IV antibiotics. Start doxycycline 100 mg BID and clindamycin topical gel. Had significant nausea with anorexia that improved with switching to PO anti-biotics and using Compazine. Pain remained well controlled with Baclofen TID and Tylenol/Tramadol as needed.   Blood pressures were monitored on TID basis and remained stable on Norvasc 10 mg daily and Toprol-XL 25 mg daily>>increased to 50 mg daily on 6/17   Rehab course: During patient's stay in rehab weekly team conferences were held to monitor patient's progress, set goals and discuss barriers to discharge. At admission, patient required min assist with mobility min assist for most ADLs; total assist with footwear.  He has had improvement in activity tolerance, balance, postural control as well as ability to compensate for deficits. He has had improvement in functional use RUE/LUE  and RLE/LLE as well as improvement in awareness. Gait with RW x 100 feet times 2, supervision.   Disposition: home Discharge disposition: 01-Home or Self Care     Diet: Heart healthy  Special Instructions:  No driving, alcohol consumption or tobacco use.   30-35 minutes were spent on discharge planning and discharge summary.  Discharge Instructions     Ambulatory referral to Neurology   Complete by: As directed    An appointment is requested in approximately: 8 weeks   Ambulatory referral to Physical Medicine  Rehab   Complete by: As directed    Hospital follow-up   Discharge patient   Complete by: As directed    Discharge disposition: 01-Home or Self Care   Discharge patient date: 05/27/2022      Allergies as of 05/27/2022   No Known Allergies      Medication List     STOP taking these medications     piperacillin-tazobactam 3.375 GM/50ML IVPB Commonly known as: ZOSYN   thiamine 100 MG tablet Commonly known as: Vitamin B-1 Replaced by: thiamine 100 MG tablet   vancomycin 1-5 GM/200ML-% Soln Commonly known as: VANCOCIN       TAKE these medications    acetaminophen 325 MG tablet Commonly known as: TYLENOL Take 1-2 tablets (325-650 mg total) by mouth every 4 (four) hours as needed for mild pain.   amLODipine 10 MG tablet Commonly known as: NORVASC Take 1 tablet (10 mg total) by mouth once daily.   aspirin EC 81 MG tablet Take 1 tablet (81 mg total) by mouth daily for 4 days. Swallow whole. Start taking on: May 28, 2022   atorvastatin 80 MG tablet Commonly known as: LIPITOR Take 1 tablet (80 mg total) by mouth once daily.   Baclofen 5 MG Tabs Take 1 tablet by mouth 3 (three) times daily. (Take 5 mg by mouth 3 (three) times daily.)   clindamycin 1 % gel Commonly known as: Clindagel Apply topically 2 (two) times daily.   clopidogrel 75 MG tablet Commonly known as: PLAVIX Take 1 tablet (75 mg total) by mouth daily.   doxycycline 100 MG tablet Commonly known as: VIBRA-TABS Take 1 tablet (100 mg total) by mouth every 12 (twelve) hours.   folic acid 1 MG tablet Commonly known as: FOLVITE Take 1 tablet (1 mg total) by mouth daily.   metoprolol succinate 50 MG 24 hr tablet Commonly known as: TOPROL-XL Take 1 tablet (50 mg total) by mouth daily. Take with or immediately following a meal. Start taking on: May 28, 2022 What changed:  medication strength how much to take additional instructions   multivitamin with minerals Tabs tablet Take 1 tablet by mouth daily.   nicotine 14 mg/24hr patch Commonly known as: NICODERM CQ - dosed in mg/24 hours Place 1 patch (14 mg total) onto the skin daily. Start taking on: May 28, 2022   pantoprazole 40 MG tablet Commonly known as: PROTONIX Take 1 tablet (40 mg total) by mouth at bedtime.   senna-docusate 8.6-50 MG  tablet Commonly known as: Senokot-S Take 1 tablet by mouth 2 (two) times daily.   thiamine 100 MG tablet Take 1 tablet (100 mg total) by mouth daily. Replaces: thiamine 100 MG tablet   traMADol 50 MG tablet Commonly known as: ULTRAM Take 1 tablet (50 mg total) by mouth every 6 (six) hours as needed.        Follow-up Information     [facility] Follow up today.   Specialty: General Surgery Why: as needed for hidradenitis.  or you can follow up with a dermatologist who can treat this as well Contact information: [address] [place] [address] [phone]         Fanny DanceShtridelman, Yuri, MD Follow up.   Specialty: Physical Medicine and Rehabilitation Why: office will call you to arrange your appt (sent) Contact information: [address] [place] [address] [phone]         [facility] Follow up.   Why: Call tomorrow  to make arrangements for hospital follow-up appointment Contact information: [address]     [address] [place] [address] [phone]        Hoy Register, MD. Go to.   Specialty: Family Medicine Why: 06/19/2022 at 9:30 am Contact information: [address] [place] [address] [phone]                 Signed: Milinda Antis 05/27/2022, 11:05 AM

## 2022-05-19 NOTE — Progress Notes (Addendum)
Patient ID: James Lambert, male   DOB: Oct 24, 1966, 56 y.o.   MRN: [id number]  1220pm- SW spoke with pt brother [name] to introduce self, explain role, and discuss discharge process. SW shared ELOS. Pt does not have a PCP. Reports he would like to be primary contact as his brother has short term memory loss and often will not share information with him. SW shared that according to first source he did not meet 12 month disability. SW shared will confirm if he will have a disability greater than 12 months with physician. Reports they have applied for SSDI and it is currently pending at this time.   SW spoke with [facility] [phone]) to schedule hospital follow-up. Pt schedule for Thursday, July 31 at 9:30am with Dr. Alvis Lemmings. Requested to speak with pt brother [name] to schedule. SW also asked to discuss financial assistance and orange card.   *SW completed assessment and provided pt with appt card for PCP visit.   Cecile Sheerer, MSW, LCSWA Office: [phone] Cell: [phone] Fax: [phone]

## 2022-05-19 NOTE — IPOC Note (Signed)
Overall Plan of Care [facility]) Patient Details Name: James Lambert MRN: [id number] DOB: 1966/11/13  Admitting Diagnosis: Ischemic stroke of frontal lobe [facility])  Hospital Problems: Principal Problem:   Ischemic stroke of frontal lobe [facility])     Functional Problem List: Nursing Edema, Endurance, Medication Management, Motor, Pain, Safety  PT Balance, Behavior, Edema, Endurance, Motor, Perception, Safety, Sensory, Skin Integrity  OT Balance, Cognition, Edema, Endurance, Motor, Pain, Safety, Vision  SLP Cognition, Linguistic  TR         Basic ADL's: OT Grooming, Bathing, Dressing, Toileting     Advanced  ADL's: OT       Transfers: PT Bed Mobility, Bed to Chair, Car, Furniture, Civil Service fast streamer, Research scientist (life sciences): PT Ambulation, Psychologist, prison and probation services, Stairs     Additional Impairments: OT None  SLP Communication, Social Cognition expression Problem Solving, Memory, Awareness  TR      Anticipated Outcomes Item Anticipated Outcome  Self Feeding Mod I  Swallowing      Basic self-care  Marketing executive Transfers Supervision  Bowel/Bladder  n/a  Transfers  Mod I with RW  Locomotion  supervision assist ambulatory with LRAD  Communication  Mod I  Cognition  Supervision-Mod I  Pain  < 3  Safety/Judgment  supervision   Therapy Plan: PT Intensity: Minimum of 1-2 x/day ,45 to 90 minutes PT Frequency: 5 out of 7 days PT Duration Estimated Length of Stay: 10-14 days OT Intensity: Minimum of 1-2 x/day, 45 to 90 minutes OT Frequency: 5 out of 7 days OT Duration/Estimated Length of Stay: 10-12 days SLP Intensity: Minumum of 1-2 x/day, 30 to 90 minutes SLP Frequency: 3 to 5 out of 7 days SLP Duration/Estimated Length of Stay: 10-12 days   Team Interventions: Nursing Interventions Patient/Family Education, Disease Management/Prevention, Pain Management, Medication Management, Discharge Planning  PT interventions Ambulation/gait  training, Balance/vestibular training, Cognitive remediation/compensation, Community reintegration, Functional electrical stimulation, DME/adaptive equipment instruction, Disease management/prevention, Discharge planning, Functional mobility training, Neuromuscular re-education, Pain management, Patient/family education, Stair training, Psychosocial support, Therapeutic Activities, Therapeutic Exercise, Skin care/wound management, Splinting/orthotics, UE/LE Strength taining/ROM, UE/LE Coordination activities, Visual/perceptual remediation/compensation, Wheelchair propulsion/positioning  OT Interventions Balance/vestibular training, Discharge planning, Pain management, Self Care/advanced ADL retraining, Therapeutic Activities, UE/LE Coordination activities, Cognitive remediation/compensation, Functional mobility training, Patient/family education, Therapeutic Exercise, Visual/perceptual remediation/compensation, DME/adaptive equipment instruction, Neuromuscular re-education, UE/LE Strength taining/ROM, Wheelchair propulsion/positioning  SLP Interventions Cognitive remediation/compensation, Internal/external aids, Speech/Language facilitation, Financial trader, Environmental controls, Therapeutic Activities, Functional tasks, Patient/family education  TR Interventions    SW/CM Interventions Discharge Planning, Psychosocial Support, Patient/Family Education   Barriers to Discharge MD  Medical stability, Home enviroment access/loayout, IV antibiotics, Wound care, Lack of/limited family support, Weight, and Nutritional means  Nursing Decreased caregiver support, Home environment access/layout, Lack of/limited family support, Insurance for SNF coverage 1 level apartment, 2 steps, right rail. Apartment vs Group Home. Brother disabled, supervision only. Aide in home 8 hrs. 2nd brother to assist as needed.  PT Inaccessible home environment, Decreased caregiver support, Home environment access/layout, Wound Care,  Insurance for SNF coverage    OT Decreased caregiver support, Home environment access/layout    SLP      SW       Team Discharge Planning: Destination: PT-Home ,OT- Home (to brother's apartment) , SLP-Home Projected Follow-up: PT-Home health PT, OT-  Home health OT, 24 hour supervision/assistance, SLP-Outpatient SLP (intermittent supervision) Projected Equipment Needs: PT-Rolling walker with 5" wheels, To be determined, OT- Tub/shower bench, To  be determined, SLP-None recommended by SLP Equipment Details: PT- , OT-  Patient/family involved in discharge planning: PT- Patient,  OT-Patient, SLP-Patient  MD ELOS: 10-14 days Medical Rehab Prognosis:  Good Assessment: The patient has been admitted for CIR therapies with the diagnosis of L frontal lobe stroke with R hemiparesis and hidradenitis. The team will be addressing functional mobility, strength, stamina, balance, safety, adaptive techniques and equipment, self-care, bowel and bladder mgt, patient and caregiver education, wound care. Goals have been set at supervison. Anticipated discharge destination is home.        See Team Conference Notes for weekly updates to the plan of care

## 2022-05-19 NOTE — Care Management (Signed)
Inpatient Rehabilitation Center Individual Statement of Services  Patient Name:  James Lambert  Date:  05/19/2022  Welcome to the Inpatient Rehabilitation Center.  Our goal is to provide you with an individualized program based on your diagnosis and situation, designed to meet your specific needs.  With this comprehensive rehabilitation program, you will be expected to participate in at least 3 hours of rehabilitation therapies Monday-Friday, with modified therapy programming on the weekends.  Your rehabilitation program will include the following services:  Physical Therapy (PT), Occupational Therapy (OT), Speech Therapy (ST), 24 hour per day rehabilitation nursing, Therapeutic Recreaction (TR), Psychology, Neuropsychology, Care Coordinator, Rehabilitation Medicine, Nutrition Services, Pharmacy Services, and Other  Weekly team conferences will be held on Tuesdays to discuss your progress.  Your Inpatient Rehabilitation Care Coordinator will talk with you frequently to get your input and to update you on team discussions.  Team conferences with you and your family in attendance may also be held.  Expected length of stay: 10-12 days    Overall anticipated outcome: Supervision  Depending on your progress and recovery, your program may change. Your Inpatient Rehabilitation Care Coordinator will coordinate services and will keep you informed of any changes. Your Inpatient Rehabilitation Care Coordinator's name and contact numbers are listed  below.  The following services may also be recommended but are not provided by the Inpatient Rehabilitation Center:  Driving Evaluations Home Health Rehabiltiation Services Outpatient Rehabilitation Services Vocational Rehabilitation   Arrangements will be made to provide these services after discharge if needed.  Arrangements include referral to agencies that provide these services.  Your insurance has been verified to be:  Uninsured  Your primary doctor  is:  No PCP  Pertinent information will be shared with your doctor and your insurance company.  Inpatient Rehabilitation Care Coordinator:  Susie Cassette [phone] or ([phone]  Information discussed with and copy given to patient by: Gretchen Short, 05/19/2022, 12:14 PM

## 2022-05-19 NOTE — Progress Notes (Addendum)
Discussed hidradenitis therapy recs with pharmacist. Discontinued IV abx and started Vibramycin 100 mg BID and clindamycin gel topical.

## 2022-05-19 NOTE — Progress Notes (Signed)
Physical Therapy Session Note  Patient Details  Name: KADEEM HYLE MRN: [id number] Date of Birth: February 22, 1966  Today's Date: 05/19/2022 PT Individual Time: 0900-0950; 1601-0932 PT Individual Time Calculation (min): 50 min and 57 min  Short Term Goals: Week 1:  PT Short Term Goal 1 (Week 1): Pt will ambulate with RW 266ft with CGA PT Short Term Goal 2 (Week 1): Pt will perform Berg balance scale to assess safety and function PT Short Term Goal 3 (Week 1): Pt will propell WC 121ft with supervision assist PT Short Term Goal 4 (Week 1): Pt will transfer to and from [facility] with supervision assist consistnetly  Skilled Therapeutic Interventions/Progress Updates:    Session 1: Pt received seated on toilet handed off from NT. Pt able to continently void urine and BM while seated on toilet, NT updated Flowsheet. Sit to stand with min A to RW from elevated BSC over toilet. Pt is setup A for pericare. Assist needed to don new Depends and to replace soiled Mepilex in groin region and along buttocks at sites of wounds. Also assisted pt with donning clean scrub pants and clean socks. Ambulatory transfer to sink with RW and CGA. Pt able to maintain standing balance x 5 min while washing hands and performing oral hygiene with Supervision. Attempt ambulation to therapy gym, pt reports onset of dizziness and reports he had some dizziness and N/V earlier this AM. Returned to sitting in w/c. Seated BP 129/94. Pt reports some improvement in symptoms with seated rest break. Sit to stand with min A to RW from low w/c seat height. Ambulation x 70 ft with RW and CGA for balance with close w/c follow due to dizziness. No increase in symptoms during gait. Pt exhibits somewhat shuffling gait pattern with wide BOS, possibly due to groin wounds. Pt requests to return to bed due to fatigue. Sit to supine Supervision. Pt left seated in bed with needs in reach, bed alarm in place. Pt missed 10 min of scheduled therapy session due to  fatigue.  Session 2: Pt received seated in bed, agreeable to PT session. No complaints of pain. Bed mobility Supervision. Sit to stand with min A to RW during session. Patient demonstrates increased fall risk as noted by score of 10/56 on Berg Balance Scale.  (<36= high risk for falls, close to 100%; 37-45 significant >80%; 46-51 moderate >50%; 52-55 lower >25%). Reviewed score and functional implications. Pt exhibits difficulty removing UE from RW for support during standing tasks due to fear of falling. Pt also exhibits B hip circumduction with 6" step-taps. Ambulation x 150 ft with RW and CGA for balance, flexed trunk posture, shuffling gait pattern and decreased RLE clearance as compared to LLE. Pt reports onset of dizziness during gait but does not report until end of ambulation. Seated BP 120/95. Pt requests to return to bed due to dizziness. Sit to supine Supervision. Pt left seated in bed with needs in reach, bed alarm in place.  Therapy Documentation Precautions:  Precautions Precautions: Fall Restrictions Weight Bearing Restrictions: No General: PT Amount of Missed Time (min): 10 Minutes PT Missed Treatment Reason: Patient fatigue   Standardized Balance Assessment: Berg Balance Test Berg Balance Test Sit to Stand: Needs minimal aid to stand or to stabilize Standing Unsupported: Unable to stand 30 seconds unassisted Sitting with Back Unsupported but Feet Supported on Floor or Stool: Able to sit safely and securely 2 minutes Stand to Sit: Sits independently, has uncontrolled descent Transfers: Needs one person to assist  Standing Unsupported with Eyes Closed: Needs help to keep from falling Standing Ubsupported with Feet Together: Needs help to attain position but able to stand for 30 seconds with feet together From Standing, Reach Forward with Outstretched Arm: Loses balance while trying/requires external support From Standing Position, Pick up Object from Floor: Unable to try/needs  assist to keep balance From Standing Position, Turn to Look Behind Over each Shoulder: Needs supervision when turning Turn 360 Degrees: Needs assistance while turning Standing Unsupported, Alternately Place Feet on Step/Stool: Needs assistance to keep from falling or unable to try Standing Unsupported, One Foot in Front: Needs help to step but can hold 15 seconds Standing on One Leg: Unable to try or needs assist to prevent fall Total Score: 10     Therapy/Group: Individual Therapy   Peter Congo, PT, DPT, CSRS 05/19/2022, 9:57 AM

## 2022-05-19 NOTE — Progress Notes (Signed)
Inpatient Rehabilitation  Patient information reviewed and entered into eRehab system by Guy Toney M. Keylani Perlstein, M.A., CCC/SLP, PPS Coordinator.  Information including medical coding, functional ability and quality indicators will be reviewed and updated through discharge.    

## 2022-05-19 NOTE — Progress Notes (Signed)
PROGRESS NOTE   Subjective/Complaints:  Pt reports that was dizzy/lightheaded this AM- and was sitting up eating breakfast-  N/V while eating -  LBM yesterday- no constipation.   Cr 1.30- was 1.17-    ROS:  Pt denies SOB, abd pain, CP, N/V/C/D, and vision changes   Objective:   No results found. Recent Labs    05/17/22 0503 05/19/22 0618  WBC 5.8 5.4  HGB 11.3* 10.9*  HCT 34.6* 33.0*  PLT 359 387   Recent Labs    05/17/22 0503 05/19/22 0618  NA 134* 135  K 4.2 4.3  CL 103 103  CO2 23 27  GLUCOSE 125* 96  BUN 16 13  CREATININE 1.17 1.30*  CALCIUM 8.9 8.5*    Intake/Output Summary (Last 24 hours) at 05/19/2022 0834 Last data filed at 05/19/2022 0529 Gross per 24 hour  Intake 1200 ml  Output 2050 ml  Net -850 ml        Physical Exam: Vital Signs Blood pressure (!) 146/93, pulse (!) 57, temperature 97.8 F (36.6 C), resp. rate 15, height 6\' 2"  (1.88 m), weight 113 kg, SpO2 100 %.   General: awake, alert, appropriate, sitting up in bed; looks a little dizzy;  NAD HENT: conjugate gaze; oropharynx a little dry CV: regular rate; no JVD Pulmonary: CTA B/L; no W/R/R- good air movement GI: soft,, ND, (+)BS- mild TTP- normoactive BS Psychiatric: appropriate Neurological: alert Psych: Pt's affect is appropriate. Pt is cooperative Skin: Clean in clean and intact with exception of Left and Right groin.  Presence of Hidradenitis Suppurativa adjacent to scrotum and on left thigh and right thigh  Side just next to scrotum on both sided has a 4-5 cm are that is indurated, red, with sanguinous drainage.  This area has a pungent smell, but improved from yesterday. Area on upper left thigh is not draining but has skin pattern of dimpling with HS.  On bilateral buttocks has three areas covered with foam pads with dimpling pattern of HS minimal sanguinous drainage. Neuro: Cranial nerves II through XII intact.  Speech  content is fluent but some mild hesitancy.  No aphasia present patient.  Patient able to recall 1 of 3 words with 3-minute wait between can follow commands and answer questions appropriately.  Sensation is intact to light touch for both bilateral and upper lower and lower extremities.  Strength 5 out of 5 in left upper and left lower extremity.  Strength 4+ out of 5 throughout the right upper extremity.  Strength 4 out of 5 proximal right lower extremity..  4+ out of 5 distal right lower extremity.  Normal coordination present Musculoskeletal: No range of motion deficits noted.  Muscle bulk and tone is normal.  No swelling or joint tenderness noticed.  PIV in right upper extremity  Assessment/Plan: 1. Functional deficits which require 3+ hours per day of interdisciplinary therapy in a comprehensive inpatient rehab setting. Physiatrist is providing close team supervision and 24 hour management of active medical problems listed below. Physiatrist and rehab team continue to assess barriers to discharge/monitor patient progress toward functional and medical goals  Care Tool:  Bathing  Bathing activity did not occur: Refused (already  bathed with PT per report)           Bathing assist       Upper Body Dressing/Undressing Upper body dressing Upper body dressing/undressing activity did not occur (including orthotics): Refused (already donned shirt with PT)      Upper body assist      Lower Body Dressing/Undressing Lower body dressing      What is the patient wearing?: Pants     Lower body assist Assist for lower body dressing: Minimal Assistance - Patient > 75%     Toileting Toileting    Toileting assist Assist for toileting: Minimal Assistance - Patient > 75%     Transfers Chair/bed transfer  Transfers assist     Chair/bed transfer assist level: Minimal Assistance - Patient > 75%     Locomotion Ambulation   Ambulation assist      Assist level: Minimal Assistance -  Patient > 75% Assistive device: Walker-rolling Max distance: 20   Walk 10 feet activity   Assist     Assist level: Minimal Assistance - Patient > 75% Assistive device: Walker-rolling   Walk 50 feet activity   Assist Walk 50 feet with 2 turns activity did not occur: Refused         Walk 150 feet activity   Assist Walk 150 feet activity did not occur: Refused         Walk 10 feet on uneven surface  activity   Assist Walk 10 feet on uneven surfaces activity did not occur: Refused         Wheelchair     Assist Is the patient using a wheelchair?: Yes Type of Wheelchair: Manual Wheelchair activity did not occur: Refused         Wheelchair 50 feet with 2 turns activity    Assist    Wheelchair 50 feet with 2 turns activity did not occur: Refused       Wheelchair 150 feet activity     Assist  Wheelchair 150 feet activity did not occur: Refused       Blood pressure (!) 146/93, pulse (!) 57, temperature 97.8 F (36.6 C), resp. rate 15, height  (1.88 m), weight 113 kg, SpO2 100 %.    Medical Problem List and Plan: 1. 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             -patient may shower             -ELOS/Goals: 5-7 days, Mod I with PT/OT/SLP  Con't CIR- PT and OT as well as SLP 2.  Antithrombotics: -DVT/anticoagulation:  Mechanical: Sequential compression devices, below knee Bilateral lower extremities             -antiplatelet therapy: Plavix and aspirin for 3 weeks followed by Plavix alone 6/10 continue dual antiplatelet therapy 3. Pain Management: Tramadol, Tylenol as needed 6/10 pain is well controlled today with current regimen 4. Mood: LCSW to evaluate and provide emotional support             -antipsychotic agents: n/a 5. Neuropsych: This patient is capable of making decisions on his own behalf. 6. Skin/Wound Care:  Routine skin care checks             -- Hidradenitis R groin: Local wound care; follow-up PCP             -On Vanc and zosyn for 48  hours, consider de-escalation to augemtin 6/10 Left groin hidradenitis, continue antibiotics vancomycin and Zosyn per pharmacy consider de-escalation to Augmentin we will further discuss with Dr. Riley Kill MD tomorrow in addition to discussing with pharmacy as well.  Wound visualized today does have foul smell to it is tender to palpation. 6/11 Appears that HS is on both right and left groin area with extension to bilateral thighs and buttocks.  Area directly adjacent to scrotum is raw with bloody drainage, smell less foul today than yesterday.  But due to opens wounds continue IV antibiotics today and overnight.  Primary team to decide to convert over to po antibiotics.  6/12- needs general surgery to look at him- to see if needs I&D- will decide about IV ABX once decided if needs I&D.  7. Fluids/Electrolytes/Nutrition: Routine ins and outs and follow-up chemistries 8: Hypertension: Norvasc 10 mg daily initiated post Cleviprex, Toprol-xl 25mg  -Has had a few mildly elevated BPs, continue to monitor trend    05/19/2022    7:56 AM 05/19/2022    4:08 AM 05/18/2022    8:32 PM  Vitals with BMI  Systolic 146 129 07/18/2022  Diastolic 93 81 78  Pulse 57 66 64  6/10 still with some elevated blood pressures, continue to trend with Toprol XL 25 mg. 6/12- BP very slightlyelevated- con't to monitor trend 9: Hyperlipidemia: Continue atorvastatin 80 mg daily 10: Tobacco use: Cessation counseling -Nicotine patch 11: Alcohol abuse: Cessation counseling -Thiamine supplementation, multivitamin, folic acid 6/11 No withdrawal symptoms today, cessation counseling 12: THC use: Advised regarding stroke risk 13: Obesity: Dietary recommendations    I spent a total of  39  minutes on total care today- >50% coordination of care- due to d/w PA about Gen surgery and IV ABX.    LOS: 3 days A  FACE TO FACE EVALUATION WAS PERFORMED  Foster Sonnier 05/19/2022, 8:34 AM

## 2022-05-19 NOTE — Progress Notes (Signed)
Requesting MD: Dr. Genice Rouge    Subjective: This is a 56 yo male who is in CIR for an acute CVA.  He has been found to have hidradenitis.  He has had worsening of his symptoms over the last year.  He recently went to [facility] ED for evaluation and was given an abx at discharge but no I&D needed per patient.  Unfortunately he had a CVA and was admitted here and never finished the abx course.  His symptoms are currently stable, but we have been asked to see to assure no surgical needs.  ROS: See above, otherwise other systems negative  Objective: Vital signs in last 24 hours: Temp:  [97.4 F (36.3 C)-98.1 F (36.7 C)] 97.7 F (36.5 C) (06/12 1346) Pulse Rate:  [57-66] 63 (06/12 1346) Resp:  [15-18] 18 (06/12 1346) BP: (129-148)/(78-95) 135/79 (06/12 1346) SpO2:  [100 %] 100 % (06/12 1346) Last BM Date : 05/19/22  Intake/Output from previous day: 06/11 0701 - 06/12 0700 In: 1760 [P.O.:1760] Out: 2050 [Urine:2050] Intake/Output this shift: Total I/O In: -  Out: 200 [Urine:200]  PE: Skin: chronic skin changes c/w hidradenitis L medial thigh > R medial thigh and perineum.  He has some serosanguineous and bloody drainage noted.  He has multiple chronic fistulous openings noted.  No areas of fluctuance or cellulitis.  Induration present as baseline.  Lab Results:  Recent Labs    05/17/22 0503 05/19/22 0618  WBC 5.8 5.4  HGB 11.3* 10.9*  HCT 34.6* 33.0*  PLT 359 387   BMET Recent Labs    05/17/22 0503 05/19/22 0618  NA 134* 135  K 4.2 4.3  CL 103 103  CO2 23 27  GLUCOSE 125* 96  BUN 16 13  CREATININE 1.17 1.30*  CALCIUM 8.9 8.5*   PT/INR No results for input(s): "LABPROT", "INR" in the last 72 hours. CMP     Component Value Date/Time   NA 135 05/19/2022 0618   K 4.3 05/19/2022 0618   CL 103 05/19/2022 0618   CO2 27 05/19/2022 0618   GLUCOSE 96 05/19/2022 0618   BUN 13 05/19/2022 0618   CREATININE 1.30 (H) 05/19/2022 0618   CALCIUM 8.5 (L) 05/19/2022 0618    PROT 7.1 05/17/2022 0503   ALBUMIN 2.8 (L) 05/17/2022 0503   AST 10 (L) 05/17/2022 0503   ALT 9 05/17/2022 0503   ALKPHOS 63 05/17/2022 0503   BILITOT 0.5 05/17/2022 0503   GFRNONAA >60 05/19/2022 0618   GFRAA >60 06/04/2020 1522   Lipase  No results found for: "LIPASE"     Studies/Results: No results found.  Anti-infectives: Anti-infectives (From admission, onward)    Start     Dose/Rate Route Frequency Ordered Stop   05/17/22 1700  vancomycin (VANCOREADY) IVPB 1250 mg/250 mL        1,250 mg 166.7 mL/hr over 90 Minutes Intravenous Every 12 hours 05/17/22 1149     05/17/22 0500  vancomycin (VANCOCIN) IVPB 1000 mg/200 mL premix  Status:  Discontinued        1,000 mg 200 mL/hr over 60 Minutes Intravenous Every 12 hours 05/16/22 1539 05/17/22 1149   05/16/22 2200  piperacillin-tazobactam (ZOSYN) IVPB 3.375 g        3.375 g 12.5 mL/hr over 240 Minutes Intravenous Every 8 hours 05/16/22 1539     05/16/22 1630  vancomycin (VANCOREADY) IVPB 2000 mg/400 mL        2,000 mg 200 mL/hr over 120 Minutes Intravenous  Once 05/16/22 1539  05/16/22 1920        Assessment/Plan Hidradenitis suppurativa The patient has been seen and examined.  He does not need any acute surgery or intervention.  He needs suppressive abx therapy with doxy for at least 2-4 weeks.  He also would benefit from clindamycin solution topically.  He does not have insurance so unclear if he can get this through [facility] or our pharmacy prior to discharge if we have it on formulary.  Relayed this to primary service.  No IV abx are warranted for this.  He may follow up with one of our surgeons vs dermatology as an outpatient.   Acute CVA Asthma COPD HTN  I reviewed hospitalist notes, last 24 h vitals and pain scores, last 48 h intake and output, last 24 h labs and trends, and last 24 h imaging results.   LOS: 3 days    Letha Cape , [facility] 05/19/2022, 2:03 PM Please see Amion for  pager number during day hours 7:00am-4:30pm or 7:00am -11:30am on weekends

## 2022-05-19 NOTE — Progress Notes (Signed)
Consulted general surgery/PA on call regarding management of bilateral groin hidradenitis.

## 2022-05-20 MED ORDER — ONDANSETRON HCL 4 MG PO TABS
4.0000 mg | ORAL_TABLET | Freq: Three times a day (TID) | ORAL | Status: DC | PRN
  Administered 2022-05-20: 4 mg via ORAL
  Filled 2022-05-20: qty 1

## 2022-05-20 NOTE — Progress Notes (Signed)
Speech Language Pathology Daily Session Note  Patient Details  Name: James Lambert MRN: [id number] Date of Birth: Jun 29, 1966  Today's Date: 05/20/2022 SLP Individual Time: 3244-0102 SLP Individual Time Calculation (min): 44 min  Short Term Goals: Week 1: SLP Short Term Goal 1 (Week 1): Patient will verbally express wants/needs at the sentence level with Mod I. SLP Short Term Goal 2 (Week 1): Patient will recall new, daily information with Min verbal and visual cues. SLP Short Term Goal 3 (Week 1): Patient will demonstrate functional problem soving for mildly complex tasks with Min verbal cues. SLP Short Term Goal 4 (Week 1): Patient will self-monitor and correct errors during functional tasks with Min verbal cues.  Skilled Therapeutic Interventions: Pt seen for skilled ST with focus on cognitive communication goals, pt in bed and agreeable to therapeutic tasks. Pt reporting need to void, able to adequately verbalize needs for urinal and bed positioning to assist with sit to stand, however required mod verbal cues for safety as pt repeatedly requesting to stand alone and for SLP to turn off bed alarm for him. SLP providing education regarding safety precautions in CIR including assist with all mobility at this time. Pt with continent urine void and requesting to sit EOB for remainder of session, denies any dizziness. SLP facilitating functional problem solving focused on current medication regime by providing min-mod A verbal cues. Pt does demonstrate carryover of new information within treatment session with Supervision A cues. Pt left lying in bed with bed alarm set and all needs within reach. Cont ST POC.   Pain Pain Assessment Pain Scale: 0-10 Pain Score: 0-No pain Faces Pain Scale: No hurt  Therapy/Group: Individual Therapy  Tacey Ruiz 05/20/2022, 10:11 AM

## 2022-05-20 NOTE — Progress Notes (Signed)
PROGRESS NOTE   Subjective/Complaints:  Pt reports no vomiting, but was nauseated this Am- got anti nausea meds- feeling somewhat better, but still lightheaded and dizzy esp with sitting up.  Stopped eating when got nauseated.  Denies constipation.   Better after anti-nausea med kicked in- Asking nursing to check  orthostatics.   ROS:   Pt denies SOB, abd pain, CP, (+) N/V/C/D, and vision changes   Objective:   No results found. Recent Labs    05/19/22 0618  WBC 5.4  HGB 10.9*  HCT 33.0*  PLT 387   Recent Labs    05/19/22 0618  NA 135  K 4.3  CL 103  CO2 27  GLUCOSE 96  BUN 13  CREATININE 1.30*  CALCIUM 8.5*    Intake/Output Summary (Last 24 hours) at 05/20/2022 0852 Last data filed at 05/20/2022 0718 Gross per 24 hour  Intake 358 ml  Output 2750 ml  Net -2392 ml        Physical Exam: Vital Signs Blood pressure (!) 142/92, pulse (!) 57, temperature 97.9 F (36.6 C), resp. rate 16, height 6\' 2"  (1.88 m), weight 113 kg, SpO2 100 %.    General: awake, alert, appropriate, sitting up slightly in bed; NAD HENT: conjugate gaze; oropharynx moist CV: regular rate; no JVD Pulmonary: CTA B/L; no W/R/R- good air movement GI: somewhat firm; NT; ND; hypoactive BS Psychiatric: appropriate but VERY flat affect/flat facies Neurological: Ox3- but slow to respond Psych: Pt's affect is appropriate. Pt is cooperative Skin: Clean in clean and intact with exception of Left and Right groin.  Presence of Hidradenitis Suppurativa adjacent to scrotum and on left thigh and right thigh  Side just next to scrotum on both sided has a 4-5 cm are that is indurated, red, with sanguinous drainage.  This area has a pungent smell, but improved from yesterday. Area on upper left thigh is not draining but has skin pattern of dimpling with HS.  On bilateral buttocks has three areas covered with foam pads with dimpling pattern of HS  minimal sanguinous drainage. Neuro: Cranial nerves II through XII intact.  Speech content is fluent but some mild hesitancy.  No aphasia present patient.  Patient able to recall 1 of 3 words with 3-minute wait between can follow commands and answer questions appropriately.  Sensation is intact to light touch for both bilateral and upper lower and lower extremities.  Strength 5 out of 5 in left upper and left lower extremity.  Strength 4+ out of 5 throughout the right upper extremity.  Strength 4 out of 5 proximal right lower extremity..  4+ out of 5 distal right lower extremity.  Normal coordination present Musculoskeletal: No range of motion deficits noted.  Muscle bulk and tone is normal.  No swelling or joint tenderness noticed.  PIV in right upper extremity  Assessment/Plan: 1. Functional deficits which require 3+ hours per day of interdisciplinary therapy in a comprehensive inpatient rehab setting. Physiatrist is providing close team supervision and 24 hour management of active medical problems listed below. Physiatrist and rehab team continue to assess barriers to discharge/monitor patient progress toward functional and medical goals  Care Tool:  Bathing  Bathing activity did not occur: Refused (already bathed with PT per report)           Bathing assist       Upper Body Dressing/Undressing Upper body dressing Upper body dressing/undressing activity did not occur (including orthotics): Refused (already donned shirt with PT)      Upper body assist      Lower Body Dressing/Undressing Lower body dressing      What is the patient wearing?: Pants     Lower body assist Assist for lower body dressing: Minimal Assistance - Patient > 75%     Toileting Toileting    Toileting assist Assist for toileting: Minimal Assistance - Patient > 75%     Transfers Chair/bed transfer  Transfers assist     Chair/bed transfer assist level: Contact Guard/Touching assist      Locomotion Ambulation   Ambulation assist      Assist level: Contact Guard/Touching assist Assistive device: Walker-rolling Max distance: 150'   Walk 10 feet activity   Assist     Assist level: Contact Guard/Touching assist Assistive device: Walker-rolling   Walk 50 feet activity   Assist Walk 50 feet with 2 turns activity did not occur: Refused  Assist level: Contact Guard/Touching assist Assistive device: Walker-rolling    Walk 150 feet activity   Assist Walk 150 feet activity did not occur: Refused  Assist level: Contact Guard/Touching assist Assistive device: Walker-rolling    Walk 10 feet on uneven surface  activity   Assist Walk 10 feet on uneven surfaces activity did not occur: Refused         Wheelchair     Assist Is the patient using a wheelchair?: Yes Type of Wheelchair: Manual Wheelchair activity did not occur: Refused         Wheelchair 50 feet with 2 turns activity    Assist    Wheelchair 50 feet with 2 turns activity did not occur: Refused       Wheelchair 150 feet activity     Assist  Wheelchair 150 feet activity did not occur: Refused       Blood pressure (!) 142/92, pulse (!) 57, temperature 97.9 F (36.6 C), resp. rate 16, height 6\' 2"  (1.88 m), weight 113 kg, SpO2 100 %.    Medical Problem List and Plan: 1. 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             -patient may shower             -ELOS/Goals: 5-7 days, Mod I with PT/OT/SLP  Continue CIR- PT, OT and SLP  Team conference today to determine length of stay 2.  Antithrombotics: -DVT/anticoagulation:  Mechanical: Sequential compression devices, below knee Bilateral lower extremities             -antiplatelet therapy: Plavix and aspirin for 3 weeks followed by Plavix alone 6/10 continue dual antiplatelet therapy 3. Pain Management: Tramadol,  Tylenol as needed 6/10 pain is well controlled today with current regimen 4. Mood: LCSW to evaluate and provide emotional support             -antipsychotic agents: n/a 5. Neuropsych: This patient is capable of making decisions on his own behalf. 6. Skin/Wound Care: Routine skin care checks             -- Hidradenitis R groin: Local wound care; follow-up PCP             -  On Vanc and zosyn for 48 hours, consider de-escalation to augemtin 6/10 Left groin hidradenitis, continue antibiotics vancomycin and Zosyn per pharmacy consider de-escalation to Augmentin we will further discuss with Dr. Naaman Plummer MD tomorrow in addition to discussing with pharmacy as well.  Wound visualized today does have foul smell to it is tender to palpation. 6/11 Appears that HS is on both right and left groin area with extension to bilateral thighs and buttocks.  Area directly adjacent to scrotum is raw with bloody drainage, smell less foul today than yesterday.  But due to opens wounds continue IV antibiotics today and overnight.  Primary team to decide to convert over to po antibiotics.  6/12- needs general surgery to look at him- to see if needs I&D- will decide about IV ABX once decided if needs I&D.   6/13- changed to Vibramycin and clindamycin topical gel- stopped IV ABX- should hopefully help nausea 7. Fluids/Electrolytes/Nutrition: Routine ins and outs and follow-up chemistries 8: Hypertension: Norvasc 10 mg daily initiated post Cleviprex, Toprol-xl 25mg  -Has had a few mildly elevated BPs, continue to monitor trend    05/20/2022    7:31 AM 05/20/2022    2:51 AM 05/19/2022    8:26 PM  Vitals with BMI  Systolic [id number] [id number]  Diastolic 92 95 [id number]  Pulse 57 60 59  6/10 still with some elevated blood pressures, continue to trend with Toprol XL 25 mg. 6/12- BP very slightlyelevated- con't to monitor trend 6/13- BP running 141-160s/90s- if doesn't improve in next 24-48 hours, can increase BP meds. Is c/o light headedness  and dizziness- however not clear if orthostatic hypotension- will check orthostatics.   9: Hyperlipidemia: Continue atorvastatin 80 mg daily 10: Tobacco use: Cessation counseling -Nicotine patch 11: Alcohol abuse: Cessation counseling -Thiamine supplementation, multivitamin, folic acid [id number] No withdrawal symptoms today, cessation counseling 12: THC use: Advised regarding stroke risk 13: Obesity: Dietary recommendations 14. Nausea/vomiting  6/13- will start Zofran prn in addition to compazine for nausea- to help refractory N/V- thinking could be form IV ABX, but they stopped yesterday. Pt says LBM yesterday, but if nasuea doesn't improve, suggest checking KUB    I spent a total of  41  minutes on total care today- >50% coordination of care- due to d/w PA, nursing as well as team conference to determine length of stay   LOS: 4 days A FACE TO FACE EVALUATION WAS PERFORMED  James Lambert 05/20/2022, 8:52 AM

## 2022-05-20 NOTE — Progress Notes (Signed)
Patient ID: James Lambert, male   DOB: 1966/07/28, 56 y.o.   MRN: [id number]  SW met with pt in room to provide updates from team conference, and d/c date 6/20. SW informed will call his  brother.   67- SW spoke with pt brother to inform on above. SW shared waiting to hear on when the Medicaid application will be completed here. SW discussed family edu. Scheduled for Thursday 1:30pm-4pm.   Loralee Pacas, MSW, [place] Office: [phone] Cell: [phone] Fax: [phone]

## 2022-05-20 NOTE — Progress Notes (Signed)
Physical Therapy Session Note  Patient Details  Name: James Lambert MRN: [id number] Date of Birth: 03/31/1966  Today's Date: 05/20/2022 PT Individual Time: 1415-1445 PT Individual Time Calculation (min): 30 min   Short Term Goals: Week 1:  PT Short Term Goal 1 (Week 1): Pt will ambulate with RW 274ft with CGA PT Short Term Goal 2 (Week 1): Pt will perform Berg balance scale to assess safety and function PT Short Term Goal 3 (Week 1): Pt will propell WC 163ft with supervision assist PT Short Term Goal 4 (Week 1): Pt will transfer to and from [facility] with supervision assist consistnetly  Skilled Therapeutic Interventions/Progress Updates:    Pt received supine in bed, agreeable to PT session. Pt reports pain in groin region at site of wounds, not rated and declines intervention. Supine BP 134/85. Supine to sit with Supervision. Seated BP 130/99. Sit to stand with CGA to RW. Standing BP 130/95. Ambulation 2 x 100 ft with RW and CGA for balance, focus on B heel strike during gait as pt tends to scoot his feet along the floor. Standing alt L/R 4" step-taps with RW and CGA for balance for LE strengthening and coordination. Standing balance while toileting with RW and CGA. Pt returns to bed at end of session, Supervision for bed mobility. Pt left seated in bed with needs in reach, bed alarm in place at end of session.  Therapy Documentation Precautions:  Precautions Precautions: Fall Restrictions Weight Bearing Restrictions: No       Therapy/Group: Individual Therapy   Peter Congo, PT, DPT, CSRS 05/20/2022, 3:37 PM

## 2022-05-20 NOTE — Progress Notes (Signed)
Physical Therapy Session Note  Patient Details  Name: James Lambert MRN: [id number] Date of Birth: Jun 19, 1966  Today's Date: 05/20/2022 PT Individual Time: 0932-3557 PT Individual Time Calculation (min): 40 min   Short Term Goals: Week 1:  PT Short Term Goal 1 (Week 1): Pt will ambulate with RW 215ft with CGA PT Short Term Goal 2 (Week 1): Pt will perform Berg balance scale to assess safety and function PT Short Term Goal 3 (Week 1): Pt will propell WC 167ft with supervision assist PT Short Term Goal 4 (Week 1): Pt will transfer to and from [facility] with supervision assist consistnetly  Skilled Therapeutic Interventions/Progress Updates:     Pt received supine in bed with lights off. Initially pt wanting to defer therapy secondary to dizziness but with gentle encouragement is agreeable to participate. Reports pain in both knee and legs. Number not provided. PT provides rest breaks as needed to manage pain. Pt performs supine to sit with elevated HOB and use of bedrails.Sit to stand with RW from elevated bed and CGA, with cues for initiation and hand placement. Pt ambulates to dayroom, x80', with RW and CGA. PT provides cues to increase proximity to RW for safety, as well as maintain hip and trunk extension to improve posture and gait mechanics. Pt completes Nustep for strength and endurance training as well as reciprocal coordination. Pt completes x12:00 at workload of 6 with average steps per minute ~55.   Pt ambulates x125' back to room with RW and CGA, with cues for upright posture to improve balance and gait mechanics. Pt stands at toilet to urinate without physical assistance for balance, then stands at sink to wash hands. PT cues pt for positioning for transfer back to bed. Left supine with alarm intact and all needs within reach.  Therapy Documentation Precautions:  Precautions Precautions: Fall Restrictions Weight Bearing Restrictions: No   Therapy/Group: Individual Therapy  Beau Fanny, PT, DPT 05/20/2022, 6:04 PM

## 2022-05-20 NOTE — Progress Notes (Signed)
Occupational Therapy Session Note  Patient Details  Name: James Lambert MRN: [id number] Date of Birth: Feb 23, 1966  Today's Date: 05/20/2022 OT Individual Time: 0932-6712 OT Individual Time Calculation (min): 33 min  and Today's Date: 05/20/2022 OT Missed Time: 42 Minutes Missed Time Reason: Other (comment) (elevated BP)   Short Term Goals: Week 1:  OT Short Term Goal 1 (Week 1): Pt will complete 2/3 toileting steps with CGA OT Short Term Goal 2 (Week 1): Pt will donn LB clothing with CGA for balance using AE PRN OT Short Term Goal 3 (Week 1): Pt will tolerate stance position >5 mins in prep for ADL tasks  Skilled Therapeutic Interventions/Progress Updates:    Pt resting in bed upon arrival and agreeable to getting OOB for therapy. Pt declined bathing this morning. Upon sitting EOB, pt reported dizziness and blurred vision. BP 151/111 and 145/109. LPN Ebony notified and reassessed-144/97 with continued dizziness and blurred vision. Pt requested to use urinal sitting EOB. Upon completion, pt did not attend to RUE and had to be instructed to release urinal from RUE. Pt commented, "see what I mean. My right hand just doesn't work good sometime." Pt returned to supine with LPN attending. Pt missed 42 mins skilled OT services. Will attempt to see again as schedule allows.   Therapy Documentation Precautions:  Precautions Precautions: Fall Restrictions Weight Bearing Restrictions: No General: General OT Amount of Missed Time: 42 Minutes Vital Signs: Therapy Vitals Pulse Rate: (!) 57 Resp: 16 BP: (!) 142/92 Patient Position (if appropriate): Orthostatic Vitals Oxygen Therapy SpO2: 100 % O2 Device: Room Air Pain: Pt grimaced when repositioning RLE; relief reported with supported by pillow   Therapy/Group: Individual Therapy  [name] 05/20/2022, 8:43 AM

## 2022-05-20 NOTE — Patient Care Conference (Addendum)
Inpatient RehabilitationTeam Conference and Plan of Care Update Date: 05/21/2022   Time: 11:06 AM    Patient Name: James Lambert      Medical Record Number: [id number]  Date of Birth: 01/31/1966 Sex: Male         Room/Bed: 4W12C/4W12C-02 Payor Info: Payor: MEDICAID PENDING / Plan: MEDICAID PENDING / Product Type: *No Product type* /    Admit Date/Time:  05/16/2022  3:37 PM  Primary Diagnosis:  Ischemic stroke of frontal lobe [facility])  Hospital Problems: Principal Problem:   Ischemic stroke of frontal lobe [facility])    Expected Discharge Date: Expected Discharge Date: 05/27/22  Team Members Present: Physician leading conference: Dr. Genice Rouge Social Worker Present: Cecile Sheerer, LCSWA Nurse Present: Kennyth Arnold, RN PT Present: Peter Congo, PT OT Present: Ardis Rowan, COTA;Jennifer Katrinka Blazing, OT SLP Present: Gerda Diss, SLP PPS Coordinator present : Fae Pippin, SLP     Current Status/Progress Goal Weekly Team Focus  Bowel/Bladder   continent to bowel and bladder LBM 6/12  remain continent  toilet as needed   Swallow/Nutrition/ Hydration             ADL's   bathing-min/mod A; dressing-min A; functional transfers-min A; occasional motor apraxia  supervision overall  BADL training, activity tolerance, safety awareness, education   Mobility   Supervision bed mobility, min A transfers, CGA gait up to 150 ft with RW (dizzy)  Supervision overall  standing balance, endurance, gait, stair initiation   Communication   Supervision for expression -delayed responses at times (?motor)  Mod I  word finding, response timing   Safety/Cognition/ Behavioral Observations  Min A  Supervision A  memory, problem solving, awareness and self-monitoring   Pain   Denies pain, schedule tramadol  pain to remain controlled on current regimen  assess pain q 4hr and prn   Skin   Boils to inter thigh and grion area, serosanguineos drainage  wound to show signs of healing  assess skin q shift  and prn     Discharge Planning:  D/c to home with his brother who has an aide who can only provide supervision. Pt in uninsured at this time.   Team Discussion: Reports nausea and lightheadedness. Order for orthostatic vital signs. Abx may contribute to nausea. Possibly constipated. Continent B/B, no reported pain. Hidradenitis to groin and thighs. Uninsured, lives with brothers.    Patient on target to meet rehab goals: yes, supervision goals. Currently CGA overall with RW. Occasional motor apraxia and right intention. Min assist cognition. Working on problem solving and attention.  *See Care Plan and progress notes for long and short-term goals.   Revisions to Treatment Plan:  Monitoring blood pressure and adjusting medications.   Teaching Needs: Family education, medication management, skin/wound care, transfer/gait training, etc.   Current Barriers to Discharge: Decreased caregiver support, Wound care, and Lack of/limited family support  Possible Resolutions to Barriers: Family education Follow-up PT/OT/SLP Order recommended DME     Medical Summary Current Status: hidrenadeniis- in groin thighs and buttocks; dizzy/lightheaded but BP running 140s-150s systolic- continent- no pressure ulcers  Barriers to Discharge: Weight;Decreased family/caregiver support;Insurance for SNF coverage;Home enviroment access/layout;Medical stability;Wound care  Barriers to Discharge Comments: is uninsured- possibly at least  1 year of disability Possible Resolutions to [employer] Focus: midl R inattention and motor apraxia seen- couldn't get glove on- SLP for cognition- min A-repeats self- cannot answer many questions- SLUMs 21/30- d/c 6/20   Continued Need for Acute Rehabilitation Level of Care: The patient requires  daily medical management by a physician with specialized training in physical medicine and rehabilitation for the following reasons: Direction of a multidisciplinary physical  rehabilitation program to maximize functional independence : Yes Medical management of patient stability for increased activity during participation in an intensive rehabilitation regime.: Yes Analysis of laboratory values and/or radiology reports with any subsequent need for medication adjustment and/or medical intervention. : Yes   I attest that I was present, lead the team conference, and concur with the assessment and plan of the team.   Tennis Must 05/21/2022, 9:48 AM

## 2022-05-20 NOTE — Patient Care Conference (Incomplete Revision)
Inpatient RehabilitationTeam Conference and Plan of Care Update Date: 05/20/2022   Time: 11:06 AM    Patient Name: James Lambert      Medical Record Number: [id number]  Date of Birth: 1966-08-01 Sex: Male         Room/Bed: 4W12C/4W12C-02 Payor Info: Payor: /    Admit Date/Time:  05/16/2022  3:37 PM  Primary Diagnosis:  Ischemic stroke of frontal lobe [facility])  Hospital Problems: Principal Problem:   Ischemic stroke of frontal lobe [facility])    Expected Discharge Date: Expected Discharge Date: 05/27/22  Team Members Present: Physician leading conference: Dr. Genice Rouge Social Worker Present: Cecile Sheerer, LCSWA Nurse Present: Kennyth Arnold, RN PT Present: Peter Congo, PT OT Present: Ardis Rowan, COTA;Jennifer Katrinka Blazing, OT SLP Present: Gerda Diss, SLP PPS Coordinator present : Fae Pippin, SLP     Current Status/Progress Goal Weekly Team Focus  Bowel/Bladder   continent to bowel and bladder LBM 6/12  remain continent  toilet as needed   Swallow/Nutrition/ Hydration             ADL's   bathing-min/mod A; dressing-min A; functional transfers-min A; occasional motor apraxia  supervision overall  BADL training, activity tolerance, safety awareness, education   Mobility   Supervision bed mobility, min A transfers, CGA gait up to 150 ft with RW (dizzy)  Supervision overall  standing balance, endurance, gait, stair initiation   Communication   Supervision for expression -delayed responses at times (?motor)  Mod I  word finding, response timing   Safety/Cognition/ Behavioral Observations  Min A  Supervision A  memory, problem solving, awareness and self-monitoring   Pain   Denies pain, schedule tramadol  pain to remain controlled on current regimen  assess pain q 4hr and prn   Skin   Boils to inter thigh and grion area, serosanguineos drainage  wound to show signs of healing  assess skin q shift and prn     Discharge Planning:      Team Discussion: Reports nausea  and lightheadedness. Order for orthostatic vital signs. Abx may contribute to nausea. Possibly constipated. Continent B/B, no reported pain. Hidradenitis to groin and thighs. Uninsured, lives with brothers.    Patient on target to meet rehab goals: yes, supervision goals. Currently CGA overall with RW. Occasional motor apraxia and right intention. Min assist cognition. Working on problem solving and attention.  *See Care Plan and progress notes for long and short-term goals.   Revisions to Treatment Plan:  Monitoring blood pressure and adjusting medications.   Teaching Needs: Family education, medication management, skin/wound care, transfer/gait training, etc.   Current Barriers to Discharge: Decreased caregiver support, Wound care, and Lack of/limited family support  Possible Resolutions to Barriers: Family education Follow-up PT/OT/SLP Order recommended DME     Medical Summary Current Status: hidrenadeniis- in groin thighs and buttocks; dizzy/lightheaded but BP running 140s-150s systolic- continent- no pressure ulcers  Barriers to Discharge: Weight;Decreased family/caregiver support;Insurance for SNF coverage;Home enviroment access/layout;Medical stability;Wound care  Barriers to Discharge Comments: is uninsured- possibly at least  1 year of disability Possible Resolutions to [employer] Focus: midl R inattention and motor apraxia seen- couldn't get glove on- SLP for cognition- min A-repeats self- cannot answer many questions- SLUMs 21/30- d/c 6/20   Continued Need for Acute Rehabilitation Level of Care: The patient requires daily medical management by a physician with specialized training in physical medicine and rehabilitation for the following reasons: Direction of a multidisciplinary physical rehabilitation program to maximize functional independence : Yes  Medical management of patient stability for increased activity during participation in an intensive rehabilitation  regime.: Yes Analysis of laboratory values and/or radiology reports with any subsequent need for medication adjustment and/or medical intervention. : Yes   I attest that I was present, lead the team conference, and concur with the assessment and plan of the team.   Tennis Must 05/20/2022, 2:58 PM

## 2022-05-20 NOTE — Progress Notes (Signed)
Pt complaining of blurred vision this morning. Vitals taken 133/97. PA Rinaldo Cloud made aware. Orthostatic vitals taken. See MAR. Pt's vision is better after movement. Will continue to monitor for reoccurrence and/or new changes

## 2022-05-21 DIAGNOSIS — R42 Dizziness and giddiness: Secondary | ICD-10-CM

## 2022-05-21 DIAGNOSIS — R112 Nausea with vomiting, unspecified: Secondary | ICD-10-CM

## 2022-05-21 DIAGNOSIS — D649 Anemia, unspecified: Secondary | ICD-10-CM

## 2022-05-21 NOTE — Progress Notes (Signed)
Physical Therapy Session Note  Patient Details  Name: James Lambert MRN: [id number] Date of Birth: 1966/08/12  Today's Date: 05/21/2022 PT Individual Time: 1115-1200 PT Individual Time Calculation (min): 45 min   Short Term Goals: Week 1:  PT Short Term Goal 1 (Week 1): Pt will ambulate with RW 253ft with CGA PT Short Term Goal 2 (Week 1): Pt will perform Berg balance scale to assess safety and function PT Short Term Goal 3 (Week 1): Pt will propell WC 135ft with supervision assist PT Short Term Goal 4 (Week 1): Pt will transfer to and from [facility] with supervision assist consistnetly Week 2:    Week 3:     Skilled Therapeutic Interventions/Progress Updates:   Pt received supine in bed and agreeable to PT. Supine>sit transfer without assist or cues. No complaints of nausea or dizziness. Stand pivot transfer to [facility] with CGA for safety and no AD. BUE supported on WC arm rests. Pt transported to entrance of [facility]. Pt performed 5 time sit<>stand (5xSTS): 66 sec (>15 sec indicates increased fall risk)   Gait training with RW x 250ft +279ft with CGA-supervision assist with min cues only for AD management and improved hip/knee flexion with RLE, but pt continues to use circumduction to advance the RLE.  WC mobility to for functional use of RUE x 244ft with min assist from PT navigate cracks in sidewalk and uphill grade.  Sit<>stand with supervision assist from [facility] with cues for UE placement and anterior weight shift initially, improved technique with increased repetitions throughout treatment Pt returned to room and performed ambulatory transfer to bathroom with RW and supervision assist. Able to urinate standing at toilet with supervision assist from PT. Tranfers to bed with supervision assist and RW. Sit>supine completed without assist, and left supine in bed with call bell in reach and all needs met.          Therapy Documentation Precautions:  Precautions Precautions: Fall Restrictions Weight  Bearing Restrictions: No   Pain: denies    Therapy/Group: Individual Therapy  Lorie Phenix 05/21/2022, 1:01 PM

## 2022-05-21 NOTE — Progress Notes (Signed)
Occupational Therapy Session Note  Patient Details  Name: James Lambert MRN: [id number] Date of Birth: Sep 01, 1966  Today's Date: 05/21/2022 OT Individual Time: 3329-5188 OT Individual Time Calculation (min): 74 min    Short Term Goals: Week 1:  OT Short Term Goal 1 (Week 1): Pt will complete 2/3 toileting steps with CGA OT Short Term Goal 2 (Week 1): Pt will donn LB clothing with CGA for balance using AE PRN OT Short Term Goal 3 (Week 1): Pt will tolerate stance position >5 mins in prep for ADL tasks  Skilled Therapeutic Interventions/Progress Updates:    Pt resting in bed upon arrival and agreeable to getting OOB and bathing/dressing. Supine>sit EOB with supervision. Sit<>stand and amb with RW to sink with supervision. Pt completed bathing primarily standing at sink with CGA. Amb with RW to bathroom to urinate while standing with CGA. Sit<>stand from w/c X 5 with CGA/supervision. Pt transitioned to day room and completed table tasks with focus on RUE use and problem solving. Pt completed pipe tree structure using BUE with min verbal cues for problem solving. Pt also completed colored peg board pattern with min verbal cues for problem solving. Pt used RUE appropriately throughout session. Pt c/o blurred vision and diplopia midway in session but resolved near the end of session. Pt returned to room and requested to return to bed. Pt remained in bed with all needs within reach and bed alarm activated.   Therapy Documentation Precautions:  Precautions Precautions: Fall Restrictions Weight Bearing Restrictions: No General:   Vital Signs: Therapy Vitals Temp: 97.7 F (36.5 C) Temp Source: Oral Pulse Rate: (!) 53 Resp: 18 BP: (!) 139/101 Patient Position (if appropriate): Lying Oxygen Therapy SpO2: 100 % O2 Device: Room Air Pain:   ADL: ADL Eating: Not assessed Grooming: Supervision/safety Where Assessed-Grooming: Standing at sink Upper Body Bathing: Unable to assess Lower  Body Bathing: Unable to assess Upper Body Dressing: Unable to assess Lower Body Dressing: Minimal assistance Where Assessed-Lower Body Dressing: Sitting at sink, Standing at sink Toileting: Minimal assistance Where Assessed-Toileting: Teacher, adult education: Curator Method: Proofreader: Other (comment) (RW) Tub/Shower Transfer: Unable to assess Tub/Shower Transfer Method: Unable to assess Film/video editor: Unable to assess [employer] Transfer Method: Unable to assess Vision   Perception    Praxis   Balance   Exercises:   Other Treatments:     Therapy/Group: Individual Therapy  [name] 05/21/2022, 9:30 AM

## 2022-05-21 NOTE — Progress Notes (Signed)
PROGRESS NOTE   Subjective/Complaints:  Nausea and dizziness a little improved today. He is taking PRN compazine.  Eating 100 percent meals.   ROS:   Pt denies SOB, abd pain, CP, no  vision changes. + dizziness -improved   Objective:   No results found. Recent Labs    05/19/22 0618  WBC 5.4  HGB 10.9*  HCT 33.0*  PLT 387    Recent Labs    05/19/22 0618  NA 135  K 4.3  CL 103  CO2 27  GLUCOSE 96  BUN 13  CREATININE 1.30*  CALCIUM 8.5*     Intake/Output Summary (Last 24 hours) at 05/21/2022 0801 Last data filed at 05/21/2022 0745 Gross per 24 hour  Intake 589 ml  Output 975 ml  Net -386 ml         Physical Exam: Vital Signs Blood pressure (!) 139/101, pulse (!) 53, temperature 97.7 F (36.5 C), temperature source Oral, resp. rate 18, height 6\' 2"  (1.88 m), weight 113 kg, SpO2 100 %.    General: awake, alert, appropriate, sitting up slightly in bed; NAD HENT: conjugate gaze; oropharynx moist CV: regular rate; no JVD Pulmonary: CTA B/L; no W/R/R- good air movement GI: somewhat firm; NT; ND; hypoactive BS Psychiatric: appropriate but VERY flat affect/flat facies Neurological: Ox3- but slow to respond Psych: Pleasant Skin: Clean in clean and intact with exception of Left and Right groin.  Presence of Hidradenitis Suppurativa adjacent to scrotum and on left thigh and right thigh  Side just next to scrotum on both sided has a 4-5 cm are that is indurated, red, with sanguinous drainage.  This area has a pungent smell, but improved from yesterday. Area on upper left thigh is not draining but has skin pattern of dimpling with HS.  On bilateral buttocks has three areas covered with foam pads with dimpling pattern of HS minimal sanguinous drainage. Neuro: Cranial nerves II through XII intact.  Speech content is fluent but some mild hesitancy.  No aphasia present patient.  Patient able to recall 1 of 3 words with  3-minute wait between can follow commands and answer questions appropriately.  Sensation is intact to light touch for both bilateral and upper lower and lower extremities.  Strength 5 out of 5 in left upper and left lower extremity.  Strength 4+ out of 5 throughout the right upper extremity.  Strength 4 out of 5 proximal right lower extremity..  4+ out of 5 distal right lower extremity.  Normal coordination present Musculoskeletal: No range of motion deficits noted.  Muscle bulk and tone is normal.  No swelling or joint tenderness noticed.  PIV in right upper extremity  Assessment/Plan: 1. Functional deficits which require 3+ hours per day of interdisciplinary therapy in a comprehensive inpatient rehab setting. Physiatrist is providing close team supervision and 24 hour management of active medical problems listed below. Physiatrist and rehab team continue to assess barriers to discharge/monitor patient progress toward functional and medical goals  Care Tool:  Bathing  Bathing activity did not occur: Refused (already bathed with PT per report)           Bathing assist  Upper Body Dressing/Undressing Upper body dressing Upper body dressing/undressing activity did not occur (including orthotics): Refused (already donned shirt with PT)      Upper body assist      Lower Body Dressing/Undressing Lower body dressing      What is the patient wearing?: Pants     Lower body assist Assist for lower body dressing: Minimal Assistance - Patient > 75%     Toileting Toileting    Toileting assist Assist for toileting: Minimal Assistance - Patient > 75%     Transfers Chair/bed transfer  Transfers assist     Chair/bed transfer assist level: Contact Guard/Touching assist     Locomotion Ambulation   Ambulation assist      Assist level: Contact Guard/Touching assist Assistive device: Walker-rolling Max distance: 100'   Walk 10 feet activity   Assist     Assist  level: Contact Guard/Touching assist Assistive device: Walker-rolling   Walk 50 feet activity   Assist Walk 50 feet with 2 turns activity did not occur: Refused  Assist level: Contact Guard/Touching assist Assistive device: Walker-rolling    Walk 150 feet activity   Assist Walk 150 feet activity did not occur: Refused  Assist level: Contact Guard/Touching assist Assistive device: Walker-rolling    Walk 10 feet on uneven surface  activity   Assist Walk 10 feet on uneven surfaces activity did not occur: Refused         Wheelchair     Assist Is the patient using a wheelchair?: Yes Type of Wheelchair: Manual Wheelchair activity did not occur: Refused         Wheelchair 50 feet with 2 turns activity    Assist    Wheelchair 50 feet with 2 turns activity did not occur: Refused       Wheelchair 150 feet activity     Assist  Wheelchair 150 feet activity did not occur: Refused       Blood pressure (!) 139/101, pulse (!) 53, temperature 97.7 F (36.5 C), temperature source Oral, resp. rate 18, height 6\' 2"  (1.88 m), weight 113 kg, SpO2 100 %.    Medical Problem List and Plan: 1. 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             -patient may shower             -ELOS/Goals: 5-7 days, Mod I with PT/OT/SLP  Continue CIR- PT, OT and SLP  -expected dc 05/27/22 2.  Antithrombotics: -DVT/anticoagulation:  Mechanical: Sequential compression devices, below knee Bilateral lower extremities             -antiplatelet therapy: Plavix and aspirin for 3 weeks followed by Plavix alone 6/10 continue dual antiplatelet therapy 3. Pain Management: Tramadol, Tylenol as needed 6/10 pain is well controlled today with current regimen 4. Mood: LCSW to evaluate and provide emotional support             -antipsychotic agents: n/a 5. Neuropsych: This patient is  capable of making decisions on his own behalf. 6. Skin/Wound Care: Routine skin care checks             -- Hidradenitis R groin: Local wound care; follow-up PCP             -On Vanc and zosyn for 48 hours, consider de-escalation to augemtin 6/10 Left groin hidradenitis, continue antibiotics vancomycin and Zosyn per pharmacy consider de-escalation to Augmentin  we will further discuss with Dr. Riley KillSwartz MD tomorrow in addition to discussing with pharmacy as well.  Wound visualized today does have foul smell to it is tender to palpation. 6/11 Appears that HS is on both right and left groin area with extension to bilateral thighs and buttocks.  Area directly adjacent to scrotum is raw with bloody drainage, smell less foul today than yesterday.  But due to opens wounds continue IV antibiotics today and overnight.  Primary team to decide to convert over to po antibiotics.  6/12- needs general surgery to look at him- to see if needs I&D- will decide about IV ABX once decided if needs I&D.   6/13- changed to Vibramycin and clindamycin topical gel- stopped IV ABX- should hopefully help nausea 7. Fluids/Electrolytes/Nutrition: Routine ins and outs and follow-up chemistries 8: Hypertension: Norvasc 10 mg daily initiated post Cleviprex, Toprol-xl 25mg  -Has had a few mildly elevated BPs, continue to monitor trend    05/21/2022    6:22 AM 05/20/2022    7:40 PM 05/20/2022    1:50 PM  Vitals with BMI  Systolic 139 145 [id number]  Diastolic 101 105 98  Pulse 53 59 59  6/10 still with some elevated blood pressures, continue to trend with Toprol XL 25 mg. 6/12- BP very slightlyelevated- con't to monitor trend 6/13- BP running 141-160s/90s- if doesn't improve in next 24-48 hours, can increase BP meds. Is c/o light headedness and dizziness- however not clear if orthostatic hypotension- will check orthostatics.  6/14 Orthostitics with mild decrease in BP and mild HR increase, Will recheck BMP tomorrow 9: Hyperlipidemia:  Continue atorvastatin 80 mg daily 10: Tobacco use: Cessation counseling -Nicotine patch 11: Alcohol abuse: Cessation counseling -Thiamine supplementation, multivitamin, folic acid 6/11 No withdrawal symptoms today, cessation counseling 12: THC use: Advised regarding stroke risk 13: Obesity: Dietary recommendations 14. Nausea/vomiting  6/13- will start Zofran prn in addition to compazine for nausea- to help refractory N/V- thinking could be form IV ABX, but they stopped yesterday. Pt says LBM yesterday, but if nasuea doesn't improve, suggest checking KUB  -6/14 Nausea improving, he used compazine but not zofran this AM. Eating his meals. 15. Anemia  -Recheck labs tomorrow   LOS: 5 days A FACE TO FACE EVALUATION WAS PERFORMED  Fanny DanceYuri Vergie Zahm 05/21/2022, 8:01 AM

## 2022-05-21 NOTE — Progress Notes (Signed)
Physical Therapy Session Note  Patient Details  Name: James Lambert MRN: [id number] Date of Birth: 07-Jul-1966  Today's Date: 05/21/2022 PT Individual Time: 1315-1400 PT Individual Time Calculation (min): 45 min   Short Term Goals: Week 1:  PT Short Term Goal 1 (Week 1): Pt will ambulate with RW 25ft with CGA PT Short Term Goal 2 (Week 1): Pt will perform Berg balance scale to assess safety and function PT Short Term Goal 3 (Week 1): Pt will propell WC 173ft with supervision assist PT Short Term Goal 4 (Week 1): Pt will transfer to and from [facility] with supervision assist consistnetly  Skilled Therapeutic Interventions/Progress Updates:    Pt received supine in bed asleep, lunch at bedside and pt has not yet eaten. Pt arousable, given time to eat lunch prior to initiating therapy session. Pt missed 15 min of scheduled therapy session to allow time to eat lunch. When this therapist returned pt agreeable to session. No complaints of pain. Sit to stand with CGA to RW during session. Ambulation 2 x 150 ft with RW and CGA for balance, flexed trunk posture, circumduction of BLE noted. Cued pt to try heel/toe gait pattern but he continues to exhibit circumduction and decreased knee flexion during gait. Ascend/descend 8 x 6" stairs with 2 handrails and min A for balance. Pt also exhibits some circumduction with BLE when navigating stairs. Standing alt L/R marches with RW and CGA for balance, 2 x 10 reps. Standing mini-squats 2 x 10 reps with RW and min A for balance, max cueing for correct body positioning during exercise. Pt has onset of L>R HS pain following squats. Seated B HS stretch 3 x 60 sec each B with use of gait belt. Pt returned to room at end of session. Standing balance with Supervision and RW while urinating at toilet. Pt left seated EOB in room with needs in reach, bed alarm in place at end of session.  Therapy Documentation Precautions:  Precautions Precautions: Fall Restrictions Weight  Bearing Restrictions: No General: PT Amount of Missed Time (min): 15 Minutes PT Missed Treatment Reason: Unavailable (Comment) (eating lunch)      Therapy/Group: Individual Therapy   Peter Congo, PT, DPT, CSRS 05/21/2022, 3:19 PM

## 2022-05-21 NOTE — Progress Notes (Signed)
Pt request to hold tramadol until after breakfast due to Nausea and dizziness yesterday am. Patient did have some vomiting last PM, while eat dinner pt became nauseated and regurgitated undigested food. Patient received anti-nausea meds and stated he felt better. Pt was then able to eat dinner without any complaints of N/V.  Marland Kitchen

## 2022-05-21 NOTE — Progress Notes (Signed)
Speech Language Pathology Daily Session Note  Patient Details  Name: James Lambert MRN: [id number] Date of Birth: [birth year]  Today's Date: 05/21/2022 SLP Individual Time: 1003-1100 SLP Individual Time Calculation (min): 57 min  Short Term Goals: Week 1: SLP Short Term Goal 1 (Week 1): Patient will verbally express wants/needs at the sentence level with Mod I. SLP Short Term Goal 2 (Week 1): Patient will recall new, daily information with Min verbal and visual cues. SLP Short Term Goal 3 (Week 1): Patient will demonstrate functional problem soving for mildly complex tasks with Min verbal cues. SLP Short Term Goal 4 (Week 1): Patient will self-monitor and correct errors during functional tasks with Min verbal cues.  Skilled Therapeutic Interventions: Skilled ST services focused on cognitive skills. Pt supported baseline recall deficits from previous CVAs, requiring use of written aids. SLP continued education of medication management and completed TIB pill organizer. Pt demonstrated mod I verbal and functional problem solving during pill organizer task, with supervision A fade to mod I for error awareness. SLP educated pt in chunking strategies to aid in recall of medication function by AM/afternoon/PM, creating visual aid. Pt was left in room with bed alarm set. Recommend to continue ST services.     Pain Pain Assessment Pain Score: 0-No pain  Therapy/Group: Individual Therapy  Lusia Greis  [facility] 05/21/2022, 2:07 PM

## 2022-05-22 LAB — BASIC METABOLIC PANEL
Anion gap: 9 (ref 5–15)
BUN: 16 mg/dL (ref 6–20)
CO2: 26 mmol/L (ref 22–32)
Calcium: 9.4 mg/dL (ref 8.9–10.3)
Chloride: 101 mmol/L (ref 98–111)
Creatinine, Ser: 1.18 mg/dL (ref 0.61–1.24)
GFR, Estimated: >60 mL/min (ref >60)
Glucose, Bld: 99 mg/dL (ref 70–99)
Potassium: 4.1 mmol/L (ref 3.5–5.1)
Sodium: 136 mmol/L (ref 135–145)

## 2022-05-22 LAB — CBC
HCT: 35.9 % — ABNORMAL LOW (ref 39.0–52.0)
Hemoglobin: 12.1 g/dL — ABNORMAL LOW (ref 13.0–17.0)
MCH: 31 pg (ref 26.0–34.0)
MCHC: 33.7 g/dL (ref 30.0–36.0)
MCV: 92.1 fL (ref 80.0–100.0)
Platelets: 390 10*3/uL (ref 150–400)
RBC: 3.9 MIL/uL — ABNORMAL LOW (ref 4.22–5.81)
RDW: 13.7 % (ref 11.5–15.5)
WBC: 4.4 10*3/uL (ref 4.0–10.5)
nRBC: 0 % (ref 0.0–0.2)

## 2022-05-22 NOTE — Progress Notes (Signed)
Occupational Therapy Session Note  Patient Details  Name: James Lambert MRN: [id number] Date of Birth: Mar 07, 1966  Today's Date: 05/22/2022 OT Individual Time: 1345-1425 OT Individual Time Calculation (min): 40 min    Short Term Goals: Week 1:  OT Short Term Goal 1 (Week 1): Pt will complete 2/3 toileting steps with CGA OT Short Term Goal 2 (Week 1): Pt will donn LB clothing with CGA for balance using AE PRN OT Short Term Goal 3 (Week 1): Pt will tolerate stance position >5 mins in prep for ADL tasks  Skilled Therapeutic Interventions/Progress Updates:    Pt resting EOB upon arrival. OT intervention with focus on family education with brother, [name]. Activities included amb with RW at supervision level, toileting with supervision, TTB transfers with supervision, and sit<>stand from elevated bed surface. Pt's brother states he will place an additional mattress on bed to raise surface. Pt practiced TTD transfers. It was determined that pt already as access to TTB for use at home. Pt and brother verbalized understanding of recommendation for 24 hour supervision. Pt remained seated in w/c with brother present.   Therapy Documentation Precautions:  Precautions Precautions: Fall Restrictions Weight Bearing Restrictions: No Pain: Pain Assessment Pain Scale: 0-10 Pain Score: 0-No pain   Therapy/Group: Individual Therapy  [name] 05/22/2022, 2:40 PM

## 2022-05-22 NOTE — Progress Notes (Signed)
PROGRESS NOTE   Subjective/Complaints:  Nausea is improved. He feels a little stiff when he gets up but this improves when he gets moving. Reports he slept well.   ROS:   Pt denies SOB, abd pain, CP, no  vision changes. No abdominal pain    Objective:   No results found. Recent Labs    05/22/22 0514  WBC 4.4  HGB 12.1*  HCT 35.9*  PLT 390    Recent Labs    05/22/22 0514  NA 136  K 4.1  CL 101  CO2 26  GLUCOSE 99  BUN 16  CREATININE 1.18  CALCIUM 9.4     Intake/Output Summary (Last 24 hours) at 05/22/2022 1357 Last data filed at 05/22/2022 1337 Gross per 24 hour  Intake 717 ml  Output 2300 ml  Net -1583 ml         Physical Exam: Vital Signs Blood pressure (!) 164/94, pulse (!) 55, temperature 98.1 F (36.7 C), resp. rate 16, height 6\' 2"  (1.88 m), weight 113 kg, SpO2 100 %.    General: awake, alert, appropriate,  sitting in chair HENT: conjugate gaze; oropharynx moist CV: regular rate; no JVD Pulmonary: CTA B/L; no W/R/R- good air movement GI: somewhat firm; NT; ND; normal BS Psychiatric: appropriate but VERY flat affect/flat facies Neurological: Ox3- but slow to respond Psych: Pleasant Skin: Clean in clean and intact with exception of Left and Right groin.  Presence of Hidradenitis Suppurativa adjacent to scrotum and on left thigh and right thigh  Side just next to scrotum on both sided has a 4-5 cm are that is indurated, red, with sanguinous drainage.  This area has a pungent smell, but improved from yesterday. Area on upper left thigh is not draining but has skin pattern of dimpling with HS.  On bilateral buttocks has three areas covered with foam pads with dimpling pattern of HS minimal sanguinous drainage. Neuro: Cranial nerves II through XII intact.  Speech content is fluent but some mild hesitancy.  No aphasia present patient.  Patient able to recall 1 of 3 words with 3-minute wait between can  follow commands and answer questions appropriately.  Sensation is intact to light touch for both bilateral and upper lower and lower extremities.  Strength 5 out of 5 in left upper and left lower extremity.  Strength 4+ out of 5 throughout the right upper extremity.  Strength 4 out of 5 proximal right lower extremity..  4+ out of 5 distal right lower extremity.  Normal coordination present Musculoskeletal: No range of motion deficits noted.  Muscle bulk and tone is normal.  No swelling or joint tenderness noticed.   Assessment/Plan: 1. Functional deficits which require 3+ hours per day of interdisciplinary therapy in a comprehensive inpatient rehab setting. Physiatrist is providing close team supervision and 24 hour management of active medical problems listed below. Physiatrist and rehab team continue to assess barriers to discharge/monitor patient progress toward functional and medical goals  Care Tool:  Bathing  Bathing activity did not occur: Refused (already bathed with PT per report) Body parts bathed by patient: Right arm, Left arm, Chest, Abdomen, Front perineal area, Buttocks, Right upper leg, Left upper  leg, Right lower leg, Face   Body parts bathed by helper: Right lower leg, Left lower leg Body parts n/a: Right lower leg, Left lower leg   Bathing assist Assist Level: Minimal Assistance - Patient > 75%     Upper Body Dressing/Undressing Upper body dressing Upper body dressing/undressing activity did not occur (including orthotics): Refused (already donned shirt with PT) What is the patient wearing?: Pull over shirt    Upper body assist Assist Level: Set up assist    Lower Body Dressing/Undressing Lower body dressing      What is the patient wearing?: Pants, Underwear/pull up     Lower body assist Assist for lower body dressing: Contact Guard/Touching assist     Toileting Toileting    Toileting assist Assist for toileting: Contact Guard/Touching assist      Transfers Chair/bed transfer  Transfers assist     Chair/bed transfer assist level: Supervision/Verbal cueing     Locomotion Ambulation   Ambulation assist      Assist level: Contact Guard/Touching assist Assistive device: Walker-rolling Max distance: 150'   Walk 10 feet activity   Assist     Assist level: Contact Guard/Touching assist Assistive device: Walker-rolling   Walk 50 feet activity   Assist Walk 50 feet with 2 turns activity did not occur: Refused  Assist level: Contact Guard/Touching assist Assistive device: Walker-rolling    Walk 150 feet activity   Assist Walk 150 feet activity did not occur: Refused  Assist level: Contact Guard/Touching assist Assistive device: Walker-rolling    Walk 10 feet on uneven surface  activity   Assist Walk 10 feet on uneven surfaces activity did not occur: Refused         Wheelchair     Assist Is the patient using a wheelchair?: Yes Type of Wheelchair: Manual Wheelchair activity did not occur: Refused         Wheelchair 50 feet with 2 turns activity    Assist    Wheelchair 50 feet with 2 turns activity did not occur: Refused       Wheelchair 150 feet activity     Assist  Wheelchair 150 feet activity did not occur: Refused       Blood pressure (!) 164/94, pulse (!) 55, temperature 98.1 F (36.7 C), resp. rate 16, height 6\' 2"  (1.88 m), weight 113 kg, SpO2 100 %.    Medical Problem List and Plan: 1. 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             -patient may shower             -ELOS/Goals: 5-7 days, Mod I with PT/OT/SLP  Continue CIR- PT, OT and SLP  -expected dc 05/27/22 Mod I to sup 2.  Antithrombotics: -DVT/anticoagulation:  Mechanical: Sequential compression devices, below knee Bilateral lower extremities             -antiplatelet therapy: Plavix and aspirin  for 3 weeks followed by Plavix alone 6/10 continue dual antiplatelet therapy 3. Pain Management: Tramadol, Tylenol as needed 6/10 pain is well controlled today with current regimen 4. Mood: LCSW to evaluate and provide emotional support             -antipsychotic agents: n/a 5. Neuropsych: This patient is capable of making decisions on his own behalf. 6. Skin/Wound Care: Routine skin care checks             --  Hidradenitis R groin: Local wound care; follow-up PCP             -On Vanc and zosyn for 48 hours, consider de-escalation to augemtin 6/10 Left groin hidradenitis, continue antibiotics vancomycin and Zosyn per pharmacy consider de-escalation to Augmentin we will further discuss with Dr. Riley Kill MD tomorrow in addition to discussing with pharmacy as well.  Wound visualized today does have foul smell to it is tender to palpation. 6/11 Appears that HS is on both right and left groin area with extension to bilateral thighs and buttocks.  Area directly adjacent to scrotum is raw with bloody drainage, smell less foul today than yesterday.  But due to opens wounds continue IV antibiotics today and overnight.  Primary team to decide to convert over to po antibiotics.  6/12- needs general surgery to look at him- to see if needs I&D- will decide about IV ABX once decided if needs I&D.   6/13- changed to Vibramycin and clindamycin topical gel- stopped IV ABX- should hopefully help nausea 7. Fluids/Electrolytes/Nutrition: Routine ins and outs and follow-up chemistries 8: Hypertension: Norvasc 10 mg daily initiated post Cleviprex, Toprol-xl 25mg  -Has had a few mildly elevated BPs, continue to monitor trend    05/22/2022    1:09 PM 05/22/2022    4:22 AM 05/21/2022    8:21 PM  Vitals with BMI  Systolic 164 138 05/23/2022  Diastolic 94 84 95  Pulse 55 67 63  6/10 still with some elevated blood pressures, continue to trend with Toprol XL 25 mg. 6/12- BP very slightlyelevated- con't to monitor trend 6/13- BP  running 141-160s/90s- if doesn't improve in next 24-48 hours, can increase BP meds. Is c/o light headedness and dizziness- however not clear if orthostatic hypotension- will check orthostatics.  6/14 Orthostitics with mild decrease in BP and mild HR increase, Will recheck BMP tomorrow 6/15 A little elevated in afternoon, overall well controlled, monitor 9: Hyperlipidemia: Continue atorvastatin 80 mg daily 10: Tobacco use: Cessation counseling -Nicotine patch 11: Alcohol abuse: Cessation counseling -Thiamine supplementation, multivitamin, folic acid 6/11 No withdrawal symptoms today, cessation counseling 12: THC use: Advised regarding stroke risk 13: Obesity: Dietary recommendations 14. Nausea/vomiting  6/13- will start Zofran prn in addition to compazine for nausea- to help refractory N/V- thinking could be form IV ABX, but they stopped yesterday. Pt says LBM yesterday, but if nasuea doesn't improve, suggest checking KUB  -6/15 Improved, continue to monitor, had BM today 15. Anemia  -HGB increased to 12.1   LOS: 6 days A FACE TO FACE EVALUATION WAS PERFORMED  7/15 05/22/2022, 1:57 PM

## 2022-05-22 NOTE — Progress Notes (Signed)
Occupational Therapy Session Note  Patient Details  Name: James Lambert MRN: [id number] Date of Birth: Aug 30, 1966  Today's Date: 05/22/2022 OT Individual Time: 3500-9381 OT Individual Time Calculation (min): 72 min    Short Term Goals: Week 1:  OT Short Term Goal 1 (Week 1): Pt will complete 2/3 toileting steps with CGA OT Short Term Goal 2 (Week 1): Pt will donn LB clothing with CGA for balance using AE PRN OT Short Term Goal 3 (Week 1): Pt will tolerate stance position >5 mins in prep for ADL tasks  Skilled Therapeutic Interventions/Progress Updates:    Pt resting in bed upon arrival and ready to get OOB for bathing/dressing. Supine>sit EOB with supervision. Pt amb with RW to bathroom to use toilet before returning to sink for bathing/dressing. Toileting tasks with supervision. Pt completed bathing tasks standing at sink with min A for bathing feet when seated. All dressing tasks with supervision. Pt amb with RW to day room and engaged in game of corn hole with supervision standing without UE support. Pt retrieved bean bags from floor using RW for support. Pt able to don gloves on Bil hands without assistance this morning with no apraxia noted. Pt stood at table without support to clean ben bags with wipes. Pt amb with RW to gym and returned to room with supervision using RW. Pt returned to bed. Pt remained n bed with all needs within reach and bed alarm activated.   Therapy Documentation Precautions:  Precautions Precautions: Fall Restrictions Weight Bearing Restrictions: No  Pain: Pt reports his pain is "about the same as usual' but did not rate pain   Therapy/Group: Individual Therapy  [name] 05/22/2022, 9:28 AM

## 2022-05-22 NOTE — Progress Notes (Signed)
Speech Language Pathology Daily Session Note  Patient Details  Name: ASIEL CHROSTOWSKI MRN: [id number] Date of Birth: [birth year]  Today's Date: 05/22/2022 SLP Individual Time: 5102-5852 SLP Individual Time Calculation (min): 28 min  Short Term Goals: Week 1: SLP Short Term Goal 1 (Week 1): Patient will verbally express wants/needs at the sentence level with Mod I. SLP Short Term Goal 2 (Week 1): Patient will recall new, daily information with Min verbal and visual cues. SLP Short Term Goal 3 (Week 1): Patient will demonstrate functional problem soving for mildly complex tasks with Min verbal cues. SLP Short Term Goal 4 (Week 1): Patient will self-monitor and correct errors during functional tasks with Min verbal cues.  Skilled Therapeutic Interventions: Skilled ST treatment focused family education with brother and brother's significant other. SLP facilitated education regarding cognitive-communication deficits in the areas of short-term recall and problem solving as noted during initial evaluation. Educated on compensatory memory strategies and recommendations for sup A with iADLs including money/medication management. Brother stated he plans to provide supervision at discharge. 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 SLP supporting 24 hour supervision at discharge. Brother verbalized understanding of recommendations. Pt/family had additional questions pertaining to discharge that were communicated to SW. Patient was left in wheelchair with alarm activated and immediate needs within reach at end of session. Nurse in to report confirmation of grounds pass request and pt and brother went off unit to cafeteria at end of session. Nurse aware. Brother's significant other remained in room for education with nursing. Continue per current plan of care.      Pain  None/denied  Therapy/Group: Individual Therapy  Tamala Ser 05/22/2022,  3:44 PM

## 2022-05-22 NOTE — Progress Notes (Signed)
Physical Therapy Session Note  Patient Details  Name: James Lambert MRN: [id number] Date of Birth: 26-Aug-1966  Today's Date: 05/22/2022 PT Individual Time: 6808-8110; 3159-4585 PT Individual Time Calculation (min): 30 min and 25 min  Short Term Goals: Week 1:  PT Short Term Goal 1 (Week 1): Pt will ambulate with RW 278ft with CGA PT Short Term Goal 2 (Week 1): Pt will perform Berg balance scale to assess safety and function PT Short Term Goal 3 (Week 1): Pt will propell WC 140ft with supervision assist PT Short Term Goal 4 (Week 1): Pt will transfer to and from [facility] with supervision assist consistnetly  Skilled Therapeutic Interventions/Progress Updates:    Session 1: Pt received supine in bed, agreeable to PT session. No complaints of pain. Bed mobility with Supervision. Sit to stand with Supervision to RW during session, pt requesting hospital bed be elevated for sit to stand but reports his bed at home is low, encouraged pt to work towards standing from lower surfaces. Sit to stand 3 x 5 reps from progressively lower surface to RW initially with Supervision increasing to CGA with decrease in surface height. Ambulation 2 x 100 ft with RW at CGA level, flexed trunk and ongoing RLE circumduction. Cues for R heel strike and knee flexion with minimal change in gait noted. Standing alt L/R 4" step-ups with RW and CGA to min A for balance for hip strengthening, focus on decreasing circumduction with RLE, 3 x 15 reps to fatigue. Standing squats 2 x 10 reps with RW and CGA for balance with use of mirror for visual feedback for body positioning. Pt exhibits improved body mechanics as compared to previous session, does still require tactile and verbal cues for correct exercise performance. Standing balance while toileting with RW and Supervision. Pt returned to bed at end of session, Supervision for bed mobility. Pt left supine in bed with needs in reach, bed alarm in place.  Session 2: Pt received seated  in w/c in room with brother [name] present for family education session. Discussed pt's anticipated d/c LOF at Supervision and how to safely provide Supervision assist for transfers and gait with RW. Demonstrated how to safely provide CGA level for stair navigation with 2 handrails, pt's brother able to perform return demonstration of stair navigation. Car transfer with min A needed for RLE management in/out of car, pt's brother able to provide hands-on assist. Discussed that pt has goals of mod I for transfers and Supervision for gait upon d/c home and that therapy recommending 24/7 Supervision. Pt and his family understanding of therapy recommendations. Encouraged pt remain seated in w/c for SLP session, pt requests to sit EOB. Pt left seated EOB in room with needs in reach, family present.  Therapy Documentation Precautions:  Precautions Precautions: Fall Restrictions Weight Bearing Restrictions: No       Therapy/Group: Individual Therapy   Peter Congo, PT, DPT, CSRS 05/22/2022, 12:16 PM

## 2022-05-23 DIAGNOSIS — E785 Hyperlipidemia, unspecified: Secondary | ICD-10-CM

## 2022-05-23 NOTE — Progress Notes (Signed)
Occupational Therapy Session Note  Patient Details  Name: James Lambert MRN: [id number] Date of Birth: 02/09/1966  Today's Date: 05/23/2022 OT Individual Time: 3005-1102 OT Individual Time Calculation (min): 70 min    Short Term Goals: Week 1:  OT Short Term Goal 1 (Week 1): Pt will complete 2/3 toileting steps with CGA OT Short Term Goal 1 - Progress (Week 1): Met OT Short Term Goal 2 (Week 1): Pt will donn LB clothing with CGA for balance using AE PRN OT Short Term Goal 2 - Progress (Week 1): Met OT Short Term Goal 3 (Week 1): Pt will tolerate stance position >5 mins in prep for ADL tasks OT Short Term Goal 3 - Progress (Week 1): Met  Skilled Therapeutic Interventions/Progress Updates:    Pt resting in bed upon arrival. OT intervention with focus on funcitonal amb with RW, standing balance, safety awareness, and activity tolerance to increase indepdnence with BADLs. Supine>sit EOB with supervision. Pt requested to use toilet and amb with RW (CGA) into bathroom and stood at toilet to urinate. Pt required min verbal cues for safety awareness entering and exiting bathroom. Pt amb with RW to gym and engaged in standing balance activities on compliant and noncompliant surfaces. Pt tossed 1kg ball against rebouder 3x15 with CGA. Pto stood on Airex to attach and remoce clothes pins from basketball net-CGA for balance. Pt donned gloves and stood on Airex to clean clothes pins before amb with RW back to room. Pain in Rt knee as noted below. Pt returned to bed. Pt remained in bed with all needs wihtin reach and bed alar activated.   Therapy Documentation Precautions:  Precautions Precautions: Fall Restrictions Weight Bearing Restrictions: No  Pain: Pt c/o Rt knee pain with ambulation this morning (unrated); repositioned and rest   Therapy/Group: Individual Therapy  Leroy Libman 05/23/2022, 9:45 AM

## 2022-05-23 NOTE — Progress Notes (Signed)
Speech Language Pathology Daily Session Note  Patient Details  Name: James Lambert MRN: [id number] Date of Birth: 12-25-1965  Today's Date: 05/23/2022 SLP Individual Time: 1346-1440 SLP Individual Time Calculation (min): 54 min  Short Term Goals: Week 1: SLP Short Term Goal 1 (Week 1): Patient will verbally express wants/needs at the sentence level with Mod I. SLP Short Term Goal 2 (Week 1): Patient will recall new, daily information with Min verbal and visual cues. SLP Short Term Goal 3 (Week 1): Patient will demonstrate functional problem soving for mildly complex tasks with Min verbal cues. SLP Short Term Goal 4 (Week 1): Patient will self-monitor and correct errors during functional tasks with Min verbal cues.  Skilled Therapeutic Interventions: Pt seen for skilled ST with focus on cognitive goals, pt in bed and agreeable to therapeutic tasks. Pt coming EOB for session. SLP facilitating simple money management tasks (counting bills/coins and making change) by providing overall mod A cues for accuracy. Pt very surprised with how difficult task was and reports a definite change from baseline function. Pt also completing simple calculations with 50% accuracy, again with reported difficulty differing from baseline. Pt requires min A cues for error awareness and repair during tasks, stating "I'm going blank" and "this is making me go cross eyed". Pt with noted and reported fatigue at end of session, returned supine to rest with bed alarm set and call button within reach. Cont ST POC.  Pain Pain Assessment Pain Scale: 0-10 Pain Score: 0-No pain  Therapy/Group: Individual Therapy  Tacey Ruiz 05/23/2022, 2:32 PM

## 2022-05-23 NOTE — Progress Notes (Signed)
Occupational Therapy Weekly Progress Note  Patient Details  Name: James Lambert MRN: [id number] Date of Birth: 1966-08-26  Beginning of progress report period: May 17, 2022 End of progress report period: May 23, 2022   Patient has met 3 of 3 short term goals.  Pt is making steady progress towards LTG of supervisin/mod I overall. Pt currently requires CGA for standing balance during LB dressing tasks. Pt completes bathing tasks with supervision. Pt completes toileting tasks with supervision. Functional tranfsers with supervision and mod verbal cues for safety awareness and RW mgmt. Pt's brother, [name], has been present for education and has verbalized understanding of recommendation for 24 hour supervision.  Patient continues to demonstrate the following deficits: {impairments:3041632} and therefore will continue to benefit from skilled OT intervention to enhance overall performance with {ADL/iADL:3041649}.  Patient {LTG progression:3041653}.  {plan of JOIN:8676720}  OT Short Term Goals Week 1:  OT Short Term Goal 1 (Week 1): Pt will complete 2/3 toileting steps with CGA OT Short Term Goal 1 - Progress (Week 1): Met OT Short Term Goal 2 (Week 1): Pt will donn LB clothing with CGA for balance using AE PRN OT Short Term Goal 2 - Progress (Week 1): Met OT Short Term Goal 3 (Week 1): Pt will tolerate stance position >5 mins in prep for ADL tasks OT Short Term Goal 3 - Progress (Week 1): Met Week 2:  OT Short Term Goal 1 (Week 2): STG=LTG 2/2 ELOS (continue working towards LTG of supervision/mod I)   Leroy Libman 05/23/2022, 6:41 AM

## 2022-05-23 NOTE — Progress Notes (Signed)
PROGRESS NOTE   Subjective/Complaints:  Pt with no new complaints or concerns this AM.   ROS:   Pt denies fever, chills, SOB, abd pain, CP, no  vision changes.    Objective:   No results found. Recent Labs    05/22/22 0514  WBC 4.4  HGB 12.1*  HCT 35.9*  PLT 390    Recent Labs    05/22/22 0514  NA 136  K 4.1  CL 101  CO2 26  GLUCOSE 99  BUN 16  CREATININE 1.18  CALCIUM 9.4     Intake/Output Summary (Last 24 hours) at 05/23/2022 1327 Last data filed at 05/23/2022 0911 Gross per 24 hour  Intake 970 ml  Output 2850 ml  Net -1880 ml         Physical Exam: Vital Signs Blood pressure (!) 146/76, pulse 65, temperature 98.4 F (36.9 C), temperature source Oral, resp. rate 16, height 6\' 2"  (1.88 m), weight 113 kg, SpO2 99 %.    General: awake, alert, appropriate,  sitting in bed HENT: conjugate gaze; oropharynx moist CV: RRR; no JVD Pulmonary: CTA B/L; no W/R/R- good air movement GI: somewhat firm; NT; ND; normal BS Psychiatric: appropriate but VERY flat affect/flat facies Neurological: Ox3- but slow to respond Psych: Pleasant Skin: Clean in clean and intact with exception of Left and Right groin.  Presence of Hidradenitis Suppurativa adjacent to scrotum and on left thigh and right thigh - appears improved from admission  Neuro: Cranial nerves II through XII intact.  Speech content is fluent but some mild hesitancy.  No aphasia present patient.  Patient able to recall 1 of 3 words with 3-minute wait between can follow commands and answer questions appropriately.  Sensation is intact to light touch for both bilateral and upper lower and lower extremities.  Strength 5 out of 5 in left upper and left lower extremity.  Strength 4+ out of 5 throughout the right upper extremity.  Strength 4 out of 5 proximal right lower extremity..  4+ out of 5 distal right lower extremity.  Normal coordination  present Musculoskeletal: No range of motion deficits noted.  Muscle bulk and tone is normal.  No swelling or joint tenderness noticed.   Assessment/Plan: 1. Functional deficits which require 3+ hours per day of interdisciplinary therapy in a comprehensive inpatient rehab setting. Physiatrist is providing close team supervision and 24 hour management of active medical problems listed below. Physiatrist and rehab team continue to assess barriers to discharge/monitor patient progress toward functional and medical goals  Care Tool:  Bathing  Bathing activity did not occur: Refused (already bathed with PT per report) Body parts bathed by patient: Right arm, Left arm, Chest, Abdomen, Front perineal area, Buttocks, Right upper leg, Left upper leg, Right lower leg, Face   Body parts bathed by helper: Right lower leg, Left lower leg Body parts n/a: Right lower leg, Left lower leg   Bathing assist Assist Level: Minimal Assistance - Patient > 75%     Upper Body Dressing/Undressing Upper body dressing Upper body dressing/undressing activity did not occur (including orthotics): Refused (already donned shirt with PT) What is the patient wearing?: Pull over shirt  Upper body assist Assist Level: Set up assist    Lower Body Dressing/Undressing Lower body dressing      What is the patient wearing?: Pants, Underwear/pull up     Lower body assist Assist for lower body dressing: Contact Guard/Touching assist     Toileting Toileting    Toileting assist Assist for toileting: Contact Guard/Touching assist     Transfers Chair/bed transfer  Transfers assist     Chair/bed transfer assist level: Supervision/Verbal cueing     Locomotion Ambulation   Ambulation assist      Assist level: Contact Guard/Touching assist Assistive device: Walker-rolling Max distance: 150'   Walk 10 feet activity   Assist     Assist level: Contact Guard/Touching assist Assistive device:  Walker-rolling   Walk 50 feet activity   Assist Walk 50 feet with 2 turns activity did not occur: Refused  Assist level: Contact Guard/Touching assist Assistive device: Walker-rolling    Walk 150 feet activity   Assist Walk 150 feet activity did not occur: Refused  Assist level: Contact Guard/Touching assist Assistive device: Walker-rolling    Walk 10 feet on uneven surface  activity   Assist Walk 10 feet on uneven surfaces activity did not occur: Refused         Wheelchair     Assist Is the patient using a wheelchair?: Yes Type of Wheelchair: Manual Wheelchair activity did not occur: Refused         Wheelchair 50 feet with 2 turns activity    Assist    Wheelchair 50 feet with 2 turns activity did not occur: Refused       Wheelchair 150 feet activity     Assist  Wheelchair 150 feet activity did not occur: Refused       Blood pressure (!) 146/76, pulse 65, temperature 98.4 F (36.9 C), temperature source Oral, resp. rate 16, height 6\' 2"  (1.88 m), weight 113 kg, SpO2 99 %.    Medical Problem List and Plan: 1. 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             -patient may shower             -ELOS/Goals: 5-7 days, Mod I with PT/OT/SLP  Continue CIR- PT, OT and SLP  -expected dc 05/27/22 Mod I to sup  -He has ambulated 125 feet with RW and CGA 2.  Antithrombotics: -DVT/anticoagulation:  Mechanical: Sequential compression devices, below knee Bilateral lower extremities             -antiplatelet therapy: Plavix and aspirin for 3 weeks followed by Plavix alone 6/10 continue dual antiplatelet therapy 3. Pain Management: Tramadol, Tylenol as needed 6/10 pain is well controlled today with current regimen 4. Mood: LCSW to evaluate and provide emotional support             -antipsychotic agents: n/a 5. Neuropsych: This patient is capable of  making decisions on his own behalf. 6. Skin/Wound Care: Routine skin care checks             -- Hidradenitis R groin: Local wound care; follow-up PCP             -On Vanc and zosyn for 48 hours, consider de-escalation to augemtin 6/10 Left groin hidradenitis, continue antibiotics vancomycin and Zosyn per pharmacy consider de-escalation to Augmentin we will further discuss with Dr. 8/10 MD tomorrow in addition to discussing with pharmacy  as well.  Wound visualized today does have foul smell to it is tender to palpation. 6/11 Appears that HS is on both right and left groin area with extension to bilateral thighs and buttocks.  Area directly adjacent to scrotum is raw with bloody drainage, smell less foul today than yesterday.  But due to opens wounds continue IV antibiotics today and overnight.  Primary team to decide to convert over to po antibiotics.  6/12- needs general surgery to look at him- to see if needs I&D- will decide about IV ABX once decided if needs I&D.   6/13- changed to Vibramycin and clindamycin topical gel- stopped IV ABX- should hopefully help nausea 7. Fluids/Electrolytes/Nutrition: Routine ins and outs and follow-up chemistries 8: Hypertension: Norvasc 10 mg daily initiated post Cleviprex, Toprol-xl 25mg  -Has had a few mildly elevated BPs, continue to monitor trend    05/23/2022   12:59 PM 05/23/2022    3:26 AM 05/22/2022    7:34 PM  Vitals with BMI  Systolic 146 140 05/24/2022  Diastolic 76 97 90  Pulse 65 64 61  6/10 still with some elevated blood pressures, continue to trend with Toprol XL 25 mg. 6/12- BP very slightlyelevated- con't to monitor trend 6/13- BP running 141-160s/90s- if doesn't improve in next 24-48 hours, can increase BP meds. Is c/o light headedness and dizziness- however not clear if orthostatic hypotension- will check orthostatics.  6/14 Orthostitics with mild decrease in BP and mild HR increase 6/16 Fairly well controlled, continue current treatment 9:  Hyperlipidemia: Continue atorvastatin 80 mg daily, discussed healthy diet 10: Tobacco use: Cessation counseling -Nicotine patch 11: Alcohol abuse: Cessation counseling -Thiamine supplementation, multivitamin, folic acid 6/11 No withdrawal symptoms today, cessation counseling 12: THC use: Advised regarding stroke risk 13: Obesity: Dietary recommendations 14. Nausea/vomiting  6/13- will start Zofran prn in addition to compazine for nausea- to help refractory N/V- thinking could be form IV ABX, but they stopped yesterday. Pt says LBM yesterday, but if nasuea doesn't improve, suggest checking KUB  -6/15 Improved, continue to monitor, had BM today  -6/16 eating most his meals, improved 15. Anemia  -HGB increased to 12.1   LOS: 7 days A FACE TO FACE EVALUATION WAS PERFORMED  10-05-1983 05/23/2022, 1:27 PM

## 2022-05-23 NOTE — Progress Notes (Signed)
Occupational Therapy Session Note  Patient Details  Name: James Lambert MRN: [id number] Date of Birth: [birth year]  Today's Date: 05/23/2022 OT Individual Time: 1300-1341 OT Individual Time Calculation (min): 41 min    Short Term Goals: Week 2:  OT Short Term Goal 1 (Week 2): STG=LTG 2/2 ELOS (continue working towards LTG of supervision/mod I)  Skilled Therapeutic Interventions/Progress Updates:    Pt resting in bed upon arrival. OT intervention with focus on toileting and functional amb with RW in community setting to increase independence with BADLs and prepare for discharge home next week. Supine>sit EOB with supervision and transfer to w/c. Pt transported for time mgmt and energy conservation to [facility]. Pt amb with RW on sidewalk and uneven surface with CGA and min verbal cues for safety awareness with surface changes. Pt returned to room and amb in hallway to return to room. Pt requested to use toilet and amb with RW to stand at toilet to urinate. Pt returned to room and washed his hands prior to returning to bed. Pt remained in bed with all needs within reach and bed alarm activated.   Therapy Documentation Precautions:  Precautions Precautions: Fall Restrictions Weight Bearing Restrictions: No Pain: Pt c/o Rt knee pain with noticeable stiffness when ambulating; rest and repositioned   Therapy/Group: Individual Therapy  [name] 05/23/2022, 1:44 PM

## 2022-05-23 NOTE — Progress Notes (Signed)
Physical Therapy Session Note  Patient Details  Name: James Lambert MRN: [id number] Date of Birth: [birth year]  Today's Date: 05/23/2022 PT Individual Time: 1030-1055 PT Individual Time Calculation (min): 25 min   Short Term Goals: Week 1:  PT Short Term Goal 1 (Week 1): Pt will ambulate with RW 264ft with CGA PT Short Term Goal 2 (Week 1): Pt will perform Berg balance scale to assess safety and function PT Short Term Goal 3 (Week 1): Pt will propell WC 129ft with supervision assist PT Short Term Goal 4 (Week 1): Pt will transfer to and from [facility] with supervision assist consistnetly  Skilled Therapeutic Interventions/Progress Updates:   Received pt supine in bed, pt agreeable to PT treatment, and reported pain in bilateral knees (due to weather) but did not rate pain level - RN notified and present to administer pain medication. Session with emphasis on functional mobility/transfers, generalized strengthening and endurance, toileting, and gait training. Pt performed bed mobility with supervision and transfers with RW and supervision from elevated surfaces throughout session. Pt ambulated 154ft x 2 trials with RW and CGA/close supervision to/from dayroom - noted decreased R knee flexion. Attempted standing squats/mini-squats but pt unable to perform due to R knee pain. Performed the following exercises with emphasis on ROM and strength: -seated LAQ 2x10 bilaterally - increased pain in RLE -seated hip flexion 2x15 bilaterally -standing heel raises 2x15 Returned to room and ambulated in/out of bathroom with RW and CGA. Pt able to stand and void with close supervision. Returned to bed and concluded session with pt sitting EOB, needs within reach, and bed alarm on. Provided pt with drinks/snacks.   Therapy Documentation Precautions:  Precautions Precautions: Fall Restrictions Weight Bearing Restrictions: No  Therapy/Group: Individual Therapy Martin Majestic PT, DPT  05/23/2022,  7:10 AM

## 2022-05-24 MED ORDER — METOPROLOL SUCCINATE ER 50 MG PO TB24
50.0000 mg | ORAL_TABLET | Freq: Every day | ORAL | Status: DC
  Administered 2022-05-25 – 2022-05-27 (×3): 50 mg via ORAL
  Filled 2022-05-24 (×3): qty 1

## 2022-05-24 NOTE — Progress Notes (Signed)
Physical Therapy Session Note  Patient Details  Name: James Lambert MRN: [id number] Date of Birth: 02/20/1966  Today's Date: 05/24/2022 PT Individual Time: 1345-1456 PT Individual Time Calculation (min): 71 min   Short Term Goals: Week 1:  PT Short Term Goal 1 (Week 1): Pt will ambulate with RW 2107ft with CGA PT Short Term Goal 2 (Week 1): Pt will perform Berg balance scale to assess safety and function PT Short Term Goal 3 (Week 1): Pt will propell WC 152ft with supervision assist PT Short Term Goal 4 (Week 1): Pt will transfer to and from [facility] with supervision assist consistnetly  Skilled Therapeutic Interventions/Progress Updates:    Received pt supine in bed, pt reluctantly agreeable to PT treatment, and reported pain 8-9/10 in R knee - RN notified and present to administer pain medication. Session with emphasis on functional mobility/transfers, generalized strengthening and endurance, dynamic standing balance/coordination, and gait training. Pt transferred supine<>sitting EOB with supervision and donned shoes with supervision. Pt transferred sit<>stand with RW and supervision from elevated EOB and ambulated 185ft x 2 trials with RW and close supervision to/from dayroom. Pt ambulated in/out of bathroom with RW and supervision and able to stand and void with supervision and no LOB. Pt performed BUE/LE strengthening on Nustep at workload 5 increasing to 6 for 8 minutes for a total of 440 steps with emphasis on cardiovascular endurance. Pt reported R knee felt better after "warming up". Pt ambulated to mat and performed standing alternating toe taps to 6in step 2x10 with BUE support and CGA for balance. Worked on SLS on Airex with 1 UE support and min A x 2 trials bilaterally - pt able to maintain balance for ~15-20 seconds. Transitioned to static tandem balance on Airex with 1 UE support x 1 trial bilaterally for ~20 seconds with CGA/light min A. Then worked on standing perturbations on Airex for ~30  seconds with BUE support. Pt then performed the following exercises with emphasis on LE/UE strength: -hip abduction with 2.5lb ankle weights 2x20 bilaterally -heel raises with 2.5lb ankle weights 2x20 -alternating marches with 2.5lb ankle weights 2x20 -hip extensions with 2.5lb ankle weights 2x20 bilaterally -seated trunk rotations with 4lb medicine ball <>overhead chest press 2x10 -horizontal chest press with 7.5lb dowel 2x20 Attempted standing mini-squats, however pt unable to flex knees due to increased pain. Returned to room and provided pt with ice pack for R knee and snacks. Concluded session with pt sitting EOB with all needs within reach, eating snack.   Therapy Documentation Precautions:  Precautions Precautions: Fall Restrictions Weight Bearing Restrictions: No  Therapy/Group: Individual Therapy Martin Majestic PT, DPT  05/24/2022, 7:06 AM

## 2022-05-24 NOTE — Progress Notes (Addendum)
Occupational Therapy Session Note  Patient Details  Name: James Lambert MRN: [id number] Date of Birth: 11/06/1966  Today's Date: 05/24/2022 OT Individual Time: 1115-1200 OT Individual Time Calculation (min): 45 min    Short Term Goals: Week 1:  OT Short Term Goal 1 (Week 1): Pt will complete 2/3 toileting steps with CGA OT Short Term Goal 1 - Progress (Week 1): Met OT Short Term Goal 2 (Week 1): Pt will donn LB clothing with CGA for balance using AE PRN OT Short Term Goal 2 - Progress (Week 1): Met OT Short Term Goal 3 (Week 1): Pt will tolerate stance position >5 mins in prep for ADL tasks OT Short Term Goal 3 - Progress (Week 1): Met  Skilled Therapeutic Interventions/Progress Updates:     Upon OT arrival, pt semi recumbent in bed. Pt reports no pain but later reports pain in the R LE. Pt did not rate pain but it was noted to affect pt's functional mobility. Pt agreeable to OT tx session. OT intervention with focus on higher level IADLs, functional mobility tolerance, sequencing, dynamic standing balance, self care, and general strengthening. Pt completes supine to sit transfer Mod I and donns shoes Mod I. Pt completes stand pivot transfer into w/c Mod I and was transported to rehab apartment via w/c total A for time management. Pt completes sit to stand transfer with CGA and ambulates within kitchen to retrieve 5 items from pantry one at a time and transport to kitchen counter. Pt able to tolerate mobility and dynamic balance without LOB or seated rest break ~15 minutes.. Pt then was given a sequence to return items to pantry to improve cognition. Pt with 1 error during sequence. Pt then ambulates back to his room with RW and Supervision with w/c follow. No LOB observed or need for rest break. Pt reports the urge to toilet and completes toilet transfer, toileting and hand hygiene at the levels below. Pt then returns to EOB with Supervision and sits EOB to complete B UE exercises with 3lb  dumbbell. Pt completes 1x10 reps of shoulder flex/ext, shoulder abduct/adduct, elbow flex/ext, and overhead press. Pt tolerates well without rest breaks. Pt limited primarily by R LE pain during mobility and continues to benefit from OT services to achieve highest level of function. Pt making progress towards stated OT goals. Pt returned to supine position with Mod I and was left with all safety measures in place.  Therapy Documentation Precautions:  Precautions Precautions: Fall Restrictions Weight Bearing Restrictions: No  ADL: Grooming: Modified independent Where Assessed-Grooming: Standing at sink Lower Body Dressing: Modified independent (shoes) Where Assessed-Lower Body Dressing: Edge of bed Toileting: Supervision/safety Where Assessed-Toileting: Toilet Toilet Transfer: Close supervision Toilet Transfer Method: Ambulating Toilet Transfer Equipment: Other (comment) (RW)   Therapy/Group: Individual Therapy  Kayla  Hirschfelder 05/24/2022, 12:08 PM 

## 2022-05-24 NOTE — Progress Notes (Signed)
PROGRESS NOTE   Subjective/Complaints:  No new complaints this AM. IV to be removed.   ROS:   Pt denies fever, chills, SOB, abd pain, CP, no  vision changes. No cough.    Objective:   No results found. Recent Labs    05/22/22 0514  WBC 4.4  HGB 12.1*  HCT 35.9*  PLT 390    Recent Labs    05/22/22 0514  NA 136  K 4.1  CL 101  CO2 26  GLUCOSE 99  BUN 16  CREATININE 1.18  CALCIUM 9.4     Intake/Output Summary (Last 24 hours) at 05/24/2022 1258 Last data filed at 05/24/2022 1204 Gross per 24 hour  Intake 720 ml  Output 1875 ml  Net -1155 ml         Physical Exam: Vital Signs Blood pressure 136/88, pulse 71, temperature 97.8 F (36.6 C), resp. rate 16, height 6\' 2"  (1.88 m), weight 113 kg, SpO2 99 %.    General: awake, alert, appropriate,  sitting in chair HENT: conjugate gaze; oropharynx moist CV: RRR; no JVD Pulmonary: CTA B/L; no W/R/R- good air movement GI: soft,  NT; ND; + BS Psychiatric: appropriate but VERY flat affect/flat facies Neurological: Ox3- but slow to respond Psych: Pleasant Skin: Clean in clean and intact with exception of Left and Right groin.  Presence of Hidradenitis Suppurativa adjacent to scrotum and on left thigh and right thigh - improving  Neuro: Cranial nerves II through XII intact.  Speech content is fluent but some mild hesitancy.  No aphasia present patient.  Patient able to recall 1 of 3 words with 3-minute wait between can follow commands and answer questions appropriately.  Sensation is intact to light touch for both bilateral and upper lower and lower extremities.  Strength 5 out of 5 in left upper and left lower extremity.  Strength 4+ out of 5 throughout the right upper extremity.  Strength 4 out of 5 proximal right lower extremity..  4+ out of 5 distal right lower extremity.  Normal coordination present Musculoskeletal: No range of motion deficits noted.  Muscle bulk  and tone is normal.  No swelling or joint tenderness noticed.   Assessment/Plan: 1. Functional deficits which require 3+ hours per day of interdisciplinary therapy in a comprehensive inpatient rehab setting. Physiatrist is providing close team supervision and 24 hour management of active medical problems listed below. Physiatrist and rehab team continue to assess barriers to discharge/monitor patient progress toward functional and medical goals  Care Tool:  Bathing  Bathing activity did not occur: Refused (already bathed with PT per report) Body parts bathed by patient: Right arm, Left arm, Chest, Abdomen, Front perineal area, Buttocks, Right upper leg, Left upper leg, Right lower leg, Face   Body parts bathed by helper: Right lower leg, Left lower leg Body parts n/a: Right lower leg, Left lower leg   Bathing assist Assist Level: Minimal Assistance - Patient > 75%     Upper Body Dressing/Undressing Upper body dressing Upper body dressing/undressing activity did not occur (including orthotics): Refused (already donned shirt with PT) What is the patient wearing?: Pull over shirt    Upper body assist Assist Level:  Set up assist    Lower Body Dressing/Undressing Lower body dressing      What is the patient wearing?: Pants, Underwear/pull up     Lower body assist Assist for lower body dressing: Contact Guard/Touching assist     Toileting Toileting    Toileting assist Assist for toileting: Supervision/Verbal cueing     Transfers Chair/bed transfer  Transfers assist     Chair/bed transfer assist level: Supervision/Verbal cueing     Locomotion Ambulation   Ambulation assist      Assist level: Contact Guard/Touching assist Assistive device: Walker-rolling Max distance: 150'   Walk 10 feet activity   Assist     Assist level: Contact Guard/Touching assist Assistive device: Walker-rolling   Walk 50 feet activity   Assist Walk 50 feet with 2 turns activity  did not occur: Refused  Assist level: Contact Guard/Touching assist Assistive device: Walker-rolling    Walk 150 feet activity   Assist Walk 150 feet activity did not occur: Refused  Assist level: Contact Guard/Touching assist Assistive device: Walker-rolling    Walk 10 feet on uneven surface  activity   Assist Walk 10 feet on uneven surfaces activity did not occur: Refused         Wheelchair     Assist Is the patient using a wheelchair?: Yes Type of Wheelchair: Manual Wheelchair activity did not occur: Refused         Wheelchair 50 feet with 2 turns activity    Assist    Wheelchair 50 feet with 2 turns activity did not occur: Refused       Wheelchair 150 feet activity     Assist  Wheelchair 150 feet activity did not occur: Refused       Blood pressure 136/88, pulse 71, temperature 97.8 F (36.6 C), resp. rate 16, height 6\' 2"  (1.88 m), weight 113 kg, SpO2 99 %.    Medical Problem List and Plan: 1. 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             -patient may shower             -ELOS/Goals: 5-7 days, Mod I with PT/OT/SLP  Continue CIR- PT, OT and SLP  -expected dc 05/27/22 Mod I to sup  -He has ambulated 125 feet with RW and CGA 2.  Antithrombotics: -DVT/anticoagulation:  Mechanical: Sequential compression devices, below knee Bilateral lower extremities             -antiplatelet therapy: Plavix and aspirin for 3 weeks followed by Plavix alone 6/10 continue dual antiplatelet therapy 3. Pain Management: Tramadol, Tylenol as needed 6/10 pain is well controlled today with current regimen 4. Mood: LCSW to evaluate and provide emotional support             -antipsychotic agents: n/a 5. Neuropsych: This patient is capable of making decisions on his own behalf. 6. Skin/Wound Care: Routine skin care checks             -- Hidradenitis R  groin: Local wound care; follow-up PCP             -On Vanc and zosyn for 48 hours, consider de-escalation to augemtin 6/10 Left groin hidradenitis, continue antibiotics vancomycin and Zosyn per pharmacy consider de-escalation to Augmentin we will further discuss with Dr. 8/10 MD tomorrow in addition to discussing with pharmacy as well.  Wound visualized today does have foul smell  to it is tender to palpation. 6/11 Appears that HS is on both right and left groin area with extension to bilateral thighs and buttocks.  Area directly adjacent to scrotum is raw with bloody drainage, smell less foul today than yesterday.  But due to opens wounds continue IV antibiotics today and overnight.  Primary team to decide to convert over to po antibiotics.  6/12- needs general surgery to look at him- to see if needs I&D- will decide about IV ABX once decided if needs I&D.   6/13- changed to Vibramycin and clindamycin topical gel- stopped IV ABX- should hopefully help nausea 6/17 DC IV doing well off IV abx 7. Fluids/Electrolytes/Nutrition: Routine ins and outs and follow-up chemistries 8: Hypertension: Norvasc 10 mg daily initiated post Cleviprex, Toprol-xl 25mg  -Has had a few mildly elevated BPs, continue to monitor trend    05/24/2022    7:52 AM 05/24/2022    4:41 AM 05/23/2022    7:20 PM  Vitals with BMI  Systolic 136 154 05/25/2022  Diastolic 88 116 99  Pulse 71 65 57  6/10 still with some elevated blood pressures, continue to trend with Toprol XL 25 mg. 6/12- BP very slightlyelevated- con't to monitor trend 6/13- BP running 141-160s/90s- if doesn't improve in next 24-48 hours, can increase BP meds. Is c/o light headedness and dizziness- however not clear if orthostatic hypotension- will check orthostatics.  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 Nausea/vomiting  6/13- will start Zofran prn in addition to compazine for nausea- to help refractory N/V- thinking could be form IV ABX, but they stopped yesterday. Pt says LBM yesterday, but if nasuea doesn't improve, suggest checking KUB  -6/15 Improved, continue to monitor, had BM today  -6/17 Nausea improved, monitor 15. Anemia  -6/15 HGB increased to 12.1   LOS: 8 days A FACE TO FACE EVALUATION WAS PERFORMED  06-22-1968 05/24/2022, 12:58 PM

## 2022-05-25 NOTE — Progress Notes (Signed)
PROGRESS NOTE   Subjective/Complaints:  Did not sleep well last night because of repairs on his room. No new concerns.   ROS:   Pt denies fever, chills, SOB, abd pain, CP, no  vision changes. Vision changes.    Objective:   No results found. No results for input(s): "WBC", "HGB", "HCT", "PLT" in the last 72 hours.  No results for input(s): "NA", "K", "CL", "CO2", "GLUCOSE", "BUN", "CREATININE", "CALCIUM" in the last 72 hours.   Intake/Output Summary (Last 24 hours) at 05/25/2022 2221 Last data filed at 05/25/2022 2050 Gross per 24 hour  Intake 1200 ml  Output 300 ml  Net 900 ml         Physical Exam: Vital Signs Blood pressure 135/90, pulse (!) 57, temperature 98.5 F (36.9 C), resp. rate 18, height 6\' 2"  (1.88 m), weight 113 kg, SpO2 100 %.    General: awake, alert, appropriate, in bed HENT: conjugate gaze; oropharynx moist CV: RRR Pulmonary: CTA B/L; normal rate GI: soft,  NT; ND; + BS Psychiatric: appropriate Neurological: Ox3- but slow to respond Skin: Clean in clean and intact with exception of Left and Right groin.  Presence of Hidradenitis Suppurativa adjacent to scrotum and on left thigh and right thigh - improving  Neuro: Cranial nerves II through XII intact.  Speech content is fluent but some mild hesitancy.  No aphasia present patient.  Patient able to recall 1 of 3 words with 3-minute wait between can follow commands and answer questions appropriately.  Sensation is intact to light touch for both bilateral and upper lower and lower extremities.  Strength 5 out of 5 in left upper and left lower extremity.  Strength 4+ out of 5 throughout the right upper extremity.  Strength 4 out of 5 proximal right lower extremity..  4+ out of 5 distal right lower extremity.  Normal coordination present Musculoskeletal: No range of motion deficits noted.  Muscle bulk and tone is normal.  No swelling or joint tenderness  noticed.   Assessment/Plan: 1. Functional deficits which require 3+ hours per day of interdisciplinary therapy in a comprehensive inpatient rehab setting. Physiatrist is providing close team supervision and 24 hour management of active medical problems listed below. Physiatrist and rehab team continue to assess barriers to discharge/monitor patient progress toward functional and medical goals  Care Tool:  Bathing  Bathing activity did not occur: Refused (already bathed with PT per report) Body parts bathed by patient: Right arm, Left arm, Chest, Abdomen, Front perineal area, Buttocks, Right upper leg, Left upper leg, Right lower leg, Face   Body parts bathed by helper: Right lower leg, Left lower leg Body parts n/a: Right lower leg, Left lower leg   Bathing assist Assist Level: Minimal Assistance - Patient > 75%     Upper Body Dressing/Undressing Upper body dressing Upper body dressing/undressing activity did not occur (including orthotics): Refused (already donned shirt with PT) What is the patient wearing?: Pull over shirt    Upper body assist Assist Level: Set up assist    Lower Body Dressing/Undressing Lower body dressing      What is the patient wearing?: Pants, Underwear/pull up     Lower body  assist Assist for lower body dressing: Contact Guard/Touching assist     Toileting Toileting    Toileting assist Assist for toileting: Supervision/Verbal cueing     Transfers Chair/bed transfer  Transfers assist     Chair/bed transfer assist level: Supervision/Verbal cueing     Locomotion Ambulation   Ambulation assist      Assist level: Supervision/Verbal cueing Assistive device: Walker-rolling Max distance: >150   Walk 10 feet activity   Assist     Assist level: Supervision/Verbal cueing Assistive device: Walker-rolling   Walk 50 feet activity   Assist Walk 50 feet with 2 turns activity did not occur: Refused  Assist level: Supervision/Verbal  cueing Assistive device: Walker-rolling    Walk 150 feet activity   Assist Walk 150 feet activity did not occur: Refused  Assist level: Supervision/Verbal cueing Assistive device: Walker-rolling    Walk 10 feet on uneven surface  activity   Assist Walk 10 feet on uneven surfaces activity did not occur: Refused   Assist level: Supervision/Verbal cueing Assistive device: Development worker, international aid     Assist Is the patient using a wheelchair?: Yes Type of Wheelchair: Manual Wheelchair activity did not occur: Refused         Wheelchair 50 feet with 2 turns activity    Assist    Wheelchair 50 feet with 2 turns activity did not occur: Refused       Wheelchair 150 feet activity     Assist  Wheelchair 150 feet activity did not occur: Refused       Blood pressure 135/90, pulse (!) 57, temperature 98.5 F (36.9 C), resp. rate 18, height 6\' 2"  (1.88 m), weight 113 kg, SpO2 100 %.    Medical Problem List and Plan: 1. 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             -patient may shower             -ELOS/Goals: 5-7 days, Mod I with PT/OT/SLP  Continue CIR- PT, OT and SLP  -expected dc 05/27/22 Mod I to sup  -He has ambulated 125 feet with RW and CGA 2.  Antithrombotics: -DVT/anticoagulation:  Mechanical: Sequential compression devices, below knee Bilateral lower extremities             -antiplatelet therapy: Plavix and aspirin for 3 weeks followed by Plavix alone 6/10 continue dual antiplatelet therapy 3. Pain Management: Tramadol, Tylenol as needed 6/10 pain is well controlled today with current regimen 4. Mood: LCSW to evaluate and provide emotional support             -antipsychotic agents: n/a 5. Neuropsych: This patient is capable of making decisions on his own behalf. 6. Skin/Wound Care: Routine skin care checks             --  Hidradenitis R groin: Local wound care; follow-up PCP             -On Vanc and zosyn for 48 hours, consider de-escalation to augemtin 6/10 Left groin hidradenitis, continue antibiotics vancomycin and Zosyn per pharmacy consider de-escalation to Augmentin we will further discuss with Dr. 8/10 MD tomorrow in addition to discussing with pharmacy as well.  Wound visualized today does have foul smell to it is tender to palpation. 6/11 Appears that HS is on both right and left groin area with extension to bilateral thighs and buttocks.  Area directly adjacent to scrotum  is raw with bloody drainage, smell less foul today than yesterday.  But due to opens wounds continue IV antibiotics today and overnight.  Primary team to decide to convert over to po antibiotics.  6/12- needs general surgery to look at him- to see if needs I&D- will decide about IV ABX once decided if needs I&D.   6/13- changed to Vibramycin and clindamycin topical gel- stopped IV ABX- should hopefully help nausea 6/17 DC IV doing well off IV abx 7. Fluids/Electrolytes/Nutrition: Routine ins and outs and follow-up chemistries 8: Hypertension: Norvasc 10 mg daily initiated post Cleviprex, Toprol-xl 25mg  -Has had a few mildly elevated BPs, continue to monitor trend    05/25/2022    8:35 PM 05/25/2022   12:52 PM 05/25/2022    6:01 AM  Vitals with BMI  Systolic 135 132 05/27/2022  Diastolic 90 90 95  Pulse 57 62 60  6/10 still with some elevated blood pressures, continue to trend with Toprol XL 25 mg. 6/12- BP very slightlyelevated- con't to monitor trend 6/13- BP running 141-160s/90s- if doesn't improve in next 24-48 hours, can increase BP meds. Is c/o light headedness and dizziness- however not clear if orthostatic hypotension- will check orthostatics.  6/17 Increase metoprolol to 50mg    -BP well controlled, follow 9: Hyperlipidemia: Continue atorvastatin 80 mg daily 10: Tobacco use: Cessation counseling -Nicotine patch 11: Alcohol abuse:  Cessation counseling -Thiamine supplementation, multivitamin, folic acid 6/11 No withdrawal symptoms today, cessation counseling 12: THC use: Advised regarding stroke risk 13: Obesity: Dietary recommendations 14. Nausea/vomiting  6/13- will start Zofran prn in addition to compazine for nausea- to help refractory N/V- thinking could be form IV ABX, but they stopped yesterday. Pt says LBM yesterday, but if nasuea doesn't improve, suggest checking KUB  -6/15 Improved, continue to monitor, had BM today  -6/17 Nausea improved, monitor 15. Anemia  -6/15 HGB increased to 12.1  -CBC tomorrow   LOS: 9 days A FACE TO FACE EVALUATION WAS PERFORMED  06-22-1968 05/25/2022, 10:21 PM

## 2022-05-25 NOTE — Progress Notes (Signed)
Physical Therapy Session Note  Patient Details  Name: James Lambert MRN: [id number] Date of Birth: [birth year]  Today's Date: 05/25/2022 PT Individual Time: 1102-1200 PT Individual Time Calculation (min): 58 min   Short Term Goals: Week 1:  PT Short Term Goal 1 (Week 1): Pt will ambulate with RW 218ft with CGA PT Short Term Goal 2 (Week 1): Pt will perform Berg balance scale to assess safety and function PT Short Term Goal 3 (Week 1): Pt will propell WC 112ft with supervision assist PT Short Term Goal 4 (Week 1): Pt will transfer to and from [facility] with supervision assist consistnetly  Skilled Therapeutic Interventions/Progress Updates: Pt presents supine in bed and agreeable to therapy.  Pt transfers all surfaces throughout session at supervision, although c/o pain to B knees of 9/10.  Pt amb w/ RW and supervision to distant supervision to BR and stood for continent bladder.  Pt stood at sink to wash hands.  Pt then stood for nursing to change dressings to wounds on bottom w/ increased drainage.  Pt stood and was able to doff pants and bloody brief and then step into clean brief and pants using RW.  Pt amb > 150' multiple trials throughout session w/ distant supervision and RW.  Pt negotiated ramped surface w/ supervision, 4 steps w/ 2 rails, step-to gait pattern and verbal cues for correct sequencing.  Pt reviewed home exercises of 2 x 20 abd/add, and marching,w/o weights but unable to perform knee flexion today.  Pt performed standing catching/throwing and bouncing ball to therapist w/o LOB.  Pt performing chest throws and overhead.  P returned to bed and transferred sit to supine w/ supervision.  Bed alarm on and all needs in reach.     Therapy Documentation Precautions:  Precautions Precautions: Fall Restrictions Weight Bearing Restrictions: No General:   Vital Signs:  Pain:9/10  Pain Assessment Pain Scale: 0-10 Pain Score: 8  Pain Type: Chronic pain Pain Location: Knee Pain  Orientation: Right Pain Descriptors / Indicators: Aching Pain Onset: With Activity Mobility:       Therapy/Group: Individual Therapy  Lucio Edward 05/25/2022, 12:49 PM

## 2022-05-26 LAB — BASIC METABOLIC PANEL
Anion gap: 14 (ref 5–15)
BUN: 19 mg/dL (ref 6–20)
CO2: 23 mmol/L (ref 22–32)
Calcium: 9.5 mg/dL (ref 8.9–10.3)
Chloride: 98 mmol/L (ref 98–111)
Creatinine, Ser: 1.16 mg/dL (ref 0.61–1.24)
GFR, Estimated: >60 mL/min (ref >60)
Glucose, Bld: 97 mg/dL (ref 70–99)
Potassium: 4.2 mmol/L (ref 3.5–5.1)
Sodium: 135 mmol/L (ref 135–145)

## 2022-05-26 LAB — CBC
HCT: 35.6 % — ABNORMAL LOW (ref 39.0–52.0)
Hemoglobin: 11.8 g/dL — ABNORMAL LOW (ref 13.0–17.0)
MCH: 30.4 pg (ref 26.0–34.0)
MCHC: 33.1 g/dL (ref 30.0–36.0)
MCV: 91.8 fL (ref 80.0–100.0)
Platelets: 381 10*3/uL (ref 150–400)
RBC: 3.88 MIL/uL — ABNORMAL LOW (ref 4.22–5.81)
RDW: 13.6 % (ref 11.5–15.5)
WBC: 5.9 10*3/uL (ref 4.0–10.5)
nRBC: 0 % (ref 0.0–0.2)

## 2022-05-26 MED ORDER — ORAL CARE MOUTH RINSE: 15.0000 mL | OROMUCOSAL | Status: DC | PRN

## 2022-05-26 NOTE — Progress Notes (Signed)
NT notified this nurse about pts brief having excessive amount of blood. Upon assessment, brief had a large amount of dark and bright red blood coming from wounds in the groin. Pt denies additional pain. Groin wounds cleansed and new dressing applied. Provider will be made aware.

## 2022-05-26 NOTE — Progress Notes (Signed)
Occupational Therapy Session Note  Patient Details  Name: [name] MRN: [id number] Date of Birth: 1966/02/10  Today's Date: 05/26/2022 OT Individual Time: 0700-0756 OT Individual Time Calculation (min): 56 min  and Today's Date: 05/26/2022 OT Missed Time: 34 Minutes Missed Time Reason: Patient fatigue   Short Term Goals: Week 2:  OT Short Term Goal 1 (Week 2): STG=LTG 2/2 ELOS (continue working towards LTG of supervision/mod I)  Skilled Therapeutic Interventions/Progress Updates:    Pt in bed upon arrival. Pt remarked that he didn't get to sleep unitl 5 AM due to leak in ceiling. Pt agreeable to "trying" therapy but really wanted to sleep/rest. OT intervention with focus on dressing, toileting, standing balance, funcitonal amb/transfers with RW, ongoing education, and safety awareness to prepare for discharge home tomorrow. All amb and tranfsers at supervision level. LB dressing with mod I. Toileting with supervision. Pt completed 7 mins on NuStep (level 3) with pt reporting that his Rt knee "felt better" afterwards. Bed mobility with supervision. Pt returned to bed. Bed alarm activated. All needs within reach. Pt missed 34 mins skilled OT services 2/2 fatigue. Will attempt to see again as schedule allows.   Therapy Documentation Precautions:  Precautions Precautions: Fall Restrictions Weight Bearing Restrictions: No General: General OT Amount of Missed Time: 34 Minutes Pain:  Pt c/o Rt knee pain (unrated); activity and PROM/AROM with relief noted   Therapy/Group: Individual Therapy  [name] 05/26/2022, 7:57 AM

## 2022-05-26 NOTE — Progress Notes (Signed)
Speech Language Pathology Discharge Summary  Patient Details  Name: James Lambert MRN: [id number] Date of Birth: 05/14/1966  Today's Date: 05/26/2022 SLP Individual Time: 1015-1058 SLP Individual Time Calculation (min): 43 min   Skilled Therapeutic Interventions: Skilled ST services focused on education and cognitive skills. SLP administered formal cognitive assessment SLUMS, pt scored 19/30 (n=>27) indicating continued deficits in primary short term recall, secondary problem solving and emergent awareness. SLP provided education for recall strategies with compensatory memory handout. All questions answered to satisfaction. Pt was left with call bell within reach and bed alarm set.     Patient has met 5 of 5 long term goals.  Patient to discharge at overall Supervision;Modified Independent level.  Reasons goals not met:     Clinical Impression/Discharge Summary:   Pt made good progress meeting 5 out 5 goals. Pt is able to express thoughts in conversation with extra time and mod I use of strategies in simple conversation. Pt is able complete basic problem solving task mod I, with mildly-complex to complex task such as IADLs requiring supervision A. Pt demonstrated improvement in error awareness and self corrections as well as memory strategies. Education was completed with brother whom supports he will be able to provide 24 hour supervision and assistance with IADLS. Pt was benefit from continued ST services to work on abstract thought, memory compensation strategies, higher level awareness and problem solving.   Care Partner:  Caregiver Able to Provide Assistance: Yes  Type of Caregiver Assistance: Physical;Cognitive  Recommendation:  Home Health SLP;24 hour supervision/assistance;Outpatient SLP  Rationale for SLP Follow Up: Maximize functional communication;Maximize cognitive function and independence;Maximize swallowing safety   Equipment: N/A   Reasons for discharge: Discharged from  hospital   Patient/Family Agrees with Progress Made and Goals Achieved: Yes    [name] 05/26/2022, 10:50 AM
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
## 2022-05-26 NOTE — Progress Notes (Signed)
Patient ID: James Lambert, male   DOB: 04/07/1966, 56 y.o.   MRN: [id number]  SW made contact with pt brother [name] to discuss d/c recommendations. SW shared about RW needed and process with ordering DME through charity. He would like this to be done. SW share dpt will be set up for medication assistance program. SW shared will provide resources for PCP to establish care. He asked if SW could call back as he was on his lunch break. SW encouraged him to call back when he is available. Confirms late pick up tomorrow around 3pm.   Cecile Sheerer, MSW, LCSWA Office: [phone] Cell: [phone] Fax: [phone]
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
## 2022-05-26 NOTE — Progress Notes (Signed)
Inpatient Rehabilitation Discharge Medication Review by a Pharmacist  A complete drug regimen review was completed for this patient to identify any potential clinically significant medication issues.  High Risk Drug Classes Is patient taking? Indication by Medication  Antipsychotic Yes   Anticoagulant Yes   Antibiotic Yes Doxycycline- wound infection Clindamycin gel- acne  Opioid Yes Tramadol for pain  Antiplatelet Yes Aspirin 81 mg daily (end date 06/06/22) and  Clopidogrel 75 mg daily for left ICA with TICI3 revascularization  Hypoglycemics/insulin No   Vasoactive Medication Yes Amlodipine, Metoprolol succinate for HTN  Chemotherapy No   Other Yes Atorvastatin for HLD Baclofen for pain/muscle spasms Pantoprazole for GERD Trazodone prn for sleep     Type of Medication Issue Identified Description of Issue Recommendation(s)  Drug Interaction(s) (clinically significant)     Duplicate Therapy     Allergy     No Medication Administration End Date     Incorrect Dose     Additional Drug Therapy Needed     Significant med changes from prior encounter (inform family/care partners about these prior to discharge).    Other       Clinically significant medication issues were identified that warrant physician communication and completion of prescribed/recommended actions by midnight of the next day:  No    Time spent performing this drug regimen review (minutes):  30   Shaeleigh Graw BS, PharmD, BCPS Clinical Pharmacist 05/26/2022 12:17 PM  Contact: [phone] after 3 PM  "Be curious, not judgmental..." -Debbora Dus

## 2022-05-26 NOTE — Progress Notes (Signed)
PROGRESS NOTE   Subjective/Complaints:   Pt reports LBM this AM- was in bathroom going currently.  Per OT, still bleeding from groin a little-    ROS:   Pt denies SOB, abd pain, CP, N/V/C/D, and vision changes   Objective:   No results found. Recent Labs    05/26/22 0615  WBC 5.9  HGB 11.8*  HCT 35.6*  PLT 381   Recent Labs    05/26/22 0615  NA 135  K 4.2  CL 98  CO2 23  GLUCOSE 97  BUN 19  CREATININE 1.16  CALCIUM 9.5    Intake/Output Summary (Last 24 hours) at 05/26/2022 1429 Last data filed at 05/26/2022 1338 Gross per 24 hour  Intake 600 ml  Output 700 ml  Net -100 ml        Physical Exam: Vital Signs Blood pressure (!) 134/107, pulse (!) 59, temperature 97.8 F (36.6 C), temperature source Oral, resp. rate 14, height 6\' 2"  (1.88 m), weight 113 kg, SpO2 100 %.     General: awake, alert, appropriate, sitting on toilet; NAD HENT: conjugate gaze; oropharynx moist CV: regular rate; no JVD Pulmonary: CTA B/L; no W/R/R- good air movement GI: soft, NT, ND, (+)BS Psychiatric: appropriate- but flat Neurological: alert  Skin: Clean in clean and intact with exception of Left and Right groin.  Presence of Hidradenitis Suppurativa adjacent to scrotum and on left thigh and right thigh - improving - mild bleeding Neuro: Cranial nerves II through XII intact.  Speech content is fluent but some mild hesitancy.  No aphasia present patient.  Patient able to recall 1 of 3 words with 3-minute wait between can follow commands and answer questions appropriately.  Sensation is intact to light touch for both bilateral and upper lower and lower extremities.  Strength 5 out of 5 in left upper and left lower extremity.  Strength 4+ out of 5 throughout the right upper extremity.  Strength 4 out of 5 proximal right lower extremity..  4+ out of 5 distal right lower extremity.  Normal coordination present Musculoskeletal: No  range of motion deficits noted.  Muscle bulk and tone is normal.  No swelling or joint tenderness noticed.   Assessment/Plan: 1. Functional deficits which require 3+ hours per day of interdisciplinary therapy in a comprehensive inpatient rehab setting. Physiatrist is providing close team supervision and 24 hour management of active medical problems listed below. Physiatrist and rehab team continue to assess barriers to discharge/monitor patient progress toward functional and medical goals  Care Tool:  Bathing  Bathing activity did not occur: Refused (already bathed with PT per report) Body parts bathed by patient: Right arm, Left arm, Chest, Abdomen, Front perineal area, Buttocks, Right upper leg, Left upper leg, Right lower leg, Face, Left lower leg   Body parts bathed by helper: Right lower leg, Left lower leg Body parts n/a: Right lower leg, Left lower leg   Bathing assist Assist Level: Supervision/Verbal cueing     Upper Body Dressing/Undressing Upper body dressing Upper body dressing/undressing activity did not occur (including orthotics): Refused (already donned shirt with PT) What is the patient wearing?: Pull over shirt    Upper body assist  Assist Level: Independent    Lower Body Dressing/Undressing Lower body dressing      What is the patient wearing?: Pants, Underwear/pull up     Lower body assist Assist for lower body dressing: Independent with assitive device     Toileting Toileting    Toileting assist Assist for toileting: Supervision/Verbal cueing     Transfers Chair/bed transfer  Transfers assist     Chair/bed transfer assist level: Independent with assistive device Chair/bed transfer assistive device: Arboriculturist assist      Assist level: Supervision/Verbal cueing Assistive device: Walker-rolling Max distance: >150   Walk 10 feet activity   Assist     Assist level: Supervision/Verbal cueing Assistive  device: Walker-rolling   Walk 50 feet activity   Assist Walk 50 feet with 2 turns activity did not occur: Refused  Assist level: Supervision/Verbal cueing Assistive device: Walker-rolling    Walk 150 feet activity   Assist Walk 150 feet activity did not occur: Refused  Assist level: Supervision/Verbal cueing Assistive device: Walker-rolling    Walk 10 feet on uneven surface  activity   Assist Walk 10 feet on uneven surfaces activity did not occur: Refused   Assist level: Supervision/Verbal cueing Assistive device: Development worker, international aid     Assist Is the patient using a wheelchair?: No Type of Wheelchair: Manual Wheelchair activity did not occur: Refused         Wheelchair 50 feet with 2 turns activity    Assist    Wheelchair 50 feet with 2 turns activity did not occur: Refused       Wheelchair 150 feet activity     Assist  Wheelchair 150 feet activity did not occur: Refused       Blood pressure (!) 134/107, pulse (!) 59, temperature 97.8 F (36.6 C), temperature source Oral, resp. rate 14, height 6\' 2"  (1.88 m), weight 113 kg, SpO2 100 %.    Medical Problem List and Plan: 1. 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             -patient may shower             -ELOS/Goals: 5-7 days, Mod I with PT/OT/SLP  Continue CIR- PT, OT and SLP  -expected dc 05/27/22 Mod I to sup  Con't CIR- PT, OT and SLP- d/c tomorrow 2.  Antithrombotics: -DVT/anticoagulation:  Mechanical: Sequential compression devices, below knee Bilateral lower extremities             -antiplatelet therapy: Plavix and aspirin for 3 weeks followed by Plavix alone 6/10 continue dual antiplatelet therapy 3. Pain Management: Tramadol, Tylenol as needed  6/19- not having pain- con't regimen 4. Mood: LCSW to evaluate and provide emotional support             -antipsychotic  agents: n/a 5. Neuropsych: This patient is capable of making decisions on his own behalf. 6. Skin/Wound Care: Routine skin care checks             -- Hidradenitis R groin: Local wound care; follow-up PCP             -On Vanc and zosyn for 48 hours, consider de-escalation to augemtin 6/10 Left groin hidradenitis, continue antibiotics vancomycin and Zosyn per pharmacy consider de-escalation to Augmentin we will further discuss with Dr. 8/10 MD tomorrow in addition to discussing with pharmacy as well.  Wound visualized today does have foul smell to it is tender to palpation. 6/11 Appears that HS is on both right and left groin area with extension to bilateral thighs and buttocks.  Area directly adjacent to scrotum is raw with bloody drainage, smell less foul today than yesterday.  But due to opens wounds continue IV antibiotics today and overnight.  Primary team to decide to convert over to po antibiotics.  6/12- needs general surgery to look at him- to see if needs I&D- will decide about IV ABX once decided if needs I&D.   6/13- changed to Vibramycin and clindamycin topical gel- stopped IV ABX- should hopefully help nausea 6/17 DC IV doing well off IV abx  6/19- WBC looking good- con't PO ABX- will need f/u with a PCP for this 7. Fluids/Electrolytes/Nutrition: Routine ins and outs and follow-up chemistries 8: Hypertension: Norvasc 10 mg daily initiated post Cleviprex, Toprol-xl 25mg  -Has had a few mildly elevated BPs, continue to monitor trend    05/26/2022    1:42 PM 05/26/2022    4:29 AM 05/25/2022    8:35 PM  Vitals with BMI  Systolic 134 141 05/27/2022  Diastolic 107 88 90  Pulse 59 59 57  6/10 still with some elevated blood pressures, continue to trend with Toprol XL 25 mg. 6/12- BP very slightlyelevated- con't to monitor trend 6/13- BP running 141-160s/90s- if doesn't improve in next 24-48 hours, can increase BP meds. Is c/o light headedness and dizziness- however not clear if orthostatic  hypotension- will check orthostatics.  6/17 Increase metoprolol to 50mg   6/19- BP DBP slightly elevated- but otherwise better controlled systolic- con't regimen 9: Hyperlipidemia: Continue atorvastatin 80 mg daily 10: Tobacco use: Cessation counseling -Nicotine patch 11: Alcohol abuse: Cessation counseling -Thiamine supplementation, multivitamin, folic acid 6/11 No withdrawal symptoms today, cessation counseling 12: THC use: Advised regarding stroke risk 13: Obesity: Dietary recommendations 14. Nausea/vomiting  6/13- will start Zofran prn in addition to compazine for nausea- to help refractory N/V- thinking could be form IV ABX, but they stopped yesterday. Pt says LBM yesterday, but if nasuea doesn't improve, suggest checking KUB  -6/15 Improved, continue to monitor, had BM today  -6/17 Nausea improved, monitor 15. Anemia  -6/15 HGB increased to 12.1  -CBC tomorrow  6/19- Hb 11.8- stable    LOS: 10 days A FACE TO FACE EVALUATION WAS PERFORMED  Piper Albro 05/26/2022, 2:29 PM

## 2022-05-26 NOTE — Progress Notes (Signed)
Physical Therapy Session Note  Patient Details  Name: James Lambert MRN: [id number] Date of Birth: 07-Oct-1966  Today's Date: 05/26/2022 PT Individual Time: 1601-0932 PT Individual Time Calculation (min): 30 min   Short Term Goals: Week 1:  PT Short Term Goal 1 (Week 1): Pt will ambulate with RW 255ft with CGA PT Short Term Goal 2 (Week 1): Pt will perform Berg balance scale to assess safety and function PT Short Term Goal 3 (Week 1): Pt will propell WC 121ft with supervision assist PT Short Term Goal 4 (Week 1): Pt will transfer to and from [facility] with supervision assist consistnetly     Skilled Therapeutic Interventions/Progress Updates: pt resting in bed.  He reported pain 7/10 bil knees; premedicated.  Gait with RW x 100'  x 2 ,supervision.  Patient demonstrates increased fall risk as noted by score of   42/56 on Berg Balance Scale.  (<36= high risk for falls, close to 100%; 37-45 significant >80%; 46-51 moderate >50%; 52-55 lower >25%).  Discussed his continued need to use RW for now to prevent falls.  At end of session, pt resting in bed with needs at hand and alarm set.     Therapy Documentation Precautions:  Precautions Precautions: Fall Restrictions Weight Bearing Restrictions: No     Balance: Standardized Balance Assessment Standardized Balance Assessment: Berg Balance Test Berg Balance Test Sit to Stand: Able to stand  independently using hands Standing Unsupported: Able to stand safely 2 minutes Sitting with Back Unsupported but Feet Supported on Floor or Stool: Able to sit safely and securely 2 minutes Stand to Sit: Controls descent by using hands Transfers: Able to transfer safely, definite need of hands Standing Unsupported with Eyes Closed: Able to stand 10 seconds with supervision Standing Ubsupported with Feet Together: Able to place feet together independently and stand 1 minute safely From Standing, Reach Forward with Outstretched Arm: Can reach forward >12  cm safely (5") From Standing Position, Pick up Object from Floor: Able to pick up shoe safely and easily From Standing Position, Turn to Look Behind Over each Shoulder: Looks behind from both sides and weight shifts well Turn 360 Degrees: Able to turn 360 degrees safely in 4 seconds or less Standing Unsupported, Alternately Place Feet on Step/Stool: Able to complete >2 steps/needs minimal assist Standing Unsupported, One Foot in Front: Able to take small step independently and hold 30 seconds Standing on One Leg: Unable to try or needs assist to prevent fall Total Score: 42      Therapy/Group: Individual Therapy  Maron Stanzione 05/26/2022, 4:54 PM

## 2022-05-27 ENCOUNTER — Other Ambulatory Visit (HOSPITAL_COMMUNITY): Payer: Self-pay

## 2022-05-27 MED ORDER — METOPROLOL SUCCINATE ER 50 MG PO TB24
50.0000 mg | ORAL_TABLET | Freq: Every day | ORAL | 0 refills | Status: DC
  Filled 2022-05-27: qty 30, 30d supply, fill #0

## 2022-05-27 MED ORDER — ACETAMINOPHEN 325 MG PO TABS: 325.0000 mg | ORAL_TABLET | ORAL | Status: DC | PRN

## 2022-05-27 MED ORDER — CLINDAMYCIN PHOSPHATE 1 % EX GEL
Freq: Two times a day (BID) | CUTANEOUS | 0 refills | Status: DC
  Filled 2022-05-27: qty 60, 30d supply, fill #0

## 2022-05-27 MED ORDER — FOLIC ACID 1 MG PO TABS
1.0000 mg | ORAL_TABLET | Freq: Every day | ORAL | 0 refills | Status: DC
  Filled 2022-05-27: qty 30, 30d supply, fill #0

## 2022-05-27 MED ORDER — ATORVASTATIN CALCIUM 80 MG PO TABS
80.0000 mg | ORAL_TABLET | Freq: Every day | ORAL | 0 refills | Status: DC
  Filled 2022-05-27: qty 30, 30d supply, fill #0

## 2022-05-27 MED ORDER — DOXYCYCLINE HYCLATE 100 MG PO TABS
100.0000 mg | ORAL_TABLET | Freq: Two times a day (BID) | ORAL | 0 refills | Status: DC
  Filled 2022-05-27: qty 60, 30d supply, fill #0

## 2022-05-27 MED ORDER — THIAMINE HCL 100 MG PO TABS
100.0000 mg | ORAL_TABLET | Freq: Every day | ORAL | 1 refills | Status: DC
  Filled 2022-05-27: qty 30, 30d supply, fill #0

## 2022-05-27 MED ORDER — CLOPIDOGREL BISULFATE 75 MG PO TABS
75.0000 mg | ORAL_TABLET | Freq: Every day | ORAL | 0 refills | Status: DC
  Filled 2022-05-27: qty 30, 30d supply, fill #0

## 2022-05-27 MED ORDER — BACLOFEN 5 MG PO TABS
5.0000 mg | ORAL_TABLET | Freq: Three times a day (TID) | ORAL | 0 refills | Status: DC
  Filled 2022-05-27: qty 90, 30d supply, fill #0

## 2022-05-27 MED ORDER — TRAMADOL HCL 50 MG PO TABS
50.0000 mg | ORAL_TABLET | Freq: Four times a day (QID) | ORAL | 0 refills | Status: DC | PRN
  Filled 2022-05-27: qty 30, 8d supply, fill #0

## 2022-05-27 MED ORDER — AMLODIPINE BESYLATE 10 MG PO TABS
10.0000 mg | ORAL_TABLET | Freq: Every day | ORAL | 0 refills | Status: DC
  Filled 2022-05-27: qty 30, 30d supply, fill #0

## 2022-05-27 MED ORDER — SENNOSIDES-DOCUSATE SODIUM 8.6-50 MG PO TABS
1.0000 | ORAL_TABLET | Freq: Two times a day (BID) | ORAL | 1 refills | Status: DC
  Filled 2022-05-27: qty 30, 15d supply, fill #0

## 2022-05-27 MED ORDER — NICOTINE 14 MG/24HR TD PT24
14.0000 mg | MEDICATED_PATCH | Freq: Every day | TRANSDERMAL | 0 refills | Status: DC
  Filled 2022-05-27: qty 28, 28d supply, fill #0

## 2022-05-27 MED ORDER — ADULT MULTIVITAMIN W/MINERALS CH
1.0000 | ORAL_TABLET | Freq: Every day | ORAL | 0 refills | Status: AC
  Filled 2022-05-27: qty 30, 30d supply, fill #0

## 2022-05-27 MED ORDER — ASPIRIN 81 MG PO TBEC
81.0000 mg | DELAYED_RELEASE_TABLET | Freq: Every day | ORAL | 0 refills | Status: AC
  Filled 2022-05-27: qty 4, 4d supply, fill #0

## 2022-05-27 MED ORDER — PANTOPRAZOLE SODIUM 40 MG PO TBEC
40.0000 mg | DELAYED_RELEASE_TABLET | Freq: Every day | ORAL | 0 refills | Status: DC
  Filled 2022-05-27: qty 30, 30d supply, fill #0

## 2022-05-27 NOTE — Progress Notes (Signed)
PROGRESS NOTE   Subjective/Complaints:   Pt reports no issues- ready for d/c today- asking about meds- explained will get them via [facility].  Also, given info on getting PCP- explained if doesn't go to see PCP, won't get refills of meds. Needs to see them  ROS:   Pt denies SOB, abd pain, CP, N/V/C/D, and vision changes   Objective:   No results found. Recent Labs    05/26/22 0615  WBC 5.9  HGB 11.8*  HCT 35.6*  PLT 381   Recent Labs    05/26/22 0615  NA 135  K 4.2  CL 98  CO2 23  GLUCOSE 97  BUN 19  CREATININE 1.16  CALCIUM 9.5    Intake/Output Summary (Last 24 hours) at 05/27/2022 0849 Last data filed at 05/27/2022 0724 Gross per 24 hour  Intake 1000 ml  Output 2400 ml  Net -1400 ml        Physical Exam: Vital Signs Blood pressure 137/89, pulse 63, temperature (!) 97.5 F (36.4 C), temperature source Oral, resp. rate 19, height 6\' 2"  (1.88 m), weight 113 kg, SpO2 100 %.      General: awake, alert, appropriate, NAD HENT: conjugate gaze; oropharynx moist CV: regular rate; no JVD Pulmonary: CTA B/L; no W/R/R- good air movement GI: soft, NT, ND, (+)BS Psychiatric: appropriate- flat Neurological: alert Skin: Clean in clean and intact with exception of Left and Right groin.  Presence of Hidradenitis Suppurativa adjacent to scrotum and on left thigh and right thigh - improving - mild bleeding Neuro: Cranial nerves II through XII intact.  Speech content is fluent but some mild hesitancy.  No aphasia present patient.  Patient able to recall 1 of 3 words with 3-minute wait between can follow commands and answer questions appropriately.  Sensation is intact to light touch for both bilateral and upper lower and lower extremities.  Strength 5 out of 5 in left upper and left lower extremity.  Strength 4+ out of 5 throughout the right upper extremity.  Strength 4 out of 5 proximal right lower extremity..  4+ out of  5 distal right lower extremity.  Normal coordination present Musculoskeletal: No range of motion deficits noted.  Muscle bulk and tone is normal.  No swelling or joint tenderness noticed.   Assessment/Plan: 1. Functional deficits which require 3+ hours per day of interdisciplinary therapy in a comprehensive inpatient rehab setting. Physiatrist is providing close team supervision and 24 hour management of active medical problems listed below. Physiatrist and rehab team continue to assess barriers to discharge/monitor patient progress toward functional and medical goals  Care Tool:  Bathing  Bathing activity did not occur: Refused (already bathed with PT per report) Body parts bathed by patient: Right arm, Left arm, Chest, Abdomen, Front perineal area, Buttocks, Right upper leg, Left upper leg, Right lower leg, Face, Left lower leg   Body parts bathed by helper: Right lower leg, Left lower leg Body parts n/a: Right lower leg, Left lower leg   Bathing assist Assist Level: Supervision/Verbal cueing     Upper Body Dressing/Undressing Upper body dressing Upper body dressing/undressing activity did not occur (including orthotics): Refused (already donned shirt with PT)  What is the patient wearing?: Pull over shirt    Upper body assist Assist Level: Independent    Lower Body Dressing/Undressing Lower body dressing      What is the patient wearing?: Pants, Underwear/pull up     Lower body assist Assist for lower body dressing: Independent with assitive device     Toileting Toileting    Toileting assist Assist for toileting: Supervision/Verbal cueing     Transfers Chair/bed transfer  Transfers assist     Chair/bed transfer assist level: Independent with assistive device Chair/bed transfer assistive device: Arboriculturist assist      Assist level: Supervision/Verbal cueing Assistive device: Walker-rolling Max distance: >150   Walk 10  feet activity   Assist     Assist level: Supervision/Verbal cueing Assistive device: Walker-rolling   Walk 50 feet activity   Assist Walk 50 feet with 2 turns activity did not occur: Refused  Assist level: Supervision/Verbal cueing Assistive device: Walker-rolling    Walk 150 feet activity   Assist Walk 150 feet activity did not occur: Refused  Assist level: Supervision/Verbal cueing Assistive device: Walker-rolling    Walk 10 feet on uneven surface  activity   Assist Walk 10 feet on uneven surfaces activity did not occur: Refused   Assist level: Supervision/Verbal cueing Assistive device: Development worker, international aid     Assist Is the patient using a wheelchair?: No Type of Wheelchair: Manual Wheelchair activity did not occur: Refused         Wheelchair 50 feet with 2 turns activity    Assist    Wheelchair 50 feet with 2 turns activity did not occur: Refused       Wheelchair 150 feet activity     Assist  Wheelchair 150 feet activity did not occur: Refused       Blood pressure 137/89, pulse 63, temperature (!) 97.5 F (36.4 C), temperature source Oral, resp. rate 19, height 6\' 2"  (1.88 m), weight 113 kg, SpO2 100 %.    Medical Problem List and Plan: 1. 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             -patient may shower             -ELOS/Goals: 5-7 days, Mod I with PT/OT/SLP  Continue CIR- PT, OT and SLP  -expected dc 05/27/22 Mod I to sup  D/c today- will need f/u with me or Dr 05/29/22 and also needs an appointment with a PCP.  2.  Antithrombotics: -DVT/anticoagulation:  Mechanical: Sequential compression devices, below knee Bilateral lower extremities             -antiplatelet therapy: Plavix and aspirin for 3 weeks followed by Plavix alone 6/10 continue dual antiplatelet therapy 3. Pain Management: Tramadol, Tylenol as  needed  6/19- not having pain- con't regimen 4. Mood: LCSW to evaluate and provide emotional support             -antipsychotic agents: n/a 5. Neuropsych: This patient is capable of making decisions on his own behalf. 6. Skin/Wound Care: Routine skin care checks             -- Hidradenitis R groin: Local wound care; follow-up PCP             -On Vanc and zosyn for 48 hours, consider de-escalation to augemtin 6/10 Left groin hidradenitis, continue antibiotics vancomycin and Zosyn  per pharmacy consider de-escalation to Augmentin we will further discuss with Dr. Riley Kill MD tomorrow in addition to discussing with pharmacy as well.  Wound visualized today does have foul smell to it is tender to palpation. 6/11 Appears that HS is on both right and left groin area with extension to bilateral thighs and buttocks.  Area directly adjacent to scrotum is raw with bloody drainage, smell less foul today than yesterday.  But due to opens wounds continue IV antibiotics today and overnight.  Primary team to decide to convert over to po antibiotics.  6/12- needs general surgery to look at him- to see if needs I&D- will decide about IV ABX once decided if needs I&D.   6/13- changed to Vibramycin and clindamycin topical gel- stopped IV ABX- should hopefully help nausea 6/17 DC IV doing well off IV abx  6/19- WBC looking good- con't PO ABX- will need f/u with a PCP for this  6/20- will need to go home on Doxycycline tabs BID and Clindamycin gel 7. Fluids/Electrolytes/Nutrition: Routine ins and outs and follow-up chemistries 8: Hypertension: Norvasc 10 mg daily initiated post Cleviprex, Toprol-xl 25mg  -Has had a few mildly elevated BPs, continue to monitor trend    05/26/2022    7:48 PM 05/26/2022    1:42 PM 05/26/2022    4:29 AM  Vitals with BMI  Systolic 137 134 05/28/2022  Diastolic 89 107 88  Pulse 63 59 59  6/10 still with some elevated blood pressures, continue to trend with Toprol XL 25 mg. 6/12- BP very  slightlyelevated- con't to monitor trend 6/13- BP running 141-160s/90s- if doesn't improve in next 24-48 hours, can increase BP meds. Is c/o light headedness and dizziness- however not clear if orthostatic hypotension- will check orthostatics.  6/17 Increase metoprolol to 50mg   6/19- BP DBP slightly elevated- but otherwise better controlled systolic- con't regimen 9: Hyperlipidemia: Continue atorvastatin 80 mg daily 10: Tobacco use: Cessation counseling -Nicotine patch 11: Alcohol abuse: Cessation counseling -Thiamine supplementation, multivitamin, folic acid 6/11 No withdrawal symptoms today, cessation counseling 12: THC use: Advised regarding stroke risk 13: Obesity: Dietary recommendations 14. Nausea/vomiting  6/13- will start Zofran prn in addition to compazine for nausea- to help refractory N/V- thinking could be form IV ABX, but they stopped yesterday. Pt says LBM yesterday, but if nasuea doesn't improve, suggest checking KUB  -6/15 Improved, continue to monitor, had BM today  -6/17 Nausea improved, monitor 15. Anemia  -6/15 HGB increased to 12.1  -CBC tomorrow  6/19- Hb 11.8- stable    LOS: 11 days A FACE TO FACE EVALUATION WAS PERFORMED  James Lambert 05/27/2022, 8:49 AM

## 2022-05-27 NOTE — Discharge Instructions (Addendum)
Inpatient Rehab Discharge Instructions  James Lambert Discharge date and time: 05/27/2022   Activities/Precautions/ Functional Status: Activity: no lifting, driving, or strenuous exercise until cleared by MD Diet: cardiac diet Wound Care: wash/shower daily and apply antibiotic gel to both groin areas Functional status:  ___ No restrictions     ___ Walk up steps independently ___ 24/7 supervision/assistance   ___ Walk up steps with assistance _x__ Intermittent supervision/assistance  ___ Bathe/dress independently ___ Walk with walker     ___ Bathe/dress with assistance ___ Walk Independently    ___ Shower independently ___ Walk with assistance    _x__ Shower with assistance _x__ No alcohol     ___ Return to work/school ________    COMMUNITY REFERRALS UPON DISCHARGE:    Home Health:   PT     OT     ST                    Agency: [facility]  (charity)  Phone:  [phone]   Medical Equipment/Items Ordered: rolling walker                                                 Agency/Supplier: [employer] [phone] (charity)  GENERAL COMMUNITY RESOURCES FOR PATIENT/FAMILY: Please refer to community resources provided to establish PCP-[facility] and [facility].   Special Instructions:  No driving, alcohol consumption or tobacco use.   STROKE/TIA DISCHARGE INSTRUCTIONS SMOKING Cigarette smoking nearly doubles your risk of having a stroke & is the single most alterable risk factor  If you smoke or have smoked in the last 12 months, you are advised to quit smoking for your health. Most of the excess cardiovascular risk related to smoking disappears within a year of stopping. Ask you doctor about anti-smoking medications  Quit Line: 1-800-QUIT NOW Free Smoking Cessation Classes [phone]  CHOLESTEROL Know your levels; limit fat & cholesterol in your diet  Lipid Panel     Component Value Date/Time   CHOL 140 05/11/2022 0249   TRIG 87 05/11/2022 0249   HDL  32 (L) 05/11/2022 0249   CHOLHDL 4.4 05/11/2022 0249   VLDL 17 05/11/2022 0249   LDLCALC 91 05/11/2022 0249     Many patients benefit from treatment even if their cholesterol is at goal. Goal: Total Cholesterol (CHOL) less than 160 Goal:  Triglycerides (TRIG) less than 150 Goal:  HDL greater than 40 Goal:  LDL (LDLCALC) less than 100   BLOOD PRESSURE American Stroke Association blood pressure target is less that 120/80 mm/Hg  Your discharge blood pressure is:  BP: 137/89 Monitor your blood pressure Limit your salt and alcohol intake Many individuals will require more than one medication for high blood pressure  DIABETES (A1c is a blood sugar average for last 3 months) Goal HGBA1c is under 7% (HBGA1c is blood sugar average for last 3 months)  Diabetes: No known diagnosis of diabetes    Lab Results  Component Value Date   HGBA1C 5.3 03/19/2022    Your HGBA1c can be lowered with medications, healthy diet, and exercise. Check your blood sugar as directed by your physician Call your physician if you experience unexplained or low blood sugars.  PHYSICAL ACTIVITY/REHABILITATION Goal is 30 minutes at least 4 days per week  Activity: Increase activity slowly, Therapies: Physical Therapy: Home Health Return to work:  when cleared by MD Activity decreases your risk of heart attack and stroke and makes your heart stronger.  It helps control your weight and blood pressure; helps you relax and can improve your mood. Participate in a regular exercise program. Talk with your doctor about the best form of exercise for you (dancing, walking, swimming, cycling).  DIET/WEIGHT Goal is to maintain a healthy weight  Your discharge diet is:  Diet Order             Diet Heart Room service appropriate? Yes with Assist; Fluid consistency: Thin  Diet effective now                  thin liquids Your height is:  Height: 6\' 2"  (188 cm) Your current weight is: Weight: 113 kg Your Body Mass Index (BMI)  is:  BMI (Calculated): 31.97 Following the type of diet specifically designed for you will help prevent another stroke. Your goal weight range is:   Your goal Body Mass Index (BMI) is 19-24. Healthy food habits can help reduce 3 risk factors for stroke:  High cholesterol, hypertension, and excess weight.  RESOURCES Stroke/Support Group:  Call [phone]   STROKE EDUCATION PROVIDED/REVIEWED AND GIVEN TO PATIENT Stroke warning signs and symptoms How to activate emergency medical system (call 911). Medications prescribed at discharge. Need for follow-up after discharge. Personal risk factors for stroke. Pneumonia vaccine given: No Flu vaccine given: No My questions have been answered, the writing is legible, and I understand these instructions.  I will adhere to these goals & educational materials that have been provided to me after my discharge from the hospital.      My questions have been answered and I understand these instructions. I will adhere to these goals and the provided educational materials after my discharge from the hospital.  Patient/Caregiver Signature _______________________________ Date __________  Clinician Signature _______________________________________ Date __________  Please bring this form and your medication list with you to all your follow-up doctor's appointments.

## 2022-05-27 NOTE — Progress Notes (Signed)
Patient ID: James Lambert, male   DOB: 1966-07-07, 56 y.o.   MRN: [id number]  SW returned phone call to pt brother [name] who reported he took the day off form work to try and get here to pick up his brother. SW reviewed discharge with regard to establishing PCP and pt being set up for [facility]. SW discussed if he had received follow-up about the status of pt Medicaid application. He indicated he had not. Chart review in auth notes indicated Medicaid application submitted but pending reason is unknown. SW informed brother will ask for staff from [facility] to follow-up.   SW submitted charity HHPT/OT/SLP referral to [facility] and waiting on follow-up.  *HH referral accepted.   SW met with pt and pt brother [name] in room to discuss above in regards to discharge recommendations.No questions/concerns reported.   Loralee Pacas, MSW, [place] Office: [phone] Cell: [phone] Fax: [phone]

## 2022-05-28 NOTE — Progress Notes (Signed)
Inpatient Rehabilitation Care Coordinator Discharge Note   Patient Details  Name: PEDRO WHITERS MRN: [id number] Date of Birth: 1966/07/25   Discharge location: D/c to home  Length of Stay: 10 days  Discharge activity level: Supervision  Home/community participation: Limited  Patient response YT:KPTWSF Literacy - How often do you need to have someone help you when you read instructions, pamphlets, or other written material from your doctor or pharmacy?: Sometimes  Patient response KC:LEXNTZ Isolation - How often do you feel lonely or isolated from those around you?: Never  Services provided included: MD, RD, PT, OT, SLP, RN, TR, Neuropsych, SW, Pharmacy, CM  Financial Services:  Field seismologist Utilized: Other (Comment) (Uninisured)    Choices offered to/list presented to: N/A  Follow-up services arranged:  Home Health Home Health Agency: [facility] with [facility] for HHPT/OT/SLP    Patient response to transportation need: Is the patient able to respond to transportation needs?: Yes In the past 12 months, has lack of transportation kept you from medical appointments or from getting medications?: No In the past 12 months, has lack of transportation kept you from meetings, work, or from getting things needed for daily living?: No    Comments (or additional information):  Patient/Family verbalized understanding of follow-up arrangements:  Yes  Individual responsible for coordination of the follow-up plan: contact pt brother [name]  Confirmed correct DME delivered: Gretchen Short 05/28/2022    Gretchen Short

## 2022-05-29 ENCOUNTER — Other Ambulatory Visit: Payer: Self-pay

## 2022-06-09 ENCOUNTER — Encounter: Payer: Self-pay | Admitting: Emergency Medicine

## 2022-06-09 ENCOUNTER — Other Ambulatory Visit: Payer: Self-pay

## 2022-06-09 ENCOUNTER — Inpatient Hospital Stay
Admission: EM | Admit: 2022-06-09 | Discharge: 2022-06-12 | DRG: 313 | Disposition: A | Discharge status: Home / Self Care | Payer: Medicaid Other | Attending: Internal Medicine | Admitting: Internal Medicine

## 2022-06-09 ENCOUNTER — Emergency Department: Payer: Medicaid Other

## 2022-06-09 DIAGNOSIS — R61 Generalized hyperhidrosis: Secondary | ICD-10-CM | POA: Diagnosis present

## 2022-06-09 DIAGNOSIS — J449 Chronic obstructive pulmonary disease, unspecified: Secondary | ICD-10-CM | POA: Diagnosis present

## 2022-06-09 DIAGNOSIS — Z7982 Long term (current) use of aspirin: Secondary | ICD-10-CM | POA: Diagnosis not present

## 2022-06-09 DIAGNOSIS — I69341 Monoplegia of lower limb following cerebral infarction affecting right dominant side: Secondary | ICD-10-CM | POA: Diagnosis not present

## 2022-06-09 DIAGNOSIS — R112 Nausea with vomiting, unspecified: Secondary | ICD-10-CM | POA: Diagnosis present

## 2022-06-09 DIAGNOSIS — Z20822 Contact with and (suspected) exposure to covid-19: Secondary | ICD-10-CM | POA: Diagnosis present

## 2022-06-09 DIAGNOSIS — R079 Chest pain, unspecified: Secondary | ICD-10-CM | POA: Diagnosis present

## 2022-06-09 DIAGNOSIS — I10 Essential (primary) hypertension: Secondary | ICD-10-CM | POA: Diagnosis present

## 2022-06-09 DIAGNOSIS — I2510 Atherosclerotic heart disease of native coronary artery without angina pectoris: Secondary | ICD-10-CM | POA: Diagnosis present

## 2022-06-09 DIAGNOSIS — Z7902 Long term (current) use of antithrombotics/antiplatelets: Secondary | ICD-10-CM | POA: Diagnosis not present

## 2022-06-09 DIAGNOSIS — D649 Anemia, unspecified: Secondary | ICD-10-CM | POA: Diagnosis present

## 2022-06-09 DIAGNOSIS — R0789 Other chest pain: Principal | ICD-10-CM | POA: Diagnosis present

## 2022-06-09 DIAGNOSIS — F17210 Nicotine dependence, cigarettes, uncomplicated: Secondary | ICD-10-CM | POA: Diagnosis present

## 2022-06-09 DIAGNOSIS — L732 Hidradenitis suppurativa: Secondary | ICD-10-CM | POA: Diagnosis not present

## 2022-06-09 LAB — BASIC METABOLIC PANEL
Anion gap: 9 (ref 5–15)
BUN: 7 mg/dL (ref 6–20)
CO2: 24 mmol/L (ref 22–32)
Calcium: 8.6 mg/dL — ABNORMAL LOW (ref 8.9–10.3)
Chloride: 105 mmol/L (ref 98–111)
Creatinine, Ser: 1.13 mg/dL (ref 0.61–1.24)
GFR, Estimated: >60 mL/min (ref >60)
Glucose, Bld: 117 mg/dL — ABNORMAL HIGH (ref 70–99)
Potassium: 3.5 mmol/L (ref 3.5–5.1)
Sodium: 138 mmol/L (ref 135–145)

## 2022-06-09 LAB — HEPATIC FUNCTION PANEL
ALT: 12 U/L (ref 0–44)
AST: 16 U/L (ref 15–41)
Albumin: 3 g/dL — ABNORMAL LOW (ref 3.5–5.0)
Alkaline Phosphatase: 93 U/L (ref 38–126)
Bilirubin, Direct: <0.1 mg/dL (ref 0.0–0.2)
Total Bilirubin: 0.4 mg/dL (ref 0.3–1.2)
Total Protein: 7.4 g/dL (ref 6.5–8.1)

## 2022-06-09 LAB — CBC
HCT: 32.7 % — ABNORMAL LOW (ref 39.0–52.0)
Hemoglobin: 10.8 g/dL — ABNORMAL LOW (ref 13.0–17.0)
MCH: 29.8 pg (ref 26.0–34.0)
MCHC: 33 g/dL (ref 30.0–36.0)
MCV: 90.3 fL (ref 80.0–100.0)
Platelets: 419 10*3/uL — ABNORMAL HIGH (ref 150–400)
RBC: 3.62 MIL/uL — ABNORMAL LOW (ref 4.22–5.81)
RDW: 13.2 % (ref 11.5–15.5)
WBC: 7.3 10*3/uL (ref 4.0–10.5)
nRBC: 0 % (ref 0.0–0.2)

## 2022-06-09 LAB — LIPASE, BLOOD: Lipase: 29 U/L (ref 11–51)

## 2022-06-09 LAB — SARS CORONAVIRUS 2 BY RT PCR: SARS Coronavirus 2 by RT PCR: NEGATIVE

## 2022-06-09 LAB — TROPONIN I (HIGH SENSITIVITY)
Troponin I (High Sensitivity): 4 ng/L (ref <18)
Troponin I (High Sensitivity): 3 ng/L (ref <18)

## 2022-06-09 MED ORDER — BACLOFEN 10 MG PO TABS
5.0000 mg | ORAL_TABLET | Freq: Three times a day (TID) | ORAL | Status: DC
  Administered 2022-06-10 – 2022-06-12 (×5): 5 mg via ORAL
  Filled 2022-06-09 (×9): qty 0.5

## 2022-06-09 MED ORDER — AMLODIPINE BESYLATE 10 MG PO TABS
10.0000 mg | ORAL_TABLET | Freq: Every day | ORAL | Status: DC
  Administered 2022-06-10 – 2022-06-12 (×3): 10 mg via ORAL
  Filled 2022-06-09 (×2): qty 1
  Filled 2022-06-09: qty 2

## 2022-06-09 MED ORDER — ADULT MULTIVITAMIN W/MINERALS CH
1.0000 | ORAL_TABLET | Freq: Every day | ORAL | Status: DC
Start: 2022-06-10 — End: 2022-06-12
  Administered 2022-06-10 – 2022-06-12 (×3): 1 via ORAL
  Filled 2022-06-09 (×3): qty 1

## 2022-06-09 MED ORDER — TRAMADOL HCL 50 MG PO TABS: 50.0000 mg | ORAL_TABLET | Freq: Four times a day (QID) | ORAL | Status: DC | PRN

## 2022-06-09 MED ORDER — CLINDAMYCIN PHOSPHATE 1 % EX GEL
Freq: Two times a day (BID) | CUTANEOUS | Status: DC
Start: 2022-06-10 — End: 2022-06-10

## 2022-06-09 MED ORDER — ATORVASTATIN CALCIUM 80 MG PO TABS
80.0000 mg | ORAL_TABLET | Freq: Every day | ORAL | Status: DC
  Administered 2022-06-10 – 2022-06-12 (×3): 80 mg via ORAL
  Filled 2022-06-09: qty 4
  Filled 2022-06-09 (×2): qty 1

## 2022-06-09 MED ORDER — SODIUM CHLORIDE 0.9 % IV SOLN
100.0000 mg | Freq: Once | INTRAVENOUS | Status: AC
  Administered 2022-06-09: 100 mg via INTRAVENOUS
  Filled 2022-06-09: qty 100

## 2022-06-09 MED ORDER — THIAMINE HCL 100 MG PO TABS
100.0000 mg | ORAL_TABLET | Freq: Every day | ORAL | Status: DC
  Administered 2022-06-10 – 2022-06-12 (×3): 100 mg via ORAL
  Filled 2022-06-09 (×3): qty 1

## 2022-06-09 MED ORDER — HEPARIN SODIUM (PORCINE) 5000 UNIT/ML IJ SOLN
5000.0000 [IU] | Freq: Three times a day (TID) | INTRAMUSCULAR | Status: DC
  Administered 2022-06-10 – 2022-06-12 (×8): 5000 [IU] via SUBCUTANEOUS
  Filled 2022-06-09 (×8): qty 1

## 2022-06-09 MED ORDER — METOPROLOL SUCCINATE ER 50 MG PO TB24
50.0000 mg | ORAL_TABLET | Freq: Every day | ORAL | Status: DC
  Administered 2022-06-10 – 2022-06-12 (×3): 50 mg via ORAL
  Filled 2022-06-09 (×3): qty 1

## 2022-06-09 MED ORDER — IOHEXOL 350 MG/ML SOLN
100.0000 mL | Freq: Once | INTRAVENOUS | Status: AC | PRN
  Administered 2022-06-09: 100 mL via INTRAVENOUS

## 2022-06-09 MED ORDER — DOXYCYCLINE HYCLATE 100 MG PO TABS
100.0000 mg | ORAL_TABLET | Freq: Two times a day (BID) | ORAL | Status: DC
  Administered 2022-06-10 – 2022-06-12 (×5): 100 mg via ORAL
  Filled 2022-06-09 (×5): qty 1

## 2022-06-09 MED ORDER — ASPIRIN 81 MG PO TBEC
81.0000 mg | DELAYED_RELEASE_TABLET | Freq: Every day | ORAL | Status: DC
  Administered 2022-06-10 – 2022-06-12 (×3): 81 mg via ORAL
  Filled 2022-06-09 (×3): qty 1

## 2022-06-09 MED ORDER — ACETAMINOPHEN 325 MG PO TABS
650.0000 mg | ORAL_TABLET | Freq: Three times a day (TID) | ORAL | Status: DC
Start: 2022-06-10 — End: 2022-06-12
  Administered 2022-06-10 – 2022-06-12 (×7): 650 mg via ORAL
  Filled 2022-06-09 (×7): qty 2

## 2022-06-09 MED ORDER — SENNOSIDES-DOCUSATE SODIUM 8.6-50 MG PO TABS
1.0000 | ORAL_TABLET | Freq: Two times a day (BID) | ORAL | Status: DC
Start: 2022-06-10 — End: 2022-06-12
  Administered 2022-06-10 – 2022-06-11 (×3): 1 via ORAL
  Filled 2022-06-09 (×6): qty 1

## 2022-06-09 MED ORDER — FOLIC ACID 1 MG PO TABS
1.0000 mg | ORAL_TABLET | Freq: Every day | ORAL | Status: DC
  Administered 2022-06-10 – 2022-06-12 (×3): 1 mg via ORAL
  Filled 2022-06-09 (×3): qty 1

## 2022-06-09 MED ORDER — CLOPIDOGREL BISULFATE 75 MG PO TABS
75.0000 mg | ORAL_TABLET | Freq: Every day | ORAL | Status: DC
  Administered 2022-06-10 – 2022-06-12 (×3): 75 mg via ORAL
  Filled 2022-06-09 (×3): qty 1

## 2022-06-09 MED ORDER — PANTOPRAZOLE SODIUM 40 MG PO TBEC
40.0000 mg | DELAYED_RELEASE_TABLET | Freq: Every day | ORAL | Status: DC
  Administered 2022-06-10 – 2022-06-11 (×3): 40 mg via ORAL
  Filled 2022-06-09 (×3): qty 1

## 2022-06-09 NOTE — H&P (Addendum)
History and Physical    James Lambert:[id number] DOB: 1966/05/29 DOA: 06/09/2022  PCP: Pcp, No  Patient coming from: chest pain   I have personally briefly reviewed patient's old medical records in [facility]  Chief Complaint: substernal chest pain x 3 days   HPI: James Lambert is a 56 y.o. 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 Of note with residual weakness in right leg.  Patient now presents to ED with complaints of 3 days of substernal chest pain, described as sharp pain, lasting for few minutes. Patient states that the pain episodes are around 5 per day. He notes no trauma or heavy lifting. He notes pain not made worse with exertion. However is pain worse with deep breathing and twisting. But does endorse associated diaphoresis, and presyncope.  He denies fever/chills/abdominal pain but does endorse nausea and episode of vomiting. ON further ros patient does endorse flare of HS with draining boils between his gluteal folds as well as groin.He again denies any fever or chills.  ED Course:  Patient noted to have stable vitals initial ischemic evaluation unrevealing due to known vascular history and history with come typical symptoms patient is admitted for further cardiac evaluation.   Afeb, bp 122/85, hr 65, rr 20 , sat 100% on ra  Wbc: 7.3, hgb 10.8 (11.8),plt 419 BMP: NA 138, K 3.5,  Gly 117, co2 24, cr 1.13 ce 4 Review of Systems: As per HPI otherwise 10 point review of systems negative.   Past Medical History:  Diagnosis Date   Asthma    COPD (chronic obstructive pulmonary disease) (HCC)    Hidradenitis suppurativa    diagnosed in [facility] ED based on history and physical exam   Hypertension     Past Surgical History:  Procedure Laterality Date   IR CT  HEAD LTD  05/10/2022   IR FLUORO GUIDED NEEDLE PLC ASPIRATION/INJECTION LOC  03/20/2022   IR PERCUTANEOUS ART THROMBECTOMY/INFUSION INTRACRANIAL INC DIAG ANGIO  05/10/2022   IR US GUIDE VASC ACCESS RIGHT  05/10/2022   RADIOLOGY WITH ANESTHESIA N/A 05/10/2022   Procedure: IR WITH ANESTHESIA;  Surgeon: Julieanne Cotton, MD;  Location: [facility] OR;  Service: Radiology;  Laterality: N/A;   TEE WITHOUT CARDIOVERSION N/A 03/11/2022   Procedure: TRANSESOPHAGEAL ECHOCARDIOGRAM (TEE);  Surgeon: Lamar Blinks, MD;  Location: [facility] ORS;  Service: Cardiovascular;  Laterality: N/A;     reports that he has been smoking cigarettes. He has been smoking an average of .5 packs per day. He has never used smokeless tobacco. He reports current alcohol use. He reports current drug use. Drug: Marijuana.  No Known Allergies  History reviewed. No pertinent family history.  Prior to Admission medications   Medication Sig Start Date End Date Taking? Authorizing Provider  acetaminophen (TYLENOL) 325 MG tablet Take 1-2 tablets (325-650 mg total) by mouth every 4 (four) hours as needed for mild pain. 05/27/22  Yes Setzer, Lynnell Jude, PA-C  amLODipine (NORVASC) 10 MG tablet Take 1 tablet (10 mg total) by mouth once daily. 05/27/22  Yes Setzer, Lynnell Jude, PA-C  atorvastatin (LIPITOR) 80 MG tablet Take 1 tablet (80 mg total) by mouth once daily. 05/27/22  Yes Setzer, Lynnell Jude, PA-C  Baclofen 5 MG TABS Take 1 tablet (  5 mg) by mouth three times daily. 05/27/22  Yes Setzer, Lynnell Jude, PA-C  clindamycin (CLINDAGEL) 1 % gel Apply topically 2 (two) times daily. 05/27/22  Yes Setzer, Lynnell Jude, PA-C  clopidogrel (PLAVIX) 75 MG tablet Take 1 tablet (75 mg total) by mouth daily. 05/27/22  Yes Setzer, Lynnell Jude, PA-C  doxycycline (VIBRA-TABS) 100 MG tablet Take 1 tablet (100 mg total) by mouth every 12 (twelve) hours. 05/27/22  Yes Setzer, Lynnell Jude, PA-C  folic acid (FOLVITE) 1 MG tablet Take 1 tablet (1 mg total) by mouth daily. 05/27/22  Yes Setzer, Lynnell Jude, PA-C  metoprolol succinate (TOPROL-XL) 50 MG 24 hr tablet Take 1 tablet (50 mg total) by mouth daily. Take with or immediately following a meal. 05/28/22  Yes Setzer, Lynnell Jude, PA-C  Multiple Vitamin (MULTIVITAMIN WITH MINERALS) TABS tablet Take 1 tablet by mouth daily. 05/27/22  Yes Setzer, Lynnell Jude, PA-C  nicotine (NICODERM CQ - DOSED IN MG/24 HOURS) 14 mg/24hr patch Place 1 patch (14 mg total) onto the skin daily. 05/28/22  Yes Setzer, Lynnell Jude, PA-C  pantoprazole (PROTONIX) 40 MG tablet Take 1 tablet (40 mg total) by mouth at bedtime. 05/27/22  Yes Setzer, Lynnell Jude, PA-C  senna-docusate (SENOKOT-S) 8.6-50 MG tablet Take 1 tablet by mouth 2 (two) times daily. 05/27/22  Yes Setzer, Lynnell Jude, PA-C  thiamine 100 MG tablet Take 1 tablet (100 mg total) by mouth daily. 05/27/22  Yes Setzer, Lynnell Jude, PA-C  traMADol (ULTRAM) 50 MG tablet Take 1 tablet (50 mg total) by mouth every 6 (six) hours as needed. 05/27/22  Yes Milinda Antis, PA-C    Physical Exam: Vitals:   06/09/22 1501 06/09/22 1503 06/09/22 1748 06/09/22 1941  BP:  122/85 126/80 (!) 151/110  Pulse:  65 68 62  Resp:  20 18 18   Temp:  98.5 F (36.9 C)    SpO2:  100% 100% 99%  Weight: 113.4 kg     Height: 6\' 2"  (1.88 m)       Vitals:   06/09/22 1501 06/09/22 1503 06/09/22 1748 06/09/22 1941  BP:  122/85 126/80 (!) 151/110  Pulse:  65 68 62  Resp:  20 18 18   Temp:  98.5 F (36.9 C)    SpO2:  100% 100% 99%  Weight: 113.4 kg     Height: 6\' 2"  (1.88 m)      Constitutional: NAD, calm, comfortable Eyes: PERRL, lids and conjunctivae normal ENMT: Mucous membranes are moist. Posterior pharynx clear of any exudate or lesions.Normal dentition.  Neck: normal, supple, no masses, no thyromegaly Respiratory: clear to auscultation bilaterally, no wheezing, no crackles. Normal respiratory effort. No accessory muscle use.  Cardiovascular: Regular rate and rhythm, no murmurs / rubs / gallops. No extremity edema. 2+ pedal pulses.  Abdomen: no  tenderness, no masses palpated. No hepatosplenomegaly. Bowel sounds positive.  Musculoskeletal: no clubbing / cyanosis. No joint deformity upper and lower extremities. Good ROM, no contractures. Normal muscle tone.  Skin: draining  boils gluteal folds and b/l groin, draining serous sanguinous fluid Neurologic: CN 2-12 grossly intact. Sensation intact,  Strength 5/5 in all 4. Except right lower ext 4/5 Psychiatric: Normal judgment and insight. Alert and oriented x 3. Normal mood.    Labs on Admission: I have personally reviewed following labs and imaging studies  CBC: Recent Labs  Lab 06/09/22 1508  WBC 7.3  HGB 10.8*  HCT 32.7*  MCV 90.3  PLT 419*   Basic Metabolic Panel: Recent Labs  Lab 06/09/22 1508  NA 138  K 3.5  CL 105  CO2 24  GLUCOSE 117*  BUN 7  CREATININE 1.13  CALCIUM 8.6*   GFR: Estimated Creatinine Clearance: 100.1 mL/min (by C-G formula based on SCr of 1.13 mg/dL). Liver Function Tests: Recent Labs  Lab 06/09/22 1813  AST 16  ALT 12  ALKPHOS 93  BILITOT 0.4  PROT 7.4  ALBUMIN 3.0*   Recent Labs  Lab 06/09/22 1813  LIPASE 29   No results for input(s): "AMMONIA" in the last 168 hours. Coagulation Profile: No results for input(s): "INR", "PROTIME" in the last 168 hours. Cardiac Enzymes: No results for input(s): "CKTOTAL", "CKMB", "CKMBINDEX", "TROPONINI" in the last 168 hours. BNP (last 3 results) No results for input(s): "PROBNP" in the last 8760 hours. HbA1C: No results for input(s): "HGBA1C" in the last 72 hours. CBG: No results for input(s): "GLUCAP" in the last 168 hours. Lipid Profile: No results for input(s): "CHOL", "HDL", "LDLCALC", "TRIG", "CHOLHDL", "LDLDIRECT" in the last 72 hours. Thyroid Function Tests: No results for input(s): "TSH", "T4TOTAL", "FREET4", "T3FREE", "THYROIDAB" in the last 72 hours. Anemia Panel: No results for input(s): "VITAMINB12", "FOLATE", "FERRITIN", "TIBC", "IRON", "RETICCTPCT" in the last 72  hours. Urine analysis:    Component Value Date/Time   COLORURINE YELLOW (A) 03/18/2022 1501   APPEARANCEUR HAZY (A) 03/18/2022 1501   LABSPEC 1.021 03/18/2022 1501   PHURINE 5.0 03/18/2022 1501   GLUCOSEU NEGATIVE 03/18/2022 1501   HGBUR MODERATE (A) 03/18/2022 1501   BILIRUBINUR NEGATIVE 03/18/2022 1501   KETONESUR 20 (A) 03/18/2022 1501   PROTEINUR NEGATIVE 03/18/2022 1501   NITRITE NEGATIVE 03/18/2022 1501   LEUKOCYTESUR NEGATIVE 03/18/2022 1501    Radiological Exams on Admission: CT Angio Chest PE W and/or Wo Contrast  Result Date: 06/09/2022 CLINICAL DATA:  Mid sternal chest pain EXAM: CT ANGIOGRAPHY CHEST WITH CONTRAST TECHNIQUE: Multidetector CT imaging of the chest was performed using the standard protocol during bolus administration of intravenous contrast. Multiplanar CT image reconstructions and MIPs were obtained to evaluate the vascular anatomy. RADIATION DOSE REDUCTION: This exam was performed according to the departmental dose-optimization program which includes automated exposure control, adjustment of the mA and/or kV according to patient size and/or use of iterative reconstruction technique. CONTRAST:  OMNIPAQUE IOHEXOL 350 MG/ML SOLN COMPARISON:  Chest x-ray 06/09/2022, CT chest 06/04/2020 FINDINGS: Cardiovascular: Satisfactory opacification of the pulmonary arteries to the segmental level. No evidence of pulmonary embolism. Mild atherosclerosis. No aneurysm. Coronary vascular calcification. Normal cardiac size. No pericardial effusion Mediastinum/Nodes: No enlarged mediastinal, hilar, or axillary lymph nodes. Thyroid gland, trachea, and esophagus demonstrate no significant findings. Lungs/Pleura: Lungs are clear. No pleural effusion or pneumothorax. Upper Abdomen: No acute abnormality. Musculoskeletal: Old left-sided rib fracture. Review of the MIP images confirms the above findings. IMPRESSION: 1. Negative for acute pulmonary embolus. 2. Clear lung fields Aortic  Atherosclerosis (ICD10-I70.0). Electronically Signed   By: Jasmine Pang M.D.   On: 06/09/2022 19:31   DG Chest 2 View  Result Date: 06/09/2022 CLINICAL DATA:  cp/sob EXAM: CHEST - 2 VIEW COMPARISON:  08/07/2021. FINDINGS: No consolidation. No visible pleural effusions or pneumothorax. Cardiomediastinal silhouette is within normal limits. IMPRESSION: No active cardiopulmonary disease. Electronically Signed   By: Feliberto Harts M.D.   On: 06/09/2022 15:37    EKG: Independently reviewed. 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 Of note with residual weakness in right leg -continue on secondary ppx with plavix  Anemia -monitor h/h  -check anemia panel DVT prophylaxis: heaprin  Code Status: fullls) Family Communication: none at beside Disposition Plan:patient  expected to be admitted less than 2 midnights  Consults called: n/a Admission status: cardiac tele   Lurline Del MD [facility]   If 7PM-7AM, please contact night-coverage [email or web address] Password [email or web address]  06/09/2022, 10:07 PM

## 2022-06-09 NOTE — ED Provider Notes (Signed)
[facility] Provider Note    Event Date/Time   First MD Initiated Contact with Patient 06/09/22 1746     (approximate)   History   Chest Pain and Weakness   HPI  James Lambert is a 56 y.o. male   with complex past medical history including recent stroke status post thrombectomy, despite being on anticoagulation, here with intermittent chest pain.  The patient states that for the last week, but particular the last day, he has had intermittent, aching, pressure-like, chest pain with associated nausea.  He states he feels mildly short of breath with these.  They seem to come and go and are increasingly frequent.  Denies known history of coronary disease.  As mentioned, he just was admitted and had a thrombectomy for a large vessel occlusion.  He actually has been doing overall fairly well and is using a cane but moving all of his extremities.  States that the pain has been intermittent but slightly worse today.  Presents for further evaluation management take his medications as prescribed.  No new focal numbness or weakness.      Physical Exam   Triage Vital Signs: ED Triage Vitals  Enc Vitals Group     BP 06/09/22 1503 122/85     Pulse Rate 06/09/22 1503 65     Resp 06/09/22 1503 20     Temp 06/09/22 1503 98.5 F (36.9 C)     Temp src --      SpO2 06/09/22 1503 100 %     Weight 06/09/22 1501 250 lb (113.4 kg)     Height 06/09/22 1501 6\' 2"  (1.88 m)     Head Circumference --      Peak Flow --      Pain Score 06/09/22 1501 6     Pain Loc --      Pain Edu? --      Excl. in [place]? --     Most recent vital signs: Vitals:   06/10/22 0000 06/10/22 0030  BP:  133/89  Pulse: 64 67  Resp: 14 18  Temp:    SpO2: 99% 98%     General: Awake, no distress.  CV:  Good peripheral perfusion.  Regular rate and rhythm.  No murmurs. Resp:  Normal effort.  Lungs clear to auscultation bilaterally Abd:  No distention.  No tenderness. Other:  Alert, oriented.   Cranial nerves grossly intact.  Strength slightly weaker on the right, but overall 5 out of 5. On skin exam, pt has multiple draining areas in inguinal areas, with some warmth and tenderness, but no focal fluctuance or induration.   ED Results / Procedures / Treatments   Labs (all labs ordered are listed, but only abnormal results are displayed) Labs Reviewed  BASIC METABOLIC PANEL - Abnormal; Notable for the following components:      Result Value   Glucose, Bld 117 (*)    Calcium 8.6 (*)    All other components within normal limits  CBC - Abnormal; Notable for the following components:   RBC 3.62 (*)    Hemoglobin 10.8 (*)    HCT 32.7 (*)    Platelets 419 (*)    All other components within normal limits  HEPATIC FUNCTION PANEL - Abnormal; Notable for the following components:   Albumin 3.0 (*)    All other components within normal limits  RETICULOCYTES - Abnormal; Notable for the following components:   RBC. 3.37 (*)    All other  components within normal limits  IRON AND TIBC - Abnormal; Notable for the following components:   Iron 34 (*)    TIBC 189 (*)    All other components within normal limits  SARS CORONAVIRUS 2 BY RT PCR  LIPASE, BLOOD  D-DIMER, QUANTITATIVE (NOT AT [facility])  FERRITIN  FOLATE  VITAMIN B12  CBC WITH DIFFERENTIAL/PLATELET  BASIC METABOLIC PANEL  TROPONIN I (HIGH SENSITIVITY)  TROPONIN I (HIGH SENSITIVITY)  TROPONIN I (HIGH SENSITIVITY)  TROPONIN I (HIGH SENSITIVITY)     EKG Normal sinus rhythm, first-degree AV block.  Ventricular rate 68.  PR 216, QRS 80, QTc 429.  No acute ST elevations or depressions.  Nonspecific ST changes.   RADIOLOGY CT angio: Negative for PE, does note coronary atherosclerosis Chest x-ray: Clear  I also independently reviewed and agree with radiologist interpretations.   PROCEDURES:  Critical Care performed: No   .1-3 Lead EKG Interpretation  Performed by: Shaune Pollack, MD Authorized by: Shaune Pollack, MD      Interpretation: normal     ECG rate:  60-80   ECG rate assessment: normal     Rhythm: sinus rhythm     Ectopy: none     Conduction: normal   Comments:     Indication: Chest pain     MEDICATIONS ORDERED IN ED: Medications  acetaminophen (TYLENOL) tablet 650 mg (650 mg Oral Given 06/10/22 0025)  traMADol (ULTRAM) tablet 50 mg (has no administration in time range)  doxycycline (VIBRA-TABS) tablet 100 mg (has no administration in time range)  amLODipine (NORVASC) tablet 10 mg (has no administration in time range)  atorvastatin (LIPITOR) tablet 80 mg (has no administration in time range)  metoprolol succinate (TOPROL-XL) 24 hr tablet 50 mg (has no administration in time range)  pantoprazole (PROTONIX) EC tablet 40 mg (40 mg Oral Given 06/10/22 0025)  senna-docusate (Senokot-S) tablet 1 tablet (1 tablet Oral Given 06/10/22 0025)  clopidogrel (PLAVIX) tablet 75 mg (has no administration in time range)  folic acid (FOLVITE) tablet 1 mg (has no administration in time range)  Baclofen TABS 5 mg (has no administration in time range)  multivitamin with minerals tablet 1 tablet (has no administration in time range)  thiamine tablet 100 mg (has no administration in time range)  clindamycin (CLINDAGEL) 1 % gel (has no administration in time range)  heparin injection 5,000 Units (5,000 Units Subcutaneous Given 06/10/22 0026)  aspirin EC tablet 81 mg (has no administration in time range)  iohexol (OMNIPAQUE) 350 MG/ML injection 100 mL (100 mLs Intravenous Contrast Given 06/09/22 1905)  doxycycline (VIBRAMYCIN) 100 mg in sodium chloride 0.9 % 250 mL IVPB (0 mg Intravenous Stopped 06/09/22 2358)     IMPRESSION / MDM / ASSESSMENT AND PLAN / ED COURSE  I reviewed the triage vital signs and the nursing notes.                               The patient is on the cardiac monitor to evaluate for evidence of arrhythmia and/or significant heart rate changes.   Ddx:  Differential includes the following,  with pertinent life- or limb-threatening emergencies considered:  ACS, dissection, PE, GERD/gastritis, MSK chest pain, anxiety  Patient's presentation is most consistent with acute presentation with potential threat to life or bodily function.  MDM:  56 yo M with PMHx HTN, recent CVAs here with intermittent chest pain. Well appearin gon exam. CBC, BMP unremarkable. No focal abd TTP. Given  recent immobilization with acute CP and SOB, Angio obtained and shows no PE or other abnormality. CXR is clear. While pt's troponins are reassuring, pt has a highly concerning history with CAD seen on CT and h/o multiple recent CVAs, including LVO requiring thrombectomy despite being on stroke ppx. EKG here is nonischemic. Pt endorses to me ongoing intermittent chest pressure. Will admit for obs/possible provocative testing. Pt in agreement with this plan. Marland Kitchen    MEDICATIONS GIVEN IN ED: Medications  acetaminophen (TYLENOL) tablet 650 mg (650 mg Oral Given 06/10/22 0025)  traMADol (ULTRAM) tablet 50 mg (has no administration in time range)  doxycycline (VIBRA-TABS) tablet 100 mg (has no administration in time range)  amLODipine (NORVASC) tablet 10 mg (has no administration in time range)  atorvastatin (LIPITOR) tablet 80 mg (has no administration in time range)  metoprolol succinate (TOPROL-XL) 24 hr tablet 50 mg (has no administration in time range)  pantoprazole (PROTONIX) EC tablet 40 mg (40 mg Oral Given 06/10/22 0025)  senna-docusate (Senokot-S) tablet 1 tablet (1 tablet Oral Given 06/10/22 0025)  clopidogrel (PLAVIX) tablet 75 mg (has no administration in time range)  folic acid (FOLVITE) tablet 1 mg (has no administration in time range)  Baclofen TABS 5 mg (has no administration in time range)  multivitamin with minerals tablet 1 tablet (has no administration in time range)  thiamine tablet 100 mg (has no administration in time range)  clindamycin (CLINDAGEL) 1 % gel (has no administration in time range)   heparin injection 5,000 Units (5,000 Units Subcutaneous Given 06/10/22 0026)  aspirin EC tablet 81 mg (has no administration in time range)  iohexol (OMNIPAQUE) 350 MG/ML injection 100 mL (100 mLs Intravenous Contrast Given 06/09/22 1905)  doxycycline (VIBRAMYCIN) 100 mg in sodium chloride 0.9 % 250 mL IVPB (0 mg Intravenous Stopped 06/09/22 2358)     Consults:  Hosptialist   EMR reviewed  Prior H&P and DC summary from CVA and LVO requiring thromboectomy     FINAL CLINICAL IMPRESSION(S) / ED DIAGNOSES   Final diagnoses:  Chest pain with high risk for cardiac etiology     Rx / DC Orders   ED Discharge Orders     None        Note:  This document was prepared using Dragon voice recognition software and may include unintentional dictation errors.   Shaune Pollack, MD 06/10/22 [phone]

## 2022-06-09 NOTE — ED Triage Notes (Signed)
Pt via EMS from home. Pt c/o chest pain, mid-sternal. Non-radiating for the past couple of days. States that he has a massive stroke couple of weeks back. Pt is on blood thinners. Pt is A&Ox4 and NAD.

## 2022-06-09 NOTE — ED Triage Notes (Signed)
First Nurse Note:  Pt via EMS from home. Pt c/o weakness and chest pain, sharp mid-sternal. Weakness has been on and off for past couple weeks. Pt is A&Ox4 and NAD.   110 CBG 145/90 BP EMS gave 324 ASA  18G L forearm

## 2022-06-10 ENCOUNTER — Inpatient Hospital Stay: Payer: Medicaid Other

## 2022-06-10 DIAGNOSIS — L732 Hidradenitis suppurativa: Secondary | ICD-10-CM

## 2022-06-10 DIAGNOSIS — R079 Chest pain, unspecified: Secondary | ICD-10-CM

## 2022-06-10 LAB — D-DIMER, QUANTITATIVE: D-Dimer, Quant: 0.43 ug/mL-FEU (ref 0.00–0.50)

## 2022-06-10 LAB — IRON AND TIBC
Iron: 34 ug/dL — ABNORMAL LOW (ref 45–182)
Saturation Ratios: 18 % (ref 17.9–39.5)
TIBC: 189 ug/dL — ABNORMAL LOW (ref 250–450)
UIBC: 155 ug/dL

## 2022-06-10 LAB — RETICULOCYTES
Immature Retic Fract: 15.5 % (ref 2.3–15.9)
RBC.: 3.37 MIL/uL — ABNORMAL LOW (ref 4.22–5.81)
Retic Count, Absolute: 76.2 10*3/uL (ref 19.0–186.0)
Retic Ct Pct: 2.3 % (ref 0.4–3.1)

## 2022-06-10 LAB — FOLATE: Folate: 16.2 ng/mL (ref >5.9)

## 2022-06-10 LAB — FERRITIN: Ferritin: 90 ng/mL (ref 24–336)

## 2022-06-10 LAB — TROPONIN I (HIGH SENSITIVITY)
Troponin I (High Sensitivity): 5 ng/L (ref <18)
Troponin I (High Sensitivity): 5 ng/L (ref <18)

## 2022-06-10 LAB — VITAMIN B12: Vitamin B-12: 365 pg/mL (ref 180–914)

## 2022-06-10 MED ORDER — AMPICILLIN-SULBACTAM SODIUM 3 (2-1) G IJ SOLR
3.0000 g | Freq: Four times a day (QID) | INTRAMUSCULAR | Status: DC
  Administered 2022-06-10 – 2022-06-12 (×7): 3 g via INTRAVENOUS
  Filled 2022-06-10 (×2): qty 8
  Filled 2022-06-10: qty 3
  Filled 2022-06-10 (×2): qty 8
  Filled 2022-06-10 (×2): qty 3
  Filled 2022-06-10 (×2): qty 8
  Filled 2022-06-10: qty 3

## 2022-06-10 MED ORDER — IOHEXOL 300 MG/ML  SOLN
100.0000 mL | Freq: Once | INTRAMUSCULAR | Status: AC | PRN
  Administered 2022-06-10: 100 mL via INTRAVENOUS

## 2022-06-10 NOTE — Progress Notes (Signed)
PROGRESS NOTE  James Lambert  DOB: [birth year]  PCP: Aviva Kluver MAU:[id number]  DOA: 06/09/2022  LOS: 1 day  Hospital Day: 2  Brief narrative: James Lambert is a 56 y.o. 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  She has residual weakness in right leg.  Patient was brought to the ED by EMS from home with complaint of weakness off and on for last couple weeks and intermittent sharp midsternal chest pain lasting few minutes for last 3 days.  No history of trauma.  Pain worse with deep breathing and twisting. Patient also reports flare of HS with draining boils between his gluteal folds as well as groin.  In the ED, patient had temperature of 98.5, heart rate in 60s, blood pressure 122/85, oxygen saturation 100% room air. Labs showed WBC count 7.3, hemoglobin 10.8, platelet 419, unremarkable CMP, ferritin 90, D-dimer normal, COVID PCR negative, troponin negative x3 EKG without ST-T wave changes CT angio chest was negative for acute pulm embolism or any other acute cardiopulmonary process Patient was admitted to hospitalist service  Subjective: Patient was seen and examined this morning.  Pleasant middle-aged African-American male.  Lying on bed.  Not in distress.  No chest pain at the time of my evaluation. Chart reviewed Hemodynamically stable  Assessment and plan: Chest pain -with typical and atypical symptoms  -Troponin negative x3.  EKG without ST-T wave changes -Stress test was ordered on admission.  Last echo from a month ago showed EF of 60 to 65% without regional wall motion abnormalities. -No chest pain at the time of my evaluation.  Currently not requiring heparin drip.  Continue to monitor in telemetry. Recent Labs    06/09/22 1508 06/09/22 1837 06/10/22 0009  TROPONINIHS 4 3 5    Flare of HS -On  examination, noted patient has multiple wounds feel from draining in his groin as well as buttocks.  Patient was on doxycycline as an outpatient. -CT pelvis showed multifocal noncontiguous skin and subcutaneous soft tissue thickening and inflammation extending of the sacral decubitus region through bilateral gluteal cleft and perineum to the proximal thighs greater on the left.  No organized or drainable fluid collection at this time.  No CT evidence of underlying osteomyelitis or other complicating features.  Reactive pelvic and inguinal lymph nodes stable.   -General surgery consultation was obtained.  Recommended IV Unasyn at this time with intention to discharge on oral Augmentin -will need f/u with outpatient dermatology    Recent CVA with residual right leg weakness -CVA due to LVO of the left ICA s/p successful thrombectomy with noted revascularization.  -continue on secondary ppx with plavix   Mild chronic anemia -Hemoglobin close to 11.  Stable. -Ferritin level in low normal range. Recent Labs    05/17/22 0503 05/19/22 0618 05/22/22 0514 05/26/22 0615 06/09/22 1508 06/10/22 0009  HGB 11.3* 10.9* 12.1* 11.8* 10.8*  --   MCV 93.0 93.0 92.1 91.8 90.3  --   VITAMINB12  --   --   --   --   --  365  FOLATE  --   --   --   --   --  16.2  FERRITIN  --   --   --   --   --  90  TIBC  --   --   --   --   --  189*  IRON  --   --   --   --   --  34*  RETICCTPCT  --   --   --   --   --  2.3    Goals of care   Code Status: Full Code    Mobility: Encourage ambulation.  Weak since last stroke.  PT eval ordered  Skin assessment:     Nutritional status:  Body mass index is 32.1 kg/m.          Diet:  Diet Order             Diet Heart Room service appropriate? Yes; Fluid consistency: Thin  Diet effective now                   DVT prophylaxis:  heparin injection 5,000 Units Start: 06/10/22 0000   Antimicrobials: IV Unasyn Fluid: None Consultants: General  surgery Family Communication: None at bedside  Status is: Inpatient  Continue in-hospital care because: Pending stress test tomorrow.  On IV antibiotic as well, pending PT eval Level of care: Telemetry Cardiac,  Dispo: The patient is from: Home              Anticipated d/c is to: Pending PT eval              Patient currently is not medically stable to d/c.   Difficult to place patient No     Infusions:   ampicillin-sulbactam (UNASYN) IV      Scheduled Meds:  acetaminophen  650 mg Oral TID   amLODipine  10 mg Oral Daily   aspirin EC  81 mg Oral Daily   atorvastatin  80 mg Oral Daily   baclofen  5 mg Oral TID   clopidogrel  75 mg Oral Daily   doxycycline  100 mg Oral Q12H   folic acid  1 mg Oral Daily   heparin  5,000 Units Subcutaneous Q8H   metoprolol succinate  50 mg Oral Daily   multivitamin with minerals  1 tablet Oral Daily   pantoprazole  40 mg Oral QHS   senna-docusate  1 tablet Oral BID   thiamine  100 mg Oral Daily    PRN meds: traMADol   Antimicrobials: Anti-infectives (From admission, onward)    Start     Dose/Rate Route Frequency Ordered Stop   06/10/22 1700  Ampicillin-Sulbactam (UNASYN) 3 g in sodium chloride 0.9 % 100 mL IVPB        3 g 200 mL/hr over 30 Minutes Intravenous Every 6 hours 06/10/22 1629     06/10/22 1000  doxycycline (VIBRA-TABS) tablet 100 mg        100 mg Oral Every 12 hours 06/09/22 2352     06/09/22 2000  doxycycline (VIBRAMYCIN) 100 mg in sodium chloride 0.9 % 250 mL IVPB        100 mg 125 mL/hr over 120 Minutes Intravenous  Once 06/09/22 1952 06/09/22 2358       Objective: Vitals:   06/10/22 0738 06/10/22 1407  BP:  (!) 129/92  Pulse: 76 63  Resp: 18 18  Temp: (!) 97.3 F (36.3 C)   SpO2: 98% 98%    Intake/Output Summary (Last 24 hours) at 06/10/2022 1630 Last data filed at 06/10/2022 1100 Gross per 24 hour  Intake 251.38 ml  Output 200 ml  Net 51.38 ml   Filed Weights   06/09/22 1501  Weight: 113.4 kg    Weight change:  Body mass index  is 32.1 kg/m.   Physical Exam: General exam: Pleasant middle-aged African-American male.  Not in physical distress Skin: No rashes, lesions or ulcers. HEENT: Atraumatic, normocephalic, no obvious bleeding Lungs: Clear to auscultation bilaterally CVS: Regular rate and rhythm, no murmur GI/Abd soft, nontender, nondistended, bowel sound present CNS: Alert, awake, oriented x3 Groin: Multiple wounds some draining in groin and buttocks Psychiatry: Sad affect Extremities: No pedal edema, no calf tenderness  Data Review: I have personally reviewed the laboratory data and studies available.  F/u labs ordered Unresulted Labs (From admission, onward)    None       Signed, Terrilee Croak, MD [facility] 06/10/2022

## 2022-06-10 NOTE — Plan of Care (Signed)
  Problem: Education: Goal: Understanding of CV disease, CV risk reduction, and recovery process will improve 06/10/2022 2336 by Joanne Chars, RN Outcome: Progressing 06/10/2022 2139 by Joanne Chars, RN Outcome: Progressing Goal: Individualized Educational Video(s) 06/10/2022 2336 by Joanne Chars, RN Outcome: Progressing 06/10/2022 2139 by Joanne Chars, RN Outcome: Progressing   Problem: Cardiovascular: Goal: Ability to achieve and maintain adequate cardiovascular perfusion will improve 06/10/2022 2336 by Joanne Chars, RN Outcome: Progressing 06/10/2022 2139 by Joanne Chars, RN Outcome: Progressing Goal: Vascular access site(s) Level 0-1 will be maintained 06/10/2022 2336 by Joanne Chars, RN Outcome: Progressing 06/10/2022 2139 by Joanne Chars, RN Outcome: Progressing   Problem: Health Behavior/Discharge Planning: Goal: Ability to manage health-related needs will improve 06/10/2022 2336 by Joanne Chars, RN Outcome: Progressing 06/10/2022 2139 by Joanne Chars, RN Outcome: Progressing

## 2022-06-10 NOTE — Consult Note (Signed)
Pharmacy Antibiotic Note  James Lambert is a 56 y.o. male admitted on 06/09/2022 with  wound-infection .  Pharmacy has been consulted for Unasyn dosing.  Plan: Unasyn 3gm IVPB q 6 hrs  Height: 6\' 2"  (188 cm) Weight: 113.4 kg (250 lb) IBW/kg (Calculated) : 82.2  Temp (24hrs), Avg:97.3 F (36.3 C), Min:97.3 F (36.3 C), Max:97.3 F (36.3 C)  Recent Labs  Lab 06/09/22 1508  WBC 7.3  CREATININE 1.13    Estimated Creatinine Clearance: 100.1 mL/min (by C-G formula based on SCr of 1.13 mg/dL).    No Known Allergies  Antimicrobials this admission: 7/3 Doxycycline >> 7/4 Unasyn >>    Chrissy Ealey Rodriguez-Guzman PharmD, BCPS 06/10/2022 4:32 PM

## 2022-06-10 NOTE — Plan of Care (Signed)
  Problem: Education: Goal: Understanding of CV disease, CV risk reduction, and recovery process will improve Outcome: Progressing Goal: Individualized Educational Video(s) Outcome: Progressing   Problem: Activity: Goal: Ability to return to baseline activity level will improve Outcome: Progressing   Problem: Cardiovascular: Goal: Ability to achieve and maintain adequate cardiovascular perfusion will improve Outcome: Progressing Goal: Vascular access site(s) Level 0-1 will be maintained Outcome: Progressing   Problem: Health Behavior/Discharge Planning: Goal: Ability to safely manage health-related needs after discharge will improve Outcome: Progressing   Problem: Clinical Measurements: Goal: Ability to maintain clinical measurements within normal limits will improve Outcome: Progressing Goal: Will remain free from infection Outcome: Progressing Goal: Diagnostic test results will improve Outcome: Progressing Goal: Respiratory complications will improve Outcome: Progressing Goal: Cardiovascular complication will be avoided Outcome: Progressing   Problem: Elimination: Goal: Will not experience complications related to bowel motility Outcome: Progressing Goal: Will not experience complications related to urinary retention Outcome: Progressing

## 2022-06-10 NOTE — Consult Note (Signed)
Date of Consultation:  06/10/2022  Requesting Physician:  Lorin Glass, MD  Reason for Consultation:  left buttocks and perineal wounds  History of Present Illness: James Lambert is a 56 y.o. male who presented yesterday to the ED with weakness and mid-sternal chest pain.  He has a recent history of left ICA stroke requiring thrombectomy at [facility] and is currently on Aspirin and Plavix.  He reports he has had a long history of wounds in his left buttocks and perineum extending to the groin and scrotum.  He has presented to the ED multiple times over the years and more recently he was admitted on 03/18/22 with numbness of the right upper extremity and with abscesses/boils.  He was treated with IV abx only due to neurologic symptoms at the time.  Cultures of the wounds grew Strep agalactiae and diphtheroids.  He then presented again on 05/01/22 and discharged on Augmentin.  He then unfortunately presented on 6/3 with stroke like symptoms and transferred to [facility].  Surgical team there recommended abx management given his stroke symptoms.  He reports that he has continued pain in the left lower buttocks extending to the groin area.  The wounds are having drainage, some bloody, some purulent.  Denies any fevers, chills.  Past Medical History: Past Medical History:  Diagnosis Date   Asthma    COPD (chronic obstructive pulmonary disease) (HCC)    Hidradenitis suppurativa    diagnosed in [facility] ED based on history and physical exam   Hypertension      Past Surgical History: Past Surgical History:  Procedure Laterality Date   IR CT HEAD LTD  05/10/2022   IR FLUORO GUIDED NEEDLE PLC ASPIRATION/INJECTION LOC  03/20/2022   IR PERCUTANEOUS ART THROMBECTOMY/INFUSION INTRACRANIAL INC DIAG ANGIO  05/10/2022   IR US GUIDE VASC ACCESS RIGHT  05/10/2022   RADIOLOGY WITH ANESTHESIA N/A 05/10/2022   Procedure: IR WITH ANESTHESIA;  Surgeon: Julieanne Cotton, MD;  Location: [facility] OR;  Service: Radiology;  Laterality: N/A;    TEE WITHOUT CARDIOVERSION N/A 03/11/2022   Procedure: TRANSESOPHAGEAL ECHOCARDIOGRAM (TEE);  Surgeon: Lamar Blinks, MD;  Location: [facility] ORS;  Service: Cardiovascular;  Laterality: N/A;    Home Medications: Prior to Admission medications   Medication Sig Start Date End Date Taking? Authorizing Provider  acetaminophen (TYLENOL) 325 MG tablet Take 1-2 tablets (325-650 mg total) by mouth every 4 (four) hours as needed for mild pain. 05/27/22  Yes Setzer, Lynnell Jude, PA-C  amLODipine (NORVASC) 10 MG tablet Take 1 tablet (10 mg total) by mouth once daily. 05/27/22  Yes Setzer, Lynnell Jude, PA-C  atorvastatin (LIPITOR) 80 MG tablet Take 1 tablet (80 mg total) by mouth once daily. 05/27/22  Yes Setzer, Lynnell Jude, PA-C  Baclofen 5 MG TABS Take 1 tablet (5 mg) by mouth three times daily. 05/27/22  Yes Setzer, Lynnell Jude, PA-C  clindamycin (CLINDAGEL) 1 % gel Apply topically 2 (two) times daily. 05/27/22  Yes Setzer, Lynnell Jude, PA-C  clopidogrel (PLAVIX) 75 MG tablet Take 1 tablet (75 mg total) by mouth daily. 05/27/22  Yes Setzer, Lynnell Jude, PA-C  doxycycline (VIBRA-TABS) 100 MG tablet Take 1 tablet (100 mg total) by mouth every 12 (twelve) hours. 05/27/22  Yes Setzer, Lynnell Jude, PA-C  folic acid (FOLVITE) 1 MG tablet Take 1 tablet (1 mg total) by mouth daily. 05/27/22  Yes Setzer, Lynnell Jude, PA-C  metoprolol succinate (TOPROL-XL) 50 MG 24 hr tablet Take 1 tablet (50 mg total) by mouth daily. Take  with or immediately following a meal. 05/28/22  Yes Setzer, Lynnell Jude, PA-C  Multiple Vitamin (MULTIVITAMIN WITH MINERALS) TABS tablet Take 1 tablet by mouth daily. 05/27/22  Yes Setzer, Lynnell Jude, PA-C  nicotine (NICODERM CQ - DOSED IN MG/24 HOURS) 14 mg/24hr patch Place 1 patch (14 mg total) onto the skin daily. 05/28/22  Yes Setzer, Lynnell Jude, PA-C  pantoprazole (PROTONIX) 40 MG tablet Take 1 tablet (40 mg total) by mouth at bedtime. 05/27/22  Yes Setzer, Lynnell Jude, PA-C  senna-docusate (SENOKOT-S) 8.6-50 MG tablet Take 1 tablet by  mouth 2 (two) times daily. 05/27/22  Yes Setzer, Lynnell Jude, PA-C  thiamine 100 MG tablet Take 1 tablet (100 mg total) by mouth daily. 05/27/22  Yes Setzer, Lynnell Jude, PA-C  traMADol (ULTRAM) 50 MG tablet Take 1 tablet (50 mg total) by mouth every 6 (six) hours as needed. 05/27/22  Yes Setzer, Lynnell Jude, PA-C    Allergies: No Known Allergies  Social History:  reports that he has been smoking cigarettes. He has been smoking an average of .5 packs per day. He has never used smokeless tobacco. He reports current alcohol use. He reports current drug use. Drug: Marijuana.   Family History: History reviewed. No pertinent family history.  Review of Systems: Review of Systems  Constitutional:  Positive for malaise/fatigue. Negative for chills and fever.  HENT:  Negative for hearing loss.   Respiratory:  Positive for shortness of breath.   Cardiovascular:  Positive for chest pain.  Gastrointestinal:  Negative for abdominal pain, nausea and vomiting.  Genitourinary:  Negative for dysuria.  Musculoskeletal:  Negative for myalgias.  Skin:        Draining wounds left buttocks and perineum  Neurological:  Negative for dizziness.  Psychiatric/Behavioral:  Negative for depression.     Physical Exam BP (!) 129/92   Pulse 63   Temp (!) 97.3 F (36.3 C) (Oral)   Resp 18   Ht 6\' 2"  (1.88 m)   Wt 113.4 kg   SpO2 98%   BMI 32.10 kg/m  CONSTITUTIONAL: No acute distress HEENT:  Normocephalic, atraumatic, extraocular motion intact. NECK: Trachea is midline, and there is no jugular venous distension. RESPIRATORY:  Normal respiratory effort without pathologic use of accessory muscles. CARDIOVASCULAR: Regular rhythm and rate. GI: The abdomen is soft, non-distended, non-tender.  MUSCULOSKELETAL:  Normal muscle strength and tone in all four extremities.  No peripheral edema or cyanosis. SKIN: Patient has induration and multiple small wounds from the lower left buttocks extending anteriorly to the perineum  and the base of the left scrotum and the upper inner thigh.  Some purulence.  Mild tenderness to palpation throughout this area.  NEUROLOGIC:  Motor and sensation is grossly normal.  Cranial nerves are grossly intact. PSYCH:  Alert and oriented to person, place and time. Affect is normal.  Laboratory Analysis: Results for orders placed or performed during the hospital encounter of 06/09/22 (from the past 24 hour(s))  Hepatic function panel     Status: Abnormal   Collection Time: 06/09/22  6:13 PM  Result Value Ref Range   Total Protein 7.4 6.5 - 8.1 g/dL   Albumin 3.0 (L) 3.5 - 5.0 g/dL   AST 16 15 - 41 U/L   ALT 12 0 - 44 U/L   Alkaline Phosphatase 93 38 - 126 U/L   Total Bilirubin 0.4 0.3 - 1.2 mg/dL   Bilirubin, Direct 08/10/22 0.0 - 0.2 mg/dL   Indirect Bilirubin NOT CALCULATED 0.3 - 0.9 mg/dL  Lipase, blood     Status: None   Collection Time: 06/09/22  6:13 PM  Result Value Ref Range   Lipase 29 11 - 51 U/L  Troponin I (High Sensitivity)     Status: None   Collection Time: 06/09/22  6:37 PM  Result Value Ref Range   Troponin I (High Sensitivity) 3 <18 ng/L  SARS Coronavirus 2 by RT PCR (hospital order, performed in [facility] hospital lab) *cepheid single result test* Anterior Nasal Swab     Status: None   Collection Time: 06/09/22  8:47 PM   Specimen: Anterior Nasal Swab  Result Value Ref Range   SARS Coronavirus 2 by RT PCR NEGATIVE NEGATIVE  Vitamin B12     Status: None   Collection Time: 06/10/22 12:09 AM  Result Value Ref Range   Vitamin B-12 365 180 - 914 pg/mL  Reticulocytes     Status: Abnormal   Collection Time: 06/10/22 12:09 AM  Result Value Ref Range   Retic Ct Pct 2.3 0.4 - 3.1 %   RBC. 3.37 (L) 4.22 - 5.81 MIL/uL   Retic Count, Absolute 76.2 19.0 - 186.0 K/uL   Immature Retic Fract 15.5 2.3 - 15.9 %  D-dimer, quantitative     Status: None   Collection Time: 06/10/22 12:09 AM  Result Value Ref Range   D-Dimer, Quant 0.43 0.00 - 0.50 ug/mL-FEU  Iron and TIBC      Status: Abnormal   Collection Time: 06/10/22 12:09 AM  Result Value Ref Range   Iron 34 (L) 45 - 182 ug/dL   TIBC 151 (L) 761 - 607 ug/dL   Saturation Ratios 18 17.9 - 39.5 %   UIBC 155 ug/dL  Ferritin     Status: None   Collection Time: 06/10/22 12:09 AM  Result Value Ref Range   Ferritin 90 24 - 336 ng/mL  Folate     Status: None   Collection Time: 06/10/22 12:09 AM  Result Value Ref Range   Folate 16.2 >5.9 ng/mL  Troponin I (High Sensitivity)     Status: None   Collection Time: 06/10/22 12:09 AM  Result Value Ref Range   Troponin I (High Sensitivity) 5 <18 ng/L    Imaging: CT PELVIS W CONTRAST  Result Date: 06/10/2022 CLINICAL DATA:  56 year old male with hidradenitis, multiple draining areas in the groin, gluteal cleft. EXAM: CT PELVIS WITH CONTRAST TECHNIQUE: Multidetector CT imaging of the pelvis was performed using the standard protocol following the bolus administration of intravenous contrast. RADIATION DOSE REDUCTION: This exam was performed according to the departmental dose-optimization program which includes automated exposure control, adjustment of the mA and/or kV according to patient size and/or use of iterative reconstruction technique. CONTRAST:  OMNIPAQUE IOHEXOL 300 MG/ML  SOLN COMPARISON:  CT pelvis 06/04/2020. FINDINGS: Urinary Tract: Mild motion artifact. Negative right renal lower pole. Unremarkable bladder. Bowel: Rectum and anal verge appear within normal limits. No dilated large or small bowel in the lower abdomen. Normal appendix visible on series 2, image 17. Diverticulosis of the cecum and ascending colon. No free air or mesenteric inflammation. Vascular/Lymphatic: Aortoiliac calcified atherosclerosis. Tortuous distal aorta, bilateral iliac arteries. Major arterial structures appear to remain patent. Pelvic and inguinal lymph nodes appears stable since 2021, the largest again in proximity to the left inguinal canal measuring 17 mm on series 2, image 49.  No cystic or necrotic nodes. Reproductive: No scrotal soft tissue gas or enlargement. Bulky skin and subcutaneous soft tissue thickening in the perineum  eccentric to the left and continuing into the medial left upper thigh (up to 19 mm on series 2, image 75. Involvement of the left greater than right gluteal clefts which are abnormally thickened up to 2.8 cm (image 71). And continued decubitus region skin and soft tissue thickening which tracks cephalad overlying the coccyx and sacrum greater on the left (up to 16 mm series 2, image 23). No associated organized or drainable fluid collection. The tip of the coccyx appears stable and intact. Other:  No pelvic free fluid.  Numerous pelvic phleboliths. Musculoskeletal: No acute osseous abnormality identified. Partially visible L3-L4 endplate and facet degeneration. Abnormal subcutaneous soft tissues described above. IMPRESSION: 1. Multifocal and contiguous skin and subcutaneous soft tissue thickening and inflammation extending from the sacral decubitus region through the bilateral gluteal cleft and perineum to the proximal thighs, greater on the left. No organized or drainable fluid collection at this time. No CT evidence of underlying osteomyelitis or other complicating features. 2. Reactive appearing pelvic and inguinal lymph nodes appear stable since 2021. 3. Aortic Atherosclerosis (ICD10-I70.0). Electronically Signed   By: Odessa Fleming M.D.   On: 06/10/2022 11:27   CT Angio Chest PE W and/or Wo Contrast  Result Date: 06/09/2022 CLINICAL DATA:  Mid sternal chest pain EXAM: CT ANGIOGRAPHY CHEST WITH CONTRAST TECHNIQUE: Multidetector CT imaging of the chest was performed using the standard protocol during bolus administration of intravenous contrast. Multiplanar CT image reconstructions and MIPs were obtained to evaluate the vascular anatomy. RADIATION DOSE REDUCTION: This exam was performed according to the departmental dose-optimization program which includes automated  exposure control, adjustment of the mA and/or kV according to patient size and/or use of iterative reconstruction technique. CONTRAST:  OMNIPAQUE IOHEXOL 350 MG/ML SOLN COMPARISON:  Chest x-ray 06/09/2022, CT chest 06/04/2020 FINDINGS: Cardiovascular: Satisfactory opacification of the pulmonary arteries to the segmental level. No evidence of pulmonary embolism. Mild atherosclerosis. No aneurysm. Coronary vascular calcification. Normal cardiac size. No pericardial effusion Mediastinum/Nodes: No enlarged mediastinal, hilar, or axillary lymph nodes. Thyroid gland, trachea, and esophagus demonstrate no significant findings. Lungs/Pleura: Lungs are clear. No pleural effusion or pneumothorax. Upper Abdomen: No acute abnormality. Musculoskeletal: Old left-sided rib fracture. Review of the MIP images confirms the above findings. IMPRESSION: 1. Negative for acute pulmonary embolus. 2. Clear lung fields Aortic Atherosclerosis (ICD10-I70.0). Electronically Signed   By: Jasmine Pang M.D.   On: 06/09/2022 19:31    Assessment and Plan: This is a 56 y.o. male with multiple draining wounds from left buttocks to perineum, left inner thigh and base of scrotum.  --All these wounds are consistent with history of hydradenitis.  He has presented to the ED multiple times in the past and has been treated with antibiotic management.  Unfortunately he never followed up with surgical team or with his PCP and has never seen a dermatologist.  At this point with his recent history of stroke and being on Aspirin and Plavix, and him presenting with chest pain, would attempt first to treat conservatively with antibiotic management.  Given prior cultures, could try IV Unasyn.  If in the next 48 hrs there's no improvement and his cardiac workup is negative, then would have to do I&D instead.   --Patient understands this plan and all of his questions have been answered.  Will continue to follow with you.  I spent 60 minutes dedicated to  the care of this patient on the date of this encounter to include pre-visit review of records, face-to-face time with the patient  discussing diagnosis and management, and any post-visit coordination of care.   Howie IllJose Luis Abeni Finchum, MD [facility]:  [phone]

## 2022-06-11 ENCOUNTER — Inpatient Hospital Stay: Payer: Medicaid Other

## 2022-06-11 DIAGNOSIS — L732 Hidradenitis suppurativa: Secondary | ICD-10-CM

## 2022-06-11 LAB — NM MYOCAR MULTI W/SPECT W/WALL MOTION / EF
Base ST Depression (mm): 0 mm
Estimated workload: 1
Exercise duration (min): 1 min
Exercise duration (sec): 6 s
LV dias vol: 135 mL (ref 62–150)
LV sys vol: 54 mL
MPHR: 166 {beats}/min
Nuc Stress EF: 60 %
Peak HR: 91 {beats}/min
Percent HR: 63 %
Rest HR: 64 {beats}/min
Rest Nuclear Isotope Dose: 10.2 mCi
SDS: 1
SRS: 11
SSS: 1
ST Depression (mm): 0 mm
Stress Nuclear Isotope Dose: 29.8 mCi
TID: 0.93

## 2022-06-11 MED ORDER — TECHNETIUM TC 99M TETROFOSMIN IV KIT
10.2400 | PACK | Freq: Once | INTRAVENOUS | Status: AC | PRN
  Administered 2022-06-11: 10.24 via INTRAVENOUS

## 2022-06-11 MED ORDER — TECHNETIUM TC 99M TETROFOSMIN IV KIT
29.7600 | PACK | Freq: Once | INTRAVENOUS | Status: AC | PRN
  Administered 2022-06-11: 29.76 via INTRAVENOUS

## 2022-06-11 MED ORDER — REGADENOSON 0.4 MG/5ML IV SOLN
0.4000 mg | Freq: Once | INTRAVENOUS | Status: AC
Start: 2022-06-11 — End: 2022-06-11
  Administered 2022-06-11: 0.4 mg via INTRAVENOUS

## 2022-06-11 NOTE — Consult Note (Signed)
WOC Nurse Consult Note: Reason for Consult:Full thickness. Patient with history of hidradenitis suppurativa  (HS) and has presented on several occasions in the past to both this and the [place] [facility] [place] [facility] where he has been seen by surgery and treated conservatively with antibiotics. He was seen yesterday by Surgery (Dr. Aleen Campi). Daily wound care has been ordered. Wound type:autoimmune, infectious Pressure Injury POA: Yes/No/NA Measurement: numerous boil-like lesions in the buttock, perineal, inguinal areas Wound bed:N/A Drainage (amount, consistency, odor): serosanguinous, purulent Periwound:with evidence of previously healed lesions, scarring Dressing procedure/placement/frequency: I will provide guidance for Nursing for dressing changes and have recommended soap and water cleansing followed by topical care consisting of an antimicrobial nonadherent, xeroform gauze topped with dry gauze/ABD pads and securement with tape. This is to be done daily, as Dr. Aleen Campi ordered, but also PRN soiling or dressing dislodgement.  Hidradenitis suppurativa is a complex dermatologic condition and further recommendations are outside the scope of WOC Nursing.   WOC nursing team will not follow, but will remain available to this patient, the nursing and medical teams.  Please re-consult if needed.  Thank you for inviting [place] to participate in this patient's Plan of Care.  Ladona Mow, MSN, RN, CNS, GNP, Leda Min, [employer], [employer] phone:  [phone]

## 2022-06-11 NOTE — Progress Notes (Signed)
[facility] SURGICAL PROGRESS NOTE (cpt [phone])  Hospital Day(s): 2.   Interval History: Patient seen and examined, no acute events or new complaints overnight. Patient reports his pain is improving; sore but overall improved compared to days prior. No fever, chills. Some bleeding from the left perineal wound; stopped at time of our evaluation. No new labs this morning. He continues on Unasyn.   Review of Systems:  Constitutional: denies fever, chills  HEENT: denies cough or congestion  Respiratory: denies any shortness of breath  Cardiovascular: denies chest pain or palpitations  Genitourinary: denies burning with urination or urinary frequency Musculoskeletal: denies pain, decreased motor or sensation Integumentary: + Left buttock, groin, perineum, and scrotal wounds  Vital signs in last 24 hours: [min-max] current  Temp:  [97.7 F (36.5 C)-98.4 F (36.9 C)] 97.7 F (36.5 C) (07/05 0728) Pulse Rate:  [61-73] 73 (07/05 0728) Resp:  [16-20] 16 (07/05 0728) BP: (129-149)/(80-99) 146/91 (07/05 0728) SpO2:  [93 %-100 %] 100 % (07/05 0728) Weight:  [115.6 kg] 115.6 kg (07/05 0300)     Height: 6\' 2"  (188 cm) Weight: 115.6 kg BMI (Calculated): 32.71   Intake/Output last 2 shifts:  07/04 0701 - 07/05 0700 In: 300 [IV Piggyback:300] Out: 1950 [Urine:1950]   Physical Exam:  Constitutional: alert, cooperative and no distress  HENT: normocephalic without obvious abnormality  Eyes: PERRL, EOM's grossly intact and symmetric  Respiratory: breathing non-labored at rest  Cardiovascular: regular rate and sinus rhythm  Integumentary: Chaperone present, he has multiple draining wound to the left buttock, perineum, and scrotum. The worst area of tenderness and fluctuance appears to be in the superior left buttock. Small punctate areas of purulence throughout. He did have blood on his diaper but on examination there is no gross areas of bleeding.    Labs:     Latest Ref Rng &  Units 06/09/2022    3:08 PM 05/26/2022    6:15 AM 05/22/2022    5:14 AM  CBC  WBC 4.0 - 10.5 K/uL 7.3  5.9  4.4   Hemoglobin 13.0 - 17.0 g/dL 05/24/2022  65.7  84.6   Hematocrit 39.0 - 52.0 % 32.7  35.6  35.9   Platelets 150 - 400 K/uL 419  381  390       Latest Ref Rng & Units 06/09/2022    6:13 PM 06/09/2022    3:08 PM 05/26/2022    6:15 AM  CMP  Glucose 70 - 99 mg/dL  05/28/2022  97   BUN 6 - 20 mg/dL  7  19   Creatinine 952 - 1.24 mg/dL  8.41  3.24   Sodium 4.01 - 145 mmol/L  138  135   Potassium 3.5 - 5.1 mmol/L  3.5  4.2   Chloride 98 - 111 mmol/L  105  98   CO2 22 - 32 mmol/L  24  23   Calcium 8.9 - 10.3 mg/dL  8.6  9.5   Total Protein 6.5 - 8.1 g/dL 7.4     Total Bilirubin 0.3 - 1.2 mg/dL 0.4     Alkaline Phos 38 - 126 U/L 93     AST 15 - 41 U/L 16     ALT 0 - 44 U/L 12        Imaging studies: No new pertinent imaging studies   Assessment/Plan: (ICD-10's: L73.2) 56 y.o. male with likely hidradenitis to the left buttocks to perineum, left inner thigh and base of scrotum   - Pain is  improving, there are still some areas of fluctuance and tenderness particularly on the left upper buttock. Again, his recent history of stroke and being on Aspirin and Plavix, we will continue with conservative management of these with IV Abx understanding that if some areas do not improve (ex: left upper buttock), we may need to proceed with I&D of these areas. Patient is understanding.   - Continue IV Abx (Unasyn) - Pain control prn - Further management per primary service; we will of course follow    All of the above findings and recommendations were discussed with the patient, and the medical team, and all of patient's questions were answered to his expressed satisfaction.  -- Lynden Oxford, PA-C [facility] 06/11/2022, 1:35 PM M-F: 7am - 4pm

## 2022-06-11 NOTE — Progress Notes (Signed)
PROGRESS NOTE  James Lambert  DOB: March 24, 1966  PCP: Aviva Kluver UDJ:[id number]  DOA: 06/09/2022  LOS: 2 days  Hospital Day: 3  Brief narrative: James Lambert is a 56 y.o. 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  She has residual weakness in right leg.  Patient was brought to the ED by EMS from home with complaint of weakness off and on for last couple weeks and intermittent sharp midsternal chest pain lasting few minutes for last 3 days.  No history of trauma.  Pain worse with deep breathing and twisting. Patient also reports flare of HS with draining boils between his gluteal folds as well as groin.  In the ED, patient had temperature of 98.5, heart rate in 60s, blood pressure 122/85, oxygen saturation 100% room air. Labs showed WBC count 7.3, hemoglobin 10.8, platelet 419, unremarkable CMP, ferritin 90, D-dimer normal, COVID PCR negative, troponin negative x3 EKG without ST-T wave changes CT angio chest was negative for acute pulm embolism or any other acute cardiopulmonary process Patient was admitted to hospitalist service  Subjective: Patient was seen and examined this morning.   Sitting up at the edge of the bed.  Not in distress.  No new symptoms.  Underwent stress test.  Pending report.    Assessment and plan: Chest pain -Presented with typical and atypical symptoms  -Troponin negative x3.  EKG without ST-T wave changes -Stress test done today.  Pending report.  Last echo from a month ago showed EF of 60 to 65% without regional wall motion abnormalities. -No chest pain at the time of my evaluation.  Currently not requiring heparin drip.  Continue to monitor in telemetry. Recent Labs    06/09/22 1837 06/10/22 0009 06/10/22 1902  TROPONINIHS 3 5 5     Flare of HS -On examination, noted patient has  multiple wounds feel from draining in his groin as well as buttocks.  Patient was on doxycycline as an outpatient. -CT pelvis showed multifocal noncontiguous skin and subcutaneous soft tissue thickening and inflammation extending of the sacral decubitus region through bilateral gluteal cleft and perineum to the proximal thighs greater on the left.  No organized or drainable fluid collection at this time.  No CT evidence of underlying osteomyelitis or other complicating features.  Reactive pelvic and inguinal lymph nodes stable.   -General surgery consultation was obtained.  Recommended IV Unasyn at this time with intention to discharge on oral Augmentin.  Wound care consult appreciated. -will need f/u with outpatient dermatology    Recent CVA with residual right leg weakness -CVA due to LVO of the left ICA s/p successful thrombectomy with noted revascularization.  -continue on secondary ppx with plavix   Mild chronic anemia -Hemoglobin close to 11.  Stable. -Ferritin level in low normal range. Recent Labs    05/17/22 0503 05/19/22 0618 05/22/22 0514 05/26/22 0615 06/09/22 1508 06/10/22 0009  HGB 11.3* 10.9* 12.1* 11.8* 10.8*  --   MCV 93.0 93.0 92.1 91.8 90.3  --   VITAMINB12  --   --   --   --   --  365  FOLATE  --   --   --   --   --  16.2  FERRITIN  --   --   --   --   --  90  TIBC  --   --   --   --   --  189*  IRON  --   --   --   --   --  34*  RETICCTPCT  --   --   --   --   --  2.3    Goals of care   Code Status: Full Code    Mobility: Encourage ambulation.  Weak since last stroke.  PT eval ordered  Skin assessment:     Nutritional status:  Body mass index is 32.72 kg/m.          Diet:  Diet Order             Diet Heart Room service appropriate? Yes; Fluid consistency: Thin  Diet effective now                   DVT prophylaxis:  heparin injection 5,000 Units Start: 06/10/22 0000   Antimicrobials: IV Unasyn Fluid: None Consultants: General  surgery Family Communication: None at bedside  Status is: Inpatient  Continue in-hospital care because: Pending stress test report.  To continue IV Unasyn till tomorrow. Level of care: Telemetry Cardiac,  Dispo: The patient is from: Home              Anticipated d/c is to: Hopefully home tomorrow.              Patient currently is not medically stable to d/c.   Difficult to place patient No     Infusions:   ampicillin-sulbactam (UNASYN) IV 3 g (06/11/22 1454)    Scheduled Meds:  acetaminophen  650 mg Oral TID   amLODipine  10 mg Oral Daily   aspirin EC  81 mg Oral Daily   atorvastatin  80 mg Oral Daily   baclofen  5 mg Oral TID   clopidogrel  75 mg Oral Daily   doxycycline  100 mg Oral Q12H   folic acid  1 mg Oral Daily   heparin  5,000 Units Subcutaneous Q8H   metoprolol succinate  50 mg Oral Daily   multivitamin with minerals  1 tablet Oral Daily   pantoprazole  40 mg Oral QHS   senna-docusate  1 tablet Oral BID   thiamine  100 mg Oral Daily    PRN meds: traMADol   Antimicrobials: Anti-infectives (From admission, onward)    Start     Dose/Rate Route Frequency Ordered Stop   06/10/22 1700  Ampicillin-Sulbactam (UNASYN) 3 g in sodium chloride 0.9 % 100 mL IVPB        3 g 200 mL/hr over 30 Minutes Intravenous Every 6 hours 06/10/22 1629     06/10/22 1000  doxycycline (VIBRA-TABS) tablet 100 mg        100 mg Oral Every 12 hours 06/09/22 2352     06/09/22 2000  doxycycline (VIBRAMYCIN) 100 mg in sodium chloride 0.9 % 250 mL IVPB        100 mg 125 mL/hr over 120 Minutes Intravenous  Once 06/09/22 1952 06/09/22 2358       Objective: Vitals:   06/11/22 0351 06/11/22 0728  BP: 137/80 (!) 146/91  Pulse: 67 73  Resp: 16 16  Temp: 97.9 F (36.6 C) 97.7 F (36.5 C)  SpO2: 100% 100%    Intake/Output Summary (Last 24 hours) at 06/11/2022 1546 Last data filed at 06/11/2022 1500 Gross per 24 hour  Intake 440 ml  Output 1950 ml  Net -1510 ml    Filed Weights    06/09/22 1501 06/11/22 0300  Weight: 113.4 kg 115.6 kg  Weight change: 2.201 kg Body mass index is 32.72 kg/m.   Physical Exam: General exam: Pleasant middle-aged African-American male.  Not in physical distress Skin: No rashes, lesions or ulcers. HEENT: Atraumatic, normocephalic, no obvious bleeding Lungs: Clear to auscultation bilaterally CVS: Regular rate and rhythm, no murmur GI/Abd soft, nontender, nondistended, bowel sound present CNS: Alert, awake, oriented x3 Groin: Multiple wounds some draining in groin and buttocks Psychiatry: Sad affect Extremities: No pedal edema, no calf tenderness  Data Review: I have personally reviewed the laboratory data and studies available.  F/u labs ordered Unresulted Labs (From admission, onward)    None       Signed, Lorin Glass, MD [facility] 06/11/2022

## 2022-06-11 NOTE — Evaluation (Signed)
Physical Therapy Evaluation Patient Details Name: James Lambert MRN: [id number] DOB: Nov 15, 1966 Today's Date: 06/11/2022  History of Present Illness  James Lambert is a 56 y.o. male   with complex past medical history including recent stroke status post thrombectomy, despite being on anticoagulation, here with intermittent chest pain.  The patient states that for the last week, but particular the last day, he has had intermittent, aching, pressure-like, chest pain with associated nausea.  He states he feels mildly short of breath with these.  They seem to come and go and are increasingly frequent.  Denies known history of coronary disease.  As mentioned, he just was admitted and had a thrombectomy for a large vessel occlusion.    Clinical Impression  Pt admitted with above diagnosis. Pt received supine in bed agreeable to PT services. Pt coming from home living with brother. Has had recent admissions with recent stay at [facility]. Pt reports no difficulties managing at home since d/c from there but relying on RW for mobility in his household. Has no transportation currently. Able to assist his brother PRN as his brother is disabled. Pt denying any falls.   To date pt mod-I with bed mobility standing with supervision without AD with SUE support on counter to attain full standing.  Pt ambulating ~50' in room with RW per pt preference as pt denies attempts in hallway at this time. Pt requires min VC's for foot positioning with turns but otherwise demonstrates safe use of RW. Pt demonstrates ability to stand at toielt and void bladder without RW with good static standing tolerance then wash hands with anterior trunk lean past BOS without LOB. Pt returning to EOB. Pt appropriate for OPPT services to progress gait/balance deficits. MD and TOC updated. All needs in place and within reach. Pt currently with functional limitations due to the deficits listed below (see PT Problem List). Pt will benefit from skilled  PT to increase their independence and safety with mobility to allow discharge to the venue listed below.     Recommendations for follow up therapy are one component of a multi-disciplinary discharge planning process, led by the attending physician.  Recommendations may be updated based on patient status, additional functional criteria and insurance authorization.  Follow Up Recommendations Outpatient PT      Assistance Recommended at Discharge PRN  Patient can return home with the following  Assist for transportation    Equipment Recommendations None recommended by PT  Recommendations for Other Services       Functional Status Assessment Patient has had a recent decline in their functional status and demonstrates the ability to make significant improvements in function in a reasonable and predictable amount of time.     Precautions / Restrictions Precautions Precautions: Fall Restrictions Weight Bearing Restrictions: No      Mobility  Bed Mobility Overal bed mobility: Modified Independent             General bed mobility comments: bed features Patient Response: Cooperative  Transfers Overall transfer level: Needs assistance Equipment used: None Transfers: Sit to/from Stand Sit to Stand: Supervision           General transfer comment: Once standing relied with SUE on counter to attain upright standing    Ambulation/Gait Ambulation/Gait assistance: Supervision Gait Distance (Feet): 50 Feet Assistive device: Rolling walker (2 wheels) Gait Pattern/deviations: Step-through pattern, Decreased step length - right, Decreased step length - left          Stairs  Wheelchair Mobility    Modified Rankin (Stroke Patients Only)       Balance Overall balance assessment: Needs assistance Sitting-balance support: No upper extremity supported, Feet supported Sitting balance-Leahy Scale: Good       Standing balance-Leahy Scale: Good Standing  balance comment: able to lean anteriorly outside BOS to wash hands in sink without need for UE's                             Pertinent Vitals/Pain Pain Assessment Pain Assessment: 0-10 Pain Score: 8  Pain Location: buttock wounds Pain Descriptors / Indicators: Discomfort Pain Intervention(s): Limited activity within patient's tolerance, Monitored during session, Repositioned    Home Living Family/patient expects to be discharged to:: Private residence Living Arrangements: Other relatives Available Help at Discharge: Available 24 hours/day;Available PRN/intermittently;Family;Friend(s) Type of Home: Apartment Home Access: Stairs to enter Entrance Stairs-Rails: Right;Left;Can reach both Entrance Stairs-Number of Steps: 4   Home Layout: One level Home Equipment: BSC/3in1;Shower seat;Grab bars - tub/shower;Cane - single point;Rollator (4 wheels);Other (comment);Rolling Walker (2 wheels)      Prior Function               Mobility Comments: Use of RW in home ADLs Comments: Independent with ADL's     Hand Dominance        Extremity/Trunk Assessment   Upper Extremity Assessment Upper Extremity Assessment: Overall WFL for tasks assessed    Lower Extremity Assessment Lower Extremity Assessment: Generalized weakness    Cervical / Trunk Assessment Cervical / Trunk Assessment: Normal  Communication   Communication: Expressive difficulties  Cognition Arousal/Alertness: Awake/alert Behavior During Therapy: WFL for tasks assessed/performed Overall Cognitive Status: Within Functional Limits for tasks assessed                                          General Comments      Exercises Other Exercises Other Exercises: Role of PT in acute setting, d/c recs   Assessment/Plan    PT Assessment Patient needs continued PT services  PT Problem List Decreased strength;Decreased mobility;Decreased safety awareness;Decreased balance;Decreased skin  integrity       PT Treatment Interventions DME instruction;Therapeutic exercise;Gait training;Balance training;Stair training;Neuromuscular re-education;Functional mobility training;Patient/family education;Therapeutic activities    PT Goals (Current goals can be found in the Care Plan section)  Acute Rehab PT Goals Patient Stated Goal: to improve balance and walking capabilities PT Goal Formulation: With patient Time For Goal Achievement: 06/25/22 Potential to Achieve Goals: Good    Frequency Min 2X/week     Co-evaluation               AM-PAC PT "6 Clicks" Mobility  Outcome Measure Help needed turning from your back to your side while in a flat bed without using bedrails?: A Little Help needed moving from lying on your back to sitting on the side of a flat bed without using bedrails?: A Little Help needed moving to and from a bed to a chair (including a wheelchair)?: A Little Help needed standing up from a chair using your arms (e.g., wheelchair or bedside chair)?: A Little Help needed to walk in hospital room?: A Little Help needed climbing 3-5 steps with a railing? : A Lot 6 Click Score: 17    End of Session Equipment Utilized During Treatment: Gait belt Activity Tolerance: Patient tolerated treatment well Patient  left: in bed;with call bell/phone within reach;with bed alarm set Nurse Communication: Mobility status PT Visit Diagnosis: Other abnormalities of gait and mobility (R26.89);Muscle weakness (generalized) (M62.81)    Time: 5732-2025 PT Time Calculation (min) (ACUTE ONLY): 14 min   Charges:   PT Evaluation $PT Eval Moderate Complexity: 1 Mod         Toa Mia M. Fairly IV, PT, DPT Physical Therapist- [place]  [facility]  06/11/2022, 2:22 PM

## 2022-06-12 ENCOUNTER — Other Ambulatory Visit: Payer: Self-pay

## 2022-06-12 MED ORDER — DOXYCYCLINE HYCLATE 100 MG PO TABS
100.0000 mg | ORAL_TABLET | Freq: Two times a day (BID) | ORAL | 0 refills | Status: DC
  Filled 2022-06-12: qty 60, 30d supply, fill #0

## 2022-06-12 MED ORDER — SACCHAROMYCES BOULARDII 250 MG PO CAPS
250.0000 mg | ORAL_CAPSULE | Freq: Two times a day (BID) | ORAL | 0 refills | Status: AC
  Filled 2022-06-12: qty 60, 30d supply, fill #0

## 2022-06-12 MED ORDER — AMOXICILLIN-POT CLAVULANATE 875-125 MG PO TABS
1.0000 | ORAL_TABLET | Freq: Two times a day (BID) | ORAL | 0 refills | Status: DC
  Filled 2022-06-12: qty 20, 10d supply, fill #0

## 2022-06-12 MED ORDER — CLINDAMYCIN PHOSPHATE 1 % EX GEL
Freq: Two times a day (BID) | CUTANEOUS | 0 refills | Status: AC
  Filled 2022-06-12: qty 60, 30d supply, fill #0

## 2022-06-12 NOTE — Progress Notes (Signed)
[facility] SURGICAL PROGRESS NOTE (cpt [phone])  Hospital Day(s): 3.   Interval History: Patient seen and examined, no acute events or new complaints overnight. Patient reports he is doing okay; still sore but pain is improved. No fever, chills, No new labs this morning. Still with small amount of intermittent punctate bleeding. He is eating breakfast at time of evaluation.   Review of Systems:  Constitutional: denies fever, chills  HEENT: denies cough or congestion  Respiratory: denies any shortness of breath  Cardiovascular: denies chest pain or palpitations  Genitourinary: denies burning with urination or urinary frequency Musculoskeletal: denies pain, decreased motor or sensation Integumentary: + Left buttock, groin, perineum, and scrotal wounds  Vital signs in last 24 hours: [min-max] current  Temp:  [97.6 F (36.4 C)-98.9 F (37.2 C)] 97.6 F (36.4 C) (07/06 0723) Pulse Rate:  [64-74] 64 (07/06 0723) Resp:  [17-20] 17 (07/06 0723) BP: (137-152)/(87-99) 137/99 (07/06 0723) SpO2:  [94 %-99 %] 99 % (07/06 0723) Weight:  [111.3 kg] 111.3 kg (07/06 0458)     Height: 6\' 2"  (188 cm) Weight: 111.3 kg BMI (Calculated): 31.48   Intake/Output last 2 shifts:  07/05 0701 - 07/06 0700 In: 1030.6 [P.O.:737; IV Piggyback:293.6] Out: 2235 [Urine:2235]   Physical Exam:  Constitutional: alert, cooperative and no distress  HENT: normocephalic without obvious abnormality  Eyes: PERRL, EOM's grossly intact and symmetric  Respiratory: breathing non-labored at rest  Cardiovascular: regular rate and sinus rhythm  Integumentary: Chaperone present, he has multiple wounds to the left buttock, perineum, and scrotum. The worst area of tenderness and fluctuance still appears to be in the superior left buttock. Small punctate areas of purulence throughout. The remaining areas seem to be more indurated than fluctuant. He did have blood on his diaper again this morning but on examination  there is no gross areas of bleeding.    Labs:     Latest Ref Rng & Units 06/09/2022    3:08 PM 05/26/2022    6:15 AM 05/22/2022    5:14 AM  CBC  WBC 4.0 - 10.5 K/uL 7.3  5.9  4.4   Hemoglobin 13.0 - 17.0 g/dL 05/24/2022  08.6  57.8   Hematocrit 39.0 - 52.0 % 32.7  35.6  35.9   Platelets 150 - 400 K/uL 419  381  390       Latest Ref Rng & Units 06/09/2022    6:13 PM 06/09/2022    3:08 PM 05/26/2022    6:15 AM  CMP  Glucose 70 - 99 mg/dL  05/28/2022  97   BUN 6 - 20 mg/dL  7  19   Creatinine 629 - 1.24 mg/dL  5.28  4.13   Sodium 2.44 - 145 mmol/L  138  135   Potassium 3.5 - 5.1 mmol/L  3.5  4.2   Chloride 98 - 111 mmol/L  105  98   CO2 22 - 32 mmol/L  24  23   Calcium 8.9 - 10.3 mg/dL  8.6  9.5   Total Protein 6.5 - 8.1 g/dL 7.4     Total Bilirubin 0.3 - 1.2 mg/dL 0.4     Alkaline Phos 38 - 126 U/L 93     AST 15 - 41 U/L 16     ALT 0 - 44 U/L 12        Imaging studies: No new pertinent imaging studies   Assessment/Plan: (ICD-10's: L73.2) 56 y.o. male with likely hidradenitis to the left buttocks to perineum, left  inner thigh and base of scrotum   - Pain is improving, there are still some areas of fluctuance and tenderness particularly on the left upper buttock. Again, his recent history of stroke and being on Aspirin and Plavix, we will continue with conservative management of these with IV Abx understanding that if some areas do not improve (ex: left upper buttock), we may need to proceed with I&D of these areas; ? tomorrow. Defer decision to Dr Aleen Campi. Patient is understanding.             - Continue IV Abx (Unasyn) - Pain control prn - Further management per primary service; we will of course follow    All of the above findings and recommendations were discussed with the patient, and the medical team, and all of patient's questions were answered to his expressed satisfaction.  -- Lynden Oxford, PA-C [facility] 06/12/2022, 7:31 AM M-F: 7am - 4pm

## 2022-06-12 NOTE — Discharge Summary (Signed)
Physician Discharge Summary  James Lambert [id number]:[id number] DOB: Mar 28, 1966 DOA: 06/09/2022  PCP: Pcp, No  Admit date: 06/09/2022 Discharge date: 06/12/2022  Admitted From: Home Discharge disposition: Home with outpatient PT OT  Recommendations at discharge:  Complete 10 more days of oral Augmentin Follow-up with general surgery as an outpatient Referral given for dermatology for outpatient evaluation.   Brief narrative: James Lambert is a 56 y.o. 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  She has residual weakness in right leg.  Patient was brought to the ED by EMS from home with complaint of weakness off and on for last couple weeks and intermittent sharp midsternal chest pain lasting few minutes for last 3 days.  No history of trauma.  Pain worse with deep breathing and twisting. Patient also reports flare of HS with draining boils between his gluteal folds as well as groin.  In the ED, patient had temperature of 98.5, heart rate in 60s, blood pressure 122/85, oxygen saturation 100% room air. Labs showed WBC count 7.3, hemoglobin 10.8, platelet 419, unremarkable CMP, ferritin 90, D-dimer normal, COVID PCR negative, troponin negative x3 EKG without ST-T wave changes CT angio chest was negative for acute pulm embolism or any other acute cardiopulmonary process Patient was admitted to hospitalist service  Subjective: Patient was seen and examined this morning.   Working with PT.  Not in distress.  No new symptoms.  Hospital course: Chest pain -Presented with typical and atypical symptoms  -Troponin negative x3.  EKG without ST-T wave changes -Stress test done negative. Last echo from a month ago showed EF of 60 to 65% without regional wall motion abnormalities. -No chest pain at the time of my evaluation.     Flare of HS -On examination, noted patient has multiple wounds feel from draining in his groin as well as buttocks.  Patient was on doxycycline as an outpatient. -CT pelvis showed multifocal noncontiguous skin and subcutaneous soft tissue thickening and inflammation extending of the sacral decubitus region through bilateral gluteal cleft and perineum to the proximal thighs greater on the left.  No organized or drainable fluid collection at this time.  No CT evidence of underlying osteomyelitis or other complicating features.  Reactive pelvic and inguinal lymph nodes stable.   -General surgery consultation was obtained.  Patient was started on IV Unasyn.  Switch to oral Augmentin at discharge to complete 2 weeks course.  Per general surgery, patient will follow-up with them next week to assess for the need of I&D from residual induration/wound. -Continue clindamycin gel. -will need f/u with outpatient dermatology.  Contact information of dermatologists in town given.  Recent CVA with residual right leg weakness -CVA due to LVO of the left ICA s/p successful thrombectomy with noted revascularization.  -continue on secondary ppx with plavix   Mild chronic anemia -Hemoglobin close to 11.  Stable. -Ferritin level in low normal range.  Wounds:  - Wound / Incision (Open or Dehisced) 06/10/22 Other (Comment) Groin Right;Left open wounds bilateral groin (Active)  Date First Assessed/Time First Assessed: 06/10/22 1901   Wound Type: Other (Comment)  Location: Groin  Location Orientation: Right;Left  Wound Description (Comments): open wounds bilateral groin  Present on Admission: Yes    Assessments 06/11/2022  8:30 AM 06/12/2022 10:15 AM  Dressing Type None None  Dressing Changed --  Other (Comment)  Dressing Status -- New drainage  Dressing Change Frequency -- Twice a day  Site / Wound Assessment Bleeding Bleeding  Peri-wound Assessment Erythema (blanchable);Intact Erythema (blanchable);Intact   Drainage Amount Scant Scant  Drainage Description Sanguineous Sanguineous     No associated orders.     Wound / Incision (Open or Dehisced) 06/10/22 Other (Comment) Coccyx open wounds gluteal fold (Active)  Date First Assessed/Time First Assessed: 06/10/22 1901   Wound Type: Other (Comment)  Location: Coccyx  Wound Description (Comments): open wounds gluteal fold  Present on Admission: Yes    Assessments 06/11/2022  8:30 AM 06/12/2022 10:15 AM  Dressing Type None None  Site / Wound Assessment Bleeding Bleeding  Peri-wound Assessment Intact;Pink Intact;Pink  Drainage Amount Scant Scant  Drainage Description Sanguineous Sanguineous     No associated orders.    Discharge Exam:   Vitals:   06/11/22 2321 06/12/22 0438 06/12/22 0458 06/12/22 0723  BP: (!) 144/96 (!) 140/96  (!) 137/99  Pulse: 74 64  64  Resp: 20 20  17   Temp: 98.9 F (37.2 C) 98.4 F (36.9 C)  97.6 F (36.4 C)  TempSrc: Oral Oral    SpO2: 94% 96%  99%  Weight:   111.3 kg   Height:        Body mass index is 31.49 kg/m.   General exam: Pleasant middle-aged African-American male.  Not in physical distress Skin: No rashes, lesions or ulcers. HEENT: Atraumatic, normocephalic, no obvious bleeding Lungs: Clear to auscultation bilaterally CVS: Regular rate and rhythm, no murmur GI/Abd soft, nontender, nondistended, bowel sound present CNS: Alert, awake, oriented x3 Groin: Multiple wounds with minimal drainage in groin and buttocks Psychiatry: Mood appropriate Extremities: No pedal edema, no calf tenderness  Follow ups:    Follow-up Information     [facility] Follow up.   Specialty: Primary Care Contact information: [address] [place] [phone]        [facility] Follow up.   Contact information: [address] [place] [phone]        Dermatology, [place] .   Contact information: [address] [place] [address]         [phone], MD Follow up in 1 week(s).   Specialty: General Surgery Why: For possible I&D Contact information: [address] [place] [phone]         [phone], MD Follow up.   Specialty: General Surgery Contact information: [address] [place] [phone]                 Discharge Instructions:   Discharge Instructions     Call MD for:  difficulty breathing, headache or visual disturbances   Complete by: As directed    Call MD for:  extreme fatigue   Complete by: As directed    Call MD for:  hives   Complete by: As directed    Call MD for:  persistant dizziness or light-headedness   Complete by: As directed    Call MD for:  persistant nausea and vomiting   Complete by: As directed    Call MD for:  severe uncontrolled pain   Complete by: As directed    Call MD for:  temperature >100.4   Complete by: As directed    Diet general   Complete by: As directed    Discharge instructions  Complete by: As directed    Recommendations at discharge:   Complete 10 more days of oral Augmentin  Follow-up with general surgery as an outpatient  Referral given for dermatology for outpatient evaluation.  General discharge instructions: Follow with Primary MD Pcp, No in 7 days  Please request your PCP  to go over your hospital tests, procedures, radiology results at the follow up. Please get your medicines reviewed and adjusted.  Your PCP may decide to repeat certain labs or tests as needed. Do not drive, operate heavy machinery, perform activities at heights, swimming or participation in water activities or provide baby sitting services if your were admitted for syncope or siezures until you have seen by Primary MD or a Neurologist and advised to do so again. [place] Controlled Substance Reporting System database was reviewed. Do  not drive, operate heavy machinery, perform activities at heights, swim, participate in water activities or provide baby-sitting services while on medications for pain, sleep and mood until your outpatient physician has reevaluated you and advised to do so again.  You are strongly recommended to comply with the dose, frequency and duration of prescribed medications. Activity: As tolerated with Full fall precautions use walker/cane & assistance as needed Avoid using any recreational substances like cigarette, tobacco, alcohol, or non-prescribed drug. If you experience worsening of your admission symptoms, develop shortness of breath, life threatening emergency, suicidal or homicidal thoughts you must seek medical attention immediately by calling 911 or calling your MD immediately  if symptoms less severe. You must read complete instructions/literature along with all the possible adverse reactions/side effects for all the medicines you take and that have been prescribed to you. Take any new medicine only after you have completely understood and accepted all the possible adverse reactions/side effects.  Wear Seat belts while driving. You were cared for by a hospitalist during your hospital stay. If you have any questions about your discharge medications or the care you received while you were in the hospital after you are discharged, you can call the unit and ask to speak with the hospitalist or the covering physician. Once you are discharged, your primary care physician will handle any further medical issues. Please note that NO REFILLS for any discharge medications will be authorized once you are discharged, as it is imperative that you return to your primary care physician (or establish a relationship with a primary care physician if you do not have one).   Discharge wound care:   Complete by: As directed    Increase activity slowly   Complete by: As directed        Discharge Medications:    Allergies as of 06/12/2022   No Known Allergies      Medication List     STOP taking these medications    doxycycline 100 MG tablet Commonly known as: VIBRA-TABS       TAKE these medications    acetaminophen 325 MG tablet Commonly known as: TYLENOL Take 1-2 tablets (325-650 mg total) by mouth every 4 (four) hours as needed for mild pain.   amLODipine 10 MG tablet Commonly known as: NORVASC Take 1 tablet (10 mg total) by mouth once daily.   amoxicillin-clavulanate 875-125 MG tablet Commonly known as: AUGMENTIN Take 1 tablet by mouth 2 (two) times daily for 10 days.   atorvastatin 80 MG tablet Commonly known as: LIPITOR Take 1 tablet (80 mg total) by mouth once daily.   Baclofen 5 MG Tabs Take 1 tablet (5 mg) by  mouth three times daily.   CertaVite/Antioxidants Tabs Take 1 tablet by mouth daily.   clindamycin 1 % gel Commonly known as: Clindagel Apply topically 2 (two) times daily.   clopidogrel 75 MG tablet Commonly known as: PLAVIX Take 1 tablet (75 mg total) by mouth daily.   folic acid 1 MG tablet Commonly known as: FOLVITE Take 1 tablet (1 mg total) by mouth daily.   metoprolol succinate 50 MG 24 hr tablet Commonly known as: TOPROL-XL Take 1 tablet (50 mg total) by mouth daily. Take with or immediately following a meal.   nicotine 14 mg/24hr patch Commonly known as: NICODERM CQ - dosed in mg/24 hours Place 1 patch (14 mg total) onto the skin daily.   pantoprazole 40 MG tablet Commonly known as: PROTONIX Take 1 tablet (40 mg total) by mouth at bedtime.   saccharomyces boulardii 250 MG capsule Commonly known as: FLORASTOR Take 1 capsule (250 mg total) by mouth 2 (two) times daily.   Senexon-S 8.6-50 MG tablet Generic drug: senna-docusate Take 1 tablet by mouth 2 (two) times daily.   thiamine 100 MG tablet Take 1 tablet (100 mg total) by mouth daily.   traMADol 50 MG tablet Commonly known as: ULTRAM Take 1 tablet (50 mg total) by mouth  every 6 (six) hours as needed.               Discharge Care Instructions  (From admission, onward)           Start     Ordered   06/12/22 0000  Discharge wound care:        06/12/22 1346             The results of significant diagnostics from this hospitalization (including imaging, microbiology, ancillary and laboratory) are listed below for reference.    Procedures and Diagnostic Studies:   CT PELVIS W CONTRAST  Result Date: 06/10/2022 CLINICAL DATA:  56 year old male with hidradenitis, multiple draining areas in the groin, gluteal cleft. EXAM: CT PELVIS WITH CONTRAST TECHNIQUE: Multidetector CT imaging of the pelvis was performed using the standard protocol following the bolus administration of intravenous contrast. RADIATION DOSE REDUCTION: This exam was performed according to the departmental dose-optimization program which includes automated exposure control, adjustment of the mA and/or kV according to patient size and/or use of iterative reconstruction technique. CONTRAST:  OMNIPAQUE IOHEXOL 300 MG/ML  SOLN COMPARISON:  CT pelvis 06/04/2020. FINDINGS: Urinary Tract: Mild motion artifact. Negative right renal lower pole. Unremarkable bladder. Bowel: Rectum and anal verge appear within normal limits. No dilated large or small bowel in the lower abdomen. Normal appendix visible on series 2, image 17. Diverticulosis of the cecum and ascending colon. No free air or mesenteric inflammation. Vascular/Lymphatic: Aortoiliac calcified atherosclerosis. Tortuous distal aorta, bilateral iliac arteries. Major arterial structures appear to remain patent. Pelvic and inguinal lymph nodes appears stable since 2021, the largest again in proximity to the left inguinal canal measuring 17 mm on series 2, image 49. No cystic or necrotic nodes. Reproductive: No scrotal soft tissue gas or enlargement. Bulky skin and subcutaneous soft tissue thickening in the perineum eccentric to the left and  continuing into the medial left upper thigh (up to 19 mm on series 2, image 75. Involvement of the left greater than right gluteal clefts which are abnormally thickened up to 2.8 cm (image 71). And continued decubitus region skin and soft tissue thickening which tracks cephalad overlying the coccyx and sacrum greater on the left (up to 16  mm series 2, image 23). No associated organized or drainable fluid collection. The tip of the coccyx appears stable and intact. Other:  No pelvic free fluid.  Numerous pelvic phleboliths. Musculoskeletal: No acute osseous abnormality identified. Partially visible L3-L4 endplate and facet degeneration. Abnormal subcutaneous soft tissues described above. IMPRESSION: 1. Multifocal and contiguous skin and subcutaneous soft tissue thickening and inflammation extending from the sacral decubitus region through the bilateral gluteal cleft and perineum to the proximal thighs, greater on the left. No organized or drainable fluid collection at this time. No CT evidence of underlying osteomyelitis or other complicating features. 2. Reactive appearing pelvic and inguinal lymph nodes appear stable since 2021. 3. Aortic Atherosclerosis (ICD10-I70.0). Electronically Signed   By: Odessa FlemingH  Hall M.D.   On: 06/10/2022 11:27   CT Angio Chest PE W and/or Wo Contrast  Result Date: 06/09/2022 CLINICAL DATA:  Mid sternal chest pain EXAM: CT ANGIOGRAPHY CHEST WITH CONTRAST TECHNIQUE: Multidetector CT imaging of the chest was performed using the standard protocol during bolus administration of intravenous contrast. Multiplanar CT image reconstructions and MIPs were obtained to evaluate the vascular anatomy. RADIATION DOSE REDUCTION: This exam was performed according to the departmental dose-optimization program which includes automated exposure control, adjustment of the mA and/or kV according to patient size and/or use of iterative reconstruction technique. CONTRAST:  100mL OMNIPAQUE IOHEXOL 350 MG/ML SOLN  COMPARISON:  Chest x-ray 06/09/2022, CT chest 06/04/2020 FINDINGS: Cardiovascular: Satisfactory opacification of the pulmonary arteries to the segmental level. No evidence of pulmonary embolism. Mild atherosclerosis. No aneurysm. Coronary vascular calcification. Normal cardiac size. No pericardial effusion Mediastinum/Nodes: No enlarged mediastinal, hilar, or axillary lymph nodes. Thyroid gland, trachea, and esophagus demonstrate no significant findings. Lungs/Pleura: Lungs are clear. No pleural effusion or pneumothorax. Upper Abdomen: No acute abnormality. Musculoskeletal: Old left-sided rib fracture. Review of the MIP images confirms the above findings. IMPRESSION: 1. Negative for acute pulmonary embolus. 2. Clear lung fields Aortic Atherosclerosis (ICD10-I70.0). Electronically Signed   By: Jasmine PangKim  Fujinaga M.D.   On: 06/09/2022 19:31   DG Chest 2 View  Result Date: 06/09/2022 CLINICAL DATA:  cp/sob EXAM: CHEST - 2 VIEW COMPARISON:  08/07/2021. FINDINGS: No consolidation. No visible pleural effusions or pneumothorax. Cardiomediastinal silhouette is within normal limits. IMPRESSION: No active cardiopulmonary disease. Electronically Signed   By: Feliberto HartsFrederick S Jones M.D.   On: 06/09/2022 15:37     Labs:   Basic Metabolic Panel: Recent Labs  Lab 06/09/22 1508  NA 138  K 3.5  CL 105  CO2 24  GLUCOSE 117*  BUN 7  CREATININE 1.13  CALCIUM 8.6*   GFR Estimated Creatinine Clearance: 99.1 mL/min (by C-G formula based on SCr of 1.13 mg/dL). Liver Function Tests: Recent Labs  Lab 06/09/22 1813  AST 16  ALT 12  ALKPHOS 93  BILITOT 0.4  PROT 7.4  ALBUMIN 3.0*   Recent Labs  Lab 06/09/22 1813  LIPASE 29   No results for input(s): "AMMONIA" in the last 168 hours. Coagulation profile No results for input(s): "INR", "PROTIME" in the last 168 hours.  CBC: Recent Labs  Lab 06/09/22 1508  WBC 7.3  HGB 10.8*  HCT 32.7*  MCV 90.3  PLT 419*   Cardiac Enzymes: No results for input(s):  "CKTOTAL", "CKMB", "CKMBINDEX", "TROPONINI" in the last 168 hours. BNP: Invalid input(s): "POCBNP" CBG: No results for input(s): "GLUCAP" in the last 168 hours. D-Dimer Recent Labs    06/10/22 0009  DDIMER 0.43   Hgb A1c No results for input(s): "HGBA1C" in  the last 72 hours. Lipid Profile No results for input(s): "CHOL", "HDL", "LDLCALC", "TRIG", "CHOLHDL", "LDLDIRECT" in the last 72 hours. Thyroid function studies No results for input(s): "TSH", "T4TOTAL", "T3FREE", "THYROIDAB" in the last 72 hours.  Invalid input(s): "FREET3" Anemia work up Recent Labs    06/10/22 0009  VITAMINB12 365  FOLATE 16.2  FERRITIN 90  TIBC 189*  IRON 34*  RETICCTPCT 2.3   Microbiology Recent Results (from the past 240 hour(s))  SARS Coronavirus 2 by RT PCR (hospital order, performed in [facility] hospital lab) *cepheid single result test* Anterior Nasal Swab     Status: None   Collection Time: 06/09/22  8:47 PM   Specimen: Anterior Nasal Swab  Result Value Ref Range Status   SARS Coronavirus 2 by RT PCR NEGATIVE NEGATIVE Final    Comment: (NOTE) SARS-CoV-2 target nucleic acids are NOT DETECTED.  The SARS-CoV-2 RNA is generally detectable in upper and lower respiratory specimens during the acute phase of infection. The lowest concentration of SARS-CoV-2 viral copies this assay can detect is 250 copies / mL. A negative result does not preclude SARS-CoV-2 infection and should not be used as the sole basis for treatment or other patient management decisions.  A negative result may occur with improper specimen collection / handling, submission of specimen other than nasopharyngeal swab, presence of viral mutation(s) within the areas targeted by this assay, and inadequate number of viral copies (<250 copies / mL). A negative result must be combined with clinical observations, patient history, and epidemiological information.  Fact Sheet for Patients:    RoadLapTop.co.za  Fact Sheet for Healthcare Providers: http://kim-miller.com/  This test is not yet approved or  cleared by the [place] FDA and has been authorized for detection and/or diagnosis of SARS-CoV-2 by FDA under an Emergency Use Authorization (EUA).  This EUA will remain in effect (meaning this test can be used) for the duration of the COVID-19 declaration under Section 564(b)(1) of the Act, 21 U.S.C. section 360bbb-3(b)(1), unless the authorization is terminated or revoked sooner.  Performed at [facility], [address], [place], [place] [address]     Time coordinating discharge: 35 minutes  Signed: Lorin Glass  [facility] 06/12/2022, 1:46 PM

## 2022-06-12 NOTE — TOC Initial Note (Signed)
Transition of Care [facility]) - Initial/Assessment Note    Patient Details  Name: James Lambert MRN: [id number] Date of Birth: Aug 05, 1966  [facility] CM/SW Contact:    Chapman Fitch, RN Phone Number: 06/12/2022, 8:20 PM  Clinical Narrative:                      Late entry.  Patient lives at home with brother.  States that brother will be providing transportation at discharge.  Patient in the process of obtaining medicated - provided patient information on [facility] for [facility] for [facility] patients, [facility]  application, [facility] information.  Patient states that he has a RW at home Medications were delivered from [facility] outpatient pharmacy    Patient Goals and CMS Choice        Expected Discharge Plan and Services           Expected Discharge Date: 06/12/22                                    Prior Living Arrangements/Services                       Activities of Daily Living Home Assistive Devices/Equipment: Dan Humphreys (specify type) ADL Screening (condition at time of admission) Patient's cognitive ability adequate to safely complete daily activities?: Yes Is the patient deaf or have difficulty hearing?: No Does the patient have difficulty seeing, even when wearing glasses/contacts?: No Does the patient have difficulty concentrating, remembering, or making decisions?: No Patient able to express need for assistance with ADLs?: Yes Does the patient have difficulty dressing or bathing?: Yes Independently performs ADLs?: No Communication: Independent Dressing (OT): Needs assistance Is this a change from baseline?: Pre-admission baseline Grooming: Needs assistance Is this a change from baseline?: Pre-admission baseline Feeding: Needs assistance Is this a change from baseline?: Pre-admission baseline Bathing: Needs assistance Is this a change from baseline?: Pre-admission baseline Toileting: Needs assistance Is this a change  from baseline?: Pre-admission baseline In/Out Bed: Needs assistance Is this a change from baseline?: Pre-admission baseline Walks in Home: Needs assistance Is this a change from baseline?: Pre-admission baseline Does the patient have difficulty walking or climbing stairs?: Yes Weakness of Legs: Right Weakness of Arms/Hands: Right  Permission Sought/Granted                  Emotional Assessment              Admission diagnosis:  Chest pain [R07.9] Chest pain with high risk for cardiac etiology [R07.9] Patient Active Problem List   Diagnosis Date Noted   Chest pain 06/09/2022   Ischemic stroke of frontal lobe (HCC) 05/16/2022   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 ([address]), [place] - [address] [place]) [place] [address] Phone: [phone] Fax: [phone]  [facility] [address] [place] [address] Phone: [phone] Fax: [phone]  Social Determinants of Health (SDOH) Interventions    Readmission Risk Interventions     No data to display           

## 2022-06-12 NOTE — Progress Notes (Signed)
Discharge instructions (including medications and follow up appointments ) discussed with and copy provided to patient/caregiver  Medications delivered to bedside

## 2022-06-12 NOTE — Progress Notes (Signed)
Physical Therapy Treatment Patient Details Name: James Lambert MRN: [id number] DOB: 10-26-1966 Today's Date: 06/12/2022   History of Present Illness James Lambert is a 56 y.o. male   with complex past medical history including recent stroke status post thrombectomy, despite being on anticoagulation, here with intermittent chest pain.  The patient states that for the last week, but particular the last day, he has had intermittent, aching, pressure-like, chest pain with associated nausea.  He states he feels mildly short of breath with these.  They seem to come and go and are increasingly frequent.  Denies known history of coronary disease.  As mentioned, he just was admitted and had a thrombectomy for a large vessel occlusion.    PT Comments    Pt received supine in bed agreeable to PT. Remains mod-I with bed mobility and standing without AD with supervision. Pt ambulating to bathroom reporting need for BM. Pt able to perform all hygiene independently with PT assist in gown management. Pt progressing gait to ~250' with RW. Throughout bout pt requiring multimodal cuing for upright posture and gait mechanics as pt relying on circumductory gait on RLE during swing phase. Pt endorsing due to chronic knee pain and not deficits from prior CVA. With frequent VC's pt able to perform normal step through gait with hip/knee flexion in sagittal plane but reports increased knee pain during. Safe completion of stairs with rails performed indicating pt appropriate to d/c home. Pt returning to room in recliner with attending MD and RN at bedside. Continue to rec OPPT at discharge to improve dynamic balance and gait to optimize function and decrease risk of falls.     Recommendations for follow up therapy are one component of a multi-disciplinary discharge planning process, led by the attending physician.  Recommendations may be updated based on patient status, additional functional criteria and insurance  authorization.  Follow Up Recommendations  Outpatient PT     Assistance Recommended at Discharge PRN  Patient can return home with the following Assist for transportation   Equipment Recommendations  None recommended by PT    Recommendations for Other Services       Precautions / Restrictions Precautions Precautions: Fall Restrictions Weight Bearing Restrictions: No     Mobility  Bed Mobility Overal bed mobility: Modified Independent             General bed mobility comments: bed features Patient Response: Cooperative  Transfers Overall transfer level: Needs assistance Equipment used: None Transfers: Sit to/from Stand Sit to Stand: Supervision           General transfer comment: Once standing relied with SUE on counter to attain upright standing    Ambulation/Gait Ambulation/Gait assistance: Supervision Gait Distance (Feet): 250 Feet Assistive device: Rolling walker (2 wheels) Gait Pattern/deviations: Step-through pattern, Decreased step length - right, Decreased step length - left, Knee hyperextension - right       General Gait Details: Decreased swing phase on RLE relying on circumduction gait to progress RLE during seing phase due to chronic knee pain. WIth VC's able to perform with hip/knee flexion but more painful to pt.   Stairs Stairs: Yes Stairs assistance: Min guard Stair Management: Two rails, Step to pattern, Alternating pattern, Forwards Number of Stairs: 6 General stair comments: asc/desc with alternating pattern ascending and step to pattern with descending.   Wheelchair Mobility    Modified Rankin (Stroke Patients Only)       Balance Overall balance assessment: Needs assistance Sitting-balance support: No upper  extremity supported, Feet supported Sitting balance-Leahy Scale: Good       Standing balance-Leahy Scale: Good Standing balance comment: Demonstrating ability to perform pericare in standing without UE support                             Cognition Arousal/Alertness: Awake/alert Behavior During Therapy: WFL for tasks assessed/performed Overall Cognitive Status: Within Functional Limits for tasks assessed                                          Exercises      General Comments        Pertinent Vitals/Pain Pain Assessment Pain Assessment: Faces Faces Pain Scale: Hurts little more Pain Location: buttock wounds Pain Descriptors / Indicators: Discomfort, Grimacing Pain Intervention(s): Limited activity within patient's tolerance, Monitored during session, Repositioned    Home Living                          Prior Function            PT Goals (current goals can now be found in the care plan section) Acute Rehab PT Goals Patient Stated Goal: to improve balance and walking capabilities PT Goal Formulation: With patient Time For Goal Achievement: 06/25/22 Potential to Achieve Goals: Good Progress towards PT goals: Progressing toward goals    Frequency    Min 2X/week      PT Plan Current plan remains appropriate    Co-evaluation              AM-PAC PT "6 Clicks" Mobility   Outcome Measure  Help needed turning from your back to your side while in a flat bed without using bedrails?: A Little Help needed moving from lying on your back to sitting on the side of a flat bed without using bedrails?: A Little Help needed moving to and from a bed to a chair (including a wheelchair)?: A Little Help needed standing up from a chair using your arms (e.g., wheelchair or bedside chair)?: A Little Help needed to walk in hospital room?: A Little Help needed climbing 3-5 steps with a railing? : A Lot 6 Click Score: 17    End of Session Equipment Utilized During Treatment: Gait belt Activity Tolerance: Patient tolerated treatment well Patient left: in chair;with call bell/phone within reach;with chair alarm set;with nursing/sitter in room Nurse  Communication: Mobility status PT Visit Diagnosis: Other abnormalities of gait and mobility (R26.89);Muscle weakness (generalized) (M62.81)     Time: 6073-7106 PT Time Calculation (min) (ACUTE ONLY): 24 min  Charges:  $Therapeutic Exercise: 23-37 mins                     Jenessa Gillingham M. Fairly IV, PT, DPT Physical Therapist- [place]  [facility]  06/12/2022, 10:36 AM

## 2022-06-12 NOTE — TOC Progression Note (Signed)
Transition of Care [facility]) - Progression Note    Patient Details  Name: EZRAEL SAM MRN: [id number] Date of Birth: 1966/03/04  [facility] CM/SW Contact  Truddie Hidden, RN Phone Number: 06/12/2022, 3:02 PM  Clinical Narrative:    Sherron Monday with [employer] [facility] at the [employer]. Medications are being prepared. Volunteer services contacted to have medications picked up and delivered to patient room       Expected Discharge Plan and Services           Expected Discharge Date: 06/12/22                                     Social Determinants of Health (SDOH) Interventions    Readmission Risk Interventions     No data to display

## 2022-06-19 ENCOUNTER — Encounter: Payer: Self-pay | Admitting: Family Medicine

## 2022-06-19 ENCOUNTER — Other Ambulatory Visit: Payer: Self-pay

## 2022-06-19 ENCOUNTER — Ambulatory Visit: Payer: Medicaid Other | Attending: Family Medicine | Admitting: Family Medicine

## 2022-06-19 VITALS — BP 138/82 | HR 69 | Temp 98.0°F | Ht 74.0 in | Wt 254.6 lb

## 2022-06-19 DIAGNOSIS — J449 Chronic obstructive pulmonary disease, unspecified: Secondary | ICD-10-CM | POA: Diagnosis not present

## 2022-06-19 DIAGNOSIS — I10 Essential (primary) hypertension: Secondary | ICD-10-CM | POA: Diagnosis not present

## 2022-06-19 DIAGNOSIS — Z1159 Encounter for screening for other viral diseases: Secondary | ICD-10-CM

## 2022-06-19 DIAGNOSIS — L732 Hidradenitis suppurativa: Secondary | ICD-10-CM | POA: Diagnosis not present

## 2022-06-19 DIAGNOSIS — I69351 Hemiplegia and hemiparesis following cerebral infarction affecting right dominant side: Secondary | ICD-10-CM

## 2022-06-19 MED ORDER — VITAMIN B-1 100 MG PO TABS
100.0000 mg | ORAL_TABLET | Freq: Every day | ORAL | 1 refills | Status: DC
  Filled 2022-06-19: qty 30, 30d supply, fill #0
  Filled 2022-06-20: qty 20, 20d supply, fill #0
  Filled 2022-07-14: qty 30, 30d supply, fill #1

## 2022-06-19 MED ORDER — AMLODIPINE BESYLATE 10 MG PO TABS
10.0000 mg | ORAL_TABLET | Freq: Every day | ORAL | 0 refills | Status: DC
  Filled 2022-06-19 – 2022-06-20 (×2): qty 30, 30d supply, fill #0

## 2022-06-19 MED ORDER — FOLIC ACID 1 MG PO TABS
1.0000 mg | ORAL_TABLET | Freq: Every day | ORAL | 0 refills | Status: DC
  Filled 2022-06-19 – 2022-06-20 (×2): qty 30, 30d supply, fill #0

## 2022-06-19 MED ORDER — CLOPIDOGREL BISULFATE 75 MG PO TABS
75.0000 mg | ORAL_TABLET | Freq: Every day | ORAL | 0 refills | Status: DC
  Filled 2022-06-19 – 2022-06-20 (×2): qty 30, 30d supply, fill #0

## 2022-06-19 MED ORDER — ATORVASTATIN CALCIUM 80 MG PO TABS
80.0000 mg | ORAL_TABLET | Freq: Every day | ORAL | 0 refills | Status: DC
  Filled 2022-06-19 – 2022-06-25 (×3): qty 30, 30d supply, fill #0

## 2022-06-19 MED ORDER — METOPROLOL SUCCINATE ER 50 MG PO TB24
50.0000 mg | ORAL_TABLET | Freq: Every day | ORAL | 0 refills | Status: DC
  Filled 2022-06-19 – 2022-06-20 (×2): qty 30, 30d supply, fill #0

## 2022-06-19 NOTE — Progress Notes (Signed)
Recurrent boils. Medication refill.

## 2022-06-19 NOTE — Patient Instructions (Signed)
Hidradenitis Suppurativa Hidradenitis suppurativa is a long-term (chronic) skin disease. It is similar to a severe form of acne, but it affects areas of the body where acne would be unusual, especially areas of the body where skin rubs against skin and becomes moist. These include: Underarms. Groin. Genital area. Buttocks. Upper thighs. Breasts. Hidradenitis suppurativa may start out as small lumps or pimples caused by blocked sweat glands or hair follicles. Pimples may develop into deep sores that break open (rupture) and drain pus. Over time, affected areas of skin may thicken and become scarred. This condition is rare and does not spread from person to person (non-contagious). What are the causes? The exact cause of this condition is not known. It may be related to: Male and male hormones. An overactive disease-fighting system (immune system). The immune system may over-react to blocked hair follicles or sweat glands and cause swelling and pus-filled sores. What increases the risk? You are more likely to develop this condition if you: Are male. Are 11-55 years old. Have a family history of hidradenitis suppurativa. Have a personal history of acne. Are overweight. Smoke. Take the medicine lithium. What are the signs or symptoms? The first symptoms are usually painful bumps in the skin, similar to pimples. The condition may get worse over time (progress), or it may only cause mild symptoms. If the disease progresses, symptoms may include: Skin bumps getting bigger and growing deeper into the skin. Bumps rupturing and draining pus. Itchy, infected skin. Skin getting thicker and scarred. Tunnels under the skin (fistulas) where pus drains from a bump. Pain during daily activities, such as pain during walking if your groin area is affected. Emotional problems, such as stress or depression. This condition may affect your appearance and your ability or willingness to wear certain clothes  or do certain activities. How is this diagnosed? This condition is diagnosed by a health care provider who specializes in skin diseases (dermatologist). You may be diagnosed based on: Your symptoms and medical history. A physical exam. Testing a pus sample for infection. Blood tests. How is this treated? Your treatment will depend on how severe your symptoms are. The same treatment will not work for everybody with this condition. You may need to try several treatments to find what works best for you. Treatment may include: Cleaning and bandaging (dressing) your wounds as needed. Lifestyle changes, such as new skin care routines. Taking medicines, such as: Antibiotics. Acne medicines. Medicines to reduce the activity of the immune system. A diabetes medicine (metformin). Birth control pills, for women. Steroids to reduce swelling and pain. Working with a mental health care provider, if you experience emotional distress due to this condition. If you have severe symptoms that do not get better with medicine, you may need surgery. Surgery may involve: Using a laser to clear the skin and remove hair follicles. Opening and draining deep sores. Removing the areas of skin that are diseased and scarred. Follow these instructions at home: Medicines  Take over-the-counter and prescription medicines only as told by your health care provider. If you were prescribed an antibiotic medicine, take it as told by your health care provider. Do not stop taking the antibiotic even if your condition improves. Skin care If you have open wounds, cover them with a clean dressing as told by your health care provider. Keep wounds clean by washing them gently with soap and water when you bathe. Do not shave the areas where you get hidradenitis suppurativa. Do not wear deodorant. Wear loose-fitting   clothes. Try to avoid getting overheated or sweaty. If you get sweaty or wet, change into clean, dry clothes as soon  as you can. To help relieve pain and itchiness, cover sore areas with a warm, clean washcloth (warm compress) for 5-10 minutes as often as needed. If told by your health care provider, take a bleach bath twice a week: Fill your bathtub halfway with water. Pour in  cup of unscented household bleach. Soak in the tub for 5-10 minutes. Only soak from the neck down. Avoid water on your face and hair. Shower to rinse off the bleach from your skin. General instructions Learn as much as you can about your disease so that you have an active role in your treatment. Work closely with your health care provider to find treatments that work for you. If you are overweight, work with your health care provider to lose weight as recommended. Do not use any products that contain nicotine or tobacco, such as cigarettes and e-cigarettes. If you need help quitting, ask your health care provider. If you struggle with living with this condition, talk with your health care provider or work with a mental health care provider as recommended. Keep all follow-up visits as told by your health care provider. This is important. Where to find more information Hidradenitis Suppurativa Foundation, Inc.: https://www.hs-foundation.org/ American Academy of Dermatology: https://www.aad.org Contact a health care provider if you have: A flare-up of hidradenitis suppurativa. A fever or chills. Trouble controlling your symptoms at home. Trouble doing your daily activities because of your symptoms. Trouble dealing with emotional problems related to your condition. Summary Hidradenitis suppurativa is a long-term (chronic) skin disease. It is similar to a severe form of acne, but it affects areas of the body where acne would be unusual. The first symptoms are usually painful bumps in the skin, similar to pimples. The condition may only cause mild symptoms, or it may get worse over time (progress). If you have open wounds, cover them  with a clean dressing as told by your health care provider. Keep wounds clean by washing them gently with soap and water when you bathe. Besides skin care, treatment may include medicines, laser treatment, and surgery. This information is not intended to replace advice given to you by your health care provider. Make sure you discuss any questions you have with your health care provider. Document Revised: 09/18/2020 Document Reviewed: 09/18/2020 [employer] Patient Education  2023 [employer]  

## 2022-06-19 NOTE — Progress Notes (Signed)
Subjective:  Patient ID: HONOR FAIRBANK, male    DOB: August 25, 1966  Age: 56 y.o. MRN: [id number]  CC: Hospitalization Follow-up   HPI IHAN PAT is a 56 y.o. 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 Recently hospitalized 7/3 through 06/12/22 for chest pain.  Ruled out for ACS with negative troponins, unremarkable EKG, negative stress test. Seen by general surgery due to hidradenitis and placed on Augmentin which he was discharged on.   Interval History: He presents today stating he is doing well.  He has an upcoming appointment in 4 days to be established [facility] Currently ambulates with the aid of a walker and does have some slight residual right sided weakness. He has run out of a couple of his medications and needs refill.  Wound care clinic appointment for evaluation of his hidradenitis comes up next week, general surgery appointment and neurology appointment, up next month. General surgery  With regards to his history of multiple CVAs he does endorse a history of alcohol and nicotine dependence as possible etiologies.  He has a Fhx of strokes in his Dad and brother.  No hypercoagulable work-up available on file. D-dimer was normal, CTA negative for PE. Echo from 05/2022 revealed EF of 60 to 65%, no regional wall motion abnormalities,  He is adherent with his Plavix. Denies acute concerns today.   Past Medical History:  Diagnosis Date   Asthma    COPD (chronic obstructive pulmonary disease) (HCC)    Hidradenitis suppurativa    diagnosed in [facility] ED based on history and physical exam   Hypertension     Past Surgical History:  Procedure Laterality Date   IR CT HEAD LTD  05/10/2022   IR FLUORO GUIDED NEEDLE PLC  ASPIRATION/INJECTION LOC  03/20/2022   IR PERCUTANEOUS ART THROMBECTOMY/INFUSION INTRACRANIAL INC DIAG ANGIO  05/10/2022   IR US GUIDE VASC ACCESS RIGHT  05/10/2022   RADIOLOGY WITH ANESTHESIA N/A 05/10/2022   Procedure: IR WITH ANESTHESIA;  Surgeon: Julieanne Cotton, MD;  Location: [facility] OR;  Service: Radiology;  Laterality: N/A;   TEE WITHOUT CARDIOVERSION N/A 03/11/2022   Procedure: TRANSESOPHAGEAL ECHOCARDIOGRAM (TEE);  Surgeon: Lamar Blinks, MD;  Location: [facility] ORS;  Service: Cardiovascular;  Laterality: N/A;    History reviewed. No pertinent family history.  Social History   Socioeconomic History   Marital status: Single    Spouse name: Not on file   Number of children: Not on file   Years of education: Not on file   Highest education level: Not on file  Occupational History   Not on file  Tobacco Use   Smoking status: Former    Packs/day: 0.50    Types: Cigarettes   Smokeless tobacco: Never  Vaping Use   Vaping Use: Never used  Substance and Sexual Activity   Alcohol use: Yes    Comment: daily   Drug use: Yes    Types: Marijuana   Sexual activity: Not on file  Other Topics Concern   Not on file  Social History Narrative   Not on file   Social Determinants of Health   Financial Resource Strain: Not on file  Food Insecurity: Not on file  Transportation Needs: Not on file  Physical Activity: Not on file  Stress: Not  on file  Social Connections: Not on file    No Known Allergies  Outpatient Medications Prior to Visit  Medication Sig Dispense Refill   acetaminophen (TYLENOL) 325 MG tablet Take 1-2 tablets (325-650 mg total) by mouth every 4 (four) hours as needed for mild pain.     amoxicillin-clavulanate (AUGMENTIN) 875-125 MG tablet Take 1 tablet by mouth 2 (two) times daily for 10 days. 20 tablet 0   Baclofen 5 MG TABS Take 1 tablet (5 mg) by mouth three times daily. 90 tablet 0   clindamycin (CLINDAGEL) 1 % gel Apply topically 2 (two) times daily. 60 g 0    Multiple Vitamin (MULTIVITAMIN WITH MINERALS) TABS tablet Take 1 tablet by mouth daily. 30 tablet 0   nicotine (NICODERM CQ - DOSED IN MG/24 HOURS) 14 mg/24hr patch Place 1 patch (14 mg total) onto the skin daily. 28 patch 0   pantoprazole (PROTONIX) 40 MG tablet Take 1 tablet (40 mg total) by mouth at bedtime. 30 tablet 0   saccharomyces boulardii (FLORASTOR) 250 MG capsule Take 1 capsule (250 mg total) by mouth 2 (two) times daily. 60 capsule 0   senna-docusate (SENOKOT-S) 8.6-50 MG tablet Take 1 tablet by mouth 2 (two) times daily. 30 tablet 1   traMADol (ULTRAM) 50 MG tablet Take 1 tablet (50 mg total) by mouth every 6 (six) hours as needed. 30 tablet 0   amLODipine (NORVASC) 10 MG tablet Take 1 tablet (10 mg total) by mouth once daily. 30 tablet 0   atorvastatin (LIPITOR) 80 MG tablet Take 1 tablet (80 mg total) by mouth once daily. 30 tablet 0   clopidogrel (PLAVIX) 75 MG tablet Take 1 tablet (75 mg total) by mouth daily. 30 tablet 0   folic acid (FOLVITE) 1 MG tablet Take 1 tablet (1 mg total) by mouth daily. 30 tablet 0   metoprolol succinate (TOPROL-XL) 50 MG 24 hr tablet Take 1 tablet (50 mg total) by mouth daily. Take with or immediately following a meal. 30 tablet 0   thiamine 100 MG tablet Take 1 tablet (100 mg total) by mouth daily. 30 tablet 1   No facility-administered medications prior to visit.     ROS Review of Systems  Constitutional:  Negative for activity change and appetite change.  HENT:  Negative for sinus pressure and sore throat.   Eyes:  Negative for visual disturbance.  Respiratory:  Negative for cough, chest tightness and shortness of breath.   Cardiovascular:  Negative for chest pain and leg swelling.  Gastrointestinal:  Negative for abdominal distention, abdominal pain, constipation and diarrhea.  Endocrine: Negative.   Genitourinary:  Negative for dysuria.  Musculoskeletal:  Positive for gait problem. Negative for joint swelling and myalgias.  Skin:   Positive for rash.  Allergic/Immunologic: Negative.   Neurological:  Positive for weakness. Negative for light-headedness and numbness.  Psychiatric/Behavioral:  Negative for dysphoric mood and suicidal ideas.     Objective:  BP 138/82   Pulse 69   Temp 98 F (36.7 C) (Oral)   Ht 6\' 2"  (1.88 m)   Wt 254 lb 9.6 oz (115.5 kg)   SpO2 100%   BMI 32.69 kg/m      06/19/2022    9:50 AM 06/12/2022    7:23 AM 06/12/2022    4:58 AM  BP/Weight  Systolic BP 138 137   Diastolic BP 82 99   Wt. (Lbs) 254.6  245.3  BMI 32.69 kg/m2  31.49 kg/m2      Physical  Exam Constitutional:      Appearance: He is well-developed.  Cardiovascular:     Rate and Rhythm: Normal rate.     Heart sounds: Normal heart sounds. No murmur heard. Pulmonary:     Effort: Pulmonary effort is normal.     Breath sounds: Normal breath sounds. No wheezing or rales.  Chest:     Chest wall: No tenderness.  Abdominal:     General: Bowel sounds are normal. There is no distension.     Palpations: Abdomen is soft. There is no mass.     Tenderness: There is no abdominal tenderness.  Musculoskeletal:        General: Normal range of motion.     Right lower leg: No edema.     Left lower leg: No edema.  Neurological:     Mental Status: He is alert and oriented to person, place, and time.     Comments: Strength in right upper and right lower extremity-4+/5 Strength in left upper and left lower extremity-5/5  Psychiatric:        Mood and Affect: Mood normal.        Latest Ref Rng & Units 06/09/2022    6:13 PM 06/09/2022    3:08 PM 05/26/2022    6:15 AM  CMP  Glucose 70 - 99 mg/dL  619  97   BUN 6 - 20 mg/dL  7  19   Creatinine 5.09 - 1.24 mg/dL  3.26  7.12   Sodium 458 - 145 mmol/L  138  135   Potassium 3.5 - 5.1 mmol/L  3.5  4.2   Chloride 98 - 111 mmol/L  105  98   CO2 22 - 32 mmol/L  24  23   Calcium 8.9 - 10.3 mg/dL  8.6  9.5   Total Protein 6.5 - 8.1 g/dL 7.4     Total Bilirubin 0.3 - 1.2 mg/dL 0.4      Alkaline Phos 38 - 126 U/L 93     AST 15 - 41 U/L 16     ALT 0 - 44 U/L 12       Lipid Panel     Component Value Date/Time   CHOL 140 05/11/2022 0249   TRIG 87 05/11/2022 0249   HDL 32 (L) 05/11/2022 0249   CHOLHDL 4.4 05/11/2022 0249   VLDL 17 05/11/2022 0249   LDLCALC 91 05/11/2022 0249    CBC    Component Value Date/Time   WBC 7.3 06/09/2022 1508   RBC 3.37 (L) 06/10/2022 0009   RBC 3.62 (L) 06/09/2022 1508   HGB 10.8 (L) 06/09/2022 1508   HCT 32.7 (L) 06/09/2022 1508   PLT 419 (H) 06/09/2022 1508   MCV 90.3 06/09/2022 1508   MCH 29.8 06/09/2022 1508   MCHC 33.0 06/09/2022 1508   RDW 13.2 06/09/2022 1508   LYMPHSABS 1.9 05/17/2022 0503   MONOABS 0.7 05/17/2022 0503   EOSABS 0.4 05/17/2022 0503   BASOSABS 0.0 05/17/2022 0503    Lab Results  Component Value Date   HGBA1C 5.3 03/19/2022    Assessment & Plan:  1. Hypertension, unspecified type Controlled Continue current antihypertensive - metoprolol succinate (TOPROL-XL) 50 MG 24 hr tablet; Take 1 tablet (50 mg total) by mouth daily. Take with or immediately following a meal.  Dispense: 30 tablet; Refill: 0 - amLODipine (NORVASC) 10 MG tablet; Take 1 tablet (10 mg total) by mouth once daily.  Dispense: 30 tablet; Refill: 0  2. Chronic obstructive pulmonary disease, unspecified  COPD type (HCC) Stable with no exacerbations Continue albuterol MDI as needed  3. Hemiparesis affecting right side as late effect of stroke (HCC) History of CVA x3 the last 3 months We will need to evaluate for hypercoagulable disorder Risk factor modification Continue Plavix He will probably need a loop recorder.  He sees his primary care next week and I will defer to them to place referral for him Follow-up with neurology - Protein C, total - Protein S, total and free - Factor 2 assay - Beta 2 microglobulin, serum - Homocysteine - Factor V Leiden - Lupus anticoagulant panel - atorvastatin (LIPITOR) 80 MG tablet; Take 1 tablet  (80 mg total) by mouth once daily.  Dispense: 30 tablet; Refill: 0  4. Hydradenitis He declines skin exam Currently on Augmentin Has upcoming appointment with wound care next week  5. Need for hepatitis C screening test - HCV Ab w Reflex to Quant PCR   Meds ordered this encounter  Medications   atorvastatin (LIPITOR) 80 MG tablet    Sig: Take 1 tablet (80 mg total) by mouth once daily.    Dispense:  30 tablet    Refill:  0   metoprolol succinate (TOPROL-XL) 50 MG 24 hr tablet    Sig: Take 1 tablet (50 mg total) by mouth daily. Take with or immediately following a meal.    Dispense:  30 tablet    Refill:  0   amLODipine (NORVASC) 10 MG tablet    Sig: Take 1 tablet (10 mg total) by mouth once daily.    Dispense:  30 tablet    Refill:  0   thiamine 100 MG tablet    Sig: Take 1 tablet (100 mg total) by mouth daily.    Dispense:  30 tablet    Refill:  1   clopidogrel (PLAVIX) 75 MG tablet    Sig: Take 1 tablet (75 mg total) by mouth daily.    Dispense:  30 tablet    Refill:  0   folic acid (FOLVITE) 1 MG tablet    Sig: Take 1 tablet (1 mg total) by mouth daily.    Dispense:  30 tablet    Refill:  0    Follow-up: Return for Medical conditions, please keep appointment [facility] [place] [facility]CP.  He will need cardiology referral for loop recorder placement but I will defer this to his PCP     Hoy Register, MD, FAAFP. [facility] [place], [place] [phone]   06/20/2022, 9:43 AM

## 2022-06-20 ENCOUNTER — Other Ambulatory Visit (HOSPITAL_COMMUNITY): Payer: Self-pay

## 2022-06-20 ENCOUNTER — Other Ambulatory Visit: Payer: Self-pay

## 2022-06-20 ENCOUNTER — Other Ambulatory Visit: Payer: Self-pay | Admitting: Physician Assistant

## 2022-06-20 ENCOUNTER — Encounter: Payer: Self-pay | Admitting: Family Medicine

## 2022-06-20 MED FILL — Pantoprazole Sodium EC Tab 40 MG (Base Equiv): ORAL | 30 days supply | Qty: 30 | Fill #0 | Status: AC

## 2022-06-23 ENCOUNTER — Encounter: Payer: Self-pay | Admitting: Surgery

## 2022-06-23 ENCOUNTER — Other Ambulatory Visit: Payer: Self-pay

## 2022-06-23 ENCOUNTER — Ambulatory Visit (INDEPENDENT_AMBULATORY_CARE_PROVIDER_SITE_OTHER): Payer: Medicaid Other | Admitting: Surgery

## 2022-06-23 VITALS — BP 119/86 | HR 76 | Temp 98.7°F | Ht 74.0 in | Wt 254.2 lb

## 2022-06-23 DIAGNOSIS — L732 Hidradenitis suppurativa: Secondary | ICD-10-CM

## 2022-06-23 MED ORDER — AMOXICILLIN-POT CLAVULANATE 875-125 MG PO TABS
1.0000 | ORAL_TABLET | Freq: Two times a day (BID) | ORAL | 0 refills | Status: DC
  Filled 2022-06-23 – 2022-06-24 (×2): qty 28, 14d supply, fill #0

## 2022-06-23 NOTE — Progress Notes (Signed)
06/23/2022  History of Present Illness: James Lambert is a 56 y.o. male presenting for follow up of hidradenitis.  The patient was seen in the hospital for worsening infection of hidradenitis of the left buttocks, perineum, and inner thigh.  The area involved was so extensive that it was opted to treat conservatively with antibiotics in order to prevent having to do so many incisions that would need packing or wound care, and the patient does not have assistance for this at home.  He also has a recent history of a CVA that required thrombectomy and is currently on Plavix.  He was discharged on 06/12/22 and presents for follow up.  He reports that his pain has improved, but he's still having drainage.  Denies any worsening pain, fevers, chills.  He just completed an antibiotic course today.    Past Medical History: Past Medical History:  Diagnosis Date   Asthma    COPD (chronic obstructive pulmonary disease) (HCC)    Hidradenitis suppurativa    diagnosed in [facility] ED based on history and physical exam   Hypertension      Past Surgical History: Past Surgical History:  Procedure Laterality Date   IR CT HEAD LTD  05/10/2022   IR FLUORO GUIDED NEEDLE PLC ASPIRATION/INJECTION LOC  03/20/2022   IR PERCUTANEOUS ART THROMBECTOMY/INFUSION INTRACRANIAL INC DIAG ANGIO  05/10/2022   IR US GUIDE VASC ACCESS RIGHT  05/10/2022   RADIOLOGY WITH ANESTHESIA N/A 05/10/2022   Procedure: IR WITH ANESTHESIA;  Surgeon: Julieanne Cotton, MD;  Location: [facility] OR;  Service: Radiology;  Laterality: N/A;   TEE WITHOUT CARDIOVERSION N/A 03/11/2022   Procedure: TRANSESOPHAGEAL ECHOCARDIOGRAM (TEE);  Surgeon: Lamar Blinks, MD;  Location: [facility] ORS;  Service: Cardiovascular;  Laterality: N/A;    Home Medications: Prior to Admission medications   Medication Sig Start Date End Date Taking? Authorizing Provider  acetaminophen (TYLENOL) 325 MG tablet Take 1-2 tablets (325-650 mg total) by mouth every 4 (four) hours as needed for  mild pain. 05/27/22  Yes Setzer, Lynnell Jude, PA-C  amLODipine (NORVASC) 10 MG tablet Take 1 tablet (10 mg total) by mouth once daily. 06/19/22  Yes Hoy Register, MD  atorvastatin (LIPITOR) 80 MG tablet Take 1 tablet (80 mg total) by mouth once daily. 06/19/22  Yes Hoy Register, MD  Baclofen 5 MG TABS Take 1 tablet (5 mg) by mouth three times daily. 05/27/22  Yes Setzer, Lynnell Jude, PA-C  clindamycin (CLINDAGEL) 1 % gel Apply topically 2 (two) times daily. 06/12/22 07/12/22 Yes Dahal, Melina Schools, MD  clopidogrel (PLAVIX) 75 MG tablet Take 1 tablet (75 mg total) by mouth daily. 06/19/22  Yes Hoy Register, MD  folic acid (FOLVITE) 1 MG tablet Take 1 tablet (1 mg total) by mouth daily. 06/19/22  Yes Hoy Register, MD  metoprolol succinate (TOPROL-XL) 50 MG 24 hr tablet Take 1 tablet (50 mg total) by mouth daily. Take with or immediately following a meal. 06/19/22  Yes Newlin, Odette Horns, MD  Multiple Vitamin (MULTIVITAMIN WITH MINERALS) TABS tablet Take 1 tablet by mouth daily. 05/27/22  Yes Setzer, Lynnell Jude, PA-C  nicotine (NICODERM CQ - DOSED IN MG/24 HOURS) 14 mg/24hr patch Place 1 patch (14 mg total) onto the skin daily. 05/28/22  Yes Setzer, Lynnell Jude, PA-C  pantoprazole (PROTONIX) 40 MG tablet Take 1 tablet (40 mg total) by mouth at bedtime. 06/20/22  Yes Fanny Dance, MD  saccharomyces boulardii (FLORASTOR) 250 MG capsule Take 1 capsule (250 mg total) by mouth 2 (two) times daily.  06/12/22 07/12/22 Yes Dahal, Melina Schools, MD  senna-docusate (SENOKOT-S) 8.6-50 MG tablet Take 1 tablet by mouth 2 (two) times daily. 05/27/22  Yes Setzer, Lynnell Jude, PA-C  thiamine (VITAMIN B-1) 100 MG tablet Take 1 tablet (100 mg total) by mouth daily. 06/19/22  Yes Hoy Register, MD  traMADol (ULTRAM) 50 MG tablet Take 1 tablet (50 mg total) by mouth every 6 (six) hours as needed. 05/27/22  Yes Setzer, Lynnell Jude, PA-C  amoxicillin-clavulanate (AUGMENTIN) 875-125 MG tablet Take 1 tablet by mouth 2 (two) times daily for 14 days. 06/23/22  07/07/22  Henrene Dodge, MD    Allergies: No Known Allergies  Review of Systems: Review of Systems  Constitutional:  Negative for chills and fever.  Respiratory:  Negative for shortness of breath.   Cardiovascular:  Negative for chest pain.  Gastrointestinal:  Negative for abdominal pain, nausea and vomiting.  Skin:  Negative for rash.       Drainage from left buttocks and perineal wounds.    Physical Exam BP 119/86   Pulse 76   Temp 98.7 F (37.1 C) (Oral)   Ht 6\' 2"  (1.88 m)   Wt 254 lb 3.2 oz (115.3 kg)   SpO2 98%   BMI 32.64 kg/m  CONSTITUTIONAL: No acute distress. HEENT:  Normocephalic, atraumatic, extraocular motion intact. RESPIRATORY:  Normal respiratory effort without pathologic use of accessory muscles. CARDIOVASCULAR: Regular rhythm and rate. SKIN:  Left buttocks has multiple areas of skin wounds consistent with hidradenitis.  There are three areas with some drainage, with much improved tenderness and less induration compared to his hospital admission.  In the perineum, the patient has one area with drainage that is also soft and less tender.  He also has a few wounds in the inner left thigh/groin area, but without any drainage and are soft and non-tender as well.  The buttocks area was dressed with gauze/ABD pad. NEUROLOGIC:  Motor and sensation is grossly normal.  Cranial nerves are grossly intact. PSYCH:  Alert and oriented to person, place and time. Affect is normal.   Assessment and Plan: This is a 56 y.o. male with hidradenitis of the left buttocks, perineum, and inner thigh/groin.  --Discussed with the patient that although there are still some areas of drainage, the induration and tenderness has significantly improved.  The antibiotic has been helping keep these wounds at bay and improving.  Will continue him on Augmentin for an additional 2 weeks to continue improving the wounds and hopefully continue to decrease the amount of drainage.  This would then help  57 avoid having to do any extensive I&D, which would require multiple areas of packing or dressing changes, which would be more difficult for the patient.  He's in agreement. --Follow up in 3 weeks to see how his wounds are doing.  I spent 25 minutes dedicated to the care of this patient on the date of this encounter to include pre-visit review of records, face-to-face time with the patient discussing diagnosis and management, and any post-visit coordination of care.   [place], MD [facility]

## 2022-06-23 NOTE — Patient Instructions (Signed)
Please pick up your prescription at your pharmacy.  If you have any concerns or questions, please feel free to call our office. See follow up appointment below.   Skin Abscess  A skin abscess is an infected area of your skin that contains pus and other material. An abscess can happen in any part of your body. Some abscesses break open (rupture) on their own. Most continue to get worse unless they are treated. The infection can spread deeper into the body and into your blood, which can make you feel sick. A skin abscess is caused by germs that enter the skin through a cut or scrape. It can also be caused by blocked oil and sweat glands or infected hair follicles. This condition is usually treated by: Draining the pus. Taking antibiotic medicines. Placing a warm, wet washcloth over the abscess. Follow these instructions at home: Medicines  Take over-the-counter and prescription medicines only as told by your doctor. If you were prescribed an antibiotic medicine, take it as told by your doctor. Do not stop taking the antibiotic even if you start to feel better. Abscess care  If you have an abscess that has not drained, place a warm, clean, wet washcloth over the abscess several times a day. Do this as told by your doctor. Follow instructions from your doctor about how to take care of your abscess. Make sure you: Cover the abscess with a bandage (dressing). Change your bandage or gauze as told by your doctor. Wash your hands with soap and water before you change the bandage or gauze. If you cannot use soap and water, use hand sanitizer. Check your abscess every day for signs that the infection is getting worse. Check for: More redness, swelling, or pain. More fluid or blood. Warmth. More pus or a bad smell. General instructions To avoid spreading the infection: Do not share personal care items, towels, or hot tubs with others. Avoid making skin-to-skin contact with other people. Keep all  follow-up visits as told by your doctor. This is important. Contact a doctor if: You have more redness, swelling, or pain around your abscess. You have more fluid or blood coming from your abscess. Your abscess feels warm when you touch it. You have more pus or a bad smell coming from your abscess. Your muscles ache. You feel sick. Get help right away if: You have very bad (severe) pain. You see red streaks on your skin spreading away from the abscess. You see redness that spreads quickly. You have a fever or chills. Summary A skin abscess is an infected area of your skin that contains pus and other material. The abscess is caused by germs that enter the skin through a cut or scrape. It can also be caused by blocked oil and sweat glands or infected hair follicles. Follow your doctor's instructions on caring for your abscess, taking medicines, preventing infections, and keeping follow-up visits. This information is not intended to replace advice given to you by your health care provider. Make sure you discuss any questions you have with your health care provider. Document Revised: 09/02/2021 Document Reviewed: 09/02/2021 Elsevier Patient Education  2023 [employer].

## 2022-06-24 ENCOUNTER — Other Ambulatory Visit: Payer: Self-pay

## 2022-06-24 ENCOUNTER — Ambulatory Visit: Payer: Self-pay | Admitting: Gerontology

## 2022-06-24 ENCOUNTER — Encounter: Payer: Self-pay | Admitting: Gerontology

## 2022-06-24 VITALS — BP 137/90 | HR 70 | Temp 97.9°F | Resp 16 | Ht 74.0 in | Wt 255.9 lb

## 2022-06-24 DIAGNOSIS — Z7689 Persons encountering health services in other specified circumstances: Secondary | ICD-10-CM

## 2022-06-24 DIAGNOSIS — J449 Chronic obstructive pulmonary disease, unspecified: Secondary | ICD-10-CM

## 2022-06-24 DIAGNOSIS — L732 Hidradenitis suppurativa: Secondary | ICD-10-CM

## 2022-06-24 MED ORDER — ALBUTEROL SULFATE HFA 108 (90 BASE) MCG/ACT IN AERS
1.0000 | INHALATION_SPRAY | Freq: Four times a day (QID) | RESPIRATORY_TRACT | 3 refills | Status: DC | PRN
  Filled 2022-06-24: qty 18, 25d supply, fill #0

## 2022-06-24 NOTE — Progress Notes (Signed)
New Patient Office Visit  Subjective    Patient ID: James Lambert, male    DOB: 11/20/1966  Age: 56 y.o. 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 He states that he's compliant with his medications, denies side effects and continues to make healthy lifestyle changes. He was discharged from [facility] on 06/12/22 and during hospital course was treated  with antibiotics for multiple draining wounds to his left buttocks secondary to hidradenitis. He ws seen by General Surgery on 06/23/22 by Dr Aleen Campi, and will continue on Augmentin for another 2 weeks and he will follow-up on 07/14/2022 and he will be picking up antibiotics today.  Overall, he states that he is doing well and offers no further complaint.    Outpatient Encounter Medications as of 06/24/2022  Medication Sig   acetaminophen (TYLENOL) 325 MG tablet Take 1-2 tablets (325-650 mg total) by mouth every 4 (four) hours as needed for mild pain.   albuterol (VENTOLIN HFA) 108 (90 Base) MCG/ACT inhaler Inhale 1-2 puffs into the lungs every 6 (six) hours as needed for wheezing or shortness of breath.   amLODipine (NORVASC) 10 MG tablet Take 1 tablet (10 mg total) by mouth once daily.   atorvastatin (LIPITOR) 80 MG tablet Take 1 tablet (80 mg total) by mouth once daily.   clindamycin (CLINDAGEL) 1 % gel Apply topically 2 (two) times daily.   clopidogrel (PLAVIX) 75 MG tablet Take 1 tablet (75 mg total) by mouth daily.   folic acid (FOLVITE) 1 MG tablet Take 1 tablet (1 mg total) by mouth  daily.   metoprolol succinate (TOPROL-XL) 50 MG 24 hr tablet Take 1 tablet (50 mg total) by mouth daily. Take with or immediately following a meal.   Multiple Vitamin (MULTIVITAMIN WITH MINERALS) TABS tablet Take 1 tablet by mouth daily.   pantoprazole (PROTONIX) 40 MG tablet Take 1 tablet (40 mg total) by mouth at bedtime.   thiamine (VITAMIN B-1) 100 MG tablet Take 1 tablet (100 mg total) by mouth daily.   amoxicillin-clavulanate (AUGMENTIN) 875-125 MG tablet Take 1 tablet by mouth 2 (two) times daily for 14 days. (Patient not taking: Reported on 06/24/2022)   saccharomyces boulardii (FLORASTOR) 250 MG capsule Take 1 capsule (250 mg total) by mouth 2 (two) times daily.   [DISCONTINUED] Baclofen 5 MG TABS Take 1 tablet (5 mg) by mouth three times daily.   [DISCONTINUED] nicotine (NICODERM CQ - DOSED IN MG/24 HOURS) 14 mg/24hr patch Place 1 patch (14 mg total) onto the skin daily. (Patient not taking: Reported on 06/24/2022)   [DISCONTINUED] senna-docusate (SENOKOT-S) 8.6-50 MG tablet Take 1 tablet by mouth 2 (two) times daily.   [DISCONTINUED] traMADol (ULTRAM) 50 MG tablet Take 1 tablet (50 mg total) by mouth every 6 (six) hours as needed.   No facility-administered encounter medications on file as of 06/24/2022.    Past Medical History:  Diagnosis Date   Asthma    COPD (chronic obstructive pulmonary disease) (HCC)    Hidradenitis suppurativa    diagnosed in  [facility] ED based on history and physical exam   Hypertension     Past Surgical History:  Procedure Laterality Date   IR CT HEAD LTD  05/10/2022   IR FLUORO GUIDED NEEDLE PLC ASPIRATION/INJECTION LOC  03/20/2022   IR PERCUTANEOUS ART THROMBECTOMY/INFUSION INTRACRANIAL INC DIAG ANGIO  05/10/2022   IR US GUIDE VASC ACCESS RIGHT  05/10/2022   RADIOLOGY WITH ANESTHESIA N/A 05/10/2022   Procedure: IR WITH ANESTHESIA;  Surgeon: Julieanne Cotton, MD;  Location: [facility] OR;  Service: Radiology;  Laterality: N/A;   TEE WITHOUT CARDIOVERSION N/A 03/11/2022    Procedure: TRANSESOPHAGEAL ECHOCARDIOGRAM (TEE);  Surgeon: Lamar Blinks, MD;  Location: [facility] ORS;  Service: Cardiovascular;  Laterality: N/A;    Family History  Problem Relation Age of Onset   Diabetes Mother    Alzheimer's disease Mother    Hypertension Father    Bone cancer Father    Diabetes Brother    Hypertension Brother    Other Maternal Grandmother        unknown medical history   Other Maternal Grandfather        unknown medical history   Other Paternal Grandmother        unknown medical history   Other Paternal Grandfather        unknown medical history    Social History   Socioeconomic History   Marital status: Single    Spouse name: Not on file   Number of children: Not on file   Years of education: Not on file   Highest education level: Not on file  Occupational History   Not on file  Tobacco Use   Smoking status: Former    Packs/day: 0.50    Types: Cigarettes    Quit date: 05/2022    Years since quitting: 0.1   Smokeless tobacco: Never  Vaping Use   Vaping Use: Never used  Substance and Sexual Activity   Alcohol use: Yes    Comment: last use ~06/07/22, previous 2-3 40oz beer per day   Drug use: Yes    Frequency: 14.0 times per week    Types: Marijuana    Comment: 1-2 times per day   Sexual activity: Not on file  Other Topics Concern   Not on file  Social History Narrative   Not on file   Social Determinants of Health   Financial Resource Strain: Not on file  Food Insecurity: Food Insecurity Present (06/24/2022)   Hunger Vital Sign    Worried About Running Out of Food in the Last Year: Often true    Ran Out of Food in the Last Year: Often true  Transportation Needs: Unmet Transportation Needs (06/24/2022)   PRAPARE - Administrator, Civil Service (Medical): Yes    Lack of Transportation (Non-Medical): Yes  Physical Activity: Not on file  Stress: Not on file  Social Connections: Not on file  Intimate Partner Violence: Not on  file    Review of Systems  Constitutional: Negative.   HENT: Negative.    Eyes: Negative.   Respiratory: Negative.    Cardiovascular: Negative.   Gastrointestinal: Negative.   Genitourinary: Negative.   Musculoskeletal: Negative.   Skin: Negative.        Multiple draining sites to left buttocks  Neurological: Negative.   Endo/Heme/Allergies: Negative.   Psychiatric/Behavioral: Negative.          Objective    BP 137/90 (BP Location: Right Arm, Patient Position: Sitting, Cuff Size: Large)   Pulse 70  Temp 97.9 F (36.6 C) (Oral)   Resp 16   Ht 6\' 2"  (1.88 m)   Wt 255 lb 14.4 oz (116.1 kg)   SpO2 95%   BMI 32.86 kg/m   Physical Exam HENT:     Head: Normocephalic and atraumatic.     Nose: Nose normal.     Mouth/Throat:     Mouth: Mucous membranes are moist.  Eyes:     Extraocular Movements: Extraocular movements intact.     Conjunctiva/sclera: Conjunctivae normal.     Pupils: Pupils are equal, round, and reactive to light.  Cardiovascular:     Rate and Rhythm: Normal rate and regular rhythm.     Pulses: Normal pulses.     Heart sounds: Normal heart sounds.  Pulmonary:     Effort: Pulmonary effort is normal.     Breath sounds: Normal breath sounds.  Abdominal:     General: Bowel sounds are normal.     Palpations: Abdomen is soft.  Genitourinary:    Comments: Deferred per patient Musculoskeletal:        General: Normal range of motion.     Cervical back: Normal range of motion.  Skin:    General: Skin is warm.     Comments: Draining sites ( serosanguinous exudate) to left buttocks, gauze and ABD pad intact.  Neurological:     General: No focal deficit present.     Mental Status: He is alert and oriented to person, place, and time. Mental status is at baseline.  Psychiatric:        Mood and Affect: Mood normal.        Behavior: Behavior normal.        Thought Content: Thought content normal.        Judgment: Judgment normal.         Assessment &  Plan:   1. Encounter to establish care -He will continue hospital discharge instructions.  2. Chronic obstructive pulmonary disease, unspecified COPD type (HCC) -He stopped smoking, breathing is stable he will continue to use albuterol as needed. - albuterol (VENTOLIN HFA) 108 (90 Base) MCG/ACT inhaler; Inhale 1-2 puffs into the lungs every 6 (six) hours as needed for wheezing or shortness of breath.  Dispense: 18 g; Refill: 3  3. Hydradenitis -He will continue on Augmentin, dressing changes and follow-up with Dr. on 07/14/2022.  He was advised to go to the emergency room for worsening symptoms. -He was encouraged to complete [employer] application for ambulatory referral to Dermatology   Return in about 23 days (around 07/17/2022), or if symptoms worsen or fail to improve.   Anthonee Gelin 09/16/2022, NP

## 2022-06-25 ENCOUNTER — Other Ambulatory Visit: Payer: Self-pay

## 2022-06-27 ENCOUNTER — Other Ambulatory Visit: Payer: Self-pay

## 2022-07-14 ENCOUNTER — Other Ambulatory Visit: Payer: Self-pay | Admitting: Family Medicine

## 2022-07-14 ENCOUNTER — Other Ambulatory Visit: Payer: Self-pay | Admitting: Physician Assistant

## 2022-07-14 ENCOUNTER — Other Ambulatory Visit: Payer: Self-pay

## 2022-07-14 ENCOUNTER — Encounter: Payer: Self-pay | Admitting: Surgery

## 2022-07-14 ENCOUNTER — Ambulatory Visit (INDEPENDENT_AMBULATORY_CARE_PROVIDER_SITE_OTHER): Payer: Medicaid Other | Admitting: Surgery

## 2022-07-14 VITALS — BP 152/88 | HR 81 | Temp 98.2°F | Ht 74.0 in | Wt 252.6 lb

## 2022-07-14 DIAGNOSIS — I69351 Hemiplegia and hemiparesis following cerebral infarction affecting right dominant side: Secondary | ICD-10-CM

## 2022-07-14 DIAGNOSIS — I10 Essential (primary) hypertension: Secondary | ICD-10-CM

## 2022-07-14 DIAGNOSIS — L732 Hidradenitis suppurativa: Secondary | ICD-10-CM

## 2022-07-14 MED ORDER — AMOXICILLIN-POT CLAVULANATE 875-125 MG PO TABS
1.0000 | ORAL_TABLET | Freq: Two times a day (BID) | ORAL | 0 refills | Status: AC
  Filled 2022-07-14: qty 28, 14d supply, fill #0

## 2022-07-14 NOTE — Progress Notes (Signed)
07/14/2022  History of Present Illness: James Lambert is a 56 y.o. male presenting for follow up of hidradenitis, with wounds in the left buttocks and left groin/perineum.  He's been on antibiotics and now presents for follow up.  He reports that the areas are getting better/smaller, but still has areas of drainage and some pain, about 6/10 at times.  Denies any fevers, chills, chest pain, shortness of breath.  Still recovering from his stroke about two months ago.  Past Medical History: Past Medical History:  Diagnosis Date   Asthma    COPD (chronic obstructive pulmonary disease) (HCC)    Hidradenitis suppurativa    diagnosed in [facility] ED based on history and physical exam   Hypertension      Past Surgical History: Past Surgical History:  Procedure Laterality Date   IR CT HEAD LTD  05/10/2022   IR FLUORO GUIDED NEEDLE PLC ASPIRATION/INJECTION LOC  03/20/2022   IR PERCUTANEOUS ART THROMBECTOMY/INFUSION INTRACRANIAL INC DIAG ANGIO  05/10/2022   IR US GUIDE VASC ACCESS RIGHT  05/10/2022   RADIOLOGY WITH ANESTHESIA N/A 05/10/2022   Procedure: IR WITH ANESTHESIA;  Surgeon: Julieanne Cotton, MD;  Location: [facility] OR;  Service: Radiology;  Laterality: N/A;   TEE WITHOUT CARDIOVERSION N/A 03/11/2022   Procedure: TRANSESOPHAGEAL ECHOCARDIOGRAM (TEE);  Surgeon: Lamar Blinks, MD;  Location: [facility] ORS;  Service: Cardiovascular;  Laterality: N/A;    Home Medications: Prior to Admission medications   Medication Sig Start Date End Date Taking? Authorizing Provider  acetaminophen (TYLENOL) 325 MG tablet Take 1-2 tablets (325-650 mg total) by mouth every 4 (four) hours as needed for mild pain. 05/27/22  Yes Setzer, Lynnell Jude, PA-C  albuterol (VENTOLIN HFA) 108 (90 Base) MCG/ACT inhaler Inhale 1-2 puffs into the lungs every 6 (six) hours as needed for wheezing or shortness of breath. 06/24/22  Yes Iloabachie, Chioma E, NP  amLODipine (NORVASC) 10 MG tablet Take 1 tablet (10 mg total) by mouth once daily. 06/19/22   Yes Hoy Register, MD  atorvastatin (LIPITOR) 80 MG tablet Take 1 tablet (80 mg total) by mouth once daily. 06/19/22  Yes Hoy Register, MD  clopidogrel (PLAVIX) 75 MG tablet Take 1 tablet (75 mg total) by mouth daily. 06/19/22  Yes Hoy Register, MD  folic acid (FOLVITE) 1 MG tablet Take 1 tablet (1 mg total) by mouth daily. 06/19/22  Yes Hoy Register, MD  metoprolol succinate (TOPROL-XL) 50 MG 24 hr tablet Take 1 tablet (50 mg total) by mouth daily. Take with or immediately following a meal. 06/19/22  Yes Newlin, Odette Horns, MD  Multiple Vitamin (MULTIVITAMIN WITH MINERALS) TABS tablet Take 1 tablet by mouth daily. 05/27/22  Yes Setzer, Lynnell Jude, PA-C  pantoprazole (PROTONIX) 40 MG tablet Take 1 tablet (40 mg total) by mouth at bedtime. 06/20/22  Yes Fanny Dance, MD  thiamine (VITAMIN B-1) 100 MG tablet Take 1 tablet (100 mg total) by mouth daily. 06/19/22  Yes Hoy Register, MD  amoxicillin-clavulanate (AUGMENTIN) 875-125 MG tablet Take 1 tablet by mouth every 12 (twelve) hours for 14 days. 07/14/22 07/28/22  Henrene Dodge, MD    Allergies: No Known Allergies  Review of Systems: Review of Systems  Constitutional:  Negative for chills and fever.  Respiratory:  Negative for shortness of breath.   Cardiovascular:  Negative for chest pain.  Gastrointestinal:  Negative for nausea and vomiting.  Skin:        Left buttocks and groin/perineum wounds with drainage.    Physical Exam BP (!) 152/88  Pulse 81   Temp 98.2 F (36.8 C) (Oral)   Ht 6\' 2"  (1.88 m)   Wt 252 lb 9.6 oz (114.6 kg)   SpO2 98%   BMI 32.43 kg/m  CONSTITUTIONAL: No acute distress, well nourished. HEENT:  Normocephalic, atraumatic, extraocular motion intact. RESPIRATORY:  Normal respiratory effort without pathologic use of accessory muscles. CARDIOVASCULAR: Regular rhythm and rate SKIN:  Left buttocks has three main areas, one of which has more fluctuance and has a punctate wound for drainage.  This was expressed  more today in office, with seropurulent fluid.  The other two areas have more induration rather than fluctuance, consistent with hidradenitis.  In the left inner groin/perineum, there is one additional area of drainage, also of seropurulent fluid.  All areas dressed with 4x4 gauze and tape. NEUROLOGIC:  Motor and sensation is grossly normal.  Cranial nerves are grossly intact. PSYCH:  Alert and oriented to person, place and time. Affect is normal.  Assessment and Plan: This is a 56 y.o. male with hidradenitis.  --Discussed with the patient again the rationale for not doing any surgery right now.  Given his recent stroke and Plavix use, there would be a much higher risk of his stroke getting worse and we would not be able to come off his Plavix for surgery increasing the risk of bleeding.  The reason for antibiotics to try to consolidate these areas of infection is much as possible so later on a simpler I&D can be done in the office.  Right now the patient is approaching this and the areas have been improving as far as the expansion and now there are 2 main areas that are draining. - We will do another 2-week course of Augmentin for the patient to continue working on these areas of infection and will bring the patient back in 2 weeks for procedure visit to do an I&D at bedside of the areas that are draining still.  This will be much more tolerable than doing a more formal I&D that we would have had to do in the operating room when he initially presented which would have had much more risks. - Patient understands this plan and he is in agreement.  All of his questions have been answered.  I spent 20 minutes dedicated to the care of this patient on the date of this encounter to include pre-visit review of records, face-to-face time with the patient discussing diagnosis and management, and any post-visit coordination of care.   58, MD [facility]

## 2022-07-14 NOTE — Patient Instructions (Addendum)
If you have any concerns or questions, please feel free to call our office.  See follow up appointment below.   Skin Abscess  A skin abscess is an infected area of your skin that contains pus and other material. An abscess can happen in any part of your body. Some abscesses break open (rupture) on their own. Most continue to get worse unless they are treated. The infection can spread deeper into the body and into your blood, which can make you feel sick. A skin abscess is caused by germs that enter the skin through a cut or scrape. It can also be caused by blocked oil and sweat glands or infected hair follicles. This condition is usually treated by: Draining the pus. Taking antibiotic medicines. Placing a warm, wet washcloth over the abscess. Follow these instructions at home: Medicines  Take over-the-counter and prescription medicines only as told by your doctor. If you were prescribed an antibiotic medicine, take it as told by your doctor. Do not stop taking the antibiotic even if you start to feel better. Abscess care  If you have an abscess that has not drained, place a warm, clean, wet washcloth over the abscess several times a day. Do this as told by your doctor. Follow instructions from your doctor about how to take care of your abscess. Make sure you: Cover the abscess with a bandage (dressing). Change your bandage or gauze as told by your doctor. Wash your hands with soap and water before you change the bandage or gauze. If you cannot use soap and water, use hand sanitizer. Check your abscess every day for signs that the infection is getting worse. Check for: More redness, swelling, or pain. More fluid or blood. Warmth. More pus or a bad smell. General instructions To avoid spreading the infection: Do not share personal care items, towels, or hot tubs with others. Avoid making skin-to-skin contact with other people. Keep all follow-up visits as told by your doctor. This is  important. Contact a doctor if: You have more redness, swelling, or pain around your abscess. You have more fluid or blood coming from your abscess. Your abscess feels warm when you touch it. You have more pus or a bad smell coming from your abscess. Your muscles ache. You feel sick. Get help right away if: You have very bad (severe) pain. You see red streaks on your skin spreading away from the abscess. You see redness that spreads quickly. You have a fever or chills. Summary A skin abscess is an infected area of your skin that contains pus and other material. The abscess is caused by germs that enter the skin through a cut or scrape. It can also be caused by blocked oil and sweat glands or infected hair follicles. Follow your doctor's instructions on caring for your abscess, taking medicines, preventing infections, and keeping follow-up visits. This information is not intended to replace advice given to you by your health care provider. Make sure you discuss any questions you have with your health care provider. Document Revised: 02/27/2022 Document Reviewed: 09/02/2021 Elsevier Patient Education  2023 Elsevier Inc.  

## 2022-07-15 ENCOUNTER — Other Ambulatory Visit: Payer: Self-pay

## 2022-07-15 NOTE — Telephone Encounter (Signed)
Requested medication (s) are due for refill today: Yes  Requested medication (s) are on the active medication list: Yes  Last refill:  06/19/22  Future visit scheduled: No  Notes to clinic:  Pt. Established with new provider.    Requested Prescriptions  Pending Prescriptions Disp Refills   amLODipine (NORVASC) 10 MG tablet 30 tablet 0    Sig: Take 1 tablet (10 mg total) by mouth once daily.     Cardiovascular: Calcium Channel Blockers 2 Failed - 07/14/2022  5:40 PM      Failed - Last BP in normal range    BP Readings from Last 1 Encounters:  07/14/22 (!) 152/88         Passed - Last Heart Rate in normal range    Pulse Readings from Last 1 Encounters:  07/14/22 81         Passed - Valid encounter within last 6 months    Recent Outpatient Visits           3 weeks ago Hypertension, unspecified type   [facility] Hoy Register, MD       Future Appointments             In 6 days Fanny Dance, MD [facility], CPR   In 1 month Micki Riley, MD [facility]   In 5 months Deirdre Evener, MD [facility]             atorvastatin (LIPITOR) 80 MG tablet 30 tablet 0    Sig: Take 1 tablet (80 mg total) by mouth once daily.     Cardiovascular:  Antilipid - Statins Failed - 07/14/2022  5:40 PM      Failed - Lipid Panel in normal range within the last 12 months    Cholesterol  Date Value Ref Range Status  05/11/2022 140 0 - 200 mg/dL Final   LDL Cholesterol  Date Value Ref Range Status  05/11/2022 91 0 - 99 mg/dL Final    Comment:           Total Cholesterol/HDL:CHD Risk Coronary Heart Disease Risk Table                     Men   Women  1/2 Average Risk   3.4   3.3  Average Risk       5.0   4.4  2 X Average Risk   9.6   7.1  3 X Average Risk  23.4   11.0        Use the calculated Patient Ratio above and the CHD Risk Table to determine the patient's CHD Risk.         ATP III CLASSIFICATION (LDL):  <100     mg/dL   Optimal  008-676  mg/dL   Near or Above                    Optimal  130-159  mg/dL   Borderline  195-093  mg/dL   High  >267     mg/dL   Very High Performed at [facility], [address], [place], [place] [address]    HDL  Date Value Ref Range Status  05/11/2022 32 (L) >40 mg/dL Final   Triglycerides  Date Value Ref Range Status  05/11/2022 87 <150 mg/dL Final         Passed - Patient is not pregnant  Passed - Valid encounter within last 12 months    Recent Outpatient Visits           3 weeks ago Hypertension, unspecified type   [facility] Hoy Register, MD       Future Appointments             In 6 days Fanny Dance, MD [facility] Physical Medicine and Rehabilitation, CPR   In 1 month Micki Riley, MD [facility]   In 5 months Deirdre Evener, MD [facility]             clopidogrel (PLAVIX) 75 MG tablet 30 tablet 0    Sig: Take 1 tablet (75 mg total) by mouth daily.     Hematology: Antiplatelets - clopidogrel Failed - 07/14/2022  5:40 PM      Failed - HCT in normal range and within 180 days    HCT  Date Value Ref Range Status  06/09/2022 32.7 (L) 39.0 - 52.0 % Final         Failed - HGB in normal range and within 180 days    Hemoglobin  Date Value Ref Range Status  06/09/2022 10.8 (L) 13.0 - 17.0 g/dL Final         Failed - PLT in normal range and within 180 days    Platelets  Date Value Ref Range Status  06/09/2022 419 (H) 150 - 400 K/uL Final         Passed - Cr in normal range and within 360 days    Creatinine, Ser  Date Value Ref Range Status  06/09/2022 1.13 0.61 - 1.24 mg/dL Final         Passed - Valid encounter within last 6 months    Recent Outpatient Visits           3 weeks ago Hypertension, unspecified type   [facility] Hoy Register, MD       Future  Appointments             In 6 days Fanny Dance, MD [facility] Medicine and Rehabilitation, CPR   In 1 month Micki Riley, MD [facility]   In 5 months Deirdre Evener, MD [facility]             folic acid (FOLVITE) 1 MG tablet 30 tablet 0    Sig: Take 1 tablet (1 mg total) by mouth daily.     Endocrinology:  Vitamins Passed - 07/14/2022  5:40 PM      Passed - Valid encounter within last 12 months    Recent Outpatient Visits           3 weeks ago Hypertension, unspecified type   [facility] Hoy Register, MD       Future Appointments             In 6 days Fanny Dance, MD [facility] Medicine [facility] Rehabilitation, CPR   In 1 month Micki Riley, MD [facility]   In 5 months Deirdre Evener, MD [facility]             metoprolol succinate (TOPROL-XL) 50 MG 24 hr tablet 30 tablet 0    Sig: Take 1 tablet (50 mg total) by mouth daily. Take with or immediately following a meal.     Cardiovascular:  Beta Blockers Failed -  07/14/2022  5:40 PM      Failed - Last BP in normal range    BP Readings from Last 1 Encounters:  07/14/22 (!) 152/88         Passed - Last Heart Rate in normal range    Pulse Readings from Last 1 Encounters:  07/14/22 81         Passed - Valid encounter within last 6 months    Recent Outpatient Visits           3 weeks ago Hypertension, unspecified type   Crocker, Enobong, MD       Future Appointments             In 6 days Jennye Boroughs, MD [facility] Physical Medicine and Rehabilitation, CPR   In 1 month Garvin Fila, MD [facility]   In 5 months Ralene Bathe, MD [place]

## 2022-07-16 ENCOUNTER — Other Ambulatory Visit: Payer: Self-pay | Admitting: Pharmacist

## 2022-07-16 ENCOUNTER — Other Ambulatory Visit: Payer: Self-pay

## 2022-07-16 NOTE — Chronic Care Management (AMB) (Signed)
Patient outreached by Jarome Matin, PharmD Candidate on 07/16/2022 to discuss hypertension.   Patient does not have an automated home blood pressure machine.   Medication review was performed. They are taking medications as prescribed.   The following barriers to adherence were noted:  - Denies concerns with medication access or understanding.   The following interventions were completed:  - Medications were reviewed  - Patient was educated on medications, including indication and administration  - Patient was educated on use of adherence strategies, like a pill box or alarms  - Patient was educated on how to access home blood pressure machine. Plans to discuss with PCP at follow-up visit.  - Patient was counseled on lifestyle modifications to improve blood pressure. [place].5 miles/day currently.   The patient has follow up scheduled:  PCP: 07/17/2022 at 11:00 AM    Catie TClearance Coots, PharmD, [facility]  [phone]

## 2022-07-17 ENCOUNTER — Ambulatory Visit: Payer: Medicaid Other | Admitting: Gerontology

## 2022-07-17 ENCOUNTER — Other Ambulatory Visit: Payer: Self-pay

## 2022-07-17 ENCOUNTER — Encounter: Payer: Self-pay | Admitting: Gerontology

## 2022-07-17 VITALS — BP 116/76 | HR 72 | Temp 97.5°F | Resp 16 | Ht 74.0 in | Wt 257.8 lb

## 2022-07-17 DIAGNOSIS — I10 Essential (primary) hypertension: Secondary | ICD-10-CM

## 2022-07-17 DIAGNOSIS — L732 Hidradenitis suppurativa: Secondary | ICD-10-CM

## 2022-07-17 DIAGNOSIS — Z8719 Personal history of other diseases of the digestive system: Secondary | ICD-10-CM

## 2022-07-17 DIAGNOSIS — I69351 Hemiplegia and hemiparesis following cerebral infarction affecting right dominant side: Secondary | ICD-10-CM

## 2022-07-17 MED ORDER — METOPROLOL SUCCINATE ER 50 MG PO TB24
50.0000 mg | ORAL_TABLET | Freq: Every day | ORAL | 1 refills | Status: DC
  Filled 2022-07-17: qty 90, 90d supply, fill #0

## 2022-07-17 MED ORDER — PANTOPRAZOLE SODIUM 40 MG PO TBEC
40.0000 mg | DELAYED_RELEASE_TABLET | Freq: Every day | ORAL | 1 refills | Status: DC
  Filled 2022-07-17: qty 90, 90d supply, fill #0

## 2022-07-17 MED ORDER — ATORVASTATIN CALCIUM 80 MG PO TABS
80.0000 mg | ORAL_TABLET | Freq: Every day | ORAL | 1 refills | Status: DC
  Filled 2022-07-17: qty 90, 90d supply, fill #0

## 2022-07-17 MED ORDER — CLOPIDOGREL BISULFATE 75 MG PO TABS
75.0000 mg | ORAL_TABLET | Freq: Every day | ORAL | 1 refills | Status: DC
  Filled 2022-07-17: qty 90, 90d supply, fill #0

## 2022-07-17 MED ORDER — AMLODIPINE BESYLATE 10 MG PO TABS
10.0000 mg | ORAL_TABLET | Freq: Every day | ORAL | 1 refills | Status: DC
  Filled 2022-07-17: qty 90, 90d supply, fill #0

## 2022-07-17 MED ORDER — FOLIC ACID 1 MG PO TABS
1.0000 mg | ORAL_TABLET | Freq: Every day | ORAL | 1 refills | Status: DC
  Filled 2022-07-17: qty 90, 90d supply, fill #0

## 2022-07-17 NOTE — Progress Notes (Signed)
Established Patient Office Visit  Subjective   Patient ID: James Lambert, male    DOB: [birth year]  Age: 56 y.o. 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 He states that he's compliant with his medications, denies side effects and continues to make healthy lifestyle changes. He was seen by Dr Aleen Campi on 07/14/22 for Hydradenitis, there are 2 main areas that are draining. - We will do another 2-week course of Augmentin for the patient to continue working on these areas of infection and will bring the patient back in 2 weeks for procedure visit to do an I&D at bedside of the areas that are draining still.  Currently, he denies fever, chills , hematuria, hematochezia, active bleeding, and pain.  Overall, he states that he's doing well and offers no further complaint.  Review of Systems  Constitutional: Negative.   Eyes: Negative.   Respiratory: Negative.    Cardiovascular: Negative.   Gastrointestinal:  Negative for blood in stool.  Genitourinary:  Negative for hematuria.  Skin:        2 draining sites  due to hydradenitis to left buttocks, dressing intact  Neurological: Negative.   Endo/Heme/Allergies: Negative.   Psychiatric/Behavioral: Negative.        Objective:     BP 116/76 (BP Location: Right Arm, Patient Position: Sitting, Cuff Size: Large)   Pulse 72   Temp (!) 97.5 F (36.4 C) (Oral)   Resp 16   Ht 6\' 2"  (1.88 m)   Wt 257 lb 12.8 oz (116.9 kg)   SpO2 97%   BMI 33.10 kg/m  BP Readings from Last 3 Encounters:   07/17/22 116/76  07/14/22 (!) 152/88  06/24/22 137/90   Wt Readings from Last 3 Encounters:  07/17/22 257 lb 12.8 oz (116.9 kg)  07/14/22 252 lb 9.6 oz (114.6 kg)  06/24/22 255 lb 14.4 oz (116.1 kg)      Physical Exam HENT:     Head: Normocephalic and atraumatic.     Mouth/Throat:     Mouth: Mucous membranes are moist.  Eyes:     Extraocular Movements: Extraocular movements intact.     Conjunctiva/sclera: Conjunctivae normal.     Pupils: Pupils are equal, round, and reactive to light.  Cardiovascular:     Rate and Rhythm: Normal rate and regular rhythm.     Pulses: Normal pulses.     Heart sounds: Normal heart sounds.  Pulmonary:     Effort: Pulmonary effort is normal.     Breath sounds: Normal breath sounds.  Skin:    General: Skin is warm.     Comments: 2 draining sites to left buttocks  Neurological:     General: No focal deficit present.     Mental Status: He is alert and oriented to person, place, and time. Mental status is at baseline.  Psychiatric:        Mood and Affect: Mood normal.        Behavior: Behavior normal.        Thought Content:  Thought content normal.        Judgment: Judgment normal.      No results found for any visits on 07/17/22.  Last CBC Lab Results  Component Value Date   WBC 7.3 06/09/2022   HGB 10.8 (L) 06/09/2022   HCT 32.7 (L) 06/09/2022   MCV 90.3 06/09/2022   MCH 29.8 06/09/2022   RDW 13.2 06/09/2022   PLT 419 (H) 06/09/2022   Last metabolic panel Lab Results  Component Value Date   GLUCOSE 117 (H) 06/09/2022   NA 138 06/09/2022   K 3.5 06/09/2022   CL 105 06/09/2022   CO2 24 06/09/2022   BUN 7 06/09/2022   CREATININE 1.13 06/09/2022   GFRNONAA >60 06/09/2022   CALCIUM 8.6 (L) 06/09/2022   PHOS 2.5 05/11/2022   PROT 7.4 06/09/2022   ALBUMIN 3.0 (L) 06/09/2022   BILITOT 0.4 06/09/2022   ALKPHOS 93 06/09/2022   AST 16 06/09/2022   ALT 12 06/09/2022   ANIONGAP 9 06/09/2022   Last lipids Lab Results   Component Value Date   CHOL 140 05/11/2022   HDL 32 (L) 05/11/2022   LDLCALC 91 05/11/2022   TRIG 87 05/11/2022   CHOLHDL 4.4 05/11/2022   Last hemoglobin A1c Lab Results  Component Value Date   HGBA1C 5.3 03/19/2022   Last thyroid functions No results found for: "TSH", "T3TOTAL", "T4TOTAL", "THYROIDAB" Last vitamin D No results found for: "25OHVITD2", "25OHVITD3", "VD25OH" Last vitamin B12 and Folate Lab Results  Component Value Date   VITAMINB12 365 06/10/2022   FOLATE 16.2 06/10/2022      The ASCVD Risk score (Arnett DK, et al., 2019) failed to calculate for the following reasons:   The patient has a prior MI or stroke diagnosis    Assessment & Plan:   1. Hypertension, unspecified type - His blood pressure is under control, he will continue current medication, DASH diet and exercise as tolerated. - amLODipine (NORVASC) 10 MG tablet; Take 1 tablet (10 mg total) by mouth once daily.  Dispense: 90 tablet; Refill: 1 - folic acid (FOLVITE) 1 MG tablet; Take 1 tablet (1 mg total) by mouth daily.  Dispense: 90 tablet; Refill: 1 - metoprolol succinate (TOPROL-XL) 50 MG 24 hr tablet; Take 1 tablet (50 mg total) by mouth daily. Take with or immediately following a meal.  Dispense: 90 tablet; Refill: 1  2. Hemiparesis affecting right side as late effect of stroke [facility]) - He will continue current medication, low fat/cholesterol diet and follow up with Neurology. - atorvastatin (LIPITOR) 80 MG tablet; Take 1 tablet (80 mg total) by mouth once daily.  Dispense: 90 tablet; Refill: 1 - clopidogrel (PLAVIX) 75 MG tablet; Take 1 tablet (75 mg total) by mouth daily.  Dispense: 90 tablet; Refill: 1  3. Hydradenitis - He will continue on Augmentin and follow up with Dr Aleen Campi. He was advised to go to the ED for worsening symptoms.  4. History of gastroesophageal reflux (GERD) - His acid reflux is under control, he will continue current medication -Avoid spicy, fatty and fried  food -Avoid sodas and sour juices -Avoid heavy meals -Avoid eating 4 hours before bedtime -Elevate head of bed at night - pantoprazole (PROTONIX) 40 MG tablet; Take 1 tablet (40 mg total) by mouth at bedtime.  Dispense: 90 tablet; Refill: 1   Return in about 6 weeks (around 08/28/2022), or if symptoms worsen or fail to improve.    Hanny Elsberry Trellis Paganini, NP

## 2022-07-17 NOTE — Patient Instructions (Signed)

## 2022-07-21 ENCOUNTER — Encounter: Payer: Self-pay | Attending: Physical Medicine & Rehabilitation | Admitting: Physical Medicine & Rehabilitation

## 2022-07-28 ENCOUNTER — Ambulatory Visit (INDEPENDENT_AMBULATORY_CARE_PROVIDER_SITE_OTHER): Payer: Medicaid Other | Admitting: Surgery

## 2022-07-28 ENCOUNTER — Other Ambulatory Visit: Payer: Self-pay

## 2022-07-28 ENCOUNTER — Encounter: Payer: Self-pay | Admitting: Surgery

## 2022-07-28 VITALS — BP 135/93 | HR 79 | Temp 98.7°F | Ht 74.0 in | Wt 250.0 lb

## 2022-07-28 DIAGNOSIS — L732 Hidradenitis suppurativa: Secondary | ICD-10-CM

## 2022-07-28 NOTE — Patient Instructions (Signed)
Please remove all dressings from the wounds. Shower as usual. Be sure to wash well with soap and water and rinse well.  Apply dressings to all the wounds and secure with tape. Do this daily.

## 2022-07-28 NOTE — Progress Notes (Signed)
  Procedure Date:  07/28/2022  Pre-operative Diagnosis:  Hidradenitis of left buttocks and left perineum/groin  Post-operative Diagnosis: Hidradenitis of left buttocks and left perineum/groin  Procedure:   Incision and drainage of left buttocks hidradenitis x 3 areas (3 incisions) Incision and drainage of left perineum/groin hidradenitis x 2 areas (2 incisions).  Surgeon:  Howie Ill, MD  Anesthesia:  15 ml 1% lidocaine with epi  Estimated Blood Loss:  10 ml  Specimens:  None  Complications:  None  Indications for Procedure:  This is a 56 y.o. male with diagnosis of hidradenitis of left buttocks and left perineum/groin, which have been treated with oral antibiotic course while trying to get them smaller for smaller I&D procedures, as he is on Plavix and cannot come off it due to stroke history. The risks of bleeding, abscess or infection, injury to surrounding structures, and need for further procedures were all discussed with the patient and was willing to proceed.  Description of Procedure: The patient was correctly identified at bedside.  Appropriate time-outs were performed prior to procedure.  The patient's left groin and perineum were prepped and draped in usual sterile fashion.  Two areas of drainage were identified.  Local anesthetic was infused intradermally.  A cruciate 1 cm incision was made over each of the two areas, revealing mild volume of purulent fluid.  Small Kelly forceps were used to dissect around the abscess tissue to open any remaining pockets of purulent fluid.  These two areas did not communicate with each other.  After drainage was completed, the cavities were irrigated and cleaned.  The wounds were dressed with 4x4 gauze and secured with tape.  We then proceed to the buttocks.  The left buttocks was prepped and draped in usual sterile fashion.  Three areas of drainage were identified.  Local anesthetic was infused intradermally.  A cruciate 1 cm incision  was made over each of the three areas, revealing mild to moderate volume of purulent fluid.  These three areas did not communicate with each other.  Small Kelly forceps were used to dissect around the abscess tissue to open any remaining pockets of purulent fluid.  After drainage was completed, the cavities were irrigated and cleaned.  The wounds were dressed with 4x4 gauze and covered with ABD pad and secured with tape.  The patient tolerated the procedure well and all sharps were appropriately disposed of at the end of the case.  --Patient may shower tomorrow.  Instructed on gauze dressing changes. --May take Tylenol or Ibuprofen for pain control --Follow up next week for wound check.   Howie Ill, MD

## 2022-08-06 ENCOUNTER — Encounter: Payer: MEDICAID | Admitting: Surgery

## 2022-08-07 ENCOUNTER — Encounter: Payer: Self-pay | Admitting: Physician Assistant

## 2022-08-07 ENCOUNTER — Ambulatory Visit (INDEPENDENT_AMBULATORY_CARE_PROVIDER_SITE_OTHER): Payer: Medicaid Other | Admitting: Physician Assistant

## 2022-08-07 VITALS — BP 156/91 | HR 72 | Temp 98.0°F | Ht 74.0 in | Wt 255.0 lb

## 2022-08-07 DIAGNOSIS — Z09 Encounter for follow-up examination after completed treatment for conditions other than malignant neoplasm: Secondary | ICD-10-CM

## 2022-08-07 DIAGNOSIS — L732 Hidradenitis suppurativa: Secondary | ICD-10-CM

## 2022-08-07 NOTE — Patient Instructions (Signed)
Continue to change your dressing every day.  Follow up here in 2 weeks.

## 2022-08-07 NOTE — Progress Notes (Signed)
[facility] POST-OP OFFICE VISIT  08/07/2022  HPI: James Lambert is a 56 y.o. male 10 days s/p incision and drain of left buttock (x3) and left perineum/groin (x2) hidradenitis with Dr Leland Johns  He seems to be doing better overall Still with soreness in the ara but this is improved Left buttock wounds with "clear" drainage, nothing reported from perineal/groin wounds No fever, chills No other new complaints.   Vital signs: BP (!) 156/91   Pulse 72   Temp 98 F (36.7 C)   Ht 6\' 2"  (1.88 m)   Wt 255 lb (115.7 kg)   SpO2 97%   BMI 32.74 kg/m    Physical Exam: Constitutional: Well appearing male, NAD Skin: RN present as chaperone, left perineal/groin incisions are healed, no drainage, mild induration. 3 incisions to the left buttock are healing well, a scant amount of clear serous fluid draining from most superior wound, no drainage from other two, surrounding tissue is indurated expectedly, no evidence of undrained collections  Assessment/Plan: This is a 56 y.o. male 10 days s/p incision and drain of left buttock (x3) and left perineum/groin (x2) hidradenitis with Dr 53   - Pain control prn  - Reviewed wound care recommendation; superficial guaze as needed  - I will see him again in 2 weeks for wound check; He understands to call with questions/concerns in the interim   -- Leland Johns, PA-C [facility] 08/07/2022, 1:37 PM M-F: 7am - 4pm

## 2022-08-14 ENCOUNTER — Inpatient Hospital Stay: Payer: Medicaid Other | Admitting: Neurology

## 2022-08-14 ENCOUNTER — Encounter: Payer: Self-pay | Admitting: Neurology

## 2022-08-21 ENCOUNTER — Encounter: Payer: Self-pay | Admitting: Physician Assistant

## 2022-08-21 ENCOUNTER — Other Ambulatory Visit: Payer: Self-pay

## 2022-08-21 ENCOUNTER — Ambulatory Visit (INDEPENDENT_AMBULATORY_CARE_PROVIDER_SITE_OTHER): Payer: Medicaid Other | Admitting: Physician Assistant

## 2022-08-21 VITALS — BP 151/90 | HR 74 | Temp 98.7°F | Ht 74.0 in | Wt 259.4 lb

## 2022-08-21 DIAGNOSIS — Z09 Encounter for follow-up examination after completed treatment for conditions other than malignant neoplasm: Secondary | ICD-10-CM

## 2022-08-21 DIAGNOSIS — L732 Hidradenitis suppurativa: Secondary | ICD-10-CM

## 2022-08-21 NOTE — Patient Instructions (Signed)
Continue with keep the area clean. Keep a dressing over the area and secure with tape.

## 2022-08-21 NOTE — Progress Notes (Signed)
[facility] POST-OP OFFICE VISIT  08/21/2022  HPI: James Lambert is a 56 y.o. male 24 days s/p incision and drain of left buttock (x3) and left perineum/groin (x2) hidradenitis with Dr Leland Johns  Overall doing well Still having scant amount of drainage from left buttock wounds No pain No fever, chills Still doing superficial guaze  Vital signs: BP (!) 151/90   Pulse 74   Temp 98.7 F (37.1 C) (Oral)   Ht 6\' 2"  (1.88 m)   Wt 259 lb 6.4 oz (117.7 kg)   SpO2 99%   BMI 33.30 kg/m    Physical Exam: Constitutional: Well appearing male, NAD Skin: present as chaperone; 3 incisions to the left buttock are healing well, a scant amount of seropurulent fluid draining from most inferior wound, no drainage from other two. With deep palpation I am not able to drain any significant amount of fluid. There does not appear to be any undrained abscess. Surrounding tissue is indurated expectedly, no evidence of undrained collections  Assessment/Plan: This is a 56 y.o. male 24 days s/p incision and drain of left buttock (x3) and left perineum/groin (x2) hidradenitis   - Pain control prn  - Reviewed wound care recommendation; superficial dressings  - I will see him in ~2 weeks for wound check; He understands to call with questions/concerns  -- 53, PA-C [facility] 08/21/2022, 2:00 PM M-F: 7am - 4pm

## 2022-08-21 NOTE — Patient Outreach (Signed)
  Care Coordination   08/21/2022 Name: ZIERE DOCKEN MRN: [id number] DOB: [birth year]   Telephone outreach to patient to obtain mRS was successfully completed. MRS= 3  Vanice Sarah [facility] Care Management Assistant [phone]

## 2022-08-28 ENCOUNTER — Ambulatory Visit: Payer: Medicaid Other | Admitting: Gerontology

## 2022-08-28 ENCOUNTER — Other Ambulatory Visit: Payer: Self-pay

## 2022-08-28 ENCOUNTER — Encounter: Payer: Self-pay | Admitting: Gerontology

## 2022-08-28 VITALS — BP 147/95 | HR 73 | Temp 97.7°F | Resp 16 | Ht 74.0 in | Wt 261.5 lb

## 2022-08-28 DIAGNOSIS — I10 Essential (primary) hypertension: Secondary | ICD-10-CM

## 2022-08-28 DIAGNOSIS — M25462 Effusion, left knee: Secondary | ICD-10-CM

## 2022-08-28 DIAGNOSIS — L732 Hidradenitis suppurativa: Secondary | ICD-10-CM

## 2022-08-28 NOTE — Patient Instructions (Signed)
Chronic Knee Pain, Adult Chronic knee pain is pain in one or both knees that lasts longer than 3 months. Symptoms of chronic knee pain may include swelling, stiffness, and discomfort. Age-related wear and tear (osteoarthritis) of the knee joint is the most common cause of chronic knee pain. Other possible causes include: A long-term immune-related disease that causes inflammation of the knee (rheumatoid arthritis). This usually affects both knees. Inflammatory arthritis, such as gout or pseudogout. An injury to the knee that causes arthritis. An injury to the knee that damages the ligaments. Ligaments are strong tissues that connect bones to each other. Runner's knee or pain behind the kneecap. Treatment for chronic knee pain depends on the cause. The main treatments for chronic knee pain are physical therapy and weight loss. This condition may also be treated with medicines, injections, a knee sleeve or brace, and by using crutches. Rest, ice, pressure (compression), and elevation, also known as RICE therapy, may also be recommended. Follow these instructions at home: If you have a knee sleeve or brace:  Wear the knee sleeve or brace as told by your health care provider. Remove it only as told by your health care provider. Loosen it if your toes tingle, become numb, or turn cold and blue. Keep it clean. If the sleeve or brace is not waterproof: Do not let it get wet. Remove it if allowed by your health care provider, or cover it with a watertight covering when you take a bath or a shower. Managing pain, stiffness, and swelling     If directed, apply heat to the affected area as often as told by your health care provider. Use the heat source that your health care provider recommends, such as a moist heat pack or a heating pad. If you have a removable knee sleeve or brace, remove it as told by your health care provider. Place a towel between your skin and the heat source. Leave the heat on for  20-30 minutes. Remove the heat if your skin turns bright red. This is especially important if you are unable to feel pain, heat, or cold. You may have a greater risk of getting burned. If directed, put ice on the affected area. To do this: If you have a removable knee sleeve or brace, remove it as told by your health care provider. Put ice in a plastic bag. Place a towel between your skin and the bag. Leave the ice on for 20 minutes, 2-3 times a day. Remove the ice if your skin turns bright red. This is very important. If you cannot feel pain, heat, or cold, you have a greater risk of damage to the area. Move your toes often to reduce stiffness and swelling. Raise (elevate) the injured area above the level of your heart while you are sitting or lying down. Activity Avoid high-impact activities or exercises, such as running, jumping rope, or doing jumping jacks. Follow the exercise plan that your health care provider designed for you. Your health care provider may suggest that you: Avoid activities that make knee pain worse. This may require you to change your exercise routines, sport participation, or job duties. Wear shoes with cushioned soles. Avoid sports that require running and sudden changes in direction. Do physical therapy. Physical therapy is planned to match your needs and abilities. It may include exercises for strength, flexibility, stability, and endurance. Do exercises that increase balance and strength, such as tai chi and yoga. Do not use the injured limb to support your   body weight until your health care provider says that you can. Use crutches as told by your health care provider. Return to your normal activities as told by your health care provider. Ask your health care provider what activities are safe for you. General instructions Take over-the-counter and prescription medicines only as told by your health care provider. Lose weight if you are overweight. Losing even a  little weight can reduce knee pain. Ask your health care provider what your ideal weight is, and how to safely lose extra weight. A dietitian may be able to help you plan your meals. Do not use any products that contain nicotine or tobacco, such as cigarettes, e-cigarettes, and chewing tobacco. These can delay healing. If you need help quitting, ask your health care provider. Keep all follow-up visits. This is important. Contact a health care provider if: You have knee pain that is not getting better or gets worse. You are unable to do your physical therapy exercises due to knee pain. Get help right away if: Your knee swells and the swelling becomes worse. You cannot move your knee. You have severe knee pain. Summary Knee pain that lasts more than 3 months is considered chronic knee pain. The main treatments for chronic knee pain are physical therapy and weight loss. You may also need to take medicines, wear a knee sleeve or brace, use crutches, and apply ice or heat. Losing even a little weight can reduce knee pain. Ask your health care provider what your ideal weight is, and how to safely lose extra weight. A dietitian may be able to help you plan your meals. Follow the exercise plan that your health care provider designed for you. This information is not intended to replace advice given to you by your health care provider. Make sure you discuss any questions you have with your health care provider. Document Revised: 05/09/2020 Document Reviewed: 05/09/2020 Elsevier Patient Education  [place]. Cholesterol Content in Foods Cholesterol is a waxy, fat-like substance that helps to carry fat in the blood. The body needs cholesterol in small amounts, but too much cholesterol can cause damage to the arteries and heart. What foods have cholesterol?  Cholesterol is found in animal-based foods, such as meat, seafood, and dairy. Generally, low-fat dairy and lean meats have less cholesterol  than full-fat dairy and fatty meats. The milligrams of cholesterol per serving (mg per serving) of common cholesterol-containing foods are listed below. Meats and other proteins Egg -- one large whole egg has 186 mg. Veal shank -- 4 oz (113 g) has 141 mg. Lean ground [place] (93% lean) -- 4 oz (113 g) has 118 mg. Fat-trimmed lamb loin -- 4 oz (113 g) has 106 mg. Lean ground beef (90% lean) -- 4 oz (113 g) has 100 mg. Lobster -- 3.5 oz (99 g) has 90 mg. Pork loin chops -- 4 oz (113 g) has 86 mg. Canned salmon -- 3.5 oz (99 g) has 83 mg. Fat-trimmed beef top loin -- 4 oz (113 g) has 78 mg. Frankfurter -- 1 frank (3.5 oz or 99 g) has 77 mg. Crab -- 3.5 oz (99 g) has 71 mg. Roasted chicken without skin, white meat -- 4 oz (113 g) has 66 mg. Light bologna -- 2 oz (57 g) has 45 mg. Deli-cut [place] -- 2 oz (57 g) has 31 mg. Canned tuna -- 3.5 oz (99 g) has 31 mg. Berniece Salines -- 1 oz (28 g) has 29 mg. Oysters and mussels (raw) -- 3.5  oz (99 g) has 25 mg. Mackerel -- 1 oz (28 g) has 22 mg. Trout -- 1 oz (28 g) has 20 mg. Pork sausage -- 1 link (1 oz or 28 g) has 17 mg. Salmon -- 1 oz (28 g) has 16 mg. Tilapia -- 1 oz (28 g) has 14 mg. Dairy Soft-serve ice cream --  cup (4 oz or 86 g) has 103 mg. Whole-milk yogurt -- 1 cup (8 oz or 245 g) has 29 mg. Cheddar cheese -- 1 oz (28 g) has 28 mg. American cheese -- 1 oz (28 g) has 28 mg. Whole milk -- 1 cup (8 oz or 250 mL) has 23 mg. 2% milk -- 1 cup (8 oz or 250 mL) has 18 mg. Cream cheese -- 1 tablespoon (Tbsp) (14.5 g) has 15 mg. Cottage cheese --  cup (4 oz or 113 g) has 14 mg. Low-fat (1%) milk -- 1 cup (8 oz or 250 mL) has 10 mg. Sour cream -- 1 Tbsp (12 g) has 8.5 mg. Low-fat yogurt -- 1 cup (8 oz or 245 g) has 8 mg. Nonfat Greek yogurt -- 1 cup (8 oz or 228 g) has 7 mg. Half-and-half cream -- 1 Tbsp (15 mL) has 5 mg. Fats and oils Cod liver oil -- 1 tablespoon (Tbsp) (13.6 g) has 82 mg. Butter -- 1 Tbsp (14 g) has 15 mg. Lard -- 1 Tbsp  (12.8 g) has 14 mg. Bacon grease -- 1 Tbsp (12.9 g) has 14 mg. Mayonnaise -- 1 Tbsp (13.8 g) has 5-10 mg. Margarine -- 1 Tbsp (14 g) has 3-10 mg. The items listed above may not be a complete list of foods with cholesterol. Exact amounts of cholesterol in these foods may vary depending on specific ingredients and brands. Contact a dietitian for more information. What foods do not have cholesterol? Most plant-based foods do not have cholesterol unless you combine them with a food that has cholesterol. Foods without cholesterol include: Grains and cereals. Vegetables. Fruits. Vegetable oils, such as olive, canola, and sunflower oil. Legumes, such as peas, beans, and lentils. Nuts and seeds. Egg whites. The items listed above may not be a complete list of foods that do not have cholesterol. Contact a dietitian for more information. Summary The body needs cholesterol in small amounts, but too much cholesterol can cause damage to the arteries and heart. Cholesterol is found in animal-based foods, such as meat, seafood, and dairy. Generally, low-fat dairy and lean meats have less cholesterol than full-fat dairy and fatty meats. This information is not intended to replace advice given to you by your health care provider. Make sure you discuss any questions you have with your health care provider. Document Revised: 04/05/2021 Document Reviewed: 04/05/2021 Elsevier Patient Education  [place] Eating Plan DASH stands for Dietary Approaches to Stop Hypertension. The DASH eating plan is a healthy eating plan that has been shown to: Reduce high blood pressure (hypertension). Reduce your risk for type 2 diabetes, heart disease, and stroke. Help with weight loss. What are tips for following this plan? Reading food labels Check food labels for the amount of salt (sodium) per serving. Choose foods with less than 5 percent of the Daily Value of sodium. Generally, foods with less than 300  milligrams (mg) of sodium per serving fit into this eating plan. To find whole grains, look for the word "whole" as the first word in the ingredient list. Shopping Buy products labeled as "low-sodium" or "no salt added."  Buy fresh foods. Avoid canned foods and pre-made or frozen meals. Cooking Avoid adding salt when cooking. Use salt-free seasonings or herbs instead of table salt or sea salt. Check with your health care provider or pharmacist before using salt substitutes. Do not fry foods. Cook foods using healthy methods such as baking, boiling, grilling, roasting, and broiling instead. Cook with heart-healthy oils, such as olive, canola, avocado, soybean, or sunflower oil. Meal planning  Eat a balanced diet that includes: 4 or more servings of fruits and 4 or more servings of vegetables each day. Try to fill one-half of your plate with fruits and vegetables. 6-8 servings of whole grains each day. Less than 6 oz (170 g) of lean meat, poultry, or fish each day. A 3-oz (85-g) serving of meat is about the same size as a deck of cards. One egg equals 1 oz (28 g). 2-3 servings of low-fat dairy each day. One serving is 1 cup (237 mL). 1 serving of nuts, seeds, or beans 5 times each week. 2-3 servings of heart-healthy fats. Healthy fats called omega-3 fatty acids are found in foods such as walnuts, flaxseeds, fortified milks, and eggs. These fats are also found in cold-water fish, such as sardines, salmon, and mackerel. Limit how much you eat of: Canned or prepackaged foods. Food that is high in trans fat, such as some fried foods. Food that is high in saturated fat, such as fatty meat. Desserts and other sweets, sugary drinks, and other foods with added sugar. Full-fat dairy products. Do not salt foods before eating. Do not eat more than 4 egg yolks a week. Try to eat at least 2 vegetarian meals a week. Eat more home-cooked food and less restaurant, buffet, and fast food. Lifestyle When  eating at a restaurant, ask that your food be prepared with less salt or no salt, if possible. If you drink alcohol: Limit how much you use to: 0-1 drink a day for women who are not pregnant. 0-2 drinks a day for men. Be aware of how much alcohol is in your drink. In the [place] one drink equals one 12 oz bottle of beer (355 mL), one 5 oz glass of wine (148 mL), or one 1 oz glass of hard liquor (44 mL). General information Avoid eating more than 2,300 mg of salt a day. If you have hypertension, you may need to reduce your sodium intake to 1,500 mg a day. Work with your health care provider to maintain a healthy body weight or to lose weight. Ask what an ideal weight is for you. Get at least 30 minutes of exercise that causes your heart to beat faster (aerobic exercise) most days of the week. Activities may include walking, swimming, or biking. Work with your health care provider or dietitian to adjust your eating plan to your individual calorie needs. What foods should I eat? Fruits All fresh, dried, or frozen fruit. Canned fruit in natural juice (without added sugar). Vegetables Fresh or frozen vegetables (raw, steamed, roasted, or grilled). Low-sodium or reduced-sodium tomato and vegetable juice. Low-sodium or reduced-sodium tomato sauce and tomato paste. Low-sodium or reduced-sodium canned vegetables. Grains Whole-grain or whole-wheat bread. Whole-grain or whole-wheat pasta. Brown rice. [place]. Bulgur. Whole-grain and low-sodium cereals. Pita bread. Low-fat, low-sodium crackers. Whole-wheat flour tortillas. Meats and other proteins Skinless chicken or [place]. Ground chicken or [place]. Pork with fat trimmed off. Fish and seafood. Egg whites. Dried beans, peas, or lentils. Unsalted nuts, nut butters, and seeds. Unsalted canned beans. Lean cuts  of beef with fat trimmed off. Low-sodium, lean precooked or cured meat, such as sausages or meat loaves. Dairy Low-fat (1%) or fat-free (skim)  milk. Reduced-fat, low-fat, or fat-free cheeses. Nonfat, low-sodium ricotta or cottage cheese. Low-fat or nonfat yogurt. Low-fat, low-sodium cheese. Fats and oils Soft margarine without trans fats. Vegetable oil. Reduced-fat, low-fat, or light mayonnaise and salad dressings (reduced-sodium). Canola, safflower, olive, avocado, soybean, and sunflower oils. Avocado. Seasonings and condiments Herbs. Spices. Seasoning mixes without salt. Other foods Unsalted popcorn and pretzels. Fat-free sweets. The items listed above may not be a complete list of foods and beverages you can eat. Contact a dietitian for more information. What foods should I avoid? Fruits Canned fruit in a light or heavy syrup. Fried fruit. Fruit in cream or butter sauce. Vegetables Creamed or fried vegetables. Vegetables in a cheese sauce. Regular canned vegetables (not low-sodium or reduced-sodium). Regular canned tomato sauce and paste (not low-sodium or reduced-sodium). Regular tomato and vegetable juice (not low-sodium or reduced-sodium). Angie Fava. Olives. Grains Baked goods made with fat, such as croissants, muffins, or some breads. Dry pasta or rice meal packs. Meats and other proteins Fatty cuts of meat. Ribs. Fried meat. Berniece Salines. Bologna, salami, and other precooked or cured meats, such as sausages or meat loaves. Fat from the back of a pig (fatback). Bratwurst. Salted nuts and seeds. Canned beans with added salt. Canned or smoked fish. Whole eggs or egg yolks. Chicken or [place] with skin. Dairy Whole or 2% milk, cream, and half-and-half. Whole or full-fat cream cheese. Whole-fat or sweetened yogurt. Full-fat cheese. Nondairy creamers. Whipped toppings. Processed cheese and cheese spreads. Fats and oils Butter. Stick margarine. Lard. Shortening. Ghee. Bacon fat. Tropical oils, such as coconut, palm kernel, or palm oil. Seasonings and condiments Onion salt, garlic salt, seasoned salt, table salt, and sea salt. Worcestershire  sauce. Tartar sauce. Barbecue sauce. Teriyaki sauce. Soy sauce, including reduced-sodium. Steak sauce. Canned and packaged gravies. Fish sauce. Oyster sauce. Cocktail sauce. Store-bought horseradish. Ketchup. Mustard. Meat flavorings and tenderizers. Bouillon cubes. Hot sauces. Pre-made or packaged marinades. Pre-made or packaged taco seasonings. Relishes. Regular salad dressings. Other foods Salted popcorn and pretzels. The items listed above may not be a complete list of foods and beverages you should avoid. Contact a dietitian for more information. Where to find more information National Heart, Lung, and Blood Institute: https://wilson-eaton.com/ American Heart Association: www.heart.org Academy of Nutrition and Dietetics: www.eatright.T[place]: www.kidney.org Summary The DASH eating plan is a healthy eating plan that has been shown to reduce high blood pressure (hypertension). It may also reduce your risk for type 2 diabetes, heart disease, and stroke. When on the DASH eating plan, aim to eat more fresh fruits and vegetables, whole grains, lean proteins, low-fat dairy, and heart-healthy fats. With the DASH eating plan, you should limit salt (sodium) intake to 2,300 mg a day. If you have hypertension, you may need to reduce your sodium intake to 1,500 mg a day. Work with your health care provider or dietitian to adjust your eating plan to your individual calorie needs. This information is not intended to replace advice given to you by your health care provider. Make sure you discuss any questions you have with your health care provider. Document Revised: 10/28/2019 Document Reviewed: 10/28/2019 Elsevier Patient Education  [place].

## 2022-08-28 NOTE — Progress Notes (Signed)
Established Patient Office Visit  Subjective   Patient ID: James Lambert, male    DOB: 01/25/1966  Age: 56 y.o. 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 He states he is compliant with his medications and reports no side effects. He denies hematuria, hematochezia, or active bleeding. He does not check his blood pressure at home. He endorses 10/10 knee pain that is constant. He describes it as "sore," "achey," and "sharp." The pain does not radiate. Tylenol provides some relief. The pain is worse when he is immobile and begins to move. He his often short of breath with strenuous activity. He was seen by [facility] on 08/21/22 regarding his hydradenitis. Inicision and drainage was performed to his left buttocks and left perineum/groin area, and he has a follow-up in 2 weeks.  He has been cleansing his hydradenitis wounds with soap and water and rinsing with hydrogen peroxide daily. He offers no other complaints and states he is doing well.                    Review of Systems  Constitutional:  Positive for malaise/fatigue.  HENT: Negative.    Eyes: Negative.   Respiratory:  Positive for shortness of breath (.with strenous activity, happens daily.).   Cardiovascular: Negative.   Gastrointestinal:  Positive for heartburn.  Genitourinary: Negative.   Musculoskeletal:  Positive for myalgias (.knee pain bilaterally).  Neurological:  Positive for tingling (.ocassionally in bilateral fingers and toes.).  Endo/Heme/Allergies: Negative.   Psychiatric/Behavioral: Negative.        Objective:     BP (!) 147/95 (BP Location: Right Arm, Patient Position:  Sitting, Cuff Size: Large)   Pulse 73   Temp 97.7 F (36.5 C) (Oral)   Resp 16   Ht 6\' 2"  (1.88 m)   Wt 261 lb 8 oz (118.6 kg)   SpO2 95%   BMI 33.57 kg/m  BP Readings from Last 3 Encounters:  08/28/22 (!) 147/95  08/21/22 (!) 151/90  08/07/22 (!) 156/91   Wt Readings from Last 3 Encounters:  08/28/22 261 lb 8 oz (118.6 kg)  08/21/22 259 lb 6.4 oz (117.7 kg)  08/07/22 255 lb (115.7 kg)      Physical Exam Constitutional:      Appearance: Normal appearance. He is normal weight.  HENT:     Head: Normocephalic.     Mouth/Throat:     Mouth: Mucous membranes are moist.  Cardiovascular:     Rate and Rhythm: Normal rate and regular rhythm.     Pulses: Normal pulses.     Heart sounds: Normal heart sounds.  Pulmonary:     Effort: Pulmonary effort is normal.     Breath sounds: Normal breath sounds.  Musculoskeletal:     Cervical back: Normal range of motion.  Skin:    General: Skin is warm and dry.     Findings: Abscess (.L upper buttock.) present.          Comments: Hydradenitis on Left buttocks. S/p I&D. No erythema, edema, or warmth. Minimal serosanguinous drainage. Covered with dry gauze and tape.   Neurological:     General:  No focal deficit present.     Mental Status: He is alert and oriented to person, place, and time. Mental status is at baseline.  Psychiatric:        Mood and Affect: Mood normal.        Behavior: Behavior normal.        Thought Content: Thought content normal.        Judgment: Judgment normal.      No results found for any visits on 08/28/22.  Last CBC Lab Results  Component Value Date   WBC 7.3 06/09/2022   HGB 10.8 (L) 06/09/2022   HCT 32.7 (L) 06/09/2022   MCV 90.3 06/09/2022   MCH 29.8 06/09/2022   RDW 13.2 06/09/2022   PLT 419 (H) 06/09/2022   Last metabolic panel Lab Results  Component Value Date   GLUCOSE 117 (H) 06/09/2022   NA 138 06/09/2022   K 3.5 06/09/2022   CL 105 06/09/2022   CO2 24 06/09/2022   BUN 7  06/09/2022   CREATININE 1.13 06/09/2022   GFRNONAA >60 06/09/2022   CALCIUM 8.6 (L) 06/09/2022   PHOS 2.5 05/11/2022   PROT 7.4 06/09/2022   ALBUMIN 3.0 (L) 06/09/2022   BILITOT 0.4 06/09/2022   ALKPHOS 93 06/09/2022   AST 16 06/09/2022   ALT 12 06/09/2022   ANIONGAP 9 06/09/2022   Last lipids Lab Results  Component Value Date   CHOL 140 05/11/2022   HDL 32 (L) 05/11/2022   LDLCALC 91 05/11/2022   TRIG 87 05/11/2022   CHOLHDL 4.4 05/11/2022   Last hemoglobin A1c Lab Results  Component Value Date   HGBA1C 5.3 03/19/2022      The ASCVD Risk score (Arnett DK, et al., 2019) failed to calculate for the following reasons:   The patient has a prior MI or stroke diagnosis    Assessment & Plan:   1. Hypertension, unspecified type - His blood pressure is not controlled. He will continue on current medications. Follow DASH diet and exercise as tolerated.   2. Effusion of left knee joint - He should continue to take Tylenol as needed and stay active and will follow up with Orthopedic with a new PCP. He was advised to go to the ED for worsening pain.  3. Hydradenitis - Seen by [facility] on 08/21/22, s/p I&D. Continue to cleanse with soap, water, and hydrogen peroxide. Cover with dry gauze and tape. Monitor for s/sx of infection and go to the ED.  No follow-up. Patient now has Medicaid and [facility] wishes him well with his care.  Shella Maxim

## 2022-08-30 DIAGNOSIS — F1010 Alcohol abuse, uncomplicated: Secondary | ICD-10-CM | POA: Insufficient documentation

## 2022-08-30 DIAGNOSIS — K219 Gastro-esophageal reflux disease without esophagitis: Secondary | ICD-10-CM | POA: Insufficient documentation

## 2022-08-30 DIAGNOSIS — E782 Mixed hyperlipidemia: Secondary | ICD-10-CM | POA: Insufficient documentation

## 2022-09-02 ENCOUNTER — Ambulatory Visit: Payer: Medicaid Other | Admitting: Nurse Practitioner

## 2022-09-04 ENCOUNTER — Ambulatory Visit (INDEPENDENT_AMBULATORY_CARE_PROVIDER_SITE_OTHER): Payer: Medicaid Other | Admitting: Physician Assistant

## 2022-09-04 ENCOUNTER — Encounter: Payer: Self-pay | Admitting: Physician Assistant

## 2022-09-04 VITALS — BP 138/98 | HR 85 | Temp 98.0°F | Ht 74.0 in | Wt 261.0 lb

## 2022-09-04 DIAGNOSIS — Z09 Encounter for follow-up examination after completed treatment for conditions other than malignant neoplasm: Secondary | ICD-10-CM

## 2022-09-04 DIAGNOSIS — L732 Hidradenitis suppurativa: Secondary | ICD-10-CM | POA: Diagnosis not present

## 2022-09-04 MED ORDER — AMOXICILLIN-POT CLAVULANATE 875-125 MG PO TABS: 1.0000 | ORAL_TABLET | Freq: Two times a day (BID) | ORAL | 0 refills | Status: DC

## 2022-09-04 NOTE — Patient Instructions (Signed)
Keep dressing over the areas that are draining. We will send you in another prescription for antibiotics.  Follow up with Dr Hampton Abbot in 2-3 weeks.

## 2022-09-04 NOTE — Progress Notes (Signed)
[facility] POST-OP OFFICE VISIT  09/04/2022  HPI: James Lambert is a 56 y.o. male 38 days s/p incision and drain of left buttock (x3) and left perineum/groin (x2) hidradenitis with Dr Kirke Corin  He is doing okay Continues to have drainage from the superior most incision on the right buttock; this does appear more serosanguinous Feels he has a new, small, spot on the left medial buttock No fever, chills  Vital signs: BP (!) 138/98   Pulse 85   Temp 98 F (36.7 C)   Ht 6\' 2"  (1.88 m)   Wt 261 lb (118.4 kg)   SpO2 97%   BMI 33.51 kg/m    Physical Exam: Constitutional: Well appearing male, NAD Skin: Caryl-lyn present as chaperone; the most superior of the gluteal incisions is still closing, there is what appears to be serosanguinous drainage on the gauze and from the wound itself. I am unable to express any additional drainage with palpation. The remaining right gluteal incisions are otherwise closed. The tissues are indurated expectedly. To the medial left gluteal crease there is a small pinpot area with a small amount of purulent drainage consistent with his history of hidradenitis. No massive amounts of expressible drainage, non-tender.   Assessment/Plan: This is a 56 y.o. male 38 days s/p incision and drain of left buttock (x3) and left perineum/groin (x2) hidradenitis with Dr Kirke Corin   - He seems a little frustrated with the continued drainage and now new area of purulence on the left. He is unable to see dermatology until January. He expresses that he has done well previously with Abx and as such, I will send him a two week course of Augmentin as he has previously had  - I do not feel he warrants any additional I&D at this time.   - Reviewed wound care recommendation; continue superficial dressing  - I will have him return to clinic in 3-4 weeks for follow up; He understands to call with questions/concerns in the interim   -- Edison Simon, PA-C [facility] 09/04/2022, 2:31 PM M-F: 7am - 4pm

## 2022-09-10 ENCOUNTER — Encounter: Payer: Self-pay | Admitting: Nurse Practitioner

## 2022-09-10 ENCOUNTER — Ambulatory Visit (INDEPENDENT_AMBULATORY_CARE_PROVIDER_SITE_OTHER): Payer: Medicaid Other | Admitting: Nurse Practitioner

## 2022-09-10 VITALS — BP 115/75 | HR 69 | Temp 98.1°F | Ht 74.0 in | Wt 259.1 lb

## 2022-09-10 DIAGNOSIS — Z7689 Persons encountering health services in other specified circumstances: Secondary | ICD-10-CM

## 2022-09-10 DIAGNOSIS — E782 Mixed hyperlipidemia: Secondary | ICD-10-CM

## 2022-09-10 DIAGNOSIS — I10 Essential (primary) hypertension: Secondary | ICD-10-CM | POA: Diagnosis not present

## 2022-09-10 DIAGNOSIS — M25562 Pain in left knee: Secondary | ICD-10-CM

## 2022-09-10 DIAGNOSIS — Z8673 Personal history of transient ischemic attack (TIA), and cerebral infarction without residual deficits: Secondary | ICD-10-CM | POA: Diagnosis not present

## 2022-09-10 DIAGNOSIS — F1010 Alcohol abuse, uncomplicated: Secondary | ICD-10-CM

## 2022-09-10 DIAGNOSIS — I69351 Hemiplegia and hemiparesis following cerebral infarction affecting right dominant side: Secondary | ICD-10-CM | POA: Diagnosis not present

## 2022-09-10 DIAGNOSIS — G40909 Epilepsy, unspecified, not intractable, without status epilepticus: Secondary | ICD-10-CM

## 2022-09-10 DIAGNOSIS — L732 Hidradenitis suppurativa: Secondary | ICD-10-CM

## 2022-09-10 DIAGNOSIS — Z23 Encounter for immunization: Secondary | ICD-10-CM

## 2022-09-10 DIAGNOSIS — G8929 Other chronic pain: Secondary | ICD-10-CM

## 2022-09-10 DIAGNOSIS — N400 Enlarged prostate without lower urinary tract symptoms: Secondary | ICD-10-CM

## 2022-09-10 DIAGNOSIS — K219 Gastro-esophageal reflux disease without esophagitis: Secondary | ICD-10-CM

## 2022-09-10 DIAGNOSIS — I700 Atherosclerosis of aorta: Secondary | ICD-10-CM

## 2022-09-10 DIAGNOSIS — F17210 Nicotine dependence, cigarettes, uncomplicated: Secondary | ICD-10-CM

## 2022-09-10 DIAGNOSIS — M25561 Pain in right knee: Secondary | ICD-10-CM

## 2022-09-10 DIAGNOSIS — R7301 Impaired fasting glucose: Secondary | ICD-10-CM

## 2022-09-10 MED ORDER — ACETAMINOPHEN 325 MG PO TABS: 325.0000 mg | ORAL_TABLET | ORAL | Status: DC | PRN

## 2022-09-10 MED ORDER — PANTOPRAZOLE SODIUM 40 MG PO TBEC: 40.0000 mg | DELAYED_RELEASE_TABLET | Freq: Every day | ORAL | 4 refills | Status: DC

## 2022-09-10 MED ORDER — VITAMIN B-1 100 MG PO TABS
100.0000 mg | ORAL_TABLET | Freq: Every day | ORAL | 4 refills | Status: DC
Start: 2022-09-10 — End: 2023-03-27

## 2022-09-10 MED ORDER — METOPROLOL SUCCINATE ER 50 MG PO TB24: 50.0000 mg | ORAL_TABLET | Freq: Every day | ORAL | 4 refills | Status: DC

## 2022-09-10 MED ORDER — ATORVASTATIN CALCIUM 80 MG PO TABS: 80.0000 mg | ORAL_TABLET | Freq: Every day | ORAL | 4 refills | Status: DC

## 2022-09-10 MED ORDER — CLOPIDOGREL BISULFATE 75 MG PO TABS: 75.0000 mg | ORAL_TABLET | Freq: Every day | ORAL | 4 refills | Status: DC

## 2022-09-10 MED ORDER — ACETAMINOPHEN 325 MG PO TABS: 650.0000 mg | ORAL_TABLET | Freq: Four times a day (QID) | ORAL | 12 refills | Status: DC | PRN

## 2022-09-10 MED ORDER — AMLODIPINE BESYLATE 10 MG PO TABS: 10.0000 mg | ORAL_TABLET | Freq: Every day | ORAL | 4 refills | Status: DC

## 2022-09-10 NOTE — Assessment & Plan Note (Signed)
Chronic, stable at this time with BP below stroke prevention goal - <130/80.  Recommend she monitor BP at least a few mornings a week at home and document.  DASH diet at home.  Continue current medication regimen and adjust as needed, refills sent in.  Labs today: CBC, CMP, TSH.  Return in December for follow-up.

## 2022-09-10 NOTE — Assessment & Plan Note (Signed)
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  Did attend PT for this. May need to return in future if any changes present.  At this time continue current stroke prevention medication regimen and collaboration with neurology, scheduled to see them upcoming.

## 2022-09-10 NOTE — Assessment & Plan Note (Signed)
Chronic, ongoing issue with MRI left knee in hospital noting diffuse degeneration and radial tearing of the lateral meniscus + severe OA & previous right knee imaging noting severe OA.  Would benefit ortho referral, placed today and discussed with patient.  Recommend he start taking Tylenol at home, recent LFT stable, may take 1000 MG TID PRN.  Voltaren gel OTC as needed + alternate heat and ice.

## 2022-09-10 NOTE — Assessment & Plan Note (Signed)
Stable at this time, presented during CVA -- at this time no seizure medications on board.  Is scheduled to see neurology 10/20/22, will continue collaboration with them and review notes when available.

## 2022-09-10 NOTE — Assessment & Plan Note (Addendum)
Chronic, ongoing issue.  He has cut back, however recommend complete cessation of alcohol use to help prevent elevation in BP and stroke.  Check CMP, B12, and B1 today.

## 2022-09-10 NOTE — Assessment & Plan Note (Signed)
Chronic, ongoing.  Continue current medication regimen and adjust as needed. Lipid panel today. 

## 2022-09-10 NOTE — Assessment & Plan Note (Signed)
I have recommended complete cessation of tobacco use. I have discussed various options available for assistance with tobacco cessation including over the counter methods (Nicotine gum, patch and lozenges). We also discussed prescription options (Chantix, Nicotine Inhaler / Nasal Spray). The patient is not interested in pursuing any prescription tobacco cessation options at this time.  

## 2022-09-10 NOTE — Assessment & Plan Note (Signed)
Three CVA this past year -- April and June, most recent 05/10/22.  Is scheduled to see neurology on 10/20/22, appreciate their input.  Has family history of CVA, brother.  Recommend continue all current medications and discussed neuro goals: A1c <6.5%, LDL <70, and BP <130/80.  High risk for recurrent CVA, monitor closely and recommend complete cessation of smoking and alcohol use.

## 2022-09-10 NOTE — Patient Instructions (Signed)
Stroke Prevention Some medical conditions and behaviors can lead to a higher chance of having a stroke. You can help prevent a stroke by eating healthy, exercising, not smoking, and managing any medical conditions you have. Stroke is a leading cause of functional impairment. Primary prevention is particularly important because a majority of strokes are first-time events. Stroke changes the lives of not only those who experience a stroke but also their family and other caregivers. How can this condition affect me? A stroke is a medical emergency and should be treated right away. A stroke can lead to brain damage and can sometimes be life-threatening. If a person gets medical treatment right away, there is a better chance of surviving and recovering from a stroke. What can increase my risk? The following medical conditions may increase your risk of a stroke: Cardiovascular disease. High blood pressure (hypertension). Diabetes. High cholesterol. Sickle cell disease. Blood clotting disorders (hypercoagulable state). Obesity. Sleep disorders (obstructive sleep apnea). Other risk factors include: Being older than age 60. Having a history of blood clots, stroke, or mini-stroke (transient ischemic attack, TIA). Genetic factors, such as race, ethnicity, or a family history of stroke. Smoking cigarettes or using other tobacco products. Taking birth control pills, especially if you also use tobacco. Heavy use of alcohol or drugs, especially cocaine and methamphetamine. Physical inactivity. What actions can I take to prevent this? Manage your health conditions High cholesterol levels. Eating a healthy diet is important for preventing high cholesterol. If cholesterol cannot be managed through diet alone, you may need to take medicines. Take any prescribed medicines to control your cholesterol as told by your health care provider. Hypertension. To reduce your risk of stroke, try to keep your blood  pressure below 130/80. Eating a healthy diet and exercising regularly are important for controlling blood pressure. If these steps are not enough to manage your blood pressure, you may need to take medicines. Take any prescribed medicines to control hypertension as told by your health care provider. Ask your health care provider if you should monitor your blood pressure at home. Have your blood pressure checked every year, even if your blood pressure is normal. Blood pressure increases with age and some medical conditions. Diabetes. Eating a healthy diet and exercising regularly are important parts of managing your blood sugar (glucose). If your blood sugar cannot be managed through diet and exercise, you may need to take medicines. Take any prescribed medicines to control your diabetes as told by your health care provider. Get evaluated for obstructive sleep apnea. Talk to your health care provider about getting a sleep evaluation if you snore a lot or have excessive sleepiness. Make sure that any other medical conditions you have, such as atrial fibrillation or atherosclerosis, are managed. Nutrition Follow instructions from your health care provider about what to eat or drink to help manage your health condition. These instructions may include: Reducing your daily calorie intake. Limiting how much salt (sodium) you use to 1,500 milligrams (mg) each day. Using only healthy fats for cooking, such as olive oil, canola oil, or sunflower oil. Eating healthy foods. You can do this by: Choosing foods that are high in fiber, such as whole grains, and fresh fruits and vegetables. Eating at least 5 servings of fruits and vegetables a day. Try to fill one-half of your plate with fruits and vegetables at each meal. Choosing lean protein foods, such as lean cuts of meat, poultry without skin, fish, tofu, beans, and nuts. Eating low-fat dairy products. Avoiding   foods that are high in sodium. This can help  lower blood pressure. Avoiding foods that have saturated fat, trans fat, and cholesterol. This can help prevent high cholesterol. Avoiding processed and prepared foods. Counting your daily carbohydrate intake.  Lifestyle If you drink alcohol: Limit how much you have to: 0-1 drink a day for women who are not pregnant. 0-2 drinks a day for men. Know how much alcohol is in your drink. In the [place] one drink equals one 12 oz bottle of beer (355mL), one 5 oz glass of wine (148mL), or one 1 oz glass of hard liquor (44mL). Do not use any products that contain nicotine or tobacco. These products include cigarettes, chewing tobacco, and vaping devices, such as e-cigarettes. If you need help quitting, ask your health care provider. Avoid secondhand smoke. Do not use drugs. Activity  Try to stay at a healthy weight. Get at least 30 minutes of exercise on most days, such as: Fast walking. Biking. Swimming. Medicines Take over-the-counter and prescription medicines only as told by your health care provider. Aspirin or blood thinners (antiplatelets or anticoagulants) may be recommended to reduce your risk of forming blood clots that can lead to stroke. Avoid taking birth control pills. Talk to your health care provider about the risks of taking birth control pills if: You are over 35 years old. You smoke. You get very bad headaches. You have had a blood clot. Where to find more information American Stroke Association: www.strokeassociation.org Get help right away if: You or a loved one has any symptoms of a stroke. "BE FAST" is an easy way to remember the main warning signs of a stroke: B - Balance. Signs are dizziness, sudden trouble walking, or loss of balance. E - Eyes. Signs are trouble seeing or a sudden change in vision. F - Face. Signs are sudden weakness or numbness of the face, or the face or eyelid drooping on one side. A - Arms. Signs are weakness or numbness in an arm. This happens  suddenly and usually on one side of the body. S - Speech. Signs are sudden trouble speaking, slurred speech, or trouble understanding what people say. T - Time. Time to call emergency services. Write down what time symptoms started. You or a loved one has other signs of a stroke, such as: A sudden, severe headache with no known cause. Nausea or vomiting. Seizure. These symptoms may represent a serious problem that is an emergency. Do not wait to see if the symptoms will go away. Get medical help right away. Call your local emergency services (911 in the [place] Do not drive yourself to the hospital. Summary You can help to prevent a stroke by eating healthy, exercising, not smoking, limiting alcohol intake, and managing any medical conditions you may have. Do not use any products that contain nicotine or tobacco. These include cigarettes, chewing tobacco, and vaping devices, such as e-cigarettes. If you need help quitting, ask your health care provider. Remember "BE FAST" for warning signs of a stroke. Get help right away if you or a loved one has any of these signs. This information is not intended to replace advice given to you by your health care provider. Make sure you discuss any questions you have with your health care provider. Document Revised: 06/25/2020 Document Reviewed: 06/25/2020 [employer]vier Patient Education  2023 [employer]  

## 2022-09-10 NOTE — Progress Notes (Signed)
New Patient Office Visit  Subjective    Patient ID: James Lambert, male    DOB: 1966/05/02  Age: 56 y.o. MRN: [id number]  CC:  Chief Complaint  Patient presents with   New Patient (Initial Visit)    Patient is here to [place] as New Patient.    Medication Refill    Patient is requesting a refill on Thiamine 100 MG. Patient wants to discuss the prescription at today's visit with provider as he was prescribed the medication at [facility] per patient.    Recurrent Skin Infections    Patient says he currently has an boil and was prescribed antibiotics last Thursday by [facility], patient says it was I&D and is currently draining. Patient says he has a follow up with their office in two weeks.     HPI James Lambert presents for new patient visit to establish care.  Introduced to Designer, jewellery role and practice setting.  All questions answered.  Discussed provider/patient relationship and expectations.  HYPERTENSION / HYPERLIPIDEMIA Had 3 CVA recently -- in April and June with right side being affected, initially could not walk without a walker.  Is scheduled to see neurology in [place] next month, 10/20/22.  Currently taking Amlodipine, Metoprolol, Atorvastatin, and Plavix.    He has stopped drinking alcohol due to concerns for interactions with his medications -- used to drink heavily.  He is a current every day smoker, 7 cigarettes, started smoking in 30's -- was a 1 PPD smoker for many years, sometimes 2.  Continues to take Thiamine and Protonix for prevention with underlying GERD and alcohol use.  Satisfied with current treatment? yes Duration of hypertension: chronic BP monitoring frequency: not checking BP range:  BP medication side effects: no Duration of hyperlipidemia: chronic Cholesterol medication side effects: no Cholesterol supplements: none Medication compliance: good compliance Aspirin: no Recent stressors: no Recurrent  headaches: no Visual changes: no Palpitations: no Dyspnea: no Chest pain: no Lower extremity edema: no Dizzy/lightheaded: no  Flowsheet Row Office Visit from 09/10/2022 in [place]  AUDIT-C Score 5      [place] Office Visit from 09/10/2022 in [place]  Alcohol Use Disorder Identification Test Final Score (AUDIT) 7      SKIN INFECTION Diagnosed with Hidradenitis Suppurativa, currently has area to bottom and groin recently drained by general surgery who is following and needing more gauze to care for areas at home.  Currently taking Augmentin for this, started 3-4 days ago.  Scheduled to see dermatology, [facility], in January. Duration: months Location: bottom and groin History of trauma in area: no Pain: no Quality: no Severity: mild Redness: no Swelling: yes Oozing: yes Pus: no Fevers: no Nausea/vomiting: no Status: fluctuating Treatments attempted:antibiotics  Tetanus: UTD   KNEE PAIN Chronic to bilateral knees L>R.  Recent effusion drained left knee in hospital with MRI left knee noting diffuse degeneration and radial tearing of the lateral meniscus + severe OA.   Imaging right knee in January 2022 noted moderate to severe degenerative changes. Duration: chronic Involved knee: bilateral Mechanism of injury: unknown Location:diffuse Onset: gradual Severity: 7/10  Quality:  dull, aching, and throbbing Frequency: intermittent Radiation: no Aggravating factors: weight bearing, walking, stairs, bending, and movement  Alleviating factors: rest  Status: worse Treatments attempted: rest, ice, and heat  Relief with NSAIDs?:  No NSAIDs Taken Weakness with weight bearing or walking: yes Sensation of giving way: yes Locking: yes Popping: yes Bruising: no Swelling:  yes Redness: no Paresthesias/decreased sensation: no Fevers: no  Outpatient Encounter Medications as of 09/10/2022  Medication Sig   albuterol (VENTOLIN HFA) 108  (90 Base) MCG/ACT inhaler Inhale 1-2 puffs into the lungs every 6 (six) hours as needed for wheezing or shortness of breath.   amoxicillin-clavulanate (AUGMENTIN) 875-125 MG tablet Take 1 tablet by mouth 2 (two) times daily for 14 days.   folic acid (FOLVITE) 1 MG tablet Take 1 tablet (1 mg total) by mouth daily.   Multiple Vitamin (MULTIVITAMIN WITH MINERALS) TABS tablet Take 1 tablet by mouth daily.   [DISCONTINUED] acetaminophen (TYLENOL) 325 MG tablet Take 1-2 tablets (325-650 mg total) by mouth every 4 (four) hours as needed for mild pain.   [DISCONTINUED] amLODipine (NORVASC) 10 MG tablet Take 1 tablet (10 mg total) by mouth once daily.   [DISCONTINUED] atorvastatin (LIPITOR) 80 MG tablet Take 1 tablet (80 mg total) by mouth once daily.   [DISCONTINUED] clopidogrel (PLAVIX) 75 MG tablet Take 1 tablet (75 mg total) by mouth daily.   [DISCONTINUED] metoprolol succinate (TOPROL-XL) 50 MG 24 hr tablet Take 1 tablet (50 mg total) by mouth daily. Take with or immediately following a meal.   [DISCONTINUED] pantoprazole (PROTONIX) 40 MG tablet Take 1 tablet (40 mg total) by mouth at bedtime.   acetaminophen (TYLENOL) 325 MG tablet Take 2 tablets (650 mg total) by mouth every 6 (six) hours as needed for mild pain.   amLODipine (NORVASC) 10 MG tablet Take 1 tablet (10 mg total) by mouth once daily.   atorvastatin (LIPITOR) 80 MG tablet Take 1 tablet (80 mg total) by mouth once daily.   clopidogrel (PLAVIX) 75 MG tablet Take 1 tablet (75 mg total) by mouth daily.   metoprolol succinate (TOPROL-XL) 50 MG 24 hr tablet Take 1 tablet (50 mg total) by mouth daily. Take with or immediately following a meal.   pantoprazole (PROTONIX) 40 MG tablet Take 1 tablet (40 mg total) by mouth at bedtime.   thiamine (VITAMIN B-1) 100 MG tablet Take 1 tablet (100 mg total) by mouth daily.   [DISCONTINUED] acetaminophen (TYLENOL) 325 MG tablet Take 1-2 tablets (325-650 mg total) by mouth every 4 (four) hours as needed for  mild pain.   [DISCONTINUED] thiamine (VITAMIN B-1) 100 MG tablet Take 1 tablet (100 mg total) by mouth daily. (Patient not taking: Reported on 09/10/2022)   No facility-administered encounter medications on file as of 09/10/2022.    Past Medical History:  Diagnosis Date   Asthma    COPD (chronic obstructive pulmonary disease) (HCC)    Hidradenitis suppurativa    diagnosed in [facility] ED based on history and physical exam   Hypertension     Past Surgical History:  Procedure Laterality Date   IR CT HEAD LTD  05/10/2022   IR FLUORO GUIDED NEEDLE PLC ASPIRATION/INJECTION LOC  03/20/2022   IR PERCUTANEOUS ART THROMBECTOMY/INFUSION INTRACRANIAL INC DIAG ANGIO  05/10/2022   IR US GUIDE VASC ACCESS RIGHT  05/10/2022   RADIOLOGY WITH ANESTHESIA N/A 05/10/2022   Procedure: IR WITH ANESTHESIA;  Surgeon: Luanne Bras, MD;  Location: [facility];  Service: Radiology;  Laterality: N/A;   TEE WITHOUT CARDIOVERSION N/A 03/11/2022   Procedure: TRANSESOPHAGEAL ECHOCARDIOGRAM (TEE);  Surgeon: Corey Skains, MD;  Location: [facility];  Service: Cardiovascular;  Laterality: N/A;    Family History  Problem Relation Age of Onset   Diabetes Mother    Alzheimer's disease Mother    Hypertension Father    Bone cancer Father  Diabetes Brother    Hypertension Brother    Other Maternal Grandmother        unknown medical history   Other Maternal Grandfather        unknown medical history   Other Paternal Grandmother        unknown medical history   Other Paternal Grandfather        unknown medical history    Social History   Socioeconomic History   Marital status: Single    Spouse name: Not on file   Number of children: Not on file   Years of education: Not on file   Highest education level: Not on file  Occupational History   Not on file  Tobacco Use   Smoking status: Every Day    Packs/day: 0.25    Types: Cigarettes    Passive exposure: Past   Smokeless tobacco: Never   Tobacco comments:     Smoking 5-6 cigarettes per day  Vaping Use   Vaping Use: Never used  Substance and Sexual Activity   Alcohol use: Yes    Comment: last use 08/06/22, previous 2-3 40oz beer per day   Drug use: Yes    Frequency: 14.0 times per week    Types: Marijuana    Comment: 1-2 times per day   Sexual activity: Not on file  Other Topics Concern   Not on file  Social History Narrative   Not on file   Social Determinants of Health   Financial Resource Strain: Low Risk  (09/10/2022)   Overall Financial Resource Strain (CARDIA)    Difficulty of Paying Living Expenses: Not very hard  Food Insecurity: Food Insecurity Present (09/10/2022)   Hunger Vital Sign    Worried About Running Out of Food in the Last Year: Sometimes true    Ran Out of Food in the Last Year: Sometimes true  Transportation Needs: No Transportation Needs (09/10/2022)   PRAPARE - Hydrologist (Medical): No    Lack of Transportation (Non-Medical): No  Recent Concern: Transportation Needs - Unmet Transportation Needs (06/24/2022)   PRAPARE - Transportation    Lack of Transportation (Medical): Yes    Lack of Transportation (Non-Medical): Yes  Physical Activity: Insufficiently Active (09/10/2022)   Exercise Vital Sign    Days of Exercise per Week: 3 days    Minutes of Exercise per Session: 30 min  Stress: No Stress Concern Present (09/10/2022)   [place]    Feeling of Stress : Only a little  Social Connections: Socially Isolated (09/10/2022)   Social Connection and Isolation Panel [NHANES]    Frequency of Communication with Friends and Family: Three times a week    Frequency of Social Gatherings with Friends and Family: Three times a week    Attends Religious Services: Never    Active Member of Clubs or Organizations: No    Attends Archivist Meetings: Never    Marital Status: Separated  Intimate Partner Violence: Not At Risk  (09/10/2022)   Humiliation, Afraid, Rape, and Kick questionnaire    Fear of Current or Ex-Partner: No    Emotionally Abused: No    Physically Abused: No    Sexually Abused: No   Review of Systems  Constitutional:  Negative for chills, diaphoresis, fever and weight loss.  Respiratory:  Negative for cough, shortness of breath and wheezing.   Cardiovascular:  Negative for chest pain, palpitations, orthopnea and leg swelling.  Neurological:  Negative.   Endo/Heme/Allergies: Negative.   Psychiatric/Behavioral: Negative.        Objective    BP 115/75   Pulse 69   Temp 98.1 F (36.7 C) (Oral)   Ht 6\' 2"  (1.88 m)   Wt 259 lb 1.6 oz (117.5 kg)   SpO2 97%   BMI 33.27 kg/m   Physical Exam Vitals and nursing note reviewed.  Constitutional:      General: He is awake. He is not in acute distress.    Appearance: He is well-developed and well-groomed. He is obese. He is not ill-appearing or toxic-appearing.  HENT:     Head: Normocephalic and atraumatic.     Right Ear: Hearing and external ear normal. No drainage.     Left Ear: Hearing and external ear normal. No drainage.  Eyes:     General: Lids are normal.        Right eye: No discharge.        Left eye: No discharge.     Conjunctiva/sclera: Conjunctivae normal.     Pupils: Pupils are equal, round, and reactive to light.  Neck:     Thyroid: No thyromegaly.     Vascular: No carotid bruit.  Cardiovascular:     Rate and Rhythm: Normal rate and regular rhythm.     Heart sounds: Normal heart sounds, S1 normal and S2 normal. No murmur heard.    No gallop.  Pulmonary:     Effort: Pulmonary effort is normal. No accessory muscle usage or respiratory distress.     Breath sounds: Normal breath sounds.  Abdominal:     General: Bowel sounds are normal. There is no distension.     Palpations: Abdomen is soft.     Tenderness: There is no abdominal tenderness.  Musculoskeletal:     Cervical back: Normal range of motion and neck supple.      Right knee: Swelling and crepitus present. No erythema or bony tenderness. Decreased range of motion. Tenderness present over the medial joint line. Normal pulse.     Left knee: Swelling and crepitus present. No erythema or bony tenderness. Decreased range of motion. Tenderness present over the medial joint line. Normal pulse.     Right lower leg: 1+ Edema present.     Left lower leg: 1+ Edema present.     Comments: Mild genus valgum noted and antalgic gait present.  Lymphadenopathy:     Head:     Right side of head: No submental, submandibular, tonsillar, preauricular or posterior auricular adenopathy.     Left side of head: No submental, submandibular, tonsillar, preauricular or posterior auricular adenopathy.     Cervical: No cervical adenopathy.  Skin:    General: Skin is warm and dry.     Capillary Refill: Capillary refill takes less than 2 seconds.     Findings: No rash.  Neurological:     Mental Status: He is alert and oriented to person, place, and time.     Deep Tendon Reflexes: Reflexes are normal and symmetric.  Psychiatric:        Attention and Perception: Attention normal.        Mood and Affect: Mood normal.        Speech: Speech normal.        Behavior: Behavior normal. Behavior is cooperative.        Thought Content: Thought content normal.    Last CBC Lab Results  Component Value Date   WBC 7.3 06/09/2022   HGB 10.8 (  L) 06/09/2022   HCT 32.7 (L) 06/09/2022   MCV 90.3 06/09/2022   MCH 29.8 06/09/2022   RDW 13.2 06/09/2022   PLT 419 (H) 123456   Last metabolic panel Lab Results  Component Value Date   GLUCOSE 117 (H) 06/09/2022   NA 138 06/09/2022   K 3.5 06/09/2022   CL 105 06/09/2022   CO2 24 06/09/2022   BUN 7 06/09/2022   CREATININE 1.13 06/09/2022   GFRNONAA >60 06/09/2022   CALCIUM 8.6 (L) 06/09/2022   PHOS 2.5 05/11/2022   PROT 7.4 06/09/2022   ALBUMIN 3.0 (L) 06/09/2022   BILITOT 0.4 06/09/2022   ALKPHOS 93 06/09/2022   AST 16  06/09/2022   ALT 12 06/09/2022   ANIONGAP 9 06/09/2022   Last lipids Lab Results  Component Value Date   CHOL 140 05/11/2022   HDL 32 (L) 05/11/2022   LDLCALC 91 05/11/2022   TRIG 87 05/11/2022   CHOLHDL 4.4 05/11/2022   Last hemoglobin A1c Lab Results  Component Value Date   HGBA1C 5.3 03/19/2022    Assessment & Plan:   Problem List Items Addressed This Visit       Cardiovascular and Mediastinum   Aortic atherosclerosis ([place])    Noted on imaging 06/08/22.  Recommend complete cessation of smoking + continue daily Plavix and statin for prevention.      Relevant Medications   amLODipine (NORVASC) 10 MG tablet   atorvastatin (LIPITOR) 80 MG tablet   metoprolol succinate (TOPROL-XL) 50 MG 24 hr tablet   Hypertension    Chronic, stable at this time with BP below stroke prevention goal - <130/80.  Recommend she monitor BP at least a few mornings a week at home and document.  DASH diet at home.  Continue current medication regimen and adjust as needed, refills sent in.  Labs today: CBC, CMP, TSH.  Return in December for follow-up.       Relevant Medications   amLODipine (NORVASC) 10 MG tablet   atorvastatin (LIPITOR) 80 MG tablet   metoprolol succinate (TOPROL-XL) 50 MG 24 hr tablet   Other Relevant Orders   CBC with Differential/Platelet   Comprehensive metabolic panel   TSH     Digestive   GERD without esophagitis    Chronic, ongoing.  At this time continue Protonix as is on Plavix and continues to drink alcohol daily.  Risks of PPI use were discussed with patient including bone loss, C. Diff diarrhea, pneumonia, infections, CKD, electrolyte abnormalities. Verbalizes understanding and chooses to continue the medication.  Mag level today.       Relevant Medications   pantoprazole (PROTONIX) 40 MG tablet   Other Relevant Orders   Magnesium     Nervous and Auditory   Hemiparesis affecting right side as late effect of stroke (HCC) - Primary    Ongoing, mild to right  side post CVA.  Did attend PT for this. May need to return in future if any changes present.  At this time continue current stroke prevention medication regimen and collaboration with neurology, scheduled to see them upcoming.      Seizure cerebral ([place])    Stable at this time, presented during CVA -- at this time no seizure medications on board.  Is scheduled to see neurology 10/20/22, will continue collaboration with them and review notes when available.        Musculoskeletal and Integument   Hidradenitis suppurativa    Chronic, ongoing issue.  Being followed by general surgery at this  time, continue this collaboration and provided small amount of dressing supplies today.  May benefit home health nursing in future to assist with dressing changes.  Scheduled to see dermatology in upcoming months, which will be beneficial.  Treat infections as needed.      Relevant Orders   CBC with Differential/Platelet     Other   Alcohol abuse    Chronic, ongoing issue.  He has cut back, however recommend complete cessation of alcohol use to help prevent elevation in BP and stroke.  Check CMP, B12, and B1 today.      Relevant Orders   CBC with Differential/Platelet   Vitamin B12   Vitamin B1   Bilateral chronic knee pain    Chronic, ongoing issue with MRI left knee in hospital noting diffuse degeneration and radial tearing of the lateral meniscus + severe OA & previous right knee imaging noting severe OA.  Would benefit ortho referral, placed today and discussed with patient.  Recommend he start taking Tylenol at home, recent LFT stable, may take 1000 MG TID PRN.  Voltaren gel OTC as needed + alternate heat and ice.      Relevant Medications   acetaminophen (TYLENOL) 325 MG tablet   Other Relevant Orders   Ambulatory referral to Orthopedics   History of CVA (cerebrovascular accident)    Three CVA this past year -- April and June, most recent 05/10/22.  Is scheduled to see neurology on 10/20/22,  appreciate their input.  Has family history of CVA, brother.  Recommend continue all current medications and discussed neuro goals: A1c <6.5%, LDL <70, and BP <130/80.  High risk for recurrent CVA, monitor closely and recommend complete cessation of smoking and alcohol use.      Mixed hyperlipidemia    Chronic, ongoing.  Continue current medication regimen and adjust as needed.  Lipid panel today.       Relevant Medications   amLODipine (NORVASC) 10 MG tablet   atorvastatin (LIPITOR) 80 MG tablet   clopidogrel (PLAVIX) 75 MG tablet   metoprolol succinate (TOPROL-XL) 50 MG 24 hr tablet   Other Relevant Orders   Comprehensive metabolic panel   Lipid Panel w/o Chol/HDL Ratio   Nicotine dependence, cigarettes, uncomplicated    I have recommended complete cessation of tobacco use. I have discussed various options available for assistance with tobacco cessation including over the counter methods (Nicotine gum, patch and lozenges). We also discussed prescription options (Chantix, Nicotine Inhaler / Nasal Spray). The patient is not interested in pursuing any prescription tobacco cessation options at this time.       Other Visit Diagnoses     IFG (impaired fasting glucose)       Noted on past labs and family history of diabetes -- check A1c today,   Relevant Orders   HgB A1c   Benign prostatic hyperplasia without lower urinary tract symptoms       PSA check on labs today, discussed with patient.   Relevant Orders   PSA   Flu vaccine need       Flu vaccine in office today.   Relevant Orders   Flu Vaccine QUAD 6+ mos PF IM (Fluarix Quad PF) (Completed)   Encounter to establish care       New patient to clinic, introduced to practice and provider.       Return in about 10 weeks (around 11/19/2022) for HTN/HLD, H/O CVA.   Venita Lick, NP

## 2022-09-10 NOTE — Assessment & Plan Note (Addendum)
Chronic, ongoing.  At this time continue Protonix as is on Plavix and continues to drink alcohol daily.  Risks of PPI use were discussed with patient including bone loss, C. Diff diarrhea, pneumonia, infections, CKD, electrolyte abnormalities. Verbalizes understanding and chooses to continue the medication.  Mag level today.

## 2022-09-10 NOTE — Assessment & Plan Note (Addendum)
Chronic, ongoing issue.  Being followed by general surgery at this time, continue this collaboration and provided small amount of dressing supplies today.  May benefit home health nursing in future to assist with dressing changes.  Scheduled to see dermatology in upcoming months, which will be beneficial.  Treat infections as needed.

## 2022-09-10 NOTE — Assessment & Plan Note (Addendum)
Noted on imaging 06/08/22.  Recommend complete cessation of smoking + continue daily Plavix and statin for prevention.

## 2022-09-11 ENCOUNTER — Telehealth: Payer: Self-pay

## 2022-09-11 ENCOUNTER — Encounter: Payer: Self-pay | Admitting: Nurse Practitioner

## 2022-09-11 DIAGNOSIS — R7309 Other abnormal glucose: Secondary | ICD-10-CM | POA: Insufficient documentation

## 2022-09-11 NOTE — Telephone Encounter (Signed)
Faxed medical records to Disability Determination Services at [phone]. 

## 2022-09-11 NOTE — Progress Notes (Signed)
Good morning, please let James Lambert know his labs have returned: - CBC is showing some improvement in anemia seen on previous labs three months ago, we will recheck next visit. - Kidney function remains normal, as is liver function.  Recommend continued cessation of alcohol use (no use). - Cholesterol labs are at goal, continue taking Atorvastatin every day. - A1c, diabetes testing, is showing a level at prediabetic range = your number is 5.7% and any number 5.7 to 6.4% is considered prediabetes.  I recommend heavy focus on diet changes and regular activity once knees are improved.  If you do not hear from ortho to look at knees let me know, there is a referral in place:) - Remainder of your labs are all stable.  Any questions? Keep being amazing!!  Thank you for allowing me to participate in your care.  I appreciate you. Kindest regards, Tyde Lamison

## 2022-09-14 LAB — CBC WITH DIFFERENTIAL/PLATELET
Basophils Absolute: 0 10*3/uL (ref 0.0–0.2)
Basos: 0 %
EOS (ABSOLUTE): 0.3 10*3/uL (ref 0.0–0.4)
Eos: 4 %
Hematocrit: 37.8 % (ref 37.5–51.0)
Hemoglobin: 12.7 g/dL — ABNORMAL LOW (ref 13.0–17.7)
Immature Grans (Abs): 0.1 10*3/uL (ref 0.0–0.1)
Immature Granulocytes: 1 %
Lymphocytes Absolute: 2.7 10*3/uL (ref 0.7–3.1)
Lymphs: 37 %
MCH: 29.5 pg (ref 26.6–33.0)
MCHC: 33.6 g/dL (ref 31.5–35.7)
MCV: 88 fL (ref 79–97)
Monocytes Absolute: 0.6 10*3/uL (ref 0.1–0.9)
Monocytes: 9 %
Neutrophils Absolute: 3.5 10*3/uL (ref 1.4–7.0)
Neutrophils: 49 %
Platelets: 392 10*3/uL (ref 150–450)
RBC: 4.31 x10E6/uL (ref 4.14–5.80)
RDW: 14.1 % (ref 11.6–15.4)
WBC: 7.3 10*3/uL (ref 3.4–10.8)

## 2022-09-14 LAB — COMPREHENSIVE METABOLIC PANEL
ALT: 6 IU/L (ref 0–44)
AST: 7 IU/L (ref 0–40)
Albumin/Globulin Ratio: 1.1 — ABNORMAL LOW (ref 1.2–2.2)
Albumin: 4.3 g/dL (ref 3.8–4.9)
Alkaline Phosphatase: 87 IU/L (ref 44–121)
BUN/Creatinine Ratio: 12 (ref 9–20)
BUN: 15 mg/dL (ref 6–24)
Bilirubin Total: 0.3 mg/dL (ref 0.0–1.2)
CO2: 21 mmol/L (ref 20–29)
Calcium: 9.4 mg/dL (ref 8.7–10.2)
Chloride: 103 mmol/L (ref 96–106)
Creatinine, Ser: 1.27 mg/dL (ref 0.76–1.27)
Globulin, Total: 4 g/dL (ref 1.5–4.5)
Glucose: 80 mg/dL (ref 70–99)
Potassium: 4.2 mmol/L (ref 3.5–5.2)
Sodium: 140 mmol/L (ref 134–144)
Total Protein: 8.3 g/dL (ref 6.0–8.5)
eGFR: 67 mL/min/{1.73_m2} (ref >59)

## 2022-09-14 LAB — VITAMIN B1: Thiamine: 125.5 nmol/L (ref 66.5–200.0)

## 2022-09-14 LAB — LIPID PANEL W/O CHOL/HDL RATIO
Cholesterol, Total: 95 mg/dL — ABNORMAL LOW (ref 100–199)
HDL: 33 mg/dL — ABNORMAL LOW (ref >39)
LDL Chol Calc (NIH): 49 mg/dL (ref 0–99)
Triglycerides: 54 mg/dL (ref 0–149)
VLDL Cholesterol Cal: 13 mg/dL (ref 5–40)

## 2022-09-14 LAB — MAGNESIUM: Magnesium: 2 mg/dL (ref 1.6–2.3)

## 2022-09-14 LAB — PSA: Prostate Specific Ag, Serum: 0.4 ng/mL (ref 0.0–4.0)

## 2022-09-14 LAB — HEMOGLOBIN A1C
Est. average glucose Bld gHb Est-mCnc: 117 mg/dL
Hgb A1c MFr Bld: 5.7 % — ABNORMAL HIGH (ref 4.8–5.6)

## 2022-09-14 LAB — TSH: TSH: 1.06 u[IU]/mL (ref 0.450–4.500)

## 2022-09-14 LAB — VITAMIN B12: Vitamin B-12: 421 pg/mL (ref 232–1245)

## 2022-09-22 ENCOUNTER — Encounter: Payer: Self-pay | Admitting: Surgery

## 2022-09-22 ENCOUNTER — Ambulatory Visit (INDEPENDENT_AMBULATORY_CARE_PROVIDER_SITE_OTHER): Payer: Medicaid Other | Admitting: Surgery

## 2022-09-22 ENCOUNTER — Other Ambulatory Visit: Payer: Self-pay

## 2022-09-22 VITALS — BP 160/81 | HR 67 | Temp 98.7°F | Ht 74.0 in | Wt 264.0 lb

## 2022-09-22 DIAGNOSIS — Z09 Encounter for follow-up examination after completed treatment for conditions other than malignant neoplasm: Secondary | ICD-10-CM

## 2022-09-22 DIAGNOSIS — L732 Hidradenitis suppurativa: Secondary | ICD-10-CM

## 2022-09-22 MED ORDER — AMOXICILLIN-POT CLAVULANATE 875-125 MG PO TABS: 1.0000 | ORAL_TABLET | Freq: Two times a day (BID) | ORAL | 0 refills | Status: DC

## 2022-09-22 NOTE — Patient Instructions (Signed)
Please pick up your medication at the pharmacy.  

## 2022-09-22 NOTE — Progress Notes (Signed)
09/22/2022  HPI: James Lambert is a 56 y.o. male s/p I&D of left buttocks and left groin/perineal hidradenitis.  Patient presents for follow up.  Reports he's doing better, but there's an area that's still draining.  He was seen about two weeks ago with a possible new area of hidradenitis.  He was treated with antibiotic course.  Denies any worsening pain.  The prior I&D sites are healing well.  Vital signs: BP (!) 160/81   Pulse 67   Temp 98.7 F (37.1 C) (Oral)   Ht 6\' 2"  (1.88 m)   Wt 264 lb (119.7 kg)   SpO2 98%   BMI 33.90 kg/m    Physical Exam: Constitutional:  No acute distress Skin:  I&D sites healing well, with only the superior left buttocks wound still open, with serous drainage.  No significant induration/erythema.  There is an area in the left groin that has some mild purulent drainage, but is not tender.  Assessment/Plan: This is a 56 y.o. male s/p I&D of hidradenitis  --Prior I&D sites are healing appropriately.  No further purulence noted there, but a more recent area has mild purulence.  Will give him a course of Augmentin. --He's still pending appointment with Dermatology in January.  Strongly recommended that he keep this appointment. --Follow up 1 month.   Melvyn Neth, [facility]

## 2022-10-20 ENCOUNTER — Encounter: Payer: Self-pay | Admitting: Neurology

## 2022-10-20 ENCOUNTER — Ambulatory Visit (INDEPENDENT_AMBULATORY_CARE_PROVIDER_SITE_OTHER): Payer: Medicaid Other | Admitting: Neurology

## 2022-10-20 VITALS — BP 130/84 | HR 67 | Ht 74.0 in | Wt 263.0 lb

## 2022-10-20 DIAGNOSIS — I639 Cerebral infarction, unspecified: Secondary | ICD-10-CM

## 2022-10-20 DIAGNOSIS — I6522 Occlusion and stenosis of left carotid artery: Secondary | ICD-10-CM | POA: Diagnosis not present

## 2022-10-20 NOTE — Progress Notes (Signed)
[facility] [address] [place]. [place] [address] [phone]       OFFICE FOLLOW-UP NOTE  James Lambert Date of Birth:  Dec 11, 1965 Medical Record Number:  [id number]   HPI: James Lambert is a 56 year old African-American male seen today for initial office follow-up visit following hospital admission for stroke in June 2023.  History is obtained from the patient and review of electronic medical records and I have personally reviewed pertinent available imaging films in PACS.   James Lambert is an 56 y.o. male with a PMHx of HTN, COPD, hidradenitis suppurativa and stroke with residual right sided weakness who presents from his group home after his brother, who had been drinking with him there, noted the patient to suddenly become confused and aphasic. EMS was called and on arrival they noted same. Brother stated that although thye were drinking, the patient's alcohol intake today was no different than on other days. Code Stroke was called in the field. Neurologist spoke with the patient's brother [name] by telephone [phone]). Patient's brother states that the patient had "4 or 5 strokes" in the last 3 months, but that at baseline the patient has no easily discernible deficits, other than that a friend is able to note mild right sided weakness. Prior to onset of the symptoms above, the patient was "walking and talking like he normally does". Patient's brother states that at 60, while drinking together, James Lambert suddenly started not making sense and also appeared to have a right facial droop, so EMS was called. On EMS arrival, the patient was aphasic with right sided weakness. Patient's brother also states that the patient's daily intake of EtOH is approximately 120 ounces of beer and and 12 ounces of hard liquor. This evening, the patient had only drunk 2 ounces of hard liquor prior to onset of his symptoms.  His NIH stroke scale was 15 on admission.  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  CT perfusion showed 4 cc core infarct in the anterior left frontal lobe with large surrounding ischemic penumbra with mismatch volume of 236 ml.  He was given IV TNK at outside hospital and transferred to [facility] for emergent thrombectomy for proximal left carotid occlusion which was performed successfully with TICI 3 revascularization.  He was extubated and did very well.  Aphasia improved and neurology mild right-sided weakness.  MRI scan of the brain showed scattered small acute infarcts in the left cerebral hemisphere with additional punctate acute infarct in the right posterior limb internal capsule.  2D echo showed ejection fraction of 60 to 65%.  LDL cholesterol is 91 mg percent.  Hemoglobin A1c of 5.3.  He was started on aspirin and Plavix for 3 weeks followed by Plavix alone.  Patient has been very well and states his right-sided weakness has practically improved completely.  He has noticed only slight diminished fine motor skills.  Patient with main be bothered by severe neck pain and he plans to discuss this with his primary physician to consider more aggressive treatment.  He is able to ambulate with a cane.  He is tolerating aspirin well without bruising or bleeding Lipitor without muscle aches and pains.  Blood pressure is well controlled and today it is 130/84.  He has no new complaints.  He was hospitalized in July to have abscess drainage for his hidranetis suppurutiva involving both thighs..  He is still currently on antibiotics for the same  ROS:   14 system review of systems is positive for weakness, skin infection, abscess drainage all other systems negative  PMH:  Past Medical History:  Diagnosis Date   Asthma    COPD (chronic obstructive pulmonary disease) (HCC)    Hidradenitis suppurativa    [facility] based on history and physical exam   Hypertension     Social History:  Social  History   Socioeconomic History   Marital status: Single    Spouse name: Not on file   Number of children: Not on file   Years of education: Not on file   Highest education level: Not on file  Occupational History   Not on file  Tobacco Use   Smoking status: Every Day    Packs/day: 0.25    Types: Cigarettes    Passive exposure: Past   Smokeless tobacco: Never   Tobacco comments:    Smoking 5-6 cigarettes per day  Vaping Use   Vaping Use: Never used  Substance and Sexual Activity   Alcohol use: Yes    Comment: last use 08/06/22, previous 2-3 40oz beer per day   Drug use: Yes    Frequency: 14.0 times per week    Types: Marijuana    Comment: 1-2 times per day   Sexual activity: Not on file  Other Topics Concern   Not on file  Social History Narrative   Not on file   Social Determinants of Health   Financial Resource Strain: Low Risk  (09/10/2022)   Overall Financial Resource Strain (CARDIA)    Difficulty of Paying Living Expenses: Not very hard  Food Insecurity: Food Insecurity Present (09/10/2022)   Hunger Vital Sign    Worried About Running Out of Food in the Last Year: Sometimes true    Ran Out of Food in the Last Year: Sometimes true  Transportation Needs: No Transportation Needs (09/10/2022)   PRAPARE - Administrator, Civil ServiceTransportation    Lack of Transportation (Medical): No    Lack of Transportation (Non-Medical): No  Recent Concern: Transportation Needs - Unmet Transportation Needs (06/24/2022)   PRAPARE - Transportation    Lack of Transportation (Medical): Yes    Lack of Transportation (Non-Medical): Yes  Physical Activity: Insufficiently Active (09/10/2022)   Exercise Vital Sign    Days of Exercise per Week: 3 days    Minutes of Exercise per Session: 30 min  Stress: No Stress Concern Present (09/10/2022)   [employer] - Occupational Stress Questionnaire    Feeling of Stress : Only a little  Social Connections: Socially Isolated (09/10/2022)   Social  Connection and Isolation Panel [NHANES]    Frequency of Communication with Friends and Family: Three times a week    Frequency of Social Gatherings with Friends and Family: Three times a week    Attends Religious Services: Never    Active Member of Clubs or Organizations: No    Attends BankerClub or Organization Meetings: Never    Marital Status: Separated  Intimate Partner Violence: Not At Risk (09/10/2022)   Humiliation, Afraid, Rape, and Kick questionnaire    Fear of Current or Ex-Partner: No    Emotionally Abused: No    Physically Abused: No    Sexually Abused: No    Medications:   Current Outpatient Medications on File Prior to Visit  Medication Sig Dispense Refill   acetaminophen (TYLENOL) 325 MG tablet Take 2 tablets (650 mg total) by mouth every 6 (six) hours as needed for mild pain. 120  tablet 12   albuterol (VENTOLIN HFA) 108 (90 Base) MCG/ACT inhaler Inhale 1-2 puffs into the lungs every 6 (six) hours as needed for wheezing or shortness of breath. 18 g 3   amLODipine (NORVASC) 10 MG tablet Take 1 tablet (10 mg total) by mouth once daily. 90 tablet 4   atorvastatin (LIPITOR) 80 MG tablet Take 1 tablet (80 mg total) by mouth once daily. 90 tablet 4   clopidogrel (PLAVIX) 75 MG tablet Take 1 tablet (75 mg total) by mouth daily. 90 tablet 4   folic acid (FOLVITE) 1 MG tablet Take 1 tablet (1 mg total) by mouth daily. 90 tablet 1   metoprolol succinate (TOPROL-XL) 50 MG 24 hr tablet Take 1 tablet (50 mg total) by mouth daily. Take with or immediately following a meal. 90 tablet 4   Multiple Vitamin (MULTIVITAMIN WITH MINERALS) TABS tablet Take 1 tablet by mouth daily. 30 tablet 0   pantoprazole (PROTONIX) 40 MG tablet Take 1 tablet (40 mg total) by mouth at bedtime. 90 tablet 4   thiamine (VITAMIN B-1) 100 MG tablet Take 1 tablet (100 mg total) by mouth daily. 90 tablet 4   No current facility-administered medications on file prior to visit.    Allergies:  No Known  Allergies  Physical Exam General: Mildly obese middle-aged African-American male seated, in no evident distress Head: head normocephalic and atraumatic.  Neck: supple with no carotid or supraclavicular bruits Cardiovascular: regular rate and rhythm, no murmurs Musculoskeletal: no deformity Skin:  no rash/petichiae Vascular:  Normal pulses all extremities Vitals:   10/20/22 1058  BP: 130/84  Pulse: 67   Neurologic Exam Mental Status: Awake and fully alert. Oriented to place and time. Recent and remote memory intact. Attention span, concentration and fund of knowledge appropriate. Mood and affect appropriate.  Cranial Nerves: Fundoscopic exam reveals sharp disc margins. Pupils equal, briskly reactive to light. Extraocular movements full without nystagmus. Visual fields full to confrontation. Hearing intact. Facial sensation intact. Face, tongue, palate moves normally and symmetrically.  Motor: Normal bulk and tone. Normal strength in all tested extremity muscles.  Diminished fine finger movements on the left.  Orbits right over left upper extremity. Sensory.: intact to touch ,pinprick .position and vibratory sensation.  Coordination: Rapid alternating movements normal in all extremities. Finger-to-nose and heel-to-shin performed accurately bilaterally. Gait and Station: Arises from chair without difficulty. Stance is normal. Gait favors left knee due to pain and uses a cane. Reflexes: 1+ and symmetric. Toes downgoing.   NIHSS  0 Modified Rankin  2   ASSESSMENT: 56 year old male with left MCA small infarct can lead to proximal left ICA occlusion treated with IV thrombolysis with TNK followed by successful mechanical thrombectomy with TICI 3 revascularization June 2023.  Vascular risk factors hypertension hyperlipidemia and mild obesity     PLAN:I had a long d/w patient about his recent stroke, carotid occlusion and revascularization, risk for recurrent stroke/TIAs, personally  independently reviewed imaging studies and stroke evaluation results and answered questions.Continue Plavix 75 mg daily  for secondary stroke prevention and maintain strict control of hypertension with blood pressure goal below 130/90, diabetes with hemoglobin A1c goal below 6.5% and lipids with LDL cholesterol goal below 70 mg/dL. I also advised the patient to eat a healthy diet with plenty of whole grains, cereals, fruits and vegetables, exercise regularly and maintain ideal body weight .I have advised the patient to see his primary care physician for his severe left knee pain which seems to be affecting his  walking..  Patient may hold Plavix for 3 to 5 days prior to surgery or injection if necessary with a small but acceptable periprocedural risk of TIA necessary.Greater than 50% of time during this 35 minute visit was spent on counseling,explanation of diagnosis, planning of further management, discussion with patient and family and coordination of care Delia Heady, MD Note: This document was prepared with digital dictation and possible smart phrase technology. Any transcriptional errors that result from this process are unintentional

## 2022-10-20 NOTE — Patient Instructions (Signed)
I had a long d/w patient about his recent stroke, carotid occlusion and revascularization, risk for recurrent stroke/TIAs, personally independently reviewed imaging studies and stroke evaluation results and answered questions.Continue Plavix 75 mg daily  for secondary stroke prevention and maintain strict control of hypertension with blood pressure goal below 130/90, diabetes with hemoglobin A1c goal below 6.5% and lipids with LDL cholesterol goal below 70 mg/dL. I also advised the patient to eat a healthy diet with plenty of whole grains, cereals, fruits and vegetables, exercise regularly and maintain ideal body weight .I have advised the patient to see his primary care physician for his severe left knee pain which seems to be affecting his walking..  Patient may hold Plavix for 3 to 5 days prior to surgery or injection if necessary with a small but acceptable periprocedural risk of TIA necessary.  Stroke Prevention Some medical conditions and behaviors can lead to a higher chance of having a stroke. You can help prevent a stroke by eating healthy, exercising, not smoking, and managing any medical conditions you have. Stroke is a leading cause of functional impairment. Primary prevention is particularly important because a majority of strokes are first-time events. Stroke changes the lives of not only those who experience a stroke but also their family and other caregivers. How can this condition affect me? A stroke is a medical emergency and should be treated right away. A stroke can lead to brain damage and can sometimes be life-threatening. If a person gets medical treatment right away, there is a better chance of surviving and recovering from a stroke. What can increase my risk? The following medical conditions may increase your risk of a stroke: Cardiovascular disease. High blood pressure (hypertension). Diabetes. High cholesterol. Sickle cell disease. Blood clotting disorders (hypercoagulable  state). Obesity. Sleep disorders (obstructive sleep apnea). Other risk factors include: Being older than age 45. Having a history of blood clots, stroke, or mini-stroke (transient ischemic attack, TIA). Genetic factors, such as race, ethnicity, or a family history of stroke. Smoking cigarettes or using other tobacco products. Taking birth control pills, especially if you also use tobacco. Heavy use of alcohol or drugs, especially cocaine and methamphetamine. Physical inactivity. What actions can I take to prevent this? Manage your health conditions High cholesterol levels. Eating a healthy diet is important for preventing high cholesterol. If cholesterol cannot be managed through diet alone, you may need to take medicines. Take any prescribed medicines to control your cholesterol as told by your health care provider. Hypertension. To reduce your risk of stroke, try to keep your blood pressure below 130/80. Eating a healthy diet and exercising regularly are important for controlling blood pressure. If these steps are not enough to manage your blood pressure, you may need to take medicines. Take any prescribed medicines to control hypertension as told by your health care provider. Ask your health care provider if you should monitor your blood pressure at home. Have your blood pressure checked every year, even if your blood pressure is normal. Blood pressure increases with age and some medical conditions. Diabetes. Eating a healthy diet and exercising regularly are important parts of managing your blood sugar (glucose). If your blood sugar cannot be managed through diet and exercise, you may need to take medicines. Take any prescribed medicines to control your diabetes as told by your health care provider. Get evaluated for obstructive sleep apnea. Talk to your health care provider about getting a sleep evaluation if you snore a lot or have excessive sleepiness. Make  sure that any other  medical conditions you have, such as atrial fibrillation or atherosclerosis, are managed. Nutrition Follow instructions from your health care provider about what to eat or drink to help manage your health condition. These instructions may include: Reducing your daily calorie intake. Limiting how much salt (sodium) you use to 1,500 milligrams (mg) each day. Using only healthy fats for cooking, such as olive oil, canola oil, or sunflower oil. Eating healthy foods. You can do this by: Choosing foods that are high in fiber, such as whole grains, and fresh fruits and vegetables. Eating at least 5 servings of fruits and vegetables a day. Try to fill one-half of your plate with fruits and vegetables at each meal. Choosing lean protein foods, such as lean cuts of meat, poultry without skin, fish, tofu, beans, and nuts. Eating low-fat dairy products. Avoiding foods that are high in sodium. This can help lower blood pressure. Avoiding foods that have saturated fat, trans fat, and cholesterol. This can help prevent high cholesterol. Avoiding processed and prepared foods. Counting your daily carbohydrate intake.  Lifestyle If you drink alcohol: Limit how much you have to: 0-1 drink a day for women who are not pregnant. 0-2 drinks a day for men. Know how much alcohol is in your drink. In the [place] one drink equals one 12 oz bottle of beer (3101m), one 5 oz glass of wine (1433m, or one 1 oz glass of hard liquor (4440m Do not use any products that contain nicotine or tobacco. These products include cigarettes, chewing tobacco, and vaping devices, such as e-cigarettes. If you need help quitting, ask your health care provider. Avoid secondhand smoke. Do not use drugs. Activity  Try to stay at a healthy weight. Get at least 30 minutes of exercise on most days, such as: Fast walking. Biking. Swimming. Medicines Take over-the-counter and prescription medicines only as told by your health care  provider. Aspirin or blood thinners (antiplatelets or anticoagulants) may be recommended to reduce your risk of forming blood clots that can lead to stroke. Avoid taking birth control pills. Talk to your health care provider about the risks of taking birth control pills if: You are over 35 21ars old. You smoke. You get very bad headaches. You have had a blood clot. Where to find more information American Stroke Association: www.strokeassociation.org Get help right away if: You or a loved one has any symptoms of a stroke. "BE FAST" is an easy way to remember the main warning signs of a stroke: B - Balance. Signs are dizziness, sudden trouble walking, or loss of balance. E - Eyes. Signs are trouble seeing or a sudden change in vision. F - Face. Signs are sudden weakness or numbness of the face, or the face or eyelid drooping on one side. A - Arms. Signs are weakness or numbness in an arm. This happens suddenly and usually on one side of the body. S - Speech. Signs are sudden trouble speaking, slurred speech, or trouble understanding what people say. T - Time. Time to call emergency services. Write down what time symptoms started. You or a loved one has other signs of a stroke, such as: A sudden, severe headache with no known cause. Nausea or vomiting. Seizure. These symptoms may represent a serious problem that is an emergency. Do not wait to see if the symptoms will go away. Get medical help right away. Call your local emergency services (911 in the [place] Do not drive yourself to the hospital. Summary You can  help to prevent a stroke by eating healthy, exercising, not smoking, limiting alcohol intake, and managing any medical conditions you may have. Do not use any products that contain nicotine or tobacco. These include cigarettes, chewing tobacco, and vaping devices, such as e-cigarettes. If you need help quitting, ask your health care provider. Remember "BE FAST" for warning signs of a  stroke. Get help right away if you or a loved one has any of these signs. This information is not intended to replace advice given to you by your health care provider. Make sure you discuss any questions you have with your health care provider. Document Revised: 06/25/2020 Document Reviewed: 06/25/2020 Elsevier Patient Education  2023 [employer]. .

## 2022-10-27 ENCOUNTER — Other Ambulatory Visit: Payer: Self-pay

## 2022-10-27 ENCOUNTER — Encounter: Payer: Self-pay | Admitting: Surgery

## 2022-10-27 ENCOUNTER — Telehealth: Payer: Self-pay

## 2022-10-27 ENCOUNTER — Ambulatory Visit (INDEPENDENT_AMBULATORY_CARE_PROVIDER_SITE_OTHER): Payer: Medicaid Other | Admitting: Surgery

## 2022-10-27 VITALS — BP 117/74 | HR 65 | Temp 98.1°F | Ht 74.0 in | Wt 259.0 lb

## 2022-10-27 DIAGNOSIS — L732 Hidradenitis suppurativa: Secondary | ICD-10-CM | POA: Diagnosis not present

## 2022-10-27 MED ORDER — AMOXICILLIN-POT CLAVULANATE 875-125 MG PO TABS: 1.0000 | ORAL_TABLET | Freq: Two times a day (BID) | ORAL | 0 refills | Status: AC

## 2022-10-27 NOTE — Progress Notes (Signed)
10/27/2022  History of Present Illness: James Lambert is a 56 y.o. male presenting for follow up of hidradenitis.  Patient has a long history of hidradenitis and was seen initially in the hospital in July for hidradenitis of the left buttocks, left groin and perineum.  He had just had a recent stroke which required thrombectomy in June 2023 and he was on aspirin and Plavix.  In light of that, no formal I&D was done and over the last few months, he was treated with antibiotics he did have a small I&D procedure in the office on 07/28/2022.  However despite of that, he continues to have draining issues.  He is still draining over the left buttocks, left groin, and reports today that he has a new area in the right buttocks that just recently developed.  Has not had drainage from there but is tender.  Of note, he recently saw his neurologist on 10/20/2022, and per their note, it may be that he could come off Plavix for surgery.  Past Medical History: Past Medical History:  Diagnosis Date   Asthma    COPD (chronic obstructive pulmonary disease) (HCC)    Hidradenitis suppurativa    diagnosed in [facility] ED based on history and physical exam   Hypertension      Past Surgical History: Past Surgical History:  Procedure Laterality Date   IR CT HEAD LTD  05/10/2022   IR FLUORO GUIDED NEEDLE PLC ASPIRATION/INJECTION LOC  03/20/2022   IR PERCUTANEOUS ART THROMBECTOMY/INFUSION INTRACRANIAL INC DIAG ANGIO  05/10/2022   IR US GUIDE VASC ACCESS RIGHT  05/10/2022   RADIOLOGY WITH ANESTHESIA N/A 05/10/2022   Procedure: IR WITH ANESTHESIA;  Surgeon: Julieanne Cotton, MD;  Location: [facility] OR;  Service: Radiology;  Laterality: N/A;   TEE WITHOUT CARDIOVERSION N/A 03/11/2022   Procedure: TRANSESOPHAGEAL ECHOCARDIOGRAM (TEE);  Surgeon: Lamar Blinks, MD;  Location: [facility] ORS;  Service: Cardiovascular;  Laterality: N/A;    Home Medications: Prior to Admission medications   Medication Sig Start Date End Date Taking?  Authorizing Provider  acetaminophen (TYLENOL) 325 MG tablet Take 2 tablets (650 mg total) by mouth every 6 (six) hours as needed for mild pain. 09/10/22  Yes Cannady, Jolene T, NP  albuterol (VENTOLIN HFA) 108 (90 Base) MCG/ACT inhaler Inhale 1-2 puffs into the lungs every 6 (six) hours as needed for wheezing or shortness of breath. 06/24/22  Yes Iloabachie, Chioma E, NP  amLODipine (NORVASC) 10 MG tablet Take 1 tablet (10 mg total) by mouth once daily. 09/10/22  Yes Cannady, Jolene T, NP  atorvastatin (LIPITOR) 80 MG tablet Take 1 tablet (80 mg total) by mouth once daily. 09/10/22  Yes Cannady, Corrie Dandy T, NP  clopidogrel (PLAVIX) 75 MG tablet Take 1 tablet (75 mg total) by mouth daily. 09/10/22  Yes Cannady, Jolene T, NP  folic acid (FOLVITE) 1 MG tablet Take 1 tablet (1 mg total) by mouth daily. 07/17/22  Yes Iloabachie, Chioma E, NP  metoprolol succinate (TOPROL-XL) 50 MG 24 hr tablet Take 1 tablet (50 mg total) by mouth daily. Take with or immediately following a meal. 09/10/22  Yes Cannady, Jolene T, NP  Multiple Vitamin (MULTIVITAMIN WITH MINERALS) TABS tablet Take 1 tablet by mouth daily. 05/27/22  Yes Setzer, Lynnell Jude, PA-C  pantoprazole (PROTONIX) 40 MG tablet Take 1 tablet (40 mg total) by mouth at bedtime. 09/10/22  Yes Cannady, Jolene T, NP  thiamine (VITAMIN B-1) 100 MG tablet Take 1 tablet (100 mg total) by mouth daily. 09/10/22  Yes Cannady, Jolene T, NP  amoxicillin-clavulanate (AUGMENTIN) 875-125 MG tablet Take 1 tablet by mouth 2 (two) times daily for 14 days. 10/27/22 11/10/22  Henrene Dodge, MD    Allergies: No Known Allergies  Review of Systems: Review of Systems  Constitutional:  Negative for chills and fever.  Respiratory:  Negative for shortness of breath.   Cardiovascular:  Negative for chest pain.  Gastrointestinal:  Negative for abdominal pain, nausea and vomiting.  Skin:        Hidradenitis with different areas of drainage in the left buttocks, left groin, and new area in the  right buttocks.  Neurological:  Negative for weakness.  Psychiatric/Behavioral:  Negative for depression.     Physical Exam BP 117/74   Pulse 65   Temp 98.1 F (36.7 C) (Oral)   Ht 6\' 2"  (1.88 m)   Wt 259 lb (117.5 kg)   SpO2 96%   BMI 33.25 kg/m  CONSTITUTIONAL: No acute distress, well-nourished HEENT:  Normocephalic, atraumatic, extraocular motion intact. RESPIRATORY:  Lungs are clear, and breath sounds are equal bilaterally. Normal respiratory effort without pathologic use of accessory muscles. CARDIOVASCULAR: Heart is regular without murmurs, gallops, or rubs. SKIN: The patient has stable areas of the left buttocks and left groin with intermittent drainage with manual pressure.  There is a new area in the right buttocks that is about 5 cm in size with fluctuance.  No significant erythema and no drainage at this point. NEUROLOGIC:  Motor and sensation is grossly normal.  Cranial nerves are grossly intact. PSYCH:  Alert and oriented to person, place and time. Affect is normal.  Labs/Imaging: Labs from 10//23: Sodium 140, potassium 4.2, chloride 103, CO2 21, BUN 15, creatinine 1.27.  Total bilirubin 0.3, AST 7, ALT 6, alkaline phosphatase 87, albumin 4.3.  WBC 7.3, hemoglobin 12.7, hematocrit 37.8, platelets 392.  Globin A1c 5.7.  Assessment and Plan: This is a 56 y.o. male with hidradenitis of now bilateral buttocks, and left groin.  - Discussed with the patient that now that neurology may be able to clear him to come off Plavix for a more formal I&D or excision of hidradenitis areas, we could potentially schedule him for surgery.  Discussed with him that if cleared by neurology, we will be able to do incision and drainage procedures of the bilateral buttocks and excision procedures likely of the left groin area.  Discussed with him that afterwards, he would just need gauze dressing changes, possible packing of wounds.  He understands that and he wants to proceed with  surgery. --Reviewed with him then the plan for I&D and possible excision of bilateral buttocks and left groin hidradenitis.  Reviewed the surgery at length with him including the possible incisions, possible drains, risks of bleeding, infection, injury to surrounding structures, post-op pain control, dressing changes, and he's willing to proceed. --Will schedule him for surgery on 11/13/22.  Will send clearance form to Neurology.  Last dose of Plavix on 11/07/22.  I spent 30 minutes dedicated to the care of this patient on the date of this encounter to include pre-visit review of records, face-to-face time with the patient discussing diagnosis and management, and any post-visit coordination of care.   14/1/23, MD [facility]

## 2022-10-27 NOTE — H&P (View-Only) (Signed)
10/27/2022  History of Present Illness: James Lambert is a 56 y.o. male presenting for follow up of hidradenitis.  Patient has a long history of hidradenitis and was seen initially in the hospital in July for hidradenitis of the left buttocks, left groin and perineum.  He had just had a recent stroke which required thrombectomy in June 2023 and he was on aspirin and Plavix.  In light of that, no formal I&D was done and over the last few months, he was treated with antibiotics he did have a small I&D procedure in the office on 07/28/2022.  However despite of that, he continues to have draining issues.  He is still draining over the left buttocks, left groin, and reports today that he has a new area in the right buttocks that just recently developed.  Has not had drainage from there but is tender.  Of note, he recently saw his neurologist on 10/20/2022, and per their note, it may be that he could come off Plavix for surgery.  Past Medical History: Past Medical History:  Diagnosis Date   Asthma    COPD (chronic obstructive pulmonary disease) (HCC)    Hidradenitis suppurativa    diagnosed in [facility] ED based on history and physical exam   Hypertension      Past Surgical History: Past Surgical History:  Procedure Laterality Date   IR CT HEAD LTD  05/10/2022   IR FLUORO GUIDED NEEDLE PLC ASPIRATION/INJECTION LOC  03/20/2022   IR PERCUTANEOUS ART THROMBECTOMY/INFUSION INTRACRANIAL INC DIAG ANGIO  05/10/2022   IR US GUIDE VASC ACCESS RIGHT  05/10/2022   RADIOLOGY WITH ANESTHESIA N/A 05/10/2022   Procedure: IR WITH ANESTHESIA;  Surgeon: Julieanne Cotton, MD;  Location: [facility] OR;  Service: Radiology;  Laterality: N/A;   TEE WITHOUT CARDIOVERSION N/A 03/11/2022   Procedure: TRANSESOPHAGEAL ECHOCARDIOGRAM (TEE);  Surgeon: Lamar Blinks, MD;  Location: [facility] ORS;  Service: Cardiovascular;  Laterality: N/A;    Home Medications: Prior to Admission medications   Medication Sig Start Date End Date Taking?  Authorizing Provider  acetaminophen (TYLENOL) 325 MG tablet Take 2 tablets (650 mg total) by mouth every 6 (six) hours as needed for mild pain. 09/10/22  Yes Cannady, Jolene T, NP  albuterol (VENTOLIN HFA) 108 (90 Base) MCG/ACT inhaler Inhale 1-2 puffs into the lungs every 6 (six) hours as needed for wheezing or shortness of breath. 06/24/22  Yes Iloabachie, Chioma E, NP  amLODipine (NORVASC) 10 MG tablet Take 1 tablet (10 mg total) by mouth once daily. 09/10/22  Yes Cannady, Jolene T, NP  atorvastatin (LIPITOR) 80 MG tablet Take 1 tablet (80 mg total) by mouth once daily. 09/10/22  Yes Cannady, Corrie Dandy T, NP  clopidogrel (PLAVIX) 75 MG tablet Take 1 tablet (75 mg total) by mouth daily. 09/10/22  Yes Cannady, Jolene T, NP  folic acid (FOLVITE) 1 MG tablet Take 1 tablet (1 mg total) by mouth daily. 07/17/22  Yes Iloabachie, Chioma E, NP  metoprolol succinate (TOPROL-XL) 50 MG 24 hr tablet Take 1 tablet (50 mg total) by mouth daily. Take with or immediately following a meal. 09/10/22  Yes Cannady, Jolene T, NP  Multiple Vitamin (MULTIVITAMIN WITH MINERALS) TABS tablet Take 1 tablet by mouth daily. 05/27/22  Yes Setzer, Lynnell Jude, PA-C  pantoprazole (PROTONIX) 40 MG tablet Take 1 tablet (40 mg total) by mouth at bedtime. 09/10/22  Yes Cannady, Jolene T, NP  thiamine (VITAMIN B-1) 100 MG tablet Take 1 tablet (100 mg total) by mouth daily. 09/10/22  Yes Cannady, Jolene T, NP  amoxicillin-clavulanate (AUGMENTIN) 875-125 MG tablet Take 1 tablet by mouth 2 (two) times daily for 14 days. 10/27/22 11/10/22  Henrene Dodge, MD    Allergies: No Known Allergies  Review of Systems: Review of Systems  Constitutional:  Negative for chills and fever.  Respiratory:  Negative for shortness of breath.   Cardiovascular:  Negative for chest pain.  Gastrointestinal:  Negative for abdominal pain, nausea and vomiting.  Skin:        Hidradenitis with different areas of drainage in the left buttocks, left groin, and new area in the  right buttocks.  Neurological:  Negative for weakness.  Psychiatric/Behavioral:  Negative for depression.     Physical Exam BP 117/74   Pulse 65   Temp 98.1 F (36.7 C) (Oral)   Ht 6\' 2"  (1.88 m)   Wt 259 lb (117.5 kg)   SpO2 96%   BMI 33.25 kg/m  CONSTITUTIONAL: No acute distress, well-nourished HEENT:  Normocephalic, atraumatic, extraocular motion intact. RESPIRATORY:  Lungs are clear, and breath sounds are equal bilaterally. Normal respiratory effort without pathologic use of accessory muscles. CARDIOVASCULAR: Heart is regular without murmurs, gallops, or rubs. SKIN: The patient has stable areas of the left buttocks and left groin with intermittent drainage with manual pressure.  There is a new area in the right buttocks that is about 5 cm in size with fluctuance.  No significant erythema and no drainage at this point. NEUROLOGIC:  Motor and sensation is grossly normal.  Cranial nerves are grossly intact. PSYCH:  Alert and oriented to person, place and time. Affect is normal.  Labs/Imaging: Labs from 10//23: Sodium 140, potassium 4.2, chloride 103, CO2 21, BUN 15, creatinine 1.27.  Total bilirubin 0.3, AST 7, ALT 6, alkaline phosphatase 87, albumin 4.3.  WBC 7.3, hemoglobin 12.7, hematocrit 37.8, platelets 392.  Globin A1c 5.7.  Assessment and Plan: This is a 56 y.o. male with hidradenitis of now bilateral buttocks, and left groin.  - Discussed with the patient that now that neurology may be able to clear him to come off Plavix for a more formal I&D or excision of hidradenitis areas, we could potentially schedule him for surgery.  Discussed with him that if cleared by neurology, we will be able to do incision and drainage procedures of the bilateral buttocks and excision procedures likely of the left groin area.  Discussed with him that afterwards, he would just need gauze dressing changes, possible packing of wounds.  He understands that and he wants to proceed with  surgery. --Reviewed with him then the plan for I&D and possible excision of bilateral buttocks and left groin hidradenitis.  Reviewed the surgery at length with him including the possible incisions, possible drains, risks of bleeding, infection, injury to surrounding structures, post-op pain control, dressing changes, and he's willing to proceed. --Will schedule him for surgery on 11/13/22.  Will send clearance form to Neurology.  Last dose of Plavix on 11/07/22.  I spent 30 minutes dedicated to the care of this patient on the date of this encounter to include pre-visit review of records, face-to-face time with the patient discussing diagnosis and management, and any post-visit coordination of care.   14/1/23, MD [facility]

## 2022-10-27 NOTE — Patient Instructions (Addendum)
Last dose Plavix 10/31/22.    Please pick up your medication at the pharmacy.    Our surgery scheduler will call you within 24-48 hours to schedule your surgery. Please have the [facility] surgery sheet available when speaking with her.    We have sent Neurology Clearance to Dr.Sethi. Please call his office to see if you need to schedule an office visit with him prior to the Clearance being granted.   Hidradenitis Suppurativa Hidradenitis suppurativa is a long-term (chronic) skin disease. It is similar to a severe form of acne, but it affects areas of the body where acne would be unusual, especially areas of the body where skin rubs against skin and becomes moist. These include: Underarms. Groin. Genital area. Buttocks. Upper thighs. Breasts. Hidradenitis suppurativa may start out as small lumps or pimples caused by blocked skin pores, sweat glands, or hair follicles. Pimples may develop into deep sores that break open (rupture) and drain pus. Over time, affected areas of skin may thicken and become scarred. This condition is rare and does not spread from person to person (non-contagious). What are the causes? The exact cause of this condition is not known. It may be related to: Male and male hormones. An overactive disease-fighting system (immune system). The immune system may over-react to blocked hair follicles or sweat glands and cause swelling and pus-filled sores. What increases the risk? You are more likely to develop this condition if you: Are male. Are 64-53 years old. Have a family history of hidradenitis suppurativa. Have a personal history of acne. Are overweight. Smoke. Take the medicine lithium. What are the signs or symptoms? The first symptoms are usually painful bumps in the skin, similar to pimples. The condition may get worse over time (progress), or it may only cause mild symptoms. If the disease progresses, symptoms may include: Skin bumps getting bigger and  growing deeper into the skin. Bumps rupturing and draining pus. Itchy, infected skin. Skin getting thicker and scarred. Tunnels under the skin (fistulas) where pus drains from a bump. Pain during daily activities, such as pain during walking if your groin area is affected. Emotional problems, such as stress or depression. This condition may affect your appearance and your ability or willingness to wear certain clothes or do certain activities. How is this diagnosed? This condition is diagnosed by a health care provider who specializes in skin conditions (dermatologist). You may be diagnosed based on: Your symptoms and medical history. A physical exam. Testing a pus sample for infection. Blood tests. How is this treated? Your treatment will depend on how severe your symptoms are. The same treatment will not work for everybody with this condition. You may need to try several treatments to find what works best for you. Treatment may include: Cleaning and bandaging (dressing) your wounds as needed. Lifestyle changes, such as new skin care routines. Taking medicines, such as: Antibiotics. Acne medicines. Medicines to reduce the activity of the immune system. A diabetes medicine (metformin). Birth control pills, for women. Steroids to reduce swelling and pain. Working with a mental health care provider, if you experience emotional distress due to this condition. If you have severe symptoms that do not get better with medicine, you may need surgery. Surgery may involve: Using a laser to clear the skin and remove hair follicles. Opening and draining deep sores. Removing the areas of skin that are diseased and scarred. Follow these instructions at home: Medicines  Take over-the-counter and prescription medicines only as told by your health care  provider. If you were prescribed antibiotics, take them as told by your health care provider. Do not stop using the antibiotic even if your condition  improves. Skin care If you have open wounds, cover them with a clean dressing as told by your health care provider. Keep wounds clean by washing them gently with soap and water when you bathe. Do not shave the areas where you get hidradenitis suppurativa. Wear loose-fitting clothes. Try to avoid getting overheated or sweaty. If you get sweaty or wet, change into clean, dry clothes as soon as you can. To help relieve pain and itchiness, cover sore areas with a warm, clean washcloth (warm compress) for 5-10 minutes as often as needed. Your healthcare provider may recommend an antiperspirant deodorant that may be gentle on your skin. A daily antiseptic wash to cleanse affected areas may be suggested by your healthcare provider. General instructions Learn as much as you can about your disease so that you have an active role in your treatment. Work closely with your health care provider to find treatments that work for you. If you are overweight, work with your health care provider to lose weight as recommended. Do not use any products that contain nicotine or tobacco. These products include cigarettes, chewing tobacco, and vaping devices, such as e-cigarettes. If you need help quitting, ask your health care provider. If you struggle with living with this condition, talk with your health care provider or work with a mental health care provider as recommended. Keep all follow-up visits. Where to find more information Hidradenitis Suppurativa Foundation, Inc.: www.hs-foundation.org American Academy of Dermatology: InfoExam.si Contact a health care provider if: You have a flare-up of hidradenitis suppurativa. You have a fever or chills. You have trouble controlling your symptoms at home. You have trouble doing your daily activities because of your symptoms. You have trouble dealing with emotional problems related to your condition. Summary Hidradenitis suppurativa is a long-term (chronic) skin  disease. It is similar to a severe form of acne, but it affects areas of the body where acne would be unusual. The first symptoms are usually painful bumps in the skin, similar to pimples. The condition may only cause mild symptoms, or it may get worse over time (progress). If you have open wounds, cover them with a clean dressing as told by your health care provider. Keep wounds clean by washing them gently with soap and water when you bathe. Besides skin care, treatment may include medicines, laser treatment, and surgery. This information is not intended to replace advice given to you by your health care provider. Make sure you discuss any questions you have with your health care provider. Document Revised: 01/15/2022 Document Reviewed: 01/15/2022 Elsevier Patient Education  2023 [employer].

## 2022-10-27 NOTE — Telephone Encounter (Signed)
Neurology Clearance faxed to Dr.Pramod Sethi.

## 2022-10-28 ENCOUNTER — Telehealth: Payer: Self-pay | Admitting: Surgery

## 2022-10-28 NOTE — Telephone Encounter (Signed)
Patient has been advised of Pre-Admission date/time, and Surgery date at [facility].  Surgery Date: 11/13/22 Preadmission Testing Date: 11/04/22 (phone 8a-1p)  Patient has been made aware to call [phone], between 1-3:00pm the day before surgery, to find out what time to arrive for surgery.   Patient also reminded that last dose of Plavix to be 11/07/22 per Dr. Aleen Campi.  Patient verbalized understanding.

## 2022-11-04 ENCOUNTER — Encounter
Admission: RE | Admit: 2022-11-04 | Discharge: 2022-11-04 | Disposition: A | Discharge status: Home / Self Care | Payer: Medicaid Other | Source: Ambulatory Visit | Attending: Surgery | Admitting: Surgery

## 2022-11-04 HISTORY — DX: Cannabis use, unspecified, uncomplicated: F12.90

## 2022-11-04 HISTORY — DX: Atherosclerosis of aorta: I70.0

## 2022-11-04 HISTORY — DX: Alcohol abuse, uncomplicated: F10.10

## 2022-11-04 HISTORY — DX: Tobacco use: Z72.0

## 2022-11-04 HISTORY — DX: Epilepsy, unspecified, not intractable, without status epilepticus: G40.909

## 2022-11-04 HISTORY — DX: Gastro-esophageal reflux disease without esophagitis: K21.9

## 2022-11-04 HISTORY — DX: Anemia, unspecified: D64.9

## 2022-11-04 HISTORY — DX: Hyperlipidemia, unspecified: E78.5

## 2022-11-04 HISTORY — DX: Sleep apnea, unspecified: G47.30

## 2022-11-04 HISTORY — DX: Hemiplegia and hemiparesis following cerebral infarction affecting right dominant side: I69.351

## 2022-11-04 NOTE — Patient Instructions (Signed)
Your procedure is scheduled on:11-13-22 Thursday Report to the Registration Desk on the 1st floor of the [facility].Then proceed to the 2nd floor Surgery Desk To find out your arrival time, please call [phone] between 1PM - 3PM on:11-12-22 Wednesday If your arrival time is 6:00 am, do not arrive prior to that time as the [facility] entrance doors do not open until 6:00 am.  REMEMBER: Instructions that are not followed completely may result in serious medical risk, up to and including death; or upon the discretion of your surgeon and anesthesiologist your surgery may need to be rescheduled.  Do not eat food after midnight the night before surgery.  No gum chewing, lozengers or hard candies.  You may however, drink CLEAR liquids up to 2 hours before you are scheduled to arrive for your surgery. Do not drink anything within 2 hours of your scheduled arrival time.  Clear liquids include: - water  - apple juice without pulp - gatorade (not RED colors) - black coffee or tea (Do NOT add milk or creamers to the coffee or tea) Do NOT drink anything that is not on this list.  TAKE THESE MEDICATIONS THE MORNING OF SURGERY WITH A SIP OF WATER: -amLODipine (NORVASC)  -atorvastatin (LIPITOR)  -metoprolol succinate (TOPROL-XL)  -pantoprazole (PROTONIX)   Stop your clopidogrel (PLAVIX) 5 days prior to surgery-Last dose will be on 11-07-22 as instructed by Dr Aleen Campi  Use your Albuterol Inhaler the day of surgery and bring your Albuterol Inhaler to the hospital  One week prior to surgery: Stop Anti-inflammatories (NSAIDS) such as Advil, Aleve, Ibuprofen, Motrin, Naproxen, Naprosyn and Aspirin based products such as Excedrin, Goodys Powder, BC Powder.You may however, continue to take Tylenol if needed for pain up until the day of surgery.  Stop ANY OVER THE COUNTER supplements/vitamins NOW (11-04-22) until after surgery (Vitamin B1, multivitamin and folic acid)  No Alcohol for 24 hours  before or after surgery.  No Smoking including e-cigarettes for 24 hours prior to surgery.  No chewable tobacco products for at least 6 hours prior to surgery.  No nicotine patches on the day of surgery.  Do not use any "recreational" drugs for at least a week prior to your surgery.  Please be advised that the combination of cocaine and anesthesia may have negative outcomes, up to and including death. If you test positive for cocaine, your surgery will be cancelled.  On the morning of surgery brush your teeth with toothpaste and water, you may rinse your mouth with mouthwash if you wish. Do not swallow any toothpaste or mouthwash.  Use CHG Soap as directed on instruction sheet.  Do not wear jewelry, make-up, hairpins, clips or nail polish.  Do not wear lotions, powders, or perfumes.   Do not shave body from the neck down 48 hours prior to surgery just in case you cut yourself which could leave a site for infection.  Also, freshly shaved skin may become irritated if using the CHG soap.  Contact lenses, hearing aids and dentures may not be worn into surgery.  Do not bring valuables to the hospital. [facility] is not responsible for any missing/lost belongings or valuables.  Notify your doctor if there is any change in your medical condition (cold, fever, infection).  Wear comfortable clothing (specific to your surgery type) to the hospital.  After surgery, you can help prevent lung complications by doing breathing exercises.  Take deep breaths and cough every 1-2 hours. Your doctor may order a device  called an Chiropodist to help you take deep breaths. When coughing or sneezing, hold a pillow firmly against your incision with both hands. This is called "splinting." Doing this helps protect your incision. It also decreases belly discomfort.  If you are being admitted to the hospital overnight, leave your suitcase in the car. After surgery it may be brought to your  room.  If you are being discharged the day of surgery, you will not be allowed to drive home. You will need a responsible adult (18 years or older) to drive you home and stay with you that night.   If you are taking public transportation, you will need to have a responsible adult (18 years or older) with you. Please confirm with your physician that it is acceptable to use public transportation.   Please call the [place] [facility] at [phone] if you have any questions about these instructions.  Surgery Visitation Policy:  Patients undergoing a surgery or procedure may have two family members or support persons with them as long as the person is not COVID-19 positive or experiencing its symptoms.   MASKING: Due to an increase in RSV rates and hospitalizations, starting Wednesday, Nov. 15, in patient care areas in which we serve newborns, infants and children, masks will be required for teammates and visitors.  Children ages 23 and under may not visit. This policy affects the following departments only:  [place] [facility] area Mother Baby Unit Newborn nursery/Special care nursery  Other areas: Masks continue to be strongly recommended for [place] teammates, visitors and patients in all other areas. Visitation is not restricted outside of the units listed above.

## 2022-11-12 MED ORDER — ORAL CARE MOUTH RINSE: 15.0000 mL | Freq: Once | OROMUCOSAL | Status: AC

## 2022-11-12 MED ORDER — ACETAMINOPHEN 500 MG PO TABS: 1000.0000 mg | ORAL_TABLET | ORAL | Status: DC

## 2022-11-12 MED ORDER — GABAPENTIN 300 MG PO CAPS: 300.0000 mg | ORAL_CAPSULE | ORAL | Status: AC

## 2022-11-12 MED ORDER — CHLORHEXIDINE GLUCONATE CLOTH 2 % EX PADS: 6.0000 | MEDICATED_PAD | Freq: Once | CUTANEOUS | Status: DC

## 2022-11-12 MED ORDER — CHLORHEXIDINE GLUCONATE 0.12 % MT SOLN: 15.0000 mL | Freq: Once | OROMUCOSAL | Status: AC

## 2022-11-12 MED ORDER — CEFAZOLIN SODIUM-DEXTROSE 2-4 GM/100ML-% IV SOLN
2.0000 g | INTRAVENOUS | Status: AC
  Administered 2022-11-13: 2 g via INTRAVENOUS

## 2022-11-12 MED ORDER — LACTATED RINGERS IV SOLN: INTRAVENOUS | Status: DC

## 2022-11-12 MED ORDER — CHLORHEXIDINE GLUCONATE CLOTH 2 % EX PADS
6.0000 | MEDICATED_PAD | Freq: Once | CUTANEOUS | Status: AC
  Administered 2022-11-13: 6 via TOPICAL

## 2022-11-12 MED ORDER — BUPIVACAINE LIPOSOME 1.3 % IJ SUSP: 20.0000 mL | Freq: Once | INTRAMUSCULAR | Status: DC

## 2022-11-13 ENCOUNTER — Other Ambulatory Visit: Payer: Self-pay

## 2022-11-13 ENCOUNTER — Ambulatory Visit: Payer: Medicaid Other | Admitting: Anesthesiology

## 2022-11-13 ENCOUNTER — Ambulatory Visit: Payer: Medicaid Other | Admitting: Urgent Care

## 2022-11-13 ENCOUNTER — Ambulatory Visit
Admission: RE | Admit: 2022-11-13 | Discharge: 2022-11-13 | Disposition: A | Discharge status: Home / Self Care | Payer: Medicaid Other | Attending: Surgery | Admitting: Surgery

## 2022-11-13 ENCOUNTER — Encounter: Payer: Self-pay | Admitting: Surgery

## 2022-11-13 ENCOUNTER — Encounter
Admission: RE | Disposition: A | Discharge status: Home / Self Care | Payer: Self-pay | Source: Home / Self Care | Attending: Surgery

## 2022-11-13 DIAGNOSIS — L02214 Cutaneous abscess of groin: Secondary | ICD-10-CM | POA: Insufficient documentation

## 2022-11-13 DIAGNOSIS — L02215 Cutaneous abscess of perineum: Secondary | ICD-10-CM | POA: Diagnosis not present

## 2022-11-13 DIAGNOSIS — L0231 Cutaneous abscess of buttock: Secondary | ICD-10-CM | POA: Insufficient documentation

## 2022-11-13 DIAGNOSIS — Z8673 Personal history of transient ischemic attack (TIA), and cerebral infarction without residual deficits: Secondary | ICD-10-CM | POA: Diagnosis not present

## 2022-11-13 DIAGNOSIS — L732 Hidradenitis suppurativa: Secondary | ICD-10-CM | POA: Diagnosis not present

## 2022-11-13 DIAGNOSIS — F17210 Nicotine dependence, cigarettes, uncomplicated: Secondary | ICD-10-CM | POA: Insufficient documentation

## 2022-11-13 DIAGNOSIS — I10 Essential (primary) hypertension: Secondary | ICD-10-CM | POA: Insufficient documentation

## 2022-11-13 DIAGNOSIS — Z7902 Long term (current) use of antithrombotics/antiplatelets: Secondary | ICD-10-CM | POA: Insufficient documentation

## 2022-11-13 HISTORY — PX: INCISION AND DRAINAGE ABSCESS: SHX5864

## 2022-11-13 SURGERY — INCISION AND DRAINAGE, ABSCESS
Anesthesia: General | Site: Buttocks | Laterality: Bilateral

## 2022-11-13 MED ORDER — ACETAMINOPHEN 10 MG/ML IV SOLN: 1000.0000 mg | Freq: Once | INTRAVENOUS | Status: DC | PRN

## 2022-11-13 MED ORDER — ACETAMINOPHEN 500 MG PO TABS
ORAL_TABLET | ORAL | Status: AC
  Filled 2022-11-13: qty 2

## 2022-11-13 MED ORDER — OXYCODONE HCL 5 MG PO TABS: 5.0000 mg | ORAL_TABLET | ORAL | 0 refills | Status: DC | PRN

## 2022-11-13 MED ORDER — BUPIVACAINE LIPOSOME 1.3 % IJ SUSP
INTRAMUSCULAR | Status: AC
  Filled 2022-11-13: qty 20

## 2022-11-13 MED ORDER — CEFAZOLIN SODIUM-DEXTROSE 2-4 GM/100ML-% IV SOLN
INTRAVENOUS | Status: AC
  Filled 2022-11-13: qty 100

## 2022-11-13 MED ORDER — SEVOFLURANE IN SOLN
RESPIRATORY_TRACT | Status: AC
  Filled 2022-11-13: qty 250

## 2022-11-13 MED ORDER — DEXAMETHASONE SODIUM PHOSPHATE 10 MG/ML IJ SOLN
INTRAMUSCULAR | Status: AC
  Filled 2022-11-13: qty 1

## 2022-11-13 MED ORDER — LIDOCAINE HCL (CARDIAC) PF 100 MG/5ML IV SOSY
PREFILLED_SYRINGE | INTRAVENOUS | Status: DC | PRN
  Administered 2022-11-13: 100 mg via INTRAVENOUS

## 2022-11-13 MED ORDER — BUPIVACAINE-EPINEPHRINE (PF) 0.5% -1:200000 IJ SOLN
INTRAMUSCULAR | Status: AC
  Filled 2022-11-13: qty 30

## 2022-11-13 MED ORDER — OXYCODONE HCL 5 MG/5ML PO SOLN: 5.0000 mg | Freq: Once | ORAL | Status: DC | PRN

## 2022-11-13 MED ORDER — ROCURONIUM BROMIDE 100 MG/10ML IV SOLN
INTRAVENOUS | Status: DC | PRN
  Administered 2022-11-13: 50 mg via INTRAVENOUS
  Administered 2022-11-13: 20 mg via INTRAVENOUS

## 2022-11-13 MED ORDER — PROPOFOL 10 MG/ML IV BOLUS
INTRAVENOUS | Status: AC
  Filled 2022-11-13: qty 40

## 2022-11-13 MED ORDER — BUPIVACAINE-EPINEPHRINE (PF) 0.5% -1:200000 IJ SOLN
INTRAMUSCULAR | Status: DC | PRN
  Administered 2022-11-13: 50 mL via INTRAMUSCULAR

## 2022-11-13 MED ORDER — 0.9 % SODIUM CHLORIDE (POUR BTL) OPTIME
TOPICAL | Status: DC | PRN
  Administered 2022-11-13: 500 mL

## 2022-11-13 MED ORDER — FENTANYL CITRATE (PF) 100 MCG/2ML IJ SOLN
INTRAMUSCULAR | Status: AC
  Filled 2022-11-13: qty 2

## 2022-11-13 MED ORDER — GABAPENTIN 300 MG PO CAPS
ORAL_CAPSULE | ORAL | Status: AC
  Administered 2022-11-13: 300 mg via ORAL
  Filled 2022-11-13: qty 1

## 2022-11-13 MED ORDER — SUGAMMADEX SODIUM 200 MG/2ML IV SOLN
INTRAVENOUS | Status: DC | PRN
  Administered 2022-11-13: 250 mg via INTRAVENOUS

## 2022-11-13 MED ORDER — MIDAZOLAM HCL 2 MG/2ML IJ SOLN
INTRAMUSCULAR | Status: DC | PRN
  Administered 2022-11-13 (×2): 1 mg via INTRAVENOUS

## 2022-11-13 MED ORDER — LACTATED RINGERS IV SOLN: INTRAVENOUS | Status: DC

## 2022-11-13 MED ORDER — OXYCODONE HCL 5 MG PO TABS: 5.0000 mg | ORAL_TABLET | Freq: Once | ORAL | Status: DC | PRN

## 2022-11-13 MED ORDER — ONDANSETRON HCL 4 MG/2ML IJ SOLN: 4.0000 mg | Freq: Once | INTRAMUSCULAR | Status: DC | PRN

## 2022-11-13 MED ORDER — HYDRALAZINE HCL 20 MG/ML IJ SOLN
INTRAMUSCULAR | Status: AC
  Administered 2022-11-13: 5 mg via INTRAVENOUS
  Filled 2022-11-13: qty 1

## 2022-11-13 MED ORDER — DEXAMETHASONE SODIUM PHOSPHATE 10 MG/ML IJ SOLN
INTRAMUSCULAR | Status: DC | PRN
  Administered 2022-11-13: 10 mg via INTRAVENOUS

## 2022-11-13 MED ORDER — PROPOFOL 10 MG/ML IV BOLUS
INTRAVENOUS | Status: DC | PRN
  Administered 2022-11-13 (×3): 50 mg via INTRAVENOUS
  Administered 2022-11-13: 40 mg via INTRAVENOUS
  Administered 2022-11-13: 50 mg via INTRAVENOUS
  Administered 2022-11-13: 150 mg via INTRAVENOUS

## 2022-11-13 MED ORDER — FENTANYL CITRATE (PF) 100 MCG/2ML IJ SOLN
INTRAMUSCULAR | Status: DC | PRN
  Administered 2022-11-13 (×4): 50 ug via INTRAVENOUS

## 2022-11-13 MED ORDER — HYDRALAZINE HCL 20 MG/ML IJ SOLN: 5.0000 mg | Freq: Once | INTRAMUSCULAR | Status: AC

## 2022-11-13 MED ORDER — ACETAMINOPHEN 500 MG PO TABS: 1000.0000 mg | ORAL_TABLET | Freq: Four times a day (QID) | ORAL | Status: DC | PRN

## 2022-11-13 MED ORDER — ONDANSETRON HCL 4 MG/2ML IJ SOLN
INTRAMUSCULAR | Status: DC | PRN
  Administered 2022-11-13: 4 mg via INTRAVENOUS

## 2022-11-13 MED ORDER — CHLORHEXIDINE GLUCONATE 0.12 % MT SOLN
OROMUCOSAL | Status: AC
  Administered 2022-11-13: 15 mL via OROMUCOSAL
  Filled 2022-11-13: qty 15

## 2022-11-13 MED ORDER — ALBUTEROL SULFATE HFA 108 (90 BASE) MCG/ACT IN AERS
INHALATION_SPRAY | RESPIRATORY_TRACT | Status: DC | PRN
  Administered 2022-11-13 (×2): 6 via RESPIRATORY_TRACT

## 2022-11-13 MED ORDER — EPHEDRINE SULFATE (PRESSORS) 50 MG/ML IJ SOLN
INTRAMUSCULAR | Status: DC | PRN
  Administered 2022-11-13 (×2): 5 mg via INTRAVENOUS
  Administered 2022-11-13: 10 mg via INTRAVENOUS
  Administered 2022-11-13 (×3): 5 mg via INTRAVENOUS

## 2022-11-13 MED ORDER — MIDAZOLAM HCL 2 MG/2ML IJ SOLN
INTRAMUSCULAR | Status: AC
  Filled 2022-11-13: qty 2

## 2022-11-13 MED ORDER — DEXMEDETOMIDINE HCL IN NACL 80 MCG/20ML IV SOLN
INTRAVENOUS | Status: AC
  Filled 2022-11-13: qty 20

## 2022-11-13 MED ORDER — HYDRALAZINE HCL 20 MG/ML IJ SOLN
5.0000 mg | Freq: Once | INTRAMUSCULAR | Status: AC
  Administered 2022-11-13: 5 mg via INTRAVENOUS
  Filled 2022-11-13: qty 0.25

## 2022-11-13 MED ORDER — FENTANYL CITRATE (PF) 100 MCG/2ML IJ SOLN: 25.0000 ug | INTRAMUSCULAR | Status: DC | PRN

## 2022-11-13 SURGICAL SUPPLY — 36 items
BLADE SURG 15 STRL LF DISP TIS (BLADE) ×1 IMPLANT
BLADE SURG 15 STRL SS (BLADE) ×1
CHLORAPREP W/TINT 26 (MISCELLANEOUS) ×1 IMPLANT
DERMABOND ADVANCED .7 DNX12 (GAUZE/BANDAGES/DRESSINGS) ×1 IMPLANT
DRAIN PENROSE 12X.25 LTX STRL (MISCELLANEOUS) IMPLANT
DRAPE LAPAROTOMY TRNSV 106X77 (MISCELLANEOUS) ×1 IMPLANT
ELECT CAUTERY BLADE TIP 2.5 (TIP) ×1
ELECTRODE CAUTERY BLDE TIP 2.5 (TIP) ×1 IMPLANT
GAUZE 4X4 16PLY ~~LOC~~+RFID DBL (SPONGE) ×1 IMPLANT
GAUZE PACKING IODOFORM 1/2INX (GAUZE/BANDAGES/DRESSINGS) ×1 IMPLANT
GAUZE SPONGE 4X4 12PLY STRL (GAUZE/BANDAGES/DRESSINGS) ×1 IMPLANT
GLOVE SURG SYN 7.0 (GLOVE) ×1 IMPLANT
GLOVE SURG SYN 7.0 PF PI (GLOVE) ×1 IMPLANT
GLOVE SURG SYN 7.5  E (GLOVE) ×1
GLOVE SURG SYN 7.5 E (GLOVE) ×1 IMPLANT
GLOVE SURG SYN 7.5 PF PI (GLOVE) ×1 IMPLANT
GOWN STRL REUS W/ TWL LRG LVL3 (GOWN DISPOSABLE) ×2 IMPLANT
GOWN STRL REUS W/TWL LRG LVL3 (GOWN DISPOSABLE) ×2
LABEL OR SOLS (LABEL) ×1 IMPLANT
MANIFOLD NEPTUNE II (INSTRUMENTS) ×1 IMPLANT
NEEDLE HYPO 22GX1.5 SAFETY (NEEDLE) ×1 IMPLANT
NS IRRIG 500ML POUR BTL (IV SOLUTION) ×1 IMPLANT
PACK BASIN MINOR ARMC (MISCELLANEOUS) ×1 IMPLANT
PAD ABD DERMACEA PRESS 5X9 (GAUZE/BANDAGES/DRESSINGS) IMPLANT
SPONGE T-LAP 18X18 ~~LOC~~+RFID (SPONGE) ×1 IMPLANT
SUT MNCRL 4-0 (SUTURE)
SUT MNCRL 4-0 27XMFL (SUTURE)
SUT SILK 0 (SUTURE) ×1
SUT SILK 0 30XBRD TIE 6 (SUTURE) IMPLANT
SUT VIC AB 2-0 CT2 27 (SUTURE) ×2 IMPLANT
SUTURE MNCRL 4-0 27XMF (SUTURE) ×1 IMPLANT
SWAB CULTURE AMIES ANAERIB BLU (MISCELLANEOUS) IMPLANT
SYR 10ML LL (SYRINGE) ×2 IMPLANT
TAPE CLOTH SURG 4X10 WHT LF (GAUZE/BANDAGES/DRESSINGS) IMPLANT
TRAP FLUID SMOKE EVACUATOR (MISCELLANEOUS) ×1 IMPLANT
WATER STERILE IRR 500ML POUR (IV SOLUTION) ×1 IMPLANT

## 2022-11-13 NOTE — Discharge Instructions (Signed)

## 2022-11-13 NOTE — Op Note (Addendum)
Procedure Date:  11/13/2022  Pre-operative Diagnosis:  Hidradenitis with abscess of bilateral buttocks and left groin/perineum  Post-operative Diagnosis: Hidradenitis with abscess of bilateral buttocks and left groin/perineum  Procedure:  Incision and Drainage of Hidradenitis with abscess of bilateral buttocks and left groin/perineum, for a total of 5 different abscess cavities.  Surgeon:  Howie Ill, MD  Anesthesia:  General endotracheal  Estimated Blood Loss:  30 ml  Specimens:    Left buttocks abscess culture swab  Complications:  None  Indications for Procedure:  This is a 56 y.o. male with diagnosis of hidradenitis with abscess of bilateral buttocks and left groin/perineum.  He was initially seen in July for this, but given a recent stroke and being on Plavix, he's been managed conservatively with antibiotics during this time.  Now he's able to come off Plavix and he presents for more formal I&D of all these areas.  The risks of bleeding, abscess or infection, injury to surrounding structures, and need for further procedures were all discussed with the patient and was willing to proceed.  Description of Procedure: The patient was correctly identified in the preoperative area and brought into the operating room.  The patient was placed supine with VTE prophylaxis in place.  Appropriate time-outs were performed.  Anesthesia was induced and the patient was intubated.  Appropriate antibiotics were infused.  The patient was first placed in prone position.  Ultrasound was used at bedside to determine the location of the abscess cavities on either side, and the area was marked with marking pen.  The patient's bilateral buttocks were prepped and draped in usual sterile fashion.  Cautery was used to make an incision in the right buttocks at the superior portion of the abscess cavity.  Kelly forceps were then used to dissect the cavity and this reached inferiorly for about 10 cm.  A  counter incision was made at that point, and an additional incision halfway through.  Then a penrose drain was looped between the superior to middle and middle to inferior wounds.  These were tied together using 0 Silk ties.  We then proceeded to the left buttocks and similarly, an incision was made at the superior, middle, and inferior portion of the abscess cavity.  Some purulent fluid was obtained and swabbed for culture.  Similarly, a penrose drain was looped between superior to middle and middle to inferior wounds and tied together using 0 Silk ties.  The two areas were thoroughly irrigated.  30 ml of Exparel solution mixed with 0.5% bupivacaine and epi was infiltrated onto the incisions.  The area was cleaned and both sides were dressed with 4x4 gauze, ABD pads, and tape.  The patient was then placed in supine position and legs were placed in flexed position with knees out.  The left groin and perineum were prepped and draped in usual sterile fashion.  The patient had areas of active drainage.  We started with the anterior left groin and an incision was made.  Kelly forceps were used to dissect a cavity, and was noted to extend medially towards the perineum.  A counter incision was made there and a penrose drain was passed through and tied together using 0 silk ties.  The next area of drainage in the medial upper thigh/groin junction was incised and cavity tracked medially/posteriorly in the upper thigh and also to the perineum.  Counter incisions were made in both areas and two penrose drains were passed between incisions and tied together with 0 Silk  ties.  Finally, an area in the left perineum was also incised, and Kelly forceps were used to dissect the cavity.  This was tracking posteriorly and a counter incision was made in that location.  A penrose drain was passed through and tied together using 0 Silk ties.  All cavities were then irrigated and hemostasis achieved with cautery.  20 ml of Exparel  solution mixed with 0.5% bupivacaine and epi was infiltrated onto the incisions.  The area was cleaned and dressed with 4x4 gauze, ABD pads, and tape.  The patient was then emerged from anesthesia, extubated, and brought to the recovery room for further management.  The patient tolerated the procedure well and all counts were correct at the end of the case.   Howie Ill, MD

## 2022-11-13 NOTE — Anesthesia Postprocedure Evaluation (Signed)
Anesthesia Post Note  Patient: James Lambert  Procedure(s) Performed: INCISION AND DRAINAGE ABSCESS bilateral buttocks & left groin (Bilateral: Buttocks)  Patient location during evaluation: PACU Anesthesia Type: General Level of consciousness: awake and alert, oriented and patient cooperative Pain management: pain level controlled Vital Signs Assessment: post-procedure vital signs reviewed and stable Respiratory status: spontaneous breathing, nonlabored ventilation and respiratory function stable Cardiovascular status: blood pressure returned to baseline and stable Postop Assessment: adequate PO intake Anesthetic complications: no   No notable events documented.   Last Vitals:  Vitals:   11/13/22 1310 11/13/22 1315  BP: (!) 158/99   Pulse: 69 70  Resp: 14 16  Temp: (!) 36.4 C   SpO2: 93% 96%    Last Pain:  Vitals:   11/13/22 1315  TempSrc:   PainSc: 0-No pain                 Reed Breech

## 2022-11-13 NOTE — Interval H&P Note (Signed)
History and Physical Interval Note:  11/13/2022 9:19 AM  [name]  has presented today for surgery, with the diagnosis of hidradenitis.  The various methods of treatment have been discussed with the patient and family. After consideration of risks, benefits and other options for treatment, the patient has consented to  Procedure(s): INCISION AND DRAINAGE ABSCESS bilateral buttocks & left groin (Bilateral) as a surgical intervention.  The patient's history has been reviewed, patient examined, no change in status, stable for surgery.  I have reviewed the patient's chart and labs.  Questions were answered to the patient's satisfaction.     James Lambert

## 2022-11-13 NOTE — Transfer of Care (Signed)
Immediate Anesthesia Transfer of Care Note  Patient: James Lambert  Procedure(s) Performed: INCISION AND DRAINAGE ABSCESS bilateral buttocks & left groin (Bilateral: Buttocks)  Patient Location: PACU  Anesthesia Type:General  Level of Consciousness: awake, alert , and oriented  Airway & Oxygen Therapy: Patient connected to face mask oxygen  Post-op Assessment: Report given to RN  Post vital signs: Reviewed  Last Vitals:  Vitals Value Taken Time  BP 160/103 11/13/22 1249  Temp 36.3 C 11/13/22 1249  Pulse 76 11/13/22 1259  Resp 13 11/13/22 1259  SpO2 97 % 11/13/22 1259  Vitals shown include unvalidated device data.  Last Pain:  Vitals:   11/13/22 1249  TempSrc:   PainSc: 0-No pain         Complications: No notable events documented.

## 2022-11-13 NOTE — Anesthesia Preprocedure Evaluation (Signed)
Anesthesia Evaluation  Patient identified by MRN, date of birth, ID band Patient awake    Reviewed: Allergy & Precautions, NPO status , Patient's Chart, lab work & pertinent test results  History of Anesthesia Complications Negative for: history of anesthetic complications  Airway Mallampati: III   Neck ROM: Full    Dental  (+) Missing   Pulmonary asthma , sleep apnea , COPD, Current Smoker (1/2 ppd) and Patient abstained from smoking.   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, residual bilateral LE weakness and numbness; on Plavix)    GI/Hepatic negative GI ROS,,,  Endo/Other  negative endocrine ROS    Renal/GU negative Renal ROS     Musculoskeletal   Abdominal   Peds  Hematology negative hematology ROS (+)   Anesthesia Other Findings   Reproductive/Obstetrics                             Anesthesia Physical Anesthesia Plan  ASA: 2  Anesthesia Plan: General   Post-op Pain Management:    Induction: Intravenous  PONV Risk Score and Plan: 1 and Ondansetron, Dexamethasone and Treatment may vary due to age or medical condition  Airway Management Planned: Oral ETT  Additional Equipment:   Intra-op Plan:   Post-operative Plan: Extubation in OR  Informed Consent: I have reviewed the patients History and Physical, chart, labs and discussed the procedure including the risks, benefits and alternatives for the proposed anesthesia with the patient or authorized representative who has indicated his/her understanding and acceptance.     Dental advisory given  Plan Discussed with: CRNA  Anesthesia Plan Comments: (Patient consented for risks of anesthesia including but not  limited to:  - adverse reactions to medications - damage to eyes, teeth, lips or other oral mucosa - nerve damage due to positioning  - sore throat or hoarseness - damage to heart, brain, nerves, lungs, other parts of body or loss of life  Informed patient about role of CRNA in peri- and intra-operative care.  Patient voiced understanding.)       Anesthesia Quick Evaluation

## 2022-11-13 NOTE — Anesthesia Procedure Notes (Signed)
Procedure Name: Intubation Date/Time: 11/13/2022 10:12 AM  Performed by: Gigi Gin, CRNAPre-anesthesia Checklist: Patient identified, Emergency Drugs available, Suction available, Patient being monitored and Timeout performed Patient Re-evaluated:Patient Re-evaluated prior to induction Oxygen Delivery Method: Circle system utilized Preoxygenation: Pre-oxygenation with 100% oxygen Induction Type: IV induction Ventilation: Mask ventilation without difficulty Laryngoscope Size: Mac and 4 Grade View: Grade II Laser Tube: Cuffed inflated with minimal occlusive pressure - saline Tube size: 7.5 mm Number of attempts: 2 Airway Equipment and Method: Stylet Placement Confirmation: ETT inserted through vocal cords under direct vision Secured at: 23 cm Tube secured with: Tape Dental Injury: Teeth and Oropharynx as per pre-operative assessment  Comments: McGrath 4 blade used initially with no space to get ETT through cords.  Hand bagged between and placed with MAC 4 regular blade.

## 2022-11-14 ENCOUNTER — Encounter: Payer: Self-pay | Admitting: Surgery

## 2022-11-16 NOTE — Patient Instructions (Incomplete)

## 2022-11-17 LAB — AEROBIC/ANAEROBIC CULTURE W GRAM STAIN (SURGICAL/DEEP WOUND)

## 2022-11-17 NOTE — Progress Notes (Unsigned)
Received Neurology Clearance from Dr Pearlean Brownie. The patient is cleared at Low risk for surgery. He may hold his Plavix for 3-5 days prior to surgery.

## 2022-11-19 ENCOUNTER — Ambulatory Visit: Payer: Medicaid Other | Admitting: Nurse Practitioner

## 2022-11-19 DIAGNOSIS — G40909 Epilepsy, unspecified, not intractable, without status epilepticus: Secondary | ICD-10-CM

## 2022-11-19 DIAGNOSIS — Z8673 Personal history of transient ischemic attack (TIA), and cerebral infarction without residual deficits: Secondary | ICD-10-CM

## 2022-11-19 DIAGNOSIS — L732 Hidradenitis suppurativa: Secondary | ICD-10-CM

## 2022-11-19 DIAGNOSIS — I69351 Hemiplegia and hemiparesis following cerebral infarction affecting right dominant side: Secondary | ICD-10-CM

## 2022-11-19 DIAGNOSIS — F1010 Alcohol abuse, uncomplicated: Secondary | ICD-10-CM

## 2022-11-19 DIAGNOSIS — I10 Essential (primary) hypertension: Secondary | ICD-10-CM

## 2022-11-19 DIAGNOSIS — Z1159 Encounter for screening for other viral diseases: Secondary | ICD-10-CM

## 2022-11-19 DIAGNOSIS — Z23 Encounter for immunization: Secondary | ICD-10-CM

## 2022-11-19 DIAGNOSIS — I700 Atherosclerosis of aorta: Secondary | ICD-10-CM

## 2022-11-19 DIAGNOSIS — R7309 Other abnormal glucose: Secondary | ICD-10-CM

## 2022-11-19 DIAGNOSIS — F17210 Nicotine dependence, cigarettes, uncomplicated: Secondary | ICD-10-CM

## 2022-11-19 DIAGNOSIS — J449 Chronic obstructive pulmonary disease, unspecified: Secondary | ICD-10-CM

## 2022-11-19 DIAGNOSIS — E782 Mixed hyperlipidemia: Secondary | ICD-10-CM

## 2022-11-21 ENCOUNTER — Ambulatory Visit (INDEPENDENT_AMBULATORY_CARE_PROVIDER_SITE_OTHER): Payer: Medicaid Other | Admitting: Surgery

## 2022-11-21 ENCOUNTER — Encounter: Payer: Self-pay | Admitting: Surgery

## 2022-11-21 VITALS — BP 138/95 | HR 63 | Temp 98.0°F | Ht 74.0 in | Wt 260.0 lb

## 2022-11-21 DIAGNOSIS — L732 Hidradenitis suppurativa: Secondary | ICD-10-CM

## 2022-11-21 MED ORDER — OXYCODONE HCL 5 MG PO TABS: 5.0000 mg | ORAL_TABLET | ORAL | 0 refills | Status: DC | PRN

## 2022-11-21 NOTE — Progress Notes (Signed)
11/21/2022  HPI: James Lambert is a 56 y.o. male s/p I&D of hidradenitis of bilateral buttocks and left inner groin/perineum area on 11/13/22.  He presents for follow up.  He has multiple penrose drains placed given the size of the abscess cavities.  He reports soreness over the incisions and drainage of serosanguinous fluid.  Denies any new areas of drainage.  Vital signs: BP (!) 138/95   Pulse 63   Temp 98 F (36.7 C)   Ht 6\' 2"  (1.88 m)   Wt 260 lb (117.9 kg)   SpO2 99%   BMI 33.38 kg/m    Physical Exam: Constitutional:  No acute distress Skin:  I&D wounds are healing well, with drains in place.  One drain removed from each buttocks area and one removed from the inner groin/perineum area.    Assessment/Plan: This is a 56 y.o. male s/p I&D of hidradenitis abscesses of bilateral buttocks and left inner groin/perineum.  --Three drains removed overall today.  No complications.  Continue dry gauze or pad dressing changes.  The wounds are healing appropriately. --Follow up 1 week with James Lambert to remove buttocks drains and another perineal/groin drain.   Manus Rudd, MD [facility]

## 2022-11-21 NOTE — Patient Instructions (Signed)
Continue to keep a dressing over the areas. You may shower, letting the warm soapy water run over the areas, rinse well and pat dry and redress.  Follow up here in 1 week.  Call us if you develop a fever over 100, spreading redness, or increase in pain in the area.

## 2022-11-27 ENCOUNTER — Ambulatory Visit (INDEPENDENT_AMBULATORY_CARE_PROVIDER_SITE_OTHER): Payer: Medicaid Other | Admitting: Physician Assistant

## 2022-11-27 ENCOUNTER — Other Ambulatory Visit: Payer: Self-pay

## 2022-11-27 ENCOUNTER — Encounter: Payer: Self-pay | Admitting: Physician Assistant

## 2022-11-27 VITALS — BP 165/91 | HR 76 | Temp 98.7°F | Ht 74.0 in | Wt 256.0 lb

## 2022-11-27 DIAGNOSIS — Z09 Encounter for follow-up examination after completed treatment for conditions other than malignant neoplasm: Secondary | ICD-10-CM

## 2022-11-27 DIAGNOSIS — L732 Hidradenitis suppurativa: Secondary | ICD-10-CM

## 2022-11-27 MED ORDER — OXYCODONE HCL 5 MG PO TABS: 5.0000 mg | ORAL_TABLET | ORAL | 0 refills | Status: DC | PRN

## 2022-11-27 NOTE — Progress Notes (Signed)
[facility] POST-OP OFFICE VISIT  11/27/2022  HPI: James Lambert is a 56 y.o. male 14 days s/p  I&D of hidradenitis of bilateral buttocks and left inner groin/perineum with Dr Aleen Campi   Pain continues to be a major barrier; he suspects this is secondary to the drains themselves He has not noticed any new areas of fluctuance or drainage No fever or chills He has 5 penrose drains remaining  Vital signs: BP (!) 165/91   Pulse 76   Temp 98.7 F (37.1 C) (Oral)   Ht 6\' 2"  (1.88 m)   Wt 256 lb (116.1 kg)   SpO2 98%   BMI 32.87 kg/m    Physical Exam: Constitutional: Well appearing male, NAD Skin: present as chaperone. To the bilateral buttock/gluteal crease there are numerous I&D sites present. The more superior ones are healing well. There ar two remaining penrose drains (one of the right, one of the left) in the more depend incisions sites; these were removed without issue. There is no further appreciable drainage nor fluctuance. These incisions are indurated expectedly. To the left groin/perineum, there remain 3 penrose drains. I removed the most superior one leaving the two more dependent drains present. Similar to the gluteal wounds, these are indurated but there is no appreciable fluctuance nor additional drainage.   Assessment/Plan: This is a 56 y.o. male 14 days s/p  I&D of hidradenitis of bilateral buttocks and left inner groin/perineum with Dr 53    - Remaining gluteal drains x2 removed. Most superior left groin/perineal drain removed as well. There remains two dependent penrose drains left in the left groin/perineum  - Pain control prn; I will refill his pain medications  - Reviewed wound care recommendation; continue superficial dressings daily + prn  - He will follow up with Dr Aleen Campi next week for remaining drain removal; He understands to call with questions/concerns in the interim   -- Aleen Campi, PA-C [facility] 11/27/2022, 3:14 PM M-F: 7am - 4pm

## 2022-11-27 NOTE — Addendum Note (Signed)
Addended by: Lynden Oxford R on: 11/27/2022 03:27 PM   Modules accepted: Orders

## 2022-12-05 ENCOUNTER — Encounter: Payer: Medicaid Other | Admitting: Surgery

## 2022-12-10 ENCOUNTER — Ambulatory Visit (INDEPENDENT_AMBULATORY_CARE_PROVIDER_SITE_OTHER): Payer: Medicaid Other | Admitting: Surgery

## 2022-12-10 ENCOUNTER — Encounter: Payer: Self-pay | Admitting: Surgery

## 2022-12-10 VITALS — BP 159/96 | HR 67 | Temp 98.7°F | Ht 74.0 in | Wt 252.0 lb

## 2022-12-10 DIAGNOSIS — L732 Hidradenitis suppurativa: Secondary | ICD-10-CM

## 2022-12-10 DIAGNOSIS — Z09 Encounter for follow-up examination after completed treatment for conditions other than malignant neoplasm: Secondary | ICD-10-CM | POA: Diagnosis not present

## 2022-12-10 MED ORDER — OXYCODONE HCL 5 MG PO TABS: 5.0000 mg | ORAL_TABLET | ORAL | 0 refills | Status: DC | PRN

## 2022-12-10 NOTE — Progress Notes (Signed)
12/10/2022  HPI: James Lambert is a 57 y.o. male s/p I&D of hidradenitis areas of bilateral buttocks and left groin/perineum on 11/13/2022.  He was last seen on 11/27/22 at which time more of his drains were removed.  2 more drains remain at this point.  He reports that he has been doing well and denies any worsening pain.  Still reports drainage from the open areas but the bilateral buttocks wounds have been healing appropriately.  Vital signs: BP (!) 159/96   Pulse 67   Temp 98.7 F (37.1 C)   Ht 6\' 2"  (1.88 m)   Wt 252 lb (114.3 kg)   SpO2 98%   BMI 32.35 kg/m    Physical Exam: Constitutional: No acute distress Skin: Bilateral buttocks wounds are closing appropriately and the area is soft without any significant firmness or drainage.  There are 2 more drains remaining in the groin/perineum which were removed today with no complications.  ABD pad was placed.  The other wounds in that area are healing appropriately still with some mild drainage.  Assessment/Plan: This is a 57 y.o. male s/p I&D of hidradenitis areas of bilateral buttocks and left groin/perineum.  - Last 2 Penrose drains were removed today without complications.  ABD pad was placed for dressing.  Discussed with the patient continue doing daily dressing changes.  This wound will start closing up and drainage will decrease and resolve. - Patient will follow-up with me in 2 weeks to reassess his wounds. - Recommended that he keep his appointment with dermatology to schedule for this month as they will be working on maintenance of his hidradenitis.   Melvyn Neth, [facility]

## 2022-12-10 NOTE — Patient Instructions (Signed)
Keep the area clean and dry. When you take a shower let the warm soapy water run over the areas, rinse very well, and pat dry. Keep a dry gauze/pad over the areas that are still draining.  Do keep your Dermatology appointment.  Follow up here in 2 weeks.

## 2022-12-18 ENCOUNTER — Ambulatory Visit: Payer: Self-pay | Admitting: Dermatology

## 2022-12-20 NOTE — Patient Instructions (Incomplete)

## 2022-12-22 ENCOUNTER — Ambulatory Visit (INDEPENDENT_AMBULATORY_CARE_PROVIDER_SITE_OTHER): Payer: Medicaid Other | Admitting: Nurse Practitioner

## 2022-12-22 ENCOUNTER — Encounter: Payer: Self-pay | Admitting: Nurse Practitioner

## 2022-12-22 VITALS — BP 125/85 | HR 72 | Temp 98.4°F | Ht 74.02 in | Wt 256.4 lb

## 2022-12-22 DIAGNOSIS — I69351 Hemiplegia and hemiparesis following cerebral infarction affecting right dominant side: Secondary | ICD-10-CM | POA: Diagnosis not present

## 2022-12-22 DIAGNOSIS — G40909 Epilepsy, unspecified, not intractable, without status epilepticus: Secondary | ICD-10-CM

## 2022-12-22 DIAGNOSIS — M25561 Pain in right knee: Secondary | ICD-10-CM

## 2022-12-22 DIAGNOSIS — I10 Essential (primary) hypertension: Secondary | ICD-10-CM

## 2022-12-22 DIAGNOSIS — F17210 Nicotine dependence, cigarettes, uncomplicated: Secondary | ICD-10-CM

## 2022-12-22 DIAGNOSIS — M25562 Pain in left knee: Secondary | ICD-10-CM

## 2022-12-22 DIAGNOSIS — Z23 Encounter for immunization: Secondary | ICD-10-CM

## 2022-12-22 DIAGNOSIS — I700 Atherosclerosis of aorta: Secondary | ICD-10-CM | POA: Diagnosis not present

## 2022-12-22 DIAGNOSIS — J449 Chronic obstructive pulmonary disease, unspecified: Secondary | ICD-10-CM | POA: Diagnosis not present

## 2022-12-22 DIAGNOSIS — G8929 Other chronic pain: Secondary | ICD-10-CM

## 2022-12-22 DIAGNOSIS — Z8673 Personal history of transient ischemic attack (TIA), and cerebral infarction without residual deficits: Secondary | ICD-10-CM

## 2022-12-22 DIAGNOSIS — Z1159 Encounter for screening for other viral diseases: Secondary | ICD-10-CM

## 2022-12-22 DIAGNOSIS — E782 Mixed hyperlipidemia: Secondary | ICD-10-CM

## 2022-12-22 DIAGNOSIS — R7309 Other abnormal glucose: Secondary | ICD-10-CM

## 2022-12-22 DIAGNOSIS — F1010 Alcohol abuse, uncomplicated: Secondary | ICD-10-CM

## 2022-12-22 LAB — BAYER DCA HB A1C WAIVED: HB A1C (BAYER DCA - WAIVED): 5.9 % — ABNORMAL HIGH (ref 4.8–5.6)

## 2022-12-22 LAB — MICROALBUMIN, URINE WAIVED
Creatinine, Urine Waived: 100 mg/dL (ref 10–300)
Microalb, Ur Waived: 80 mg/L — ABNORMAL HIGH (ref 0–19)

## 2022-12-22 NOTE — Assessment & Plan Note (Signed)
Chronic, ongoing.  Continue current medication regimen and adjust as needed. Lipid panel today. 

## 2022-12-22 NOTE — Assessment & Plan Note (Signed)
Chronic, stable at this time with BP below stroke prevention goal - <130/80.  Recommend he monitor BP at least a few mornings a week at home and document.  DASH diet at home.  Continue current medication regimen and adjust as needed, refills sent in.  Labs today: CMP, urine ALB.  Urine ALB 80 January 2024, may benefit ACE or ARB in future.  Return in 6 months.

## 2022-12-22 NOTE — Assessment & Plan Note (Signed)
A1c mild trend up to 5.9%, monitor closely.  Previous was 5.7%.  Continue diet and exercise focus.

## 2022-12-22 NOTE — Assessment & Plan Note (Signed)
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  Did attend PT for this. May need to return in future if any changes present.  At this time continue current stroke prevention medication regimen and collaboration with neurology.

## 2022-12-22 NOTE — Assessment & Plan Note (Signed)
Chronic, ongoing issue with MRI left knee in hospital noting diffuse degeneration and radial tearing of the lateral meniscus + severe OA & previous right knee imaging noting severe OA.  New referral to ortho placed, did not hear from last referral.

## 2022-12-22 NOTE — Progress Notes (Signed)
BP 125/85   Pulse 72   Temp 98.4 F (36.9 C) (Oral)   Ht 6' 2.02" (1.88 m)   Wt 256 lb 6.4 oz (116.3 kg)   SpO2 98%   BMI 32.91 kg/m    Subjective:    Patient ID: James Lambert, male    DOB: [birth year], 57 y.o.   MRN: [id number]  HPI: James Lambert is a 57 y.o. male  Chief Complaint  Patient presents with   Hypertension   Hyperlipidemia   He never heard from ortho for knee pain, referral placed last visit.  Needs forms completed for PCS at home.  HYPERTENSION / HYPERLIPIDEMIA Three CVA in 2023 -- in April and June with right side being affected, initially could not walk without a walker.  Saw neurology on 10/20/22 with no changes made, to continue Plavix. Currently taking Amlodipine, Metoprolol, Atorvastatin, and Plavix.     He has cut back on alcohol use (had one beer yesterday) -- used to drink heavily. He is a current every day smoker, 10 cigarettes, started smoking in 30's -- was a 1 PPD smoker for many years, sometimes 2.   Continues to take Thiamine and Protonix for prevention with underlying GERD and alcohol use.  Last visit October A1c 5.7%.   Satisfied with current treatment? yes Duration of hypertension: chronic BP monitoring frequency: not checking BP range:  BP medication side effects: no Duration of hyperlipidemia: chronic Cholesterol medication side effects: no Cholesterol supplements: none Medication compliance: good compliance Aspirin: no Recent stressors: no Recurrent headaches: no Visual changes: no Palpitations: no Dyspnea: no Chest pain: no Lower extremity edema: no Dizzy/lightheaded: no   Relevant past medical, surgical, family and social history reviewed and updated as indicated. Interim medical history since our last visit reviewed. Allergies and medications reviewed and updated.  Review of Systems  Constitutional:  Negative for activity change, diaphoresis, fatigue and fever.  Respiratory:  Negative for cough, chest tightness,  shortness of breath and wheezing.   Cardiovascular:  Negative for chest pain, palpitations and leg swelling.  Gastrointestinal: Negative.   Endocrine: Negative for polydipsia, polyphagia and polyuria.  Neurological:  Negative for dizziness, syncope, weakness, light-headedness, numbness and headaches.  Psychiatric/Behavioral: Negative.      Per HPI unless specifically indicated above     Objective:    BP 125/85   Pulse 72   Temp 98.4 F (36.9 C) (Oral)   Ht 6' 2.02" (1.88 m)   Wt 256 lb 6.4 oz (116.3 kg)   SpO2 98%   BMI 32.91 kg/m   Wt Readings from Last 3 Encounters:  12/22/22 256 lb 6.4 oz (116.3 kg)  12/10/22 252 lb (114.3 kg)  11/27/22 256 lb (116.1 kg)    Physical Exam Vitals and nursing note reviewed.  Constitutional:      General: He is awake. He is not in acute distress.    Appearance: He is well-developed and well-groomed. He is obese. He is not ill-appearing or toxic-appearing.  HENT:     Head: Normocephalic and atraumatic.     Right Ear: Hearing and external ear normal. No drainage.     Left Ear: Hearing and external ear normal. No drainage.  Eyes:     General: Lids are normal.        Right eye: No discharge.        Left eye: No discharge.     Conjunctiva/sclera: Conjunctivae normal.     Pupils: Pupils are equal, round, and reactive to light.  Neck:     Thyroid: No thyromegaly.     Vascular: No carotid bruit.  Cardiovascular:     Rate and Rhythm: Normal rate and regular rhythm.     Heart sounds: Normal heart sounds, S1 normal and S2 normal. No murmur heard.    No gallop.  Pulmonary:     Effort: Pulmonary effort is normal. No accessory muscle usage or respiratory distress.     Breath sounds: Normal breath sounds.  Abdominal:     General: Bowel sounds are normal. There is no distension.     Palpations: Abdomen is soft.     Tenderness: There is no abdominal tenderness.  Musculoskeletal:     Cervical back: Normal range of motion and neck supple.      Right knee: Swelling and crepitus present. No erythema or bony tenderness. Decreased range of motion. Tenderness present over the medial joint line. Normal pulse.     Left knee: Swelling and crepitus present. No erythema or bony tenderness. Decreased range of motion. Tenderness present over the medial joint line. Normal pulse.     Right lower leg: 1+ Edema present.     Left lower leg: 1+ Edema present.     Comments: Mild genus valgum noted and antalgic gait present.  Lymphadenopathy:     Head:     Right side of head: No submental, submandibular, tonsillar, preauricular or posterior auricular adenopathy.     Left side of head: No submental, submandibular, tonsillar, preauricular or posterior auricular adenopathy.     Cervical: No cervical adenopathy.  Skin:    General: Skin is warm and dry.     Capillary Refill: Capillary refill takes less than 2 seconds.     Findings: No rash.  Neurological:     Mental Status: He is alert and oriented to person, place, and time.     Deep Tendon Reflexes: Reflexes are normal and symmetric.  Psychiatric:        Attention and Perception: Attention normal.        Mood and Affect: Mood normal.        Speech: Speech normal.        Behavior: Behavior normal. Behavior is cooperative.        Thought Content: Thought content normal.     Results for orders placed or performed during the hospital encounter of 11/13/22  Aerobic/Anaerobic Culture w Gram Stain (surgical/deep wound)   Specimen: PATH Other; Body Fluid  Result Value Ref Range   Specimen Description      ABSCESS Performed at [facility] [address] [place], [place] [address]    Special Requests      LEFT BUTTOCK ABCESS Performed at [facility], [address] [place], [place] [address]    Gram Stain      FEW WBC PRESENT,BOTH PMN AND MONONUCLEAR RARE GRAM POSITIVE COCCI IN PAIRS IN SINGLES    Culture      RARE PREVOTELLA INTERMEDIA BETA LACTAMASE  POSITIVE Performed at [facility], [address] [place], [place] [address]    Report Status 11/17/2022 FINAL       Assessment & Plan:   Problem List Items Addressed This Visit       Cardiovascular and Mediastinum   Aortic atherosclerosis (HCC)   Relevant Orders   Comprehensive metabolic panel   Lipid Panel w/o Chol/HDL Ratio   Hypertension    Chronic, stable at this time with BP below stroke prevention goal - <130/80.  Recommend he  monitor BP at least a few mornings a week at home and document.  DASH diet at home.  Continue current medication regimen and adjust as needed, refills sent in.  Labs today: CMP, urine ALB.  Urine ALB 80 January 2024, may benefit ACE or ARB in future.  Return in 6 months.       Relevant Orders   Microalbumin, Urine Waived   Comprehensive metabolic panel     Respiratory   COPD (chronic obstructive pulmonary disease) ([place])   Relevant Orders   Ambulatory Referral Lung Cancer Screening [facility]     Nervous and Auditory   Hemiparesis affecting right side as late effect of stroke (HCC)    Ongoing, mild to right side post CVA.  Did attend PT for this. May need to return in future if any changes present.  At this time continue current stroke prevention medication regimen and collaboration with neurology.      Seizure cerebral ([place]) - Primary    Stable at this time, presented during CVA -- at this time no seizure medications on board. Continue to collaborate with neurology.        Other   Alcohol abuse    Chronic, ongoing issue.  He has cut back, however recommend complete cessation of alcohol use to help prevent elevation in BP and stroke.  Check CMP today.      Relevant Orders   Comprehensive metabolic panel   Bilateral chronic knee pain    Chronic, ongoing issue with MRI left knee in hospital noting diffuse degeneration and radial tearing of the lateral meniscus + severe OA & previous right knee imaging noting severe OA.  New  referral to ortho placed, did not hear from last referral.      Relevant Orders   Ambulatory referral to Orthopedics   Elevated hemoglobin A1c measurement    A1c mild trend up to 5.9%, monitor closely.  Previous was 5.7%.  Continue diet and exercise focus.      Relevant Orders   Bayer DCA Hb A1c Waived   Microalbumin, Urine Waived   History of CVA (cerebrovascular accident)    Three CVA in 2023.  Continue to collaborate with neurology.  Has family history of CVA, brother.  Recommend continue all current medications and discussed neuro goals: A1c <6.5%, LDL <70, and BP <130/80.  High risk for recurrent CVA, monitor closely and recommend complete cessation of smoking and alcohol use.      Relevant Orders   Comprehensive metabolic panel   Lipid Panel w/o Chol/HDL Ratio   Mixed hyperlipidemia    Chronic, ongoing.  Continue current medication regimen and adjust as needed.  Lipid panel today.       Relevant Orders   Comprehensive metabolic panel   Lipid Panel w/o Chol/HDL Ratio   Nicotine dependence, cigarettes, uncomplicated    I have recommended complete cessation of tobacco use. I have discussed various options available for assistance with tobacco cessation including over the counter methods (Nicotine gum, patch and lozenges). We also discussed prescription options (Chantix, Nicotine Inhaler / Nasal Spray). The patient is not interested in pursuing any prescription tobacco cessation options at this time.  Referral for lung screening, discussed with patient.       Relevant Orders   Ambulatory Referral Lung Cancer Screening [facility]   Other Visit Diagnoses     Need for hepatitis C screening test       Hep C screen on labs today per guidelines for one time screening,  discussed with patient.   Relevant Orders   Hepatitis C antibody   Need for Td vaccine       Td vaccine in office today.   Relevant Orders   Td vaccine greater than or equal to 7yo preservative free IM  (Completed)        Follow up plan: Return in about 6 months (around 06/22/2023) for HTN/HLD, PREDIABETES.

## 2022-12-22 NOTE — Assessment & Plan Note (Signed)
Three CVA in 2023.  Continue to collaborate with neurology.  Has family history of CVA, brother.  Recommend continue all current medications and discussed neuro goals: A1c <6.5%, LDL <70, and BP <130/80.  High risk for recurrent CVA, monitor closely and recommend complete cessation of smoking and alcohol use.

## 2022-12-22 NOTE — Assessment & Plan Note (Signed)
I have recommended complete cessation of tobacco use. I have discussed various options available for assistance with tobacco cessation including over the counter methods (Nicotine gum, patch and lozenges). We also discussed prescription options (Chantix, Nicotine Inhaler / Nasal Spray). The patient is not interested in pursuing any prescription tobacco cessation options at this time.  Referral for lung screening, discussed with patient.

## 2022-12-22 NOTE — Assessment & Plan Note (Signed)
Stable at this time, presented during CVA -- at this time no seizure medications on board. Continue to collaborate with neurology.

## 2022-12-22 NOTE — Assessment & Plan Note (Signed)
Chronic, ongoing issue.  He has cut back, however recommend complete cessation of alcohol use to help prevent elevation in BP and stroke.  Check CMP today.

## 2022-12-23 ENCOUNTER — Telehealth: Payer: Self-pay | Admitting: Nurse Practitioner

## 2022-12-23 LAB — COMPREHENSIVE METABOLIC PANEL
ALT: <5 IU/L (ref 0–44)
AST: 10 IU/L (ref 0–40)
Albumin/Globulin Ratio: 1.1 — ABNORMAL LOW (ref 1.2–2.2)
Albumin: 4.2 g/dL (ref 3.8–4.9)
Alkaline Phosphatase: 101 IU/L (ref 44–121)
BUN/Creatinine Ratio: 6 — ABNORMAL LOW (ref 9–20)
BUN: 6 mg/dL (ref 6–24)
Bilirubin Total: <0.2 mg/dL (ref 0.0–1.2)
CO2: 17 mmol/L — ABNORMAL LOW (ref 20–29)
Calcium: 9.3 mg/dL (ref 8.7–10.2)
Chloride: 103 mmol/L (ref 96–106)
Creatinine, Ser: 0.99 mg/dL (ref 0.76–1.27)
Globulin, Total: 3.7 g/dL (ref 1.5–4.5)
Glucose: 110 mg/dL — ABNORMAL HIGH (ref 70–99)
Potassium: 3.6 mmol/L (ref 3.5–5.2)
Sodium: 140 mmol/L (ref 134–144)
Total Protein: 7.9 g/dL (ref 6.0–8.5)
eGFR: 90 mL/min/{1.73_m2} (ref >59)

## 2022-12-23 LAB — LIPID PANEL W/O CHOL/HDL RATIO
Cholesterol, Total: 113 mg/dL (ref 100–199)
HDL: 35 mg/dL — ABNORMAL LOW (ref >39)
LDL Chol Calc (NIH): 63 mg/dL (ref 0–99)
Triglycerides: 74 mg/dL (ref 0–149)
VLDL Cholesterol Cal: 15 mg/dL (ref 5–40)

## 2022-12-23 LAB — HEPATITIS C ANTIBODY: Hep C Virus Ab: NONREACTIVE

## 2022-12-23 NOTE — Telephone Encounter (Signed)
Paperwork faxed to Arnette of [facility]

## 2022-12-23 NOTE — Telephone Encounter (Signed)
Copied from [place] [id number]. Topic: General - Other >> Dec 23, 2022  1:42 PM [place] B wrote: Reason for CRM: Arnette from [facility] clled in asking if completed can be faxed back ot her at  [phone]

## 2022-12-23 NOTE — Progress Notes (Signed)
Good morning, please let Stevin know labs have returned and overall are remaining stable.  A1c remains in prediabetic range, ensure heavy focus on diet and regular exercise.  Continue all current medications.  Any questions? Keep being amazing!!  Thank you for allowing me to participate in your care.  I appreciate you. Kindest regards, Peterson Mathey

## 2022-12-24 ENCOUNTER — Encounter: Payer: Medicaid Other | Admitting: Surgery

## 2022-12-26 ENCOUNTER — Telehealth: Payer: Self-pay

## 2022-12-26 NOTE — Telephone Encounter (Signed)
Paperwork faxed back to [place]

## 2022-12-29 ENCOUNTER — Encounter: Payer: Medicaid Other | Admitting: Surgery

## 2023-02-04 ENCOUNTER — Encounter: Payer: Self-pay | Admitting: *Deleted

## 2023-02-04 ENCOUNTER — Emergency Department: Payer: Medicaid Other

## 2023-02-04 ENCOUNTER — Emergency Department
Admission: EM | Admit: 2023-02-04 | Discharge: 2023-02-04 | Disposition: A | Discharge status: Home / Self Care | Payer: Medicaid Other | Attending: Emergency Medicine | Admitting: Emergency Medicine

## 2023-02-04 ENCOUNTER — Other Ambulatory Visit: Payer: Self-pay

## 2023-02-04 DIAGNOSIS — J449 Chronic obstructive pulmonary disease, unspecified: Secondary | ICD-10-CM | POA: Insufficient documentation

## 2023-02-04 DIAGNOSIS — I10 Essential (primary) hypertension: Secondary | ICD-10-CM | POA: Diagnosis not present

## 2023-02-04 DIAGNOSIS — Z7982 Long term (current) use of aspirin: Secondary | ICD-10-CM | POA: Diagnosis not present

## 2023-02-04 DIAGNOSIS — S0990XA Unspecified injury of head, initial encounter: Secondary | ICD-10-CM | POA: Diagnosis present

## 2023-02-04 DIAGNOSIS — S060X1A Concussion with loss of consciousness of 30 minutes or less, initial encounter: Secondary | ICD-10-CM | POA: Diagnosis not present
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
## 2023-02-09 ENCOUNTER — Telehealth: Payer: Self-pay

## 2023-02-09 NOTE — Transitions of Care (Post Inpatient/ED Visit) (Signed)
   02/09/2023  Name: James Lambert MRN: [id number] DOB: 03-Sep-1966  Today's TOC FU Call Status: Today's TOC FU Call Status:: Successful TOC FU Call Competed TOC FU Call Complete Date: 02/09/23  Transition Care Management Follow-up Telephone Call Discharge Facility: [facility]) Type of Discharge: Emergency Department Reason for ED Visit: Other: How have you been since you were released from the hospital?: Better Any questions or concerns?: Yes Patient Questions/Concerns:: Patient states that he is off balance still  Items Reviewed: Did you receive and understand the discharge instructions provided?: Yes Medications obtained and verified?: Yes (Medications Reviewed) Any new allergies since your discharge?: No Dietary orders reviewed?: No Do you have support at home?: No  Home Care and Equipment/Supplies: [place] Ordered?: No Any new equipment or medical supplies ordered?: No  Functional Questionnaire: Do you need assistance with bathing/showering or dressing?: No Do you need assistance with meal preparation?: No Do you need assistance with eating?: No Do you have difficulty maintaining continence: No Do you need assistance with getting out of bed/getting out of a chair/moving?: No Do you have difficulty managing or taking your medications?: No  Folllow up appointments reviewed: PCP Follow-up appointment confirmed?: Yes Date of PCP follow-up appointment?: 02/13/23 [facility] Follow-up appointment confirmed?: No Do you need transportation to your follow-up appointment?: Yes Transportation Need Intervention Addressed By:: Other: Do you understand care options if your condition(s) worsen?: Yes-patient verbalized understanding    SIGNATURE:  Donzetta Kohut, [facility]

## 2023-02-09 NOTE — Transitions of Care (Post Inpatient/ED Visit) (Deleted)
   02/09/2023  Name: James Lambert MRN: [id number] DOB: Jun 22, 1966  Today's TOC FU Call Status: Today's TOC FU Call Status:: Successful TOC FU Call Competed TOC FU Call Complete Date: 02/09/23  Transition Care Management Follow-up Telephone Call Discharge Facility: [facility]) Type of Discharge: Emergency Department Reason for ED Visit: Other: How have you been since you were released from the hospital?: Better Any questions or concerns?: Yes Patient Questions/Concerns:: Patient states that he is off balance still  Items Reviewed: Did you receive and understand the discharge instructions provided?: Yes Medications obtained and verified?: Yes (Medications Reviewed) Any new allergies since your discharge?: No Dietary orders reviewed?: No Do you have support at home?: No  Home Care and Equipment/Supplies: [place] Ordered?: No Any new equipment or medical supplies ordered?: No  Functional Questionnaire: Do you need assistance with bathing/showering or dressing?: No Do you need assistance with meal preparation?: No Do you need assistance with eating?: No Do you have difficulty maintaining continence: No Do you need assistance with getting out of bed/getting out of a chair/moving?: No Do you have difficulty managing or taking your medications?: No  Folllow up appointments reviewed:      SIGNATURE***

## 2023-02-13 ENCOUNTER — Ambulatory Visit (INDEPENDENT_AMBULATORY_CARE_PROVIDER_SITE_OTHER): Payer: Medicaid Other | Admitting: Nurse Practitioner

## 2023-02-13 ENCOUNTER — Encounter: Payer: Self-pay | Admitting: Nurse Practitioner

## 2023-02-13 VITALS — BP 158/98 | HR 76 | Temp 98.8°F | Wt 249.7 lb

## 2023-02-13 DIAGNOSIS — F17210 Nicotine dependence, cigarettes, uncomplicated: Secondary | ICD-10-CM

## 2023-02-13 DIAGNOSIS — I10 Essential (primary) hypertension: Secondary | ICD-10-CM

## 2023-02-13 DIAGNOSIS — F1010 Alcohol abuse, uncomplicated: Secondary | ICD-10-CM | POA: Diagnosis not present

## 2023-02-13 DIAGNOSIS — H6692 Otitis media, unspecified, left ear: Secondary | ICD-10-CM | POA: Insufficient documentation

## 2023-02-13 DIAGNOSIS — H6502 Acute serous otitis media, left ear: Secondary | ICD-10-CM

## 2023-02-13 DIAGNOSIS — Z8673 Personal history of transient ischemic attack (TIA), and cerebral infarction without residual deficits: Secondary | ICD-10-CM

## 2023-02-13 MED ORDER — AMLODIPINE BESYLATE 10 MG PO TABS: 10.0000 mg | ORAL_TABLET | Freq: Every day | ORAL | 4 refills | Status: DC

## 2023-02-13 MED ORDER — METOPROLOL SUCCINATE ER 50 MG PO TB24: 50.0000 mg | ORAL_TABLET | Freq: Every day | ORAL | 4 refills | Status: DC

## 2023-02-13 MED ORDER — OLMESARTAN MEDOXOMIL 5 MG PO TABS: 10.0000 mg | ORAL_TABLET | Freq: Every day | ORAL | 4 refills | Status: DC

## 2023-02-13 MED ORDER — AMOXICILLIN-POT CLAVULANATE 875-125 MG PO TABS: 1.0000 | ORAL_TABLET | Freq: Two times a day (BID) | ORAL | 0 refills | Status: AC

## 2023-02-13 MED ORDER — PREDNISONE 20 MG PO TABS: 40.0000 mg | ORAL_TABLET | Freq: Every day | ORAL | 0 refills | Status: AC
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
## 2023-02-13 NOTE — Assessment & Plan Note (Signed)
Chronic, ongoing issue.  He has cut back, however recommend complete cessation of alcohol use to help prevent elevation in BP and stroke.

## 2023-02-13 NOTE — Patient Instructions (Signed)
  Earache, Adult An earache, or ear pain, can be caused by many things, including: An infection. Ear wax buildup. Ear pressure. Something in the ear that should not be there (foreign body). A sore throat. Tooth problems. Jaw problems. Treatment of the earache will depend on the cause. If the cause is not clear or cannot be known, you may need to watch your symptoms until your earache goes away or until a cause is found. Follow these instructions at home: Medicines Take or apply over-the-counter and prescription medicines only as told by your health care provider. If you were prescribed antibiotics, use them as told by your health care provider. Do not stop using the antibiotic even if you start to feel better. Do not put anything in your ear other than medicine that is prescribed by your health care provider. Managing pain     If directed, apply heat to the affected area as often as told by your health care provider. Use the heat source that your health care provider recommends, such as a moist heat pack or a heating pad. Place a towel between your skin and the heat source. Leave the heat on for 20-30 minutes. If your skin turns bright red, remove the heat right away to prevent burns. The risk of burns is higher if you cannot feel pain, heat, or cold. If directed, put ice on the affected area. To do this: Put ice in a plastic bag. Place a towel between your skin and the bag. Leave the ice on for 20 minutes, 2-3 times a day. If your skin turns bright red, remove the ice right away to prevent skin damage. The risk of skin damage is higher if you cannot feel pain, heat, or cold.  General instructions Pay attention to any changes in your symptoms. Try resting in an upright position instead of lying down. This may help to reduce pressure in your ear and relieve pain. Chew gum if it helps to relieve your ear pain. Treat any allergies as told by your health care provider. Drink enough  fluid to keep your urine pale yellow. It is up to you to get the results of any tests that were done. Ask your health care provider, or the department that is doing the tests, when your results will be ready. Contact a health care provider if: Your pain does not improve within 2 days. Your earache gets worse. You have new symptoms. You have a fever. Get help right away if: You have a severe headache. You have a stiff neck. You have trouble swallowing. You have redness or swelling behind your ear. You have fluid or blood coming from your ear. You have hearing loss. You feel dizzy. This information is not intended to replace advice given to you by your health care provider. Make sure you discuss any questions you have with your health care provider. Document Revised: 04/07/2022 Document Reviewed: 04/07/2022 Elsevier Patient Education  2023 Elsevier Inc.  

## 2023-02-13 NOTE — Assessment & Plan Note (Signed)
Three CVA in 2023.  Continue to collaborate with neurology.  Has family history of CVA, brother.  Recommend continue all current medications and discussed neuro goals: A1c <6.5%, LDL <70, and BP <130/80.  High risk for recurrent CVA, monitor closely and recommend complete cessation of smoking and alcohol use.  Have placed Managed Medicaid referral as concern patient is not taking medication as ordered due to costs, also SW may be beneficial to assist in alcohol reduction.  Discussed need to reduce alcohol use and smoking.  Return in 2 weeks.

## 2023-02-13 NOTE — Assessment & Plan Note (Signed)
Chronic, with elevation on initial and recheck.  Has been out of Amlodipine 2 days due to cost.  BP stroke prevention goal - <130/80, discussed at length with him his higher risk for subsequent CVA.  Start Olmesartan 10 MG daily (smoker avoid ACE) and continue Amlodipine 10 MG and Metoprolol at this time.  Recommend he monitor BP at least a few mornings a week at home and document.  DASH diet at home.  Continue current medication regimen and adjust as needed, refills sent in.  Labs today: up to date.  Urine ALB 80 January 2024, adding on ARB.  Have placed Managed Medicaid referral as concern patient is not taking medication as ordered due to costs, also SW may be beneficial to assist in alcohol reduction.  Discussed need to reduce alcohol use and smoking.  Return in 2 weeks.
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
## 2023-02-13 NOTE — Assessment & Plan Note (Signed)
I have recommended complete cessation of tobacco use. I have discussed various options available for assistance with tobacco cessation including over the counter methods (Nicotine gum, patch and lozenges). We also discussed prescription options (Chantix, Nicotine Inhaler / Nasal Spray). The patient is not interested in pursuing any prescription tobacco cessation options at this time.  Referral for lung screening, discussed with patient.  He wishes to hold off.

## 2023-02-17 ENCOUNTER — Telehealth: Payer: Self-pay | Admitting: Nurse Practitioner

## 2023-02-17 NOTE — Telephone Encounter (Unsigned)
Copied from [place] [phone]. Topic: General - Other >> Feb 17, 2023 12:45 PM Sabas Sous wrote: Reason for CRM: Pt has a question for his PCP, he declined offer to schedule an appt.

## 2023-02-18 NOTE — Telephone Encounter (Signed)
Unable to reach patient, will try  later

## 2023-02-18 NOTE — Telephone Encounter (Signed)
Reached out to patient, he stated that he was all good and did not need anything at this time

## 2023-02-18 NOTE — Telephone Encounter (Signed)
Noted  

## 2023-02-19 ENCOUNTER — Other Ambulatory Visit: Payer: Medicaid Other | Admitting: Licensed Clinical Social Worker

## 2023-02-19 NOTE — Patient Outreach (Signed)
Medicaid Managed Care Social Work Note  02/19/2023 Name:  James Lambert MRN:  [id number] DOB:  06-Dec-1966  James Lambert is an 57 y.o. year old male who is a primary patient of Cannady, Barbaraann Faster, NP.  The [facility] Managed Care Coordination team was consulted for assistance with:  [place] and Resources and Medication Management  James Lambert was given information about Medicaid Managed Care Coordination team services today. Patient reports that he is unable to complete appointment today with [facility] LCSW and would like to reschedule. He reports preferring morning appointments. Patient reports being fine with his upcoming scheduled appointment with [facility] Pharmacist next week as it is at 67 am. [facility] LCSW successfully rescheduled his social work initial appointment for 02/25/23 at 10:30 am.   Assessments/Interventions:  Review of past medical history, allergies, medications, health status, including review of consultants reports, laboratory and other test data, was performed as part of comprehensive evaluation and provision of chronic care management services.  SDOH: (Social Determinant of Health) assessments and interventions performed: SDOH Interventions    Flowsheet Row Office Visit from 09/10/2022 in [place]  SDOH Interventions   Alcohol Usage Interventions Alohol Education/Brief Advice       Care Plan                 No Known Allergies  Medications Reviewed Today     Reviewed by Venita Lick, NP (Nurse Practitioner) on 02/13/23 at 1410  Med List Status: <None>   Medication Order Taking? Sig Documenting Provider Last Dose Status Informant  acetaminophen (TYLENOL) 325 MG tablet [id number] Yes Take 2 tablets (650 mg total) by mouth every 6 (six) hours as needed for mild pain. Marnee Guarneri T, NP Taking Active   acetaminophen (TYLENOL) 500 MG tablet [id number] Yes Take 2 tablets (1,000 mg total) by mouth every 6 (six) hours as needed for mild  pain. Olean Ree, MD Taking Active   albuterol (VENTOLIN HFA) 108 (90 Base) MCG/ACT inhaler [id number] Yes Inhale 1-2 puffs into the lungs every 6 (six) hours as needed for wheezing or shortness of breath. [place]ie, Chioma E, NP Taking Active   amLODipine (NORVASC) 10 MG tablet [id number]  Take 1 tablet (10 mg total) by mouth once daily. Marnee Guarneri T, NP  Active   atorvastatin (LIPITOR) 80 MG tablet [id number] Yes Take 1 tablet (80 mg total) by mouth once daily.  Patient taking differently: Take 80 mg by mouth every morning.   Marnee Guarneri T, NP Taking Active   clopidogrel (PLAVIX) 75 MG tablet [id number] Yes Take 1 tablet (75 mg total) by mouth daily. Marnee Guarneri T, NP Taking Active   metoprolol succinate (TOPROL-XL) 50 MG 24 hr tablet [id number]  Take 1 tablet (50 mg total) by mouth daily. Take with or immediately following a meal. Cannady, Jolene T, NP  Active   Multiple Vitamin (MULTIVITAMIN WITH MINERALS) TABS tablet [id number] Yes Take 1 tablet by mouth daily. Setzer, Edman Circle, PA-C Taking Active   olmesartan (BENICAR) 5 MG tablet [id number] Yes Take 2 tablets (10 mg total) by mouth daily. Marnee Guarneri T, NP  Active   pantoprazole (PROTONIX) 40 MG tablet [id number] Yes Take 1 tablet (40 mg total) by mouth at bedtime.  Patient taking differently: Take 40 mg by mouth every evening.   Marnee Guarneri T, NP Taking Active   thiamine (VITAMIN B-1) 100 MG tablet [id number] Yes Take 1 tablet (100 mg total) by mouth daily. 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  Eula Fried, BSW, MSW, [employer] Managed Medicaid LCSW [email or web address] Phone: [phone]

## 2023-02-25 ENCOUNTER — Other Ambulatory Visit: Payer: Medicaid Other | Admitting: Licensed Clinical Social Worker

## 2023-02-25 NOTE — Patient Instructions (Signed)
Visit Information  James Lambert was given information about Medicaid Managed Care team care coordination services as a part of their Healthy [facility] Medicaid benefit. [name] verbally consented to engagement with the [facility] Managed Care team.   If you are experiencing a medical emergency, please call 911 or report to your local emergency department or urgent care.   If you have a non-emergency medical problem during routine business hours, please contact your provider's office and ask to speak with a nurse.   For questions related to your Healthy [facility] health plan, please call: [phone] or visit the homepage here: [email or web address]  If you would like to schedule transportation through your Healthy [facility] plan, please call the following number at least 2 days in advance of your appointment: [phone]  For information about your ride after you set it up, call Ride Assist at [phone]. Use this number to activate a Will Call pickup, or if your transportation is late for a scheduled pickup. Use this number, too, if you need to make a change or cancel a previously scheduled reservation.  If you need transportation services right away, call [phone]. The after-hours call center is staffed 24 hours to handle ride assistance and urgent reservation requests (including discharges) 365 days a year. Urgent trips include sick visits, hospital discharge requests and life-sustaining treatment.  Call the [place] at [phone], at any time, 24 hours a day, 7 days a week. If you are in danger or need immediate medical attention call 911.  If you would like help to quit smoking, call 1-800-QUIT-NOW (619)558-1431) OR Espaol: 1-855-Djelo-Ya HD:1601594) o para ms informacin haga clic aqu or Text READY to 200-400 to register via text

## 2023-02-25 NOTE — Patient Outreach (Signed)
Medicaid Managed Care Social Work Note  02/25/2023 Name:  James Lambert MRN:  [id number] DOB:  1966-06-16  CHRSTOPHER DOWTIN is an 57 y.o. year old male who is a primary patient of Cannady, Barbaraann Faster, NP.  The Medicaid Managed Care Coordination team was consulted for assistance with:  Mental Health Counseling and Resources  Mr. Congleton was given information about Medicaid Managed Care Coordination team services today. Gareth Morgan Patient agreed to services and verbal consent obtained.  Engaged with patient  for by telephone in response to referral for case management and/or care coordination services. Patient reports ONLY wanting pharmacy or nursing assistance at this time. He denies needing relapse prevention or mental health resources. Roland LCSW will update [facility] Pharmacist who is scheduled with him for next week.   Assessments/Interventions:  Review of past medical history, allergies, medications, health status, including review of consultants reports, laboratory and other test data, was performed as part of comprehensive evaluation and provision of chronic care management services.  SDOH: (Social Determinant of Health) assessments and interventions performed: SDOH Interventions    Flowsheet Row Patient Outreach Telephone from 02/25/2023 in [place] Office Visit from 09/10/2022 in [place]  SDOH Interventions    Transportation Interventions Intervention Not Indicated --  Alcohol Usage Interventions -- Alohol Education/Brief Advice  Stress Interventions Offered [employer], Provide Counseling --       Advanced Directives Status:  Not addressed in this encounter.  Care Plan                 No Known Allergies  Medications Reviewed Today     Reviewed by Venita Lick, NP (Nurse Practitioner) on 02/13/23 at 1410  Med List Status: <None>   Medication Order Taking? Sig Documenting Provider Last Dose Status  Informant  acetaminophen (TYLENOL) 325 MG tablet [id number] Yes Take 2 tablets (650 mg total) by mouth every 6 (six) hours as needed for mild pain. Marnee Guarneri T, NP Taking Active   acetaminophen (TYLENOL) 500 MG tablet [id number] Yes Take 2 tablets (1,000 mg total) by mouth every 6 (six) hours as needed for mild pain. Olean Ree, MD Taking Active   albuterol (VENTOLIN HFA) 108 (90 Base) MCG/ACT inhaler [id number] Yes Inhale 1-2 puffs into the lungs every 6 (six) hours as needed for wheezing or shortness of breath. Iloabachie, Chioma E, NP Taking Active   amLODipine (NORVASC) 10 MG tablet [id number]  Take 1 tablet (10 mg total) by mouth once daily. Marnee Guarneri T, NP  Active   atorvastatin (LIPITOR) 80 MG tablet [id number] Yes Take 1 tablet (80 mg total) by mouth once daily.  Patient taking differently: Take 80 mg by mouth every morning.   Marnee Guarneri T, NP Taking Active   clopidogrel (PLAVIX) 75 MG tablet [id number] Yes Take 1 tablet (75 mg total) by mouth daily. Marnee Guarneri T, NP Taking Active   metoprolol succinate (TOPROL-XL) 50 MG 24 hr tablet [id number]  Take 1 tablet (50 mg total) by mouth daily. Take with or immediately following a meal. Cannady, Jolene T, NP  Active   Multiple Vitamin (MULTIVITAMIN WITH MINERALS) TABS tablet [id number] Yes Take 1 tablet by mouth daily. Setzer, Edman Circle, PA-C Taking Active   olmesartan (BENICAR) 5 MG tablet [id number] Yes Take 2 tablets (10 mg total) by mouth daily. Marnee Guarneri T, NP  Active   pantoprazole (PROTONIX) 40 MG tablet [id number] Yes Take 1 tablet (40 mg total) by mouth at  bedtime.  Patient taking differently: Take 40 mg by mouth every evening.   Marnee Guarneri T, NP Taking Active   thiamine (VITAMIN B-1) 100 MG tablet [id number] Yes Take 1 tablet (100 mg total) by mouth daily. Marnee Guarneri T, NP Taking Active             Patient Active Problem List   Diagnosis Date Noted   Left otitis media 02/13/2023   Elevated hemoglobin  A1c measurement 09/11/2022   Hemiparesis affecting right side as late effect of stroke ([place]) 09/10/2022   Bilateral chronic knee pain 09/10/2022   Aortic atherosclerosis ([place]) 09/10/2022   GERD without esophagitis 08/30/2022   Mixed hyperlipidemia 08/30/2022   Alcohol abuse 08/30/2022   Seizure cerebral ([place]) 05/10/2022   Hypertension    Nicotine dependence, cigarettes, uncomplicated    Hydradenitis    COPD (chronic obstructive pulmonary disease) ([place])    History of CVA (cerebrovascular accident) 03/08/2022   Date of next scheduled Social Work care management/care coordination outreach:  03/03/23.  Eula Fried, BSW, MSW, [employer] Managed Medicaid LCSW [email or web address] Phone: [phone]

## 2023-03-03 ENCOUNTER — Ambulatory Visit: Payer: Medicaid Other | Admitting: Pharmacist

## 2023-03-09 ENCOUNTER — Other Ambulatory Visit: Payer: Medicaid Other

## 2023-03-09 ENCOUNTER — Other Ambulatory Visit: Payer: Self-pay

## 2023-03-09 DIAGNOSIS — Z87891 Personal history of nicotine dependence: Secondary | ICD-10-CM

## 2023-03-09 DIAGNOSIS — F17210 Nicotine dependence, cigarettes, uncomplicated: Secondary | ICD-10-CM

## 2023-03-09 NOTE — Progress Notes (Signed)
03/09/2023 Name: James Lambert MRN: [id number] DOB: [birth year]  Chief Complaint  Patient presents with   Medication Management   James Lambert is a 57 y.o. year old male who presented for a telephone visit.   They were referred to the pharmacist by their PCP for assistance in managing medication access.   Patient is participating in a Managed Medicaid Plan:  Yes  Subjective: Telephone visit to discuss and assist with barriers to adherence:  access and affordability of medications Care Team: Primary Care Provider: Venita Lick, NP ; Next Scheduled Visit: 03/27/23 Medication Access/Adherence Current Pharmacy:  [facility] [address] (N), [place] [address] [place]) [place] [address] Phone: [phone] Fax: [phone]  Patient reports affordability concerns with their medications: Yes  Patient reports access/transportation concerns to their pharmacy: No  Patient reports adherence concerns with their medications:  Yes    Medication Management: Current adherence strategy:  -Patient reports Fair adherence to medications -Patient reports the following barriers to adherence: inability to always be able to afford $4 managed medicaid copays for all medications he is prescribed -Has recently gone without maintenance medications for BP, HLD, and TIA prevention because of this -Endorses never picking up or starting on olmesartan 10mg  daily prescribed at 3/8 PCP visit  Objective: BP 158/98 on 02/13/23 Lab Results  Component Value Date   HGBA1C 5.9 (H) 12/22/2022   Lab Results  Component Value Date   CREATININE 0.99 12/22/2022   BUN 6 12/22/2022   NA 140 12/22/2022   K 3.6 12/22/2022   CL 103 12/22/2022   CO2 17 (L) 12/22/2022   Lab Results  Component Value Date   CHOL 113 12/22/2022   HDL 35 (L) 12/22/2022   LDLCALC 63 12/22/2022   TRIG 74 12/22/2022   CHOLHDL 4.4 05/11/2022   Medications Reviewed Today      Reviewed by Darlina Guys, [place] (Pharmacist) on 03/09/23 at 14  Med List Status: <None>   Medication Order Taking? Sig Documenting Provider Last Dose Status Informant  acetaminophen (TYLENOL) 325 MG tablet [id number] Yes Take 2 tablets (650 mg total) by mouth every 6 (six) hours as needed for mild pain. Marnee Guarneri T, NP Taking Active   albuterol (VENTOLIN HFA) 108 (90 Base) MCG/ACT inhaler [id number] Yes Inhale 1-2 puffs into the lungs every 6 (six) hours as needed for wheezing or shortness of breath. Iloabachie, Chioma E, NP Taking Active   amLODipine (NORVASC) 10 MG tablet [id number] Yes Take 1 tablet (10 mg total) by mouth once daily. Marnee Guarneri T, NP Taking Active   atorvastatin (LIPITOR) 80 MG tablet [id number] Yes Take 1 tablet (80 mg total) by mouth once daily.  Patient taking differently: Take 80 mg by mouth every morning.   Marnee Guarneri T, NP Taking Active   clopidogrel (PLAVIX) 75 MG tablet [id number] Yes Take 1 tablet (75 mg total) by mouth daily. Marnee Guarneri T, NP Taking Active   metoprolol succinate (TOPROL-XL) 50 MG 24 hr tablet [id number] Yes Take 1 tablet (50 mg total) by mouth daily. Take with or immediately following a meal. Cannady, Jolene T, NP Taking Active   Multiple Vitamin (MULTIVITAMIN WITH MINERALS) TABS tablet [id number] Yes Take 1 tablet by mouth daily. Setzer, Ed[address] Circle, PA-C Taking Active   olmesartan (BENICAR) 5 MG tablet [id number] No Take 2 tablets (10 mg total) by mouth daily.  Patient not taking: Reported on 03/09/2023   Venita Lick, NP Not Taking Active  Med Note Colin Rhein, Christon Parada A   Mon Mar 09, 2023  3:12 PM) Patient states he has not been taking this medication  pantoprazole (PROTONIX) 40 MG tablet [id number] Yes Take 1 tablet (40 mg total) by mouth at bedtime.  Patient taking differently: Take 40 mg by mouth every evening.   Marnee Guarneri T, NP Taking Active   thiamine (VITAMIN B-1) 100 MG tablet [id number] Yes Take 1 tablet (100 mg  total) by mouth daily. Marnee Guarneri T, NP Taking Active            Assessment/Plan:   Medication Management: - Currently strategy insufficient to maintain appropriate adherence to prescribed medication regimen - Patient endorsed ability to get to [facility] outpatient pharmacy at [facility] if pharmacy able to waive any copays or allow him to utilize a charge account .  Pharmacy verified they would be able to do so. [facility] outpatient pharmacy is contacting [facility] to transfer and fill all maintenance medications for the patient.  All should have refills and go through patient's plan okay, but the pharmacy will contact me if any issues arise  Follow Up Plan: Telephone call scheduled for next Monday to check in with patient to make sure he received all medications and see if he can provide any home BP values  Darlina Guys, PharmD, DPLA

## 2023-03-10 ENCOUNTER — Ambulatory Visit: Payer: Medicaid Other | Admitting: Nurse Practitioner

## 2023-03-10 ENCOUNTER — Other Ambulatory Visit: Payer: Self-pay

## 2023-03-10 MED ORDER — PANTOPRAZOLE SODIUM 40 MG PO TBEC
40.0000 mg | DELAYED_RELEASE_TABLET | Freq: Every day | ORAL | 4 refills | Status: DC
  Filled 2023-03-10: qty 90, 90d supply, fill #0

## 2023-03-10 MED ORDER — CLOPIDOGREL BISULFATE 75 MG PO TABS
75.0000 mg | ORAL_TABLET | Freq: Every day | ORAL | 4 refills | Status: DC
  Filled 2023-03-10: qty 90, 90d supply, fill #0

## 2023-03-10 MED ORDER — OLMESARTAN MEDOXOMIL 5 MG PO TABS
10.0000 mg | ORAL_TABLET | Freq: Every day | ORAL | 4 refills | Status: DC
  Filled 2023-03-10: qty 60, 30d supply, fill #0

## 2023-03-10 MED ORDER — METOPROLOL SUCCINATE ER 50 MG PO TB24
50.0000 mg | ORAL_TABLET | Freq: Every day | ORAL | 4 refills | Status: DC
  Filled 2023-03-10: qty 90, 90d supply, fill #0

## 2023-03-10 MED ORDER — AMLODIPINE BESYLATE 10 MG PO TABS
10.0000 mg | ORAL_TABLET | Freq: Every day | ORAL | 4 refills | Status: DC
  Filled 2023-03-10: qty 90, 90d supply, fill #0

## 2023-03-10 MED ORDER — ATORVASTATIN CALCIUM 80 MG PO TABS
80.0000 mg | ORAL_TABLET | Freq: Every day | ORAL | 4 refills | Status: DC
  Filled 2023-03-10: qty 90, 90d supply, fill #0

## 2023-03-16 ENCOUNTER — Other Ambulatory Visit: Payer: Medicaid Other

## 2023-03-16 NOTE — Progress Notes (Signed)
   03/16/2023  Patient ID: James Lambert, male   DOB: 11-Jan-1966, 57 y.o.   MRN: [id number]  Outreach for scheduled telephone follow-up visit unsuccessful, but I was able to leave my direct number for patient to return my call to reschedule at his convenience.  Will attempt outreach again in 2-3 business days if I have not heard back.   Lenna Gilford, PharmD, DPLA

## 2023-03-17 ENCOUNTER — Other Ambulatory Visit: Payer: Self-pay | Admitting: Pharmacist

## 2023-03-17 NOTE — Progress Notes (Signed)
Patient outreached by Wynn Maudlin, PharmD Candidate on 03/17/23 to discuss hypertension   Patient has an automated home blood pressure machine. Patient has not been monitoring BP at home. I encouraged him to begin monitoring and recording his BP daily leading up to his appointment on 03/27/23. I reminded him to bring his home BP monitor with him to his next appointment for accuracy comparison.    Medication review was performed. They are taking medications as prescribed. He plans to obtain refills at [facility] when he runs out of medication.   The following barriers to adherence were noted:  - They do not have cost concerns.  - They do have transportation concerns. Patient stated he currently has someone to take him to appointments/pharmacy. I let him know to reach out if he needs assistance.  - They do not need assistance obtaining refills.  - They do not occasionally forget to take some of their prescribed medications.  - They do feel like one/some of their medications make them feel poorly. Reports occasional dizziness. I discussed the importance of slowly moving from sitting or lying down to standing to allow BP to adjust, in addition to importance of monitoring BP at home.  - They do not have questions or concerns about their medications.  - They do have follow up scheduled with their primary care provider/cardiologist.   The following interventions were completed:  - Medications were reviewed  - Patient was educated on goal blood pressures and long term health implications of elevated blood pressure  - Patient was educated on proper technique to check home blood pressure and reminded to bring home machine and readings to next provider appointment  - Patient was educated on medications, including indication and administration  - Patient was counseled on lifestyle modifications to improve blood pressure, including DASH diet, reduction of sodium intake, and physical activity. Patient is trying  to avoid fried foods and goes for walks at a nearby track when the weather allows.  - Patient was counseled on benefit of tobacco cessation   The patient has follow up scheduled: 03/27/23  PCP: Aura Dials, NP    Wynn Maudlin, PharmD Candidate   Catie Eppie Gibson, PharmD, BCACP, CPP [facility] [phone]

## 2023-03-18 ENCOUNTER — Observation Stay
Admission: EM | Admit: 2023-03-18 | Discharge: 2023-03-18 | Discharge status: Against Medical Advice | Payer: Medicaid Other | Attending: Family Medicine | Admitting: Family Medicine

## 2023-03-18 ENCOUNTER — Emergency Department: Payer: Medicaid Other

## 2023-03-18 DIAGNOSIS — Z1152 Encounter for screening for COVID-19: Secondary | ICD-10-CM | POA: Diagnosis not present

## 2023-03-18 DIAGNOSIS — R059 Cough, unspecified: Secondary | ICD-10-CM | POA: Diagnosis not present

## 2023-03-18 DIAGNOSIS — J441 Chronic obstructive pulmonary disease with (acute) exacerbation: Secondary | ICD-10-CM | POA: Diagnosis not present

## 2023-03-18 DIAGNOSIS — J449 Chronic obstructive pulmonary disease, unspecified: Secondary | ICD-10-CM | POA: Diagnosis not present

## 2023-03-18 DIAGNOSIS — F10920 Alcohol use, unspecified with intoxication, uncomplicated: Principal | ICD-10-CM

## 2023-03-18 DIAGNOSIS — F1010 Alcohol abuse, uncomplicated: Principal | ICD-10-CM | POA: Insufficient documentation

## 2023-03-18 DIAGNOSIS — N179 Acute kidney failure, unspecified: Secondary | ICD-10-CM | POA: Diagnosis not present

## 2023-03-18 DIAGNOSIS — I10 Essential (primary) hypertension: Secondary | ICD-10-CM | POA: Insufficient documentation

## 2023-03-18 DIAGNOSIS — R0602 Shortness of breath: Secondary | ICD-10-CM | POA: Diagnosis present

## 2023-03-18 LAB — TROPONIN I (HIGH SENSITIVITY): Troponin I (High Sensitivity): 7 ng/L (ref <18)

## 2023-03-18 LAB — HEPATIC FUNCTION PANEL
ALT: 13 U/L (ref 0–44)
AST: 23 U/L (ref 15–41)
Albumin: 3.6 g/dL (ref 3.5–5.0)
Alkaline Phosphatase: 102 U/L (ref 38–126)
Bilirubin, Direct: <0.1 mg/dL (ref 0.0–0.2)
Total Bilirubin: 0.4 mg/dL (ref 0.3–1.2)
Total Protein: 8.1 g/dL (ref 6.5–8.1)

## 2023-03-18 LAB — BASIC METABOLIC PANEL
Anion gap: 13 (ref 5–15)
Anion gap: 12 (ref 5–15)
BUN: 10 mg/dL (ref 6–20)
BUN: 10 mg/dL (ref 6–20)
CO2: 23 mmol/L (ref 22–32)
CO2: 23 mmol/L (ref 22–32)
Calcium: 8.5 mg/dL — ABNORMAL LOW (ref 8.9–10.3)
Calcium: 7.9 mg/dL — ABNORMAL LOW (ref 8.9–10.3)
Chloride: 98 mmol/L (ref 98–111)
Chloride: 98 mmol/L (ref 98–111)
Creatinine, Ser: 2.18 mg/dL — ABNORMAL HIGH (ref 0.61–1.24)
Creatinine, Ser: 2.27 mg/dL — ABNORMAL HIGH (ref 0.61–1.24)
GFR, Estimated: 35 mL/min — ABNORMAL LOW (ref >60)
GFR, Estimated: 33 mL/min — ABNORMAL LOW (ref >60)
Glucose, Bld: 124 mg/dL — ABNORMAL HIGH (ref 70–99)
Glucose, Bld: 135 mg/dL — ABNORMAL HIGH (ref 70–99)
Potassium: 3.2 mmol/L — ABNORMAL LOW (ref 3.5–5.1)
Potassium: 3 mmol/L — ABNORMAL LOW (ref 3.5–5.1)
Sodium: 134 mmol/L — ABNORMAL LOW (ref 135–145)
Sodium: 133 mmol/L — ABNORMAL LOW (ref 135–145)

## 2023-03-18 LAB — CBC WITH DIFFERENTIAL/PLATELET
Abs Immature Granulocytes: 0.14 10*3/uL — ABNORMAL HIGH (ref 0.00–0.07)
Basophils Absolute: 0 10*3/uL (ref 0.0–0.1)
Basophils Relative: 1 %
Eosinophils Absolute: 0.2 10*3/uL (ref 0.0–0.5)
Eosinophils Relative: 2 %
HCT: 39.5 % (ref 39.0–52.0)
Hemoglobin: 12.7 g/dL — ABNORMAL LOW (ref 13.0–17.0)
Immature Granulocytes: 2 %
Lymphocytes Relative: 33 %
Lymphs Abs: 2.8 10*3/uL (ref 0.7–4.0)
MCH: 28 pg (ref 26.0–34.0)
MCHC: 32.2 g/dL (ref 30.0–36.0)
MCV: 87 fL (ref 80.0–100.0)
Monocytes Absolute: 0.7 10*3/uL (ref 0.1–1.0)
Monocytes Relative: 9 %
Neutro Abs: 4.8 10*3/uL (ref 1.7–7.7)
Neutrophils Relative %: 53 %
Platelets: 322 10*3/uL (ref 150–400)
RBC: 4.54 MIL/uL (ref 4.22–5.81)
RDW: 16.5 % — ABNORMAL HIGH (ref 11.5–15.5)
WBC: 8.6 10*3/uL (ref 4.0–10.5)
nRBC: 0.2 % (ref 0.0–0.2)

## 2023-03-18 LAB — RESP PANEL BY RT-PCR (RSV, FLU A&B, COVID)  RVPGX2
Influenza A by PCR: NEGATIVE
Influenza B by PCR: NEGATIVE
Resp Syncytial Virus by PCR: NEGATIVE
SARS Coronavirus 2 by RT PCR: NEGATIVE

## 2023-03-18 LAB — ETHANOL: Alcohol, Ethyl (B): 323 mg/dL (ref <10)

## 2023-03-18 LAB — MAGNESIUM: Magnesium: 1.9 mg/dL (ref 1.7–2.4)

## 2023-03-18 MED ORDER — LORAZEPAM 0.5 MG PO TABS: 0.5000 mg | ORAL_TABLET | ORAL | Status: DC | PRN

## 2023-03-18 MED ORDER — LACTATED RINGERS IV BOLUS
1000.0000 mL | Freq: Once | INTRAVENOUS | Status: AC
  Administered 2023-03-18: 1000 mL via INTRAVENOUS

## 2023-03-18 MED ORDER — AZITHROMYCIN 500 MG PO TABS: 500.0000 mg | ORAL_TABLET | Freq: Every day | ORAL | Status: DC

## 2023-03-18 MED ORDER — ENOXAPARIN SODIUM 40 MG/0.4ML IJ SOSY: 40.0000 mg | PREFILLED_SYRINGE | INTRAMUSCULAR | Status: DC

## 2023-03-18 MED ORDER — SODIUM CHLORIDE 0.9 % IV SOLN: 500.0000 mg | INTRAVENOUS | Status: DC

## 2023-03-18 MED ORDER — ADULT MULTIVITAMIN W/MINERALS CH: 1.0000 | ORAL_TABLET | Freq: Every day | ORAL | Status: DC

## 2023-03-18 MED ORDER — PREDNISONE 20 MG PO TABS: 40.0000 mg | ORAL_TABLET | Freq: Every day | ORAL | Status: DC

## 2023-03-18 MED ORDER — ENOXAPARIN SODIUM 60 MG/0.6ML IJ SOSY: 0.5000 mg/kg | PREFILLED_SYRINGE | INTRAMUSCULAR | Status: DC

## 2023-03-18 MED ORDER — SODIUM CHLORIDE 0.9 % IV SOLN: INTRAVENOUS | Status: DC

## 2023-03-18 MED ORDER — FOLIC ACID 1 MG PO TABS: 1.0000 mg | ORAL_TABLET | Freq: Every day | ORAL | Status: DC

## 2023-03-18 MED ORDER — NICOTINE 14 MG/24HR TD PT24
14.0000 mg | MEDICATED_PATCH | Freq: Every day | TRANSDERMAL | Status: DC
  Administered 2023-03-18: 14 mg via TRANSDERMAL
  Filled 2023-03-18: qty 1

## 2023-03-18 MED ORDER — THIAMINE HCL 100 MG/ML IJ SOLN: 100.0000 mg | Freq: Every day | INTRAMUSCULAR | Status: DC

## 2023-03-18 MED ORDER — THIAMINE MONONITRATE 100 MG PO TABS: 100.0000 mg | ORAL_TABLET | Freq: Every day | ORAL | Status: DC

## 2023-03-18 MED ORDER — METHYLPREDNISOLONE SODIUM SUCC 125 MG IJ SOLR: 125.0000 mg | INTRAMUSCULAR | Status: DC

## 2023-03-18 MED ORDER — POTASSIUM CHLORIDE CRYS ER 20 MEQ PO TBCR
40.0000 meq | EXTENDED_RELEASE_TABLET | Freq: Once | ORAL | Status: AC
  Administered 2023-03-18: 40 meq via ORAL
  Filled 2023-03-18: qty 2

## 2023-03-18 MED ORDER — LORAZEPAM 1 MG PO TABS: 1.0000 mg | ORAL_TABLET | ORAL | Status: DC | PRN

## 2023-03-18 NOTE — ED Notes (Addendum)
Per Dr Alvester Morin, pt leaving AMA. Pt ambulatory. Pt has ride home

## 2023-03-18 NOTE — Progress Notes (Signed)
PHARMACIST - PHYSICIAN COMMUNICATION  CONCERNING:  Enoxaparin (Lovenox) for DVT Prophylaxis    RECOMMENDATION: Patient was prescribed enoxaprin 40mg  q24 hours for VTE prophylaxis.   Filed Weights   03/18/23 0151  Weight: 113.4 kg (250 lb)    Body mass index is 32.1 kg/m.  Estimated Creatinine Clearance: 49.3 mL/min (A) (by C-G formula based on SCr of 2.27 mg/dL (H)).   Based on [facility] policy patient is candidate for enoxaparin 0.5mg /kg TBW SQ every 24 hours based on BMI being >30.  DESCRIPTION: Pharmacy has adjusted enoxaparin dose per [facility] policy.  Patient is now receiving enoxaparin 57.5 mg every 24 hours   Gardner Candle, PharmD, BCPS Clinical Pharmacist 03/18/2023 8:24 AM

## 2023-03-18 NOTE — ED Notes (Signed)
Pt given sandwhich box and sprite. Pt given call light and TV remote, denies other needs at this time

## 2023-03-18 NOTE — ED Provider Notes (Signed)
[facility] Provider Note    Event Date/Time   First MD Initiated Contact with Patient 03/18/23 [phone]     (approximate)   History   Chief Complaint Shortness of Breath  HPI  James Lambert is a 57 y.o. male with past medical history of hypertension, stroke, COPD, and alcohol abuse who presents to the ED for shortness of breath.  Patient reports that he has been feeling short of breath with a nonproductive cough for about the past week.  He denies any associated fevers or chills, has not had any pain in his chest or swelling in his legs.  He is concerned it is related to a new medication that he was started on a couple of weeks ago, but he is not sure of the name of this medication.  Reviewing his chart, it appears he was started on oleisartan for uncontrolled hypertension.  He admits to alcohol consumption today, states that he drinks on a daily basis.  He denies any drug use beyond marijuana.     Physical Exam   Triage Vital Signs: ED Triage Vitals [03/18/23 0151]  Enc Vitals Group     BP 116/81     Pulse Rate 83     Resp 18     Temp 98.2 F (36.8 C)     Temp src      SpO2 93 %     Weight 250 lb (113.4 kg)     Height 6\' 2"  (1.88 m)     Head Circumference      Peak Flow      Pain Score 10     Pain Loc      Pain Edu?      Excl. in GC?     Most recent vital signs: Vitals:   03/18/23 0500 03/18/23 0530  BP: 120/84 105/65  Pulse: 71 74  Resp:    Temp:    SpO2:  93%    Constitutional: Alert and oriented, intoxicated appearing. Eyes: Conjunctivae are normal. Head: Atraumatic. Nose: No congestion/rhinnorhea. Mouth/Throat: Mucous membranes are moist.  Cardiovascular: Normal rate, regular rhythm. Grossly normal heart sounds.  2+ radial pulses bilaterally. Respiratory: Normal respiratory effort.  No retractions. Lungs CTAB. Gastrointestinal: Soft and nontender. No distention. Musculoskeletal: No lower extremity tenderness nor edema.   Neurologic:  Normal speech and language. No gross focal neurologic deficits are appreciated.    ED Results / Procedures / Treatments   Labs (all labs ordered are listed, but only abnormal results are displayed) Labs Reviewed  ETHANOL - Abnormal; Notable for the following components:      Result Value   Alcohol, Ethyl (B) 323 (*)    All other components within normal limits  CBC WITH DIFFERENTIAL/PLATELET - Abnormal; Notable for the following components:   Hemoglobin 12.7 (*)    RDW 16.5 (*)    Abs Immature Granulocytes 0.14 (*)    All other components within normal limits  BASIC METABOLIC PANEL - Abnormal; Notable for the following components:   Sodium 134 (*)    Potassium 3.2 (*)    Glucose, Bld 124 (*)    Creatinine, Ser 2.18 (*)    Calcium 8.5 (*)    GFR, Estimated 35 (*)    All other components within normal limits  BASIC METABOLIC PANEL - Abnormal; Notable for the following components:   Sodium 133 (*)    Potassium 3.0 (*)    Glucose, Bld 135 (*)    Creatinine, Ser 2.27 (*)  Calcium 7.9 (*)    GFR, Estimated 33 (*)    All other components within normal limits  RESP PANEL BY RT-PCR (RSV, FLU A&B, COVID)  [id number]  HEPATIC FUNCTION PANEL  MAGNESIUM  TROPONIN I (HIGH SENSITIVITY)     EKG  ED ECG REPORT I, Chesley Noon, the attending physician, personally viewed and interpreted this ECG.   Date: 03/18/2023  EKG Time: 3:59  Rate: 70  Rhythm: normal sinus rhythm  Axis: Normal  Intervals:none  ST&T Change: None  RADIOLOGY Chest x-ray reviewed and interpreted by me with no infiltrate, edema, or effusion.  PROCEDURES:  Critical Care performed: No  Procedures   MEDICATIONS ORDERED IN ED: Medications  lactated ringers bolus 1,000 mL (0 mLs Intravenous Stopped 03/18/23 0614)  potassium chloride SA (KLOR-CON M) CR tablet 40 mEq (40 mEq Oral Given 03/18/23 0535)     IMPRESSION / MDM / ASSESSMENT AND PLAN / ED COURSE  I reviewed the triage vital signs  and the nursing notes.                              57 y.o. male with past medical history of hypertension, stroke, COPD, and alcohol abuse who presents to the ED complaining of increasing difficulty breathing and cough over the past week.  Patient's presentation is most consistent with acute presentation with potential threat to life or bodily function.  Differential diagnosis includes, but is not limited to, ACS, PE, pneumonia, CHF, COPD exacerbation, COVID-19, anemia, electrolyte abnormality, AKI.  Patient intoxicated appearing but otherwise well with reassuring vital signs.  He is not in any respiratory distress and maintaining oxygen saturations at 93% on room air.  Lungs are clear to auscultation bilaterally and he has no focal neurologic deficits on exam.  Initial labs show no significant anemia, leukocytosis, or electrolyte abnormality, but patient does have significant AKI.  This could be related to poor oral intake given his regular alcohol consumption, would also consider medication effect with new diuretic medication.  We will further assess with EKG and chest x-ray, add on troponin and magnesium levels.  EKG shows no evidence of arrhythmia or ischemia, troponin within normal limits.  Chest x-ray is also unremarkable, magnesium level reassuring.  Patient's renal function worsening despite IV fluid hydration, case discussed with hospitalist for admission.      FINAL CLINICAL IMPRESSION(S) / ED DIAGNOSES   Final diagnoses:  Alcoholic intoxication without complication  AKI (acute kidney injury)     Rx / DC Orders   ED Discharge Orders     None        Note:  This document was prepared using Dragon voice recognition software and may include unintentional dictation errors.   Chesley Noon, MD 03/18/23 [phone]

## 2023-03-18 NOTE — Consult Note (Signed)
Initial Consultation Note   Patient: James Lambert ZOX:[id number] DOB: [birth year] PCP: James Skiffannady, Jolene T, NP DOA: 03/18/2023 DOS: the patient was seen and examined on 03/18/2023 Primary service: No att. providers found  Referring physician: Erma Lambert  Reason for consult: COPD, AKI, ETOH   Assessment/Plan: Assessment and Plan:  Initial plan was for patient to be admitted for evaluation of COPD exacerbation, AKI as well as hypokalemia and alcohol abuse.  Orders were placed including COPD treatment protocol as well as CIWA protocol.  However, patient decided leave the hospital AGAINST MEDICAL ADVICE.  Patient states that he has to take care of his disabled brother.  I discussed the importance of admission especially with active acute alcohol toxicity, and renal function.  Discussed risk of leaving including worsening functioning and potential fatality.  Also discussed risk of driving with acute alcoholic intoxication.  Patient states he is not driving, he has a ride that he will be using.  I again stressed the importance of not driving as well as admission.  Patient again discussed importance of taking care of his brother as well as having a ride.  Nursing had patient signed AMA paperwork prior to discharge.     TRH will sign off at present, please call us again when needed.  HPI: James Lambert is a 57 y.o. male with past medical history of alcohol abuse, COPD, CVA, GERD, hypertension, marijuana use who presented with shortness of breath, AKI, alcohol abuse.  Patient reports increased work of breathing for the past month or so.  Positive cough wheezing and sputum production.  1/2 pack/day smoker.  Also heavy alcohol use including 240 ounce beers daily as well as 1/5 of liquor daily.  Denies any other illicit drug use.  No chest pain.  No nausea or vomiting.  Cough has been fairly chronic.  No fevers or chills.  Has been using home inhalers with minimal improvement in symptoms.  No diarrhea.  Noted  baseline CVA with chronic bilateral upper extremity weakness. Presented to the ER afebrile, hemodynamically stable.  No hypoxia.  Chest x-ray grossly stable.  White count 8.6, hemoglobin 13, platelets 322, creatinine 2.3, potassium 3, COVID flu and RSV negative.  Alcohol level of 323.  Review of Systems: As mentioned in the history of present illness. All other systems reviewed and are negative. Past Medical History:  Diagnosis Date   Alcohol abuse    Anemia    Aortic atherosclerosis (HCC)    Asthma    COPD (chronic obstructive pulmonary disease) (HCC)    CVA (cerebral vascular accident) (HCC) 05/10/2022   right side weakness   GERD (gastroesophageal reflux disease)    Hemiparesis affecting right side as late effect of stroke (HCC)    Hidradenitis suppurativa    diagnosed in [facility] ED based on history and physical exam   HLD (hyperlipidemia)    Hypertension    Marijuana use, continuous    Seizure cerebral (HCC)    Sleep apnea    does not use cpap   Tobacco use    Past Surgical History:  Procedure Laterality Date   INCISION AND DRAINAGE ABSCESS Bilateral 11/13/2022   Procedure: INCISION AND DRAINAGE ABSCESS bilateral buttocks & left groin;  Surgeon: Henrene DodgePiscoya, Jose, MD;  Location: [facility] ORS;  Service: General;  Laterality: Bilateral;   IR CT HEAD LTD  05/10/2022   IR FLUORO GUIDED NEEDLE PLC ASPIRATION/INJECTION LOC  03/20/2022   IR PERCUTANEOUS ART THROMBECTOMY/INFUSION INTRACRANIAL INC DIAG ANGIO  05/10/2022   IR US GUIDE  VASC ACCESS RIGHT  05/10/2022   RADIOLOGY WITH ANESTHESIA N/A 05/10/2022   Procedure: IR WITH ANESTHESIA;  Surgeon: Julieanne Cotton, MD;  Location: [facility] OR;  Service: Radiology;  Laterality: N/A;   TEE WITHOUT CARDIOVERSION N/A 03/11/2022   Procedure: TRANSESOPHAGEAL ECHOCARDIOGRAM (TEE);  Surgeon: Lamar Blinks, MD;  Location: [facility] ORS;  Service: Cardiovascular;  Laterality: N/A;   Social History:  reports that he has been smoking cigarettes. He has a 3.75 pack-year  smoking history. He has been exposed to tobacco smoke. He has never used smokeless tobacco. He reports current alcohol use. He reports current drug use. Frequency: 14.00 times per week. Drug: Marijuana.  No Known Allergies  Family History  Problem Relation Age of Onset   Diabetes Mother    Alzheimer's disease Mother    Hypertension Father    Bone cancer Father    Diabetes Brother    Hypertension Brother    Other Maternal Grandmother        unknown medical history   Other Maternal Grandfather        unknown medical history   Other Paternal Grandmother        unknown medical history   Other Paternal Grandfather        unknown medical history    Prior to Admission medications   Medication Sig Start Date End Date Taking? Authorizing Provider  acetaminophen (TYLENOL) 325 MG tablet Take 2 tablets (650 mg total) by mouth every 6 (six) hours as needed for mild pain. 09/10/22   Cannady, Corrie Dandy T, NP  albuterol (VENTOLIN HFA) 108 (90 Base) MCG/ACT inhaler Inhale 1-2 puffs into the lungs every 6 (six) hours as needed for wheezing or shortness of breath. 06/24/22   Iloabachie, Chioma E, NP  amLODipine (NORVASC) 10 MG tablet Take 1 tablet (10 mg total) by mouth once daily. 02/13/23   Cannady, Corrie Dandy T, NP  amLODipine (NORVASC) 10 MG tablet Take 1 tablet (10 mg total) by mouth daily. 02/13/23   Cannady, Corrie Dandy T, NP  atorvastatin (LIPITOR) 80 MG tablet Take 1 tablet (80 mg total) by mouth once daily. Patient taking differently: Take 80 mg by mouth every morning. 09/10/22   Cannady, Corrie Dandy T, NP  atorvastatin (LIPITOR) 80 MG tablet Take 1 tablet (80 mg total) by mouth daily. 09/11/22   Cannady, Corrie Dandy T, NP  clopidogrel (PLAVIX) 75 MG tablet Take 1 tablet (75 mg total) by mouth daily. 09/10/22   Cannady, Corrie Dandy T, NP  clopidogrel (PLAVIX) 75 MG tablet Take 1 tablet (75 mg total) by mouth daily. 09/11/22   Cannady, Corrie Dandy T, NP  metoprolol succinate (TOPROL-XL) 50 MG 24 hr tablet Take 1 tablet (50 mg total)  by mouth daily. Take with or immediately following a meal. 02/13/23   Cannady, Corrie Dandy T, NP  metoprolol succinate (TOPROL-XL) 50 MG 24 hr tablet Take 1 tablet (50 mg total) by mouth daily with or immediately following a meal. 02/13/23   Cannady, Corrie Dandy T, NP  Multiple Vitamin (MULTIVITAMIN WITH MINERALS) TABS tablet Take 1 tablet by mouth daily. 05/27/22   Setzer, Lynnell Jude, PA-C  olmesartan (BENICAR) 5 MG tablet Take 2 tablets (10 mg total) by mouth daily. Patient not taking: Reported on 03/09/2023 02/13/23   Aura Dials T, NP  olmesartan (BENICAR) 5 MG tablet Take 2 tablets (10 mg total) by mouth daily. 02/13/23   Cannady, Corrie Dandy T, NP  pantoprazole (PROTONIX) 40 MG tablet Take 1 tablet (40 mg total) by mouth at bedtime. Patient taking differently: Take 40 mg  by mouth every evening. 09/10/22   Cannady, Corrie Dandy T, NP  pantoprazole (PROTONIX) 40 MG tablet Take 1 tablet (40 mg total) by mouth at bedtime. 09/11/22   Cannady, Corrie Dandy T, NP  thiamine (VITAMIN B-1) 100 MG tablet Take 1 tablet (100 mg total) by mouth daily. 09/10/22   Aura Dials T, NP    Physical Exam: Vitals:   03/18/23 0600 03/18/23 0700 03/18/23 0800 03/18/23 0808  BP: 135/88 111/64 109/80   Pulse: 79 63 77   Resp:      Temp:    99 F (37.2 C)  SpO2: 91% 97% 93%   Weight:      Height:       Physical Exam Constitutional:      General: He is not in acute distress.    Appearance: He is normal weight.  HENT:     Head: Normocephalic and atraumatic.     Mouth/Throat:     Mouth: Mucous membranes are moist.  Cardiovascular:     Rate and Rhythm: Normal rate and regular rhythm.  Pulmonary:     Effort: Pulmonary effort is normal.     Breath sounds: Wheezing present.  Abdominal:     General: Bowel sounds are normal.  Musculoskeletal:        General: Normal range of motion.  Skin:    General: Skin is warm.  Neurological:     General: No focal deficit present.  Psychiatric:        Mood and Affect: Mood normal.     Data  Reviewed:   There are no new results to review at this time.   DG Chest 2 View CLINICAL DATA:  Shortness of breath  EXAM: CHEST - 2 VIEW  COMPARISON:  06/09/2022  FINDINGS: The heart size and mediastinal contours are within normal limits. Both lungs are clear. The visualized skeletal structures are unremarkable.  IMPRESSION: Normal study.  Electronically Signed   By: Charlett Nose M.D.   On: 03/18/2023 03:49  Lab Results  Component Value Date   WBC 8.6 03/18/2023   HGB 12.7 (L) 03/18/2023   HCT 39.5 03/18/2023   MCV 87.0 03/18/2023   PLT 322 03/18/2023   Last metabolic panel Lab Results  Component Value Date   GLUCOSE 135 (H) 03/18/2023   NA 133 (L) 03/18/2023   K 3.0 (L) 03/18/2023   CL 98 03/18/2023   CO2 23 03/18/2023   BUN 10 03/18/2023   CREATININE 2.27 (H) 03/18/2023   GFRNONAA 33 (L) 03/18/2023   CALCIUM 7.9 (L) 03/18/2023   PHOS 2.5 05/11/2022   PROT 8.1 03/18/2023   ALBUMIN 3.6 03/18/2023   LABGLOB 3.7 12/22/2022   AGRATIO 1.1 (L) 12/22/2022   BILITOT 0.4 03/18/2023   ALKPHOS 102 03/18/2023   AST 23 03/18/2023   ALT 13 03/18/2023   ANIONGAP 12 03/18/2023    Family Communication: No family at the bedside  Thank you very much for involving [place] in the care of your patient.  Author: Floydene Flock, MD 03/18/2023 8:42 AM  For on call review [email or web address].

## 2023-03-18 NOTE — ED Triage Notes (Signed)
Pt to ED via EMS from home, pt states that he doesn't think his blood pressure medication is working because he "doesn't feel right" pt states he feels "woozy". Pt states he drank some liquor tonight and smoked weed.

## 2023-03-18 NOTE — ED Notes (Signed)
Pt out of room, stating he is about to leave. RN informed pt that Dr Alvester Morin is on the way to speak with pt.

## 2023-03-18 NOTE — ED Notes (Addendum)
RN to room to answer bed alarm. Pt took off monitors. Took out IV, blood on floor. Pt states he is leaving to go take care of his brother and his ride is on the way. Dr. Alvester Morin notified.  Pt unable to be redirected

## 2023-03-19 ENCOUNTER — Telehealth: Payer: Self-pay

## 2023-03-19 NOTE — Transitions of Care (Post Inpatient/ED Visit) (Signed)
   03/19/2023  Name: James Lambert MRN: [id number] DOB: 1966/10/07  Today's TOC FU Call Status: Today's TOC FU Call Status:: Successful TOC FU Call Competed TOC FU Call Complete Date: 03/19/23  Transition Care Management Follow-up Telephone Call Date of Discharge: 03/18/23 Discharge Facility: [facility]) Type of Discharge: Inpatient Admission Primary Inpatient Discharge Diagnosis:: alcohol use How have you been since you were released from the hospital?: Better Any questions or concerns?: No  Items Reviewed: Did you receive and understand the discharge instructions provided?: Yes Medications obtained and verified?: Yes (Medications Reviewed) Any new allergies since your discharge?: No Dietary orders reviewed?: Yes Do you have support at home?: Yes People in Home: friend(s)  Home Care and Equipment/Supplies: Were Home Health Services Ordered?: NA Any new equipment or medical supplies ordered?: NA  Functional Questionnaire: Do you need assistance with bathing/showering or dressing?: No Do you need assistance with meal preparation?: No Do you need assistance with eating?: No Do you have difficulty maintaining continence: No Do you need assistance with getting out of bed/getting out of a chair/moving?: No Do you have difficulty managing or taking your medications?: No  Follow up appointments reviewed: PCP Follow-up appointment confirmed?: Yes Date of PCP follow-up appointment?: 03/27/23 Follow-up Provider: [facility] Follow-up appointment confirmed?: Yes Date of Specialist follow-up appointment?: 03/20/23 Follow-Up Specialty Provider:: Pulmo Do you need transportation to your follow-up appointment?: No Transportation Need Intervention Addressed By:: AMB Referral For SDOH Needs Do you understand care options if your condition(s) worsen?: Yes-patient verbalized understanding    SIGNATURE Karena Addison, LPN [facility] Nurse  Health Advisor Direct Dial [phone]

## 2023-03-19 NOTE — Telephone Encounter (Signed)
Noted  

## 2023-03-20 ENCOUNTER — Encounter: Payer: Self-pay | Admitting: Physician Assistant

## 2023-03-20 ENCOUNTER — Ambulatory Visit (INDEPENDENT_AMBULATORY_CARE_PROVIDER_SITE_OTHER): Payer: Medicaid Other | Admitting: Physician Assistant

## 2023-03-20 DIAGNOSIS — F17210 Nicotine dependence, cigarettes, uncomplicated: Secondary | ICD-10-CM | POA: Diagnosis not present

## 2023-03-20 NOTE — Progress Notes (Signed)
Virtual Visit via Telephone Note  I connected with James Lambert on 03/20/23 at 10:00 AM EDT by telephone and verified that I am speaking with the correct person using two identifiers.  Location: Patient: home Provider: working virtually from home   I discussed the limitations, risks, security and privacy concerns of performing an evaluation and management service by telephone and the availability of in person appointments. I also discussed with the patient that there may be a patient responsible charge related to this service. The patient expressed understanding and agreed to proceed.       Shared Decision Making Visit Lung Cancer Screening Program [phone])   Eligibility: Age 57 y.o. Pack Years Smoking History Calculation 34 (# packs/per year x # years smoked) Recent History of coughing up blood  no Unexplained weight loss? no ( >Than 15 pounds within the last 6 months ) Prior History Lung / other cancer no (Diagnosis within the last 5 years already requiring surveillance chest CT Scans). Smoking Status Current Smoker   Visit Components: Discussion included one or more decision making aids. yes Discussion included risk/benefits of screening. yes Discussion included potential follow up diagnostic testing for abnormal scans. yes Discussion included meaning and risk of over diagnosis. yes Discussion included meaning and risk of False Positives. yes Discussion included meaning of total radiation exposure. yes  Counseling Included: Importance of adherence to annual lung cancer LDCT screening. yes Impact of comorbidities on ability to participate in the program. yes Ability and willingness to under diagnostic treatment. yes  Smoking Cessation Counseling: Current Smokers:  Discussed importance of smoking cessation. yes Information about tobacco cessation classes and interventions provided to patient. yes Patient provided with "ticket" for LDCT Scan. N/a Symptomatic Patient.  no Diagnosis Code: Tobacco Use Z72.0 Asymptomatic Patient yes  Counseling (Intermediate counseling: > three minutes counseling) B8466 Diagnosis Code: Personal History of Nicotine Dependence. Z99.357 Information about tobacco cessation classes and interventions provided to patient. Yes Written Order for Lung Cancer Screening with LDCT placed in Epic. Yes (CT Chest Lung Cancer Screening Low Dose W/O CM) [id number] Z12.2-Screening of respiratory organs Z87.891-Personal history of nicotine dependence    I have spent 25 minutes of face to face/ virtual visit   time with the patient discussing the risks and benefits of lung cancer screening. We viewed / discussed a power point together that explained in detail the above noted topics. We paused at intervals to allow for questions to be asked and answered to ensure understanding.We discussed that the single most powerful action that he can take to decrease his risk of developing lung cancer is to quit smoking. We discussed whether or not he is ready to commit to setting a quit date. We discussed options for tools to aid in quitting smoking including nicotine replacement therapy, non-nicotine medications, support groups, Quit Smart classes, and behavior modification. We discussed that often times setting smaller, more achievable goals, such as eliminating 1 cigarette a day for a week and then 2 cigarettes a day for a week can be helpful in slowly decreasing the number of cigarettes smoked. This allows for a sense of accomplishment as well as providing a clinical benefit. I provided  him  with smoking cessation  information  with contact information for community resources, classes, free nicotine replacement therapy, and access to mobile apps, text messaging, and on-line smoking cessation help. I have also provided  him  the office contact information in the event he needs to contact me, or the screening staff. We  discussed the time and location of the scan, and  that either Abigail Miyamoto RN, Karlton Lemon, RN  or I will call / send a letter with the results within 24-72 hours of receiving them. The patient verbalized understanding of all of  the above and had no further questions upon leaving the office. They have my contact information in the event they have any further questions.  I spent three minutes counseling on smoking cessation and the health risks of continued tobacco abuse.  I explained to the patient that there has been a high incidence of coronary artery disease noted on these exams. I explained that this is a non-gated exam therefore degree or severity cannot be determined. This patient is on statin therapy. I have asked the patient to follow-up with their PCP regarding any incidental finding of coronary artery disease and management with diet or medication as their PCP  feels is clinically indicated. The patient verbalized understanding of the above and had no further questions upon completion of the visit.     Darcella Gasman Rolland Steinert, PA-C

## 2023-03-20 NOTE — Patient Instructions (Signed)
Thank you for participating in the [facility]. It was our pleasure to meet you today. We will call you with the results of your scan within the next few days. Your scan will be assigned a Lung RADS category score by the physicians reading the scans.  This Lung RADS score determines follow up scanning.  See below for description of categories, and follow up screening recommendations. We will be in touch to schedule your follow up screening annually or based on recommendations of our providers. We will fax a copy of your scan results to your Primary Care Physician, or the physician who referred you to the program, to ensure they have the results. Please call the office if you have any questions or concerns regarding your scanning experience or results.  Our office number is [phone]. Please speak with James Phelps, RN. , or  James Buckner RN, They are  our Lung Cancer Screening RN.'s If They are unavailable when you call, Please leave a message on the voice mail. We will return your call at our earliest convenience.This voice mail is monitored several times a day.  Remember, if your scan is normal, we will scan you annually as long as you continue to meet the criteria for the program. (Age 50-80, Current smoker or smoker who has quit within the last 15 years). If you are a smoker, remember, quitting is the single most powerful action that you can take to decrease your risk of lung cancer and other pulmonary, breathing related problems. We know quitting is hard, and we are here to help.  Please let us know if there is anything we can do to help you meet your goal of quitting. If you are a former smoker, congratulations. We are proud of you! Remain smoke free! Remember you can refer friends or family members through the number above.  We will screen them to make sure they meet criteria for the program. Thank you for helping us take better care of you by  participating in Lung Screening.  You can receive free nicotine replacement therapy ( patches, gum or mints) by calling 1-800-QUIT NOW. Please call so we can get you on the path to becoming  a non-smoker. I know it is hard, but you can do this!  Lung RADS Categories:  Lung RADS 1: no nodules or definitely non-concerning nodules.  Recommendation is for a repeat annual scan in 12 months.  Lung RADS 2:  nodules that are non-concerning in appearance and behavior with a very low likelihood of becoming an active cancer. Recommendation is for a repeat annual scan in 12 months.  Lung RADS 3: nodules that are probably non-concerning , includes nodules with a low likelihood of becoming an active cancer.  Recommendation is for a 6-month repeat screening scan. Often noted after an upper respiratory illness. We will be in touch to make sure you have no questions, and to schedule your 6-month scan.  Lung RADS 4 A: nodules with concerning findings, recommendation is most often for a follow up scan in 3 months or additional testing based on our provider's assessment of the scan. We will be in touch to make sure you have no questions and to schedule the recommended 3 month follow up scan.  Lung RADS 4 B:  indicates findings that are concerning. We will be in touch with you to schedule additional diagnostic testing based on our provider's  assessment of the scan.  Other options for assistance in smoking cessation (   As covered by your insurance benefits)  Hypnosis for smoking cessation  [employer] [phone]  Acupuncture for smoking cessation  [facility] [phone]   

## 2023-03-22 NOTE — Patient Instructions (Signed)
Be Involved in Your Health Care:  Taking Medications When medications are taken as directed, they can greatly improve your health. But if they are not taken as instructed, they may not work. In some cases, not taking them correctly can be harmful. To help ensure your treatment remains effective and safe, understand your medications and how to take them.  Your lab results, notes and after visit summary will be available on My Chart. We strongly encourage you to use this feature. If lab results are abnormal the clinic will contact you with the appropriate steps. If the clinic does not contact you assume the results are satisfactory. You can always see your results on My Chart. If you have questions regarding your condition, please contact the clinic during office hours. You can also ask questions on My Chart.  We at [facility] are grateful that you chose us to provide care. We strive to provide excellent and compassionate care and are always looking for feedback. If you get a survey from the clinic please complete this.   DASH Eating Plan DASH stands for Dietary Approaches to Stop Hypertension. The DASH eating plan is a healthy eating plan that has been shown to: Reduce high blood pressure (hypertension). Reduce your risk for type 2 diabetes, heart disease, and stroke. Help with weight loss. What are tips for following this plan? Reading food labels Check food labels for the amount of salt (sodium) per serving. Choose foods with less than 5 percent of the Daily Value of sodium. Generally, foods with less than 300 milligrams (mg) of sodium per serving fit into this eating plan. To find whole grains, look for the word "whole" as the first word in the ingredient list. Shopping Buy products labeled as "low-sodium" or "no salt added." Buy fresh foods. Avoid canned foods and pre-made or frozen meals. Cooking Avoid adding salt when cooking. Use salt-free seasonings or herbs instead of  table salt or sea salt. Check with your health care provider or pharmacist before using salt substitutes. Do not fry foods. Cook foods using healthy methods such as baking, boiling, grilling, roasting, and broiling instead. Cook with heart-healthy oils, such as olive, canola, avocado, soybean, or sunflower oil. Meal planning  Eat a balanced diet that includes: 4 or more servings of fruits and 4 or more servings of vegetables each day. Try to fill one-half of your plate with fruits and vegetables. 6-8 servings of whole grains each day. Less than 6 oz (170 g) of lean meat, poultry, or fish each day. A 3-oz (85-g) serving of meat is about the same size as a deck of cards. One egg equals 1 oz (28 g). 2-3 servings of low-fat dairy each day. One serving is 1 cup (237 mL). 1 serving of nuts, seeds, or beans 5 times each week. 2-3 servings of heart-healthy fats. Healthy fats called omega-3 fatty acids are found in foods such as walnuts, flaxseeds, fortified milks, and eggs. These fats are also found in cold-water fish, such as sardines, salmon, and mackerel. Limit how much you eat of: Canned or prepackaged foods. Food that is high in trans fat, such as some fried foods. Food that is high in saturated fat, such as fatty meat. Desserts and other sweets, sugary drinks, and other foods with added sugar. Full-fat dairy products. Do not salt foods before eating. Do not eat more than 4 egg yolks a week. Try to eat at least 2 vegetarian meals a week. Eat more home-cooked food and less restaurant,   buffet, and fast food. Lifestyle When eating at a restaurant, ask that your food be prepared with less salt or no salt, if possible. If you drink alcohol: Limit how much you use to: 0-1 drink a day for women who are not pregnant. 0-2 drinks a day for men. Be aware of how much alcohol is in your drink. In the [place] one drink equals one 12 oz bottle of beer (355 mL), one 5 oz glass of wine (148 mL), or one 1 oz  glass of hard liquor (44 mL). General information Avoid eating more than 2,300 mg of salt a day. If you have hypertension, you may need to reduce your sodium intake to 1,500 mg a day. Work with your health care provider to maintain a healthy body weight or to lose weight. Ask what an ideal weight is for you. Get at least 30 minutes of exercise that causes your heart to beat faster (aerobic exercise) most days of the week. Activities may include walking, swimming, or biking. Work with your health care provider or dietitian to adjust your eating plan to your individual calorie needs. What foods should I eat? Fruits All fresh, dried, or frozen fruit. Canned fruit in natural juice (without added sugar). Vegetables Fresh or frozen vegetables (raw, steamed, roasted, or grilled). Low-sodium or reduced-sodium tomato and vegetable juice. Low-sodium or reduced-sodium tomato sauce and tomato paste. Low-sodium or reduced-sodium canned vegetables. Grains Whole-grain or whole-wheat bread. Whole-grain or whole-wheat pasta. Brown rice. Oatmeal. Quinoa. Bulgur. Whole-grain and low-sodium cereals. Pita bread. Low-fat, low-sodium crackers. Whole-wheat flour tortillas. Meats and other proteins Skinless chicken or turkey. Ground chicken or turkey. Pork with fat trimmed off. Fish and seafood. Egg whites. Dried beans, peas, or lentils. Unsalted nuts, nut butters, and seeds. Unsalted canned beans. Lean cuts of beef with fat trimmed off. Low-sodium, lean precooked or cured meat, such as sausages or meat loaves. Dairy Low-fat (1%) or fat-free (skim) milk. Reduced-fat, low-fat, or fat-free cheeses. Nonfat, low-sodium ricotta or cottage cheese. Low-fat or nonfat yogurt. Low-fat, low-sodium cheese. Fats and oils Soft margarine without trans fats. Vegetable oil. Reduced-fat, low-fat, or light mayonnaise and salad dressings (reduced-sodium). Canola, safflower, olive, avocado, soybean, and sunflower oils. Avocado. Seasonings and  condiments Herbs. Spices. Seasoning mixes without salt. Other foods Unsalted popcorn and pretzels. Fat-free sweets. The items listed above may not be a complete list of foods and beverages you can eat. Contact a dietitian for more information. What foods should I avoid? Fruits Canned fruit in a light or heavy syrup. Fried fruit. Fruit in cream or butter sauce. Vegetables Creamed or fried vegetables. Vegetables in a cheese sauce. Regular canned vegetables (not low-sodium or reduced-sodium). Regular canned tomato sauce and paste (not low-sodium or reduced-sodium). Regular tomato and vegetable juice (not low-sodium or reduced-sodium). Pickles. Olives. Grains Baked goods made with fat, such as croissants, muffins, or some breads. Dry pasta or rice meal packs. Meats and other proteins Fatty cuts of meat. Ribs. Fried meat. Bacon. Bologna, salami, and other precooked or cured meats, such as sausages or meat loaves. Fat from the back of a pig (fatback). Bratwurst. Salted nuts and seeds. Canned beans with added salt. Canned or smoked fish. Whole eggs or egg yolks. Chicken or turkey with skin. Dairy Whole or 2% milk, cream, and half-and-half. Whole or full-fat cream cheese. Whole-fat or sweetened yogurt. Full-fat cheese. Nondairy creamers. Whipped toppings. Processed cheese and cheese spreads. Fats and oils Butter. Stick margarine. Lard. Shortening. Ghee. Bacon fat. Tropical oils, such as coconut, palm   kernel, or palm oil. Seasonings and condiments Onion salt, garlic salt, seasoned salt, table salt, and sea salt. Worcestershire sauce. Tartar sauce. Barbecue sauce. Teriyaki sauce. Soy sauce, including reduced-sodium. Steak sauce. Canned and packaged gravies. Fish sauce. Oyster sauce. Cocktail sauce. Store-bought horseradish. Ketchup. Mustard. Meat flavorings and tenderizers. Bouillon cubes. Hot sauces. Pre-made or packaged marinades. Pre-made or packaged taco seasonings. Relishes. Regular salad  dressings. Other foods Salted popcorn and pretzels. The items listed above may not be a complete list of foods and beverages you should avoid. Contact a dietitian for more information. Where to find more information National Heart, Lung, and Blood Institute: www.nhlbi.nih.gov American Heart Association: www.heart.org Academy of Nutrition and Dietetics: www.eatright.org National Kidney Foundation: www.kidney.org Summary The DASH eating plan is a healthy eating plan that has been shown to reduce high blood pressure (hypertension). It may also reduce your risk for type 2 diabetes, heart disease, and stroke. When on the DASH eating plan, aim to eat more fresh fruits and vegetables, whole grains, lean proteins, low-fat dairy, and heart-healthy fats. With the DASH eating plan, you should limit salt (sodium) intake to 2,300 mg a day. If you have hypertension, you may need to reduce your sodium intake to 1,500 mg a day. Work with your health care provider or dietitian to adjust your eating plan to your individual calorie needs. This information is not intended to replace advice given to you by your health care provider. Make sure you discuss any questions you have with your health care provider. Document Revised: 10/28/2019 Document Reviewed: 10/28/2019 Elsevier Patient Education  2023 Elsevier Inc.  

## 2023-03-24 ENCOUNTER — Ambulatory Visit: Admission: RE | Admit: 2023-03-24 | Payer: Medicaid Other | Source: Ambulatory Visit

## 2023-03-27 ENCOUNTER — Ambulatory Visit (INDEPENDENT_AMBULATORY_CARE_PROVIDER_SITE_OTHER): Payer: Medicaid Other | Admitting: Nurse Practitioner

## 2023-03-27 ENCOUNTER — Encounter: Payer: Self-pay | Admitting: Nurse Practitioner

## 2023-03-27 ENCOUNTER — Other Ambulatory Visit: Payer: Self-pay

## 2023-03-27 VITALS — BP 154/98 | HR 66 | Temp 98.4°F | Resp 18 | Ht 74.02 in | Wt 250.0 lb

## 2023-03-27 DIAGNOSIS — Z8673 Personal history of transient ischemic attack (TIA), and cerebral infarction without residual deficits: Secondary | ICD-10-CM

## 2023-03-27 DIAGNOSIS — J449 Chronic obstructive pulmonary disease, unspecified: Secondary | ICD-10-CM

## 2023-03-27 DIAGNOSIS — I69351 Hemiplegia and hemiparesis following cerebral infarction affecting right dominant side: Secondary | ICD-10-CM

## 2023-03-27 DIAGNOSIS — R7309 Other abnormal glucose: Secondary | ICD-10-CM | POA: Diagnosis not present

## 2023-03-27 DIAGNOSIS — I10 Essential (primary) hypertension: Secondary | ICD-10-CM

## 2023-03-27 DIAGNOSIS — F17210 Nicotine dependence, cigarettes, uncomplicated: Secondary | ICD-10-CM

## 2023-03-27 DIAGNOSIS — N179 Acute kidney failure, unspecified: Secondary | ICD-10-CM

## 2023-03-27 MED ORDER — VITAMIN B-1 100 MG PO TABS: 100.0000 mg | ORAL_TABLET | Freq: Every day | ORAL | 4 refills | Status: DC

## 2023-03-27 MED ORDER — OLMESARTAN MEDOXOMIL-HCTZ 20-12.5 MG PO TABS: 1.0000 | ORAL_TABLET | Freq: Every day | ORAL | 4 refills | Status: DC

## 2023-03-27 MED ORDER — AMLODIPINE BESYLATE 10 MG PO TABS: 10.0000 mg | ORAL_TABLET | Freq: Every day | ORAL | 4 refills | Status: DC

## 2023-03-27 NOTE — Assessment & Plan Note (Signed)
A1c mild trend up to 5.9%, monitor closely.  Previous was 5.7%.  Continue diet and exercise focus.  Recheck today.

## 2023-03-27 NOTE — Assessment & Plan Note (Signed)
On recent ER labs after heavier alcohol intake, again continue to recommend working towards complete cessation of alcohol intake.  He refuses AA or medication.  Recheck labs today.

## 2023-03-27 NOTE — Assessment & Plan Note (Signed)
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  Did attend PT for this. May need to return in future if any changes present.  At this time continue current stroke prevention medication regimen and collaboration with neurology. 

## 2023-03-27 NOTE — Progress Notes (Signed)
BP (!) 154/98 (BP Location: Left Arm, Patient Position: Sitting, Cuff Size: Normal)   Pulse 66   Temp 98.4 F (36.9 C) (Oral)   Resp 18   Ht 6' 2.02" (1.88 m)   Wt 250 lb (113.4 kg)   SpO2 98%   BMI 32.08 kg/m    Subjective:    Patient ID: James Lambert, male    DOB: 02-Nov-1966, 57 y.o.   MRN: [id number]  HPI: James Lambert is a 57 y.o. male  Chief Complaint  Patient presents with   Hypertension    Added Olmesartan at last visit   HYPERTENSION  Folow-up for BP -- BP in ER recently 116/81.  Added on Olmesartan 5 MG 02/13/23.  Continues Metoprolol XL 50 MG daily, Amlodipine 10 MG daily.  Currently Amlodipine not in medication bag he brought to office.  Was recently in ER on 03/18/23 for alcohol intoxication and AKI.  Currently reports drinking 2 beers a day, denies any liquor use.  Continues to smoke, smoking about 4 cigarettes a day.  Started smoking in his 20's.  Was a 1 PPD smoker and sometimes more at one point.  History of elevation in A1c at 5.9%. Hypertension status: uncontrolled  Satisfied with current treatment? yes Duration of hypertension: chronic BP monitoring frequency:  not checking BP range:  BP medication side effects:  no Medication compliance: good compliance Previous BP meds: as above Aspirin: no Recurrent headaches: no Visual changes: no Palpitations: no Dyspnea:  occasional with activity Chest pain: no Lower extremity edema:  in knees due to arthritis Dizzy/lightheaded: no   Relevant past medical, surgical, family and social history reviewed and updated as indicated. Interim medical history since our last visit reviewed. Allergies and medications reviewed and updated.  Review of Systems  Constitutional:  Negative for activity change, diaphoresis, fatigue and fever.  Respiratory:  Negative for cough, chest tightness, shortness of breath and wheezing.   Cardiovascular:  Negative for chest pain, palpitations and leg swelling.  Gastrointestinal:  Negative.   Neurological: Negative.   Psychiatric/Behavioral: Negative.     Per HPI unless specifically indicated above     Objective:    BP (!) 154/98 (BP Location: Left Arm, Patient Position: Sitting, Cuff Size: Normal)   Pulse 66   Temp 98.4 F (36.9 C) (Oral)   Resp 18   Ht 6' 2.02" (1.88 m)   Wt 250 lb (113.4 kg)   SpO2 98%   BMI 32.08 kg/m   Wt Readings from Last 3 Encounters:  03/27/23 250 lb (113.4 kg)  03/18/23 250 lb (113.4 kg)  02/13/23 249 lb 11.2 oz (113.3 kg)    Physical Exam Vitals and nursing note reviewed.  Constitutional:      General: He is awake. He is not in acute distress.    Appearance: He is well-developed and well-groomed. He is obese. He is not ill-appearing or toxic-appearing.  HENT:     Head: Normocephalic.     Right Ear: Hearing and external ear normal.     Left Ear: Hearing and external ear normal.  Eyes:     General: Lids are normal.     Extraocular Movements: Extraocular movements intact.     Conjunctiva/sclera: Conjunctivae normal.  Neck:     Thyroid: No thyromegaly.     Vascular: No carotid bruit.  Cardiovascular:     Rate and Rhythm: Normal rate and regular rhythm.     Heart sounds: Normal heart sounds. No murmur heard.    No  gallop.  Pulmonary:     Effort: No accessory muscle usage or respiratory distress.     Breath sounds: Normal breath sounds.  Abdominal:     General: Bowel sounds are normal. There is no distension.     Palpations: Abdomen is soft.     Tenderness: There is no abdominal tenderness.  Musculoskeletal:     Cervical back: Full passive range of motion without pain.     Right lower leg: No edema.     Left lower leg: No edema.  Lymphadenopathy:     Cervical: No cervical adenopathy.  Skin:    General: Skin is warm.     Capillary Refill: Capillary refill takes less than 2 seconds.  Neurological:     Mental Status: He is alert and oriented to person, place, and time.     Deep Tendon Reflexes: Reflexes are  normal and symmetric.     Reflex Scores:      Brachioradialis reflexes are 2+ on the right side and 2+ on the left side.      Patellar reflexes are 2+ on the right side and 2+ on the left side. Psychiatric:        Attention and Perception: Attention normal.        Mood and Affect: Mood normal.        Speech: Speech normal.        Behavior: Behavior normal. Behavior is cooperative.        Thought Content: Thought content normal.    Results for orders placed or performed during the hospital encounter of 03/18/23  Resp panel by RT-PCR (RSV, Flu A&B, Covid) Anterior Nasal Swab   Specimen: Anterior Nasal Swab  Result Value Ref Range   SARS Coronavirus 2 by RT PCR NEGATIVE NEGATIVE   Influenza A by PCR NEGATIVE NEGATIVE   Influenza B by PCR NEGATIVE NEGATIVE   Resp Syncytial Virus by PCR NEGATIVE NEGATIVE  Ethanol  Result Value Ref Range   Alcohol, Ethyl (B) 323 (HH) <10 mg/dL  CBC with Differential  Result Value Ref Range   WBC 8.6 4.0 - 10.5 K/uL   RBC 4.54 4.22 - 5.81 MIL/uL   Hemoglobin 12.7 (L) 13.0 - 17.0 g/dL   HCT 16.1 09.6 - 04.5 %   MCV 87.0 80.0 - 100.0 fL   MCH 28.0 26.0 - 34.0 pg   MCHC 32.2 30.0 - 36.0 g/dL   RDW 40.9 (H) 81.1 - 91.4 %   Platelets 322 150 - 400 K/uL   nRBC 0.2 0.0 - 0.2 %   Neutrophils Relative % 53 %   Neutro Abs 4.8 1.7 - 7.7 K/uL   Lymphocytes Relative 33 %   Lymphs Abs 2.8 0.7 - 4.0 K/uL   Monocytes Relative 9 %   Monocytes Absolute 0.7 0.1 - 1.0 K/uL   Eosinophils Relative 2 %   Eosinophils Absolute 0.2 0.0 - 0.5 K/uL   Basophils Relative 1 %   Basophils Absolute 0.0 0.0 - 0.1 K/uL   Immature Granulocytes 2 %   Abs Immature Granulocytes 0.14 (H) 0.00 - 0.07 K/uL  Basic metabolic panel  Result Value Ref Range   Sodium 134 (L) 135 - 145 mmol/L   Potassium 3.2 (L) 3.5 - 5.1 mmol/L   Chloride 98 98 - 111 mmol/L   CO2 23 22 - 32 mmol/L   Glucose, Bld 124 (H) 70 - 99 mg/dL   BUN 10 6 - 20 mg/dL   Creatinine, Ser 7.82 (H) 0.61 - 1.24  mg/dL    Calcium 8.5 (L) 8.9 - 10.3 mg/dL   GFR, Estimated 35 (L) >60 mL/min   Anion gap 13 5 - 15  Hepatic function panel  Result Value Ref Range   Total Protein 8.1 6.5 - 8.1 g/dL   Albumin 3.6 3.5 - 5.0 g/dL   AST 23 15 - 41 U/L   ALT 13 0 - 44 U/L   Alkaline Phosphatase 102 38 - 126 U/L   Total Bilirubin 0.4 0.3 - 1.2 mg/dL   Bilirubin, Direct <1.6 0.0 - 0.2 mg/dL   Indirect Bilirubin NOT CALCULATED 0.3 - 0.9 mg/dL  Magnesium  Result Value Ref Range   Magnesium 1.9 1.7 - 2.4 mg/dL  Basic metabolic panel  Result Value Ref Range   Sodium 133 (L) 135 - 145 mmol/L   Potassium 3.0 (L) 3.5 - 5.1 mmol/L   Chloride 98 98 - 111 mmol/L   CO2 23 22 - 32 mmol/L   Glucose, Bld 135 (H) 70 - 99 mg/dL   BUN 10 6 - 20 mg/dL   Creatinine, Ser 1.09 (H) 0.61 - 1.24 mg/dL   Calcium 7.9 (L) 8.9 - 10.3 mg/dL   GFR, Estimated 33 (L) >60 mL/min   Anion gap 12 5 - 15  Troponin I (High Sensitivity)  Result Value Ref Range   Troponin I (High Sensitivity) 7 <18 ng/L      Assessment & Plan:   Problem List Items Addressed This Visit       Cardiovascular and Mediastinum   Hypertension - Primary    Chronic, with elevation on initial and recheck.  Currently no Amlodipine in medication bag and does endorse difficulty with costs of medication.  BP stroke prevention goal - <130/80, discussed at length with him his higher risk for subsequent CVA (had had 3).  Discussed at length my concerns for another stroke and need to cut back on alcohol use. - Increase Olmesartan to 20 MG and add HCTZ 12.5 MG with this in combo pill.  Continue Amlodipine 10 MG (sent in refills again) and Metoprolol at this time.   - Recommend he monitor BP at least a few mornings a week at home and document.  DASH diet at home.  - Labs today: CBC and CMP.  Urine ALB 80 January 2024, continue on ARB.   - Work with Managed Medicaid team as concern patient is not taking medication as ordered due to costs, also SW may be beneficial to assist in  alcohol reduction.  Discussed need to reduce alcohol use and smoking.  Reached out to them about possibly get medications transferred back to [facility] outpatient pharmacy to lower his costs, he reports going there in past. - Return in 2 weeks.       Relevant Medications   amLODipine (NORVASC) 10 MG tablet   olmesartan-hydrochlorothiazide (BENICAR HCT) 20-12.5 MG tablet   Other Relevant Orders   Comprehensive metabolic panel   CBC with Differential/Platelet     Nervous and Auditory   Hemiparesis affecting right side as late effect of stroke    Ongoing, mild to right side post CVA.  Did attend PT for this. May need to return in future if any changes present.  At this time continue current stroke prevention medication regimen and collaboration with neurology.        Genitourinary   AKI (acute kidney injury)    On recent ER labs after heavier alcohol intake, again continue to recommend working towards complete cessation of alcohol intake.  He refuses AA or medication.  Recheck labs today.      Relevant Orders   Comprehensive metabolic panel   CBC with Differential/Platelet     Other   Elevated hemoglobin A1c measurement    A1c mild trend up to 5.9%, monitor closely.  Previous was 5.7%.  Continue diet and exercise focus.  Recheck today.      Relevant Orders   HgB A1c   History of CVA (cerebrovascular accident)    Three CVA in 2023.  Continue to collaborate with neurology.  Has family history of CVA, brother.  Recommend continue all current medications and discussed neuro goals: A1c <6.5%, LDL <55, and BP <130/80.  High risk for recurrent CVA, monitor closely and recommend complete cessation of smoking and alcohol use.  Return in 2 weeks.      Nicotine dependence, cigarettes, uncomplicated    I have recommended complete cessation of tobacco use. I have discussed various options available for assistance with tobacco cessation including over the counter methods (Nicotine gum, patch and  lozenges). We also discussed prescription options (Chantix, Nicotine Inhaler / Nasal Spray). The patient is not interested in pursuing any prescription tobacco cessation options at this time.  Referral for lung screening, discussed with patient.  He wishes to hold off.         Follow up plan: Return in about 2 weeks (around 04/10/2023) for HTN -- medication  changes.

## 2023-03-27 NOTE — Assessment & Plan Note (Signed)
Three CVA in 2023.  Continue to collaborate with neurology.  Has family history of CVA, brother.  Recommend continue all current medications and discussed neuro goals: A1c <6.5%, LDL <55, and BP <130/80.  High risk for recurrent CVA, monitor closely and recommend complete cessation of smoking and alcohol use.  Return in 2 weeks.

## 2023-03-27 NOTE — Assessment & Plan Note (Signed)
Chronic, with elevation on initial and recheck.  Currently no Amlodipine in medication bag and does endorse difficulty with costs of medication.  BP stroke prevention goal - <130/80, discussed at length with him his higher risk for subsequent CVA (had had 3).  Discussed at length my concerns for another stroke and need to cut back on alcohol use. - Increase Olmesartan to 20 MG and add HCTZ 12.5 MG with this in combo pill.  Continue Amlodipine 10 MG (sent in refills again) and Metoprolol at this time.   - Recommend he monitor BP at least a few mornings a week at home and document.  DASH diet at home.  - Labs today: CBC and CMP.  Urine ALB 80 January 2024, continue on ARB.   - Work with Managed Medicaid team as concern patient is not taking medication as ordered due to costs, also SW may be beneficial to assist in alcohol reduction.  Discussed need to reduce alcohol use and smoking.  Reached out to them about possibly get medications transferred back to [facility] outpatient pharmacy to lower his costs, he reports going there in past. - Return in 2 weeks.

## 2023-03-27 NOTE — Assessment & Plan Note (Signed)
I have recommended complete cessation of tobacco use. I have discussed various options available for assistance with tobacco cessation including over the counter methods (Nicotine gum, patch and lozenges). We also discussed prescription options (Chantix, Nicotine Inhaler / Nasal Spray). The patient is not interested in pursuing any prescription tobacco cessation options at this time.  Referral for lung screening, discussed with patient.  He wishes to hold off.  

## 2023-03-28 LAB — CBC WITH DIFFERENTIAL/PLATELET
Basophils Absolute: 0 10*3/uL (ref 0.0–0.2)
Basos: 0 %
EOS (ABSOLUTE): 0.2 10*3/uL (ref 0.0–0.4)
Eos: 2 %
Hematocrit: 36.8 % — ABNORMAL LOW (ref 37.5–51.0)
Hemoglobin: 12.1 g/dL — ABNORMAL LOW (ref 13.0–17.7)
Immature Grans (Abs): 0.1 10*3/uL (ref 0.0–0.1)
Immature Granulocytes: 1 %
Lymphocytes Absolute: 2.2 10*3/uL (ref 0.7–3.1)
Lymphs: 33 %
MCH: 28.5 pg (ref 26.6–33.0)
MCHC: 32.9 g/dL (ref 31.5–35.7)
MCV: 87 fL (ref 79–97)
Monocytes Absolute: 0.6 10*3/uL (ref 0.1–0.9)
Monocytes: 9 %
Neutrophils Absolute: 3.7 10*3/uL (ref 1.4–7.0)
Neutrophils: 55 %
Platelets: 310 10*3/uL (ref 150–450)
RBC: 4.24 x10E6/uL (ref 4.14–5.80)
RDW: 15.3 % (ref 11.6–15.4)
WBC: 6.8 10*3/uL (ref 3.4–10.8)

## 2023-03-28 LAB — COMPREHENSIVE METABOLIC PANEL
ALT: 9 IU/L (ref 0–44)
AST: 13 IU/L (ref 0–40)
Albumin/Globulin Ratio: 1.1 — ABNORMAL LOW (ref 1.2–2.2)
Albumin: 4 g/dL (ref 3.8–4.9)
Alkaline Phosphatase: 120 IU/L (ref 44–121)
BUN/Creatinine Ratio: 7 — ABNORMAL LOW (ref 9–20)
BUN: 8 mg/dL (ref 6–24)
Bilirubin Total: 0.4 mg/dL (ref 0.0–1.2)
CO2: 26 mmol/L (ref 20–29)
Calcium: 9.3 mg/dL (ref 8.7–10.2)
Chloride: 101 mmol/L (ref 96–106)
Creatinine, Ser: 1.12 mg/dL (ref 0.76–1.27)
Globulin, Total: 3.8 g/dL (ref 1.5–4.5)
Glucose: 88 mg/dL (ref 70–99)
Potassium: 5 mmol/L (ref 3.5–5.2)
Sodium: 141 mmol/L (ref 134–144)
Total Protein: 7.8 g/dL (ref 6.0–8.5)
eGFR: 78 mL/min/{1.73_m2} (ref >59)

## 2023-03-28 LAB — HEMOGLOBIN A1C
Est. average glucose Bld gHb Est-mCnc: 126 mg/dL
Hgb A1c MFr Bld: 6 % — ABNORMAL HIGH (ref 4.8–5.6)

## 2023-03-29 NOTE — Progress Notes (Signed)
Good morning please let James Lambert know his labs have returned: - Kidney function now stable and in normal range again, continue reduction of alcohol use. - Liver function remains in normal range.   - CBC continues to show some mild anemia, again I recommend reducing alcohol intake and recommend increase vegetable intake of rich, leafy greens like spinach which carry a lot of iron. We will recheck at next visit. - A1c is trending up to 6% now -- if this gets to 6.5% of greater we consider it diabetic level.  Please reduce alcohol and monitor diet, eating healthier food options.  James Lambert the pharmacist said we could send you medications to [facility] pharmacy, do you want me to send them there for you and try to reduce cost? Keep being stellar!!  Thank you for allowing me to participate in your care.  I appreciate you. Kindest regards, James Lambert

## 2023-03-30 ENCOUNTER — Telehealth: Payer: Self-pay | Admitting: Nurse Practitioner

## 2023-03-30 ENCOUNTER — Telehealth: Payer: Self-pay

## 2023-03-30 NOTE — Progress Notes (Signed)
   03/30/2023  Patient ID: James Lambert, male   DOB: [birth year], 57 y.o.   MRN: [id number]  Contacted [facility] to verify receipt of amlodipine  and olmesartan/hctz 20/12.5mg .  They have received the prescriptions and both are going through on patient's Medicaid plan for $4 copays.  Contacted patient to inform him and reiterate importance of picking these up.  He stated he did not have the money to afford copays at this time.  Called [facility] and was told by pharmacy employee they could waive copays if patient requests at pick up.  Called patient again to inform him to request that copays be waived at pickup.  He expressed understanding and plans to get these medications today.    I will contact patient again in 2 weeks to check on supply of other medications and see if he is monitoring BP at home.  Lenna Gilford, PharmD, DPLA

## 2023-03-30 NOTE — Telephone Encounter (Signed)
Pt is calling in because he says that he is missing a few prescriptions that Jolene said he should be taking (Per pt). Pt doesn't know the names of the medication but says Jolene said he was missing some medications. Pt says he received Amlodipine and the Olmesartan but those were the only ones he received. Please follow up with pt.

## 2023-03-31 ENCOUNTER — Telehealth: Payer: Self-pay | Admitting: Nurse Practitioner

## 2023-03-31 NOTE — Telephone Encounter (Signed)
Verified what medications were sent in and that he had all the new medications picked up and also stated the he would need to take these daily

## 2023-03-31 NOTE — Telephone Encounter (Signed)
Patient is calling again to f/u on his blood pressure medication that he says he never got from the pharmacy. Patient did not have the names of them as they were something new he was put on. He is asking if provider would give him a call so he can get his meds as he is scared of having another stroke.

## 2023-04-06 ENCOUNTER — Ambulatory Visit: Payer: Medicaid Other | Attending: Acute Care

## 2023-04-11 NOTE — Patient Instructions (Incomplete)
[facility] - [place], [place] - [address] [phone]   Be Involved in Your Health Care:  Taking Medications When medications are taken as directed, they can greatly improve your health. But if they are not taken as instructed, they may not work. In some cases, not taking them correctly can be harmful. To help ensure your treatment remains effective and safe, understand your medications and how to take them.  Your lab results, notes and after visit summary will be available on My Chart. We strongly encourage you to use this feature. If lab results are abnormal the clinic will contact you with the appropriate steps. If the clinic does not contact you assume the results are satisfactory. You can always see your results on My Chart. If you have questions regarding your condition, please contact the clinic during office hours. You can also ask questions on My Chart.  We at [facility] are grateful that you chose [place] to provide care. We strive to provide excellent and compassionate care and are always looking for feedback. If you get a survey from the clinic please complete this.   DASH Eating Plan DASH stands for Dietary Approaches to Stop Hypertension. The DASH eating plan is a healthy eating plan that has been shown to: Reduce high blood pressure (hypertension). Reduce your risk for type 2 diabetes, heart disease, and stroke. Help with weight loss. What are tips for following this plan? Reading food labels Check food labels for the amount of salt (sodium) per serving. Choose foods with less than 5 percent of the Daily Value of sodium. Generally, foods with less than 300 milligrams (mg) of sodium per serving fit into this eating plan. To find whole grains, look for the word "whole" as the first word in the ingredient list. Shopping Buy products labeled as "low-sodium" or "no salt added." Buy fresh foods. Avoid canned foods and pre-made or frozen meals. Cooking Avoid adding  salt when cooking. Use salt-free seasonings or herbs instead of table salt or sea salt. Check with your health care provider or pharmacist before using salt substitutes. Do not fry foods. Cook foods using healthy methods such as baking, boiling, grilling, roasting, and broiling instead. Cook with heart-healthy oils, such as olive, canola, avocado, soybean, or sunflower oil. Meal planning  Eat a balanced diet that includes: 4 or more servings of fruits and 4 or more servings of vegetables each day. Try to fill one-half of your plate with fruits and vegetables. 6-8 servings of whole grains each day. Less than 6 oz (170 g) of lean meat, poultry, or fish each day. A 3-oz (85-g) serving of meat is about the same size as a deck of cards. One egg equals 1 oz (28 g). 2-3 servings of low-fat dairy each day. One serving is 1 cup (237 mL). 1 serving of nuts, seeds, or beans 5 times each week. 2-3 servings of heart-healthy fats. Healthy fats called omega-3 fatty acids are found in foods such as walnuts, flaxseeds, fortified milks, and eggs. These fats are also found in cold-water fish, such as sardines, salmon, and mackerel. Limit how much you eat of: Canned or prepackaged foods. Food that is high in trans fat, such as some fried foods. Food that is high in saturated fat, such as fatty meat. Desserts and other sweets, sugary drinks, and other foods with added sugar. Full-fat dairy products. Do not salt foods before eating. Do not eat more than 4 egg yolks a week. Try to eat at  least 2 vegetarian meals a week. Eat more home-cooked food and less restaurant, buffet, and fast food. Lifestyle When eating at a restaurant, ask that your food be prepared with less salt or no salt, if possible. If you drink alcohol: Limit how much you use to: 0-1 drink a day for women who are not pregnant. 0-2 drinks a day for men. Be aware of how much alcohol is in your drink. In the [place] one drink equals one 12 oz bottle  of beer (355 mL), one 5 oz glass of wine (148 mL), or one 1 oz glass of hard liquor (44 mL). General information Avoid eating more than 2,300 mg of salt a day. If you have hypertension, you may need to reduce your sodium intake to 1,500 mg a day. Work with your health care provider to maintain a healthy body weight or to lose weight. Ask what an ideal weight is for you. Get at least 30 minutes of exercise that causes your heart to beat faster (aerobic exercise) most days of the week. Activities may include walking, swimming, or biking. Work with your health care provider or dietitian to adjust your eating plan to your individual calorie needs. What foods should I eat? Fruits All fresh, dried, or frozen fruit. Canned fruit in natural juice (without added sugar). Vegetables Fresh or frozen vegetables (raw, steamed, roasted, or grilled). Low-sodium or reduced-sodium tomato and vegetable juice. Low-sodium or reduced-sodium tomato sauce and tomato paste. Low-sodium or reduced-sodium canned vegetables. Grains Whole-grain or whole-wheat bread. Whole-grain or whole-wheat pasta. Brown rice. Orpah Cobb. Bulgur. Whole-grain and low-sodium cereals. Pita bread. Low-fat, low-sodium crackers. Whole-wheat flour tortillas. Meats and other proteins Skinless chicken or [place]. Ground chicken or [place]. Pork with fat trimmed off. Fish and seafood. Egg whites. Dried beans, peas, or lentils. Unsalted nuts, nut butters, and seeds. Unsalted canned beans. Lean cuts of beef with fat trimmed off. Low-sodium, lean precooked or cured meat, such as sausages or meat loaves. Dairy Low-fat (1%) or fat-free (skim) milk. Reduced-fat, low-fat, or fat-free cheeses. Nonfat, low-sodium ricotta or cottage cheese. Low-fat or nonfat yogurt. Low-fat, low-sodium cheese. Fats and oils Soft margarine without trans fats. Vegetable oil. Reduced-fat, low-fat, or light mayonnaise and salad dressings (reduced-sodium). Canola, safflower,  olive, avocado, soybean, and sunflower oils. Avocado. Seasonings and condiments Herbs. Spices. Seasoning mixes without salt. Other foods Unsalted popcorn and pretzels. Fat-free sweets. The items listed above may not be a complete list of foods and beverages you can eat. Contact a dietitian for more information. What foods should I avoid? Fruits Canned fruit in a light or heavy syrup. Fried fruit. Fruit in cream or butter sauce. Vegetables Creamed or fried vegetables. Vegetables in a cheese sauce. Regular canned vegetables (not low-sodium or reduced-sodium). Regular canned tomato sauce and paste (not low-sodium or reduced-sodium). Regular tomato and vegetable juice (not low-sodium or reduced-sodium). Rosita Fire. Olives. Grains Baked goods made with fat, such as croissants, muffins, or some breads. Dry pasta or rice meal packs. Meats and other proteins Fatty cuts of meat. Ribs. Fried meat. Tomasa Blase. Bologna, salami, and other precooked or cured meats, such as sausages or meat loaves. Fat from the back of a pig (fatback). Bratwurst. Salted nuts and seeds. Canned beans with added salt. Canned or smoked fish. Whole eggs or egg yolks. Chicken or [place] with skin. Dairy Whole or 2% milk, cream, and half-and-half. Whole or full-fat cream cheese. Whole-fat or sweetened yogurt. Full-fat cheese. Nondairy creamers. Whipped toppings. Processed cheese and cheese spreads. Fats and oils Butter.  Stick margarine. Lard. Shortening. Ghee. Bacon fat. Tropical oils, such as coconut, palm kernel, or palm oil. Seasonings and condiments Onion salt, garlic salt, seasoned salt, table salt, and sea salt. Worcestershire sauce. Tartar sauce. Barbecue sauce. Teriyaki sauce. Soy sauce, including reduced-sodium. Steak sauce. Canned and packaged gravies. Fish sauce. Oyster sauce. Cocktail sauce. Store-bought horseradish. Ketchup. Mustard. Meat flavorings and tenderizers. Bouillon cubes. Hot sauces. Pre-made or packaged marinades.  Pre-made or packaged taco seasonings. Relishes. Regular salad dressings. Other foods Salted popcorn and pretzels. The items listed above may not be a complete list of foods and beverages you should avoid. Contact a dietitian for more information. Where to find more information National Heart, Lung, and Blood Institute: PopSteam.is American Heart Association: www.heart.org Academy of Nutrition and Dietetics: www.eatright.org National Kidney Foundation: www.kidney.org Summary The DASH eating plan is a healthy eating plan that has been shown to reduce high blood pressure (hypertension). It may also reduce your risk for type 2 diabetes, heart disease, and stroke. When on the DASH eating plan, aim to eat more fresh fruits and vegetables, whole grains, lean proteins, low-fat dairy, and heart-healthy fats. With the DASH eating plan, you should limit salt (sodium) intake to 2,300 mg a day. If you have hypertension, you may need to reduce your sodium intake to 1,500 mg a day. Work with your health care provider or dietitian to adjust your eating plan to your individual calorie needs. This information is not intended to replace advice given to you by your health care provider. Make sure you discuss any questions you have with your health care provider. Document Revised: 10/28/2019 Document Reviewed: 10/28/2019 Elsevier Patient Education  2023 [employer].

## 2023-04-13 ENCOUNTER — Ambulatory Visit: Payer: Medicaid Other | Admitting: Nurse Practitioner

## 2023-04-13 ENCOUNTER — Other Ambulatory Visit: Payer: Medicaid Other

## 2023-04-13 ENCOUNTER — Encounter: Payer: Self-pay | Admitting: Nurse Practitioner

## 2023-04-13 ENCOUNTER — Telehealth: Payer: Self-pay | Admitting: Nurse Practitioner

## 2023-04-13 VITALS — BP 123/89 | HR 84 | Temp 98.4°F | Ht 74.02 in | Wt 239.3 lb

## 2023-04-13 DIAGNOSIS — F17210 Nicotine dependence, cigarettes, uncomplicated: Secondary | ICD-10-CM

## 2023-04-13 DIAGNOSIS — E782 Mixed hyperlipidemia: Secondary | ICD-10-CM

## 2023-04-13 DIAGNOSIS — I10 Essential (primary) hypertension: Secondary | ICD-10-CM

## 2023-04-13 DIAGNOSIS — Z8673 Personal history of transient ischemic attack (TIA), and cerebral infarction without residual deficits: Secondary | ICD-10-CM | POA: Diagnosis not present

## 2023-04-13 DIAGNOSIS — F1010 Alcohol abuse, uncomplicated: Secondary | ICD-10-CM

## 2023-04-13 MED ORDER — CLOPIDOGREL BISULFATE 75 MG PO TABS: 75.0000 mg | ORAL_TABLET | Freq: Every day | ORAL | 4 refills | Status: DC

## 2023-04-13 MED ORDER — AMLODIPINE BESYLATE 10 MG PO TABS: 10.0000 mg | ORAL_TABLET | Freq: Every day | ORAL | 4 refills | Status: DC

## 2023-04-13 MED ORDER — VITAMIN B-1 100 MG PO TABS: 100.0000 mg | ORAL_TABLET | Freq: Every day | ORAL | 4 refills | Status: DC

## 2023-04-13 MED ORDER — PANTOPRAZOLE SODIUM 40 MG PO TBEC: 40.0000 mg | DELAYED_RELEASE_TABLET | Freq: Every day | ORAL | 4 refills | Status: DC

## 2023-04-13 MED ORDER — OLMESARTAN MEDOXOMIL-HCTZ 40-12.5 MG PO TABS: 1.0000 | ORAL_TABLET | Freq: Every day | ORAL | 4 refills | Status: DC

## 2023-04-13 MED ORDER — ATORVASTATIN CALCIUM 80 MG PO TABS: 80.0000 mg | ORAL_TABLET | Freq: Every day | ORAL | 4 refills | Status: DC

## 2023-04-13 MED ORDER — METOPROLOL SUCCINATE ER 50 MG PO TB24: 50.0000 mg | ORAL_TABLET | Freq: Every day | ORAL | 4 refills | Status: DC

## 2023-04-13 NOTE — Assessment & Plan Note (Signed)
I have recommended complete cessation of tobacco use. I have discussed various options available for assistance with tobacco cessation including over the counter methods (Nicotine gum, patch and lozenges). We also discussed prescription options (Chantix, Nicotine Inhaler / Nasal Spray). The patient is not interested in pursuing any prescription tobacco cessation options at this time.  Referral for lung screening, discussed with patient.  He wishes to hold off.  

## 2023-04-13 NOTE — Telephone Encounter (Signed)
Mandi with [facility] states that she was sent over a bunch of prescriptions for the pt and is needing a recent medication list sent to her for the pt.  Please fax [place]: [phone]

## 2023-04-13 NOTE — Progress Notes (Signed)
   04/13/2023  Patient ID: [name], male   DOB: Aug 29, 1966, 57 y.o.   MRN: [id number]  Subjective/Objective: Telephone visit to check on medication access.  Patient answered, but was at [facility] for in-person appointment with [facility]  Medication Adherence/Access/Affordability -[facility] was able to waive copays when he went to pick medications up recently -No affordability concerns at this time, but he did state a "medication was missing" and that Jolene was coming in to discuss with him  Assessment/Plan:  Medication Adherence/Access/Affordability -Will consult with Jolene after patient's appointment to see if/what medication he does not have -Appears that several are due for refills per pharmacy fill report  Follow-Up:  Will work with patient/pharmacy to get needed medications  Lenna Gilford, PharmD, DPLA

## 2023-04-13 NOTE — Progress Notes (Signed)
BP 123/89   Pulse 84   Temp 98.4 F (36.9 C) (Oral)   Ht 6' 2.02" (1.88 m)   Wt 239 lb 4.8 oz (108.5 kg)   SpO2 96%   BMI 30.71 kg/m    Subjective:    Patient ID: James Lambert, male    DOB: 09-17-1966, 57 y.o.   MRN: [id number]  HPI: James Lambert is a 57 y.o. male  Chief Complaint  Patient presents with   Hypertension    Increased Olmesartan and added HCTZ at last visit   HYPERTENSION  Folow-up for BP -- currently taking Amlodipine 10 MG daily, Olmesartan- HCTZ 20-12.5 MG daily, Metoprolol XL 50 MG daily.  Had also been taking Olmesartan 5 MG daily as forgot he was to stop this.  Had not been taking Plavix and Atorvastatin as he reports pharmacy did not give to him.  Is interested in pill packs.  Last ER visit was on 03/18/23 for alcohol intoxication and AKI.  Is cutting back, some days not drinking any beers and other days 1-2 a day.  Continues to smoke, smoking about 4 cigarettes a day.  Started smoking in his 20's.  Was a 1 PPD smoker and sometimes more at one point.  History of elevation in A1c, with recent level 6% on 03/27/23. Hypertension status: improving Satisfied with current treatment? yes Duration of hypertension: chronic BP monitoring frequency:  not checking BP range:  BP medication side effects:  no Medication compliance: good compliance Previous BP meds: as above Aspirin: no Recurrent headaches: no Visual changes: no Palpitations: no Dyspnea:  occasional with activity  -- long walks Chest pain: no Lower extremity edema:  in knees due to arthritis Dizzy/lightheaded: occasional with getting up to quickly  Relevant past medical, surgical, family and social history reviewed and updated as indicated. Interim medical history since our last visit reviewed. Allergies and medications reviewed and updated.  Review of Systems  Constitutional:  Negative for activity change, diaphoresis, fatigue and fever.  Respiratory:  Negative for cough, chest tightness,  shortness of breath and wheezing.   Cardiovascular:  Negative for chest pain, palpitations and leg swelling.  Gastrointestinal: Negative.   Neurological: Negative.   Psychiatric/Behavioral: Negative.     Per HPI unless specifically indicated above     Objective:    BP 123/89   Pulse 84   Temp 98.4 F (36.9 C) (Oral)   Ht 6' 2.02" (1.88 m)   Wt 239 lb 4.8 oz (108.5 kg)   SpO2 96%   BMI 30.71 kg/m   Wt Readings from Last 3 Encounters:  04/13/23 239 lb 4.8 oz (108.5 kg)  03/27/23 250 lb (113.4 kg)  03/18/23 250 lb (113.4 kg)    Physical Exam Vitals and nursing note reviewed.  Constitutional:      General: He is awake. He is not in acute distress.    Appearance: He is well-developed and well-groomed. He is obese. He is not ill-appearing or toxic-appearing.  HENT:     Head: Normocephalic.     Right Ear: Hearing and external ear normal.     Left Ear: Hearing and external ear normal.  Eyes:     General: Lids are normal.     Extraocular Movements: Extraocular movements intact.     Conjunctiva/sclera: Conjunctivae normal.  Neck:     Thyroid: No thyromegaly.     Vascular: No carotid bruit.  Cardiovascular:     Rate and Rhythm: Normal rate and regular rhythm.  Heart sounds: Normal heart sounds. No murmur heard.    No gallop.  Pulmonary:     Effort: No accessory muscle usage or respiratory distress.     Breath sounds: Normal breath sounds.  Abdominal:     General: Bowel sounds are normal. There is no distension.     Palpations: Abdomen is soft.     Tenderness: There is no abdominal tenderness.  Musculoskeletal:     Cervical back: Full passive range of motion without pain.     Right lower leg: No edema.     Left lower leg: No edema.  Lymphadenopathy:     Cervical: No cervical adenopathy.  Skin:    General: Skin is warm.     Capillary Refill: Capillary refill takes less than 2 seconds.  Neurological:     Mental Status: He is alert and oriented to person, place, and  time.     Deep Tendon Reflexes: Reflexes are normal and symmetric.     Reflex Scores:      Brachioradialis reflexes are 2+ on the right side and 2+ on the left side.      Patellar reflexes are 2+ on the right side and 2+ on the left side. Psychiatric:        Attention and Perception: Attention normal.        Mood and Affect: Mood normal.        Speech: Speech normal.        Behavior: Behavior normal. Behavior is cooperative.        Thought Content: Thought content normal.    Results for orders placed or performed in visit on 03/27/23  Comprehensive metabolic panel  Result Value Ref Range   Glucose 88 70 - 99 mg/dL   BUN 8 6 - 24 mg/dL   Creatinine, Ser 0.98 0.76 - 1.27 mg/dL   eGFR 78 >11 BJ/YNW/2.95   BUN/Creatinine Ratio 7 (L) 9 - 20   Sodium 141 134 - 144 mmol/L   Potassium 5.0 3.5 - 5.2 mmol/L   Chloride 101 96 - 106 mmol/L   CO2 26 20 - 29 mmol/L   Calcium 9.3 8.7 - 10.2 mg/dL   Total Protein 7.8 6.0 - 8.5 g/dL   Albumin 4.0 3.8 - 4.9 g/dL   Globulin, Total 3.8 1.5 - 4.5 g/dL   Albumin/Globulin Ratio 1.1 (L) 1.2 - 2.2   Bilirubin Total 0.4 0.0 - 1.2 mg/dL   Alkaline Phosphatase 120 44 - 121 IU/L   AST 13 0 - 40 IU/L   ALT 9 0 - 44 IU/L  CBC with Differential/Platelet  Result Value Ref Range   WBC 6.8 3.4 - 10.8 x10E3/uL   RBC 4.24 4.14 - 5.80 x10E6/uL   Hemoglobin 12.1 (L) 13.0 - 17.7 g/dL   Hematocrit 62.1 (L) 30.8 - 51.0 %   MCV 87 79 - 97 fL   MCH 28.5 26.6 - 33.0 pg   MCHC 32.9 31.5 - 35.7 g/dL   RDW 65.7 84.6 - 96.2 %   Platelets 310 150 - 450 x10E3/uL   Neutrophils 55 Not Estab. %   Lymphs 33 Not Estab. %   Monocytes 9 Not Estab. %   Eos 2 Not Estab. %   Basos 0 Not Estab. %   Neutrophils Absolute 3.7 1.4 - 7.0 x10E3/uL   Lymphocytes Absolute 2.2 0.7 - 3.1 x10E3/uL   Monocytes Absolute 0.6 0.1 - 0.9 x10E3/uL   EOS (ABSOLUTE) 0.2 0.0 - 0.4 x10E3/uL   Basophils Absolute 0.0 0.0 -  0.2 x10E3/uL   Immature Granulocytes 1 Not Estab. %   Immature Grans  (Abs) 0.1 0.0 - 0.1 x10E3/uL  HgB A1c  Result Value Ref Range   Hgb A1c MFr Bld 6.0 (H) 4.8 - 5.6 %   Est. average glucose Bld gHb Est-mCnc 126 mg/dL      Assessment & Plan:   Problem List Items Addressed This Visit       Cardiovascular and Mediastinum   Hypertension - Primary    Chronic, improving levels.  BP stroke prevention goal - <130/80, discussed at length with him his higher risk for subsequent CVA (had had 3).  Discussed at length my concerns for another stroke and need to cut back on alcohol use. - Increase Olmesartan to 40 MG (as was taking total of 25 MG since last visit) and maintain HCTZ 12.5 MG with this in combo pill.  Continue Amlodipine 10 MG and Metoprolol at this time.  Sent in all medications to [facility] for pill packs. - Recommend he monitor BP at least a few mornings a week at home and document.  DASH diet at home.  - Labs today: up  to date.  Urine ALB 80 January 2024, continue on ARB.   - Work with Managed Medicaid team and SW may be beneficial to assist in alcohol reduction.  Discussed need to reduce alcohol use and smoking.  Reached out to them on pill packs. - Discussed with him need to get up slowly when changing positions due to risk for orthostatic BP levels. - Return in 4 weeks.       Relevant Medications   atorvastatin (LIPITOR) 80 MG tablet   metoprolol succinate (TOPROL-XL) 50 MG 24 hr tablet   amLODipine (NORVASC) 10 MG tablet   olmesartan-hydrochlorothiazide (BENICAR HCT) 40-12.5 MG tablet     Other   Alcohol abuse    Chronic, ongoing issue.  He has cut back, however recommend complete cessation of alcohol use to help prevent elevation in BP and stroke.        History of CVA (cerebrovascular accident)    Three CVA in 2023.  Continue to collaborate with neurology.  Has family history of CVA, brother.  Recommend continue all current medications and discussed neuro goals: A1c <6.5%, LDL <55, and BP <130/80.  High risk for recurrent CVA,  monitor closely and recommend complete cessation of smoking and alcohol use.  Return in 4 weeks.      Mixed hyperlipidemia    Chronic, ongoing.  Continue current medication regimen and adjust as needed -- will send for pill packs to ensure he takes this daily.  Lipid panel next visit.       Relevant Medications   atorvastatin (LIPITOR) 80 MG tablet   metoprolol succinate (TOPROL-XL) 50 MG 24 hr tablet   amLODipine (NORVASC) 10 MG tablet   olmesartan-hydrochlorothiazide (BENICAR HCT) 40-12.5 MG tablet   Nicotine dependence, cigarettes, uncomplicated    I have recommended complete cessation of tobacco use. I have discussed various options available for assistance with tobacco cessation including over the counter methods (Nicotine gum, patch and lozenges). We also discussed prescription options (Chantix, Nicotine Inhaler / Nasal Spray). The patient is not interested in pursuing any prescription tobacco cessation options at this time.  Referral for lung screening, discussed with patient.  He wishes to hold off.         Follow up plan: Return in about 4 weeks (around 05/11/2023) for HTN/HLD -- increased Olmesartan to 40 MG.

## 2023-04-13 NOTE — Assessment & Plan Note (Signed)
Chronic, ongoing.  Continue current medication regimen and adjust as needed -- will send for pill packs to ensure he takes this daily.  Lipid panel next visit.

## 2023-04-13 NOTE — Assessment & Plan Note (Signed)
Three CVA in 2023.  Continue to collaborate with neurology.  Has family history of CVA, brother.  Recommend continue all current medications and discussed neuro goals: A1c <6.5%, LDL <55, and BP <130/80.  High risk for recurrent CVA, monitor closely and recommend complete cessation of smoking and alcohol use.  Return in 4 weeks.

## 2023-04-13 NOTE — Assessment & Plan Note (Signed)
Chronic, improving levels.  BP stroke prevention goal - <130/80, discussed at length with him his higher risk for subsequent CVA (had had 3).  Discussed at length my concerns for another stroke and need to cut back on alcohol use. - Increase Olmesartan to 40 MG (as was taking total of 25 MG since last visit) and maintain HCTZ 12.5 MG with this in combo pill.  Continue Amlodipine 10 MG and Metoprolol at this time.  Sent in all medications to [facility] for pill packs. - Recommend he monitor BP at least a few mornings a week at home and document.  DASH diet at home.  - Labs today: up  to date.  Urine ALB 80 January 2024, continue on ARB.   - Work with Managed Medicaid team and SW may be beneficial to assist in alcohol reduction.  Discussed need to reduce alcohol use and smoking.  Reached out to them on pill packs. - Discussed with him need to get up slowly when changing positions due to risk for orthostatic BP levels. - Return in 4 weeks.

## 2023-04-13 NOTE — Assessment & Plan Note (Signed)
Chronic, ongoing issue.  He has cut back, however recommend complete cessation of alcohol use to help prevent elevation in BP and stroke.   

## 2023-04-14 ENCOUNTER — Ambulatory Visit: Payer: Self-pay | Admitting: *Deleted

## 2023-04-14 NOTE — Telephone Encounter (Signed)
Mandy with [facility] is calling in to check on the status of the request for the medication list.

## 2023-04-14 NOTE — Telephone Encounter (Signed)
Reason for Disposition  [1] Longstanding difficulty breathing (e.g., CHF, COPD, emphysema) AND [2] WORSE than normal  Answer Assessment - Initial Assessment Questions 1. RESPIRATORY STATUS: "Describe your breathing?" (e.g., wheezing, shortness of breath, unable to speak, severe coughing)      SOB- when still 2. ONSET: "When did this breathing problem begin?"      yesterday 3. PATTERN "Does the difficult breathing come and go, or has it been constant since it started?"      Comes and goes- patient has been smoking 4. SEVERITY: "How bad is your breathing?" (e.g., mild, moderate, severe)    - MILD: No SOB at rest, mild SOB with walking, speaks normally in sentences, can lie down, no retractions, pulse < 100.    - MODERATE: SOB at rest, SOB with minimal exertion and prefers to sit, cannot lie down flat, speaks in phrases, mild retractions, audible wheezing, pulse 100-120.    - SEVERE: Very SOB at rest, speaks in single words, struggling to breathe, sitting hunched forward, retractions, pulse > 120      Mild/moderate 5. RECURRENT SYMPTOM: "Have you had difficulty breathing before?" If Yes, ask: "When was the last time?" and "What happened that time?"      Asthmatic   7. LUNG HISTORY: "Do you have any history of lung disease?"  (e.g., pulmonary embolus, asthma, emphysema)     asthma 8. CAUSE: "What do you think is causing the breathing problem?"      Having to use inhalers  9. OTHER SYMPTOMS: "Do you have any other symptoms? (e.g., dizziness, runny nose, cough, chest pain, fever)     Dizziness- comes and goes  Protocols used: Breathing Difficulty-A-AH

## 2023-04-14 NOTE — Telephone Encounter (Signed)
  Chief Complaint: trouble breathing- asthma, patient admits smoking at times, increased inhaler use  Symptoms: see above- comes and goes Frequency: chronic problem- getting worse Pertinent Negatives: Patient denies runny nose, cough, chest pain, fever) Disposition: [] ED /[] Urgent Care (no appt availability in office) / [x] Appointment(In office/virtual)/ []  [facility]/ [] Home Care/ [] Refused Recommended Disposition /[]  Mobile Bus/ []  Follow-up with PCP Additional Notes: Offered appointment today- patient declines- he only wants to see PCP. Patient has been scheduled at his convenience- advised ED if he gets worse

## 2023-04-15 ENCOUNTER — Other Ambulatory Visit: Payer: Medicaid Other

## 2023-04-15 ENCOUNTER — Ambulatory Visit: Payer: Medicaid Other | Admitting: Nurse Practitioner

## 2023-04-15 ENCOUNTER — Encounter: Payer: Self-pay | Admitting: Nurse Practitioner

## 2023-04-15 VITALS — BP 127/80 | HR 68 | Temp 98.1°F | Ht 74.02 in | Wt 242.4 lb

## 2023-04-15 DIAGNOSIS — L732 Hidradenitis suppurativa: Secondary | ICD-10-CM | POA: Diagnosis not present

## 2023-04-15 DIAGNOSIS — J439 Emphysema, unspecified: Secondary | ICD-10-CM | POA: Diagnosis not present

## 2023-04-15 MED ORDER — UMECLIDINIUM-VILANTEROL 62.5-25 MCG/ACT IN AEPB: 1.0000 | INHALATION_SPRAY | Freq: Every day | RESPIRATORY_TRACT | 4 refills | Status: DC

## 2023-04-15 MED ORDER — DOXYCYCLINE HYCLATE 100 MG PO TABS: 100.0000 mg | ORAL_TABLET | Freq: Two times a day (BID) | ORAL | 0 refills | Status: AC

## 2023-04-15 NOTE — Progress Notes (Signed)
BP 127/80   Pulse 68   Temp 98.1 F (36.7 C) (Oral)   Ht 6' 2.02" (1.88 m)   Wt 242 lb 6.4 oz (110 kg)   SpO2 98%   BMI 31.11 kg/m    Subjective:    Patient ID: James Lambert, male    DOB: 1966-05-23, 57 y.o.   MRN: [id number]  HPI: James Lambert is a 57 y.o. male  Chief Complaint  Patient presents with   Shortness of Breath   Referral    Patient would like to have a referral for a boil on the top of his buttock and in the groin.   Has boil currently to top of buttock and in the groin area -- has had issues with this most of his life and gone to general surgery to lance. Wishes referral to them.    COPD Continues to smoke, smoking about 4 cigarettes a day. Started smoking in his 20's. Was a 1 PPD smoker and sometimes more at one point.  Was scheduled to have lung cancer screening on 03/20/23, but missed due to being in hospital.    Has had SOB for months.  Notices this more with activity.  Denies orthopnea COPD status: stable Satisfied with current treatment?: yes Oxygen use: no Dyspnea frequency: for many months with activity only -- strenuous Cough frequency: in the morning Rescue inhaler frequency: none Limitation of activity: no Productive cough: none Last Spirometry: unknown Pneumovax: Not up to Date Influenza: Up to Date   Relevant past medical, surgical, family and social history reviewed and updated as indicated. Interim medical history since our last visit reviewed. Allergies and medications reviewed and updated.  Review of Systems  Constitutional:  Negative for activity change, diaphoresis, fatigue and fever.  Respiratory:  Positive for cough, shortness of breath and wheezing. Negative for chest tightness.   Cardiovascular:  Negative for chest pain, palpitations and leg swelling.  Gastrointestinal: Negative.   Psychiatric/Behavioral: Negative.     Per HPI unless specifically indicated above     Objective:    BP 127/80   Pulse 68   Temp 98.1 F  (36.7 C) (Oral)   Ht 6' 2.02" (1.88 m)   Wt 242 lb 6.4 oz (110 kg)   SpO2 98%   BMI 31.11 kg/m   Wt Readings from Last 3 Encounters:  04/15/23 242 lb 6.4 oz (110 kg)  04/13/23 239 lb 4.8 oz (108.5 kg)  03/27/23 250 lb (113.4 kg)    Physical Exam Vitals and nursing note reviewed.  Constitutional:      General: He is awake. He is not in acute distress.    Appearance: He is well-developed and well-groomed. He is obese. He is not ill-appearing or toxic-appearing.  HENT:     Head: Normocephalic.     Right Ear: Hearing and external ear normal.     Left Ear: Hearing and external ear normal.  Eyes:     General: Lids are normal.     Extraocular Movements: Extraocular movements intact.     Conjunctiva/sclera: Conjunctivae normal.  Neck:     Thyroid: No thyromegaly.     Vascular: No carotid bruit.  Cardiovascular:     Rate and Rhythm: Normal rate and regular rhythm.     Heart sounds: Normal heart sounds. No murmur heard.    No gallop.  Pulmonary:     Effort: No accessory muscle usage or respiratory distress.     Breath sounds: Wheezing present. No decreased breath sounds  or rhonchi.     Comments: Expiratory wheezes noted throughout. Abdominal:     General: Bowel sounds are normal. There is no distension.     Palpations: Abdomen is soft.     Tenderness: There is no abdominal tenderness.  Musculoskeletal:     Cervical back: Full passive range of motion without pain.     Right lower leg: No edema.     Left lower leg: No edema.  Lymphadenopathy:     Cervical: No cervical adenopathy.  Skin:    General: Skin is warm.     Capillary Refill: Capillary refill takes less than 2 seconds.     Findings: Abscess present.       Neurological:     Mental Status: He is alert and oriented to person, place, and time.     Deep Tendon Reflexes: Reflexes are normal and symmetric.     Reflex Scores:      Brachioradialis reflexes are 2+ on the right side and 2+ on the left side.      Patellar  reflexes are 2+ on the right side and 2+ on the left side. Psychiatric:        Attention and Perception: Attention normal.        Mood and Affect: Mood normal.        Speech: Speech normal.        Behavior: Behavior normal. Behavior is cooperative.        Thought Content: Thought content normal.    Results for orders placed or performed in visit on 03/27/23  Comprehensive metabolic panel  Result Value Ref Range   Glucose 88 70 - 99 mg/dL   BUN 8 6 - 24 mg/dL   Creatinine, Ser 1.61 0.76 - 1.27 mg/dL   eGFR 78 >09 UE/AVW/0.98   BUN/Creatinine Ratio 7 (L) 9 - 20   Sodium 141 134 - 144 mmol/L   Potassium 5.0 3.5 - 5.2 mmol/L   Chloride 101 96 - 106 mmol/L   CO2 26 20 - 29 mmol/L   Calcium 9.3 8.7 - 10.2 mg/dL   Total Protein 7.8 6.0 - 8.5 g/dL   Albumin 4.0 3.8 - 4.9 g/dL   Globulin, Total 3.8 1.5 - 4.5 g/dL   Albumin/Globulin Ratio 1.1 (L) 1.2 - 2.2   Bilirubin Total 0.4 0.0 - 1.2 mg/dL   Alkaline Phosphatase 120 44 - 121 IU/L   AST 13 0 - 40 IU/L   ALT 9 0 - 44 IU/L  CBC with Differential/Platelet  Result Value Ref Range   WBC 6.8 3.4 - 10.8 x10E3/uL   RBC 4.24 4.14 - 5.80 x10E6/uL   Hemoglobin 12.1 (L) 13.0 - 17.7 g/dL   Hematocrit 11.9 (L) 14.7 - 51.0 %   MCV 87 79 - 97 fL   MCH 28.5 26.6 - 33.0 pg   MCHC 32.9 31.5 - 35.7 g/dL   RDW 82.9 56.2 - 13.0 %   Platelets 310 150 - 450 x10E3/uL   Neutrophils 55 Not Estab. %   Lymphs 33 Not Estab. %   Monocytes 9 Not Estab. %   Eos 2 Not Estab. %   Basos 0 Not Estab. %   Neutrophils Absolute 3.7 1.4 - 7.0 x10E3/uL   Lymphocytes Absolute 2.2 0.7 - 3.1 x10E3/uL   Monocytes Absolute 0.6 0.1 - 0.9 x10E3/uL   EOS (ABSOLUTE) 0.2 0.0 - 0.4 x10E3/uL   Basophils Absolute 0.0 0.0 - 0.2 x10E3/uL   Immature Granulocytes 1 Not Estab. %   Immature  Grans (Abs) 0.1 0.0 - 0.1 x10E3/uL  HgB A1c  Result Value Ref Range   Hgb A1c MFr Bld 6.0 (H) 4.8 - 5.6 %   Est. average glucose Bld gHb Est-mCnc 126 mg/dL      Assessment & Plan:    Problem List Items Addressed This Visit       Respiratory   COPD (chronic obstructive pulmonary disease) (HCC)    Chronic, ongoing.  Has Albuterol inhaler at home.  Would benefit from daily maintenance inhaler, educated him on this and emphysema -- benefit of a LABA/LAMA inhaler.  Will trial Anoro one puff daily, educated him on this medication and use.  He is aware to use daily and then use albuterol only as needed for wheezing or SOB.  Suspect Anoro will offer benefit to his SOB with activity.  Plan to return as schedule in June and will obtain spirometry.      Relevant Medications   umeclidinium-vilanterol (ANORO ELLIPTA) 62.5-25 MCG/ACT AEPB     Musculoskeletal and Integument   Hydradenitis - Primary    Chronic with current acute flares to groin and upper gluteal fold.  Start Doxycyline BID for 7 days and referral to general surgery.  May benefit from addition of Hibiclens wash and referral to dermatology in future.  ?need for Humira due to ongoing flares.      Relevant Orders   Ambulatory referral to General Surgery     Follow up plan: Return for as scheduled in June.

## 2023-04-15 NOTE — Assessment & Plan Note (Signed)
Chronic, ongoing.  Has Albuterol inhaler at home.  Would benefit from daily maintenance inhaler, educated him on this and emphysema -- benefit of a LABA/LAMA inhaler.  Will trial Anoro one puff daily, educated him on this medication and use.  He is aware to use daily and then use albuterol only as needed for wheezing or SOB.  Suspect Anoro will offer benefit to his SOB with activity.  Plan to return as schedule in June and will obtain spirometry.

## 2023-04-15 NOTE — Telephone Encounter (Signed)
Medication list has been faxed to provided number to [facility]

## 2023-04-15 NOTE — Assessment & Plan Note (Signed)
Chronic with current acute flares to groin and upper gluteal fold.  Start Doxycyline BID for 7 days and referral to general surgery.  May benefit from addition of Hibiclens wash and referral to dermatology in future.  ?need for Humira due to ongoing flares.

## 2023-04-15 NOTE — Patient Instructions (Signed)
Living with COPD Being diagnosed with chronic obstructive pulmonary disease (COPD) changes your life physically and emotionally. Having COPD can affect your ability to work and do things you enjoy. COPD is not the same for everyone, and it may change over time. Your health care providers can help you come up with the COPD management plan that works best for you. How to manage lifestyle changes Treatment plan Work closely with your health care providers. Follow your COPD management plan. This plan includes: Instructions about activities, exercises, diet, medicines, what to do when COPD flares up, and when to call your health care provider. A pulmonary rehabilitation program. In pulmonary rehab, you will learn about COPD, do exercises for fitness and breathing, and get support from health care providers and other people who have COPD. Managing emotions and stress Living with a chronic disease means you may also struggle with stressful emotions, such as sadness, fear, and worry. Here are some ways to manage these emotions: Talk to someone about your fear, anxiety, depression, or stress. Learn strategies to avoid or reduce stress and ask for help if you are struggling with depression or anxiety. Consider joining a COPD support group, online or in person.  Adjusting to changes COPD may limit the things you can do, but you can make certain changes to help you cope with the diagnosis. Ask for help when you need it. Getting support from friends, family, and your health care team is an important part of managing the condition. Try to get regular exercise as prescribed by a health care provider or pulmonary rehab team. Exercising can help COPD, even if you are a bit short of breath. Take steps to prevent infection and protect your lungs: Wash your hands often and avoid being in crowds. Stay away from friends and family members who are sick. Check your local air quality each day, and stay out of areas  where air pollution is likely. How to recognize changes in your condition Recognizing changes in your COPD COPD is a progressive disease. It is important to let the health care team know if your COPD is getting worse. Your treatment plan may need to change. Watch for: Increased shortness of breath, wheezing, cough, or fatigue. Loss of ability to exercise or perform daily activities, like climbing stairs. More frequent symptom flares. Signs of depression or anxiety. Recognizing stress It is normal to have additional stress when you have COPD. However, prolonged stress and anxiety can make COPD worse and lead to depression. Recognize the warning signs, which include: Feeling sad or worried more often or most of the time. Having less energy and losing interest in pleasurable activities. Changes in your appetite or sleeping patterns. Being easily angered or irritated. Having unexplained aches and pains, digestive problems, or headaches. Follow these instructions at home: Eating and drinking  Eat foods that are high in fiber, such as fresh fruits and vegetables, whole grains, and beans. Limit foods that are high in fat and processed sugars, such as fried or sweet foods. Follow a balanced diet and maintain a healthy weight. Being overweight or underweight can make COPD worse. You may work with a dietitian as part of your pulmonary rehab program. Drink enough fluid to keep your urine pale yellow. If you drink alcohol: Limit how much you have to: 0-1 drink a day for women who are not pregnant. 0-2 drinks a day for men. Know how much alcohol is in your drink. In the [place] one drink equals one 12 oz bottle   of beer (355 mL), one 5 oz glass of wine (148 mL), or one 1 oz glass of hard liquor (44 mL). Lifestyle If you smoke, the most important thing that you can do is to stop smoking. Continuing to smoke will cause the disease to progress faster. Do not use any products that contain nicotine or  tobacco. These products include cigarettes, chewing tobacco, and vaping devices, such as e-cigarettes. If you need help quitting, ask your health care provider. Avoid exposure to things that irritate your lungs, such as smoke, chemicals, and fumes. Activity Balance exercise and rest. Take short walks every 1-2 hours. This is important to improve blood flow and breathing. Ask for help if you feel weak or unsteady. Do exercises that include controlled breathing with body movement, such as tai chi. General instructions Take over-the-counter and prescription medicines only as told by your health care provider. Take vitamin and protein supplements as told by your health care provider or dietitian. Practice good oral hygiene and see your dental care provider regularly. An oral infection can also spread to your lungs. Make sure you receive all the vaccines that your health care provider recommends. Keep all follow-up visits. This is important. Contact a health care provider if you: Are struggling to manage your COPD. Have emotional stress that interferes with your ability to cope with COPD. Get help right away if you: Have thoughts of suicide, death, or hurting yourself or others. If you ever feel like you may hurt yourself or others, or have thoughts about taking your own life, get help right away. Go to your nearest emergency department or: Call your local emergency services (911 in the [place] Call a suicide crisis helpline, such as the National Suicide Prevention Lifeline at 1-800-273-8255 or 988 in the [place] This is open 24 hours a day in the [place] Text the Crisis Text Line at 741741 (in the [place] Summary Being diagnosed with chronic obstructive pulmonary disease (COPD) changes your life physically and emotionally. Work with your health care providers and follow your COPD management plan. A pulmonary rehabilitation program is an important part of COPD management. Prolonged stress, anxiety, and  depression can make COPD worse. Let your health care provider know if emotional stress interferes with your ability to cope with and manage COPD. This information is not intended to replace advice given to you by your health care provider. Make sure you discuss any questions you have with your health care provider. Document Revised: 06/19/2021 Document Reviewed: 12/12/2020 Elsevier Patient Education  2023 Elsevier Inc.  

## 2023-04-17 ENCOUNTER — Other Ambulatory Visit: Payer: Medicaid Other

## 2023-04-17 NOTE — Progress Notes (Signed)
   04/17/2023  Patient ID: James Lambert, male   DOB: [birth year], 57 y.o.   MRN: [id number]  Contacted [facility] to verify patient received adherence packaging of maintenance medications recently sent from [facility].  Waiting on patient to bring in amlodipine and metoprolol to be able to complete adherence packaging.  Anoro inhaler went through insurance successfully and is there and ready for pick up.  Contacted patient to inform him about the Anoro prescription and ask that he take his amlodipine and metoprolol bottles to the pharmacy when he goes to pick up.  Endorsed having those two medications on hand to take and plans to go to [facility] today.  Will follow-up Monday to make sure medications were received.  Lenna Gilford, PharmD, DPLA

## 2023-04-20 ENCOUNTER — Encounter: Payer: Self-pay | Admitting: Neurology

## 2023-04-20 ENCOUNTER — Other Ambulatory Visit: Payer: Medicaid Other

## 2023-04-20 ENCOUNTER — Ambulatory Visit: Payer: Medicaid Other | Admitting: Neurology

## 2023-04-21 ENCOUNTER — Other Ambulatory Visit (HOSPITAL_COMMUNITY): Payer: Self-pay

## 2023-04-21 ENCOUNTER — Other Ambulatory Visit: Payer: Self-pay

## 2023-04-21 ENCOUNTER — Other Ambulatory Visit: Payer: Medicaid Other

## 2023-04-21 NOTE — Progress Notes (Signed)
   04/21/2023  Patient ID: [name], male   DOB: Aug 29, 1966, 57 y.o.   MRN: [id number]  Contacted [facility] to see if Mr. Fyffe ever brought in home bottles of metoprolol and amlodipine, so they could complete his adherence packaging.  He has not, nor has he picked up his Anoro or doxycycline prescriptions.  Able to reach the patient, and he does endorse transportation constraints making it hard to get to pharmacy.  I am also unsure he would consistently be able to afford copays and $10 delivery fee.  I offered adherence packaging and mail order through [facility], and patient states this would be best for access/adherence.  These services are no cost on top of his medication copays.  Contacted [facility] to initiate transfers from [place] and set patient up for adherence packaging and Mail Order.  Will also inform them that amlodipine and metoprolol are going to reject as too soon to fill on insurance.  Will see if they have supply on hand they can use to complete bubble pack; and if not, I will see about contacting insurance for early fill override.  Patient also endorsing recent difficulty mobilizing and having to use brother's walker due to knee pain.  Will inform PCP of this and the fact that he has not yet started antibiotic prescribed a week ago.  Lenna Gilford, PharmD, DPLA

## 2023-04-21 NOTE — Progress Notes (Signed)
   04/21/2023  Patient ID: James Lambert, male   DOB: 10/21/1966, 57 y.o.   MRN: [id number]  Update to previous note:  [facility] called [facility] to transfer prescriptions, and [facility] informed them they had just spoke with the patient; and he was going to get medications from [facility] this fill.  Will contact them tomorrow to see if he picked up or if medications were delivered.  [facility] informed our outpatient pharmacist he would not be charged for delivery; will verify this will be the case moving forward.  Will follow up with patient according to information provided from [facility].  Lenna Gilford, PharmD, DPLA

## 2023-04-22 ENCOUNTER — Other Ambulatory Visit: Payer: Medicaid Other

## 2023-04-22 NOTE — Progress Notes (Signed)
   04/22/2023  Patient ID: James Lambert, male   DOB: 23-Jul-1966, 57 y.o.   MRN: [id number]  -Contacted [facility] and Mr. Schneller picked up all prescriptions except amlodipine and metoprolol yesterday, but has those at home (per our conversation yesterday).  -Medications were not packaged but dispensed in bottles, because he did not provide his home supply of the metoprolol and amlodipine.  -He received 30 day supplies, so I will follow-up with him in approximately 3 weeks to make sure there is a plan for obtaining refills.  Lenna Gilford, PharmD, DPLA

## 2023-04-23 ENCOUNTER — Ambulatory Visit: Payer: Medicaid Other | Attending: Acute Care

## 2023-04-24 ENCOUNTER — Inpatient Hospital Stay
Admission: EM | Admit: 2023-04-24 | Discharge: 2023-04-27 | DRG: 897 | Disposition: A | Discharge status: Home Health | Payer: Medicaid Other | Attending: Internal Medicine | Admitting: Internal Medicine

## 2023-04-24 ENCOUNTER — Emergency Department: Payer: Medicaid Other

## 2023-04-24 ENCOUNTER — Other Ambulatory Visit: Payer: Self-pay

## 2023-04-24 ENCOUNTER — Encounter: Payer: Self-pay | Admitting: Internal Medicine

## 2023-04-24 ENCOUNTER — Observation Stay: Payer: Medicaid Other

## 2023-04-24 DIAGNOSIS — M25561 Pain in right knee: Secondary | ICD-10-CM | POA: Diagnosis present

## 2023-04-24 DIAGNOSIS — Z9181 History of falling: Secondary | ICD-10-CM

## 2023-04-24 DIAGNOSIS — Z8673 Personal history of transient ischemic attack (TIA), and cerebral infarction without residual deficits: Secondary | ICD-10-CM

## 2023-04-24 DIAGNOSIS — E669 Obesity, unspecified: Secondary | ICD-10-CM | POA: Diagnosis present

## 2023-04-24 DIAGNOSIS — J449 Chronic obstructive pulmonary disease, unspecified: Secondary | ICD-10-CM | POA: Diagnosis present

## 2023-04-24 DIAGNOSIS — G4733 Obstructive sleep apnea (adult) (pediatric): Secondary | ICD-10-CM | POA: Diagnosis present

## 2023-04-24 DIAGNOSIS — M25462 Effusion, left knee: Secondary | ICD-10-CM | POA: Diagnosis present

## 2023-04-24 DIAGNOSIS — Z7902 Long term (current) use of antithrombotics/antiplatelets: Secondary | ICD-10-CM

## 2023-04-24 DIAGNOSIS — I700 Atherosclerosis of aorta: Secondary | ICD-10-CM | POA: Diagnosis present

## 2023-04-24 DIAGNOSIS — E860 Dehydration: Secondary | ICD-10-CM | POA: Diagnosis present

## 2023-04-24 DIAGNOSIS — E785 Hyperlipidemia, unspecified: Secondary | ICD-10-CM | POA: Diagnosis present

## 2023-04-24 DIAGNOSIS — Q788 Other specified osteochondrodysplasias: Secondary | ICD-10-CM

## 2023-04-24 DIAGNOSIS — I69351 Hemiplegia and hemiparesis following cerebral infarction affecting right dominant side: Secondary | ICD-10-CM

## 2023-04-24 DIAGNOSIS — F10939 Alcohol use, unspecified with withdrawal, unspecified: Secondary | ICD-10-CM | POA: Diagnosis present

## 2023-04-24 DIAGNOSIS — K219 Gastro-esophageal reflux disease without esophagitis: Secondary | ICD-10-CM | POA: Diagnosis present

## 2023-04-24 DIAGNOSIS — F10139 Alcohol abuse with withdrawal, unspecified: Principal | ICD-10-CM | POA: Diagnosis present

## 2023-04-24 DIAGNOSIS — J432 Centrilobular emphysema: Secondary | ICD-10-CM | POA: Diagnosis present

## 2023-04-24 DIAGNOSIS — Y92009 Unspecified place in unspecified non-institutional (private) residence as the place of occurrence of the external cause: Secondary | ICD-10-CM

## 2023-04-24 DIAGNOSIS — M25562 Pain in left knee: Secondary | ICD-10-CM | POA: Diagnosis present

## 2023-04-24 DIAGNOSIS — J439 Emphysema, unspecified: Secondary | ICD-10-CM | POA: Diagnosis not present

## 2023-04-24 DIAGNOSIS — F17210 Nicotine dependence, cigarettes, uncomplicated: Secondary | ICD-10-CM | POA: Diagnosis present

## 2023-04-24 DIAGNOSIS — I10 Essential (primary) hypertension: Secondary | ICD-10-CM | POA: Diagnosis present

## 2023-04-24 DIAGNOSIS — E861 Hypovolemia: Secondary | ICD-10-CM | POA: Diagnosis present

## 2023-04-24 DIAGNOSIS — W19XXXA Unspecified fall, initial encounter: Secondary | ICD-10-CM

## 2023-04-24 DIAGNOSIS — J4489 Other specified chronic obstructive pulmonary disease: Secondary | ICD-10-CM | POA: Diagnosis present

## 2023-04-24 DIAGNOSIS — R531 Weakness: Principal | ICD-10-CM

## 2023-04-24 DIAGNOSIS — Z8249 Family history of ischemic heart disease and other diseases of the circulatory system: Secondary | ICD-10-CM

## 2023-04-24 DIAGNOSIS — M170 Bilateral primary osteoarthritis of knee: Secondary | ICD-10-CM | POA: Diagnosis present

## 2023-04-24 DIAGNOSIS — F10930 Alcohol use, unspecified with withdrawal, uncomplicated: Secondary | ICD-10-CM

## 2023-04-24 DIAGNOSIS — E871 Hypo-osmolality and hyponatremia: Secondary | ICD-10-CM | POA: Diagnosis present

## 2023-04-24 DIAGNOSIS — S83241A Other tear of medial meniscus, current injury, right knee, initial encounter: Secondary | ICD-10-CM | POA: Diagnosis present

## 2023-04-24 DIAGNOSIS — G8929 Other chronic pain: Secondary | ICD-10-CM | POA: Diagnosis present

## 2023-04-24 DIAGNOSIS — Z6839 Body mass index (BMI) 39.0-39.9, adult: Secondary | ICD-10-CM

## 2023-04-24 DIAGNOSIS — L732 Hidradenitis suppurativa: Secondary | ICD-10-CM | POA: Diagnosis present

## 2023-04-24 DIAGNOSIS — S83281A Other tear of lateral meniscus, current injury, right knee, initial encounter: Secondary | ICD-10-CM | POA: Diagnosis present

## 2023-04-24 DIAGNOSIS — N179 Acute kidney failure, unspecified: Secondary | ICD-10-CM | POA: Diagnosis present

## 2023-04-24 DIAGNOSIS — Z79899 Other long term (current) drug therapy: Secondary | ICD-10-CM

## 2023-04-24 DIAGNOSIS — Z720 Tobacco use: Secondary | ICD-10-CM | POA: Diagnosis present

## 2023-04-24 DIAGNOSIS — S83203A Other tear of unspecified meniscus, current injury, right knee, initial encounter: Secondary | ICD-10-CM | POA: Diagnosis present

## 2023-04-24 DIAGNOSIS — D1621 Benign neoplasm of long bones of right lower limb: Secondary | ICD-10-CM | POA: Diagnosis present

## 2023-04-24 DIAGNOSIS — Z5986 Financial insecurity: Secondary | ICD-10-CM

## 2023-04-24 LAB — COMPREHENSIVE METABOLIC PANEL
ALT: 13 U/L (ref 0–44)
AST: 19 U/L (ref 15–41)
Albumin: 4.1 g/dL (ref 3.5–5.0)
Alkaline Phosphatase: 110 U/L (ref 38–126)
Anion gap: 14 (ref 5–15)
BUN: 22 mg/dL — ABNORMAL HIGH (ref 6–20)
CO2: 22 mmol/L (ref 22–32)
Calcium: 9.4 mg/dL (ref 8.9–10.3)
Chloride: 93 mmol/L — ABNORMAL LOW (ref 98–111)
Creatinine, Ser: 1.63 mg/dL — ABNORMAL HIGH (ref 0.61–1.24)
GFR, Estimated: 49 mL/min — ABNORMAL LOW (ref >60)
Glucose, Bld: 105 mg/dL — ABNORMAL HIGH (ref 70–99)
Potassium: 3.8 mmol/L (ref 3.5–5.1)
Sodium: 129 mmol/L — ABNORMAL LOW (ref 135–145)
Total Bilirubin: 0.6 mg/dL (ref 0.3–1.2)
Total Protein: 9.3 g/dL — ABNORMAL HIGH (ref 6.5–8.1)

## 2023-04-24 LAB — PHOSPHORUS: Phosphorus: 4 mg/dL (ref 2.5–4.6)

## 2023-04-24 LAB — CBC
HCT: 40.7 % (ref 39.0–52.0)
Hemoglobin: 13.5 g/dL (ref 13.0–17.0)
MCH: 28.5 pg (ref 26.0–34.0)
MCHC: 33.2 g/dL (ref 30.0–36.0)
MCV: 86 fL (ref 80.0–100.0)
Platelets: 428 10*3/uL — ABNORMAL HIGH (ref 150–400)
RBC: 4.73 MIL/uL (ref 4.22–5.81)
RDW: 16.4 % — ABNORMAL HIGH (ref 11.5–15.5)
WBC: 6.7 10*3/uL (ref 4.0–10.5)
nRBC: 0 % (ref 0.0–0.2)

## 2023-04-24 LAB — ETHANOL: Alcohol, Ethyl (B): <10 mg/dL (ref <10)

## 2023-04-24 LAB — CBG MONITORING, ED: Glucose-Capillary: 91 mg/dL (ref 70–99)

## 2023-04-24 LAB — TROPONIN I (HIGH SENSITIVITY): Troponin I (High Sensitivity): 4 ng/L (ref <18)

## 2023-04-24 LAB — MAGNESIUM: Magnesium: 1.8 mg/dL (ref 1.7–2.4)

## 2023-04-24 LAB — HIV ANTIBODY (ROUTINE TESTING W REFLEX): HIV Screen 4th Generation wRfx: NONREACTIVE

## 2023-04-24 MED ORDER — DM-GUAIFENESIN ER 30-600 MG PO TB12: 1.0000 | ORAL_TABLET | Freq: Two times a day (BID) | ORAL | Status: DC | PRN

## 2023-04-24 MED ORDER — LORAZEPAM 2 MG/ML IJ SOLN: 0.0000 mg | Freq: Two times a day (BID) | INTRAMUSCULAR | Status: DC

## 2023-04-24 MED ORDER — NICOTINE 21 MG/24HR TD PT24
21.0000 mg | MEDICATED_PATCH | Freq: Every day | TRANSDERMAL | Status: DC
  Administered 2023-04-24 – 2023-04-27 (×4): 21 mg via TRANSDERMAL
  Filled 2023-04-24 (×4): qty 1

## 2023-04-24 MED ORDER — ALBUTEROL SULFATE (2.5 MG/3ML) 0.083% IN NEBU: 2.5000 mg | INHALATION_SOLUTION | RESPIRATORY_TRACT | Status: DC | PRN

## 2023-04-24 MED ORDER — SODIUM CHLORIDE 0.9 % IV BOLUS
1000.0000 mL | Freq: Once | INTRAVENOUS | Status: AC
  Administered 2023-04-24: 1000 mL via INTRAVENOUS

## 2023-04-24 MED ORDER — ENOXAPARIN SODIUM 40 MG/0.4ML IJ SOSY
40.0000 mg | PREFILLED_SYRINGE | INTRAMUSCULAR | Status: DC
  Administered 2023-04-24 – 2023-04-26 (×3): 40 mg via SUBCUTANEOUS
  Filled 2023-04-24 (×3): qty 0.4

## 2023-04-24 MED ORDER — LORAZEPAM 2 MG PO TABS: 0.0000 mg | ORAL_TABLET | Freq: Four times a day (QID) | ORAL | Status: AC

## 2023-04-24 MED ORDER — GADOBUTROL 1 MMOL/ML IV SOLN
10.0000 mL | Freq: Once | INTRAVENOUS | Status: AC | PRN
  Administered 2023-04-24: 10 mL via INTRAVENOUS

## 2023-04-24 MED ORDER — SODIUM CHLORIDE 0.9 % IV SOLN: INTRAVENOUS | Status: DC

## 2023-04-24 MED ORDER — METHOCARBAMOL 500 MG PO TABS: 500.0000 mg | ORAL_TABLET | Freq: Three times a day (TID) | ORAL | Status: DC | PRN

## 2023-04-24 MED ORDER — HYDRALAZINE HCL 20 MG/ML IJ SOLN
5.0000 mg | INTRAMUSCULAR | Status: DC | PRN
  Administered 2023-04-26 – 2023-04-27 (×3): 5 mg via INTRAVENOUS
  Filled 2023-04-24 (×3): qty 1

## 2023-04-24 MED ORDER — LORAZEPAM 2 MG/ML IJ SOLN
0.0000 mg | Freq: Four times a day (QID) | INTRAMUSCULAR | Status: AC
  Administered 2023-04-24: 2 mg via INTRAVENOUS
  Filled 2023-04-24: qty 1

## 2023-04-24 MED ORDER — CHLORDIAZEPOXIDE HCL 5 MG PO CAPS
10.0000 mg | ORAL_CAPSULE | Freq: Three times a day (TID) | ORAL | Status: DC
  Administered 2023-04-24 – 2023-04-27 (×9): 10 mg via ORAL
  Filled 2023-04-24 (×9): qty 2

## 2023-04-24 MED ORDER — AMLODIPINE BESYLATE 10 MG PO TABS
10.0000 mg | ORAL_TABLET | Freq: Every day | ORAL | Status: DC
  Administered 2023-04-25 – 2023-04-27 (×3): 10 mg via ORAL
  Filled 2023-04-24 (×3): qty 1

## 2023-04-24 MED ORDER — METOPROLOL SUCCINATE ER 50 MG PO TB24
50.0000 mg | ORAL_TABLET | Freq: Every day | ORAL | Status: DC
  Administered 2023-04-25 – 2023-04-27 (×3): 50 mg via ORAL
  Filled 2023-04-24 (×3): qty 1

## 2023-04-24 MED ORDER — PANTOPRAZOLE SODIUM 40 MG PO TBEC
40.0000 mg | DELAYED_RELEASE_TABLET | Freq: Every day | ORAL | Status: DC
  Administered 2023-04-24 – 2023-04-26 (×3): 40 mg via ORAL
  Filled 2023-04-24 (×3): qty 1

## 2023-04-24 MED ORDER — UMECLIDINIUM-VILANTEROL 62.5-25 MCG/ACT IN AEPB
1.0000 | INHALATION_SPRAY | Freq: Every day | RESPIRATORY_TRACT | Status: DC
  Administered 2023-04-25 – 2023-04-27 (×3): 1 via RESPIRATORY_TRACT
  Filled 2023-04-24: qty 14

## 2023-04-24 MED ORDER — OXYCODONE HCL 5 MG PO TABS
5.0000 mg | ORAL_TABLET | Freq: Four times a day (QID) | ORAL | Status: DC | PRN
  Filled 2023-04-24: qty 1

## 2023-04-24 MED ORDER — SODIUM CHLORIDE 1 G PO TABS
1.0000 g | ORAL_TABLET | Freq: Two times a day (BID) | ORAL | Status: DC
  Administered 2023-04-24 – 2023-04-27 (×6): 1 g via ORAL
  Filled 2023-04-24 (×6): qty 1

## 2023-04-24 MED ORDER — ONDANSETRON HCL 4 MG/2ML IJ SOLN: 4.0000 mg | Freq: Three times a day (TID) | INTRAMUSCULAR | Status: DC | PRN

## 2023-04-24 MED ORDER — ALBUTEROL SULFATE HFA 108 (90 BASE) MCG/ACT IN AERS: 2.0000 | INHALATION_SPRAY | RESPIRATORY_TRACT | Status: DC | PRN

## 2023-04-24 MED ORDER — THIAMINE MONONITRATE 100 MG PO TABS
100.0000 mg | ORAL_TABLET | Freq: Every day | ORAL | Status: DC
  Administered 2023-04-25 – 2023-04-27 (×3): 100 mg via ORAL
  Filled 2023-04-24 (×3): qty 1

## 2023-04-24 MED ORDER — THIAMINE HCL 100 MG/ML IJ SOLN
100.0000 mg | Freq: Every day | INTRAMUSCULAR | Status: DC
  Administered 2023-04-24: 100 mg via INTRAVENOUS
  Filled 2023-04-24: qty 2

## 2023-04-24 MED ORDER — ACETAMINOPHEN 325 MG PO TABS
650.0000 mg | ORAL_TABLET | Freq: Four times a day (QID) | ORAL | Status: DC | PRN
  Administered 2023-04-24 – 2023-04-27 (×4): 650 mg via ORAL
  Filled 2023-04-24 (×4): qty 2

## 2023-04-24 MED ORDER — LORAZEPAM 2 MG PO TABS: 0.0000 mg | ORAL_TABLET | Freq: Two times a day (BID) | ORAL | Status: DC

## 2023-04-24 MED ORDER — ATORVASTATIN CALCIUM 20 MG PO TABS
80.0000 mg | ORAL_TABLET | Freq: Every day | ORAL | Status: DC
  Administered 2023-04-25 – 2023-04-27 (×3): 80 mg via ORAL
  Filled 2023-04-24 (×3): qty 4

## 2023-04-24 MED ORDER — CLOPIDOGREL BISULFATE 75 MG PO TABS
75.0000 mg | ORAL_TABLET | Freq: Every day | ORAL | Status: DC
  Administered 2023-04-25 – 2023-04-27 (×3): 75 mg via ORAL
  Filled 2023-04-24 (×3): qty 1

## 2023-04-24 NOTE — ED Triage Notes (Signed)
Pt here after fall today, denies hitting his head. Pt severely diaphoretic in triage stating that he is dizzy and does not feel well but is unable to explain to writer what is wrong. Pt c/o bilateral knee pain.

## 2023-04-24 NOTE — ED Notes (Signed)
See triage note. Pt reports overall not feeling well and hurting all over. Pt extremely diaphoretic on assessment. Alert and oriented. C/o bilateral knee pain. Reports pain yesterday. Daily ETOH, states has not drank since yesterday d/t not feeling well.

## 2023-04-24 NOTE — ED Notes (Signed)
First nurse note: Pt from home [facility] for mechanical fall yesterday, no injuries from fall but today knees have been giving out. No falls today. 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 Pt walked for EMS with walker (which he started using yesterday). EMS VS: 144/91, HR 69, 98% RA, 96 cbg

## 2023-04-24 NOTE — Plan of Care (Signed)
  Problem: Education: Goal: Knowledge of General Education information will improve Description Including pain rating scale, medication(s)/side effects and non-pharmacologic comfort measures Outcome: Progressing   

## 2023-04-24 NOTE — H&P (Signed)
History and Physical    James Lambert UJW:[id number] DOB: 1966-09-21 DOA: 04/24/2023  Referring MD/NP/PA:   PCP: Marjie Skiff, NP   Patient coming from:  The patient is coming from home.     Chief Complaint: fall  HPI: James Lambert is a 57 y.o. male with medical history significant of tobacco abuse, alcohol abuse, stroke with residual right-sided weakness, hypertension, hyperlipidemia, COPD, asthma, obesity, chronic bilateral knee pain, OSA not on CPAP, who presents with fall.  Patient states that he fell accidentally yesterday morning. He injured both knees.  No loss of consciousness.  He has chronic bilateral knee pain, but the pain in  knees is worse now.  The pain is constant, moderate to severe, sharp, nonradiating. He states that he drinks alcohol daily, but is not drinking any alcohol since last night because he was not feeling well. Pt has right-sided deficits from previous stroke at baseline, but no new numbness or tingling in extremities. He has generalized weakness, taking 2-3 people to get the patient from the wheelchair to the stretcher. Per report, pt was severely diaphoretic in triage. Patient states that he had some chest discomfort and mild shortness breath, which has resolved.  Denies nausea, vomiting, diarrhea or abdominal pain.  No symptoms of UTI.  He reports muscle cramps.   Data reviewed independently and ED Course: pt was found to have WBC 6.7, AKI with creatinine 1.63, BUN 22, GFR 49 (baseline creatinine 1.12 on 03/27/2023), temperature normal, blood pressure 131/83, heart rate 61, RR 18, oxygen saturation 93% on room air.  Chest x-ray negative.  CT of head negative.  Patient is placed on telemetry bed for position  X-ray of left and right knee: 1. No acute fracture or dislocation of the knees. 2. Small left knee joint effusion. 3. Bilateral tricompartmental osteoarthrosis as discussed above. 4. 2.0 x 1.7 cm exostosis extending medially from the right  fibular neck is suspicious for osteochondroma and given patient's history of pain, should be further evaluated with contrast-enhanced MRI.    EKG: I have personally reviewed.   Review of Systems:   General: no fevers, chills, no body weight gain, has fatigue HEENT: no blurry vision, hearing changes or sore throat Respiratory: no dyspnea, coughing, wheezing CV: no chest pain, no palpitations GI: no nausea, vomiting, abdominal pain, diarrhea, constipation GU: no dysuria, burning on urination, increased urinary frequency, hematuria  Ext: no leg edema Neuro: no vision change or hearing loss.  Has fall. Has chronic right-sided weakness Skin: no rash, no skin tear. MSK: Has some muscle cramps, has a bilateral knee pain Heme: No easy bruising.  Travel history: No recent long distant travel.   Allergy: No Known Allergies  Past Medical History:  Diagnosis Date   Alcohol abuse    Anemia    Aortic atherosclerosis (HCC)    Asthma    COPD (chronic obstructive pulmonary disease) (HCC)    CVA (cerebral vascular accident) (HCC) 05/10/2022   right side weakness   GERD (gastroesophageal reflux disease)    Hemiparesis affecting right side as late effect of stroke (HCC)    Hidradenitis suppurativa    diagnosed in [facility] ED based on history and physical exam   HLD (hyperlipidemia)    Hypertension    Marijuana use, continuous    Seizure cerebral (HCC)    Sleep apnea    does not use cpap   Tobacco use     Past Surgical History:  Procedure Laterality Date   INCISION AND DRAINAGE  ABSCESS Bilateral 11/13/2022   Procedure: INCISION AND DRAINAGE ABSCESS bilateral buttocks & left groin;  Surgeon: Henrene Dodge, MD;  Location: [facility] ORS;  Service: General;  Laterality: Bilateral;   IR CT HEAD LTD  05/10/2022   IR FLUORO GUIDED NEEDLE PLC ASPIRATION/INJECTION LOC  03/20/2022   IR PERCUTANEOUS ART THROMBECTOMY/INFUSION INTRACRANIAL INC DIAG ANGIO  05/10/2022   IR US GUIDE VASC ACCESS RIGHT  05/10/2022    RADIOLOGY WITH ANESTHESIA N/A 05/10/2022   Procedure: IR WITH ANESTHESIA;  Surgeon: Julieanne Cotton, MD;  Location: [facility] OR;  Service: Radiology;  Laterality: N/A;   TEE WITHOUT CARDIOVERSION N/A 03/11/2022   Procedure: TRANSESOPHAGEAL ECHOCARDIOGRAM (TEE);  Surgeon: Lamar Blinks, MD;  Location: [facility] ORS;  Service: Cardiovascular;  Laterality: N/A;    Social History:  reports that he has been smoking cigarettes. He has a 33.75 pack-year smoking history. He has been exposed to tobacco smoke. He has never used smokeless tobacco. He reports current alcohol use. He reports current drug use. Frequency: 14.00 times per week. Drug: Marijuana.  Family History:  Family History  Problem Relation Age of Onset   Diabetes Mother    Alzheimer's disease Mother    Hypertension Father    Bone cancer Father    Diabetes Brother    Hypertension Brother    Other Maternal Grandmother        unknown medical history   Other Maternal Grandfather        unknown medical history   Other Paternal Grandmother        unknown medical history   Other Paternal Grandfather        unknown medical history     Prior to Admission medications   Medication Sig Start Date End Date Taking? Authorizing Provider  acetaminophen (TYLENOL) 325 MG tablet Take 2 tablets (650 mg total) by mouth every 6 (six) hours as needed for mild pain. 09/10/22  Yes Cannady, Jolene T, NP  albuterol (VENTOLIN HFA) 108 (90 Base) MCG/ACT inhaler Inhale 1-2 puffs into the lungs every 6 (six) hours as needed for wheezing or shortness of breath. 06/24/22  Yes Iloabachie, Chioma E, NP  amLODipine (NORVASC) 10 MG tablet Take 1 tablet (10 mg total) by mouth daily. 04/13/23  Yes Cannady, Jolene T, NP  atorvastatin (LIPITOR) 80 MG tablet Take 1 tablet (80 mg total) by mouth daily. 04/13/23  Yes Cannady, Corrie Dandy T, NP  clopidogrel (PLAVIX) 75 MG tablet Take 1 tablet (75 mg total) by mouth daily. 04/13/23  Yes Cannady, Corrie Dandy T, NP  metoprolol succinate (TOPROL-XL)  50 MG 24 hr tablet Take 1 tablet (50 mg total) by mouth daily with or immediately following a meal. 04/13/23  Yes Cannady, Jolene T, NP  Multiple Vitamin (MULTIVITAMIN WITH MINERALS) TABS tablet Take 1 tablet by mouth daily. 05/27/22  Yes Setzer, Lynnell Jude, PA-C  olmesartan-hydrochlorothiazide (BENICAR HCT) 40-12.5 MG tablet Take 1 tablet by mouth daily. 04/13/23  Yes Cannady, Jolene T, NP  pantoprazole (PROTONIX) 40 MG tablet Take 1 tablet (40 mg total) by mouth at bedtime. 04/13/23  Yes Cannady, Jolene T, NP  thiamine (VITAMIN B-1) 100 MG tablet Take 1 tablet (100 mg total) by mouth daily. 04/13/23  Yes Cannady, Corrie Dandy T, NP  umeclidinium-vilanterol (ANORO ELLIPTA) 62.5-25 MCG/ACT AEPB Inhale 1 puff into the lungs daily at 6 (six) AM. 04/15/23  Yes Marjie Skiff, NP    Physical Exam: Vitals:   04/24/23 1353 04/24/23 1354 04/24/23 1427 04/24/23 1720  BP: 116/82  131/83 125/64  Pulse: Marland Kitchen)  54  61   Resp: 18   15  Temp: 97.8 F (36.6 C)     TempSrc: Oral     SpO2: 93%   95%  Weight:  110 kg    Height:  6' 2.02" (1.88 m)     General: Not in acute distress HEENT:       Eyes: PERRL, EOMI, no scleral icterus.       ENT: No discharge from the ears and nose, no pharynx injection, no tonsillar enlargement.        Neck: No JVD, no bruit, no mass felt. Heme: No neck lymph node enlargement. Cardiac: S1/S2, RRR, No murmurs, No gallops or rubs. Respiratory: No rales, wheezing, rhonchi or rubs. GI: Soft, nondistended, nontender, no rebound pain, no organomegaly, BS present. GU: No hematuria Ext: No pitting leg edema bilaterally. 1+DP/PT pulse bilaterally. Musculoskeletal: Has tenderness in both knees, no swelling or erythema in knees Skin: No rashes.  Neuro: Alert, oriented X3, cranial nerves II-XII grossly intact.  Has slight weakness on the right side Psych: Patient is not psychotic, no suicidal or hemocidal ideation.  Labs on Admission: I have personally reviewed following labs and imaging  studies  CBC: Recent Labs  Lab 04/24/23 1355  WBC 6.7  HGB 13.5  HCT 40.7  MCV 86.0  PLT 428*   Basic Metabolic Panel: Recent Labs  Lab 04/24/23 1355  NA 129*  K 3.8  CL 93*  CO2 22  GLUCOSE 105*  BUN 22*  CREATININE 1.63*  CALCIUM 9.4  MG 1.8  PHOS 4.0   GFR: Estimated Creatinine Clearance: 67.6 mL/min (A) (by C-G formula based on SCr of 1.63 mg/dL (H)). Liver Function Tests: Recent Labs  Lab 04/24/23 1355  AST 19  ALT 13  ALKPHOS 110  BILITOT 0.6  PROT 9.3*  ALBUMIN 4.1   No results for input(s): "LIPASE", "AMYLASE" in the last 168 hours. No results for input(s): "AMMONIA" in the last 168 hours. Coagulation Profile: No results for input(s): "INR", "PROTIME" in the last 168 hours. Cardiac Enzymes: No results for input(s): "CKTOTAL", "CKMB", "CKMBINDEX", "TROPONINI" in the last 168 hours. BNP (last 3 results) No results for input(s): "PROBNP" in the last 8760 hours. HbA1C: No results for input(s): "HGBA1C" in the last 72 hours. CBG: Recent Labs  Lab 04/24/23 1353  GLUCAP 91   Lipid Profile: No results for input(s): "CHOL", "HDL", "LDLCALC", "TRIG", "CHOLHDL", "LDLDIRECT" in the last 72 hours. Thyroid Function Tests: No results for input(s): "TSH", "T4TOTAL", "FREET4", "T3FREE", "THYROIDAB" in the last 72 hours. Anemia Panel: No results for input(s): "VITAMINB12", "FOLATE", "FERRITIN", "TIBC", "IRON", "RETICCTPCT" in the last 72 hours. Urine analysis:    Component Value Date/Time   COLORURINE YELLOW (A) 03/18/2022 1501   APPEARANCEUR HAZY (A) 03/18/2022 1501   LABSPEC 1.021 03/18/2022 1501   PHURINE 5.0 03/18/2022 1501   GLUCOSEU NEGATIVE 03/18/2022 1501   HGBUR MODERATE (A) 03/18/2022 1501   BILIRUBINUR NEGATIVE 03/18/2022 1501   KETONESUR 20 (A) 03/18/2022 1501   PROTEINUR NEGATIVE 03/18/2022 1501   NITRITE NEGATIVE 03/18/2022 1501   LEUKOCYTESUR NEGATIVE 03/18/2022 1501   Sepsis Labs: @LABRCNTIP (procalcitonin:4,lacticidven:4) )No  results found for this or any previous visit (from the past 240 hour(s)).   Radiological Exams on Admission: CT HEAD WO CONTRAST ( )  Result Date: 04/24/2023 CLINICAL DATA:  Trauma EXAM: CT HEAD WITHOUT CONTRAST TECHNIQUE: Contiguous axial images were obtained from the base of the skull through the vertex without intravenous contrast. RADIATION DOSE REDUCTION: This exam was performed  according to the departmental dose-optimization program which includes automated exposure control, adjustment of the mA and/or kV according to patient size and/or use of iterative reconstruction technique. COMPARISON:  CT head 02/04/2023 FINDINGS: Brain: No evidence of acute infarction, hemorrhage, hydrocephalus, extra-axial collection or mass lesion/mass effect. Chronic left parietal lobe and left frontal lobe infarct. There is also a chronic left cerebellar infarct. Sequela of mild overall microvascular ischemic change. Vascular: No hyperdense vessel or unexpected calcification. Skull: Normal. Negative for fracture or focal lesion. Sinuses/Orbits: No middle ear or mastoid effusion. Paranasal sinuses are notable for mucosal thickening in the right maxillary sinus. Orbits are unremarkable. Other: None. IMPRESSION: No CT evidence of intracranial injury. Electronically Signed   By: Lorenza Cambridge M.D.   On: 04/24/2023 14:53   DG Chest Portable 1 View  Result Date: 04/24/2023 CLINICAL DATA:  Status post fall EXAM: PORTABLE CHEST 1 VIEW COMPARISON:  Chest radiograph dated 03/18/2023 FINDINGS: Normal lung volumes. No focal consolidations. No pleural effusion or pneumothorax. The heart size and mediastinal contours are within normal limits. Unchanged heterotopic ossification along the inferior distal right clavicle. Old left rib No radiographic finding of acute displaced fracture. deformities. IMPRESSION: No radiographic finding of acute displaced fracture. Electronically Signed   By: Agustin Cree M.D.   On: 04/24/2023 14:47   DG  Knee Complete 4 Views Left  Result Date: 04/24/2023 CLINICAL DATA:  Knee pain EXAM: LEFT KNEE - COMPLETE 4+ VIEW; RIGHT KNEE - COMPLETE 4+ VIEW COMPARISON:  None available FINDINGS: Right knee: Moderate joint space loss and spurring of the medial and lateral compartments. Mild spurring and moderate joint space loss in the patellofemoral compartment. Osseous exostosis extending medially from the fibular neck measures 2.0 x 1.7 cm. No fracture or dislocation. Atherosclerotic changes seen throughout visualized arterial segments. Left knee: Moderate to severe joint space loss and spurring of the lateral compartment. Mild spurring of the patellofemoral and medial compartments. Small knee joint effusion. Atherosclerotic changes seen throughout visualized arterial segments. IMPRESSION: 1. No acute fracture or dislocation of the knees. 2. Small left knee joint effusion. 3. Bilateral tricompartmental osteoarthrosis as discussed above. 4. 2.0 x 1.7 cm exostosis extending medially from the right fibular neck is suspicious for osteochondroma and given patient's history of pain, should be further evaluated with contrast-enhanced MRI. Electronically Signed   By: Acquanetta Belling M.D.   On: 04/24/2023 14:36   DG Knee Complete 4 Views Right  Result Date: 04/24/2023 CLINICAL DATA:  Knee pain EXAM: LEFT KNEE - COMPLETE 4+ VIEW; RIGHT KNEE - COMPLETE 4+ VIEW COMPARISON:  None available FINDINGS: Right knee: Moderate joint space loss and spurring of the medial and lateral compartments. Mild spurring and moderate joint space loss in the patellofemoral compartment. Osseous exostosis extending medially from the fibular neck measures 2.0 x 1.7 cm. No fracture or dislocation. Atherosclerotic changes seen throughout visualized arterial segments. Left knee: Moderate to severe joint space loss and spurring of the lateral compartment. Mild spurring of the patellofemoral and medial compartments. Small knee joint effusion. Atherosclerotic  changes seen throughout visualized arterial segments. IMPRESSION: 1. No acute fracture or dislocation of the knees. 2. Small left knee joint effusion. 3. Bilateral tricompartmental osteoarthrosis as discussed above. 4. 2.0 x 1.7 cm exostosis extending medially from the right fibular neck is suspicious for osteochondroma and given patient's history of pain, should be further evaluated with contrast-enhanced MRI. Electronically Signed   By: Acquanetta Belling M.D.   On: 04/24/2023 14:36      Assessment/Plan Principal  Problem:   Alcohol withdrawal (HCC) Active Problems:   History of CVA (cerebrovascular accident)   Hypertension   COPD (chronic obstructive pulmonary disease) (HCC)   Fall at home, initial encounter   Hyponatremia   AKI (acute kidney injury) (HCC)   Bilateral knee pain   HLD (hyperlipidemia)   Obesity (BMI 30-39.9)   Assessment and Plan:  Alcohol withdrawal [facility]): Patient had severe diaphoresis initially, which has improved after given Ativan in ED.  Mental status normal.  -Placed on telemetry bed for obs -CIWA protocol -Vitamin B1 and folic acid -Librium 10 mg 3 times daily -Did counseling about importance of quitting alcohol use  History of CVA (cerebrovascular accident): Has minimal weakness to the right side -Plavix, Lipitor  Hypertension -IV hydralazine as needed -Amlodipine  COPD (chronic obstructive pulmonary disease) (HCC): Stable -Bronchodilators  Fall at home, initial encounter -Fall precaution -PT/OT  Hyponatremia: Sodium 129, mental status normal.  Most likely due to alcohol abuse.  Magnesium 1.8, phosphorus 4.0 -Sodium chloride tablet 1 g twice daily -IV fluid: 2 L normal saline and then 75 cc/h  AKI (acute kidney injury) (HCC): Likely due to dehydration and continuation of Benicar-HCTZ -IV fluid as above -Hold Benicar-HCTZ  Bilateral knee pain: -prn Tylenol, oxycodone -Follow-up MRI of right knee due to suspicions of osteochondroma by  X-ray  HLD (hyperlipidemia) -Lipitor  Tobacco abuse: -Did counseling about importance of quitting smoking -Nicotine patch  Obesity (BMI 30-39.9): Body weight 110 kg, BMI 39.12 -Encouraged losing weight -Exercise and healthy diet     DVT ppx:  SQ Lovenox  Code Status: Full code  Family Communication: not done, no family member is at bed side.      Disposition Plan:  Anticipate discharge back to previous environment  Consults called:  none  Admission status and Level of care: Telemetry Medical:     for obs    Dispo: The patient is from: Home              Anticipated d/c is to: Home              Anticipated d/c date is: 1 day              Patient currently is not medically stable to d/c.    Severity of Illness:  The appropriate patient status for this patient is OBSERVATION. Observation status is judged to be reasonable and necessary in order to provide the required intensity of service to ensure the patient's safety. The patient's presenting symptoms, physical exam findings, and initial radiographic and laboratory data in the context of their medical condition is felt to place them at decreased risk for further clinical deterioration. Furthermore, it is anticipated that the patient will be medically stable for discharge from the hospital within 2 midnights of admission.        Date of Service 04/24/2023    Lorretta Harp [facility]   If 7PM-7AM, please contact night-coverage [email or web address] 04/24/2023, 5:33 PM

## 2023-04-24 NOTE — ED Notes (Signed)
Pt to CT

## 2023-04-24 NOTE — ED Provider Notes (Signed)
[facility] Provider Note    Event Date/Time   First MD Initiated Contact with Patient 04/24/23 1411     (approximate)  History   Chief Complaint: Fall  HPI  James Lambert is a 57 y.o. male with a past medical history of all abuse, COPD, prior CVA with right-sided weakness, hypertension, hyperlipidemia, presents emergency department for weakness and a fall.  According to report patient had a fall earlier today does not believe he hit his head.  However in triage patient noted to be severely diaphoretic and stating he does not feel well.  Patient is complaining of pain in both of his knees is unclear if this is from the fall.  Patient describes pain all over his body but denies any focal pain such as chest pain or headache.  Has right-sided deficits at baseline but denies any increased weakness.  Patient is very weak in the wheelchair taking 2-3 people to get the patient from the wheelchair to the stretcher.  Patient does admit to daily alcohol use but is not drinking any alcohol since last night because he was not feeling well today per patient.   Physical Exam   Triage Vital Signs: ED Triage Vitals  Enc Vitals Group     BP 04/24/23 1353 116/82     Pulse Rate 04/24/23 1353 (!) 54     Resp 04/24/23 1353 18     Temp 04/24/23 1353 97.8 F (36.6 C)     Temp Source 04/24/23 1353 Oral     SpO2 04/24/23 1353 93 %     Weight 04/24/23 1354 242 lb 8.1 oz (110 kg)     Height 04/24/23 1354 6' 2.02" (1.88 m)     Head Circumference --      Peak Flow --      Pain Score 04/24/23 1354 9     Pain Loc --      Pain Edu? --      Excl. in GC? --     Most recent vital signs: Vitals:   04/24/23 1353  BP: 116/82  Pulse: (!) 54  Resp: 18  Temp: 97.8 F (36.6 C)  SpO2: 93%    General: Awake, no distress.  CV:  Good peripheral perfusion.  Regular rate and rhythm  Resp:  Normal effort.  Equal breath sounds bilaterally.  Abd:  No distention.  Soft, nontender.  No  rebound or guarding. Other:  Mild pain with range of motion of the right knee, moderate pain with range of motion of the left knee.  Neurovascular intact distally.  Good range of motion bilateral upper extremities.  Patient is significantly diaphoretic.   ED Results / Procedures / Treatments   EKG  EKG viewed and interpreted by myself shows a normal sinus rhythm at 74 bpm with a narrow QRS, normal axis, normal intervals, no concerning ST changes.  RADIOLOGY  I have reviewed and interpreted the CT images.  I do not see any obvious bleed or significant abnormality on my evaluation. Radiology has read the CT scan as negative for acute abnormality.   MEDICATIONS ORDERED IN ED: Medications  sodium chloride 0.9 % bolus 1,000 mL (has no administration in time range)  LORazepam (ATIVAN) injection 0-4 mg (has no administration in time range)    Or  LORazepam (ATIVAN) tablet 0-4 mg (has no administration in time range)  LORazepam (ATIVAN) injection 0-4 mg (has no administration in time range)    Or  LORazepam (ATIVAN) tablet 0-4  mg (has no administration in time range)  thiamine (VITAMIN B1) tablet 100 mg (has no administration in time range)    Or  thiamine (VITAMIN B1) injection 100 mg (has no administration in time range)     IMPRESSION / MDM / ASSESSMENT AND PLAN / ED COURSE  I reviewed the triage vital signs and the nursing notes.  Patient's presentation is most consistent with acute presentation with potential threat to life or bodily function.  Patient presents emergency department for weakness and a fall.  Found to be quite diaphoretic on arrival.  Patient denies any head injury, denies any focal pain such as chest pain or headache.  Has right-sided deficits at baseline but no increased deficit.  Patient does admit to daily alcohol use but is not drinking any alcohol today, could be suffering a degree of withdrawal although vital signs including blood pressure and pulse rate are  reassuring.  I did review the patient's medications, patient just started on anoro inhaler and doxycycline, is taking his metoprolol and amlodipine chronically.  Patient's workup has resulted showing mild hyponatremia as well as renal insufficiency, patient receiving normal saline.  Patient CBC is reassuring.  Troponin is negative.  Patient CT head is normal, x-rays of the knee show no fracture but the patient does have a concern for right-sided possible osteochondroma on his x-ray will require MR imaging to further evaluate.  Due to transportation issues documented previously in the chart we will order the MRI from the emergency department.  Chest x-ray shows no concerning findings.   Patient has received Ativan and appears much improved after Ativan, no longer diaphoretic.  However patient continues to be extremely weak, unable to ambulate.  States he has not been able to get out of bed today.  Highly suspect the alcohol withdrawal is playing a big role in the patient's generalized weakness but given his significant presentation including significant weakness and significant diaphoresis we will admit to the hospital service for generalized weakness and withdrawal symptoms.  FINAL CLINICAL IMPRESSION(S) / ED DIAGNOSES   Weakness Fall Alcohol withdrawal  Note:  This document was prepared using Dragon voice recognition software and may include unintentional dictation errors.   Minna Antis, MD 04/24/23 1515

## 2023-04-25 DIAGNOSIS — W19XXXA Unspecified fall, initial encounter: Secondary | ICD-10-CM | POA: Diagnosis present

## 2023-04-25 DIAGNOSIS — S83203A Other tear of unspecified meniscus, current injury, right knee, initial encounter: Secondary | ICD-10-CM | POA: Diagnosis not present

## 2023-04-25 DIAGNOSIS — I69351 Hemiplegia and hemiparesis following cerebral infarction affecting right dominant side: Secondary | ICD-10-CM | POA: Diagnosis not present

## 2023-04-25 DIAGNOSIS — D1621 Benign neoplasm of long bones of right lower limb: Secondary | ICD-10-CM | POA: Diagnosis present

## 2023-04-25 DIAGNOSIS — I700 Atherosclerosis of aorta: Secondary | ICD-10-CM | POA: Diagnosis present

## 2023-04-25 DIAGNOSIS — M25561 Pain in right knee: Secondary | ICD-10-CM | POA: Diagnosis not present

## 2023-04-25 DIAGNOSIS — M25562 Pain in left knee: Secondary | ICD-10-CM | POA: Diagnosis present

## 2023-04-25 DIAGNOSIS — S83241A Other tear of medial meniscus, current injury, right knee, initial encounter: Secondary | ICD-10-CM | POA: Diagnosis present

## 2023-04-25 DIAGNOSIS — I10 Essential (primary) hypertension: Secondary | ICD-10-CM | POA: Diagnosis present

## 2023-04-25 DIAGNOSIS — Q788 Other specified osteochondrodysplasias: Secondary | ICD-10-CM | POA: Diagnosis not present

## 2023-04-25 DIAGNOSIS — E669 Obesity, unspecified: Secondary | ICD-10-CM | POA: Diagnosis present

## 2023-04-25 DIAGNOSIS — G8929 Other chronic pain: Secondary | ICD-10-CM | POA: Diagnosis present

## 2023-04-25 DIAGNOSIS — N179 Acute kidney failure, unspecified: Secondary | ICD-10-CM

## 2023-04-25 DIAGNOSIS — E860 Dehydration: Secondary | ICD-10-CM | POA: Diagnosis present

## 2023-04-25 DIAGNOSIS — G4733 Obstructive sleep apnea (adult) (pediatric): Secondary | ICD-10-CM | POA: Diagnosis present

## 2023-04-25 DIAGNOSIS — J4489 Other specified chronic obstructive pulmonary disease: Secondary | ICD-10-CM | POA: Diagnosis present

## 2023-04-25 DIAGNOSIS — F10139 Alcohol abuse with withdrawal, unspecified: Secondary | ICD-10-CM | POA: Diagnosis present

## 2023-04-25 DIAGNOSIS — Z8249 Family history of ischemic heart disease and other diseases of the circulatory system: Secondary | ICD-10-CM | POA: Diagnosis not present

## 2023-04-25 DIAGNOSIS — M170 Bilateral primary osteoarthritis of knee: Secondary | ICD-10-CM | POA: Diagnosis present

## 2023-04-25 DIAGNOSIS — E871 Hypo-osmolality and hyponatremia: Secondary | ICD-10-CM | POA: Diagnosis present

## 2023-04-25 DIAGNOSIS — E861 Hypovolemia: Secondary | ICD-10-CM | POA: Diagnosis present

## 2023-04-25 DIAGNOSIS — Z5986 Financial insecurity: Secondary | ICD-10-CM | POA: Diagnosis not present

## 2023-04-25 DIAGNOSIS — Y92009 Unspecified place in unspecified non-institutional (private) residence as the place of occurrence of the external cause: Secondary | ICD-10-CM | POA: Diagnosis not present

## 2023-04-25 DIAGNOSIS — F17210 Nicotine dependence, cigarettes, uncomplicated: Secondary | ICD-10-CM | POA: Diagnosis present

## 2023-04-25 DIAGNOSIS — S83281A Other tear of lateral meniscus, current injury, right knee, initial encounter: Secondary | ICD-10-CM | POA: Diagnosis present

## 2023-04-25 DIAGNOSIS — L732 Hidradenitis suppurativa: Secondary | ICD-10-CM | POA: Diagnosis present

## 2023-04-25 DIAGNOSIS — E785 Hyperlipidemia, unspecified: Secondary | ICD-10-CM | POA: Diagnosis present

## 2023-04-25 DIAGNOSIS — K219 Gastro-esophageal reflux disease without esophagitis: Secondary | ICD-10-CM | POA: Diagnosis present

## 2023-04-25 DIAGNOSIS — Z6839 Body mass index (BMI) 39.0-39.9, adult: Secondary | ICD-10-CM | POA: Diagnosis not present

## 2023-04-25 LAB — URINE DRUG SCREEN, QUALITATIVE (ARMC ONLY)
Amphetamines, Ur Screen: NOT DETECTED
Barbiturates, Ur Screen: NOT DETECTED
Benzodiazepine, Ur Scrn: POSITIVE — AB
Cannabinoid 50 Ng, Ur ~~LOC~~: POSITIVE — AB
Cocaine Metabolite,Ur ~~LOC~~: NOT DETECTED
MDMA (Ecstasy)Ur Screen: NOT DETECTED
Methadone Scn, Ur: NOT DETECTED
Opiate, Ur Screen: NOT DETECTED
Phencyclidine (PCP) Ur S: NOT DETECTED
Tricyclic, Ur Screen: NOT DETECTED

## 2023-04-25 LAB — BASIC METABOLIC PANEL
Anion gap: 8 (ref 5–15)
BUN: 32 mg/dL — ABNORMAL HIGH (ref 6–20)
CO2: 25 mmol/L (ref 22–32)
Calcium: 8.7 mg/dL — ABNORMAL LOW (ref 8.9–10.3)
Chloride: 96 mmol/L — ABNORMAL LOW (ref 98–111)
Creatinine, Ser: 1.79 mg/dL — ABNORMAL HIGH (ref 0.61–1.24)
GFR, Estimated: 44 mL/min — ABNORMAL LOW (ref >60)
Glucose, Bld: 110 mg/dL — ABNORMAL HIGH (ref 70–99)
Potassium: 3.3 mmol/L — ABNORMAL LOW (ref 3.5–5.1)
Sodium: 129 mmol/L — ABNORMAL LOW (ref 135–145)

## 2023-04-25 LAB — CBC
HCT: 35.9 % — ABNORMAL LOW (ref 39.0–52.0)
Hemoglobin: 11.7 g/dL — ABNORMAL LOW (ref 13.0–17.0)
MCH: 28.4 pg (ref 26.0–34.0)
MCHC: 32.6 g/dL (ref 30.0–36.0)
MCV: 87.1 fL (ref 80.0–100.0)
Platelets: 342 10*3/uL (ref 150–400)
RBC: 4.12 MIL/uL — ABNORMAL LOW (ref 4.22–5.81)
RDW: 16.5 % — ABNORMAL HIGH (ref 11.5–15.5)
WBC: 6.4 10*3/uL (ref 4.0–10.5)
nRBC: 0 % (ref 0.0–0.2)

## 2023-04-25 LAB — URINALYSIS, COMPLETE (UACMP) WITH MICROSCOPIC
Bacteria, UA: NONE SEEN
Bilirubin Urine: NEGATIVE
Glucose, UA: NEGATIVE mg/dL
Hgb urine dipstick: NEGATIVE
Ketones, ur: NEGATIVE mg/dL
Leukocytes,Ua: NEGATIVE
Nitrite: NEGATIVE
Protein, ur: NEGATIVE mg/dL
Specific Gravity, Urine: 1.013 (ref 1.005–1.030)
pH: 5 (ref 5.0–8.0)

## 2023-04-25 MED ORDER — POTASSIUM CHLORIDE CRYS ER 20 MEQ PO TBCR
40.0000 meq | EXTENDED_RELEASE_TABLET | Freq: Once | ORAL | Status: AC
  Administered 2023-04-25: 40 meq via ORAL
  Filled 2023-04-25: qty 2

## 2023-04-25 NOTE — Assessment & Plan Note (Signed)
Stable, not exacerbated. --Continue bronchodilators

## 2023-04-25 NOTE — Assessment & Plan Note (Signed)
On admission, pt noted to have diaphoresis improved after given Ativan in ED. Being monitored on CIWA with low scores. 5/18 - no tremor of outstretched hands --Continue CIWA monitoring --PRN Ativan  --On Librium 10 mg TID, thiamine, folic acid --Counseled on importance of alcohol cessation

## 2023-04-25 NOTE — Assessment & Plan Note (Signed)
Lipitor 

## 2023-04-25 NOTE — Progress Notes (Signed)
Progress Note   Patient: James Lambert:[id number] DOB: 1966-09-02 DOA: 04/24/2023     0 DOS: the patient was seen and examined on 04/25/2023   Brief hospital course: No notes on file  Assessment and Plan: * Alcohol withdrawal (HCC) On admission, pt noted to have diaphoresis improved after given Ativan in ED. Being monitored on CIWA with low scores. 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 --Continue Plavix, Lipitor  COPD (chronic obstructive pulmonary disease) (HCC) Stable, not exacerbated. --Continue bronchodilators   Hypertension --Continue amlodipine --PRN hydralazine --Hold Benicar-HCTZ with AKI.  Fall at home, initial encounter Possibly occurred related to alcohol intoxication. Fall precautions PT/OT evaluations  Hyponatremia Na 129 on admission.  Started on IV fluids, felt hypovolemic etiology.  Beer potomania also in the differential --Continue IV fluids --Daily BMP to monitor --further evaluation if not improving as expected  AKI (acute kidney injury) (HCC) Likely due to dehydration and continuation of Benicar-HCTZ --Continue IV fluid  --Hold Benicar-HCTZ --Daily BMP to monitor  Bilateral knee pain Chronic, bilateral tricompartmental Osteoarthritis.  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 --Ortho consulted for right knee meniscus tear seen on MRI --PT  --Pain control PRN  HLD (hyperlipidemia) --Lipitor  Tobacco abuse Nicotine patch ordered Counseled on importance of cessations.  Obesity (BMI 30-39.9) Body mass index is 31.12 kg/m. Complicates overall care and prognosis.  Recommend lifestyle  modifications including physical activity and diet for weight loss and overall long-term health.   Complex tear of meniscus of right knee See Bilateral Knee Pain        Subjective: Pt awake sitting up in bed this AM.  He reports feeling okay.  No tremors or withdrawal symptoms.  He had me talk to his brother on his cell phone.  Brother takes care of him and their other brother and requests updates.  Pt denies acute complaints.  Recent CIWA scores 3 and 1.    Physical Exam: Vitals:   04/24/23 2039 04/25/23 0008 04/25/23 0519 04/25/23 0829  BP: 130/76 139/89 (!) 150/98 (!) 150/96  Pulse: 81 79 64 82  Resp: 18 19 19 16   Temp: 98.2 F (36.8 C) 98.2 F (36.8 C) 98 F (36.7 C) 98.3 F (36.8 C)  TempSrc:      SpO2: 97% 98% 98% 99%  Weight:      Height:       General exam: awake, alert, no acute distress HEENT: atraumatic, clear conjunctiva, anicteric sclera, moist mucus membranes, hearing grossly normal  Respiratory system: CTAB, no wheezes, rales or rhonchi, normal respiratory effort. Cardiovascular system: normal S1/S2, RRR, no JVD, murmurs, rubs, gallops,  no pedal edema.   Gastrointestinal system: soft, NT, ND, no HSM felt, +bowel sounds. Central nervous system: A&O x 2+. no gross focal neurologic deficits, normal speech Extremities: no tremor of outstretched hands, no edema, normal tone Skin: dry, intact, normal temperature Psychiatry: normal mood, congruent affect, judgement and insight appear normal   Data Reviewed:  Notable labs -- Na 129, K 3.3, Cl 96, glucose 110, Cr 1.79 from 1.63, Ca 8.7, Hbg 11.7  Family Communication: spoke with brother Dontae by phone this morning  Disposition: Status is: Inpatient Remains inpatient appropriate because: remains on IV  fluids for AKI and hyponatremia.  Ortho consult pending.   Planned Discharge Destination: Home    Time spent: 44 minutes  Author: Pennie Banter, DO 04/25/2023 2:33 PM  For on call review  [email or web address].

## 2023-04-25 NOTE — Assessment & Plan Note (Signed)
Nicotine patch ordered Counseled on importance of cessations.

## 2023-04-25 NOTE — Assessment & Plan Note (Signed)
Possibly occurred related to alcohol intoxication. Fall precautions PT/OT evaluations

## 2023-04-25 NOTE — Evaluation (Signed)
Occupational Therapy Evaluation Patient Details Name: James Lambert MRN: [id number] DOB: 03/01/1966 Today's Date: 04/25/2023   History of Present Illness KENN SCHNOOR is a 57 y.o. male with medical history significant of tobacco abuse, alcohol abuse, stroke with residual right-sided weakness, hypertension, hyperlipidemia, COPD, asthma, obesity, chronic bilateral knee pain, OSA not on CPAP, who presents with fall. R knee MRI 04/24/23: Extensive complex tearing/maceration of the lateral meniscus.   Clinical Impression   Mr Morita was seen for OT evaluation this date. Prior to hospital admission, pt was MOD I using RW as needed. Pt lives with and cares for disabled brother. Pt presents to acute OT demonstrating impaired ADL performance and functional mobility 2/2 pain and functional strength deficits. Pt currently requires MIN A sit<>stand at EOB to remove soiled pants, pt weightbearing through BUE and LLE in standing. RN in to address 10/10 R knee pain pain, new MRI findings reviewed, further mobility attempts deferred. MOD I don B shoes seated EOB, MIN A doff pants in standing. SETUP seated grooming and LB bathing in sitting. Pt would benefit from skilled OT to address noted impairments and functional limitations (see below for any additional details). Upon hospital discharge, recommend follow up therapy.    Recommendations for follow up therapy are one component of a multi-disciplinary discharge planning process, led by the attending physician.  Recommendations may be updated based on patient status, additional functional criteria and insurance authorization.   Assistance Recommended at Discharge Intermittent Supervision/Assistance  Patient can return home with the following A little help with walking and/or transfers;A little help with bathing/dressing/bathroom;Help with stairs or ramp for entrance    Functional Status Assessment  Patient has had a recent decline in their functional status and  demonstrates the ability to make significant improvements in function in a reasonable and predictable amount of time.  Equipment Recommendations  BSC/3in1;Wheelchair (measurements OT)    Recommendations for Other Services       Precautions / Restrictions Precautions Precautions: Fall Restrictions Weight Bearing Restrictions: No      Mobility Bed Mobility Overal bed mobility: Independent                  Transfers Overall transfer level: Needs assistance Equipment used: Rolling walker (2 wheels) Transfers: Sit to/from Stand Sit to Stand: Min assist                  Balance Overall balance assessment: Needs assistance Sitting-balance support: No upper extremity supported, Feet supported Sitting balance-Leahy Scale: Normal     Standing balance support: Single extremity supported Standing balance-Leahy Scale: Fair                             ADL either performed or assessed with clinical judgement   ADL Overall ADL's : Needs assistance/impaired                                       General ADL Comments: MOD I don B shoes seated EOB, MIN A doff pants in standing. SETUP seated grooming and LB bathing in sitting.      Pertinent Vitals/Pain Pain Assessment Pain Assessment: 0-10 Pain Score: 10-Worst pain ever Pain Location: R knee in standing Pain Descriptors / Indicators: Aching, Grimacing, Discomfort Pain Intervention(s): Limited activity within patient's tolerance, Repositioned, Patient requesting pain meds-RN notified     Hand Dominance  Right   Extremity/Trunk Assessment Upper Extremity Assessment Upper Extremity Assessment: Overall WFL for tasks assessed   Lower Extremity Assessment Lower Extremity Assessment: Generalized weakness       Communication Communication Communication: No difficulties   Cognition Arousal/Alertness: Awake/alert Behavior During Therapy: WFL for tasks assessed/performed,  Impulsive Overall Cognitive Status: Within Functional Limits for tasks assessed                                       General Comments       Exercises     Shoulder Instructions      Home Living Family/patient expects to be discharged to:: Private residence Living Arrangements: Other relatives   Type of Home: Apartment Home Access: Stairs to enter Secretary/administrator of Steps: 4 Entrance Stairs-Rails:  (middle, wobbly) Home Layout: One level     Bathroom Shower/Tub: Chief Strategy Officer: Standard     Home Equipment: BSC/3in1;Shower seat;Grab bars - tub/shower;Cane - single point;Rollator (4 wheels);Rolling Walker (2 wheels)   Additional Comments: pt cares for his brother      Prior Functioning/Environment Prior Level of Function : Needs assist             Mobility Comments: Use of RW in home ADLs Comments: Independent with ADL's        OT Problem List: Decreased strength;Decreased range of motion;Decreased activity tolerance;Impaired balance (sitting and/or standing);Decreased safety awareness;Pain      OT Treatment/Interventions: Self-care/ADL training;Therapeutic exercise;Energy conservation;DME and/or AE instruction;Therapeutic activities;Balance training;Patient/family education    OT Goals(Current goals can be found in the care plan section) Acute Rehab OT Goals Patient Stated Goal: to improve pain OT Goal Formulation: With patient Time For Goal Achievement: 05/09/23 Potential to Achieve Goals: Good ADL Goals Pt Will Perform Grooming: with modified independence;standing Pt Will Perform Lower Body Dressing: with modified independence;sit to/from stand Pt Will Transfer to Toilet: with modified independence;ambulating;regular height toilet  OT Frequency: Min 1X/week    Co-evaluation              AM-PAC OT "6 Clicks" Daily Activity     Outcome Measure Help from another person eating meals?: None Help from another  person taking care of personal grooming?: A Little Help from another person toileting, which includes using toliet, bedpan, or urinal?: A Little Help from another person bathing (including washing, rinsing, drying)?: A Lot Help from another person to put on and taking off regular upper body clothing?: None Help from another person to put on and taking off regular lower body clothing?: A Lot 6 Click Score: 18   End of Session Nurse Communication: Mobility status;Patient requests pain meds  Activity Tolerance: Patient tolerated treatment well Patient left: in bed;with call bell/phone within reach;with nursing/sitter in room  OT Visit Diagnosis: Other abnormalities of gait and mobility (R26.89);Muscle weakness (generalized) (M62.81)                Time: 4098-1191 OT Time Calculation (min): 26 min Charges:  OT General Charges $OT Visit: 1 Visit OT Evaluation $OT Eval Low Complexity: 1 Low OT Treatments $Self Care/Home Management : 8-22 mins  Kathie Dike, M.S. OTR/L  04/25/23, 10:56 AM  ascom [phone]

## 2023-04-25 NOTE — Assessment & Plan Note (Signed)
Likely due to dehydration and continuation of Benicar-HCTZ.  Cr improved on IV fluids. --Held Benicar-HCTZ for AKI - resume today --Continue metoprolol --Repeat BMP in 1-2 weeks with PCP

## 2023-04-25 NOTE — Progress Notes (Signed)
PT Cancellation Note  Patient Details Name: James Lambert MRN: [id number] DOB: Apr 18, 1966   Cancelled Treatment:    Reason Eval/Treat Not Completed: Medical issues which prohibited therapy (MRI revealed recent lateral meniscus tear from recent fall. Communicated findings with attending, who has since order an ortho consult. Awaiting ortho consult before attempting to peform evaluation.)   Johnn Hai 04/25/2023, 1:50 PM

## 2023-04-25 NOTE — Consult Note (Signed)
ORTHOPAEDIC CONSULTATION  REQUESTING PHYSICIAN: Pennie Banter, DO  Chief Complaint: Bilateral knee pain  HPI: James Lambert is a 57 y.o. male who complains of bilateral knee pain particularly anteriorly after a recent fall onto both knees.  Patient has had a previous stroke affecting his right side.  Patient states his left knee hurts more than his right.  He states after the fall he is having difficulty ambulating.  Orthopedics is consulted for knee pain.  Patient has had x-rays both knees and an MRI of the right knee.  Past Medical History:  Diagnosis Date   Alcohol abuse    Anemia    Aortic atherosclerosis (HCC)    Asthma    COPD (chronic obstructive pulmonary disease) (HCC)    CVA (cerebral vascular accident) (HCC) 05/10/2022   right side weakness   GERD (gastroesophageal reflux disease)    Hemiparesis affecting right side as late effect of stroke (HCC)    Hidradenitis suppurativa    diagnosed in [facility] ED based on history and physical exam   HLD (hyperlipidemia)    Hypertension    Marijuana use, continuous    Seizure cerebral (HCC)    Sleep apnea    does not use cpap   Tobacco use    Past Surgical History:  Procedure Laterality Date   INCISION AND DRAINAGE ABSCESS Bilateral 11/13/2022   Procedure: INCISION AND DRAINAGE ABSCESS bilateral buttocks & left groin;  Surgeon: Henrene Dodge, MD;  Location: [facility] ORS;  Service: General;  Laterality: Bilateral;   IR CT HEAD LTD  05/10/2022   IR FLUORO GUIDED NEEDLE PLC ASPIRATION/INJECTION LOC  03/20/2022   IR PERCUTANEOUS ART THROMBECTOMY/INFUSION INTRACRANIAL INC DIAG ANGIO  05/10/2022   IR US GUIDE VASC ACCESS RIGHT  05/10/2022   RADIOLOGY WITH ANESTHESIA N/A 05/10/2022   Procedure: IR WITH ANESTHESIA;  Surgeon: Julieanne Cotton, MD;  Location: [facility] OR;  Service: Radiology;  Laterality: N/A;   TEE WITHOUT CARDIOVERSION N/A 03/11/2022   Procedure: TRANSESOPHAGEAL ECHOCARDIOGRAM (TEE);  Surgeon: Lamar Blinks, MD;  Location: [facility]  ORS;  Service: Cardiovascular;  Laterality: N/A;   Social History   Socioeconomic History   Marital status: Single    Spouse name: Not on file   Number of children: Not on file   Years of education: Not on file   Highest education level: Not on file  Occupational History   Not on file  Tobacco Use   Smoking status: Every Day    Packs/day: 1.25    Years: 27.00    Additional pack years: 0.00    Total pack years: 33.75    Types: Cigarettes    Passive exposure: Past   Smokeless tobacco: Never   Tobacco comments:    Smoking 5-6 cigarettes per day  Vaping Use   Vaping Use: Never used  Substance and Sexual Activity   Alcohol use: Yes    Comment: last use 08/06/22, previous 2-3 40oz beer per day   Drug use: Yes    Frequency: 14.0 times per week    Types: Marijuana    Comment: 1-2 times per day   Sexual activity: Not on file  Other Topics Concern   Not on file  Social History Narrative   Not on file   Social Determinants of Health   Financial Resource Strain: High Risk (03/09/2023)   Overall Financial Resource Strain (CARDIA)    Difficulty of Paying Living Expenses: Hard  Food Insecurity: No Food Insecurity (04/24/2023)   Hunger Vital Sign  Worried About Programme researcher, broadcasting/film/video in the Last Year: Never true    Ran Out of Food in the Last Year: Never true  Transportation Needs: No Transportation Needs (04/24/2023)   PRAPARE - Administrator, Civil Service (Medical): No    Lack of Transportation (Non-Medical): No  Physical Activity: Insufficiently Active (09/10/2022)   Exercise Vital Sign    Days of Exercise per Week: 3 days    Minutes of Exercise per Session: 30 min  Stress: No Stress Concern Present (02/25/2023)   [employer] of Occupational Health - Occupational Stress Questionnaire    Feeling of Stress : Only a little  Social Connections: Socially Isolated (09/10/2022)   Social Connection and Isolation Panel [NHANES]    Frequency of Communication with  Friends and Family: Three times a week    Frequency of Social Gatherings with Friends and Family: Three times a week    Attends Religious Services: Never    Active Member of Clubs or Organizations: No    Attends Banker Meetings: Never    Marital Status: Separated   Family History  Problem Relation Age of Onset   Diabetes Mother    Alzheimer's disease Mother    Hypertension Father    Bone cancer Father    Diabetes Brother    Hypertension Brother    Other Maternal Grandmother        unknown medical history   Other Maternal Grandfather        unknown medical history   Other Paternal Grandmother        unknown medical history   Other Paternal Grandfather        unknown medical history   No Known Allergies Prior to Admission medications   Medication Sig Start Date End Date Taking? Authorizing Provider  acetaminophen (TYLENOL) 325 MG tablet Take 2 tablets (650 mg total) by mouth every 6 (six) hours as needed for mild pain. 09/10/22  Yes Cannady, Jolene T, NP  albuterol (VENTOLIN HFA) 108 (90 Base) MCG/ACT inhaler Inhale 1-2 puffs into the lungs every 6 (six) hours as needed for wheezing or shortness of breath. 06/24/22  Yes Iloabachie, Chioma E, NP  amLODipine (NORVASC) 10 MG tablet Take 1 tablet (10 mg total) by mouth daily. 04/13/23  Yes Cannady, Jolene T, NP  atorvastatin (LIPITOR) 80 MG tablet Take 1 tablet (80 mg total) by mouth daily. 04/13/23  Yes Cannady, Corrie Dandy T, NP  clopidogrel (PLAVIX) 75 MG tablet Take 1 tablet (75 mg total) by mouth daily. 04/13/23  Yes Cannady, Corrie Dandy T, NP  metoprolol succinate (TOPROL-XL) 50 MG 24 hr tablet Take 1 tablet (50 mg total) by mouth daily with or immediately following a meal. 04/13/23  Yes Cannady, Jolene T, NP  Multiple Vitamin (MULTIVITAMIN WITH MINERALS) TABS tablet Take 1 tablet by mouth daily. 05/27/22  Yes Setzer, Lynnell Jude, PA-C  olmesartan-hydrochlorothiazide (BENICAR HCT) 40-12.5 MG tablet Take 1 tablet by mouth daily. 04/13/23  Yes  Cannady, Jolene T, NP  pantoprazole (PROTONIX) 40 MG tablet Take 1 tablet (40 mg total) by mouth at bedtime. 04/13/23  Yes Cannady, Jolene T, NP  thiamine (VITAMIN B-1) 100 MG tablet Take 1 tablet (100 mg total) by mouth daily. 04/13/23  Yes Cannady, Corrie Dandy T, NP  umeclidinium-vilanterol (ANORO ELLIPTA) 62.5-25 MCG/ACT AEPB Inhale 1 puff into the lungs daily at 6 (six) AM. 04/15/23  Yes Marjie Skiff, NP   MR KNEE RIGHT W WO CONTRAST  Result Date: 04/25/2023 CLINICAL DATA:  Knee  pain.  Abnormal x-ray.  Possible bone lesion. EXAM: MRI OF THE RIGHT KNEE WITHOUT AND WITH CONTRAST TECHNIQUE: Multiplanar, multisequence MR imaging of the right was performed both before and after administration of intravenous contrast. CONTRAST:  10mL GADAVIST GADOBUTROL 1 MMOL/ML IV SOLN COMPARISON:  X-ray 04/24/2023, 12/21/2020 FINDINGS: MENISCI Medial meniscus: Intrasubstance degeneration with complex tearing of the medial meniscal body and posterior horn. Lateral meniscus: Extensive complex tearing/maceration of the lateral meniscus. There is extrusion of the lateral meniscal body. LIGAMENTS Cruciates: Intact ACL and PCL.  Mucoid degeneration of the ACL. Collaterals: Intact MCL. Lateral collateral ligament complex intact. CARTILAGE Patellofemoral: High-grade cartilage loss of the lateral patellar facet and lateral trochlea. Medial: Mild chondral thinning and surface irregularity of the weight-bearing medial compartment. Lateral: Extensive full-thickness cartilage loss of the articular cartilage of the lateral compartment with some osseous remodeling. Extensive subchondral cyst formation along both sides of the joint. MISCELLANEOUS Joint: Small joint effusion. Several small scattered intra-articular loose bodies. 11 x 9 mm parameniscal cyst or ganglion along the anterior margin of the lateral tibial plateau. Fat pads within normal limits. Popliteal Fossa:  No Baker's cyst. Intact popliteus tendon. Extensor Mechanism: Intact  quadriceps and patellar tendons. Mild patellar tendinosis. Bones: Tricompartmental joint space narrowing with bulky marginal osteophyte formation, most pronounced within the lateral compartment. Extensive subchondral marrow edema within the lateral compartment, likely reactive. 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 No associated marrow edema or periostitis. No marrow replacing bone lesion. Other: Multiple superficial varicosities are present. No significant periarticular soft tissue findings. IMPRESSION: 1. 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 No suspicious bone lesion. 2. 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 3. Extensive complex tearing/maceration of the lateral meniscus. 4. Degenerative tearing of the medial meniscus. Electronically Signed   By: Duanne Guess D.O.   On: 04/25/2023 09:30   CT HEAD WO CONTRAST ( )  Result Date: 04/24/2023 CLINICAL DATA:  Trauma EXAM: CT HEAD WITHOUT CONTRAST TECHNIQUE: Contiguous axial images were obtained from the base of the skull through the vertex without intravenous contrast. RADIATION DOSE REDUCTION: This exam was performed according to the departmental dose-optimization program which includes automated exposure control, adjustment of the mA and/or kV according to patient size and/or use of iterative reconstruction technique. COMPARISON:  CT head 02/04/2023 FINDINGS: Brain: No evidence of acute infarction, hemorrhage, hydrocephalus, extra-axial collection or mass lesion/mass effect. Chronic left parietal lobe and left frontal lobe infarct. There is also a chronic left cerebellar infarct. Sequela of mild overall microvascular ischemic change. Vascular: No hyperdense vessel or unexpected  calcification. Skull: Normal. Negative for fracture or focal lesion. Sinuses/Orbits: No middle ear or mastoid effusion. Paranasal sinuses are notable for mucosal thickening in the right maxillary sinus. Orbits are unremarkable. Other: None. IMPRESSION: No CT evidence of intracranial injury. Electronically Signed   By: Lorenza Cambridge M.D.   On: 04/24/2023 14:53   DG Chest Portable 1 View  Result Date: 04/24/2023 CLINICAL DATA:  Status post fall EXAM: PORTABLE CHEST 1 VIEW COMPARISON:  Chest radiograph dated 03/18/2023 FINDINGS: Normal lung volumes. No focal consolidations. No pleural effusion or pneumothorax. The heart size and mediastinal contours are within normal limits. Unchanged heterotopic ossification along the inferior distal right clavicle. Old left rib No radiographic finding of acute displaced fracture. deformities. IMPRESSION: No radiographic finding of acute displaced fracture. Electronically  Signed   By: Agustin Cree M.D.   On: 04/24/2023 14:47   DG Knee Complete 4 Views Left  Result Date: 04/24/2023 CLINICAL DATA:  Knee pain EXAM: LEFT KNEE - COMPLETE 4+ VIEW; RIGHT KNEE - COMPLETE 4+ VIEW COMPARISON:  None available FINDINGS: Right knee: Moderate joint space loss and spurring of the medial and lateral compartments. Mild spurring and moderate joint space loss in the patellofemoral compartment. Osseous exostosis extending medially from the fibular neck measures 2.0 x 1.7 cm. No fracture or dislocation. Atherosclerotic changes seen throughout visualized arterial segments. Left knee: Moderate to severe joint space loss and spurring of the lateral compartment. Mild spurring of the patellofemoral and medial compartments. Small knee joint effusion. Atherosclerotic changes seen throughout visualized arterial segments. IMPRESSION: 1. No acute fracture or dislocation of the knees. 2. Small left knee joint effusion. 3. Bilateral tricompartmental osteoarthrosis as discussed above. 4. 2.0 x 1.7 cm exostosis  extending medially from the right fibular neck is suspicious for osteochondroma and given patient's history of pain, should be further evaluated with contrast-enhanced MRI. Electronically Signed   By: Acquanetta Belling M.D.   On: 04/24/2023 14:36   DG Knee Complete 4 Views Right  Result Date: 04/24/2023 CLINICAL DATA:  Knee pain EXAM: LEFT KNEE - COMPLETE 4+ VIEW; RIGHT KNEE - COMPLETE 4+ VIEW COMPARISON:  None available FINDINGS: Right knee: Moderate joint space loss and spurring of the medial and lateral compartments. Mild spurring and moderate joint space loss in the patellofemoral compartment. Osseous exostosis extending medially from the fibular neck measures 2.0 x 1.7 cm. No fracture or dislocation. Atherosclerotic changes seen throughout visualized arterial segments. Left knee: Moderate to severe joint space loss and spurring of the lateral compartment. Mild spurring of the patellofemoral and medial compartments. Small knee joint effusion. Atherosclerotic changes seen throughout visualized arterial segments. IMPRESSION: 1. No acute fracture or dislocation of the knees. 2. Small left knee joint effusion. 3. Bilateral tricompartmental osteoarthrosis as discussed above. 4. 2.0 x 1.7 cm exostosis extending medially from the right fibular neck is suspicious for osteochondroma and given patient's history of pain, should be further evaluated with contrast-enhanced MRI. Electronically Signed   By: Acquanetta Belling M.D.   On: 04/24/2023 14:36    Positive ROS: All other systems have been reviewed and were otherwise negative with the exception of those mentioned in the HPI and as above.  Physical Exam: General: Alert, no acute distress  MUSCULOSKELETAL: Lateral knees: Patient's skin is intact overlying both knees.  He has a trace effusion bilaterally.  He can actively flex and extend his knees with mild to moderate pain.  Distally is neurovascular intact.  His compartments are soft and compressible.  He has no  ligamentous laxity.  The tensor mechanism of both knees appears to be intact.  Assessment: Bilateral anterior knee pain status post fall secondary to contusion versus flareup of underlying osteoarthritis  Plan: I explained Mr. Doring that his x-ray films have not shown any evidence of fracture despite his recent falls.  The patient was noted to have and abnormality of the right proximal fibula on his plain films and an MRI was ordered.  The abnormality, by MRI, is consistent with a chronic proximal tibia-fibula synostosis.  The MRI shows tricompartmental osteoarthritis with severe lateral chondromalacia.  Patient has a degenerative lateral meniscus tear seen as well.    The patient's bilateral knee pain is secondary to either contusion versus flareup of underlying osteoarthritis of both knees.  There is no surgical  intervention necessary for his lateral meniscus tear seen on the MRI given the advanced arthritis in his knee.  I would recommend nonsteroidal anti-inflammatory medication versus an oral steroid taper for treatment of bilateral knee osteoarthritis.  I would recommend physical therapy evaluation and treatment as well to assist the patient with increasing his ability to ambulate.   Juanell Fairly, MD    04/25/2023 5:32 PM

## 2023-04-25 NOTE — Assessment & Plan Note (Signed)
See Bilateral Knee Pain

## 2023-04-25 NOTE — Assessment & Plan Note (Addendum)
Na 129 on admission.  Started on IV fluids, felt hypovolemic etiology.  Beer potomania also in the differential. Na improved. BMP in 1-2 weeks at PCP follow up

## 2023-04-25 NOTE — Assessment & Plan Note (Addendum)
--  Continue home medications at d/c --Close PCP follow up

## 2023-04-25 NOTE — Assessment & Plan Note (Signed)
Chronic, bilateral tricompartmental Osteoarthritis.  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 --Ortho consulted for right knee meniscus tear seen on MRI --PT  --Pain control PRN

## 2023-04-25 NOTE — Assessment & Plan Note (Signed)
Body mass index is 31.12 kg/m. Complicates overall care and prognosis.  Recommend lifestyle modifications including physical activity and diet for weight loss and overall long-term health.

## 2023-04-25 NOTE — Plan of Care (Signed)
  Problem: Health Behavior/Discharge Planning: Goal: Ability to manage health-related needs will improve Outcome: Progressing   Problem: Clinical Measurements: Goal: Ability to maintain clinical measurements within normal limits will improve Outcome: Progressing Goal: Cardiovascular complication will be avoided Outcome: Progressing   Problem: Safety: Goal: Ability to remain free from injury will improve Outcome: Progressing   

## 2023-04-25 NOTE — Assessment & Plan Note (Signed)
With residual right-sided weakness, minimal. --Continue Plavix, Lipitor

## 2023-04-26 DIAGNOSIS — E871 Hypo-osmolality and hyponatremia: Secondary | ICD-10-CM | POA: Diagnosis not present

## 2023-04-26 DIAGNOSIS — M25562 Pain in left knee: Secondary | ICD-10-CM | POA: Diagnosis not present

## 2023-04-26 DIAGNOSIS — M25561 Pain in right knee: Secondary | ICD-10-CM | POA: Diagnosis not present

## 2023-04-26 DIAGNOSIS — N179 Acute kidney failure, unspecified: Secondary | ICD-10-CM | POA: Diagnosis not present

## 2023-04-26 LAB — BASIC METABOLIC PANEL
Anion gap: 7 (ref 5–15)
BUN: 25 mg/dL — ABNORMAL HIGH (ref 6–20)
CO2: 24 mmol/L (ref 22–32)
Calcium: 8.7 mg/dL — ABNORMAL LOW (ref 8.9–10.3)
Chloride: 103 mmol/L (ref 98–111)
Creatinine, Ser: 1.43 mg/dL — ABNORMAL HIGH (ref 0.61–1.24)
GFR, Estimated: 58 mL/min — ABNORMAL LOW (ref >60)
Glucose, Bld: 112 mg/dL — ABNORMAL HIGH (ref 70–99)
Potassium: 3.9 mmol/L (ref 3.5–5.1)
Sodium: 134 mmol/L — ABNORMAL LOW (ref 135–145)

## 2023-04-26 LAB — PHOSPHORUS: Phosphorus: 3.4 mg/dL (ref 2.5–4.6)

## 2023-04-26 LAB — MAGNESIUM: Magnesium: 1.7 mg/dL (ref 1.7–2.4)

## 2023-04-26 MED ORDER — HYDRALAZINE HCL 25 MG PO TABS
25.0000 mg | ORAL_TABLET | Freq: Four times a day (QID) | ORAL | Status: DC | PRN
  Administered 2023-04-26: 25 mg via ORAL
  Filled 2023-04-26: qty 1

## 2023-04-26 MED ORDER — PREDNISONE 20 MG PO TABS
40.0000 mg | ORAL_TABLET | Freq: Every day | ORAL | Status: DC
  Administered 2023-04-26 – 2023-04-27 (×2): 40 mg via ORAL
  Filled 2023-04-26 (×2): qty 2

## 2023-04-26 NOTE — Progress Notes (Signed)
Physical Therapy Treatment Patient Details Name: James Lambert MRN: [id number] DOB: [birth year] Today's Date: 04/26/2023   History of Present Illness James Lambert is a 57 y.o. male with medical history significant of tobacco abuse, alcohol abuse, stroke with residual right-sided weakness, hypertension, hyperlipidemia, COPD, asthma, obesity, chronic bilateral knee pain, OSA not on CPAP, who presents with fall. R knee MRI 04/24/23: Extensive complex tearing/maceration of the lateral meniscus.    PT Comments    The pt presents this session with urgent need to use the restroom. He continues to present with gait deficits as listed below. When asked the pt reports that his current gait deviations are his baseline. He also states that he ambulated with his brother's RW at baseline. Prior to admission pt required increased assistance of handrail in order to navigate steps. Would highly recommend stair negotiation training with appropriate AD prior to d/c as the pt is presenting as high fall risk. Pt would greatly benefit from HHPT in order to optimize functional mobility at this time.    Recommendations for follow up therapy are one component of a multi-disciplinary discharge planning process, led by the attending physician.  Recommendations may be updated based on patient status, additional functional criteria and insurance authorization.  Follow Up Recommendations       Assistance Recommended at Discharge Set up Supervision/Assistance  Patient can return home with the following A little help with walking and/or transfers;A little help with bathing/dressing/bathroom;Help with stairs or ramp for entrance   Equipment Recommendations  Rolling walker (2 wheels);BSC/3in1    Recommendations for Other Services       Precautions / Restrictions Precautions Precautions: Fall     Mobility  Bed Mobility Overal bed mobility: Independent                  Transfers Overall transfer level:  Needs assistance Equipment used: Rolling walker (2 wheels) Transfers: Sit to/from Stand Sit to Stand: From elevated surface                Ambulation/Gait Ambulation/Gait assistance: Min assist Gait Distance (Feet): 30 Feet Assistive device: Rolling walker (2 wheels) Gait Pattern/deviations: Decreased weight shift to right, Decreased stride length       General Gait Details: Right valgus knee deformity   Stairs             Wheelchair Mobility    Modified Rankin (Stroke Patients Only)       Balance Overall balance assessment: Needs assistance Sitting-balance support: Feet supported Sitting balance-Leahy Scale: Normal     Standing balance support: During functional activity Standing balance-Leahy Scale: Fair                              Cognition Arousal/Alertness: Awake/alert Behavior During Therapy: WFL for tasks assessed/performed Overall Cognitive Status: Within Functional Limits for tasks assessed                                          Exercises      General Comments        Pertinent Vitals/Pain Pain Assessment Pain Assessment: No/denies pain Pain Score: 0-No pain Pain Location: Pt reporting no pain at rest    Home Living Family/patient expects to be discharged to:: Private residence Living Arrangements: Other relatives Available Help at Discharge: Friend(s);Available PRN/intermittently Type of Home: Apartment  Home Access: Stairs to enter Entrance Stairs-Rails:  (One middle handrail) Entrance Stairs-Number of Steps: 5   Home Layout: One level Home Equipment: Rollator (4 wheels);Gilmer Mor - single point (Belongs to brother) Additional Comments: pt cares for his brother; 5/19 pt reports that they do not have a shower seat any longer.    Prior Function            PT Goals (current goals can now be found in the care plan section) Acute Rehab PT Goals Patient Stated Goal: Return home to be caregiver for  brother PT Goal Formulation: With patient Time For Goal Achievement: 05/03/23 Potential to Achieve Goals: Good Progress towards PT goals: Progressing toward goals    Frequency    Min 3X/week      PT Plan Current plan remains appropriate    Co-evaluation              AM-PAC PT "6 Clicks" Mobility   Outcome Measure  Help needed turning from your back to your side while in a flat bed without using bedrails?: None Help needed moving from lying on your back to sitting on the side of a flat bed without using bedrails?: None Help needed moving to and from a bed to a chair (including a wheelchair)?: A Little Help needed standing up from a chair using your arms (e.g., wheelchair or bedside chair)?: A Little Help needed to walk in hospital room?: A Little Help needed climbing 3-5 steps with a railing? : A Lot 6 Click Score: 19    End of Session   Activity Tolerance: Patient tolerated treatment well Patient left: in bed;with nursing/sitter in room Nurse Communication: Mobility status PT Visit Diagnosis: Difficulty in walking, not elsewhere classified (R26.2);Unsteadiness on feet (R26.81)     Time: 1335-1350 PT Time Calculation (min) (ACUTE ONLY): 15 min  Charges:  $Gait Training: 8-22 mins                     1:56 PM, 04/26/23 Raya Mckinstry A. Mordecai Maes PT, DPT Physical Therapist - [facility]    Demarrio Menges A Fayola Meckes 04/26/2023, 1:54 PM

## 2023-04-26 NOTE — Plan of Care (Signed)
  Problem: Clinical Measurements: Goal: Ability to maintain clinical measurements within normal limits will improve Outcome: Progressing   Problem: Activity: Goal: Risk for activity intolerance will decrease Outcome: Progressing   Problem: Pain Managment: Goal: General experience of comfort will improve Outcome: Progressing   Problem: Safety: Goal: Ability to remain free from injury will improve Outcome: Progressing   Problem: Skin Integrity: Goal: Risk for impaired skin integrity will decrease Outcome: Progressing   

## 2023-04-26 NOTE — Progress Notes (Signed)
Subjective:  Patient up out of bed to a chair today.  He states his bilateral knee pain is improved.  Objective:   VITALS:   Vitals:   04/25/23 0519 04/25/23 0829 04/25/23 2346 04/26/23 1030  BP: (!) 150/98 (!) 150/96 131/85 (!) 163/109  Pulse: 64 82 76 72  Resp: 19 16 18 18   Temp: 98 F (36.7 C) 98.3 F (36.8 C) 98.2 F (36.8 C) 98.6 F (37 C)  TempSrc:   Oral Oral  SpO2: 98% 99% 98% 100%  Weight:      Height:        PHYSICAL EXAM: Bilateral lower extremity: No erythema ecchymosis or large effusions. Neurovascular intact Sensation intact distally Intact pulses distally Dorsiflexion/Plantar flexion intact No cellulitis present Compartment soft  LABS  Results for orders placed or performed during the hospital encounter of 04/24/23 (from the past 24 hour(s))  Basic metabolic panel     Status: Abnormal   Collection Time: 04/26/23  5:12 AM  Result Value Ref Range   Sodium 134 (L) 135 - 145 mmol/L   Potassium 3.9 3.5 - 5.1 mmol/L   Chloride 103 98 - 111 mmol/L   CO2 24 22 - 32 mmol/L   Glucose, Bld 112 (H) 70 - 99 mg/dL   BUN 25 (H) 6 - 20 mg/dL   Creatinine, Ser 0.45 (H) 0.61 - 1.24 mg/dL   Calcium 8.7 (L) 8.9 - 10.3 mg/dL   GFR, Estimated 58 (L) >60 mL/min   Anion gap 7 5 - 15  Magnesium     Status: None   Collection Time: 04/26/23  5:12 AM  Result Value Ref Range   Magnesium 1.7 1.7 - 2.4 mg/dL  Phosphorus     Status: None   Collection Time: 04/26/23  5:12 AM  Result Value Ref Range   Phosphorus 3.4 2.5 - 4.6 mg/dL    MR KNEE RIGHT W WO CONTRAST  Result Date: 04/25/2023 CLINICAL DATA:  Knee pain.  Abnormal x-ray.  Possible bone lesion. EXAM: MRI OF THE RIGHT KNEE WITHOUT AND WITH CONTRAST TECHNIQUE: Multiplanar, multisequence MR imaging of the right was performed both before and after administration of intravenous contrast. CONTRAST:  10mL GADAVIST GADOBUTROL 1 MMOL/ML IV SOLN COMPARISON:  X-ray 04/24/2023, 12/21/2020 FINDINGS: MENISCI Medial meniscus:  Intrasubstance degeneration with complex tearing of the medial meniscal body and posterior horn. Lateral meniscus: Extensive complex tearing/maceration of the lateral meniscus. There is extrusion of the lateral meniscal body. LIGAMENTS Cruciates: Intact ACL and PCL.  Mucoid degeneration of the ACL. Collaterals: Intact MCL. Lateral collateral ligament complex intact. CARTILAGE Patellofemoral: High-grade cartilage loss of the lateral patellar facet and lateral trochlea. Medial: Mild chondral thinning and surface irregularity of the weight-bearing medial compartment. Lateral: Extensive full-thickness cartilage loss of the articular cartilage of the lateral compartment with some osseous remodeling. Extensive subchondral cyst formation along both sides of the joint. MISCELLANEOUS Joint: Small joint effusion. Several small scattered intra-articular loose bodies. 11 x 9 mm parameniscal cyst or ganglion along the anterior margin of the lateral tibial plateau. Fat pads within normal limits. Popliteal Fossa:  No Baker's cyst. Intact popliteus tendon. Extensor Mechanism: Intact quadriceps and patellar tendons. Mild patellar tendinosis. Bones: Tricompartmental joint space narrowing with bulky marginal osteophyte formation, most pronounced within the lateral compartment. Extensive subchondral marrow edema within the lateral compartment, likely reactive. 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 No associated marrow edema or periostitis. No marrow replacing bone lesion. Other: Multiple  superficial varicosities are present. No significant periarticular soft tissue findings. IMPRESSION: 1. 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 No suspicious bone lesion. 2. 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 3. Extensive complex tearing/maceration of the lateral meniscus. 4. Degenerative tearing of the medial meniscus. Electronically Signed   By: Duanne Guess D.O.   On: 04/25/2023 09:30   CT HEAD WO CONTRAST ( )  Result Date: 04/24/2023 CLINICAL DATA:  Trauma EXAM: CT HEAD WITHOUT CONTRAST TECHNIQUE: Contiguous axial images were obtained from the base of the skull through the vertex without intravenous contrast. RADIATION DOSE REDUCTION: This exam was performed according to the departmental dose-optimization program which includes automated exposure control, adjustment of the mA and/or kV according to patient size and/or use of iterative reconstruction technique. COMPARISON:  CT head 02/04/2023 FINDINGS: Brain: No evidence of acute infarction, hemorrhage, hydrocephalus, extra-axial collection or mass lesion/mass effect. Chronic left parietal lobe and left frontal lobe infarct. There is also a chronic left cerebellar infarct. Sequela of mild overall microvascular ischemic change. Vascular: No hyperdense vessel or unexpected calcification. Skull: Normal. Negative for fracture or focal lesion. Sinuses/Orbits: No middle ear or mastoid effusion. Paranasal sinuses are notable for mucosal thickening in the right maxillary sinus. Orbits are unremarkable. Other: None. IMPRESSION: No CT evidence of intracranial injury. Electronically Signed   By: Lorenza Cambridge M.D.   On: 04/24/2023 14:53   DG Chest Portable 1 View  Result Date: 04/24/2023 CLINICAL DATA:  Status post fall EXAM: PORTABLE CHEST 1 VIEW COMPARISON:  Chest radiograph dated 03/18/2023 FINDINGS: Normal lung volumes. No focal consolidations. No pleural effusion or pneumothorax. The heart size and mediastinal contours are within normal limits. Unchanged heterotopic ossification along the inferior distal right clavicle. Old left rib No radiographic finding of acute displaced fracture. deformities. IMPRESSION: No  radiographic finding of acute displaced fracture. Electronically Signed   By: Agustin Cree M.D.   On: 04/24/2023 14:47   DG Knee Complete 4 Views Left  Result Date: 04/24/2023 CLINICAL DATA:  Knee pain EXAM: LEFT KNEE - COMPLETE 4+ VIEW; RIGHT KNEE - COMPLETE 4+ VIEW COMPARISON:  None available FINDINGS: Right knee: Moderate joint space loss and spurring of the medial and lateral compartments. Mild spurring and moderate joint space loss in the patellofemoral compartment. Osseous exostosis extending medially from the fibular neck measures 2.0 x 1.7 cm. No fracture or dislocation. Atherosclerotic changes seen throughout visualized arterial segments. Left knee: Moderate to severe joint space loss and spurring of the lateral compartment. Mild spurring of the patellofemoral and medial compartments. Small knee joint effusion. Atherosclerotic changes seen throughout visualized arterial segments. IMPRESSION: 1. No acute fracture or dislocation of the knees. 2. Small left knee joint effusion. 3. Bilateral tricompartmental osteoarthrosis as discussed above. 4. 2.0 x 1.7 cm exostosis extending medially from the right fibular neck is suspicious for osteochondroma and given patient's history of pain, should be further evaluated with contrast-enhanced MRI. Electronically Signed   By: Acquanetta Belling M.D.   On: 04/24/2023 14:36   DG Knee Complete 4 Views Right  Result Date: 04/24/2023 CLINICAL DATA:  Knee pain EXAM: LEFT KNEE - COMPLETE 4+ VIEW; RIGHT KNEE - COMPLETE 4+ VIEW COMPARISON:  None available FINDINGS: Right knee: Moderate joint space loss and spurring of the medial and lateral compartments. Mild spurring and moderate joint space loss in the patellofemoral compartment. Osseous exostosis extending medially from the  fibular neck measures 2.0 x 1.7 cm. No fracture or dislocation. Atherosclerotic changes seen throughout visualized arterial segments. Left knee: Moderate to severe joint space loss and spurring of the lateral  compartment. Mild spurring of the patellofemoral and medial compartments. Small knee joint effusion. Atherosclerotic changes seen throughout visualized arterial segments. IMPRESSION: 1. No acute fracture or dislocation of the knees. 2. Small left knee joint effusion. 3. Bilateral tricompartmental osteoarthrosis as discussed above. 4. 2.0 x 1.7 cm exostosis extending medially from the right fibular neck is suspicious for osteochondroma and given patient's history of pain, should be further evaluated with contrast-enhanced MRI. Electronically Signed   By: Acquanetta Belling M.D.   On: 04/24/2023 14:36    Assessment/Plan:     Principal Problem:   Alcohol withdrawal (HCC) Active Problems:   History of CVA (cerebrovascular accident)   Hypertension   COPD (chronic obstructive pulmonary disease) (HCC)   AKI (acute kidney injury) (HCC)   Fall at home, initial encounter   Hyponatremia   Bilateral knee pain   HLD (hyperlipidemia)   Obesity (BMI 30-39.9)   Tobacco abuse   Complex tear of meniscus of right knee  Bilateral knee pain status post fall secondary to contusion versus flareup of underlying osteoarthritis  Recommend considering an oral steroid taper if not medically contraindicated to reduce inflammation.  Continue physical and Occupational Therapy.  Patient currently on Plavix and Lovenox.  Ice and elevate lower extremities as needed.    Juanell Fairly , MD 04/26/2023, 12:44 PM

## 2023-04-26 NOTE — Progress Notes (Addendum)
Progress Note   Patient: James Lambert JWJ:[id number] DOB: 09-03-1966 DOA: 04/24/2023     1 DOS: the patient was seen and examined on 04/26/2023   Brief hospital course: No notes on file  Assessment and Plan: * Alcohol withdrawal (HCC) On admission, pt noted to have diaphoresis improved after given Ativan in ED. Being monitored on CIWA with low scores. 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 --Continue Plavix, Lipitor  COPD (chronic obstructive pulmonary disease) (HCC) Stable, not exacerbated. --Continue bronchodilators   Hypertension --Continue amlodipine --PRN hydralazine --Hold Benicar-HCTZ with AKI.  Fall at home, initial encounter Possibly occurred related to alcohol intoxication. Fall precautions PT/OT evaluations  Hyponatremia Na 129 on admission.  Started on IV fluids, felt hypovolemic etiology.  Beer potomania also in the differential Na improved to 134. --Continue IV fluids for AKI --Daily BMP to monitor --further evaluation if not improving as expected  AKI (acute kidney injury) (HCC) Likely due to dehydration and continuation of Benicar-HCTZ Cr improving on IV fluids, remains above baseline --Continue IV fluids --Hold Benicar-HCTZ --Daily BMP to monitor  Bilateral knee pain Chronic, bilateral tricompartmental Osteoarthritis.  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 NSAId's contraindicated with AKI currently --Start Prednisone 40 mg daily & discharge on taper --No indication for surgical intervention --PT recommends HH PT --Pain control PRN --Outpatient ortho  follow up to consider injections  HLD (hyperlipidemia) --Lipitor  Tobacco abuse Nicotine patch ordered Counseled on importance of cessations.  Obesity (BMI 30-39.9) Body mass index is 31.12 kg/m. Complicates overall care and prognosis.  Recommend lifestyle modifications including physical activity and diet for weight loss and overall long-term health.   Complex tear of meniscus of right knee See Bilateral Knee Pain        Subjective: Pt awake sitting up in bed this AM.  He reports ongoing knee pain worse on right, but otherwise says he feels better.  Reports a headache this AM, recently given Tylenol.  No tremors or other withdrawal symptoms.    Physical Exam: Vitals:   04/25/23 2346 04/26/23 1030 04/26/23 1300 04/26/23 1349  BP: 131/85 (!) 163/109 (!) 167/96 139/85  Pulse: 76 72 60 69  Resp: 18 18    Temp: 98.2 F (36.8 C) 98.6 F (37 C)    TempSrc: Oral Oral    SpO2: 98% 100%    Weight:      Height:       General exam: awake, alert, no acute distress HEENT: atraumatic, clear conjunctiva, anicteric sclera, moist mucus membranes, hearing grossly normal  Respiratory system: CTAB, no wheezes, rales or rhonchi, normal respiratory effort. Cardiovascular system: normal S1/S2, RRR, no JVD, murmurs, rubs, gallops,  no pedal edema.   Gastrointestinal system: soft, NT, ND, no HSM felt, +bowel sounds. Central nervous system: A&O x 2+. no gross focal neurologic deficits, normal speech Extremities: no tremor of outstretched hands, no edema, normal tone Skin: dry, intact, normal temperature Psychiatry: normal mood, congruent affect, judgement and insight appear normal   Data Reviewed:  Notable labs -- Na 129 >> 134, K 3.3 >> 3.9, glucose 112, Cr 1.63 >> 1.79 >> 1.43, Ca 8.7  Family Communication: spoke with  brother James Lambert by phone this afternoon  Disposition: Status is: Inpatient Remains inpatient appropriate because: remains on IV fluids for AKI.  Anticipate d/c  tomorrow 5/20.   Planned Discharge Destination: Home    Time spent: 35 minutes  Author: Pennie Banter, DO 04/26/2023 2:42 PM  For on call review [email or web address].

## 2023-04-26 NOTE — Evaluation (Signed)
Physical Therapy Evaluation Patient Details Name: James Lambert MRN: [id number] DOB: [birth year] Today's Date: 04/26/2023  History of Present Illness  James Lambert is a 57 y.o. male with medical history significant of tobacco abuse, alcohol abuse, stroke with residual right-sided weakness, hypertension, hyperlipidemia, COPD, asthma, obesity, chronic bilateral knee pain, OSA not on CPAP, who presents with fall. R knee MRI 04/24/23: Extensive complex tearing/maceration of the lateral meniscus.  Clinical Impression  The pt presents this session with no reports of pain. He states that he only has pain when he stands, but has not ambulated as of yet. The pt reports no increased pain in standing this session and no signficant pain when ambulating to bedside chair. The pt presents with impaired left lateral weightshifting and subsequent decreased right step length. The pt is expected to progress well with PT to d/c home with intermittent friend/family care within 2-3 PT sessions. The greatest barrier to d/c at this time is stair negotiation. PT will continue to follow.        Recommendations for follow up therapy are one component of a multi-disciplinary discharge planning process, led by the attending physician.  Recommendations may be updated based on patient status, additional functional criteria and insurance authorization.  Follow Up Recommendations       Assistance Recommended at Discharge Set up Supervision/Assistance  Patient can return home with the following  A little help with walking and/or transfers;A little help with bathing/dressing/bathroom;Help with stairs or ramp for entrance    Equipment Recommendations  (Will continue to assess)  Recommendations for Other Services       Functional Status Assessment Patient has had a recent decline in their functional status and demonstrates the ability to make significant improvements in function in a reasonable and predictable amount of time.      Precautions / Restrictions Precautions Precautions: Fall      Mobility  Bed Mobility Overal bed mobility: Independent                  Transfers Overall transfer level: Needs assistance Equipment used: Rolling walker (2 wheels) Transfers: Sit to/from Stand Sit to Stand: Supervision                Ambulation/Gait Ambulation/Gait assistance: Min assist Gait Distance (Feet): 5 Feet Assistive device: Rolling walker (2 wheels) Gait Pattern/deviations: Decreased weight shift to left          Stairs            Wheelchair Mobility    Modified Rankin (Stroke Patients Only)       Balance Overall balance assessment: Needs assistance Sitting-balance support: Feet supported Sitting balance-Leahy Scale: Normal     Standing balance support: During functional activity Standing balance-Leahy Scale: Fair                               Pertinent Vitals/Pain Pain Assessment Pain Assessment: No/denies pain Pain Score: 0-No pain Pain Location: Pt reporting no pain at rest    Home Living Family/patient expects to be discharged to:: Private residence Living Arrangements: Other relatives Available Help at Discharge: Friend(s);Available PRN/intermittently Type of Home: Apartment Home Access: Stairs to enter Entrance Stairs-Rails:  (One middle handrail) Entrance Stairs-Number of Steps: 5   Home Layout: One level Home Equipment: Rollator (4 wheels);Gilmer Mor - single point (Belongs to brother) Additional Comments: pt cares for his brother; 5/19 pt reports that they do not have a shower seat  any longer.    Prior Function Prior Level of Function : Needs assist  Cognitive Assist : ADLs (cognitive) (Pt reporting that he has Short Term Memory loss 2/2 CVA)             ADLs Comments: Independent with ADL's     Hand Dominance        Extremity/Trunk Assessment   Upper Extremity Assessment Upper Extremity Assessment: Overall WFL for tasks  assessed    Lower Extremity Assessment Lower Extremity Assessment: Overall WFL for tasks assessed    Cervical / Trunk Assessment Cervical / Trunk Assessment: Normal  Communication   Communication: No difficulties  Cognition Arousal/Alertness: Awake/alert Behavior During Therapy: WFL for tasks assessed/performed Overall Cognitive Status: Within Functional Limits for tasks assessed                                          General Comments      Exercises     Assessment/Plan    PT Assessment Patient needs continued PT services  PT Problem List Decreased strength;Decreased balance       PT Treatment Interventions Gait training;Therapeutic exercise;Balance training    PT Goals (Current goals can be found in the Care Plan section)  Acute Rehab PT Goals Patient Stated Goal: Return home to be caregiver for brother PT Goal Formulation: With patient Time For Goal Achievement: 05/03/23 Potential to Achieve Goals: Good    Frequency Min 3X/week     Co-evaluation               AM-PAC PT "6 Clicks" Mobility  Outcome Measure Help needed turning from your back to your side while in a flat bed without using bedrails?: None Help needed moving from lying on your back to sitting on the side of a flat bed without using bedrails?: None Help needed moving to and from a bed to a chair (including a wheelchair)?: A Little Help needed standing up from a chair using your arms (e.g., wheelchair or bedside chair)?: A Little Help needed to walk in hospital room?: A Little Help needed climbing 3-5 steps with a railing? : A Lot 6 Click Score: 19    End of Session   Activity Tolerance: Patient tolerated treatment well;No increased pain Patient left: in chair Nurse Communication: Mobility status PT Visit Diagnosis: Difficulty in walking, not elsewhere classified (R26.2)    Time: 1610-9604 PT Time Calculation (min) (ACUTE ONLY): 18 min   Charges:   PT  Evaluation $PT Eval Low Complexity: 1 Low PT Treatments $Gait Training: 8-22 mins        12:45 PM, 04/26/23 Ameriah Lint A. Mordecai Maes PT, DPT Physical Therapist - [facility]   Ady Heimann A Khushi Zupko 04/26/2023, 12:45 PM

## 2023-04-26 NOTE — Plan of Care (Signed)
  Problem: Clinical Measurements: Goal: Ability to maintain clinical measurements within normal limits will improve Outcome: Progressing   Problem: Activity: Goal: Risk for activity intolerance will decrease Outcome: Progressing   Problem: Coping: Goal: Level of anxiety will decrease Outcome: Progressing   Problem: Pain Managment: Goal: General experience of comfort will improve Outcome: Progressing   Problem: Safety: Goal: Ability to remain free from injury will improve Outcome: Progressing   

## 2023-04-27 LAB — BASIC METABOLIC PANEL
Anion gap: 8 (ref 5–15)
BUN: 15 mg/dL (ref 6–20)
CO2: 23 mmol/L (ref 22–32)
Calcium: 9 mg/dL (ref 8.9–10.3)
Chloride: 102 mmol/L (ref 98–111)
Creatinine, Ser: 1.24 mg/dL (ref 0.61–1.24)
GFR, Estimated: >60 mL/min (ref >60)
Glucose, Bld: 148 mg/dL — ABNORMAL HIGH (ref 70–99)
Potassium: 4.2 mmol/L (ref 3.5–5.1)
Sodium: 133 mmol/L — ABNORMAL LOW (ref 135–145)

## 2023-04-27 MED ORDER — HYDROCHLOROTHIAZIDE 12.5 MG PO TABS
12.5000 mg | ORAL_TABLET | Freq: Every day | ORAL | Status: DC
  Administered 2023-04-27: 12.5 mg via ORAL
  Filled 2023-04-27: qty 1

## 2023-04-27 MED ORDER — OLMESARTAN MEDOXOMIL-HCTZ 40-12.5 MG PO TABS: 1.0000 | ORAL_TABLET | Freq: Every day | ORAL | Status: DC

## 2023-04-27 MED ORDER — CARVEDILOL 12.5 MG PO TABS
12.5000 mg | ORAL_TABLET | Freq: Two times a day (BID) | ORAL | Status: DC
  Administered 2023-04-27: 12.5 mg via ORAL
  Filled 2023-04-27: qty 1

## 2023-04-27 MED ORDER — PREDNISONE 10 MG PO TABS: ORAL_TABLET | ORAL | 0 refills | Status: AC

## 2023-04-27 MED ORDER — IRBESARTAN 150 MG PO TABS
300.0000 mg | ORAL_TABLET | Freq: Every day | ORAL | Status: DC
  Administered 2023-04-27: 300 mg via ORAL
  Filled 2023-04-27: qty 2

## 2023-04-27 NOTE — Progress Notes (Signed)
Occupational Therapy Treatment Patient Details Name: James Lambert MRN: [id number] DOB: 15-Feb-1966 Today's Date: 04/27/2023   History of present illness James Lambert is a 57 y.o. male with medical history significant of tobacco abuse, alcohol abuse, stroke with residual right-sided weakness, hypertension, hyperlipidemia, COPD, asthma, obesity, chronic bilateral knee pain, OSA not on CPAP, who presents with fall. R knee MRI 04/24/23: Extensive complex tearing/maceration of the lateral meniscus.   OT comments  James Lambert was seen for OT treatment on this date. Upon arrival to room pt reclined in bed, agreeable to tx. Pt requires SBA + RW toilet t/f, SUPERVISION toileting in standing and hand hygiene. Reports dizziness with mobility, BP remains elevated 151/106 in sitting, RN aware. Pt making good progress toward goals, will continue to follow POC. Discharge recommendation remains appropriate.     Recommendations for follow up therapy are one component of a multi-disciplinary discharge planning process, led by the attending physician.  Recommendations may be updated based on patient status, additional functional criteria and insurance authorization.    Assistance Recommended at Discharge Intermittent Supervision/Assistance  Patient can return home with the following  A little help with walking and/or transfers;A little help with bathing/dressing/bathroom;Help with stairs or ramp for entrance   Equipment Recommendations  BSC/3in1;Wheelchair (measurements OT)    Recommendations for Other Services      Precautions / Restrictions Precautions Precautions: Fall Restrictions Weight Bearing Restrictions: No       Mobility Bed Mobility Overal bed mobility: Independent                  Transfers Overall transfer level: Needs assistance Equipment used: Rolling walker (2 wheels) Transfers: Sit to/from Stand Sit to Stand: From elevated surface, Supervision                  Balance Overall balance assessment: Needs assistance Sitting-balance support: Feet supported Sitting balance-Leahy Scale: Normal     Standing balance support: No upper extremity supported, During functional activity Standing balance-Leahy Scale: Fair                             ADL either performed or assessed with clinical judgement   ADL Overall ADL's : Needs assistance/impaired                                       General ADL Comments: SBA + RW toilet t/f, SUPERVISION toileting in standing and hand hygiene.      Cognition Arousal/Alertness: Awake/alert Behavior During Therapy: WFL for tasks assessed/performed Overall Cognitive Status: Within Functional Limits for tasks assessed                                                     Pertinent Vitals/ Pain       Pain Assessment Pain Assessment: 0-10 Pain Score: 5  Pain Location: R knee Pain Descriptors / Indicators: Aching, Grimacing, Discomfort Pain Intervention(s): Limited activity within patient's tolerance, Repositioned, Premedicated before session   Frequency  Min 1X/week        Progress Toward Goals  OT Goals(current goals can now be found in the care plan section)  Progress towards OT goals: Progressing toward goals  Acute Rehab OT Goals  Patient Stated Goal: to go home OT Goal Formulation: With patient Time For Goal Achievement: 05/09/23 Potential to Achieve Goals: Good ADL Goals Pt Will Perform Grooming: with modified independence;standing Pt Will Perform Lower Body Dressing: with modified independence;sit to/from stand Pt Will Transfer to Toilet: with modified independence;ambulating;regular height toilet  Plan Discharge plan needs to be updated;Frequency remains appropriate    Co-evaluation                 AM-PAC OT "6 Clicks" Daily Activity     Outcome Measure   Help from another person eating meals?: None Help from another person  taking care of personal grooming?: A Little Help from another person toileting, which includes using toliet, bedpan, or urinal?: A Little Help from another person bathing (including washing, rinsing, drying)?: A Lot Help from another person to put on and taking off regular upper body clothing?: None Help from another person to put on and taking off regular lower body clothing?: A Lot 6 Click Score: 18    End of Session Equipment Utilized During Treatment: Rolling walker (2 wheels)  OT Visit Diagnosis: Other abnormalities of gait and mobility (R26.89);Muscle weakness (generalized) (M62.81)   Activity Tolerance Patient tolerated treatment well   Patient Left in bed;with call bell/phone within reach   Nurse Communication          Time: 1610-9604 OT Time Calculation (min): 16 min  Charges: OT Treatments $Self Care/Home Management : 8-22 mins  Kathie Dike, M.S. OTR/L  04/27/23, 10:00 AM  ascom [phone]

## 2023-04-27 NOTE — Progress Notes (Signed)
PT Cancellation Note  Patient Details Name: DEMORION ONDO MRN: [id number] DOB: 12-Dec-1965   Cancelled Treatment:    Reason Eval/Treat Not Completed: Medical issues which prohibited therapy (Pt has had elevated pressures at AM and lunch vitals check, despite attempts to medically manage. Will defer PT session to next date until medically more appropriate.)  -180/167mm Hg in AM -per OT note, dizzy with mobility ~10:00 and pressure at 151/147mm Hg -170/162mm Hg around noon   Shamia Uppal C 04/27/2023, 1:00 PM  1:01 PM, 04/27/23 Rosamaria Lints, PT, DPT Physical Therapist - [facility]  [phone] [facility])

## 2023-04-27 NOTE — TOC Progression Note (Signed)
Transition of Care [facility]) - Progression Note    Patient Details  Name: James Lambert MRN: [id number] Date of Birth: 11/23/1966  [facility]) CM/SW Contact  Marlowe Sax, RN Phone Number: 04/27/2023, 2:56 PM  Clinical Narrative:   Spoke with the patient He lives at home with his brother, he stated that his friend [name] will provide transportation He has a BSC, He stated that he need a Rolling walker, adapt to deliver to the bedside He would like to have [facility] PT, I checked with multiple HH agencies and [facility] is the only one INN at this time, they are checking to see if they can accept the patient He has a PCP in place He stated that he has no additional needs     Expected Discharge Plan: Home w Home Health Services    Expected Discharge Plan and Services   Discharge Planning Services: CM Consult   Living arrangements for the past 2 months: Apartment Expected Discharge Date: 04/27/23               DME Arranged: Dan Humphreys rolling DME Agency: [employer] Date DME Agency Contacted: 04/27/23 Time DME Agency Contacted: [phone] Representative spoke with at DME Agency: intake             Social Determinants of Health (SDOH) Interventions SDOH Screenings   Food Insecurity: No Food Insecurity (04/24/2023)  Housing: Low Risk  (04/24/2023)  Transportation Needs: No Transportation Needs (04/24/2023)  Utilities: Not At Risk (04/24/2023)  Alcohol Screen: Low Risk  (09/10/2022)  Depression (PHQ2-9): Medium Risk (04/15/2023)  Financial Resource Strain: High Risk (03/09/2023)  Physical Activity: Insufficiently Active (09/10/2022)  Social Connections: Socially Isolated (09/10/2022)  Stress: No Stress Concern Present (02/25/2023)  Tobacco Use: High Risk (04/24/2023)    Readmission Risk Interventions     No data to display

## 2023-04-27 NOTE — TOC Progression Note (Signed)
Transition of Care [facility]) - Progression Note    Patient Details  Name: James Lambert MRN: [id number] Date of Birth: Apr 25, 1966  Transition of Care [facility]) CM/SW Contact  Marlowe Sax, RN Phone Number: 04/27/2023, 3:21 PM  Clinical Narrative:   [facility] accepted the patient for [facility] and will SOC 24-48 hours after he goes home Per [place]    Expected Discharge Plan: Home w Home Health Services    Expected Discharge Plan and Services   Discharge Planning Services: CM Consult   Living arrangements for the past 2 months: Apartment Expected Discharge Date: 04/27/23               DME Arranged: Dan Humphreys rolling DME Agency: [employer] Date DME Agency Contacted: 04/27/23 Time DME Agency Contacted: [phone] Representative spoke with at DME Agency: intake             Social Determinants of Health (SDOH) Interventions SDOH Screenings   Food Insecurity: No Food Insecurity (04/24/2023)  Housing: Low Risk  (04/24/2023)  Transportation Needs: No Transportation Needs (04/24/2023)  Utilities: Not At Risk (04/24/2023)  Alcohol Screen: Low Risk  (09/10/2022)  Depression (PHQ2-9): Medium Risk (04/15/2023)  Financial Resource Strain: High Risk (03/09/2023)  Physical Activity: Insufficiently Active (09/10/2022)  Social Connections: Socially Isolated (09/10/2022)  Stress: No Stress Concern Present (02/25/2023)  Tobacco Use: High Risk (04/24/2023)    Readmission Risk Interventions     No data to display

## 2023-04-27 NOTE — Progress Notes (Signed)
  Subjective:  Patient complains of continued bilateral knee pain.  Patient states he felt dizzy when he got up today with Occupational Therapy.  Physical therapy was deferred due to the dizziness.  Objective:   VITALS:   Vitals:   04/27/23 0249 04/27/23 0829 04/27/23 1145 04/27/23 1300  BP: (!) 164/101 (!) 180/117 (!) 170/115 (!) 159/103  Pulse: 82 77 72   Resp:  20    Temp:  99 F (37.2 C)    TempSrc:  Oral    SpO2:  100%    Weight:      Height:        PHYSICAL EXAM: Bilateral lower extremity Neurovascular intact Sensation intact distally Intact pulses distally Dorsiflexion/Plantar flexion intact No cellulitis present Compartment soft  LABS  Results for orders placed or performed during the hospital encounter of 04/24/23 (from the past 24 hour(s))  Basic metabolic panel     Status: Abnormal   Collection Time: 04/27/23  5:01 AM  Result Value Ref Range   Sodium 133 (L) 135 - 145 mmol/L   Potassium 4.2 3.5 - 5.1 mmol/L   Chloride 102 98 - 111 mmol/L   CO2 23 22 - 32 mmol/L   Glucose, Bld 148 (H) 70 - 99 mg/dL   BUN 15 6 - 20 mg/dL   Creatinine, Ser 4.09 0.61 - 1.24 mg/dL   Calcium 9.0 8.9 - 81.1 mg/dL   GFR, Estimated >91 >47 mL/min   Anion gap 8 5 - 15    No results found.  Assessment/Plan:     Principal Problem:   Alcohol withdrawal (HCC) Active Problems:   History of CVA (cerebrovascular accident)   Hypertension   COPD (chronic obstructive pulmonary disease) (HCC)   AKI (acute kidney injury) (HCC)   Fall at home, initial encounter   Hyponatremia   Bilateral knee pain   HLD (hyperlipidemia)   Obesity (BMI 30-39.9)   Tobacco abuse   Complex tear of meniscus of right knee  Continued bilateral anterior knee pain secondary to contusion versus flareup of underlying osteoarthritis.  Continue prednisone for inflammation.  Continue PT/OT as tolerated.  Patient may be discharged from an orthopedic standpoint once cleared medically and is deemed safe by PT.       James Lambert , MD 04/27/2023, 1:30 PM

## 2023-04-27 NOTE — Discharge Summary (Signed)
Physician Discharge Summary   Patient: James Lambert MRN: [id number] DOB: 1966/11/03  Admit date:     04/24/2023  Discharge date: 04/27/2023  Discharge Physician: Pennie Banter   PCP: Marjie Skiff, NP   Recommendations at discharge:  {Tip this will not be part of the note when signed- Example include specific recommendations for outpatient follow-up, pending tests to follow-up on. (Optional):26781}  ***  Discharge Diagnoses: Principal Problem:   Alcohol withdrawal (HCC) Active Problems:   History of CVA (cerebrovascular accident)   Hypertension   COPD (chronic obstructive pulmonary disease) (HCC)   Fall at home, initial encounter   Hyponatremia   AKI (acute kidney injury) (HCC)   Bilateral knee pain   HLD (hyperlipidemia)   Tobacco abuse   Obesity (BMI 30-39.9)   Complex tear of meniscus of right knee  Resolved Problems:   * No resolved hospital problems. [facility] Course: No notes on file  Assessment and Plan: * Alcohol withdrawal (HCC) On admission, pt noted to have diaphoresis improved after given Ativan in ED. Being monitored on CIWA with low scores. 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 --Continue Plavix, Lipitor  COPD (chronic obstructive pulmonary disease) (HCC) Stable, not exacerbated. --Continue bronchodilators   Hypertension --Continue amlodipine --PRN hydralazine --Hold Benicar-HCTZ with AKI.  Fall at home, initial encounter Possibly occurred related to alcohol intoxication. Fall precautions PT/OT evaluations  Hyponatremia Na 129 on admission.  Started on IV fluids, felt hypovolemic etiology.  Beer potomania also in the differential Na improved to 134. --Continue IV fluids for AKI --Daily BMP to  monitor --further evaluation if not improving as expected  AKI (acute kidney injury) (HCC) Likely due to dehydration and continuation of Benicar-HCTZ Cr improving on IV fluids, remains above baseline --Continue IV fluids --Hold Benicar-HCTZ --Daily BMP to monitor  Bilateral knee pain Chronic, bilateral tricompartmental Osteoarthritis.  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 NSAId's contraindicated with AKI currently --Start Prednisone 40 mg daily & discharge on taper --No indication for surgical intervention --PT recommends HH PT --Pain control PRN --Outpatient ortho follow up to consider injections  HLD (hyperlipidemia) --Lipitor  Tobacco abuse Nicotine patch ordered Counseled on importance of cessations.  Obesity (BMI 30-39.9) Body mass index is 31.12 kg/m. Complicates overall care and prognosis.  Recommend lifestyle modifications including physical activity and diet for weight loss and overall long-term health.   Complex tear of meniscus of right knee See Bilateral Knee Pain      {Tip this will not be part of the note when signed Body mass index is 31.12 kg/m. , ,  (Optional):26781}  {(NOTE) Pain control PDMP Statment (Optional):26782} Consultants: *** Procedures performed: ***  Disposition: {Plan; Disposition:26390} Diet recommendation:  {Diet_Plan:26776} DISCHARGE MEDICATION: Allergies as of 04/27/2023   No Known Allergies   Med Rec must be completed prior to using this [facility]***       Discharge Exam: Filed Weights   04/24/23 1354  Weight: 110 kg   ***  Condition at discharge: {DC Condition:26389}  The results of significant diagnostics from this hospitalization (including imaging, microbiology, ancillary and laboratory) are listed below for reference.   Imaging  Studies:  MR KNEE RIGHT W WO CONTRAST  Result Date: 04/25/2023 CLINICAL DATA:  Knee pain.  Abnormal x-ray.  Possible bone lesion. EXAM: MRI OF THE RIGHT KNEE WITHOUT AND WITH CONTRAST TECHNIQUE: Multiplanar, multisequence MR imaging of the right was performed both before and after administration of intravenous contrast. CONTRAST:  10mL GADAVIST GADOBUTROL 1 MMOL/ML IV SOLN COMPARISON:  X-ray 04/24/2023, 12/21/2020 FINDINGS: MENISCI Medial meniscus: Intrasubstance degeneration with complex tearing of the medial meniscal body and posterior horn. Lateral meniscus: Extensive complex tearing/maceration of the lateral meniscus. There is extrusion of the lateral meniscal body. LIGAMENTS Cruciates: Intact ACL and PCL.  Mucoid degeneration of the ACL. Collaterals: Intact MCL. Lateral collateral ligament complex intact. CARTILAGE Patellofemoral: High-grade cartilage loss of the lateral patellar facet and lateral trochlea. Medial: Mild chondral thinning and surface irregularity of the weight-bearing medial compartment. Lateral: Extensive full-thickness cartilage loss of the articular cartilage of the lateral compartment with some osseous remodeling. Extensive subchondral cyst formation along both sides of the joint. MISCELLANEOUS Joint: Small joint effusion. Several small scattered intra-articular loose bodies. 11 x 9 mm parameniscal cyst or ganglion along the anterior margin of the lateral tibial plateau. Fat pads within normal limits. Popliteal Fossa:  No Baker's cyst. Intact popliteus tendon. Extensor Mechanism: Intact quadriceps and patellar tendons. Mild patellar tendinosis. Bones: Tricompartmental joint space narrowing with bulky marginal osteophyte formation, most pronounced within the lateral compartment. Extensive subchondral marrow edema within the lateral compartment, likely reactive. 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 No associated  marrow edema or periostitis. No marrow replacing bone lesion. Other: Multiple superficial varicosities are present. No significant periarticular soft tissue findings. IMPRESSION: 1. 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 No suspicious bone lesion. 2. 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 3. Extensive complex tearing/maceration of the lateral meniscus. 4. Degenerative tearing of the medial meniscus. Electronically Signed   By: Duanne Guess D.O.   On: 04/25/2023 09:30   CT HEAD WO CONTRAST ( )  Result Date: 04/24/2023 CLINICAL DATA:  Trauma EXAM: CT HEAD WITHOUT CONTRAST TECHNIQUE: Contiguous axial images were obtained from the base of the skull through the vertex without intravenous contrast. RADIATION DOSE REDUCTION: This exam was performed according to the departmental dose-optimization program which includes automated exposure control, adjustment of the mA and/or kV according to patient size and/or use of iterative reconstruction technique. COMPARISON:  CT head 02/04/2023 FINDINGS: Brain: No evidence of acute infarction, hemorrhage, hydrocephalus, extra-axial collection or mass lesion/mass effect. Chronic left parietal lobe and left frontal lobe infarct. There is also a chronic left cerebellar infarct. Sequela of mild overall microvascular ischemic change. Vascular: No hyperdense vessel or unexpected calcification. Skull: Normal. Negative for fracture or focal lesion. Sinuses/Orbits: No middle ear or mastoid effusion. Paranasal sinuses are notable for mucosal thickening in the right maxillary sinus. Orbits are unremarkable. Other: None. IMPRESSION: No CT evidence of intracranial injury. Electronically Signed   By: Lorenza Cambridge M.D.   On: 04/24/2023 14:53   DG Chest Portable 1 View  Result Date:  04/24/2023 CLINICAL DATA:  Status post fall EXAM: PORTABLE CHEST 1 VIEW COMPARISON:  Chest radiograph dated 03/18/2023 FINDINGS: Normal lung volumes. No focal consolidations. No pleural effusion or pneumothorax. The heart size and mediastinal contours are within normal limits. Unchanged heterotopic ossification along the inferior distal right clavicle. Old left rib No radiographic finding  of acute displaced fracture. deformities. IMPRESSION: No radiographic finding of acute displaced fracture. Electronically Signed   By: Agustin Cree M.D.   On: 04/24/2023 14:47   DG Knee Complete 4 Views Left  Result Date: 04/24/2023 CLINICAL DATA:  Knee pain EXAM: LEFT KNEE - COMPLETE 4+ VIEW; RIGHT KNEE - COMPLETE 4+ VIEW COMPARISON:  None available FINDINGS: Right knee: Moderate joint space loss and spurring of the medial and lateral compartments. Mild spurring and moderate joint space loss in the patellofemoral compartment. Osseous exostosis extending medially from the fibular neck measures 2.0 x 1.7 cm. No fracture or dislocation. Atherosclerotic changes seen throughout visualized arterial segments. Left knee: Moderate to severe joint space loss and spurring of the lateral compartment. Mild spurring of the patellofemoral and medial compartments. Small knee joint effusion. Atherosclerotic changes seen throughout visualized arterial segments. IMPRESSION: 1. No acute fracture or dislocation of the knees. 2. Small left knee joint effusion. 3. Bilateral tricompartmental osteoarthrosis as discussed above. 4. 2.0 x 1.7 cm exostosis extending medially from the right fibular neck is suspicious for osteochondroma and given patient's history of pain, should be further evaluated with contrast-enhanced MRI. Electronically Signed   By: Acquanetta Belling M.D.   On: 04/24/2023 14:36   DG Knee Complete 4 Views Right  Result Date: 04/24/2023 CLINICAL DATA:  Knee pain EXAM: LEFT KNEE - COMPLETE 4+ VIEW; RIGHT KNEE - COMPLETE 4+ VIEW COMPARISON:   None available FINDINGS: Right knee: Moderate joint space loss and spurring of the medial and lateral compartments. Mild spurring and moderate joint space loss in the patellofemoral compartment. Osseous exostosis extending medially from the fibular neck measures 2.0 x 1.7 cm. No fracture or dislocation. Atherosclerotic changes seen throughout visualized arterial segments. Left knee: Moderate to severe joint space loss and spurring of the lateral compartment. Mild spurring of the patellofemoral and medial compartments. Small knee joint effusion. Atherosclerotic changes seen throughout visualized arterial segments. IMPRESSION: 1. No acute fracture or dislocation of the knees. 2. Small left knee joint effusion. 3. Bilateral tricompartmental osteoarthrosis as discussed above. 4. 2.0 x 1.7 cm exostosis extending medially from the right fibular neck is suspicious for osteochondroma and given patient's history of pain, should be further evaluated with contrast-enhanced MRI. Electronically Signed   By: Acquanetta Belling M.D.   On: 04/24/2023 14:36    Microbiology: Results for orders placed or performed during the hospital encounter of 03/18/23  Resp panel by RT-PCR (RSV, Flu A&B, Covid) Anterior Nasal Swab     Status: None   Collection Time: 03/18/23  3:52 AM   Specimen: Anterior Nasal Swab  Result Value Ref Range Status   SARS Coronavirus 2 by RT PCR NEGATIVE NEGATIVE Final    Comment: (NOTE) SARS-CoV-2 target nucleic acids are NOT DETECTED.  The SARS-CoV-2 RNA is generally detectable in upper respiratory specimens during the acute phase of infection. The lowest concentration of SARS-CoV-2 viral copies this assay can detect is 138 copies/mL. A negative result does not preclude SARS-Cov-2 infection and should not be used as the sole basis for treatment or other patient management decisions. A negative result may occur with  improper specimen collection/handling, submission of specimen other than nasopharyngeal  swab, presence of viral mutation(s) within the areas targeted by this assay, and inadequate number of viral copies(<138 copies/mL). A negative result must be combined with clinical observations, patient history, and epidemiological information. The expected result is Negative.  Fact Sheet for Patients:  BloggerCourse.com  Fact Sheet for Healthcare Providers:  SeriousBroker.it  This  test is no t yet approved or cleared by the [place] and  has been authorized for detection and/or diagnosis of SARS-CoV-2 by FDA under an Emergency Use Authorization (EUA). This EUA will remain  in effect (meaning this test can be used) for the duration of the COVID-19 declaration under Section 564(b)(1) of the Act, 21 U.S.C.section 360bbb-3(b)(1), unless the authorization is terminated  or revoked sooner.       Influenza A by PCR NEGATIVE NEGATIVE Final   Influenza B by PCR NEGATIVE NEGATIVE Final    Comment: (NOTE) The Xpert Xpress SARS-CoV-2/FLU/RSV plus assay is intended as an aid in the diagnosis of influenza from Nasopharyngeal swab specimens and should not be used as a sole basis for treatment. Nasal washings and aspirates are unacceptable for Xpert Xpress SARS-CoV-2/FLU/RSV testing.  Fact Sheet for Patients: BloggerCourse.com  Fact Sheet for Healthcare Providers: SeriousBroker.it  This test is not yet approved or cleared by the [place] FDA and has been authorized for detection and/or diagnosis of SARS-CoV-2 by FDA under an Emergency Use Authorization (EUA). This EUA will remain in effect (meaning this test can be used) for the duration of the COVID-19 declaration under Section 564(b)(1) of the Act, 21 U.S.C. section 360bbb-3(b)(1), unless the authorization is terminated or revoked.     Resp Syncytial Virus by PCR NEGATIVE NEGATIVE Final    Comment: (NOTE) Fact Sheet for  Patients: BloggerCourse.com  Fact Sheet for Healthcare Providers: SeriousBroker.it  This test is not yet approved or cleared by the [place] FDA and has been authorized for detection and/or diagnosis of SARS-CoV-2 by FDA under an Emergency Use Authorization (EUA). This EUA will remain in effect (meaning this test can be used) for the duration of the COVID-19 declaration under Section 564(b)(1) of the Act, 21 U.S.C. section 360bbb-3(b)(1), unless the authorization is terminated or revoked.  Performed at [facility], [address] [place], [place] [address]     Labs: CBC: Recent Labs  Lab 04/24/23 1355 04/25/23 0541  WBC 6.7 6.4  HGB 13.5 11.7*  HCT 40.7 35.9*  MCV 86.0 87.1  PLT 428* 342   Basic Metabolic Panel: Recent Labs  Lab 04/24/23 1355 04/25/23 0541 04/26/23 0512 04/27/23 0501  NA 129* 129* 134* 133*  K 3.8 3.3* 3.9 4.2  CL 93* 96* 103 102  CO2 22 25 24 23   GLUCOSE 105* 110* 112* 148*  BUN 22* 32* 25* 15  CREATININE 1.63* 1.79* 1.43* 1.24  CALCIUM 9.4 8.7* 8.7* 9.0  MG 1.8  --  1.7  --   PHOS 4.0  --  3.4  --    Liver Function Tests: Recent Labs  Lab 04/24/23 1355  AST 19  ALT 13  ALKPHOS 110  BILITOT 0.6  PROT 9.3*  ALBUMIN 4.1   CBG: Recent Labs  Lab 04/24/23 1353  GLUCAP 91    Discharge time spent: {LESS THAN/GREATER THAN:26388} 30 minutes.  Signed: Pennie Banter, DO [facility] 04/27/2023

## 2023-04-28 ENCOUNTER — Ambulatory Visit: Payer: Self-pay | Admitting: *Deleted

## 2023-04-28 ENCOUNTER — Telehealth: Payer: Self-pay

## 2023-04-28 NOTE — Telephone Encounter (Signed)
  Chief Complaint: FYI for Aura Dials, NP.     Pt called in wanting to know what medications he needed to continue taking and which one to stop.   He was in the hospital over the weekend because his knee gave out (Per chart Alcohol withdrawal).   He has lost his discharge papers and is sure the hospital dr. Kem Parkinson one of his medications.   Only thing mentioned was to not take Aleve or ibuprofen due to his kidney injury.   I made sure he understood that. I went over his AVS medication list.   He has all the medications and is taking the prednisone.    Nothing mentioned to stop taking except no to take Aleve or ibuprofen when I went through his whole list of drugs. Symptoms: N/A Frequency: N/A Pertinent Negatives: Patient denies N/A Disposition: [] ED /[] Urgent Care (no appt availability in office) / [] Appointment(In office/virtual)/ []  [place] [facility]/ [] Home Care/ [] Refused Recommended Disposition /[] [place] [facility]/ [x]  Follow-up with PCP Additional Notes: Informational for Aura Dials, NP  I went over his AVS medications with him.     No prescription medications were discontinued that were mentioned in his AVS from the hospital (5/17-5/20/2024).

## 2023-04-28 NOTE — Transitions of Care (Post Inpatient/ED Visit) (Unsigned)
   04/28/2023  Name: James Lambert MRN: [id number] DOB: 11/28/1966  Today's TOC FU Call Status: Today's TOC FU Call Status:: Unsuccessul Call (1st Attempt) Unsuccessful Call (1st Attempt) Date: 04/28/23  Attempted to reach the patient regarding the most recent Inpatient/ED visit.  Follow Up Plan: Additional outreach attempts will be made to reach the patient to complete the Transitions of Care (Post Inpatient/ED visit) call.   Signature Karena Addison, LPN [facility] Nurse Health Advisor Direct Dial [phone]

## 2023-04-28 NOTE — Telephone Encounter (Signed)
Message from Randol Kern sent at 04/28/2023  2:30 PM EDT  Summary: Medication questions   Has medication questions, needs to know what he needs to continue taking and what he needs to stop          Call History   Type Contact Phone/Fax User  04/28/2023 02:29 PM EDT Phone (Incoming) Jadyne, [place] (Self) [phone] Judie Petit) Pilar Plate    Reason for Disposition  Caller has medicine question only, adult not sick, AND triager answers question  Answer Assessment - Initial Assessment Questions 1. NAME of MEDICINE: "What medicine(s) are you calling about?"     I need to know what medications I to take and to stop. My knee gave out over the weekend so I went to the hospital.   My BP was high the whole time.   I can't find my discharge papers from the hospital.    2. QUESTION: "What is your question?" (e.g., double dose of medicine, side effect)     I don't know what medicines to stop and which ones to take.   It seems like the dr told me to stop one of my medicines but I can't find the paper work they gave me. 3. PRESCRIBER: "Who prescribed the medicine?" Reason: if prescribed by specialist, call should be referred to that group.     Dr. At the hospital 4. SYMPTOMS: "Do you have any symptoms?" If Yes, ask: "What symptoms are you having?"  "How bad are the symptoms (e.g., mild, moderate, severe)     N/A 5. PREGNANCY:  "Is there any chance that you are pregnant?" "When was your last menstrual period?"     N/A  Protocols used: Medication Question Call-A-AH

## 2023-04-28 NOTE — Telephone Encounter (Signed)
Note, thank you for going through medications with him

## 2023-04-29 ENCOUNTER — Ambulatory Visit: Payer: Medicaid Other | Admitting: Surgery

## 2023-04-29 NOTE — Transitions of Care (Post Inpatient/ED Visit) (Signed)
   04/29/2023  Name: James Lambert MRN: [id number] DOB: 04/12/1966  Today's TOC FU Call Status: Today's TOC FU Call Status:: Unsuccessful Call (2nd Attempt) Unsuccessful Call (1st Attempt) Date: 04/28/23 Unsuccessful Call (2nd Attempt) Date: 04/29/23  Attempted to reach the patient regarding the most recent Inpatient/ED visit.  Follow Up Plan: Additional outreach attempts will be made to reach the patient to complete the Transitions of Care (Post Inpatient/ED visit) call.   Signature Karena Addison, LPN [facility] Nurse Health Advisor Direct Dial [phone]

## 2023-04-30 ENCOUNTER — Telehealth: Payer: Self-pay | Admitting: Nurse Practitioner

## 2023-04-30 DIAGNOSIS — G8929 Other chronic pain: Secondary | ICD-10-CM

## 2023-04-30 NOTE — Telephone Encounter (Signed)
Verbal orders given per Jolene 

## 2023-04-30 NOTE — Telephone Encounter (Signed)
Copied from CRM [id number]. Topic: General - Other >> Apr 30, 2023 10:33 AM Macon Large wrote: Reason for CRM: James Lambert with [facility] reports that pt informed her that a walker was ordered but the pt has not received it. Please contact pt to advise.

## 2023-04-30 NOTE — Telephone Encounter (Signed)
I do not see an order for a walker for patient, did we order one?

## 2023-04-30 NOTE — Transitions of Care (Post Inpatient/ED Visit) (Signed)
   04/30/2023  Name: James Lambert MRN: [id number] DOB: 17-Jan-1966  Today's TOC FU Call Status: Today's TOC FU Call Status:: Unsuccessful Call (3rd Attempt) Unsuccessful Call (1st Attempt) Date: 04/28/23 Unsuccessful Call (2nd Attempt) Date: 04/29/23 Unsuccessful Call (3rd Attempt) Date: 04/30/23  Attempted to reach the patient regarding the most recent Inpatient/ED visit.  Follow Up Plan: No further outreach attempts will be made at this time. We have been unable to contact the patient.  Signature Karena Addison, LPN [facility] Nurse Health Advisor Direct Dial [phone]

## 2023-04-30 NOTE — Telephone Encounter (Signed)
Copied from CRM [id number]. Topic: Quick Communication - Home Health Verbal Orders >> Apr 30, 2023 10:31 AM Macon Large wrote: Caller/Agency: Tresa Endo with [facility]  Callback Number: [phone] Requesting OT/PT/Skilled Nursing/Social Work/Speech Therapy: PT  Frequency: 2 x 3 and 1 x 5

## 2023-04-30 NOTE — Telephone Encounter (Signed)
Paperwork faxed to [employer] at [phone]

## 2023-05-08 ENCOUNTER — Other Ambulatory Visit: Payer: Self-pay | Admitting: Radiology

## 2023-05-08 DIAGNOSIS — I639 Cerebral infarction, unspecified: Secondary | ICD-10-CM

## 2023-05-13 ENCOUNTER — Other Ambulatory Visit: Payer: Medicaid Other

## 2023-05-13 NOTE — Progress Notes (Signed)
   05/13/2023  Patient ID: James Lambert, male   DOB: 1966-03-18, 57 y.o.   MRN: [id number]  Outreach attempt to check on home medications and assess need for refills.  Patient recently hospitalized and no medication changes were made upon discharge; but based on last fill dates and quantities, patient will be due for refills in the next 7-10 days.  Unable to reach but left voicemail with my direct number and will try to reach again next week.  Lenna Gilford, PharmD, DPLA

## 2023-05-20 ENCOUNTER — Other Ambulatory Visit: Payer: Medicaid Other

## 2023-05-20 NOTE — Progress Notes (Signed)
   05/20/2023  Patient ID: James Lambert, male   DOB: 07/23/1966, 58 y.o.   MRN: [id number]  Patient outreach to evaluate and assist with medication adherence, as patient is likely due for refills on his maintenance medications  -[name] does endorse that he is running low on his maintenance medications and needs them refilled -He would prefer to pick these up at [facility] on [address] versus fill at [facility] -Endorses ability to get to [facility] and states he will pick medications up tomorrow -Emphasized picking up refills and taking as prescribed and reminded that he has an appointment with Jolene next week -Contacted [facility] and asked that they transfer his medications from [facility]  Lenna Gilford, PharmD, DPLA

## 2023-05-25 ENCOUNTER — Ambulatory Visit: Payer: Medicaid Other | Admitting: Nurse Practitioner

## 2023-05-25 DIAGNOSIS — Z8673 Personal history of transient ischemic attack (TIA), and cerebral infarction without residual deficits: Secondary | ICD-10-CM

## 2023-05-25 DIAGNOSIS — G8929 Other chronic pain: Secondary | ICD-10-CM

## 2023-05-25 DIAGNOSIS — I700 Atherosclerosis of aorta: Secondary | ICD-10-CM

## 2023-05-25 DIAGNOSIS — J439 Emphysema, unspecified: Secondary | ICD-10-CM

## 2023-05-25 DIAGNOSIS — F17210 Nicotine dependence, cigarettes, uncomplicated: Secondary | ICD-10-CM

## 2023-05-25 DIAGNOSIS — I69351 Hemiplegia and hemiparesis following cerebral infarction affecting right dominant side: Secondary | ICD-10-CM

## 2023-05-25 DIAGNOSIS — E782 Mixed hyperlipidemia: Secondary | ICD-10-CM

## 2023-05-25 DIAGNOSIS — E669 Obesity, unspecified: Secondary | ICD-10-CM

## 2023-05-25 DIAGNOSIS — E871 Hypo-osmolality and hyponatremia: Secondary | ICD-10-CM

## 2023-05-25 DIAGNOSIS — I10 Essential (primary) hypertension: Secondary | ICD-10-CM

## 2023-05-25 DIAGNOSIS — G40909 Epilepsy, unspecified, not intractable, without status epilepticus: Secondary | ICD-10-CM

## 2023-05-25 DIAGNOSIS — F1010 Alcohol abuse, uncomplicated: Secondary | ICD-10-CM

## 2023-06-03 ENCOUNTER — Telehealth: Payer: Self-pay | Admitting: Nurse Practitioner

## 2023-06-03 NOTE — Telephone Encounter (Signed)
Home Health Verbal Orders - Caller/Agency: Shirleen with [facility] Callback Number: [phone] Requesting: OT Frequency: 1w3x

## 2023-06-04 NOTE — Telephone Encounter (Signed)
Called and LVM giving verbal orders per Jolene.  ?

## 2023-06-22 ENCOUNTER — Ambulatory Visit: Payer: Medicaid Other | Admitting: Nurse Practitioner

## 2023-06-22 DIAGNOSIS — E871 Hypo-osmolality and hyponatremia: Secondary | ICD-10-CM

## 2023-06-22 DIAGNOSIS — I10 Essential (primary) hypertension: Secondary | ICD-10-CM

## 2023-06-22 DIAGNOSIS — J439 Emphysema, unspecified: Secondary | ICD-10-CM

## 2023-06-22 DIAGNOSIS — I69351 Hemiplegia and hemiparesis following cerebral infarction affecting right dominant side: Secondary | ICD-10-CM

## 2023-06-22 DIAGNOSIS — Z8673 Personal history of transient ischemic attack (TIA), and cerebral infarction without residual deficits: Secondary | ICD-10-CM

## 2023-06-22 DIAGNOSIS — E782 Mixed hyperlipidemia: Secondary | ICD-10-CM

## 2023-06-22 DIAGNOSIS — G40909 Epilepsy, unspecified, not intractable, without status epilepticus: Secondary | ICD-10-CM

## 2023-06-22 DIAGNOSIS — F17210 Nicotine dependence, cigarettes, uncomplicated: Secondary | ICD-10-CM

## 2023-06-22 DIAGNOSIS — F1010 Alcohol abuse, uncomplicated: Secondary | ICD-10-CM

## 2023-06-22 DIAGNOSIS — E669 Obesity, unspecified: Secondary | ICD-10-CM

## 2023-08-11 ENCOUNTER — Ambulatory Visit: Payer: Self-pay

## 2023-08-11 NOTE — Telephone Encounter (Signed)
  Chief Complaint: knee pain Symptoms: knee pain Frequency: ongoing Pertinent Negatives: Patient denies  Disposition: [] ED /[] Urgent Care (no appt availability in office) / [x] Appointment(In office/virtual)/ []  [place] [facility]/ [] Home Care/ [] Refused Recommended Disposition /[] [place] [facility]/ []  Follow-up with PCP Additional Notes: Pt states that knee pain is 10/10 and is ongoing. Pt states that he has arthritis, and it it "bone on bone".  Pt states pain is worse when it rains.    Reason for Disposition  [1] MODERATE pain (e.g., interferes with normal activities, limping) AND [2] present > 3 days  Answer Assessment - Initial Assessment Questions 1. LOCATION and RADIATION: "Where is the pain located?"      Both knees 2. QUALITY: "What does the pain feel like?"  (e.g., sharp, dull, aching, burning)     *No Answer* 3. SEVERITY: "How bad is the pain?" "What does it keep you from doing?"   (Scale 1-10; or mild, moderate, severe)   -  MILD (1-3): doesn't interfere with normal activities    -  MODERATE (4-7): interferes with normal activities (e.g., work or school) or awakens from sleep, limping    -  SEVERE (8-10): excruciating pain, unable to do any normal activities, unable to walk     10/10 4. ONSET: "When did the pain start?" "Does it come and go, or is it there all the time?"     Ongoing 5. RECURRENT: "Have you had this pain before?" If Yes, ask: "When, and what happened then?"     yes 6. SETTING: "Has there been any recent work, exercise or other activity that involved that part of the body?"      No - hurts more when it rains 7. AGGRAVATING FACTORS: "What makes the knee pain worse?" (e.g., walking, climbing stairs, running)     When it rains 8. ASSOCIATED SYMPTOMS: "Is there any swelling or redness of the knee?"     Yes - redness  Protocols used: Knee Pain-A-AH

## 2023-08-12 ENCOUNTER — Encounter: Payer: Self-pay | Admitting: Nurse Practitioner

## 2023-08-12 ENCOUNTER — Ambulatory Visit: Payer: Medicaid Other | Admitting: Nurse Practitioner

## 2023-08-12 VITALS — BP 129/89 | HR 86 | Temp 98.4°F | Wt 237.2 lb

## 2023-08-12 DIAGNOSIS — G8929 Other chronic pain: Secondary | ICD-10-CM

## 2023-08-12 DIAGNOSIS — R7309 Other abnormal glucose: Secondary | ICD-10-CM | POA: Diagnosis not present

## 2023-08-12 DIAGNOSIS — I69351 Hemiplegia and hemiparesis following cerebral infarction affecting right dominant side: Secondary | ICD-10-CM

## 2023-08-12 DIAGNOSIS — I10 Essential (primary) hypertension: Secondary | ICD-10-CM

## 2023-08-12 DIAGNOSIS — Z8673 Personal history of transient ischemic attack (TIA), and cerebral infarction without residual deficits: Secondary | ICD-10-CM

## 2023-08-12 DIAGNOSIS — Z23 Encounter for immunization: Secondary | ICD-10-CM

## 2023-08-12 DIAGNOSIS — E871 Hypo-osmolality and hyponatremia: Secondary | ICD-10-CM

## 2023-08-12 DIAGNOSIS — Z1211 Encounter for screening for malignant neoplasm of colon: Secondary | ICD-10-CM

## 2023-08-12 DIAGNOSIS — F17210 Nicotine dependence, cigarettes, uncomplicated: Secondary | ICD-10-CM

## 2023-08-12 DIAGNOSIS — M25561 Pain in right knee: Secondary | ICD-10-CM

## 2023-08-12 DIAGNOSIS — E782 Mixed hyperlipidemia: Secondary | ICD-10-CM

## 2023-08-12 DIAGNOSIS — F1010 Alcohol abuse, uncomplicated: Secondary | ICD-10-CM

## 2023-08-12 DIAGNOSIS — E669 Obesity, unspecified: Secondary | ICD-10-CM

## 2023-08-12 LAB — BAYER DCA HB A1C WAIVED: HB A1C (BAYER DCA - WAIVED): 5.5 % (ref 4.8–5.6)

## 2023-08-12 MED ORDER — CLOPIDOGREL BISULFATE 75 MG PO TABS: 75.0000 mg | ORAL_TABLET | Freq: Every day | ORAL | 4 refills | Status: DC

## 2023-08-12 MED ORDER — PANTOPRAZOLE SODIUM 40 MG PO TBEC: 40.0000 mg | DELAYED_RELEASE_TABLET | Freq: Every day | ORAL | 4 refills | Status: DC

## 2023-08-12 MED ORDER — OLMESARTAN MEDOXOMIL-HCTZ 40-12.5 MG PO TABS: 1.0000 | ORAL_TABLET | Freq: Every day | ORAL | 4 refills | Status: DC

## 2023-08-12 MED ORDER — METOPROLOL SUCCINATE ER 50 MG PO TB24: 50.0000 mg | ORAL_TABLET | Freq: Every day | ORAL | 4 refills | Status: DC

## 2023-08-12 MED ORDER — ATORVASTATIN CALCIUM 80 MG PO TABS: 80.0000 mg | ORAL_TABLET | Freq: Every day | ORAL | 4 refills | Status: DC

## 2023-08-12 MED ORDER — AMLODIPINE BESYLATE 10 MG PO TABS: 10.0000 mg | ORAL_TABLET | Freq: Every day | ORAL | 4 refills | Status: DC

## 2023-08-12 MED ORDER — VITAMIN B-1 100 MG PO TABS: 100.0000 mg | ORAL_TABLET | Freq: Every day | ORAL | 4 refills | Status: DC

## 2023-08-12 NOTE — Assessment & Plan Note (Signed)
I have recommended complete cessation of tobacco use. I have discussed various options available for assistance with tobacco cessation including over the counter methods (Nicotine gum, patch and lozenges). We also discussed prescription options (Chantix, Nicotine Inhaler / Nasal Spray). The patient is not interested in pursuing any prescription tobacco cessation options at this time.  Referral for lung screening, discussed with patient.  He wishes to hold off.

## 2023-08-12 NOTE — Assessment & Plan Note (Signed)
Chronic, ongoing issue.  He has cut back, however recommend complete cessation of alcohol use to help prevent elevation in BP and stroke.

## 2023-08-12 NOTE — Assessment & Plan Note (Signed)
Chronic, ongoing.  Continue current medication regimen and adjust as needed. Lipid panel today. 

## 2023-08-12 NOTE — Assessment & Plan Note (Signed)
Chronic, ongoing issue with MRI left knee in hospital noting diffuse degeneration and radial tearing of the lateral meniscus + severe OA & previous right knee imaging noting severe OA.  New referral to ortho placed and highly recommend he attend this for further assessment and recommendations.

## 2023-08-12 NOTE — Assessment & Plan Note (Signed)
Noted on last hospital labs, recheck today and recommend complete cessation of alcohol use.

## 2023-08-12 NOTE — Patient Instructions (Signed)

## 2023-08-12 NOTE — Assessment & Plan Note (Signed)
Three CVA in 2023.  Continue to collaborate with neurology.  Has family history of CVA, brother.  Recommend continue all current medications and discussed neuro goals: A1c <6.5%, LDL <55, and BP <130/80.  High risk for recurrent CVA, monitor closely and recommend complete cessation of smoking and alcohol use.

## 2023-08-12 NOTE — Assessment & Plan Note (Signed)
Chronic and improved today.  BP close to goal in office.  Recommend he monitor BP at least a few mornings a week at home and document.  DASH diet at home.  Continue current medication regimen and adjust as needed.  Labs today: CMP.

## 2023-08-12 NOTE — Progress Notes (Signed)
BP 129/89   Pulse 86   Temp 98.4 F (36.9 C) (Oral)   Wt 237 lb 3.2 oz (107.6 kg)   SpO2 99%   BMI 30.44 kg/m    Subjective:    Patient ID: James Lambert, male    DOB: 1966/11/16, 57 y.o.   MRN: [id number]  HPI: James Lambert is a 57 y.o. male  Chief Complaint  Patient presents with   Knee Pain    Pt states he has been having bilateral knee pain for a while. States pain has gotten worse recently, rating pain at a 10. States his knees hurt worse while walking or standing.    HYPERTENSION / HYPERLIPIDEMIA Continues on Amlodipine, Olmesartan-hydrochlorothiazide, Metoprolol, Plavix, and Atorvastatin. History of CVA in June 2023 -- continues to have some right side weakness.  Had some alcohol for his birthday -- split a 5th with brother and friends.  Has not drank since -- 08/07/23.  Continues to smoke, but has cut back to 5 a day. Satisfied with current treatment? yes Duration of hypertension: chronic BP monitoring frequency: not checking BP range:  BP medication side effects: no Duration of hyperlipidemia: chronic Cholesterol medication side effects: no Cholesterol supplements: none Medication compliance: good compliance Aspirin: no Recent stressors: no Recurrent headaches: no Visual changes: no Palpitations: no Dyspnea: no Chest pain: no Lower extremity edema: yes at baseline Dizzy/lightheaded: no   Impaired Fasting Glucose HbA1C:  Lab Results  Component Value Date   HGBA1C 6.0 (H) 03/27/2023  Duration of elevated blood sugar:  Polydipsia: no Polyuria: no Weight change: no Visual disturbance: no Glucose Monitoring: no    Accucheck frequency: Not Checking    Fasting glucose:     Post prandial:  Diabetic Education: Not Completed Family history of diabetes: yes   KNEE PAIN To both knees, chronic and makes difficult to walk -- years.  Had referral to ortho 12/22/22 and was scheduled, but missed visit due to health issues.  Pain has gotten worse with the rain  and season changes.  Had imaging on 04/24/23 noted bone on bone both knees. Duration: chronic Involved knee: bilateral Mechanism of injury: unknown Location:anterior Onset: gradual Severity: 10/10  Quality:  dull, aching, and throbbing -- legs start jumping at night and are painful Frequency: constant -- when stands up it is worse Radiation: no Aggravating factors: weight bearing, walking, and movement  Alleviating factors: rest  Status: worse Treatments attempted: Tylenol, Icy/Hot, rest  Relief with NSAIDs?:  No NSAIDs Taken Weakness with weight bearing or walking: yes Sensation of giving way: yes Locking: yes Popping: yes Bruising: no Swelling: yes Redness: no Paresthesias/decreased sensation: no Fevers: no   Relevant past medical, surgical, family and social history reviewed and updated as indicated. Interim medical history since our last visit reviewed. Allergies and medications reviewed and updated.  Review of Systems  Constitutional:  Negative for activity change, diaphoresis, fatigue and fever.  Respiratory:  Negative for cough, chest tightness, shortness of breath and wheezing.   Cardiovascular:  Negative for chest pain, palpitations and leg swelling.  Gastrointestinal: Negative.   Musculoskeletal:  Positive for arthralgias.  Neurological: Negative.   Psychiatric/Behavioral: Negative.      Per HPI unless specifically indicated above     Objective:    BP 129/89   Pulse 86   Temp 98.4 F (36.9 C) (Oral)   Wt 237 lb 3.2 oz (107.6 kg)   SpO2 99%   BMI 30.44 kg/m   Wt Readings from Last 3 Encounters:  08/12/23 237 lb 3.2 oz (107.6 kg)  04/24/23 242 lb 8.1 oz (110 kg)  04/15/23 242 lb 6.4 oz (110 kg)    Physical Exam Vitals and nursing note reviewed.  Constitutional:      General: He is awake. He is not in acute distress.    Appearance: He is well-developed and well-groomed. He is obese. He is not ill-appearing or toxic-appearing.  HENT:     Head:  Normocephalic.     Right Ear: Hearing and external ear normal.     Left Ear: Hearing and external ear normal.  Eyes:     General: Lids are normal.     Extraocular Movements: Extraocular movements intact.     Conjunctiva/sclera: Conjunctivae normal.  Neck:     Thyroid: No thyromegaly.     Vascular: No carotid bruit.  Cardiovascular:     Rate and Rhythm: Normal rate and regular rhythm.     Heart sounds: Normal heart sounds. No murmur heard.    No gallop.  Pulmonary:     Effort: No accessory muscle usage or respiratory distress.     Breath sounds: Normal breath sounds.  Abdominal:     General: Bowel sounds are normal. There is no distension.     Palpations: Abdomen is soft.     Tenderness: There is no abdominal tenderness.  Musculoskeletal:     Cervical back: Full passive range of motion without pain.     Right knee: Swelling and crepitus present. No lacerations or bony tenderness. Decreased range of motion. Tenderness present over the patellar tendon.     Left knee: Swelling and crepitus present. No lacerations or bony tenderness. Decreased range of motion. Tenderness present over the patellar tendon.     Right lower leg: No edema.     Left lower leg: No edema.     Comments: In electric W/C today due to pain.  Lymphadenopathy:     Cervical: No cervical adenopathy.  Skin:    General: Skin is warm.     Capillary Refill: Capillary refill takes less than 2 seconds.  Neurological:     Mental Status: He is alert and oriented to person, place, and time.     Deep Tendon Reflexes: Reflexes are normal and symmetric.     Reflex Scores:      Brachioradialis reflexes are 2+ on the right side and 2+ on the left side.      Patellar reflexes are 2+ on the right side and 2+ on the left side. Psychiatric:        Attention and Perception: Attention normal.        Mood and Affect: Mood normal.        Speech: Speech normal.        Behavior: Behavior normal. Behavior is cooperative.         Thought Content: Thought content normal.     Results for orders placed or performed during the hospital encounter of 04/24/23  CBC  Result Value Ref Range   WBC 6.7 4.0 - 10.5 K/uL   RBC 4.73 4.22 - 5.81 MIL/uL   Hemoglobin 13.5 13.0 - 17.0 g/dL   HCT 40.9 81.1 - 91.4 %   MCV 86.0 80.0 - 100.0 fL   MCH 28.5 26.0 - 34.0 pg   MCHC 33.2 30.0 - 36.0 g/dL   RDW 78.2 (H) 95.6 - 21.3 %   Platelets 428 (H) 150 - 400 K/uL   nRBC 0.0 0.0 - 0.2 %  Comprehensive metabolic panel  Result Value Ref Range   Sodium 129 (L) 135 - 145 mmol/L   Potassium 3.8 3.5 - 5.1 mmol/L   Chloride 93 (L) 98 - 111 mmol/L   CO2 22 22 - 32 mmol/L   Glucose, Bld 105 (H) 70 - 99 mg/dL   BUN 22 (H) 6 - 20 mg/dL   Creatinine, Ser 8.29 (H) 0.61 - 1.24 mg/dL   Calcium 9.4 8.9 - 56.2 mg/dL   Total Protein 9.3 (H) 6.5 - 8.1 g/dL   Albumin 4.1 3.5 - 5.0 g/dL   AST 19 15 - 41 U/L   ALT 13 0 - 44 U/L   Alkaline Phosphatase 110 38 - 126 U/L   Total Bilirubin 0.6 0.3 - 1.2 mg/dL   GFR, Estimated 49 (L) >60 mL/min   Anion gap 14 5 - 15  Urinalysis, Complete w Microscopic -Urine, Clean Catch  Result Value Ref Range   Color, Urine YELLOW (A) YELLOW   APPearance CLEAR (A) CLEAR   Specific Gravity, Urine 1.013 1.005 - 1.030   pH 5.0 5.0 - 8.0   Glucose, UA NEGATIVE NEGATIVE mg/dL   Hgb urine dipstick NEGATIVE NEGATIVE   Bilirubin Urine NEGATIVE NEGATIVE   Ketones, ur NEGATIVE NEGATIVE mg/dL   Protein, ur NEGATIVE NEGATIVE mg/dL   Nitrite NEGATIVE NEGATIVE   Leukocytes,Ua NEGATIVE NEGATIVE   RBC / HPF 0-5 0 - 5 RBC/hpf   WBC, UA 0-5 0 - 5 WBC/hpf   Bacteria, UA NONE SEEN NONE SEEN   Squamous Epithelial / HPF 0-5 0 - 5 /HPF   Mucus PRESENT    Hyaline Casts, UA PRESENT   Urine Drug Screen, Qualitative ([facility] only)  Result Value Ref Range   Tricyclic, Ur Screen NONE DETECTED NONE DETECTED   Amphetamines, Ur Screen NONE DETECTED NONE DETECTED   MDMA (Ecstasy)Ur Screen NONE DETECTED NONE DETECTED   Cocaine  Metabolite,Ur [place] NONE DETECTED NONE DETECTED   Opiate, Ur Screen NONE DETECTED NONE DETECTED   Phencyclidine (PCP) Ur S NONE DETECTED NONE DETECTED   Cannabinoid 50 Ng, Ur [place] POSITIVE (A) NONE DETECTED   Barbiturates, Ur Screen NONE DETECTED NONE DETECTED   Benzodiazepine, Ur Scrn POSITIVE (A) NONE DETECTED   Methadone Scn, Ur NONE DETECTED NONE DETECTED  Ethanol  Result Value Ref Range   Alcohol, Ethyl (B) <10 <10 mg/dL  Magnesium  Result Value Ref Range   Magnesium 1.8 1.7 - 2.4 mg/dL  Phosphorus  Result Value Ref Range   Phosphorus 4.0 2.5 - 4.6 mg/dL  HIV Antibody (routine testing w rflx)  Result Value Ref Range   HIV Screen 4th Generation wRfx Non Reactive Non Reactive  Basic metabolic panel  Result Value Ref Range   Sodium 129 (L) 135 - 145 mmol/L   Potassium 3.3 (L) 3.5 - 5.1 mmol/L   Chloride 96 (L) 98 - 111 mmol/L   CO2 25 22 - 32 mmol/L   Glucose, Bld 110 (H) 70 - 99 mg/dL   BUN 32 (H) 6 - 20 mg/dL   Creatinine, Ser 1.30 (H) 0.61 - 1.24 mg/dL   Calcium 8.7 (L) 8.9 - 10.3 mg/dL   GFR, Estimated 44 (L) >60 mL/min   Anion gap 8 5 - 15  CBC  Result Value Ref Range   WBC 6.4 4.0 - 10.5 K/uL   RBC 4.12 (L) 4.22 - 5.81 MIL/uL   Hemoglobin 11.7 (L) 13.0 - 17.0 g/dL   HCT 86.5 (L) 78.4 - 69.6 %   MCV 87.1 80.0 - 100.0  fL   MCH 28.4 26.0 - 34.0 pg   MCHC 32.6 30.0 - 36.0 g/dL   RDW 09.8 (H) 11.9 - 14.7 %   Platelets 342 150 - 400 K/uL   nRBC 0.0 0.0 - 0.2 %  Basic metabolic panel  Result Value Ref Range   Sodium 134 (L) 135 - 145 mmol/L   Potassium 3.9 3.5 - 5.1 mmol/L   Chloride 103 98 - 111 mmol/L   CO2 24 22 - 32 mmol/L   Glucose, Bld 112 (H) 70 - 99 mg/dL   BUN 25 (H) 6 - 20 mg/dL   Creatinine, Ser 8.29 (H) 0.61 - 1.24 mg/dL   Calcium 8.7 (L) 8.9 - 10.3 mg/dL   GFR, Estimated 58 (L) >60 mL/min   Anion gap 7 5 - 15  Magnesium  Result Value Ref Range   Magnesium 1.7 1.7 - 2.4 mg/dL  Phosphorus  Result Value Ref Range   Phosphorus 3.4 2.5 - 4.6 mg/dL   Basic metabolic panel  Result Value Ref Range   Sodium 133 (L) 135 - 145 mmol/L   Potassium 4.2 3.5 - 5.1 mmol/L   Chloride 102 98 - 111 mmol/L   CO2 23 22 - 32 mmol/L   Glucose, Bld 148 (H) 70 - 99 mg/dL   BUN 15 6 - 20 mg/dL   Creatinine, Ser 5.62 0.61 - 1.24 mg/dL   Calcium 9.0 8.9 - 13.0 mg/dL   GFR, Estimated >86 >57 mL/min   Anion gap 8 5 - 15  CBG monitoring, ED  Result Value Ref Range   Glucose-Capillary 91 70 - 99 mg/dL  Troponin I (High Sensitivity)  Result Value Ref Range   Troponin I (High Sensitivity) 4 <18 ng/L      Assessment & Plan:   Problem List Items Addressed This Visit       Cardiovascular and Mediastinum   Hypertension    Chronic and improved today.  BP close to goal in office.  Recommend he monitor BP at least a few mornings a week at home and document.  DASH diet at home.  Continue current medication regimen and adjust as needed.  Labs today: CMP.          Relevant Medications   amLODipine (NORVASC) 10 MG tablet   atorvastatin (LIPITOR) 80 MG tablet   metoprolol succinate (TOPROL-XL) 50 MG 24 hr tablet   olmesartan-hydrochlorothiazide (BENICAR HCT) 40-12.5 MG tablet   Other Relevant Orders   Comprehensive metabolic panel     Nervous and Auditory   Hemiparesis affecting right side as late effect of stroke (HCC) - Primary    Ongoing, mild to right side post CVA.  Did attend PT for this. May need to return in future if any changes present.  At this time continue current stroke prevention medication regimen and collaboration with neurology.        Other   Alcohol abuse    Chronic, ongoing issue.  He has cut back, however recommend complete cessation of alcohol use to help prevent elevation in BP and stroke.        Bilateral chronic knee pain    Chronic, ongoing issue with MRI left knee in hospital noting diffuse degeneration and radial tearing of the lateral meniscus + severe OA & previous right knee imaging noting severe OA.  New referral to  ortho placed and highly recommend he attend this for further assessment and recommendations.      Relevant Orders   Ambulatory referral to Orthopedic  Surgery   Elevated hemoglobin A1c measurement    A1c trend down today to 5.5%, praised for this.  Continue diet and exercise focus.      Relevant Orders   Bayer DCA Hb A1c Waived   History of CVA (cerebrovascular accident)    Three CVA in 2023.  Continue to collaborate with neurology.  Has family history of CVA, brother.  Recommend continue all current medications and discussed neuro goals: A1c <6.5%, LDL <55, and BP <130/80.  High risk for recurrent CVA, monitor closely and recommend complete cessation of smoking and alcohol use.        Hyponatremia    Noted on last hospital labs, recheck today and recommend complete cessation of alcohol use.      Mixed hyperlipidemia    Chronic, ongoing.  Continue current medication regimen and adjust as needed.  Lipid panel today.       Relevant Medications   amLODipine (NORVASC) 10 MG tablet   atorvastatin (LIPITOR) 80 MG tablet   metoprolol succinate (TOPROL-XL) 50 MG 24 hr tablet   olmesartan-hydrochlorothiazide (BENICAR HCT) 40-12.5 MG tablet   Other Relevant Orders   Comprehensive metabolic panel   Lipid Panel w/o Chol/HDL Ratio   Nicotine dependence, cigarettes, uncomplicated    I have recommended complete cessation of tobacco use. I have discussed various options available for assistance with tobacco cessation including over the counter methods (Nicotine gum, patch and lozenges). We also discussed prescription options (Chantix, Nicotine Inhaler / Nasal Spray). The patient is not interested in pursuing any prescription tobacco cessation options at this time.  Referral for lung screening, discussed with patient.  He wishes to hold off.       Obesity (BMI 30-39.9)   Other Visit Diagnoses     Need for influenza vaccination       Flu vaccine in office today, educated on this.   Relevant  Orders   Flu vaccine trivalent PF, 6mos and older(Flulaval,Afluria,Fluarix,Fluzone) (Completed)   Colon cancer screening       GI referral placed.   Relevant Orders   Ambulatory referral to Gastroenterology        Follow up plan: Return in about 3 months (around 11/11/2023) for KNEE PAIN.

## 2023-08-12 NOTE — Assessment & Plan Note (Signed)
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  Did attend PT for this. May need to return in future if any changes present.  At this time continue current stroke prevention medication regimen and collaboration with neurology.

## 2023-08-12 NOTE — Assessment & Plan Note (Signed)
A1c trend down today to 5.5%, praised for this.  Continue diet and exercise focus.

## 2023-08-13 ENCOUNTER — Other Ambulatory Visit: Payer: Self-pay | Admitting: Nurse Practitioner

## 2023-08-13 ENCOUNTER — Telehealth: Payer: Self-pay | Admitting: Nurse Practitioner

## 2023-08-13 DIAGNOSIS — G40909 Epilepsy, unspecified, not intractable, without status epilepticus: Secondary | ICD-10-CM

## 2023-08-13 DIAGNOSIS — N179 Acute kidney failure, unspecified: Secondary | ICD-10-CM

## 2023-08-13 DIAGNOSIS — I69351 Hemiplegia and hemiparesis following cerebral infarction affecting right dominant side: Secondary | ICD-10-CM

## 2023-08-13 LAB — LIPID PANEL W/O CHOL/HDL RATIO
Cholesterol, Total: 134 mg/dL (ref 100–199)
HDL: 40 mg/dL (ref >39)
LDL Chol Calc (NIH): 79 mg/dL (ref 0–99)
Triglycerides: 72 mg/dL (ref 0–149)
VLDL Cholesterol Cal: 15 mg/dL (ref 5–40)

## 2023-08-13 LAB — COMPREHENSIVE METABOLIC PANEL
ALT: 5 IU/L (ref 0–44)
AST: 11 IU/L (ref 0–40)
Albumin: 3.8 g/dL (ref 3.8–4.9)
Alkaline Phosphatase: 118 IU/L (ref 44–121)
BUN/Creatinine Ratio: 8 — ABNORMAL LOW (ref 9–20)
BUN: 16 mg/dL (ref 6–24)
Bilirubin Total: 0.7 mg/dL (ref 0.0–1.2)
CO2: 22 mmol/L (ref 20–29)
Calcium: 8.8 mg/dL (ref 8.7–10.2)
Chloride: 102 mmol/L (ref 96–106)
Creatinine, Ser: 2.05 mg/dL — ABNORMAL HIGH (ref 0.76–1.27)
Globulin, Total: 3.9 g/dL (ref 1.5–4.5)
Glucose: 90 mg/dL (ref 70–99)
Potassium: 3.9 mmol/L (ref 3.5–5.2)
Sodium: 140 mmol/L (ref 134–144)
Total Protein: 7.7 g/dL (ref 6.0–8.5)
eGFR: 37 mL/min/{1.73_m2} — ABNORMAL LOW (ref >59)

## 2023-08-13 NOTE — Telephone Encounter (Addendum)
Pt states he needs an aid.  Pt states his insurance company advised him that his dr should have the necessary paperwork he needs in order to get an aid. pt transferred to NT for his lab results

## 2023-08-13 NOTE — Progress Notes (Signed)
Good afternoon, please let James Lambert know his labs have returned and his kidney function has had a decline.  I would like to recheck this in one week via outpatient labs please, if remains this level would recommend we get you into kidney doctor.  Please ensure to increase water intake over the next week.  Please schedule lab only visit for one week.  Any questions? Keep being stellar!!  Thank you for allowing me to participate in your care.  I appreciate you. Kindest regards, Lorelle Macaluso

## 2023-08-13 NOTE — Telephone Encounter (Signed)
Called and LVM asking for patient to please return my call.   OK for PEC to speak with patient and find out more information about what exactly it is that he is requesting. Patient also has a result note in chart for lab results as well.

## 2023-08-13 NOTE — Telephone Encounter (Signed)
Copied from CRM [id number]. Topic: General - Other >> Aug 13, 2023  9:01 AM Turkey B wrote: Reason for CRM: pt called in about summary paperwork for his knees at his appt he had yesterday.

## 2023-08-14 ENCOUNTER — Telehealth: Payer: Self-pay

## 2023-08-14 DIAGNOSIS — Z1211 Encounter for screening for malignant neoplasm of colon: Secondary | ICD-10-CM

## 2023-08-14 NOTE — Telephone Encounter (Signed)
Gastroenterology Pre-Procedure Review  Request Date: TBD Requesting Physician: Dr. Jodelle Gross  PATIENT REVIEW QUESTIONS: The patient responded to the following health history questions as indicated:    1. Are you having any GI issues? no 2. Do you have a personal history of Polyps? no 3. Do you have a family history of Colon Cancer or Polyps? no 4. Diabetes Mellitus? no 5. Joint replacements in the past 12 months?no 6. Major health problems in the past 3 months?no 7. Any artificial heart valves, MVP, or defibrillator?no    MEDICATIONS & ALLERGIES:    Patient reports the following regarding taking any anticoagulation/antiplatelet therapy:   Plavix, Coumadin, Eliquis, Xarelto, Lovenox, Pradaxa, Brilinta, or Effient? yes (patient takes Plavix blood thinner request sent to [facility] and medical clearance sent due to stroke 1 year ago) Aspirin? no  Patient confirms/reports the following medications:  Current Outpatient Medications  Medication Sig Dispense Refill   acetaminophen (TYLENOL) 325 MG tablet Take 2 tablets (650 mg total) by mouth every 6 (six) hours as needed for mild pain. 120 tablet 12   albuterol (VENTOLIN HFA) 108 (90 Base) MCG/ACT inhaler Inhale 1-2 puffs into the lungs every 6 (six) hours as needed for wheezing or shortness of breath. 18 g 3   amLODipine (NORVASC) 10 MG tablet Take 1 tablet (10 mg total) by mouth daily. 90 tablet 4   atorvastatin (LIPITOR) 80 MG tablet Take 1 tablet (80 mg total) by mouth daily. 90 tablet 4   clopidogrel (PLAVIX) 75 MG tablet Take 1 tablet (75 mg total) by mouth daily. 90 tablet 4   metoprolol succinate (TOPROL-XL) 50 MG 24 hr tablet Take 1 tablet (50 mg total) by mouth daily with or immediately following a meal. 90 tablet 4   Multiple Vitamin (MULTIVITAMIN WITH MINERALS) TABS tablet Take 1 tablet by mouth daily. 30 tablet 0   olmesartan-hydrochlorothiazide (BENICAR HCT) 40-12.5 MG tablet Take 1 tablet by mouth daily. 90 tablet 4    pantoprazole (PROTONIX) 40 MG tablet Take 1 tablet (40 mg total) by mouth at bedtime. 90 tablet 4   thiamine (VITAMIN B-1) 100 MG tablet Take 1 tablet (100 mg total) by mouth daily. 90 tablet 4   umeclidinium-vilanterol (ANORO ELLIPTA) 62.5-25 MCG/ACT AEPB Inhale 1 puff into the lungs daily at 6 (six) AM. 60 each 4   No current facility-administered medications for this visit.    Patient confirms/reports the following allergies:  No Known Allergies  No orders of the defined types were placed in this encounter.   AUTHORIZATION INFORMATION Primary Insurance: 1D#: Group #:  Secondary Insurance: 1D#: Group #:  SCHEDULE INFORMATION: Date: TBD Time: Location: [facility]

## 2023-08-14 NOTE — Addendum Note (Signed)
Addended by: Aura Dials T on: 08/14/2023 03:28 PM   Modules accepted: Orders

## 2023-08-14 NOTE — Telephone Encounter (Signed)
Called and spoke to patient. He states that he is wanting an in home aid to assist with ADL's.

## 2023-08-15 ENCOUNTER — Other Ambulatory Visit: Payer: Self-pay

## 2023-08-18 ENCOUNTER — Telehealth: Payer: Self-pay | Admitting: Nurse Practitioner

## 2023-08-18 ENCOUNTER — Other Ambulatory Visit: Payer: Medicaid Other | Admitting: Licensed Clinical Social Worker

## 2023-08-18 NOTE — Telephone Encounter (Signed)
James Lambert called from [facility] to f/u on the clearance form before she schedule patient for his colonoscopy. Please f/u with James Lambert at [phone].

## 2023-08-18 NOTE — Patient Outreach (Signed)
Medicaid Managed Care Social Work Note  08/18/2023 Name:  James Lambert MRN:  [id number] DOB:  08-May-1966  James Lambert is an 57 y.o. year old male who is a primary patient of Cannady, Dorie Rank, NP.  The [facility] Managed Care Coordination team was consulted for assistance with:   Substance Abuse Resources  [name] was given information about Medicaid Managed Care Coordination team services today. James Lambert Patient agreed to services and verbal consent obtained. Patient agreed to [facility] program and nursing involvement but does not with to address any behavioral health or substance abuse needs with James Lambert at this time. He reports the only social work need he wishes to gain assistance with is food support and these resources were mailed to his residence today per his request.   Engaged with patient  for by telephone forinitial visit in response to referral for case management and/or care coordination services.   Assessments/Interventions:  Review of past medical history, allergies, medications, health status, including review of consultants reports, laboratory and other test data, was performed as part of comprehensive evaluation and provision of chronic care management services. Patient reports that his two brothers are his main source of support.   SDOH: (Social Determinant of Health) assessments and interventions performed: SDOH Interventions    Flowsheet Row Patient Outreach Telephone from 08/18/2023 in [place] POPULATION HEALTH DEPARTMENT Patient Outreach from 03/09/2023 in [place] POPULATION HEALTH DEPARTMENT Patient Outreach Telephone from 02/25/2023 in [place] POPULATION HEALTH DEPARTMENT Office Visit from 09/10/2022 in [facility]  SDOH Interventions      Food Insecurity Interventions AMB Referral  [Food support resources mailed to pt] -- -- --  Transportation Interventions -- -- Intervention Not Indicated --  Alcohol Usage Interventions -- -- --  Engineer, maintenance (IT) Strain Interventions -- Other (Comment)  [Working with [facility]] -- --  Stress Interventions Offered [employer], Provide Counseling  [Pt denies alcohol resources and BH resources] -- [employer], Provide Counseling --       Advanced Directives Status:  Not addressed in this encounter.  Care Plan                 No Known Allergies  Medications Reviewed Today   Medications were not reviewed in this encounter     Patient Active Problem List   Diagnosis Date Noted   Complex tear of meniscus of right knee 04/25/2023   Hyponatremia 04/24/2023   Obesity (BMI 30-39.9) 04/24/2023   Elevated hemoglobin A1c measurement 09/11/2022   Hemiparesis affecting right side as late effect of stroke (HCC) 09/10/2022   Bilateral chronic knee pain 09/10/2022   Aortic atherosclerosis (HCC) 09/10/2022   GERD without esophagitis 08/30/2022   Mixed hyperlipidemia 08/30/2022   Alcohol abuse 08/30/2022   Seizure cerebral (HCC) 05/10/2022   Hypertension    Nicotine dependence, cigarettes, uncomplicated    Hydradenitis    COPD (chronic obstructive pulmonary disease) (HCC)    History of CVA (cerebrovascular accident) 03/08/2022      24- Hour Availability:    [facility]  [address] [place], [place] [phone] Crisis [phone]   [facility] [phone]  [place] [facility]  [phone]    [facility]  [phone] (after hours)   Therapeutic Alternative/Mobile Crisis   [phone]   [place] National Suicide Hotline  330-430-2281 (TALK) OR (873) 246-1497   Call 988 for  mental health emergencies   [facility]  [phone]);  [place] and [employer]  [phone]); [place], [place], [place], [place], [place], [place], [place]    [place] [facility] Care for  [facility] Residents For 24/7 walk-up access to mental health services for [facility] children (4+), adolescents and adults, please visit the [facility] located at [address] in [place], [place].  *[place] also provides comprehensive outpatient behavioral health services in a variety of locations around the [place].  Connect With [place] [address] [place], [place] [address] HelpLine: 954-233-7945 or 1-930-818-9556  Get Directions  Find Help 24/7 By Phone Call our 24-hour HelpLine at 813-323-4821 or (478) 072-5016 for immediate assistance for mental health and substance abuse issues.  Walk-In Help [place]: [facility] (Ages 4 and Up) [place]: Emergency Dept., [facility] Additional Resources National Hopeline Network: 1-800-SUICIDE The National Suicide Prevention Lifeline: 1-800-273-TALK     Follow up:  [facility] RNCM will complete initial call on 08/20/23 at 2:30 pm.   Plan: The Managed Medicaid care management team will reach out to the patient again over the next 30 days.  Dickie La, BSW, MSW, [employer] Managed Medicaid [facility]  [facility] [email or web address] Phone: [phone]

## 2023-08-18 NOTE — Patient Instructions (Signed)
Visit Information  [name] was given information about Medicaid Managed Care team care coordination services as a part of their Healthy [facility] Medicaid benefit. James Lambert verbally consented to engagement with the [facility] Managed Care team.   If you are experiencing a medical emergency, please call 911 or report to your local emergency department or urgent care.   If you have a non-emergency medical problem during routine business hours, please contact your provider's office and ask to speak with a nurse.   For questions related to your Healthy [facility] health plan, please call: [phone] or visit the homepage here: [email or web address]  If you would like to schedule transportation through your Healthy [facility] plan, please call the following number at least 2 days in advance of your appointment: [phone]  For information about your ride after you set it up, call Ride Assist at [phone]. Use this number to activate a Will Call pickup, or if your transportation is late for a scheduled pickup. Use this number, too, if you need to make a change or cancel a previously scheduled reservation.  If you need transportation services right away, call [phone]. The after-hours call center is staffed 24 hours to handle ride assistance and urgent reservation requests (including discharges) 365 days a year. Urgent trips include sick visits, hospital discharge requests and life-sustaining treatment.  Call the [facility] Line at ([phone], at any time, 24 hours a day, 7 days a week. If you are in danger or need immediate medical attention call 911.  If you would like help to quit smoking, call 1-800-QUIT-NOW ((413)398-0007) OR Espaol: 1-855-Djelo-Ya (4-742-595-6387) o para ms informacin haga clic aqu or Text READY to 564-332 to register via text      24- Hour Availability:    [facility]  [address] [place], [place] [phone] Crisis [phone]   [facility] [phone]  [place] [facility]  [phone]    [facility]  [phone] (after hours)   Therapeutic Alternative/Mobile Crisis   ([phone]   [place] National Suicide Hotline  804-286-8798 [place] Child[place]) [place] [address]   Call 254-561-5826 for mental health emergencies   [facility]  ([phone]);  [place] and [employer]  [phone]); [place], [place], [place], [place], [place], [place], [place]    [place] [facility] Urgent Care for [facility] Residents For 24/7 walk-up access to mental health services for [facility] children (4+), adolescents and adults, please visit the [facility] located at [address] in [place], [place].  *[place] also provides comprehensive outpatient behavioral health services in a variety of locations around the [place].  Connect With [place] [address] [place], [place] [address] HelpLine: 458-726-3104 or 1-564-670-3570  Get Directions  Find Help 24/7 By Phone Call our 24-hour HelpLine at 228-414-9491 or (204)360-6784 for immediate assistance for mental health and substance abuse issues.  Walk-In Help [place]: [facility] (Ages 4 and Up) [place]: Emergency Dept., [facility] Additional Resources National Hopeline Network: 1-800-SUICIDE The National Suicide Prevention Lifeline: 1-800-273-TALK     Dickie La, BSW, MSW, LCSW Managed Medicaid LCSW [facility]  [email or web address] Phone: [phone]

## 2023-08-19 NOTE — Telephone Encounter (Signed)
Form placed in providers folder for completion and signature.  °

## 2023-08-20 ENCOUNTER — Other Ambulatory Visit: Payer: Medicaid Other | Admitting: *Deleted

## 2023-08-20 ENCOUNTER — Other Ambulatory Visit: Payer: Medicaid Other

## 2023-08-20 DIAGNOSIS — N179 Acute kidney failure, unspecified: Secondary | ICD-10-CM

## 2023-08-20 NOTE — Patient Instructions (Signed)
Visit Information  [name] was given information about Medicaid Managed Care team care coordination services as a part of their Healthy [facility] Medicaid benefit. James Lambert verbally consented to engagement with the [facility] Managed Care team.   If you are experiencing a medical emergency, please call 911 or report to your local emergency department or urgent care.   If you have a non-emergency medical problem during routine business hours, please contact your provider's office and ask to speak with a nurse.   For questions related to your Healthy [facility] health plan, please call: [phone] or visit the homepage here: [email or web address]  If you would like to schedule transportation through your Healthy [facility] plan, please call the following number at least 2 days in advance of your appointment: [phone]  For information about your ride after you set it up, call Ride Assist at [phone]. Use this number to activate a Will Call pickup, or if your transportation is late for a scheduled pickup. Use this number, too, if you need to make a change or cancel a previously scheduled reservation.  If you need transportation services right away, call [phone]. The after-hours call center is staffed 24 hours to handle ride assistance and urgent reservation requests (including discharges) 365 days a year. Urgent trips include sick visits, hospital discharge requests and life-sustaining treatment.  Call the [facility] Line at [phone], at any time, 24 hours a day, 7 days a week. If you are in danger or need immediate medical attention call 911.  If you would like help to quit smoking, call 1-800-QUIT-NOW ((252)197-4578) OR Espaol: 1-855-Djelo-Ya (0-932-355-7322) o para ms informacin haga clic aqu or Text READY to 025-427 to register via text  James Lambert,   Please see education materials related to managing pain provided  as print materials.   The patient verbalized understanding of instructions, educational materials, and care plan provided today and agreed to receive a mailed copy of patient instructions, educational materials, and care plan.   Telephone follow up appointment with Managed Medicaid care management team member scheduled for:09/18/23 @ 11:15am  James Emms RN, BSN [place]  Value-Based Care [facility] RN Care Coordinator [phone]   Following is a copy of your plan of care:  Care Plan : RN Care Manager Plan of Care  Updates made by Heidi Dach, RN since 08/20/2023 12:00 AM     Problem: Health Management needs related to Knee Pain      Long-Range Goal: Development of Plan of Care to address Health Management needs related to Knee Pain   Start Date: 08/20/2023  Expected End Date: 11/18/2023  Note:   Current Barriers:  Chronic Disease Management support and education needs related to Knee Pain James Lambert reports bilat knee pain that limits his activity. He uses his brothers walker, cane and wheelchair on days when the pain is bad. He is requesting an aide. He has all of his medications. He would like information on medical transportation provided by [employer]. RNCM Clinical Goal(s):  Patient will verbalize understanding of plan for management of Knee Pain as evidenced by patient reports attend all scheduled medical appointments: schedule with Orthopedic and 11/11/23 with PCP as evidenced by provider documentation in EMR        continue to work with RN Care Manager and/or Social Worker to address care management and care coordination needs related to knee pain as evidenced by adherence to CM Team Scheduled appointments     through collaboration  with RN Care manager, provider, and care team.   Interventions: Evaluation of current treatment plan related to  self management and patient's adherence to plan as established by provider   Pain:  (Status: New goal.)  Long Term Goal  Pain assessment performed-both knees hurt "bad" Medications reviewed Reviewed provider established plan for pain management; Discussed importance of adherence to all scheduled medical appointments; Counseled on the importance of reporting any/all new or changed pain symptoms or management strategies to pain management provider; Advised patient to report to care team affect of pain on daily activities; Discussed use of relaxation techniques and/or diversional activities to assist with pain reduction (distraction, imagery, relaxation, massage, acupressure, TENS, heat, and cold application; Reviewed with patient prescribed pharmacological and nonpharmacological pain relief strategies; Assessed social determinant of health barriers;  Provided patient with information to Delcie Roch [phone], advised patient to call for appointment/referral placed on 08/12/23 Provided patient with medical transportation provided by [facility] [phone] Collaborated with PCP for PCS referral, BSW emailed HB form to PCP  Patient Goals/Self-Care Activities: Take medications as prescribed   Attend all scheduled provider appointments Call provider office for new concerns or questions  Follow up on Orthopedic referral, call [phone] to schedule an appointment

## 2023-08-20 NOTE — Patient Outreach (Signed)
Medicaid Managed Care   Nurse Care Manager Note  08/20/2023 Name:  James Lambert  James Lambert is an 57 y.o. year old male who is a primary patient of James Lambert.  The [facility] Managed Care Coordination team was consulted for assistance with:    Knee pain  James Lambert was given information about Medicaid Managed Care Coordination team services today. James Lambert Patient agreed to services and verbal consent obtained.  Engaged with patient by telephone for initial visit in response to provider referral for case management and/or care coordination services.   Assessments/Interventions:  Review of past medical history, allergies, medications, health status, including review of consultants reports, laboratory and other test data, was performed as part of comprehensive evaluation and provision of chronic care management services.  SDOH (Social Determinants of Health) assessments and interventions performed: SDOH Interventions    Flowsheet Row Patient Outreach Telephone from 08/20/2023 in [place] POPULATION HEALTH DEPARTMENT Patient Outreach Telephone from 08/18/2023 in [place] POPULATION HEALTH DEPARTMENT Patient Outreach from 03/09/2023 in [place] POPULATION HEALTH DEPARTMENT Patient Outreach Telephone from 02/25/2023 in [place] POPULATION HEALTH DEPARTMENT Office Visit from 09/10/2022 in [facility]  SDOH Interventions       Food Insecurity Interventions -- AMB Referral  [Food support resources mailed to pt] -- -- --  Housing Interventions Intervention Not Indicated -- -- -- --  Transportation Interventions Payor Benefit  [employer] medical transportation [employer] [phone] -- -- Intervention Not Indicated --  Alcohol Usage Interventions -- -- -- -- Engineer, maintenance (IT) Strain Interventions -- -- Other (Comment)  [Working with [facility]] -- --  Stress Interventions --  [employer], Provide Counseling  [Pt denies alcohol resources and BH resources] -- [employer], Provide Counseling --       Care Plan  No Known Allergies  Medications Reviewed Today     Reviewed by James Dach, RN (Registered Nurse) on 08/20/23 at 1435  Med List Status: <None>   Medication Order Taking? Sig Documenting Provider Last Dose Status Informant  acetaminophen (TYLENOL) 325 MG tablet [id number] Yes Take 2 tablets (650 mg total) by mouth every 6 (six) hours as needed for mild pain. James Dials Lambert, Lambert  Active Self  albuterol (VENTOLIN HFA) 108 (90 Base) MCG/ACT inhaler [id number] Yes Inhale 1-2 puffs into the lungs every 6 (six) hours as needed for wheezing or shortness of breath. [place], [place] Lambert, Lambert  Active Self  amLODipine (NORVASC) 10 MG tablet [id number] Yes Take 1 tablet (10 mg total) by mouth daily. James Dials Lambert, Lambert  Active   atorvastatin (LIPITOR) 80 MG tablet [id number] Yes Take 1 tablet (80 mg total) by mouth daily. James Dials Lambert, Lambert  Active   clopidogrel (PLAVIX) 75 MG tablet [id number] Yes Take 1 tablet (75 mg total) by mouth daily. James Dials Lambert, Lambert  Active   metoprolol succinate (TOPROL-XL) 50 MG 24 hr tablet [id number] Yes Take 1 tablet (50 mg total) by mouth daily with or immediately following a meal. Cannady, Jolene Lambert, Lambert  Active   Multiple Vitamin (MULTIVITAMIN WITH MINERALS) TABS tablet [id number] Yes Take 1 tablet by mouth daily. James Lambert  Active Self  olmesartan-hydrochlorothiazide (BENICAR HCT) 40-12.5 MG tablet [id number] Yes Take 1 tablet by mouth daily. James Dials Lambert, Lambert  Active   pantoprazole (PROTONIX) 40 MG tablet [id number] Yes Take 1 tablet (40 mg  total) by mouth at bedtime. James Dials Lambert, Lambert  Active   thiamine (VITAMIN B-1) 100 MG tablet [id number] Yes Take 1 tablet (100 mg total) by mouth daily. James Dials Lambert, Lambert  Active   umeclidinium-vilanterol (ANORO ELLIPTA)  62.5-25 MCG/ACT AEPB [id number] Yes Inhale 1 puff into the lungs daily at 6 (six) AM. James Skiff, Lambert  Active Self           Med Note James Lambert   Fri Apr 24, 2023  3:05 PM)              Patient Active Problem List   Diagnosis Date Noted   Complex tear of meniscus of right knee 04/25/2023   Hyponatremia 04/24/2023   Obesity (BMI 30-39.9) 04/24/2023   Elevated hemoglobin A1c measurement 09/11/2022   Hemiparesis affecting right side as late effect of stroke (HCC) 09/10/2022   Bilateral chronic knee pain 09/10/2022   Aortic atherosclerosis (HCC) 09/10/2022   GERD without esophagitis 08/30/2022   Mixed hyperlipidemia 08/30/2022   Alcohol abuse 08/30/2022   Seizure cerebral (HCC) 05/10/2022   Hypertension    Nicotine dependence, cigarettes, uncomplicated    Hydradenitis    COPD (chronic obstructive pulmonary disease) (HCC)    History of CVA (cerebrovascular accident) 03/08/2022    Conditions to be addressed/monitored per PCP order:   Knee Pain  Care Plan : RN Care Manager Plan of Care  Updates made by James Dach, RN since 08/20/2023 12:00 AM     Problem: Health Management needs related to Knee Pain      Long-Range Goal: Development of Plan of Care to address Health Management needs related to Knee Pain   Start Date: 08/20/2023  Expected End Date: 11/18/2023  Note:   Current Barriers:  Chronic Disease Management support and education needs related to Knee Pain James Lambert reports bilat knee pain that limits his activity. He uses his brothers walker, cane and wheelchair on days when the pain is bad. He is requesting an aide. He has all of his medications. He would like information on medical transportation provided by [employer]. RNCM Clinical Goal(s):  Patient will verbalize understanding of plan for management of Knee Pain as evidenced by patient reports attend all scheduled medical appointments: schedule with Orthopedic and 11/11/23 with PCP as evidenced  by provider documentation in EMR        continue to work with RN Care Manager and/or Social Worker to address care management and care coordination needs related to knee pain as evidenced by adherence to CM Team Scheduled appointments     through collaboration with Medical illustrator, provider, and care team.   Interventions: Evaluation of current treatment plan related to  self management and patient's adherence to plan as established by provider   Pain:  (Status: New goal.) Long Term Goal  Pain assessment performed-both knees hurt "bad" Medications reviewed Reviewed provider established plan for pain management; Discussed importance of adherence to all scheduled medical appointments; Counseled on the importance of reporting any/all new or changed pain symptoms or management strategies to pain management provider; Advised patient to report to care team affect of pain on daily activities; Discussed use of relaxation techniques and/or diversional activities to assist with pain reduction (distraction, imagery, relaxation, massage, acupressure, TENS, heat, and cold application; Reviewed with patient prescribed pharmacological and nonpharmacological pain relief strategies; Assessed social determinant of health barriers;  Provided patient with information to Delcie Roch [phone], advised patient to call for appointment/referral placed on 08/12/23  Provided patient with medical transportation provided by [facility] [phone] Collaborated with PCP for PCS referral, BSW emailed HB form to PCP  Patient Goals/Self-Care Activities: Take medications as prescribed   Attend all scheduled provider appointments Call provider office for new concerns or questions  Follow up on Orthopedic referral, call [phone] to schedule an appointment       Follow Up:  Patient agrees to Care Plan and Follow-up.  Plan: The Managed Medicaid care management team will reach out to the patient again over the  next 30 days.  Date/time of next scheduled RN care management/care coordination outreach:  09/18/23 @ 11:15pm  Estanislado Emms RN, BSN [place]  Value-Based Care [facility] RN Care Coordinator [phone]

## 2023-08-20 NOTE — Telephone Encounter (Signed)
Form completed and faxed back to [facility].

## 2023-08-21 ENCOUNTER — Other Ambulatory Visit: Payer: Self-pay

## 2023-08-21 DIAGNOSIS — Z1211 Encounter for screening for malignant neoplasm of colon: Secondary | ICD-10-CM

## 2023-08-21 LAB — BASIC METABOLIC PANEL
BUN/Creatinine Ratio: 13 (ref 9–20)
BUN: 17 mg/dL (ref 6–24)
CO2: 22 mmol/L (ref 20–29)
Calcium: 9.2 mg/dL (ref 8.7–10.2)
Chloride: 101 mmol/L (ref 96–106)
Creatinine, Ser: 1.34 mg/dL — ABNORMAL HIGH (ref 0.76–1.27)
Glucose: 90 mg/dL (ref 70–99)
Potassium: 4.5 mmol/L (ref 3.5–5.2)
Sodium: 140 mmol/L (ref 134–144)
eGFR: 62 mL/min/{1.73_m2} (ref >59)

## 2023-08-21 MED ORDER — NA SULFATE-K SULFATE-MG SULF 17.5-3.13-1.6 GM/177ML PO SOLN
1.0000 | Freq: Once | ORAL | 0 refills | Status: AC
Start: 2023-08-21 — End: 2023-08-21

## 2023-08-21 NOTE — Progress Notes (Signed)
Good morning, please let James Lambert know his labs have returned and overall kidney function much better. Continue to hydrate with water well to avoid getting an acute kidney injury and reduce alcohol use.  Any questions? Keep being amazing!!  Thank you for allowing me to participate in your care.  I appreciate you. Kindest regards, Wendelyn Kiesling

## 2023-08-21 NOTE — Telephone Encounter (Signed)
Blood thinner advice received from [facility]. Pt has been cleared to have procedure. Must hold Plavix 5 days prior and restart 1 day after if no bleeding issues after. Pt will be contacted to schedule.  Colonoscopy has been scheduled with Dr. Tobi Bastos 09/29/23 at [facility].

## 2023-08-21 NOTE — Addendum Note (Signed)
Addended by: Avie Arenas on: 08/21/2023 10:28 AM   Modules accepted: Orders

## 2023-08-25 ENCOUNTER — Telehealth: Payer: Self-pay | Admitting: Nurse Practitioner

## 2023-08-25 NOTE — Telephone Encounter (Addendum)
Drinda Butts with [facility] stated she faxed over a [phone] form to fax number [phone] on 08/20/2023 and Drinda Butts needs this form signed and faxed back to # [phone]. Drinda Butts will re-fax this today, 08/25/2023   [facility] with [facility] Care Phone # [phone]

## 2023-08-26 NOTE — Telephone Encounter (Signed)
Forms received and started. Placed forms in providers folder for completion and signature.

## 2023-08-27 NOTE — Telephone Encounter (Signed)
Form completed and signed by Jolene. Faxed completed form to [facility].

## 2023-09-03 ENCOUNTER — Telehealth: Payer: Self-pay | Admitting: Nurse Practitioner

## 2023-09-03 NOTE — Telephone Encounter (Signed)
Copied from CRM [id number] [id number]. Topic: General - Other >> Sep 03, 2023  1:28 PM Phill Myron wrote: ATTN: Pablo Ledger, CMA   on   08/27/23  2:32 PM you notated (Form completed and signed by Jolene. Faxed completed form to [facility].) Drinda Butts from [facility] called and stated she did not receive them, and ask can you please refax to [phone].. Thank you

## 2023-09-03 NOTE — Telephone Encounter (Signed)
Drinda Butts called from [facility] and said she did recv the 3150 form

## 2023-09-04 NOTE — Telephone Encounter (Signed)
James Lambert and she states that she has [name] form.

## 2023-09-18 ENCOUNTER — Other Ambulatory Visit: Payer: Medicaid Other | Admitting: *Deleted

## 2023-09-18 NOTE — Patient Outreach (Signed)
Medicaid Managed Care   Nurse Care Manager Note  09/18/2023 Name:  James Lambert MRN:  [id number] DOB:  11-08-1966  James Lambert is an 57 y.o. year old male who is a primary patient of Cannady, Dorie Rank, NP.  [facility] Managed Care Coordination team was consulted for assistance with:    Pain  James Lambert was given information about Medicaid Managed Care Coordination team services today. James Lambert Patient agreed to services and verbal consent obtained.  Engaged with patient by telephone for follow up visit in response to provider referral for case management and/or care coordination services.   Assessments/Interventions:  Review of past medical history, allergies, medications, health status, including review of consultants reports, laboratory and other test data, was performed as part of comprehensive evaluation and provision of chronic care management services.  SDOH (Social Determinants of Health) assessments and interventions performed: SDOH Interventions    Flowsheet Row Patient Outreach Telephone from 08/20/2023 in [place] POPULATION HEALTH DEPARTMENT Patient Outreach Telephone from 08/18/2023 in [place] POPULATION HEALTH DEPARTMENT Patient Outreach from 03/09/2023 in [place] POPULATION HEALTH DEPARTMENT Patient Outreach Telephone from 02/25/2023 in [place] POPULATION HEALTH DEPARTMENT Office Visit from 09/10/2022 in [facility]  SDOH Interventions       Food Insecurity Interventions -- AMB Referral  [Food support resources mailed to pt] -- -- --  Housing Interventions Intervention Not Indicated -- -- -- --  Transportation Interventions Payor Benefit  [employer] medical transportation [employer] [phone] -- -- Intervention Not Indicated --  Alcohol Usage Interventions -- -- -- -- Engineer, maintenance (IT) Strain Interventions -- -- Other (Comment)  [Working with [facility]] -- --  Stress Interventions --  [employer], Provide Counseling  [Pt denies alcohol resources and BH resources] -- [employer], Provide Counseling --       Care Plan  No Known Allergies  Medications Reviewed Today     Reviewed by Heidi Dach, RN (Registered Nurse) on 09/18/23 at 1136  Med List Status: <None>   Medication Order Taking? Sig Documenting Provider Last Dose Status Informant  acetaminophen (TYLENOL) 325 MG tablet [id number] Yes Take 2 tablets (650 mg total) by mouth every 6 (six) hours as needed for mild pain. Aura Dials T, NP Taking Active Self  albuterol (VENTOLIN HFA) 108 (90 Base) MCG/ACT inhaler [id number] Yes Inhale 1-2 puffs into the lungs every 6 (six) hours as needed for wheezing or shortness of breath. [place], Chioma E, NP Taking Active Self  amLODipine (NORVASC) 10 MG tablet [id number] Yes Take 1 tablet (10 mg total) by mouth daily. Aura Dials T, NP Taking Active   amoxicillin-clavulanate (AUGMENTIN) 875-125 MG tablet [id number] Yes Take 1 tablet by mouth 2 (two) times daily. [provider] Taking Active   atorvastatin (LIPITOR) 80 MG tablet [id number] Yes Take 1 tablet (80 mg total) by mouth daily. Aura Dials T, NP Taking Active   clopidogrel (PLAVIX) 75 MG tablet [id number] Yes Take 1 tablet (75 mg total) by mouth daily. Aura Dials T, NP Taking Active   doxycycline (VIBRA-TABS) 100 MG tablet [id number] Yes  [provider] Taking Active   HYDROcodone-acetaminophen (NORCO/VICODIN) 5-325 MG tablet [id number] Yes Take 1 tablet by mouth every 6 (six) hours as needed. [provider] Taking Active   metoprolol succinate (TOPROL-XL) 50 MG 24 hr tablet [id number] Yes Take 1 tablet (50 mg total) by mouth daily with or immediately following a meal. Cannady, Jolene  T, NP Taking Active   Multiple Vitamin (MULTIVITAMIN WITH MINERALS) TABS tablet [id number] Yes Take 1 tablet by mouth daily. Milinda Antis, PA-C Taking  Active Self  Na Sulfate-K Sulfate-Mg Sulf 17.5-3.13-1.6 GM/177ML Criss Rosales [id number] Yes  [provider] Taking Active   olmesartan-hydrochlorothiazide (BENICAR HCT) 40-12.5 MG tablet [id number] Yes Take 1 tablet by mouth daily. Aura Dials T, NP Taking Active   pantoprazole (PROTONIX) 40 MG tablet [id number] Yes Take 1 tablet (40 mg total) by mouth at bedtime. Aura Dials T, NP Taking Active   thiamine (VITAMIN B-1) 100 MG tablet [id number] Yes Take 1 tablet (100 mg total) by mouth daily. Aura Dials T, NP Taking Active   umeclidinium-vilanterol (ANORO ELLIPTA) 62.5-25 MCG/ACT AEPB [id number] Yes Inhale 1 puff into the lungs daily at 6 (six) AM. Marjie Skiff, NP Taking Active Self           Med Note Sharia Reeve   Fri Apr 24, 2023  3:05 PM)              Patient Active Problem List   Diagnosis Date Noted   Complex tear of meniscus of right knee 04/25/2023   Hyponatremia 04/24/2023   Obesity (BMI 30-39.9) 04/24/2023   Elevated hemoglobin A1c measurement 09/11/2022   Hemiparesis affecting right side as late effect of stroke (HCC) 09/10/2022   Bilateral chronic knee pain 09/10/2022   Aortic atherosclerosis (HCC) 09/10/2022   GERD without esophagitis 08/30/2022   Mixed hyperlipidemia 08/30/2022   Alcohol abuse 08/30/2022   Seizure cerebral (HCC) 05/10/2022   Hypertension    Nicotine dependence, cigarettes, uncomplicated    Hydradenitis    COPD (chronic obstructive pulmonary disease) (HCC)    History of CVA (cerebrovascular accident) 03/08/2022    Conditions to be addressed/monitored per PCP order:   Pain  Care Plan : RN Care Manager Plan of Care  Updates made by Heidi Dach, RN since 09/18/2023 12:00 AM     Problem: Health Management needs related to Knee Pain      Long-Range Goal: Development of Plan of Care to address Health Management needs related to Knee Pain   Start Date: 08/20/2023  Expected End Date: 11/18/2023  Note:   Current  Barriers:  Chronic Disease Management support and education needs related to Knee Pain James Lambert reports bilat knee pain improved after recent Ortho visit and bilat knee injections. PCS evaluation scheduled on 09/23/23.  RNCM Clinical Goal(s):  Patient will verbalize understanding of plan for management of Knee Pain as evidenced by patient reports attend all scheduled medical appointments: schedule with Orthopedic and 11/11/23 with PCP as evidenced by provider documentation in EMR        continue to work with RN Care Manager and/or Social Worker to address care management and care coordination needs related to knee pain as evidenced by adherence to CM Team Scheduled appointments     through collaboration with Medical illustrator, provider, and care team.   Interventions: Evaluation of current treatment plan related to  self management and patient's adherence to plan as established by provider   Pain:  (Status: Goal on Track (progressing): YES.) Long Term Goal  Pain assessment performed-both knees hurt "bad" Medications reviewed Reviewed provider established plan for pain management; Discussed importance of adherence to all scheduled medical appointments; Counseled on the importance of reporting any/all new or changed pain symptoms or management strategies to pain management provider; Advised patient to report to care team affect of pain on daily activities; Discussed  use of relaxation techniques and/or diversional activities to assist with pain reduction (distraction, imagery, relaxation, massage, acupressure, TENS, heat, and cold application; Reviewed with patient prescribed pharmacological and nonpharmacological pain relief strategies; Assessed social determinant of health barriers;  Discussed Ortho visit, patient reports bilat knee injections and scheduled in December for bilat knee injections-RNCM unable to see visit in CareEverywhere Discussed following up with [facility] on 09/23/23 for PCS  evaluation  Patient Goals/Self-Care Activities: Take medications as prescribed   Attend all scheduled provider appointments Call provider office for new concerns or questions  Follow up on Orthopedic referral, call [phone] to schedule an appointment       Follow Up:  Patient agrees to Care Plan and Follow-up.  Plan: The Managed Medicaid care management team will reach out to the patient again over the next 60 days.  Date/time of next scheduled RN care management/care coordination outreach:  11/18/23 at 9am  Estanislado Emms RN, BSN [place]  [facility] RN Care Coordinator [phone]

## 2023-09-18 NOTE — Patient Instructions (Signed)
Visit Information  Mr. Naval was given information about Medicaid Managed Care team care coordination services as a part of their Healthy [facility] Medicaid benefit. ANTON CHERAMIE verbally consented to engagement with the [facility] Managed Care team.   If you are experiencing a medical emergency, please call 911 or report to your local emergency department or urgent care.   If you have a non-emergency medical problem during routine business hours, please contact your provider's office and ask to speak with a nurse.   For questions related to your Healthy [facility] health plan, please call: [phone] or visit the homepage here: [email or web address]  If you would like to schedule transportation through your Healthy [facility] plan, please call the following number at least 2 days in advance of your appointment: [phone]  For information about your ride after you set it up, call Ride Assist at [phone]. Use this number to activate a Will Call pickup, or if your transportation is late for a scheduled pickup. Use this number, too, if you need to make a change or cancel a previously scheduled reservation.  If you need transportation services right away, call [phone]. The after-hours call center is staffed 24 hours to handle ride assistance and urgent reservation requests (including discharges) 365 days a year. Urgent trips include sick visits, hospital discharge requests and life-sustaining treatment.  Call the [facility] Line at [phone], at any time, 24 hours a day, 7 days a week. If you are in danger or need immediate medical attention call 911.  If you would like help to quit smoking, call 1-800-QUIT-NOW (365-495-4651) OR Espaol: 1-855-Djelo-Ya (4-742-595-6387) o para ms informacin haga clic aqu or Text READY to 564-332 to register via text  Mr. Gasner,   Please see education materials related to pain provided as print  materials.   The patient verbalized understanding of instructions, educational materials, and care plan provided today and agreed to receive a mailed copy of patient instructions, educational materials, and care plan.   Telephone follow up appointment with Managed Medicaid care management team member scheduled for:11/18/23 at 9am  Estanislado Emms RN, BSN [place]  Value-Based Care Institute [facility] RN Care Coordinator [phone]   Following is a copy of your plan of care:  Care Plan : RN Care Manager Plan of Care  Updates made by Heidi Dach, RN since 09/18/2023 12:00 AM     Problem: Health Management needs related to Knee Pain      Long-Range Goal: Development of Plan of Care to address Health Management needs related to Knee Pain   Start Date: 08/20/2023  Expected End Date: 11/18/2023  Note:   Current Barriers:  Chronic Disease Management support and education needs related to Knee Pain Mr. Dunleavy reports bilat knee pain improved after recent Ortho visit and bilat knee injections. PCS evaluation scheduled on 09/23/23.  RNCM Clinical Goal(s):  Patient will verbalize understanding of plan for management of Knee Pain as evidenced by patient reports attend all scheduled medical appointments: schedule with Orthopedic and 11/11/23 with PCP as evidenced by provider documentation in EMR        continue to work with RN Care Manager and/or Social Worker to address care management and care coordination needs related to knee pain as evidenced by adherence to CM Team Scheduled appointments     through collaboration with RN Care manager, provider, and care team.   Interventions: Evaluation of current treatment plan related to  self management and patient's adherence to plan as  established by provider   Pain:  (Status: Goal on Track (progressing): YES.) Long Term Goal  Pain assessment performed-both knees hurt "bad" Medications reviewed Reviewed provider established plan for  pain management; Discussed importance of adherence to all scheduled medical appointments; Counseled on the importance of reporting any/all new or changed pain symptoms or management strategies to pain management provider; Advised patient to report to care team affect of pain on daily activities; Discussed use of relaxation techniques and/or diversional activities to assist with pain reduction (distraction, imagery, relaxation, massage, acupressure, TENS, heat, and cold application; Reviewed with patient prescribed pharmacological and nonpharmacological pain relief strategies; Assessed social determinant of health barriers;  Discussed Ortho visit, patient reports bilat knee injections and scheduled in December for bilat knee injections-RNCM unable to see visit in CareEverywhere Discussed following up with [facility] on 09/23/23 for PCS evaluation  Patient Goals/Self-Care Activities: Take medications as prescribed   Attend all scheduled provider appointments Call provider office for new concerns or questions  Follow up on Orthopedic referral, call [phone] to schedule an appointment

## 2023-09-28 ENCOUNTER — Encounter: Payer: Self-pay | Admitting: Gastroenterology

## 2023-09-29 ENCOUNTER — Ambulatory Visit: Admission: RE | Admit: 2023-09-29 | Payer: Medicaid Other | Source: Home / Self Care | Admitting: Gastroenterology

## 2023-09-29 ENCOUNTER — Encounter: Admission: RE | Payer: Self-pay | Source: Home / Self Care

## 2023-09-29 SURGERY — COLONOSCOPY WITH PROPOFOL
Anesthesia: General

## 2023-10-12 ENCOUNTER — Ambulatory Visit: Payer: Self-pay

## 2023-10-12 NOTE — Telephone Encounter (Signed)
     Chief Complaint: 3 boils to buttocks, coin size, draining pus Symptoms: Above Frequency: This weekend Pertinent Negatives: Patient denies fever Disposition: [] ED /[] Urgent Care (no appt availability in office) / [x] Appointment(In office/virtual)/ []  [place] [facility]/ [] Home Care/ [] Refused Recommended Disposition /[] [place] [facility]/ []  Follow-up with PCP Additional Notes: Agrees with appointment. Will call back for worsening of symptoms.  Reason for Disposition  [1] Boil > 1/2 inch across (> 12 mm; larger than a marble) AND [2] center is soft or pus colored  Answer Assessment - Initial Assessment Questions 1. APPEARANCE of BOIL: "What does the boil look like?"      Unsure 2. LOCATION: "Where is the boil located?"      Buttocks 3. NUMBER: "How many boils are there?"      2-3 4. SIZE: "How big is the boil?" (e.g., inches, cm; compare to size of a coin or other object)     Coin 5. ONSET: "When did the boil start?"     This weekend 6. PAIN: "Is there any pain?" If Yes, ask: "How bad is the pain?"   (Scale 1-10; or mild, moderate, severe)     With sitting 7. FEVER: "Do you have a fever?" If Yes, ask: "What is it, how was it measured, and when did it start?"      No 8. SOURCE: "Have you been around anyone with boils or other Staph infections?" "Have you ever had boils before?"     No 9. OTHER SYMPTOMS: "Do you have any other symptoms?" (e.g., shaking chills, weakness, rash elsewhere on body)     Drain pus 10. PREGNANCY: "Is there any chance you are pregnant?" "When was your last menstrual period?"       N/a  Protocols used: Boil (Skin Abscess)-A-AH

## 2023-10-13 ENCOUNTER — Ambulatory Visit: Payer: Medicaid Other | Admitting: Nurse Practitioner

## 2023-10-13 ENCOUNTER — Encounter: Payer: Self-pay | Admitting: Nurse Practitioner

## 2023-10-13 VITALS — BP 118/76 | HR 92 | Temp 98.4°F | Ht 74.0 in | Wt 230.0 lb

## 2023-10-13 DIAGNOSIS — L732 Hidradenitis suppurativa: Secondary | ICD-10-CM | POA: Diagnosis not present

## 2023-10-13 MED ORDER — CHLORHEXIDINE GLUCONATE 4 % EX SOLN: Freq: Every day | CUTANEOUS | 4 refills | Status: DC | PRN

## 2023-10-13 MED ORDER — DOXYCYCLINE HYCLATE 100 MG PO TABS: 100.0000 mg | ORAL_TABLET | Freq: Two times a day (BID) | ORAL | 0 refills | Status: AC

## 2023-10-13 MED ORDER — MUPIROCIN 2 % EX OINT: 1.0000 | TOPICAL_OINTMENT | Freq: Two times a day (BID) | CUTANEOUS | 1 refills | Status: DC

## 2023-10-13 NOTE — Progress Notes (Signed)
BP 118/76   Pulse 92   Temp 98.4 F (36.9 C) (Oral)   Ht 6\' 2"  (1.88 m)   Wt 230 lb (104.3 kg)   SpO2 99%   BMI 29.53 kg/m    Subjective:    Patient ID: James Lambert, male    DOB: 12-Jan-1966, 57 y.o.   MRN: [id number]  HPI: James Lambert is a 57 y.o. male  Chief Complaint  Patient presents with   Boils    Patient states he has boils on his bottom, states that he is unsure of how many. States he thinks they have been draining some and have been there since this weekend. Patient requesting an antibiotic for treatment. States he has has this issue his whole life.    SKIN INFECTION Boils to bottom present, has had similar many times and seen general surgery in past. He believes about 2-3 are present since weekend.  Has been cleaning with peroxide. Duration: days Location: bottom History of trauma in area: no Pain: yes Quality: yes Severity: 7/10 Redness:  unknown Swelling: unknown Oozing: yes Pus: yes Fevers: no Nausea/vomiting:  some nausea Status: fluctuating Treatments attempted: Peroxide  Tetanus: UTD   Relevant past medical, surgical, family and social history reviewed and updated as indicated. Interim medical history since our last visit reviewed. Allergies and medications reviewed and updated.  Review of Systems  Constitutional:  Negative for activity change, diaphoresis, fatigue and fever.  Respiratory:  Negative for cough, chest tightness, shortness of breath and wheezing.   Cardiovascular:  Negative for chest pain, palpitations and leg swelling.  Gastrointestinal: Negative.   Skin:  Positive for wound.  Neurological: Negative.   Psychiatric/Behavioral: Negative.      Per HPI unless specifically indicated above     Objective:    BP 118/76   Pulse 92   Temp 98.4 F (36.9 C) (Oral)   Ht 6\' 2"  (1.88 m)   Wt 230 lb (104.3 kg)   SpO2 99%   BMI 29.53 kg/m   Wt Readings from Last 3 Encounters:  10/13/23 230 lb (104.3 kg)  08/12/23 237 lb 3.2 oz  (107.6 kg)  04/24/23 242 lb 8.1 oz (110 kg)    Physical Exam Vitals and nursing note reviewed.  Constitutional:      General: He is awake. He is not in acute distress.    Appearance: Normal appearance. He is well-developed and well-groomed. He is not ill-appearing or toxic-appearing.  HENT:     Head: Normocephalic.     Right Ear: Hearing and external ear normal.     Left Ear: Hearing and external ear normal.  Eyes:     General: Lids are normal.     Extraocular Movements: Extraocular movements intact.     Conjunctiva/sclera: Conjunctivae normal.  Neck:     Thyroid: No thyromegaly.     Vascular: No carotid bruit.  Cardiovascular:     Rate and Rhythm: Normal rate and regular rhythm.     Heart sounds: Normal heart sounds. No murmur heard.    No gallop.  Pulmonary:     Effort: No accessory muscle usage or respiratory distress.     Breath sounds: Normal breath sounds.  Abdominal:     General: Bowel sounds are normal. There is no distension.     Palpations: Abdomen is soft.     Tenderness: There is no abdominal tenderness.  Musculoskeletal:     Cervical back: Full passive range of motion without pain.  Right lower leg: No edema.     Left lower leg: No edema.  Lymphadenopathy:     Cervical: No cervical adenopathy.  Skin:    General: Skin is warm.     Capillary Refill: Capillary refill takes less than 2 seconds.     Findings: Abscess present.     Comments: Two abscesses to right gluteal fold present, both with mild erythema.  Minimal warmth.  Actively draining sero-sang with some purulent.  No tenderness.  Neurological:     Mental Status: He is alert and oriented to person, place, and time.     Deep Tendon Reflexes: Reflexes are normal and symmetric.     Reflex Scores:      Brachioradialis reflexes are 2+ on the right side and 2+ on the left side.      Patellar reflexes are 2+ on the right side and 2+ on the left side. Psychiatric:        Attention and Perception: Attention  normal.        Mood and Affect: Mood normal.        Speech: Speech normal.        Behavior: Behavior normal. Behavior is cooperative.        Thought Content: Thought content normal.     Results for orders placed or performed in visit on 08/20/23  Basic metabolic panel  Result Value Ref Range   Glucose 90 70 - 99 mg/dL   BUN 17 6 - 24 mg/dL   Creatinine, Ser 5.28 (H) 0.76 - 1.27 mg/dL   eGFR 62 >41 LK/GMW/1.02   BUN/Creatinine Ratio 13 9 - 20   Sodium 140 134 - 144 mmol/L   Potassium 4.5 3.5 - 5.2 mmol/L   Chloride 101 96 - 106 mmol/L   CO2 22 20 - 29 mmol/L   Calcium 9.2 8.7 - 10.2 mg/dL      Assessment & Plan:   Problem List Items Addressed This Visit       Musculoskeletal and Integument   Hydradenitis - Primary    Current flare of this to gluteal folds.  Will start Doxycycline 100 MG BID for 10 days + Mupirocin to area.  Educated him on these.  Sent in Hibiclens to clean area, educated on this.  Apply warm compresses to bottom 4 times a day for 20 minutes to encourage further draining.  May take Tylenol as needed for pain.  Return to office if worsening or ongoing.        Follow up plan: Return if symptoms worsen or fail to improve.

## 2023-10-13 NOTE — Assessment & Plan Note (Signed)
Current flare of this to gluteal folds.  Will start Doxycycline 100 MG BID for 10 days + Mupirocin to area.  Educated him on these.  Sent in Hibiclens to clean area, educated on this.  Apply warm compresses to bottom 4 times a day for 20 minutes to encourage further draining.  May take Tylenol as needed for pain.  Return to office if worsening or ongoing.

## 2023-10-13 NOTE — Patient Instructions (Signed)
Skin Abscess  A skin abscess is an infected spot of skin. It can have pus in it. An abscess can happen in any part of your body. Some abscesses break open (rupture) on their own. Most keep getting worse unless they are treated. If your abscess is not treated, the infection can spread deeper into your body and blood. This can make you feel sick. What are the causes? Germs that enter your skin. This may happen if you have: A cut or scrape. A wound from a needle or an insect bite. Blocked oil or sweat glands. A problem with the spot where your hair goes into your skin. A fluid-filled sac called a cyst under your skin. What increases the risk? Having problems with how your blood moves through your body. Having a weak body defense system (immune system). Having diabetes. Having dry and irritated skin. Needing to get shots often. Putting drugs into your body with a needle. Having a splinter or something else in your skin. Smoking. What are the signs or symptoms? A firm bump under your skin that hurts. A bump with pus at the top. Redness and swelling. Warm or tender spots. A sore on the skin. How is this treated? You may need to: Put a heat pack or a warm, wet washcloth on the spot. Have the pus drained. Take antibiotics. Follow these instructions at home: Medicines Take over-the-counter and prescription medicines only as told by your doctor. If you were prescribed antibiotics, take them as told by your doctor. Do not stop taking them even if you start to feel better. Abscess care  If you have an abscess that has not drained, put heat on it. Use the heat source that your doctor recommends, such as a moist heat pack or a heating pad. Place a towel between your skin and the heat source. Leave the heat on for 20-30 minutes. If your skin turns bright red, take off the heat right away to prevent burns. The risk of burns is higher if you cannot feel pain, heat, or cold. Follow  instructions from your doctor about how to take care of your abscess. Make sure you: Cover the abscess with a bandage. Wash your hands with soap and water for at least 20 seconds before and after you change your bandage. If you cannot use soap and water, use hand sanitizer. Change your bandage as told by your doctor. Check your abscess every day for signs that the infection is getting worse. Check for: More redness, swelling, or pain. More fluid or blood. Warmth. More pus or a worse smell. General instructions To keep the infection from spreading: Do not share personal items or towels. Do not go in a hot tub with others. Avoid making skin contact with others. Be careful when you get rid of used bandages or any pus from the abscess. Do not smoke or use any products that contain nicotine or tobacco. If you need help quitting, ask your doctor. Contact a doctor if: You see red streaks on your skin near the abscess. You have any signs of worse infection. You vomit every time you eat or drink. You have a fever, chills, or muscle aches. The cyst or abscess comes back. Get help right away if: You have very bad pain. You make less pee (urine) than normal. This information is not intended to replace advice given to you by your health care provider. Make sure you discuss any questions you have with your health care provider. Document Revised: 07/09/2022  Document Reviewed: 07/09/2022 Elsevier Patient Education  2024 [employer].

## 2023-11-07 NOTE — Patient Instructions (Incomplete)
Chronic Knee Pain, Adult Knee pain that lasts longer than 3 months is called chronic knee pain. You may have pain in one or both knees. Symptoms of chronic knee pain may also include swelling and stiffness. The most common cause is age-related wear and tear (osteoarthritis) of your knee joint. Many conditions can cause chronic knee pain. Treatment depends on the cause. The main treatments are physical therapy and weight loss. It may also be treated with medicines, injections, a knee sleeve or brace, and by using crutches. Rest, ice, pressure (compression), and elevation, also known as RICE therapy, may also be recommended. Follow these instructions at home: If you have a knee sleeve or brace:  Wear the knee sleeve or brace as told by your doctor. Take it off only as told by your doctor. Loosen it if your toes: Tingle. Become numb. Turn cold and blue. Keep it clean. If the sleeve or brace is not waterproof: Do not let it get wet. Ask your doctor if you may take it off when you take a bath or shower. If not, cover it with a watertight covering. Managing pain, stiffness, and swelling     If told, put heat on your knee. Do this as often as told by your doctor. Use the heat source that your doctor recommends, such as a moist heat pack or a heating pad. If you have a removable knee sleeve or brace, take it off as told by your doctor. Place a towel between your skin and the heat source. Leave the heat on for 20-30 minutes. Take off the heat if your skin turns bright red. This is very important. If you cannot feel pain, heat, or cold, you have a greater risk of getting burned. If told, put ice on your knee. To do this: If you have a removable knee sleeve or brace, take it off as told by your doctor. Put ice in a plastic bag. Place a towel between your skin and the bag. Leave the ice on for 20 minutes, 2-3 times a day. Take off the ice if your skin turns bright red. This is very important. If you  cannot feel pain, heat, or cold, you have a greater risk of damage to the area. Move your toes often. Raise the injured area above the level of your heart while you are sitting or lying down. Activity Avoid activities where both feet leave the ground at the same time (high-impact activities). Examples are running, jumping rope, and doing jumping jacks. Follow the exercise plan that your doctor makes for you. Your doctor may suggest that you: Avoid activities that make knee pain worse. You may need to change the exercises that you do, the sports that you participate in, or your job duties. Wear shoes with cushioned soles. Avoid sports that require running and sudden changes in direction. Do exercises or physical therapy. This is planned to match your needs and your abilities. Do exercises that increase your balance and strength, such as tai chi and yoga. Do not use your injured knee to support your body weight until your doctor says that you can. Use crutches as told by your doctor. Return to your normal activities when your doctor says that it is safe. General instructions Take over-the-counter and prescription medicines only as told by your doctor. If you are overweight, work with your doctor and a food expert (dietitian) to set goals to lose weight. Being overweight can make your knee hurt more. Do not smoke or use any  products that contain nicotine or tobacco. If you need help quitting, ask your doctor. Keep all follow-up visits. Contact a doctor if: You have knee pain that is not getting better or gets worse. You are not able to do your exercises due to knee pain. Get help right away if: Your knee swells and the swelling gets worse. You cannot move your knee. You have very bad knee pain. Summary Knee pain that lasts more than 3 months is called chronic knee pain. The main treatments for chronic knee pain are physical therapy and weight loss. You may also need to take medicines, wear a  knee sleeve or brace, use crutches, and put ice or heat on your knee. Lose weight if you are overweight. Work with your doctor and a food expert (dietitian) to help you set goals to lose weight. Being overweight can make your knee hurt more. Follow the exercise plan that your doctor makes for you. This information is not intended to replace advice given to you by your health care provider. Make sure you discuss any questions you have with your health care provider. Document Revised: 05/08/2020 Document Reviewed: 05/09/2020 Elsevier Patient Education  2024 [employer].

## 2023-11-11 ENCOUNTER — Ambulatory Visit: Payer: Medicaid Other | Admitting: Nurse Practitioner

## 2023-11-18 ENCOUNTER — Other Ambulatory Visit: Payer: Self-pay | Admitting: *Deleted

## 2023-11-18 NOTE — Patient Outreach (Signed)
  Medicaid Managed Care   Unsuccessful Attempt Note   11/18/2023 Name: James Lambert MRN: [id number] DOB: 1966/02/18  Referred by: Marjie Skiff, NP Reason for referral : High Risk Managed Medicaid (Unsuccessful RNCM follow up telephone outreach)   An unsuccessful telephone outreach was attempted today. The patient was referred to the case management team for assistance with care management and care coordination.    Follow Up Plan: A HIPAA compliant phone message was left for the patient providing contact information and requesting a return call. and The Managed Medicaid care management team will reach out to the patient again over the next 7 days.    Estanislado Emms RN, BSN [place]  [facility] RN Care Coordinator [phone]

## 2023-11-18 NOTE — Patient Instructions (Signed)
Visit Information  Mr. CLINE GRAVATT  - as a part of your Medicaid benefit, you are eligible for care management and care coordination services at no cost or copay. I was unable to reach you by phone today but would be happy to help you with your health related needs. Please feel free to call me @ [phone].   A member of the Managed Medicaid care management team will reach out to you again over the next 7 days.   Estanislado Emms RN, BSN [place]  [facility] RN Care Coordinator [phone]

## 2023-11-20 ENCOUNTER — Telehealth: Payer: Self-pay

## 2023-11-20 NOTE — Telephone Encounter (Signed)
..   Medicaid Managed Care   Unsuccessful Outreach Note  11/20/2023 Name: James Lambert MRN: [id number] DOB: 1966/10/09  Referred by: Marjie Skiff, NP Reason for referral : Appointment (I called the patient today to get his missed phone appt with the MM RNCM rescheduled. I left a message on his VM.)   A second unsuccessful telephone outreach was attempted today. The patient was referred to the case management team for assistance with care management and care coordination.   Follow Up Plan: A HIPAA compliant phone message was left for the patient providing contact information and requesting a return call.  The care management team will reach out to the patient again over the next 7 days.   Weston Settle Care Guide  [facility] Managed  Care Guide [facility]  [phone]

## 2023-12-07 ENCOUNTER — Telehealth: Payer: Self-pay

## 2023-12-07 ENCOUNTER — Other Ambulatory Visit: Payer: Self-pay | Admitting: *Deleted

## 2023-12-07 NOTE — Progress Notes (Signed)
..   Medicaid Managed Care   Unsuccessful Outreach Note  12/07/2023 Name: James Lambert MRN: [id number] DOB: 1966/04/26  Referred by: Marjie Skiff, NP Reason for referral : High Risk Managed Medicaid (I called the patient today to reschedule his missed phone visit with the MM RNCM. I had to leave my name and number on his VM.)   Third unsuccessful telephone outreach was attempted today. The patient was referred to the case management team for assistance with care management and care coordination. The patient's primary care provider has been notified of our unsuccessful attempts to make or maintain contact with the patient. The care management team is pleased to engage with this patient at any time in the future should he/she be interested in assistance from the care management team.   Follow Up Plan: We have been unable to make contact with the patient for follow up. The care management team is available to follow up with the patient after provider conversation with the patient regarding recommendation for care management engagement and subsequent re-referral to the care management team.   Weston Settle Care Guide  [facility] Managed  Care Guide [facility]  [phone]

## 2023-12-07 NOTE — Patient Outreach (Signed)
   Care Management/Care Coordination  RN Case Manager Case Closure Note  12/07/2023 Name: James Lambert MRN: [id number] DOB: [birth year]  James Lambert is a 57 y.o. year old male who is a primary care patient of Cannady, Dorie Rank, NP. The care management/care coordination team was consulted for assistance with chronic disease management and/or care coordination needs.   Care Plan : RN Care Manager Plan of Care  Updates made by Heidi Dach, RN since 12/07/2023 12:00 AM  Completed 12/07/2023   Problem: Health Management needs related to Knee Pain Resolved 12/07/2023     Long-Range Goal: Development of Plan of Care to address Health Management needs related to Knee Pain Completed 12/07/2023  Start Date: 08/20/2023  Expected End Date: 11/18/2023  Note:   Current Barriers:  Chronic Disease Management support and education needs related to Knee Pain Unable to maintain contact with patient  RNCM Clinical Goal(s):  Patient will verbalize understanding of plan for management of Knee Pain as evidenced by patient reports attend all scheduled medical appointments: schedule with Orthopedic and 11/11/23 with PCP as evidenced by provider documentation in EMR        continue to work with RN Care Manager and/or Social Worker to address care management and care coordination needs related to knee pain as evidenced by adherence to CM Team Scheduled appointments     through collaboration with Medical illustrator, provider, and care team.   Interventions: Evaluation of current treatment plan related to  self management and patient's adherence to plan as established by provider   Pain:  (Status: Goal Not Met.) Long Term Goal  Pain assessment performed-both knees hurt "bad" Medications reviewed Reviewed provider established plan for pain management; Discussed importance of adherence to all scheduled medical appointments; Counseled on the importance of reporting any/all new or changed pain symptoms or  management strategies to pain management provider; Advised patient to report to care team affect of pain on daily activities; Discussed use of relaxation techniques and/or diversional activities to assist with pain reduction (distraction, imagery, relaxation, massage, acupressure, TENS, heat, and cold application; Reviewed with patient prescribed pharmacological and nonpharmacological pain relief strategies; Assessed social determinant of health barriers;  Discussed Ortho visit, patient reports bilat knee injections and scheduled in December for bilat knee injections-RNCM unable to see visit in CareEverywhere Discussed following up with [facility] on 09/23/23 for PCS evaluation  Patient Goals/Self-Care Activities: Take medications as prescribed   Attend all scheduled provider appointments Call provider office for new concerns or questions  Follow up on Orthopedic referral, call [phone] to schedule an appointment       Plan: We have been unable to make contact with the patient for follow up. The care management team is available to follow up with the patient should new care management/care coordination needs arise.   Estanislado Emms RN, BSN [place]  [facility] Health RN Care Coordinator [phone]

## 2024-01-03 NOTE — Patient Instructions (Signed)

## 2024-01-06 ENCOUNTER — Ambulatory Visit: Payer: Medicaid Other | Admitting: Nurse Practitioner

## 2024-01-06 ENCOUNTER — Encounter: Payer: Self-pay | Admitting: Nurse Practitioner

## 2024-01-06 VITALS — BP 152/92 | HR 61 | Temp 98.4°F | Ht 74.0 in | Wt 245.8 lb

## 2024-01-06 DIAGNOSIS — I69351 Hemiplegia and hemiparesis following cerebral infarction affecting right dominant side: Secondary | ICD-10-CM

## 2024-01-06 DIAGNOSIS — G40909 Epilepsy, unspecified, not intractable, without status epilepticus: Secondary | ICD-10-CM

## 2024-01-06 DIAGNOSIS — J439 Emphysema, unspecified: Secondary | ICD-10-CM | POA: Diagnosis not present

## 2024-01-06 DIAGNOSIS — I10 Essential (primary) hypertension: Secondary | ICD-10-CM | POA: Diagnosis not present

## 2024-01-06 DIAGNOSIS — F1010 Alcohol abuse, uncomplicated: Secondary | ICD-10-CM

## 2024-01-06 DIAGNOSIS — E782 Mixed hyperlipidemia: Secondary | ICD-10-CM

## 2024-01-06 DIAGNOSIS — R7309 Other abnormal glucose: Secondary | ICD-10-CM

## 2024-01-06 DIAGNOSIS — Z8673 Personal history of transient ischemic attack (TIA), and cerebral infarction without residual deficits: Secondary | ICD-10-CM

## 2024-01-06 DIAGNOSIS — E669 Obesity, unspecified: Secondary | ICD-10-CM

## 2024-01-06 DIAGNOSIS — L732 Hidradenitis suppurativa: Secondary | ICD-10-CM

## 2024-01-06 DIAGNOSIS — I700 Atherosclerosis of aorta: Secondary | ICD-10-CM | POA: Diagnosis not present

## 2024-01-06 DIAGNOSIS — F17210 Nicotine dependence, cigarettes, uncomplicated: Secondary | ICD-10-CM

## 2024-01-06 LAB — MICROALBUMIN, URINE WAIVED
Creatinine, Urine Waived: 300 mg/dL (ref 10–300)
Microalb, Ur Waived: 30 mg/L — ABNORMAL HIGH (ref 0–19)
Microalb/Creat Ratio: <30 mg/g (ref <30)

## 2024-01-06 LAB — BAYER DCA HB A1C WAIVED: HB A1C (BAYER DCA - WAIVED): 5.2 % (ref 4.8–5.6)

## 2024-01-06 MED ORDER — MUPIROCIN 2 % EX OINT: 1.0000 | TOPICAL_OINTMENT | Freq: Two times a day (BID) | CUTANEOUS | 1 refills | Status: DC

## 2024-01-06 MED ORDER — ATORVASTATIN CALCIUM 80 MG PO TABS: 80.0000 mg | ORAL_TABLET | Freq: Every day | ORAL | 4 refills | Status: DC

## 2024-01-06 MED ORDER — METOPROLOL SUCCINATE ER 50 MG PO TB24: 50.0000 mg | ORAL_TABLET | Freq: Every day | ORAL | 4 refills | Status: DC

## 2024-01-06 MED ORDER — OLMESARTAN MEDOXOMIL-HCTZ 40-12.5 MG PO TABS: 1.0000 | ORAL_TABLET | Freq: Every day | ORAL | 4 refills | Status: DC

## 2024-01-06 MED ORDER — AMLODIPINE BESYLATE 10 MG PO TABS: 10.0000 mg | ORAL_TABLET | Freq: Every day | ORAL | 4 refills | Status: DC

## 2024-01-06 MED ORDER — DOXYCYCLINE HYCLATE 100 MG PO TABS: 100.0000 mg | ORAL_TABLET | Freq: Two times a day (BID) | ORAL | 0 refills | Status: AC

## 2024-01-06 MED ORDER — CHLORHEXIDINE GLUCONATE 4 % EX SOLN: Freq: Every day | CUTANEOUS | 4 refills | Status: DC | PRN

## 2024-01-06 MED ORDER — UMECLIDINIUM-VILANTEROL 62.5-25 MCG/ACT IN AEPB: 1.0000 | INHALATION_SPRAY | Freq: Every day | RESPIRATORY_TRACT | 4 refills | Status: DC

## 2024-01-06 MED ORDER — CLOPIDOGREL BISULFATE 75 MG PO TABS: 75.0000 mg | ORAL_TABLET | Freq: Every day | ORAL | 4 refills | Status: DC

## 2024-01-06 NOTE — Assessment & Plan Note (Signed)
Three CVA in 2023.  Continue to collaborate with neurology.  Has family history of CVA, brother.  Recommend continue all current medications and discussed neuro goals: A1c <6.5%, LDL <55, and BP <130/80.  High risk for recurrent CVA, monitor closely and recommend complete cessation of smoking and alcohol use.

## 2024-01-06 NOTE — Assessment & Plan Note (Signed)
Noted on imaging 06/08/22.  Recommend complete cessation of smoking + continue daily Plavix and statin for prevention.

## 2024-01-06 NOTE — Assessment & Plan Note (Signed)
Chronic, ongoing.  Has had benefit with Anoro daily and has not had to use Albuterol.  Continue this regimen.  Recommend complete cessation of smoking. Lung screening ordered but has not attended.

## 2024-01-06 NOTE — Assessment & Plan Note (Signed)
I have recommended complete cessation of tobacco use. I have discussed various options available for assistance with tobacco cessation including over the counter methods (Nicotine gum, patch and lozenges). We also discussed prescription options (Chantix, Nicotine Inhaler / Nasal Spray). The patient is not interested in pursuing any prescription tobacco cessation options at this time.  Referral for lung screening placed last visit, has not attended.

## 2024-01-06 NOTE — Assessment & Plan Note (Signed)
BMI 31.56.  Recommended eating smaller high protein, low fat meals more frequently and exercising 30 mins a day 5 times a week with a goal of 10-15lb weight loss in the next 3 months. Patient voiced their understanding and motivation to adhere to these recommendations.

## 2024-01-06 NOTE — Progress Notes (Signed)
BP (!) 152/92 (BP Location: Left Arm, Patient Position: Sitting)   Pulse 61   Temp 98.4 F (36.9 C) (Oral)   Ht 6\' 2"  (1.88 m)   Wt 245 lb 12.8 oz (111.5 kg)   SpO2 98%   BMI 31.56 kg/m    Subjective:    Patient ID: James Lambert, male    DOB: 1966-08-12, 58 y.o.   MRN: [id number]  HPI: James Lambert is a 58 y.o. male  Chief Complaint  Patient presents with   Hyperlipidemia   Hypertension   Seizures   Has return of boils to bottom, needs treatment. These can be recurrent in nature.  HYPERTENSION / HYPERLIPIDEMIA Has not taken medication this morning.  Taking Amlodipine, Benicar, Metoprolol, Plavix, Atorvastatin.  Continues to drink alcohol daily.  1-2 beers a night and a couple shots of liquor.  Satisfied with current treatment? yes Duration of hypertension: chronic BP monitoring frequency: not checking BP range:  BP medication side effects: no Past BP meds:  Duration of hyperlipidemia: chronic Cholesterol medication side effects: no Cholesterol supplements: none Medication compliance: fair compliance misses one dose a week Aspirin: no Recent stressors: no Recurrent headaches: no Visual changes: no Palpitations: no Dyspnea: no Chest pain: no Lower extremity edema: no Dizzy/lightheaded: no   COPD Uses Anoro daily and Albuterol as needed.  Continues to smoke 4-5 cigarettes a day.  Has smoked for about 20 years. COPD status: stable Satisfied with current treatment?: yes Oxygen use: no Dyspnea frequency: none Cough frequency: none Rescue inhaler frequency:  never needs to use Limitation of activity: no Productive cough: none Last Spirometry: unknown Pneumovax: will get next visit Influenza: Up to Date   GERD Continues on Omeprazole as needed. 2-3 times a week. GERD control status: stable Satisfied with current treatment? yes Heartburn frequency: as above Medication side effects: no  Medication compliance: stable Dysphagia: no Odynophagia:   no Hematemesis: no Blood in stool: no EGD: no   Impaired Fasting Glucose HbA1C:  Lab Results  Component Value Date   HGBA1C 5.5 08/12/2023  Duration of elevated blood sugar:  Polydipsia: no Polyuria: no Weight change: no Visual disturbance: no Glucose Monitoring: no    Accucheck frequency: Not Checking    Fasting glucose:     Post prandial:  Diabetic Education: Not Completed Family history of diabetes: yes   Relevant past medical, surgical, family and social history reviewed and updated as indicated. Interim medical history since our last visit reviewed. Allergies and medications reviewed and updated.  Review of Systems  Constitutional:  Negative for activity change, diaphoresis, fatigue and fever.  Respiratory:  Negative for cough, chest tightness, shortness of breath and wheezing.   Cardiovascular:  Negative for chest pain, palpitations and leg swelling.  Gastrointestinal: Negative.   Endocrine: Negative for polydipsia, polyphagia and polyuria.  Skin:  Positive for wound.  Neurological: Negative.   Psychiatric/Behavioral: Negative.     Per HPI unless specifically indicated above     Objective:    BP (!) 152/92 (BP Location: Left Arm, Patient Position: Sitting)   Pulse 61   Temp 98.4 F (36.9 C) (Oral)   Ht 6\' 2"  (1.88 m)   Wt 245 lb 12.8 oz (111.5 kg)   SpO2 98%   BMI 31.56 kg/m   Wt Readings from Last 3 Encounters:  01/06/24 245 lb 12.8 oz (111.5 kg)  10/13/23 230 lb (104.3 kg)  08/12/23 237 lb 3.2 oz (107.6 kg)    Physical Exam Vitals and nursing note  reviewed.  Constitutional:      General: He is awake. He is not in acute distress.    Appearance: Normal appearance. He is well-developed and well-groomed. He is not ill-appearing or toxic-appearing.  HENT:     Head: Normocephalic.     Right Ear: Hearing and external ear normal.     Left Ear: Hearing and external ear normal.  Eyes:     General: Lids are normal.     Extraocular Movements: Extraocular  movements intact.     Conjunctiva/sclera: Conjunctivae normal.  Neck:     Thyroid: No thyromegaly.     Vascular: No carotid bruit.  Cardiovascular:     Rate and Rhythm: Normal rate and regular rhythm.     Heart sounds: Normal heart sounds. No murmur heard.    No gallop.  Pulmonary:     Effort: No accessory muscle usage or respiratory distress.     Breath sounds: Normal breath sounds.  Abdominal:     General: Bowel sounds are normal. There is no distension.     Palpations: Abdomen is soft.     Tenderness: There is no abdominal tenderness.  Musculoskeletal:     Cervical back: Full passive range of motion without pain.     Right lower leg: No edema.     Left lower leg: No edema.  Lymphadenopathy:     Cervical: No cervical adenopathy.  Skin:    General: Skin is warm.     Capillary Refill: Capillary refill takes less than 2 seconds.     Findings: Abscess present.     Comments: Two abscesses to right gluteal fold present, both with mild erythema.  Minimal warmth.  Actively draining sero-sang with some purulent.  No tenderness.  Neurological:     Mental Status: He is alert and oriented to person, place, and time.     Deep Tendon Reflexes: Reflexes are normal and symmetric.     Reflex Scores:      Brachioradialis reflexes are 2+ on the right side and 2+ on the left side.      Patellar reflexes are 2+ on the right side and 2+ on the left side. Psychiatric:        Attention and Perception: Attention normal.        Mood and Affect: Mood normal.        Speech: Speech normal.        Behavior: Behavior normal. Behavior is cooperative.        Thought Content: Thought content normal.    Results for orders placed or performed in visit on 08/20/23  Basic metabolic panel   Collection Time: 08/20/23  9:41 AM  Result Value Ref Range   Glucose 90 70 - 99 mg/dL   BUN 17 6 - 24 mg/dL   Creatinine, Ser 4.09 (H) 0.76 - 1.27 mg/dL   eGFR 62 >81 XB/JYN/8.29   BUN/Creatinine Ratio 13 9 - 20    Sodium 140 134 - 144 mmol/L   Potassium 4.5 3.5 - 5.2 mmol/L   Chloride 101 96 - 106 mmol/L   CO2 22 20 - 29 mmol/L   Calcium 9.2 8.7 - 10.2 mg/dL      Assessment & Plan:   Problem List Items Addressed This Visit       Cardiovascular and Mediastinum   Aortic atherosclerosis (HCC)   Noted on imaging 06/08/22.  Recommend complete cessation of smoking + continue daily Plavix and statin for prevention.      Relevant Medications  amLODipine (NORVASC) 10 MG tablet   atorvastatin (LIPITOR) 80 MG tablet   metoprolol succinate (TOPROL-XL) 50 MG 24 hr tablet   olmesartan-hydrochlorothiazide (BENICAR HCT) 40-12.5 MG tablet   Hypertension   Chronic and improved today.  BP elevated today, has not taken medication yet this morning.  Recommend he monitor BP at least a few mornings a week at home and document.  DASH diet at home.  Continue current medication regimen and adjust as needed.  Labs today: CBC, TSH, CMP.          Relevant Medications   amLODipine (NORVASC) 10 MG tablet   atorvastatin (LIPITOR) 80 MG tablet   metoprolol succinate (TOPROL-XL) 50 MG 24 hr tablet   olmesartan-hydrochlorothiazide (BENICAR HCT) 40-12.5 MG tablet   Other Relevant Orders   Microalbumin, Urine Waived   Comprehensive metabolic panel   CBC with Differential/Platelet   TSH     Respiratory   COPD (chronic obstructive pulmonary disease) (HCC) - Primary   Chronic, ongoing.  Has had benefit with Anoro daily and has not had to use Albuterol.  Continue this regimen.  Recommend complete cessation of smoking. Lung screening ordered but has not attended.      Relevant Medications   umeclidinium-vilanterol (ANORO ELLIPTA) 62.5-25 MCG/ACT AEPB     Nervous and Auditory   Hemiparesis affecting right side as late effect of stroke (HCC)   Ongoing, mild to right side post CVA.  Improving, strength returning and mobility improved.        Musculoskeletal and Integument   Hydradenitis   Current flare of this to  gluteal folds.  Will start Doxycycline 100 MG BID for 10 days + Mupirocin to area.  Educated him on these.  Sent in Hibiclens to clean area, educated on this.  Apply warm compresses to bottom 4 times a day for 20 minutes to encourage further draining.  May take Tylenol as needed for pain.  Return to office if worsening or ongoing.        Other   Alcohol abuse   Chronic, ongoing issue.  He has cut back, however recommend complete cessation of alcohol use to help prevent elevation in BP and stroke.        Relevant Orders   Magnesium   Elevated hemoglobin A1c measurement   A1c trend down today to 5.2%, praised for this.  Continue diet and exercise focus.      Relevant Orders   Bayer DCA Hb A1c Waived   Microalbumin, Urine Waived   History of CVA (cerebrovascular accident)   Three CVA in 2023.  Continue to collaborate with neurology.  Has family history of CVA, brother.  Recommend continue all current medications and discussed neuro goals: A1c <6.5%, LDL <55, and BP <130/80.  High risk for recurrent CVA, monitor closely and recommend complete cessation of smoking and alcohol use.        Mixed hyperlipidemia   Chronic, ongoing.  Continue current medication regimen and adjust as needed.  Lipid panel today.       Relevant Medications   amLODipine (NORVASC) 10 MG tablet   atorvastatin (LIPITOR) 80 MG tablet   metoprolol succinate (TOPROL-XL) 50 MG 24 hr tablet   olmesartan-hydrochlorothiazide (BENICAR HCT) 40-12.5 MG tablet   Other Relevant Orders   Comprehensive metabolic panel   Lipid Panel w/o Chol/HDL Ratio   Nicotine dependence, cigarettes, uncomplicated   I have recommended complete cessation of tobacco use. I have discussed various options available for assistance with tobacco cessation including over  the counter methods (Nicotine gum, patch and lozenges). We also discussed prescription options (Chantix, Nicotine Inhaler / Nasal Spray). The patient is not interested in pursuing any  prescription tobacco cessation options at this time.  Referral for lung screening placed last visit, has not attended.      Obesity (BMI 30-39.9)   BMI 31.56. Recommended eating smaller high protein, low fat meals more frequently and exercising 30 mins a day 5 times a week with a goal of 10-15lb weight loss in the next 3 months. Patient voiced their understanding and motivation to adhere to these recommendations.         Follow up plan: Return in about 6 months (around 07/05/2024) for HTN/HLD, IFG, SEIZURE.

## 2024-01-06 NOTE — Assessment & Plan Note (Signed)
Current flare of this to gluteal folds.  Will start Doxycycline 100 MG BID for 10 days + Mupirocin to area.  Educated him on these.  Sent in Hibiclens to clean area, educated on this.  Apply warm compresses to bottom 4 times a day for 20 minutes to encourage further draining.  May take Tylenol as needed for pain.  Return to office if worsening or ongoing.

## 2024-01-06 NOTE — Assessment & Plan Note (Signed)
A1c trend down today to 5.2%, praised for this.  Continue diet and exercise focus.

## 2024-01-06 NOTE — Assessment & Plan Note (Signed)
Chronic and improved today.  BP elevated today, has not taken medication yet this morning.  Recommend he monitor BP at least a few mornings a week at home and document.  DASH diet at home.  Continue current medication regimen and adjust as needed.  Labs today: CBC, TSH, CMP.

## 2024-01-06 NOTE — Assessment & Plan Note (Signed)
Chronic, ongoing issue.  He has cut back, however recommend complete cessation of alcohol use to help prevent elevation in BP and stroke.

## 2024-01-06 NOTE — Assessment & Plan Note (Signed)
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  Improving, strength returning and mobility improved.

## 2024-01-06 NOTE — Assessment & Plan Note (Signed)
Chronic, ongoing.  Continue current medication regimen and adjust as needed. Lipid panel today.

## 2024-01-07 LAB — COMPREHENSIVE METABOLIC PANEL
ALT: 9 [IU]/L (ref 0–44)
AST: 6 [IU]/L (ref 0–40)
Albumin: 3.8 g/dL (ref 3.8–4.9)
Alkaline Phosphatase: 78 [IU]/L (ref 44–121)
BUN/Creatinine Ratio: 10 (ref 9–20)
BUN: 12 mg/dL (ref 6–24)
Bilirubin Total: 0.3 mg/dL (ref 0.0–1.2)
CO2: 23 mmol/L (ref 20–29)
Calcium: 8.8 mg/dL (ref 8.7–10.2)
Chloride: 99 mmol/L (ref 96–106)
Creatinine, Ser: 1.17 mg/dL (ref 0.76–1.27)
Globulin, Total: 3.4 g/dL (ref 1.5–4.5)
Glucose: 94 mg/dL (ref 70–99)
Potassium: 4.2 mmol/L (ref 3.5–5.2)
Sodium: 137 mmol/L (ref 134–144)
Total Protein: 7.2 g/dL (ref 6.0–8.5)
eGFR: 73 mL/min/{1.73_m2} (ref >59)

## 2024-01-07 LAB — LIPID PANEL W/O CHOL/HDL RATIO
Cholesterol, Total: 128 mg/dL (ref 100–199)
HDL: 41 mg/dL (ref >39)
LDL Chol Calc (NIH): 73 mg/dL (ref 0–99)
Triglycerides: 68 mg/dL (ref 0–149)
VLDL Cholesterol Cal: 14 mg/dL (ref 5–40)

## 2024-01-07 LAB — CBC WITH DIFFERENTIAL/PLATELET
Basophils Absolute: 0 10*3/uL (ref 0.0–0.2)
Basos: 0 %
EOS (ABSOLUTE): 0.1 10*3/uL (ref 0.0–0.4)
Eos: 3 %
Hematocrit: 38.3 % (ref 37.5–51.0)
Hemoglobin: 12.8 g/dL — ABNORMAL LOW (ref 13.0–17.7)
Immature Grans (Abs): 0.1 10*3/uL (ref 0.0–0.1)
Immature Granulocytes: 1 %
Lymphocytes Absolute: 2 10*3/uL (ref 0.7–3.1)
Lymphs: 36 %
MCH: 31 pg (ref 26.6–33.0)
MCHC: 33.4 g/dL (ref 31.5–35.7)
MCV: 93 fL (ref 79–97)
Monocytes Absolute: 0.6 10*3/uL (ref 0.1–0.9)
Monocytes: 10 %
Neutrophils Absolute: 2.9 10*3/uL (ref 1.4–7.0)
Neutrophils: 50 %
Platelets: 338 10*3/uL (ref 150–450)
RBC: 4.13 x10E6/uL — ABNORMAL LOW (ref 4.14–5.80)
RDW: 13.3 % (ref 11.6–15.4)
WBC: 5.6 10*3/uL (ref 3.4–10.8)

## 2024-01-07 LAB — TSH: TSH: 0.892 u[IU]/mL (ref 0.450–4.500)

## 2024-01-07 LAB — MAGNESIUM: Magnesium: 1.9 mg/dL (ref 1.6–2.3)

## 2024-01-07 NOTE — Progress Notes (Signed)
Good morning, please let Lawrnce know labs have returned and overall these are stable. Continues to have a low hemoglobin, but this is trending up - improving.  Ensure to get lots of deep leafy greens in diet to help improve. Any questions? Keep being amazing!!  Thank you for allowing me to participate in your care.  I appreciate you. Kindest regards, Florence Yeung

## 2024-03-15 ENCOUNTER — Encounter: Payer: Self-pay | Admitting: Nurse Practitioner

## 2024-03-15 ENCOUNTER — Ambulatory Visit: Admitting: Nurse Practitioner

## 2024-03-15 VITALS — BP 130/86 | HR 97 | Temp 98.6°F | Resp 15

## 2024-03-15 DIAGNOSIS — L732 Hidradenitis suppurativa: Secondary | ICD-10-CM

## 2024-03-15 MED ORDER — MUPIROCIN 2 % EX OINT: 1.0000 | TOPICAL_OINTMENT | Freq: Two times a day (BID) | CUTANEOUS | 1 refills | Status: DC

## 2024-03-15 MED ORDER — CHLORHEXIDINE GLUCONATE 4 % EX SOLN: Freq: Every day | CUTANEOUS | 4 refills | Status: DC | PRN

## 2024-03-15 MED ORDER — SULFAMETHOXAZOLE-TRIMETHOPRIM 800-160 MG PO TABS
1.0000 | ORAL_TABLET | Freq: Two times a day (BID) | ORAL | 0 refills | Status: DC
Start: 2024-03-15 — End: 2024-05-12

## 2024-03-15 NOTE — Progress Notes (Signed)
 BP 130/86 (BP Location: Left Arm, Patient Position: Sitting, Cuff Size: Large)   Pulse 97   Temp 98.6 F (37 C) (Oral)   Resp 15   SpO2 97%    Subjective:    Patient ID: James Lambert, male    DOB: 05/31/1966, 58 y.o.   MRN: [id number]  HPI: James Lambert is a 58 y.o. male  Chief Complaint  Patient presents with   boils    Follow up from last visit   Patient presents to clinic with complaints of  boils on the top of his butt.  States this is an ongoing problem due to hydradenitis.  He has had drainage out of the area. When the scab rubs off of it that is when he has drainage.  He is having pain with palpation.    Relevant past medical, surgical, family and social history reviewed and updated as indicated. Interim medical history since our last visit reviewed. Allergies and medications reviewed and updated.  Review of Systems  Skin:        boils    Per HPI unless specifically indicated above     Objective:    BP 130/86 (BP Location: Left Arm, Patient Position: Sitting, Cuff Size: Large)   Pulse 97   Temp 98.6 F (37 C) (Oral)   Resp 15   SpO2 97%   Wt Readings from Last 3 Encounters:  01/06/24 245 lb 12.8 oz (111.5 kg)  10/13/23 230 lb (104.3 kg)  08/12/23 237 lb 3.2 oz (107.6 kg)    Physical Exam Vitals and nursing note reviewed.  Constitutional:      General: He is not in acute distress.    Appearance: Normal appearance. He is not ill-appearing, toxic-appearing or diaphoretic.  HENT:     Head: Normocephalic.     Right Ear: External ear normal.     Left Ear: External ear normal.     Nose: Nose normal. No congestion or rhinorrhea.     Mouth/Throat:     Mouth: Mucous membranes are moist.  Eyes:     General:        Right eye: No discharge.        Left eye: No discharge.     Extraocular Movements: Extraocular movements intact.     Conjunctiva/sclera: Conjunctivae normal.     Pupils: Pupils are equal, round, and reactive to light.  Cardiovascular:      Rate and Rhythm: Normal rate and regular rhythm.     Heart sounds: No murmur heard. Pulmonary:     Effort: Pulmonary effort is normal. No respiratory distress.     Breath sounds: Normal breath sounds. No wheezing, rhonchi or rales.  Abdominal:     General: Abdomen is flat. Bowel sounds are normal.  Musculoskeletal:     Cervical back: Normal range of motion and neck supple.  Skin:    General: Skin is warm and dry.     Capillary Refill: Capillary refill takes less than 2 seconds.       Neurological:     General: No focal deficit present.     Mental Status: He is alert and oriented to person, place, and time.  Psychiatric:        Mood and Affect: Mood normal.        Behavior: Behavior normal.        Thought Content: Thought content normal.        Judgment: Judgment normal.     Results for orders placed  or performed in visit on 01/06/24  Bayer DCA Hb A1c Waived   Collection Time: 01/06/24 10:51 AM  Result Value Ref Range   HB A1C (BAYER DCA - WAIVED) 5.2 4.8 - 5.6 %  Microalbumin, Urine Waived   Collection Time: 01/06/24 10:51 AM  Result Value Ref Range   Microalb, Ur Waived 30 (H) 0 - 19 mg/L   Creatinine, Urine Waived 300 10 - 300 mg/dL   Microalb/Creat Ratio <30 <30 mg/g  Comprehensive metabolic panel   Collection Time: 01/06/24 10:53 AM  Result Value Ref Range   Glucose 94 70 - 99 mg/dL   BUN 12 6 - 24 mg/dL   Creatinine, Ser 2.84 0.76 - 1.27 mg/dL   eGFR 73 >13 KG/MWN/0.27   BUN/Creatinine Ratio 10 9 - 20   Sodium 137 134 - 144 mmol/L   Potassium 4.2 3.5 - 5.2 mmol/L   Chloride 99 96 - 106 mmol/L   CO2 23 20 - 29 mmol/L   Calcium 8.8 8.7 - 10.2 mg/dL   Total Protein 7.2 6.0 - 8.5 g/dL   Albumin 3.8 3.8 - 4.9 g/dL   Globulin, Total 3.4 1.5 - 4.5 g/dL   Bilirubin Total 0.3 0.0 - 1.2 mg/dL   Alkaline Phosphatase 78 44 - 121 IU/L   AST 6 0 - 40 IU/L   ALT 9 0 - 44 IU/L  Lipid Panel w/o Chol/HDL Ratio   Collection Time: 01/06/24 10:53 AM  Result Value Ref Range    Cholesterol, Total 128 100 - 199 mg/dL   Triglycerides 68 0 - 149 mg/dL   HDL 41 >25 mg/dL   VLDL Cholesterol Cal 14 5 - 40 mg/dL   LDL Chol Calc (NIH) 73 0 - 99 mg/dL  CBC with Differential/Platelet   Collection Time: 01/06/24 10:53 AM  Result Value Ref Range   WBC 5.6 3.4 - 10.8 x10E3/uL   RBC 4.13 (L) 4.14 - 5.80 x10E6/uL   Hemoglobin 12.8 (L) 13.0 - 17.7 g/dL   Hematocrit 36.6 44.0 - 51.0 %   MCV 93 79 - 97 fL   MCH 31.0 26.6 - 33.0 pg   MCHC 33.4 31.5 - 35.7 g/dL   RDW 34.7 42.5 - 95.6 %   Platelets 338 150 - 450 x10E3/uL   Neutrophils 50 Not Estab. %   Lymphs 36 Not Estab. %   Monocytes 10 Not Estab. %   Eos 3 Not Estab. %   Basos 0 Not Estab. %   Neutrophils Absolute 2.9 1.4 - 7.0 x10E3/uL   Lymphocytes Absolute 2.0 0.7 - 3.1 x10E3/uL   Monocytes Absolute 0.6 0.1 - 0.9 x10E3/uL   EOS (ABSOLUTE) 0.1 0.0 - 0.4 x10E3/uL   Basophils Absolute 0.0 0.0 - 0.2 x10E3/uL   Immature Granulocytes 1 Not Estab. %   Immature Grans (Abs) 0.1 0.0 - 0.1 x10E3/uL  TSH   Collection Time: 01/06/24 10:53 AM  Result Value Ref Range   TSH 0.892 0.450 - 4.500 uIU/mL  Magnesium   Collection Time: 01/06/24 10:53 AM  Result Value Ref Range   Magnesium 1.9 1.6 - 2.3 mg/dL      Assessment & Plan:   Problem List Items Addressed This Visit       Musculoskeletal and Integument   Hydradenitis - Primary   Chronic.  Not well controlled.  Will treat with Bactrim.  Recommend using hibiclense to help prevent more boils.  Follow up in 10 days.          Follow up plan:  Return in about 10 days (around 03/25/2024) for FU Boil with Jolene.

## 2024-03-15 NOTE — Assessment & Plan Note (Signed)
 Chronic.  Not well controlled.  Will treat with Bactrim.  Recommend using hibiclense to help prevent more boils.  Follow up in 10 days.

## 2024-03-17 ENCOUNTER — Telehealth: Payer: Self-pay

## 2024-03-17 DIAGNOSIS — J449 Chronic obstructive pulmonary disease, unspecified: Secondary | ICD-10-CM

## 2024-03-17 MED ORDER — METOPROLOL SUCCINATE ER 50 MG PO TB24
50.0000 mg | ORAL_TABLET | Freq: Every day | ORAL | 1 refills | Status: DC
Start: 2024-03-17 — End: 2024-07-12

## 2024-03-17 MED ORDER — ATORVASTATIN CALCIUM 80 MG PO TABS: 80.0000 mg | ORAL_TABLET | Freq: Every day | ORAL | 41 refills | Status: DC

## 2024-03-17 MED ORDER — METOPROLOL SUCCINATE ER 50 MG PO TB24: 50.0000 mg | ORAL_TABLET | Freq: Every day | ORAL | 1 refills | Status: DC

## 2024-03-17 MED ORDER — MUPIROCIN 2 % EX OINT: 1.0000 | TOPICAL_OINTMENT | Freq: Two times a day (BID) | CUTANEOUS | 1 refills | Status: DC

## 2024-03-17 MED ORDER — ALBUTEROL SULFATE HFA 108 (90 BASE) MCG/ACT IN AERS: 1.0000 | INHALATION_SPRAY | Freq: Four times a day (QID) | RESPIRATORY_TRACT | 3 refills | Status: DC | PRN

## 2024-03-17 MED ORDER — AMLODIPINE BESYLATE 10 MG PO TABS: 10.0000 mg | ORAL_TABLET | Freq: Every day | ORAL | 1 refills | Status: DC

## 2024-03-17 MED ORDER — OLMESARTAN MEDOXOMIL-HCTZ 40-12.5 MG PO TABS
1.0000 | ORAL_TABLET | Freq: Every day | ORAL | 1 refills | Status: AC
Start: 2024-03-17 — End: ?

## 2024-03-17 MED ORDER — UMECLIDINIUM-VILANTEROL 62.5-25 MCG/ACT IN AEPB: 1.0000 | INHALATION_SPRAY | Freq: Every day | RESPIRATORY_TRACT | 4 refills | Status: DC

## 2024-03-17 MED ORDER — MUPIROCIN 2 % EX OINT: 1.0000 | TOPICAL_OINTMENT | Freq: Two times a day (BID) | CUTANEOUS | 1 refills | Status: AC

## 2024-03-17 MED ORDER — CLOPIDOGREL BISULFATE 75 MG PO TABS: 75.0000 mg | ORAL_TABLET | Freq: Every day | ORAL | 1 refills | Status: DC

## 2024-03-17 MED ORDER — UMECLIDINIUM-VILANTEROL 62.5-25 MCG/ACT IN AEPB: 1.0000 | INHALATION_SPRAY | Freq: Every day | RESPIRATORY_TRACT | 4 refills | Status: AC

## 2024-03-17 MED ORDER — AMLODIPINE BESYLATE 10 MG PO TABS
10.0000 mg | ORAL_TABLET | Freq: Every day | ORAL | 1 refills | Status: DC
Start: 2024-03-17 — End: 2024-03-17

## 2024-03-17 MED ORDER — PANTOPRAZOLE SODIUM 40 MG PO TBEC
40.0000 mg | DELAYED_RELEASE_TABLET | Freq: Every day | ORAL | 1 refills | Status: DC
Start: 2024-03-17 — End: 2024-07-12

## 2024-03-17 MED ORDER — ALBUTEROL SULFATE HFA 108 (90 BASE) MCG/ACT IN AERS: 1.0000 | INHALATION_SPRAY | Freq: Four times a day (QID) | RESPIRATORY_TRACT | 3 refills | Status: AC | PRN

## 2024-03-17 MED ORDER — PANTOPRAZOLE SODIUM 40 MG PO TBEC
40.0000 mg | DELAYED_RELEASE_TABLET | Freq: Every day | ORAL | 1 refills | Status: DC
Start: 2024-03-17 — End: 2024-03-17

## 2024-03-17 MED ORDER — OLMESARTAN MEDOXOMIL-HCTZ 40-12.5 MG PO TABS: 1.0000 | ORAL_TABLET | Freq: Every day | ORAL | 1 refills | Status: DC

## 2024-03-17 NOTE — Telephone Encounter (Signed)
 Copied from CRM [id number]. Topic: Clinical - Prescription Issue >> Mar 17, 2024 10:05 AM Elizebeth Brooking wrote: Reason for CRM: Patient called in stating he would like for all of his prescriptions to be sent over to  [facility] [place], [place] - [address] [place] [address] Phone: [phone] Fax: [phone]

## 2024-03-17 NOTE — Addendum Note (Signed)
 Addended by: Aura Dials T on: 03/17/2024 11:15 AM   Modules accepted: Orders

## 2024-03-17 NOTE — Telephone Encounter (Signed)
 Routing to provider. Can we do this? Patient has upcoming appointment 03/25/24.

## 2024-03-25 ENCOUNTER — Ambulatory Visit: Admitting: Nurse Practitioner

## 2024-04-04 ENCOUNTER — Ambulatory Visit: Admitting: Nurse Practitioner

## 2024-05-11 ENCOUNTER — Ambulatory Visit: Payer: Self-pay

## 2024-05-11 NOTE — Telephone Encounter (Signed)
 FYI Only or Action Required?: FYI only for provider  Patient was last seen in primary care on 03/15/2024 by Aileen Alexanders, NP. Called Nurse Triage reporting Skin Boils. Symptoms began 2-3 days ago. Interventions attempted: Rest, hydration, or home remedies. Symptoms are: gradually worsening.  Triage Disposition: See Physician Within 24 Hours  Patient/caregiver understands and will follow disposition?: Yes        Appointment made for 05/12/2024 at 4 PM with Megan Johnson, DO.    Copied from CRM [id number]. Topic: Clinical - Red Word Triage >> May 11, 2024 10:08 AM Lizabeth Riggs wrote: Red Word that prompted transfer to Nurse Triage:   His bumps have come. They are on his bottom and between both legs. They do drain and have odor. He has pain at a 6 or 7 level Reason for Disposition  2 or more boils  Answer Assessment - Initial Assessment Questions 1. APPEARANCE of BOIL: "What does the boil look like?"      --- 2. LOCATION: "Where is the boil located?"      3 different spots---buttocks 3. NUMBER: "How many boils are there?"      3 4. SIZE: "How big is the boil?" (e.g., inches, cm; compare to size of a coin or other object)     Quarter sized 5. ONSET: "When did the boil start?"     2-3 days ago 6. PAIN: "Is there any pain?" If Yes, ask: "How bad is the pain?"   (Scale 1-10; or mild, moderate, severe)     7 7. FEVER: "Do you have a fever?" If Yes, ask: "What is it, how was it measured, and when did it start?"      Denies 8. SOURCE: "Have you been around anyone with boils or other Staph infections?" "Have you ever had boils before?"     Previous history of same 9. OTHER SYMPTOMS: "Do you have any other symptoms?" (e.g., shaking chills, weakness, rash elsewhere on body)     Denies  Protocols used: Boil (Skin Abscess)-A-AH

## 2024-05-12 ENCOUNTER — Ambulatory Visit: Admitting: Family Medicine

## 2024-05-12 ENCOUNTER — Encounter: Payer: Self-pay | Admitting: Family Medicine

## 2024-05-12 VITALS — BP 162/100 | HR 73 | Ht 74.0 in | Wt 244.0 lb

## 2024-05-12 DIAGNOSIS — L0591 Pilonidal cyst without abscess: Secondary | ICD-10-CM

## 2024-05-12 MED ORDER — SULFAMETHOXAZOLE-TRIMETHOPRIM 800-160 MG PO TABS: 1.0000 | ORAL_TABLET | Freq: Two times a day (BID) | ORAL | 0 refills | Status: DC

## 2024-05-12 NOTE — Progress Notes (Signed)
 BP (!) 162/100   Pulse 73   Ht 6\' 2"  (1.88 m)   Wt 244 lb (110.7 kg)   SpO2 95%   BMI 31.33 kg/m    Subjective:    Patient ID: James Lambert, male    DOB: [birth year], 58 y.o.   MRN: [id number]  HPI: ANDRU GENTER is a 58 y.o. male  Chief Complaint  Patient presents with   Blister   ABSCESS Duration: 4-5 days Location: top of his buttocks Pain:  yes Quality:  sharp Severity: 7/10 Redness:  yes Swelling:  yes Warmth:  yes Oozing:  yes Pus:  yes Treatments attempted:warm compresses Past similar infections:  yes Past MRSA skin infections:  yes History of trauma in area:  no Fevers:  no Nausea/vomiting:  no   Relevant past medical, surgical, family and social history reviewed and updated as indicated. Interim medical history since our last visit reviewed. Allergies and medications reviewed and updated.  Review of Systems  Constitutional: Negative.   Respiratory: Negative.    Cardiovascular: Negative.   Skin:  Positive for color change and wound. Negative for pallor and rash.  Neurological: Negative.   Psychiatric/Behavioral: Negative.      Per HPI unless specifically indicated above     Objective:     BP (!) 162/100   Pulse 73   Ht 6\' 2"  (1.88 m)   Wt 244 lb (110.7 kg)   SpO2 95%   BMI 31.33 kg/m   Wt Readings from Last 3 Encounters:  05/12/24 244 lb (110.7 kg)  01/06/24 245 lb 12.8 oz (111.5 kg)  10/13/23 230 lb (104.3 kg)    Physical Exam Vitals and nursing note reviewed.  Constitutional:      General: He is not in acute distress.    Appearance: Normal appearance. He is not ill-appearing, toxic-appearing or diaphoretic.  HENT:     Head: Normocephalic and atraumatic.     Right Ear: External ear normal.     Left Ear: External ear normal.     Nose: Nose normal.     Mouth/Throat:     Mouth: Mucous membranes are moist.     Pharynx: Oropharynx is clear.  Eyes:     General: No scleral icterus.       Right eye: No discharge.        Left  eye: No discharge.     Extraocular Movements: Extraocular movements intact.     Conjunctiva/sclera: Conjunctivae normal.     Pupils: Pupils are equal, round, and reactive to light.  Cardiovascular:     Rate and Rhythm: Normal rate and regular rhythm.     Pulses: Normal pulses.     Heart sounds: Normal heart sounds. No murmur heard.    No friction rub. No gallop.  Pulmonary:     Effort: Pulmonary effort is normal. No respiratory distress.     Breath sounds: Normal breath sounds. No stridor. No wheezing, rhonchi or rales.  Chest:     Chest wall: No tenderness.  Musculoskeletal:        General: Normal range of motion.     Cervical back: Normal range of motion and neck supple.  Skin:    General: Skin is warm and dry.     Capillary Refill: Capillary refill takes less than 2 seconds.     Coloration: Skin is not jaundiced or pale.     Findings: No bruising, erythema, lesion or rash.       Neurological:  General: No focal deficit present.     Mental Status: He is alert and oriented to person, place, and time. Mental status is at baseline.  Psychiatric:        Mood and Affect: Mood normal.        Behavior: Behavior normal.        Thought Content: Thought content normal.        Judgment: Judgment normal.     Results for orders placed or performed in visit on 01/06/24  Bayer DCA Hb A1c Waived   Collection Time: 01/06/24 10:51 AM  Result Value Ref Range   HB A1C (BAYER DCA - WAIVED) 5.2 4.8 - 5.6 %  Microalbumin, Urine Waived   Collection Time: 01/06/24 10:51 AM  Result Value Ref Range   Microalb, Ur Waived 30 (H) 0 - 19 mg/L   Creatinine, Urine Waived 300 10 - 300 mg/dL   Microalb/Creat Ratio <30 <30 mg/g  Comprehensive metabolic panel   Collection Time: 01/06/24 10:53 AM  Result Value Ref Range   Glucose 94 70 - 99 mg/dL   BUN 12 6 - 24 mg/dL   Creatinine, Ser 5.78 0.76 - 1.27 mg/dL   eGFR 73 >46 NG/EXB/2.84   BUN/Creatinine Ratio 10 9 - 20   Sodium 137 134 - 144 mmol/L    Potassium 4.2 3.5 - 5.2 mmol/L   Chloride 99 96 - 106 mmol/L   CO2 23 20 - 29 mmol/L   Calcium  8.8 8.7 - 10.2 mg/dL   Total Protein 7.2 6.0 - 8.5 g/dL   Albumin 3.8 3.8 - 4.9 g/dL   Globulin, Total 3.4 1.5 - 4.5 g/dL   Bilirubin Total 0.3 0.0 - 1.2 mg/dL   Alkaline Phosphatase 78 44 - 121 IU/L   AST 6 0 - 40 IU/L   ALT 9 0 - 44 IU/L  Lipid Panel w/o Chol/HDL Ratio   Collection Time: 01/06/24 10:53 AM  Result Value Ref Range   Cholesterol, Total 128 100 - 199 mg/dL   Triglycerides 68 0 - 149 mg/dL   HDL 41 >13 mg/dL   VLDL Cholesterol Cal 14 5 - 40 mg/dL   LDL Chol Calc (NIH) 73 0 - 99 mg/dL  CBC with Differential/Platelet   Collection Time: 01/06/24 10:53 AM  Result Value Ref Range   WBC 5.6 3.4 - 10.8 x10E3/uL   RBC 4.13 (L) 4.14 - 5.80 x10E6/uL   Hemoglobin 12.8 (L) 13.0 - 17.7 g/dL   Hematocrit 24.4 01.0 - 51.0 %   MCV 93 79 - 97 fL   MCH 31.0 26.6 - 33.0 pg   MCHC 33.4 31.5 - 35.7 g/dL   RDW 27.2 53.6 - 64.4 %   Platelets 338 150 - 450 x10E3/uL   Neutrophils 50 Not Estab. %   Lymphs 36 Not Estab. %   Monocytes 10 Not Estab. %   Eos 3 Not Estab. %   Basos 0 Not Estab. %   Neutrophils Absolute 2.9 1.4 - 7.0 x10E3/uL   Lymphocytes Absolute 2.0 0.7 - 3.1 x10E3/uL   Monocytes Absolute 0.6 0.1 - 0.9 x10E3/uL   EOS (ABSOLUTE) 0.1 0.0 - 0.4 x10E3/uL   Basophils Absolute 0.0 0.0 - 0.2 x10E3/uL   Immature Granulocytes 1 Not Estab. %   Immature Grans (Abs) 0.1 0.0 - 0.1 x10E3/uL  TSH   Collection Time: 01/06/24 10:53 AM  Result Value Ref Range   TSH 0.892 0.450 - 4.500 uIU/mL  Magnesium   Collection Time: 01/06/24 10:53 AM  Result  Value Ref Range   Magnesium 1.9 1.6 - 2.3 mg/dL      Assessment & Plan:   Problem List Items Addressed This Visit   None Visit Diagnoses       Pilonidal cyst    -  Primary   Will treat with bactrim  and get him into general surgery ASAP. Call with any concerns.   Relevant Orders   Ambulatory referral to General Surgery         Follow up plan: Return for As scheduled.

## 2024-05-17 ENCOUNTER — Ambulatory Visit: Admitting: General Surgery

## 2024-05-29 ENCOUNTER — Emergency Department: Admission: EM | Admit: 2024-05-29 | Discharge: 2024-05-29 | Disposition: A | Discharge status: Home / Self Care

## 2024-05-29 ENCOUNTER — Emergency Department

## 2024-05-29 ENCOUNTER — Other Ambulatory Visit: Payer: Self-pay

## 2024-05-29 DIAGNOSIS — R252 Cramp and spasm: Secondary | ICD-10-CM | POA: Diagnosis present

## 2024-05-29 DIAGNOSIS — J449 Chronic obstructive pulmonary disease, unspecified: Secondary | ICD-10-CM | POA: Insufficient documentation

## 2024-05-29 DIAGNOSIS — I10 Essential (primary) hypertension: Secondary | ICD-10-CM | POA: Diagnosis not present

## 2024-05-29 LAB — COMPREHENSIVE METABOLIC PANEL WITH GFR
ALT: 7 U/L (ref 0–44)
AST: 11 U/L — ABNORMAL LOW (ref 15–41)
Albumin: 3.8 g/dL (ref 3.5–5.0)
Alkaline Phosphatase: 57 U/L (ref 38–126)
Anion gap: 9 (ref 5–15)
BUN: 10 mg/dL (ref 6–20)
CO2: 27 mmol/L (ref 22–32)
Calcium: 9.1 mg/dL (ref 8.9–10.3)
Chloride: 101 mmol/L (ref 98–111)
Creatinine, Ser: 1.02 mg/dL (ref 0.61–1.24)
GFR, Estimated: >60 mL/min (ref >60)
Glucose, Bld: 93 mg/dL (ref 70–99)
Potassium: 3.9 mmol/L (ref 3.5–5.1)
Sodium: 137 mmol/L (ref 135–145)
Total Bilirubin: 0.5 mg/dL (ref 0.0–1.2)
Total Protein: 8.1 g/dL (ref 6.5–8.1)

## 2024-05-29 LAB — URINALYSIS, ROUTINE W REFLEX MICROSCOPIC
Bilirubin Urine: NEGATIVE
Glucose, UA: NEGATIVE mg/dL
Hgb urine dipstick: NEGATIVE
Ketones, ur: NEGATIVE mg/dL
Leukocytes,Ua: NEGATIVE
Nitrite: NEGATIVE
Protein, ur: NEGATIVE mg/dL
Specific Gravity, Urine: 1.02 (ref 1.005–1.030)
pH: 5 (ref 5.0–8.0)

## 2024-05-29 LAB — CBC
HCT: 38.9 % — ABNORMAL LOW (ref 39.0–52.0)
Hemoglobin: 12.5 g/dL — ABNORMAL LOW (ref 13.0–17.0)
MCH: 30.7 pg (ref 26.0–34.0)
MCHC: 32.1 g/dL (ref 30.0–36.0)
MCV: 95.6 fL (ref 80.0–100.0)
Platelets: 385 10*3/uL (ref 150–400)
RBC: 4.07 MIL/uL — ABNORMAL LOW (ref 4.22–5.81)
RDW: 14.8 % (ref 11.5–15.5)
WBC: 5.8 10*3/uL (ref 4.0–10.5)
nRBC: 0 % (ref 0.0–0.2)

## 2024-05-29 MED ORDER — CYCLOBENZAPRINE HCL 10 MG PO TABS
5.0000 mg | ORAL_TABLET | Freq: Once | ORAL | Status: AC
  Administered 2024-05-29: 5 mg via ORAL
  Filled 2024-05-29: qty 1

## 2024-05-29 MED ORDER — DIAZEPAM 5 MG/ML IJ SOLN
5.0000 mg | Freq: Once | INTRAMUSCULAR | Status: AC
  Administered 2024-05-29: 5 mg via INTRAMUSCULAR
  Filled 2024-05-29: qty 2

## 2024-05-29 MED ORDER — ACETAMINOPHEN 500 MG PO TABS
1000.0000 mg | ORAL_TABLET | Freq: Once | ORAL | Status: AC
  Administered 2024-05-29: 1000 mg via ORAL
  Filled 2024-05-29: qty 2

## 2024-05-29 MED ORDER — CYCLOBENZAPRINE HCL 5 MG PO TABS
5.0000 mg | ORAL_TABLET | Freq: Three times a day (TID) | ORAL | 0 refills | Status: DC | PRN
Start: 2024-05-29 — End: 2024-06-29

## 2024-05-29 MED ORDER — ACETAMINOPHEN 500 MG PO TABS: 1000.0000 mg | ORAL_TABLET | Freq: Four times a day (QID) | ORAL | 2 refills | Status: DC | PRN

## 2024-05-29 NOTE — ED Notes (Signed)
 PT given a urinal per request

## 2024-05-29 NOTE — Discharge Instructions (Addendum)
 Your evaluation in the emergency department was overall reassuring.  I am unsure as to why you are having these hand spasms, but we saw no concerning underlying cause today.  I have prescribed you a muscle relaxant as well as Tylenol  to use as needed for any recurrent symptoms.  Please do follow-up with your primary care provider for reevaluation, and return to the emergency department with any new or worsening symptoms.

## 2024-05-29 NOTE — ED Notes (Signed)
 Pt is CAOx4, breathing normally, and normal in color. Pt is lying in bed and complaining of right wrist pain x1 day. Pt describes the pain as aching and tightening like spasms. Pt has obvious right sided deficits in the right arm. Same is contracted and is normal from his previous stroke. Pt in NAD at this time and denies any needs.

## 2024-05-29 NOTE — ED Triage Notes (Addendum)
 Pt comes via EMs from home with muscle spasm. Pt states last night at 2300 he had muscle spasms in right arm and down his hand. VSS  Pt does have right deficit from stroke year ago and hx of HTN.   Pt states dizziness. Pt also states he was started on new medicine for boils and not sure if that has anything to do with it. Pt denies any cp or sob. Pt states sometimes he sleeps on the right arm but this is different than the past times with spasms.

## 2024-05-29 NOTE — ED Provider Notes (Signed)
 [facility] Provider Note    Event Date/Time   First MD Initiated Contact with Patient 05/29/24 0940     (approximate)   History   Spasms  Pt comes via EMs from home with muscle spasm. Pt states last night at 2300 he had muscle spasms in right arm and down his hand. VSS  Pt does have right deficit from stroke year ago and hx of HTN.   Pt states dizziness. Pt also states he was started on new medicine for boils and not sure if that has anything to do with it. Pt denies any cp or sob. Pt states sometimes he sleeps on the right arm but this is different than the past times with spasms.    HPI James Lambert is a 58 y.o. male PMH CVA with residual right-sided deficits, COPD, seizure disorder, anemia, hypertension, hyperlipidemia, alcohol abuse presents for evaluation of reported right arm spasms - Present since yesterday evening.  No chest pain or shortness of breath.  Denies any focal weakness.  Notes contracting of his right hand due to spasms.  No headache. - Otherwise has been in his usual state of health. - Does also endorse some mild lightheadedness, denies vertigo/disequilibrium  Per chart review, appears patient was started on Bactrim  for a pilonidal cyst about 3 weeks ago.      Physical Exam   Triage Vital Signs: ED Triage Vitals  Encounter Vitals Group     BP 05/29/24 0931 (!) 170/109     Girls Systolic BP Percentile --      Girls Diastolic BP Percentile --      Boys Systolic BP Percentile --      Boys Diastolic BP Percentile --      Pulse Rate 05/29/24 0931 62     Resp 05/29/24 0931 18     Temp 05/29/24 0931 98 F (36.7 C)     Temp src --      SpO2 05/29/24 0931 99 %     Weight 05/29/24 0928 244 lb (110.7 kg)     Height 05/29/24 0928 6' 2 (1.88 m)     Head Circumference --      Peak Flow --      Pain Score --      Pain Loc --      Pain Education --      Exclude from Growth Chart --     Most recent vital signs: Vitals:    05/29/24 1230 05/29/24 1400  BP: (!) 170/110 (!) 168/109  Pulse: (!) 55 79  Resp: 18 18  Temp:  98.1 F (36.7 C)  SpO2: 100% 100%     General: Awake, no distress.  CV:  Good peripheral perfusion. RRR, RP 2+ Resp:  Normal effort. CTAB Abd:  No distention. Nontender to deep palpation throughout Neuro:  Aox4, CN II-XII intact, FNF wnl LUE, finger taps fast b/l, 5/5 strength in bilateral finger extension/grip, EHL/FHL. BUE AG 10+ sec no drift, BLE AG 5+ sec no drift. SILT. Other:  Spasm of right forearm with contracture of hand     ED Results / Procedures / Treatments   Labs (all labs ordered are listed, but only abnormal results are displayed) Labs Reviewed  COMPREHENSIVE METABOLIC PANEL WITH GFR - Abnormal; Notable for the following components:      Result Value   AST 11 (*)    All other components within normal limits  CBC - Abnormal; Notable for the following components:  RBC 4.07 (*)    Hemoglobin 12.5 (*)    HCT 38.9 (*)    All other components within normal limits  URINALYSIS, ROUTINE W REFLEX MICROSCOPIC - Abnormal; Notable for the following components:   Color, Urine YELLOW (*)    APPearance CLEAR (*)    All other components within normal limits  CBG MONITORING, ED     EKG  See ED course.    RADIOLOGY Radiology interpreted by myself and radiology report reviewed.  No acute pathology.    PROCEDURES:  Critical Care performed: No  Procedures   MEDICATIONS ORDERED IN ED: Medications  acetaminophen  (TYLENOL ) tablet 1,000 mg (1,000 mg Oral Given 05/29/24 1002)  diazepam  (VALIUM ) injection 5 mg (5 mg Intramuscular Given 05/29/24 1003)  cyclobenzaprine (FLEXERIL) tablet 5 mg (5 mg Oral Given 05/29/24 1325)     IMPRESSION / MDM / ASSESSMENT AND PLAN / ED COURSE  I reviewed the triage vital signs and the nursing notes.                              DDX/MDM/AP: Differential diagnosis includes, but is not limited to, muscle spasm, consider underlying  electrolyte abnormality, doubt medication side effect (has been on Bactrim  for weeks).  Consider poststroke side effect of recurrent stroke though not clear etiology at this time.  Plan: - Labs - IM diazepam , Tylenol  - CT head - EKG - Reassess  Patient's presentation is most consistent with acute presentation with potential threat to life or bodily function.\  ED course below.  Patient mildly improved after IM diazepam  and Tylenol  and those still with significant and persistent symptoms.  Responded better to Flexeril.  Evidence of acute underlying pathology, no significant electrolyte abnormalities, CT head stable.  Feeling better, discharged with Flexeril.  Plan for PMD follow-up.  ED return precautions in place.  Patient agrees with plan.  Clinical Course as of 05/29/24 1526  Sun May 29, 2024  0959 Ecg = sinus rhythm, rate 66, no gross ST elevation or depression, no significant repolarization abnormality, normal axis, normal intervals.  No clear evidence of ischemia nor arrhythmia on my interpretation.  Some baseline artifact. [MM]  1023 Cbc w/ no leukocytosis, stable anemia [MM]  1056 CMP reviewed, unremarkable [MM]  1125 CTH: IMPRESSION: 1. No acute intracranial abnormalities. 2. Remote infarcts involving the left frontal lobe, left frontoparietal convexity and left cerebellar hemisphere. 3. Chronic small vessel ischemic disease.   [MM]  1320 Patient still with persistent spasms, workup otherwise unremarkable.  Will trial Flexeril [MM]  1341 Patient reevaluated, spasm has improved, able to largely straighten and not as uncomfortable.  Amenable to discharge home with Flexeril and Tylenol  with plan for PMD follow-up.  No evidence of acute underlying etiology today and symptomatically improved.  ED return precautions in place.  Patient agrees with plan. [MM]    Clinical Course User Index [MM] Clarine Ozell LABOR, MD     FINAL CLINICAL IMPRESSION(S) / ED DIAGNOSES   Final diagnoses:   Hand or foot spasms     Rx / DC Orders   ED Discharge Orders          Ordered    cyclobenzaprine (FLEXERIL) 5 MG tablet  3 times daily PRN        05/29/24 1346    acetaminophen  (TYLENOL ) 500 MG tablet  Every 6 hours PRN        05/29/24 1346  Note:  This document was prepared using Dragon voice recognition software and may include unintentional dictation errors.   Clarine Ozell LABOR, MD 05/29/24 [phone]

## 2024-06-02 ENCOUNTER — Ambulatory Visit: Admitting: General Surgery

## 2024-06-03 ENCOUNTER — Encounter (HOSPITAL_COMMUNITY): Payer: Self-pay | Admitting: Interventional Radiology

## 2024-06-20 ENCOUNTER — Ambulatory Visit (INDEPENDENT_AMBULATORY_CARE_PROVIDER_SITE_OTHER): Admitting: Surgery

## 2024-06-20 ENCOUNTER — Encounter: Payer: Self-pay | Admitting: Surgery

## 2024-06-20 VITALS — BP 154/88 | HR 78 | Temp 98.8°F | Ht 74.0 in | Wt 231.0 lb

## 2024-06-20 DIAGNOSIS — L732 Hidradenitis suppurativa: Secondary | ICD-10-CM | POA: Diagnosis not present

## 2024-06-20 MED ORDER — AMOXICILLIN-POT CLAVULANATE 875-125 MG PO TABS: 1.0000 | ORAL_TABLET | Freq: Two times a day (BID) | ORAL | 0 refills | Status: AC

## 2024-06-20 NOTE — Progress Notes (Signed)
 06/20/2024  History of Present Illness: James Lambert is a 58 y.o. male presenting for evaluation of hidradenitis.  The patient has a history of hidradenitis and has had extensive I&D procedures in bilateral buttocks and the left groin/perineal area.  His last surgery was on 11/13/2022 at which time he had 8 Penrose drains placed.  These were all eventually removed.  He was supposed to have an appointment with dermatology in January of last year but he did not show up for his appointment and is unsure as to why, he reports could be due to transportation issues.  He never called them back to reschedule an appointment.  The patient reports that over the past few months he's had intermittent pain issues in the right medial buttocks and area above the gluteal cleft.  He's noticed intermittent drainage from the right medial buttocks region.  Denies any fevers or chills.  Denies any issues with the left buttocks.  Past Medical History: Past Medical History:  Diagnosis Date   Alcohol abuse    Anemia    Aortic atherosclerosis (HCC)    Asthma    COPD (chronic obstructive pulmonary disease) (HCC)    CVA (cerebral vascular accident) (HCC) 05/10/2022   right side weakness   GERD (gastroesophageal reflux disease)    Hemiparesis affecting right side as late effect of stroke (HCC)    Hidradenitis suppurativa    diagnosed in [facility] ED based on history and physical exam   HLD (hyperlipidemia)    Hypertension    Marijuana use, continuous    Seizure cerebral (HCC)    Sleep apnea    does not use cpap   Tobacco use      Past Surgical History: Past Surgical History:  Procedure Laterality Date   INCISION AND DRAINAGE ABSCESS Bilateral 11/13/2022   Procedure: INCISION AND DRAINAGE ABSCESS bilateral buttocks & left groin;  Surgeon: Desiderio Schanz, MD;  Location: [facility] ORS;  Service: General;  Laterality: Bilateral;   IR CT HEAD LTD  05/10/2022   IR FLUORO GUIDED NEEDLE PLC ASPIRATION/INJECTION LOC  03/20/2022    IR PERCUTANEOUS ART THROMBECTOMY/INFUSION INTRACRANIAL INC DIAG ANGIO  05/10/2022   IR US  GUIDE VASC ACCESS RIGHT  05/10/2022   RADIOLOGY WITH ANESTHESIA N/A 05/10/2022   Procedure: IR WITH ANESTHESIA;  Surgeon: Dolphus Carrion, MD;  Location: [facility] OR;  Service: Radiology;  Laterality: N/A;   TEE WITHOUT CARDIOVERSION N/A 03/11/2022   Procedure: TRANSESOPHAGEAL ECHOCARDIOGRAM (TEE);  Surgeon: Araque Wolm PARAS, MD;  Location: [facility] ORS;  Service: Cardiovascular;  Laterality: N/A;    Home Medications: Prior to Admission medications   Medication Sig Start Date End Date Taking? Authorizing Provider  acetaminophen  (TYLENOL ) 325 MG tablet Take 2 tablets (650 mg total) by mouth every 6 (six) hours as needed for mild pain. 09/10/22  Yes Cannady, Jolene T, NP  acetaminophen  (TYLENOL ) 500 MG tablet Take 2 tablets (1,000 mg total) by mouth every 6 (six) hours as needed. 05/29/24 05/29/25 Yes Clarine Ozell LABOR, MD  albuterol  (VENTOLIN  HFA) 108 (90 Base) MCG/ACT inhaler Inhale 1-2 puffs into the lungs every 6 (six) hours as needed for wheezing or shortness of breath. 03/17/24  Yes Cannady, Jolene T, NP  amLODipine  (NORVASC ) 10 MG tablet Take 1 tablet (10 mg total) by mouth daily. 03/17/24  Yes Cannady, Jolene T, NP  atorvastatin  (LIPITOR ) 80 MG tablet Take 1 tablet (80 mg total) by mouth daily. 03/17/24  Yes Cannady, Jolene T, NP  chlorhexidine  (HIBICLENS ) 4 % external liquid Apply topically daily  as needed. To clean abscesses on bottom when present. 03/15/24  Yes Melvin Pao, NP  clopidogrel  (PLAVIX ) 75 MG tablet Take 1 tablet (75 mg total) by mouth daily. 03/17/24  Yes Cannady, Jolene T, NP  cyclobenzaprine  (FLEXERIL ) 5 MG tablet Take 1 tablet (5 mg total) by mouth 3 (three) times daily as needed for muscle spasms. 05/29/24  Yes Clarine Ozell LABOR, MD  metoprolol  succinate (TOPROL -XL) 50 MG 24 hr tablet Take 1 tablet (50 mg total) by mouth daily with or immediately following a meal. 03/17/24  Yes Cannady, Jolene T, NP   Multiple Vitamin (MULTIVITAMIN WITH MINERALS) TABS tablet Take 1 tablet by mouth daily. 05/27/22  Yes Setzer, Sandra J, PA-C  mupirocin  ointment (BACTROBAN ) 2 % Apply 1 Application topically 2 (two) times daily. 03/17/24  Yes Cannady, Jolene T, NP  olmesartan -hydrochlorothiazide  (BENICAR  HCT) 40-12.5 MG tablet Take 1 tablet by mouth daily. 03/17/24  Yes Cannady, Jolene T, NP  pantoprazole  (PROTONIX ) 40 MG tablet Take 1 tablet (40 mg total) by mouth at bedtime. 03/17/24  Yes Cannady, Jolene T, NP  sulfamethoxazole -trimethoprim  (BACTRIM  DS) 800-160 MG tablet Take 1 tablet by mouth 2 (two) times daily. 05/12/24  Yes Johnson, Megan P, DO  thiamine  (VITAMIN B-1) 100 MG tablet Take 1 tablet (100 mg total) by mouth daily. 08/12/23  Yes Cannady, Jolene T, NP  umeclidinium-vilanterol (ANORO ELLIPTA ) 62.5-25 MCG/ACT AEPB Inhale 1 puff into the lungs daily at 6 (six) AM. 03/17/24  Yes Cannady, Jolene T, NP  amoxicillin -clavulanate (AUGMENTIN ) 875-125 MG tablet Take 1 tablet by mouth 2 (two) times daily for 10 days. 06/20/24 06/30/24  Desiderio Schanz, MD    Allergies: No Known Allergies  Review of Systems: Review of Systems  Constitutional:  Negative for chills and fever.  Respiratory:  Negative for shortness of breath.   Cardiovascular:  Negative for chest pain.  Gastrointestinal:  Negative for nausea and vomiting.  Skin:        Pain and drainage from right buttocks, pain above gluteal cleft    Physical Exam BP (!) 154/88   Pulse 78   Temp 98.8 F (37.1 C) (Oral)   Ht 6' 2 (1.88 m)   Wt 231 lb (104.8 kg)   SpO2 94%   BMI 29.66 kg/m  CONSTITUTIONAL: No acute distress HEENT:  Normocephalic, atraumatic, extraocular motion intact. RESPIRATORY:  Normal respiratory effort without pathologic use of accessory muscles. CARDIOVASCULAR: Regular rhythm and rate SKIN:  Patient has multiple scarred areas in bilateral buttocks from hidradenitis.  In the right medial buttocks, about mid level, there is a small  wound/ulceration about 5 mm size, with mild purulent drainage.  No significant erythema but there is firmness from all the scarring he has.  At the superior aspect of the gluteal cleft, he also has a small area of induration measuring about 1.5 cm in size, without any active drainage. NEUROLOGIC:  Motor and sensation is grossly normal.  Cranial nerves are grossly intact. PSYCH:  Alert and oriented to person, place and time. Affect is normal.   Assessment and Plan: This is a 58 y.o. male with chronic hidradenitis  --Discussed with the patient that the two areas are most likely again his hidradenitis that's flaring up.  Do not think this is pilonidal cyst given his history of hidradenitis.  Currently no I&D is needed and for now will treat with a new course of Augmentin  x 10 days.   --Will send a new referral to Dermatology and recommended that he not miss that appointment.  There's little that we can do as a long term maintenance and most likely he will need Dermatology.  Also discussed with him that any excision would be quite extensive as both buttocks are significantly involved with hidradenitis. --Follow up as needed.  I spent 20 minutes dedicated to the care of this patient on the date of this encounter to include pre-visit review of records, face-to-face time with the patient discussing diagnosis and management, and any post-visit coordination of care.   Aloysius Sheree Plant, MD [facility]

## 2024-06-20 NOTE — Patient Instructions (Signed)
 Hidradenitis Suppurativa    Hidradenitis suppurativa is a long-term (chronic) skin disease. It is similar to a severe form of acne, but it affects areas of the body where acne would be unusual, especially areas of the body where skin rubs against skin and becomes moist. These include:  Underarms.  Groin.  Genital area.  Buttocks.  Upper thighs.  Breasts.  Hidradenitis suppurativa may start out as small lumps or pimples caused by blocked skin pores, sweat glands, or hair follicles. Pimples may develop into deep sores that break open (rupture) and drain pus. Over time, affected areas of skin may thicken and become scarred. This condition is rare and does not spread from person to person (non-contagious).  What are the causes?  The exact cause of this condition is not known. It may be related to:  Male and male hormones.  An overactive disease-fighting system (immune system). The immune system may over-react to blocked hair follicles or sweat glands and cause swelling and pus-filled sores.  What increases the risk?  You are more likely to develop this condition if you:  Are male.  Are 23-75 years old.  Have a family history of hidradenitis suppurativa.  Have a personal history of acne.  Are overweight.  Smoke.  Take the medicine lithium.  What are the signs or symptoms?  The first symptoms are usually painful bumps in the skin, similar to pimples. The condition may get worse over time (progress), or it may only cause mild symptoms. If the disease progresses, symptoms may include:  Skin bumps getting bigger and growing deeper into the skin.  Bumps rupturing and draining pus.  Itchy, infected skin.  Skin getting thicker and scarred.  Tunnels under the skin (fistulas) where pus drains from a bump.  Pain during daily activities, such as pain during walking if your groin area is affected.  Emotional problems, such as stress or depression. This condition may affect your appearance and your ability or willingness to wear  certain clothes or do certain activities.  How is this diagnosed?  This condition is diagnosed by a health care provider who specializes in skin conditions (dermatologist). You may be diagnosed based on:  Your symptoms and medical history.  A physical exam.  Testing a pus sample for infection.  Blood tests.  How is this treated?  Your treatment will depend on how severe your symptoms are. The same treatment will not work for everybody with this condition. You may need to try several treatments to find what works best for you. Treatment may include:  Cleaning and bandaging (dressing) your wounds as needed.  Lifestyle changes, such as new skin care routines.  Taking medicines, such as:  Antibiotics.  Acne medicines.  Medicines to reduce the activity of the immune system.  A diabetes medicine (metformin).  Birth control pills, for women.  Steroids to reduce swelling and pain.  Working with a mental health care provider, if you experience emotional distress due to this condition.  If you have severe symptoms that do not get better with medicine, you may need surgery. Surgery may involve:  Using a laser to clear the skin and remove hair follicles.  Opening and draining deep sores.  Removing the areas of skin that are diseased and scarred.  Follow these instructions at home:  Medicines    Take over-the-counter and prescription medicines only as told by your health care provider.  If you were prescribed antibiotics, take them as told by your health care provider. Do not  stop using the antibiotic even if your condition improves.  Skin care  If you have open wounds, cover them with a clean dressing as told by your health care provider. Keep wounds clean by washing them gently with soap and water when you bathe.  Do not shave the areas where you get hidradenitis suppurativa.  Wear loose-fitting clothes.  Try to avoid getting overheated or sweaty. If you get sweaty or wet, change into clean, dry clothes as soon as you can.  To  help relieve pain and itchiness, cover sore areas with a warm, clean washcloth (warm compress) for 5-10 minutes as often as needed.  Your healthcare provider may recommend an antiperspirant deodorant that may be gentle on your skin.  A daily antiseptic wash to cleanse affected areas may be suggested by your healthcare provider.  General instructions  Learn as much as you can about your disease so that you have an active role in your treatment. Work closely with your health care provider to find treatments that work for you.  If you are overweight, work with your health care provider to lose weight as recommended.  Do not use any products that contain nicotine or tobacco. These products include cigarettes, chewing tobacco, and vaping devices, such as e-cigarettes. If you need help quitting, ask your health care provider.  If you struggle with living with this condition, talk with your health care provider or work with a mental health care provider as recommended.  Keep all follow-up visits.  Where to find more information  Hidradenitis Suppurativa Foundation[employer] Inc.: www.hs-foundation.org  American Academy of Dermatology: InfoExam.si  Contact a health care provider if:  You have a flare-up of hidradenitis suppurativa.  You have a fever or chills.  You have trouble controlling your symptoms at home.  You have trouble doing your daily activities because of your symptoms.  You have trouble dealing with emotional problems related to your condition.  Summary  Hidradenitis suppurativa is a long-term (chronic) skin disease. It is similar to a severe form of acne, but it affects areas of the body where acne would be unusual.  The first symptoms are usually painful bumps in the skin, similar to pimples. The condition may only cause mild symptoms, or it may get worse over time (progress).  If you have open wounds, cover them with a clean dressing as told by your health care provider. Keep wounds clean by washing them gently with  soap and water when you bathe.  Besides skin care, treatment may include medicines, laser treatment, and surgery.  This information is not intended to replace advice given to you by your health care provider. Make sure you discuss any questions you have with your health care provider.  Document Revised: 01/15/2022 Document Reviewed: 01/15/2022  Elsevier Patient Education  2024 [employer].

## 2024-06-27 ENCOUNTER — Other Ambulatory Visit: Payer: Self-pay

## 2024-06-27 ENCOUNTER — Emergency Department

## 2024-06-27 ENCOUNTER — Observation Stay
Admission: EM | Admit: 2024-06-27 | Discharge: 2024-06-29 | Disposition: A | Discharge status: Home / Self Care | Attending: Osteopathic Medicine | Admitting: Osteopathic Medicine

## 2024-06-27 DIAGNOSIS — L732 Hidradenitis suppurativa: Secondary | ICD-10-CM | POA: Insufficient documentation

## 2024-06-27 DIAGNOSIS — R569 Unspecified convulsions: Principal | ICD-10-CM | POA: Insufficient documentation

## 2024-06-27 DIAGNOSIS — J439 Emphysema, unspecified: Secondary | ICD-10-CM

## 2024-06-27 DIAGNOSIS — J45909 Unspecified asthma, uncomplicated: Secondary | ICD-10-CM | POA: Diagnosis not present

## 2024-06-27 DIAGNOSIS — R55 Syncope and collapse: Secondary | ICD-10-CM | POA: Diagnosis present

## 2024-06-27 DIAGNOSIS — I499 Cardiac arrhythmia, unspecified: Secondary | ICD-10-CM

## 2024-06-27 DIAGNOSIS — Z6829 Body mass index (BMI) 29.0-29.9, adult: Secondary | ICD-10-CM | POA: Insufficient documentation

## 2024-06-27 DIAGNOSIS — D519 Vitamin B12 deficiency anemia, unspecified: Secondary | ICD-10-CM | POA: Insufficient documentation

## 2024-06-27 DIAGNOSIS — E785 Hyperlipidemia, unspecified: Secondary | ICD-10-CM | POA: Diagnosis not present

## 2024-06-27 DIAGNOSIS — E66811 Obesity, class 1: Secondary | ICD-10-CM | POA: Diagnosis not present

## 2024-06-27 DIAGNOSIS — R252 Cramp and spasm: Secondary | ICD-10-CM | POA: Diagnosis not present

## 2024-06-27 DIAGNOSIS — G4089 Other seizures: Secondary | ICD-10-CM | POA: Diagnosis not present

## 2024-06-27 DIAGNOSIS — Z79899 Other long term (current) drug therapy: Secondary | ICD-10-CM | POA: Insufficient documentation

## 2024-06-27 DIAGNOSIS — I10 Essential (primary) hypertension: Secondary | ICD-10-CM | POA: Insufficient documentation

## 2024-06-27 DIAGNOSIS — R2990 Unspecified symptoms and signs involving the nervous system: Secondary | ICD-10-CM

## 2024-06-27 DIAGNOSIS — Z7901 Long term (current) use of anticoagulants: Secondary | ICD-10-CM | POA: Insufficient documentation

## 2024-06-27 DIAGNOSIS — G459 Transient cerebral ischemic attack, unspecified: Secondary | ICD-10-CM | POA: Diagnosis not present

## 2024-06-27 DIAGNOSIS — J449 Chronic obstructive pulmonary disease, unspecified: Secondary | ICD-10-CM | POA: Insufficient documentation

## 2024-06-27 DIAGNOSIS — K219 Gastro-esophageal reflux disease without esophagitis: Secondary | ICD-10-CM | POA: Diagnosis not present

## 2024-06-27 DIAGNOSIS — F17210 Nicotine dependence, cigarettes, uncomplicated: Secondary | ICD-10-CM | POA: Diagnosis not present

## 2024-06-27 LAB — COMPREHENSIVE METABOLIC PANEL WITH GFR
ALT: 8 U/L (ref 0–44)
ALT: 7 U/L (ref 0–44)
AST: 11 U/L — ABNORMAL LOW (ref 15–41)
AST: 12 U/L — ABNORMAL LOW (ref 15–41)
Albumin: 3.2 g/dL — ABNORMAL LOW (ref 3.5–5.0)
Albumin: 3.6 g/dL (ref 3.5–5.0)
Alkaline Phosphatase: 48 U/L (ref 38–126)
Alkaline Phosphatase: 55 U/L (ref 38–126)
Anion gap: 8 (ref 5–15)
Anion gap: 12 (ref 5–15)
BUN: 19 mg/dL (ref 6–20)
BUN: 20 mg/dL (ref 6–20)
CO2: 22 mmol/L (ref 22–32)
CO2: 24 mmol/L (ref 22–32)
Calcium: 8.1 mg/dL — ABNORMAL LOW (ref 8.9–10.3)
Calcium: 9.4 mg/dL (ref 8.9–10.3)
Chloride: 108 mmol/L (ref 98–111)
Chloride: 101 mmol/L (ref 98–111)
Creatinine, Ser: 1.19 mg/dL (ref 0.61–1.24)
Creatinine, Ser: 1.27 mg/dL — ABNORMAL HIGH (ref 0.61–1.24)
GFR, Estimated: >60 mL/min (ref >60)
GFR, Estimated: >60 mL/min (ref >60)
Glucose, Bld: 76 mg/dL (ref 70–99)
Glucose, Bld: 92 mg/dL (ref 70–99)
Potassium: 4 mmol/L (ref 3.5–5.1)
Potassium: 4 mmol/L (ref 3.5–5.1)
Sodium: 138 mmol/L (ref 135–145)
Sodium: 137 mmol/L (ref 135–145)
Total Bilirubin: 0.6 mg/dL (ref 0.0–1.2)
Total Bilirubin: 0.4 mg/dL (ref 0.0–1.2)
Total Protein: 7 g/dL (ref 6.5–8.1)
Total Protein: 7.8 g/dL (ref 6.5–8.1)

## 2024-06-27 LAB — PROTIME-INR
INR: 1.1 (ref 0.8–1.2)
Prothrombin Time: 14.6 s (ref 11.4–15.2)

## 2024-06-27 LAB — CBC WITH DIFFERENTIAL/PLATELET
Abs Immature Granulocytes: 0.05 K/uL (ref 0.00–0.07)
Basophils Absolute: 0 K/uL (ref 0.0–0.1)
Basophils Relative: 1 %
Eosinophils Absolute: 0.2 K/uL (ref 0.0–0.5)
Eosinophils Relative: 4 %
HCT: 38.5 % — ABNORMAL LOW (ref 39.0–52.0)
Hemoglobin: 12.3 g/dL — ABNORMAL LOW (ref 13.0–17.0)
Immature Granulocytes: 1 %
Lymphocytes Relative: 40 %
Lymphs Abs: 2 K/uL (ref 0.7–4.0)
MCH: 30.7 pg (ref 26.0–34.0)
MCHC: 31.9 g/dL (ref 30.0–36.0)
MCV: 96 fL (ref 80.0–100.0)
Monocytes Absolute: 0.4 K/uL (ref 0.1–1.0)
Monocytes Relative: 8 %
Neutro Abs: 2.3 K/uL (ref 1.7–7.7)
Neutrophils Relative %: 46 %
Platelets: 305 K/uL (ref 150–400)
RBC: 4.01 MIL/uL — ABNORMAL LOW (ref 4.22–5.81)
RDW: 14.3 % (ref 11.5–15.5)
WBC: 5.1 K/uL (ref 4.0–10.5)
nRBC: 0 % (ref 0.0–0.2)

## 2024-06-27 LAB — CBC
HCT: 36.8 % — ABNORMAL LOW (ref 39.0–52.0)
Hemoglobin: 12.3 g/dL — ABNORMAL LOW (ref 13.0–17.0)
MCH: 31.5 pg (ref 26.0–34.0)
MCHC: 33.4 g/dL (ref 30.0–36.0)
MCV: 94.1 fL (ref 80.0–100.0)
Platelets: 421 K/uL — ABNORMAL HIGH (ref 150–400)
RBC: 3.91 MIL/uL — ABNORMAL LOW (ref 4.22–5.81)
RDW: 14.3 % (ref 11.5–15.5)
WBC: 7.2 K/uL (ref 4.0–10.5)
nRBC: 0 % (ref 0.0–0.2)

## 2024-06-27 LAB — URINE DRUG SCREEN, QUALITATIVE (ARMC ONLY)
Amphetamines, Ur Screen: NOT DETECTED
Barbiturates, Ur Screen: NOT DETECTED
Benzodiazepine, Ur Scrn: NOT DETECTED
Cannabinoid 50 Ng, Ur ~~LOC~~: POSITIVE — AB
Cocaine Metabolite,Ur ~~LOC~~: NOT DETECTED
MDMA (Ecstasy)Ur Screen: NOT DETECTED
Methadone Scn, Ur: NOT DETECTED
Opiate, Ur Screen: NOT DETECTED
Phencyclidine (PCP) Ur S: NOT DETECTED
Tricyclic, Ur Screen: NOT DETECTED

## 2024-06-27 LAB — ETHANOL: Alcohol, Ethyl (B): <15 mg/dL (ref <15)

## 2024-06-27 LAB — DIFFERENTIAL
Abs Immature Granulocytes: 0.05 K/uL (ref 0.00–0.07)
Basophils Absolute: 0 K/uL (ref 0.0–0.1)
Basophils Relative: 0 %
Eosinophils Absolute: 0.2 K/uL (ref 0.0–0.5)
Eosinophils Relative: 3 %
Immature Granulocytes: 1 %
Lymphocytes Relative: 40 %
Lymphs Abs: 2.9 K/uL (ref 0.7–4.0)
Monocytes Absolute: 0.5 K/uL (ref 0.1–1.0)
Monocytes Relative: 7 %
Neutro Abs: 3.5 K/uL (ref 1.7–7.7)
Neutrophils Relative %: 49 %

## 2024-06-27 LAB — TROPONIN I (HIGH SENSITIVITY)
Troponin I (High Sensitivity): 2 ng/L (ref <18)
Troponin I (High Sensitivity): 3 ng/L (ref <18)

## 2024-06-27 LAB — CBG MONITORING, ED: Glucose-Capillary: 84 mg/dL (ref 70–99)

## 2024-06-27 LAB — CK: Total CK: 139 U/L (ref 49–397)

## 2024-06-27 LAB — APTT: aPTT: 28 s (ref 24–36)

## 2024-06-27 MED ORDER — ACETAMINOPHEN 650 MG RE SUPP: 650.0000 mg | Freq: Four times a day (QID) | RECTAL | Status: DC | PRN

## 2024-06-27 MED ORDER — LORAZEPAM 2 MG/ML IJ SOLN: 1.0000 mg | INTRAMUSCULAR | Status: DC | PRN

## 2024-06-27 MED ORDER — IRBESARTAN 150 MG PO TABS
300.0000 mg | ORAL_TABLET | Freq: Every day | ORAL | Status: DC
  Administered 2024-06-28 – 2024-06-29 (×2): 300 mg via ORAL
  Filled 2024-06-27 (×2): qty 2

## 2024-06-27 MED ORDER — METOPROLOL SUCCINATE ER 50 MG PO TB24
50.0000 mg | ORAL_TABLET | Freq: Every day | ORAL | Status: DC
  Administered 2024-06-28 – 2024-06-29 (×2): 50 mg via ORAL
  Filled 2024-06-27 (×2): qty 1

## 2024-06-27 MED ORDER — PANTOPRAZOLE SODIUM 40 MG PO TBEC
40.0000 mg | DELAYED_RELEASE_TABLET | Freq: Every day | ORAL | Status: DC
  Administered 2024-06-28: 40 mg via ORAL
  Filled 2024-06-27: qty 1

## 2024-06-27 MED ORDER — SULFAMETHOXAZOLE-TRIMETHOPRIM 800-160 MG PO TABS: 1.0000 | ORAL_TABLET | Freq: Two times a day (BID) | ORAL | Status: DC

## 2024-06-27 MED ORDER — AMOXICILLIN-POT CLAVULANATE 875-125 MG PO TABS
1.0000 | ORAL_TABLET | Freq: Two times a day (BID) | ORAL | Status: DC
  Administered 2024-06-27 – 2024-06-29 (×4): 1 via ORAL
  Filled 2024-06-27 (×4): qty 1

## 2024-06-27 MED ORDER — OLMESARTAN MEDOXOMIL-HCTZ 40-12.5 MG PO TABS: 1.0000 | ORAL_TABLET | Freq: Every day | ORAL | Status: DC

## 2024-06-27 MED ORDER — ASPIRIN 81 MG PO TBEC
81.0000 mg | DELAYED_RELEASE_TABLET | Freq: Every day | ORAL | Status: DC
  Administered 2024-06-27 – 2024-06-29 (×3): 81 mg via ORAL
  Filled 2024-06-27 (×3): qty 1

## 2024-06-27 MED ORDER — CLOPIDOGREL BISULFATE 75 MG PO TABS: 75.0000 mg | ORAL_TABLET | Freq: Every day | ORAL | Status: DC

## 2024-06-27 MED ORDER — ACETAMINOPHEN 325 MG PO TABS
650.0000 mg | ORAL_TABLET | Freq: Four times a day (QID) | ORAL | Status: DC | PRN
  Administered 2024-06-27 – 2024-06-29 (×2): 650 mg via ORAL
  Filled 2024-06-27 (×2): qty 2

## 2024-06-27 MED ORDER — IOHEXOL 350 MG/ML SOLN
75.0000 mL | Freq: Once | INTRAVENOUS | Status: AC | PRN
  Administered 2024-06-27: 75 mL via INTRAVENOUS

## 2024-06-27 MED ORDER — THIAMINE MONONITRATE 100 MG PO TABS
100.0000 mg | ORAL_TABLET | Freq: Every day | ORAL | Status: DC
  Administered 2024-06-28 – 2024-06-29 (×2): 100 mg via ORAL
  Filled 2024-06-27 (×2): qty 1

## 2024-06-27 MED ORDER — ALBUTEROL SULFATE (2.5 MG/3ML) 0.083% IN NEBU: 3.0000 mL | INHALATION_SOLUTION | Freq: Four times a day (QID) | RESPIRATORY_TRACT | Status: DC | PRN

## 2024-06-27 MED ORDER — MORPHINE SULFATE (PF) 2 MG/ML IV SOLN
2.0000 mg | INTRAVENOUS | Status: DC | PRN
  Administered 2024-06-28 – 2024-06-29 (×4): 2 mg via INTRAVENOUS
  Filled 2024-06-27 (×4): qty 1

## 2024-06-27 MED ORDER — CLOPIDOGREL BISULFATE 75 MG PO TABS
75.0000 mg | ORAL_TABLET | Freq: Every day | ORAL | Status: DC
  Administered 2024-06-27 – 2024-06-28 (×2): 75 mg via ORAL
  Filled 2024-06-27 (×2): qty 1

## 2024-06-27 MED ORDER — ONDANSETRON HCL 4 MG PO TABS: 4.0000 mg | ORAL_TABLET | Freq: Four times a day (QID) | ORAL | Status: DC | PRN

## 2024-06-27 MED ORDER — ADULT MULTIVITAMIN W/MINERALS CH
1.0000 | ORAL_TABLET | Freq: Every day | ORAL | Status: DC
  Administered 2024-06-28 – 2024-06-29 (×2): 1 via ORAL
  Filled 2024-06-27 (×2): qty 1

## 2024-06-27 MED ORDER — ENOXAPARIN SODIUM 40 MG/0.4ML IJ SOSY
40.0000 mg | PREFILLED_SYRINGE | INTRAMUSCULAR | Status: DC
  Administered 2024-06-27 – 2024-06-28 (×2): 40 mg via SUBCUTANEOUS
  Filled 2024-06-27 (×2): qty 0.4

## 2024-06-27 MED ORDER — AMLODIPINE BESYLATE 10 MG PO TABS
10.0000 mg | ORAL_TABLET | Freq: Every day | ORAL | Status: DC
  Administered 2024-06-28 – 2024-06-29 (×2): 10 mg via ORAL
  Filled 2024-06-27 (×2): qty 1

## 2024-06-27 MED ORDER — NICOTINE 21 MG/24HR TD PT24
21.0000 mg | MEDICATED_PATCH | Freq: Every day | TRANSDERMAL | Status: DC
  Administered 2024-06-28 – 2024-06-29 (×3): 21 mg via TRANSDERMAL
  Filled 2024-06-27 (×3): qty 1

## 2024-06-27 MED ORDER — MAGNESIUM HYDROXIDE 400 MG/5ML PO SUSP: 30.0000 mL | Freq: Every day | ORAL | Status: DC | PRN

## 2024-06-27 MED ORDER — LEVETIRACETAM (KEPPRA) 500 MG/5 ML ADULT IV PUSH
1000.0000 mg | Freq: Once | INTRAVENOUS | Status: AC
  Administered 2024-06-27: 1000 mg via INTRAVENOUS
  Filled 2024-06-27: qty 10

## 2024-06-27 MED ORDER — TRAZODONE HCL 50 MG PO TABS
25.0000 mg | ORAL_TABLET | Freq: Every evening | ORAL | Status: DC | PRN
Start: 2024-06-27 — End: 2024-06-29

## 2024-06-27 MED ORDER — LORAZEPAM 2 MG/ML IJ SOLN
2.0000 mg | Freq: Once | INTRAMUSCULAR | Status: AC | PRN
  Administered 2024-06-27: 2 mg via INTRAVENOUS
  Filled 2024-06-27: qty 1

## 2024-06-27 MED ORDER — CYCLOBENZAPRINE HCL 10 MG PO TABS
5.0000 mg | ORAL_TABLET | Freq: Three times a day (TID) | ORAL | Status: DC | PRN
  Administered 2024-06-27: 5 mg via ORAL
  Filled 2024-06-27: qty 1

## 2024-06-27 MED ORDER — STROKE: EARLY STAGES OF RECOVERY BOOK: Freq: Once | Status: AC

## 2024-06-27 MED ORDER — ATORVASTATIN CALCIUM 20 MG PO TABS
80.0000 mg | ORAL_TABLET | Freq: Every day | ORAL | Status: DC
  Administered 2024-06-28 – 2024-06-29 (×2): 80 mg via ORAL
  Filled 2024-06-27 (×2): qty 4

## 2024-06-27 MED ORDER — ONDANSETRON HCL 4 MG/2ML IJ SOLN
4.0000 mg | Freq: Four times a day (QID) | INTRAMUSCULAR | Status: DC | PRN
Start: 2024-06-27 — End: 2024-06-29
  Administered 2024-06-28 (×2): 4 mg via INTRAVENOUS
  Filled 2024-06-27 (×2): qty 2

## 2024-06-27 MED ORDER — SODIUM CHLORIDE 0.9 % IV SOLN: INTRAVENOUS | Status: AC

## 2024-06-27 MED ORDER — HYDROCHLOROTHIAZIDE 12.5 MG PO TABS
12.5000 mg | ORAL_TABLET | Freq: Every day | ORAL | Status: DC
  Administered 2024-06-28 – 2024-06-29 (×2): 12.5 mg via ORAL
  Filled 2024-06-27 (×2): qty 1

## 2024-06-27 NOTE — ED Notes (Signed)
 Pt arrived to MRI @this  time

## 2024-06-27 NOTE — ED Provider Notes (Signed)
 [facility] Provider Note    Event Date/Time   First MD Initiated Contact with Patient 06/27/24 1544     (approximate)   History   Near Syncope   HPI  James Lambert is a 58 y.o. male who presents to the ED for evaluation of Near Syncope   Reviewed PCP visit from last month, History of pilonidal cysts.  HTN, COPD, GERD, HLD, alcohol abuse and stroke.  Mild residual right sided weakness from a previous stroke.  Patient presents to the ED from home via EMS for evaluation of possible syncopal episode.  While in triage, he has change in status where he has trouble finding words and using his right side so a code stroke is called.   I first evaluate him alongside neurology and our CT department.  He has improving symptoms at that time   Physical Exam   Triage Vital Signs: ED Triage Vitals [06/27/24 1535]  Encounter Vitals Group     BP (!) 128/98     Girls Systolic BP Percentile      Girls Diastolic BP Percentile      Boys Systolic BP Percentile      Boys Diastolic BP Percentile      Pulse Rate 68     Resp 18     Temp 98.3 F (36.8 C)     Temp Source Oral     SpO2 99 %     Weight      Height      Head Circumference      Peak Flow      Pain Score      Pain Loc      Pain Education      Exclude from Growth Chart     Most recent vital signs: Vitals:   06/27/24 1900 06/27/24 1915  BP:    Pulse: 71 68  Resp:    Temp:    SpO2: 100% 100%    General: Awake, no distress.  CV:  Good peripheral perfusion.  Resp:  Normal effort.  Abd:  No distention.  MSK:  No deformity noted.  Neuro:  No ongoing seizure activity.  No acute neurologic deficits appreciated.  Some mild chronic right-sided weakness Other:     ED Results / Procedures / Treatments   Labs (all labs ordered are listed, but only abnormal results are displayed) Labs Reviewed  COMPREHENSIVE METABOLIC PANEL WITH GFR - Abnormal; Notable for the following components:      Result  Value   Calcium  8.1 (*)    Albumin 3.2 (*)    AST 11 (*)    All other components within normal limits  CBC WITH DIFFERENTIAL/PLATELET - Abnormal; Notable for the following components:   RBC 4.01 (*)    Hemoglobin 12.3 (*)    HCT 38.5 (*)    All other components within normal limits  URINE DRUG SCREEN, QUALITATIVE ([facility] ONLY) - Abnormal; Notable for the following components:   Cannabinoid 50 Ng, Ur [employer] POSITIVE (*)    All other components within normal limits  CK  ETHANOL  PROTIME-INR  APTT  CBC  COMPREHENSIVE METABOLIC PANEL WITH GFR  DIFFERENTIAL  CBG MONITORING, ED  TROPONIN I (HIGH SENSITIVITY)  TROPONIN I (HIGH SENSITIVITY)    EKG Sinus rhythm with a rate of 67 bpm.  Fairly diffuse mild J-point elevation and depressions to aVR but no STEMI criteria.  RADIOLOGY CT head interpreted by me without evidence of acute intracranial pathology  Official  radiology report(s): MR BRAIN WO CONTRAST Result Date: 06/27/2024 CLINICAL DATA:  Neuro deficit, concern for stroke, dizziness. Blurry vision, word finding difficulty, and attention to right side, right-sided facial droop. EXAM: MRI HEAD WITHOUT CONTRAST TECHNIQUE: Multiplanar, multiecho pulse sequences of the brain and surrounding structures were obtained without intravenous contrast. COMPARISON:  CT head and CTA head and neck earlier same day. FINDINGS: Limited examination. Patient unable to lie flat during examination due to involuntary right leg movement. Examination is limited due to motion artifact. Axial and coronal DWI and ADC images were obtained as well as axial T2 FLAIR images. Within these limitations there is no evidence of acute infarct. There is FLAIR hyperintensity noted within the left frontoparietal lobes likely reflecting encephalomalacia in the setting of prior infarct. IMPRESSION: Limited examination as above. No findings to suggest acute infarct. FLAIR hyperintensity in the left frontoparietal lobes suggestive of  encephalomalacia in the setting of prior infarct. Electronically Signed   By: Donnice Mania M.D.   On: 06/27/2024 19:18   CT ANGIO HEAD NECK W WO CM Result Date: 06/27/2024 CLINICAL DATA:  Stroke/TIA, determine embolic source EXAM: CT ANGIOGRAPHY HEAD AND NECK WITH CONTRAST TECHNIQUE: Multidetector CT imaging of the head and neck was performed using the standard protocol during bolus administration of intravenous contrast. Multiplanar CT image reconstructions and MIPs were obtained to evaluate the vascular anatomy. Carotid stenosis measurements (when applicable) are obtained utilizing NASCET criteria, using the distal internal carotid diameter as the denominator. RADIATION DOSE REDUCTION: This exam was performed according to the departmental dose-optimization program which includes automated exposure control, adjustment of the mA and/or kV according to patient size and/or use of iterative reconstruction technique. CONTRAST:  75mL OMNIPAQUE  IOHEXOL  350 MG/ML SOLN COMPARISON:  CT the head dated June 27, 2024. CT angiogram of the head and neck dated May 10, 2022. FINDINGS: CTA NECK FINDINGS Aortic arch: Mild calcific plaque.  No stenosis. Right carotid system: The proximal carotid artery is tortuous. There is moderate calcific plaque within the carotid bulb with less than 10% luminal stenosis. There is moderate to severe stenosis of the proximal internal carotid artery, approximately 70% by NASCET criteria. Left carotid system: The common carotid artery is normal in caliber. There is moderate calcific plaque present within the proximal internal carotid artery, with less than 50% luminal stenosis. There has been interval left carotid endarterectomy with restitution of blood flow within the distal cervical segment, which was occluded on the prior exam. Vertebral arteries: The left vertebral artery is dominant. The right vertebral artery is relatively hypoplastic. There is no significant stenosis of either vertebral  artery in the neck. Skeleton: Multilevel degenerative disc disease. Other neck: Negative. Upper chest: The lung apices are clear. Review of the MIP images confirms the above findings CTA HEAD FINDINGS Anterior circulation: There is moderate to severe stenosis of the cranial and cavernous segments of the internal carotid arteries bilaterally. There is extensive calcific atheromatous disease with probable high-grade stenoses of the ophthalmic segments and communicating segments bilaterally, particularly on the right. Estimated luminal stenosis is 70-80%. There is no clear large vessel occlusion. The anterior and middle cerebral arteries demonstrate no flow-limiting stenosis or aneurysm. Posterior circulation: There is extensive calcific plaque within the V4 segments of the vertebral arteries bilaterally, with moderate to severe stenosis of the right V4 segment. There is moderate to severe multifocal stenosis of the basilar artery, similar to the prior exam. There are moderate stenoses within the posterior cerebral arteries bilaterally. Venous sinuses: Poorly opacified.  No clear thrombosis. Anatomic variants: None. Review of the MIP images confirms the above findings IMPRESSION: 1. Moderate to severe stenosis of the cervical segment of the right internal carotid artery, proximally 70%. 2. Moderate to severe stenoses of the cavernous and supraclinoid segments of both internal carotid arteries. 3. 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 4. Status post interval left Internal carotid endarterectomy with restoration of blood flow within the distal cervical segment. These results were communicated to Dr. Jerrie at 4:30 pm on 06/27/2024 by text page via the [facility] messaging system. Electronically Signed   By: Evalene Coho M.D.   On: 06/27/2024 16:50   CT HEAD CODE  STROKE WO CONTRAST Result Date: 06/27/2024 CLINICAL DATA:  Code stroke. Right-sided weakness and leftward gaze. EXAM: CT HEAD WITHOUT CONTRAST TECHNIQUE: Contiguous axial images were obtained from the base of the skull through the vertex without intravenous contrast. RADIATION DOSE REDUCTION: This exam was performed according to the departmental dose-optimization program which includes automated exposure control, adjustment of the mA and/or kV according to patient size and/or use of iterative reconstruction technique. COMPARISON:  CT the head dated May 29, 2024. FINDINGS: Brain: There is age related cerebral volume loss. Chronic encephalomalacia changes are noted within the left posterior frontal and parietal lobes. There is a separate area of encephalomalacia present anteriorly within the left frontal lobe. There are encephalomalacia changes also again demonstrated within the left cerebellar hemisphere. There are chronic lacunar infarcts within the thalami bilaterally. There is no evidence of hemorrhage, mass, acute cortical infarct or hydrocephalus. Vascular: Extensive vascular calcifications within the carotid siphons and vertebral arteries. Skull: Intact and unremarkable. Sinuses/Orbits: The visualized paranasal sinuses and mastoid air cells are clear. The orbits are unremarkable. Other: None. IMPRESSION: 1. Chronic encephalomalacia changes within the left frontal and parietal lobes and left cerebellar hemisphere with chronic lacunar infarcts within the thalami bilaterally. No apparent acute process. 2. ASPECTS is 10. 3. These results were communicated to Dr. Jerrie at 3:58 pm on 06/27/2024 by text page via the [facility] messaging system. Electronically Signed   By: Evalene Coho M.D.   On: 06/27/2024 16:00    PROCEDURES and INTERVENTIONS:  .1-3 Lead EKG Interpretation  Performed by: Claudene Rover, MD Authorized by: Claudene Rover, MD     Interpretation: normal     ECG rate:  70   ECG rate assessment:  normal     Rhythm: sinus rhythm     Ectopy: none     Conduction: normal   .Critical Care  Performed by: Claudene Rover, MD Authorized by: Claudene Rover, MD   Critical care provider statement:    Critical care time (minutes):  30   Critical care time was exclusive of:  Separately billable procedures and treating other patients   Critical care was necessary to treat or prevent imminent or life-threatening deterioration of the following conditions:  CNS failure or compromise   Critical care was time spent personally by me on the following activities:  Development of treatment plan with patient or surrogate, discussions with consultants, evaluation of patient's response to treatment, examination of patient, ordering and review of laboratory studies, ordering and review of radiographic studies, ordering and performing treatments and interventions, pulse oximetry, re-evaluation of patient's condition and review of old charts   Medications  levETIRAcetam  (KEPPRA ) undiluted injection 1,000 mg (1,000 mg Intravenous Given 06/27/24 1629)  LORazepam  (ATIVAN ) injection 2 mg (2 mg Intravenous Given 06/27/24 1629)  iohexol  (OMNIPAQUE ) 350 MG/ML injection 75 mL (75 mLs Intravenous Contrast Given 06/27/24 1606)     IMPRESSION / MDM / ASSESSMENT AND PLAN / ED COURSE  I reviewed the triage vital signs and the nursing notes.  Differential diagnosis includes, but is not limited to, ICH, ischemic stroke, partial seizure, vasovagal episode, dehydration, AKI, sepsis  {Patient presents with symptoms of an acute illness or injury that is potentially life-threatening.  Patient presents after a presyncopal episode at home, with an episode of slurred speech and right arm jerking in triage, code stroke was called.  No ICH, LVO.  Poor quality MRI without clear infarct.  Consult with neurology and medicine for admission.  No recurrent seizure activity or status epilepticus.  Normal CK, troponin, electrolytes and  WBC.  Clinical Course as of 06/27/24 1934  Mon Jun 27, 2024  1603 I go over to CT dept , consult with Dr. Jerrie.  Likely a partial seizure from the old stroke.  Due to poor historian, aphasia and postictal.  Will obtain CT angiograms as well as MRI.  Start on Keppra  500 twice daily after IV loading.  Possible outpatient management pending clinical course [DS]  1713 I consult with DR. Bhagat again. Recomming overnight obs, EEG routine [DS]  1933 I consult with medicine who agrees to admit [DS]    Clinical Course User Index [DS] Claudene Rover, MD     FINAL CLINICAL IMPRESSION(S) / ED DIAGNOSES   Final diagnoses:  Partial seizure (HCC)  Stroke-like symptom     Rx / DC Orders   ED Discharge Orders     None        Note:  This document was prepared using Dragon voice recognition software and may include unintentional dictation errors.   Claudene Rover, MD 06/27/24 [phone]

## 2024-06-27 NOTE — Assessment & Plan Note (Signed)
-   This could be a breakthrough seizure especially with negative brain MRI. - The patient was loaded with IV Keppra . - Will place him on as needed IV Ativan . - Seizure precautions will be provided. - Will defer further antiseizure therapy to the neurologist.

## 2024-06-27 NOTE — Progress Notes (Signed)
 CODE STROKE- PHARMACY COMMUNICATION   Time CODE STROKE called/page received: 1545  Time response to CODE STROKE was made (in person or via phone): In person  Time Stroke Kit retrieved from [employer] (only if needed): TNK not given  Name of Provider/Nurse contacted: Dr. 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 Authorizing Provider  acetaminophen  (TYLENOL ) 325 MG tablet Take 2 tablets (650 mg total) by mouth every 6 (six) hours as needed for mild pain. 09/10/22   Cannady, Jolene T, NP  acetaminophen  (TYLENOL ) 500 MG tablet Take 2 tablets (1,000 mg total) by mouth every 6 (six) hours as needed. 05/29/24 05/29/25  Clarine Ozell LABOR, MD  albuterol  (VENTOLIN  HFA) 108 (90 Base) MCG/ACT inhaler Inhale 1-2 puffs into the lungs every 6 (six) hours as needed for wheezing or shortness of breath. 03/17/24   Cannady, Jolene T, NP  amLODipine  (NORVASC ) 10 MG tablet Take 1 tablet (10 mg total) by mouth daily. 03/17/24   Cannady, Jolene T, NP  amoxicillin -clavulanate (AUGMENTIN ) 875-125 MG tablet Take 1 tablet by mouth 2 (two) times daily for 10 days. 06/20/24 06/30/24  Desiderio Schanz, MD  atorvastatin  (LIPITOR ) 80 MG tablet Take 1 tablet (80 mg total) by mouth daily. 03/17/24   Cannady, Jolene T, NP  chlorhexidine  (HIBICLENS ) 4 % external liquid Apply topically daily as needed. To clean abscesses on  bottom when present. 03/15/24   Melvin Pao, NP  clopidogrel  (PLAVIX ) 75 MG tablet Take 1 tablet (75 mg total) by mouth daily. 03/17/24   Cannady, Jolene T, NP  cyclobenzaprine  (FLEXERIL ) 5 MG tablet Take 1 tablet (5 mg total) by mouth 3 (three) times daily as needed for muscle spasms. 05/29/24   Clarine Ozell LABOR, MD  metoprolol  succinate (TOPROL -XL) 50 MG 24 hr tablet Take 1 tablet (50 mg total) by mouth daily with or immediately following a meal. 03/17/24   Cannady, Jolene T, NP  Multiple Vitamin (MULTIVITAMIN WITH MINERALS) TABS tablet Take 1 tablet by mouth daily. 05/27/22   Setzer, Sandra J, PA-C  mupirocin  ointment (BACTROBAN ) 2 % Apply 1 Application topically 2 (two) times daily. 03/17/24   Cannady, Jolene T, NP  olmesartan -hydrochlorothiazide  (BENICAR  HCT) 40-12.5 MG tablet Take 1 tablet by mouth daily. 03/17/24   Cannady, Jolene T, NP  pantoprazole  (PROTONIX ) 40 MG tablet Take 1 tablet (40 mg total) by mouth at bedtime. 03/17/24   Cannady, Jolene T, NP  sulfamethoxazole -trimethoprim  (BACTRIM  DS) 800-160 MG tablet Take 1 tablet by mouth 2 (two) times daily. 05/12/24   Johnson, Megan P, DO  thiamine  (VITAMIN B-1) 100 MG tablet Take 1 tablet (100 mg total) by mouth daily. 08/12/23   Cannady, Jolene T, NP  umeclidinium-vilanterol (ANORO ELLIPTA ) 62.5-25 MCG/ACT AEPB Inhale 1 puff into the lungs daily at  6 (six) AM. 03/17/24   [name], NP    Damien Napoleon ,PharmD Clinical Pharmacist  06/27/2024  3:48 PM

## 2024-06-27 NOTE — ED Notes (Signed)
 TRANSPORT PAGED FOR PT  TO BE TRANSPORTED TO ASSIGNED INPATIENT UNIT ROOM

## 2024-06-27 NOTE — H&P (Signed)
 [place]   PATIENT NAME: James Lambert    MR#:  [id number]  DATE OF BIRTH:  [birth year]  DATE OF ADMISSION:  06/27/2024  PRIMARY CARE PHYSICIAN: Cannady, Jolene T, NP   Patient is coming from: Home  REQUESTING/REFERRING PHYSICIAN: Claudene Rover, MD  CHIEF COMPLAINT:   Chief Complaint  Patient presents with   Near Syncope    HISTORY OF PRESENT ILLNESS:  James Lambert is a 58 y.o. male with medical history significant for asthma, COPD, CVA with mild residual right-sided weakness, GERD, dyslipidemia, hypertension, seizure and OSA, who presented to the emergency room with acute onset of lightheadedness and the front porch when he was having a haircut.  He was witnessed to be shaking all over by his friend who called EMS.  He has no new neurological deficits.  He denied any new paresthesias or focal muscle weakness beyond his right lower extremity numbness from previous CVA.  He denied any slurred speech or dysphagia.  He admitted to cold chills.  No chest pain or palpitations.  No urinary or stool incontinence.  No tinnitus or vertigo.  No cough or wheezing or dyspnea.  No dysuria, oliguria or hematuria or flank pain.  He had no tongue bites.  No dysuria, oliguria or hematuria or flank pain.  In the ER the patient was witnessed to have an episode of slurred speech with right arm jerking.  This is resolved and he is back to his baseline during my interview.  ED Course: When the patient came to the ER, BP was 148/98 with otherwise normal vital signs.  Labs revealed calcium  8.1 and albumin 3.2 with otherwise unremarkable CMP.  CBC showed mild anemia at baseline.  Coag profile was normal.  Blood glucose was 76. EKG as reviewed by me :  EKG showed normal sinus rhythm with rate of 67 with septal Q waves. Imaging: Noncontrast head CT scan revealed the following: 1. Chronic encephalomalacia changes within the left frontal and parietal lobes and left cerebellar hemisphere with chronic  lacunar infarcts within the thalami bilaterally. No apparent acute process.  CTA of the head and neck revealed the following: 1. Positive CTA for emergent large vessel occlusion, with abrupt occlusion of the proximal cervical left ICA just distal to the bifurcation. Distal reconstitution at the carotid siphon with probable small amount of thrombus/clot protruding into the lumen at the supraclinoid left ICA. No other visible downstream embolic left MCA occlusion. 2. 4 cc acute core infarct at the anterior left frontal lobe, with large surrounding ischemic penumbra, mismatch volume measures 236 mL. 3. Atheromatous change about the right carotid bulb/proximal right ICA with associated stenosis of up to 55% by NASCET criteria. 4. Extensive atheromatous change throughout the intracranial circulation, with associated moderate to severe multifocal stenoses involving the right carotid siphon, V4 segments, and basilar artery as above.  Brain MRI without contrast revealed no findings to suggest acute infarct.  It showed flair hyperintensity in the left frontal parietal lobes suggestive of encephalomalacia in the setting of prior infarct.  The patient was given 2 mg of IV Ativan  and 1 g of IV Keppra  loading dose.  After [place] was notified about the patient and is aware.  He will be admitted to observation medical telemetry bed for further evaluation and management. PAST MEDICAL HISTORY:   Past Medical History:  Diagnosis Date   Alcohol abuse    Anemia    Aortic atherosclerosis (HCC)    Asthma    COPD (  chronic obstructive pulmonary disease) (HCC)    CVA (cerebral vascular accident) (HCC) 05/10/2022   right side weakness   GERD (gastroesophageal reflux disease)    Hemiparesis affecting right side as late effect of stroke (HCC)    Hidradenitis suppurativa    diagnosed in [facility] ED based on history and physical exam   HLD (hyperlipidemia)    Hypertension    Marijuana use, continuous     Seizure cerebral (HCC)    Sleep apnea    does not use cpap   Tobacco use     PAST SURGICAL HISTORY:   Past Surgical History:  Procedure Laterality Date   INCISION AND DRAINAGE ABSCESS Bilateral 11/13/2022   Procedure: INCISION AND DRAINAGE ABSCESS bilateral buttocks & left groin;  Surgeon: Desiderio Schanz, MD;  Location: [facility] ORS;  Service: General;  Laterality: Bilateral;   IR CT HEAD LTD  05/10/2022   IR FLUORO GUIDED NEEDLE PLC ASPIRATION/INJECTION LOC  03/20/2022   IR PERCUTANEOUS ART THROMBECTOMY/INFUSION INTRACRANIAL INC DIAG ANGIO  05/10/2022   IR US  GUIDE VASC ACCESS RIGHT  05/10/2022   RADIOLOGY WITH ANESTHESIA N/A 05/10/2022   Procedure: IR WITH ANESTHESIA;  Surgeon: Dolphus Carrion, MD;  Location: [facility] OR;  Service: Radiology;  Laterality: N/A;   TEE WITHOUT CARDIOVERSION N/A 03/11/2022   Procedure: TRANSESOPHAGEAL ECHOCARDIOGRAM (TEE);  Surgeon: Ingle Wolm PARAS, MD;  Location: [facility] ORS;  Service: Cardiovascular;  Laterality: N/A;    SOCIAL HISTORY:   Social History   Tobacco Use   Smoking status: Every Day    Current packs/day: 1.25    Average packs/day: 1.3 packs/day for 27.0 years (33.8 ttl pk-yrs)    Types: Cigarettes    Passive exposure: Past   Smokeless tobacco: Never   Tobacco comments:    Smoking 5-6 cigarettes per day  Substance Use Topics   Alcohol use: Yes    Comment: 1 pint per day    FAMILY HISTORY:   Family History  Problem Relation Age of Onset   Diabetes Mother    Alzheimer's disease Mother    Hypertension Father    Bone cancer Father    Diabetes Brother    Hypertension Brother    Other Maternal Grandmother        unknown medical history   Other Maternal Grandfather        unknown medical history   Other Paternal Grandmother        unknown medical history   Other Paternal Grandfather        unknown medical history    DRUG ALLERGIES:  No Known Allergies  REVIEW OF SYSTEMS:   ROS As per history of present illness. All pertinent systems  were reviewed above. Constitutional, HEENT, cardiovascular, respiratory, GI, GU, musculoskeletal, neuro, psychiatric, endocrine, integumentary and hematologic systems were reviewed and are otherwise negative/unremarkable except for positive findings mentioned above in the HPI.   MEDICATIONS AT HOME:   Prior to Admission medications   Medication Sig Start Date End Date Taking? Authorizing Provider  acetaminophen  (TYLENOL ) 325 MG tablet Take 2 tablets (650 mg total) by mouth every 6 (six) hours as needed for mild pain. 09/10/22  Yes Cannady, Jolene T, NP  acetaminophen  (TYLENOL ) 500 MG tablet Take 2 tablets (1,000 mg total) by mouth every 6 (six) hours as needed. 05/29/24 05/29/25 Yes Clarine Ozell LABOR, MD  albuterol  (VENTOLIN  HFA) 108 (90 Base) MCG/ACT inhaler Inhale 1-2 puffs into the lungs every 6 (six) hours as needed for wheezing or shortness of breath. 03/17/24  Yes Cannady,  Jolene T, NP  amLODipine  (NORVASC ) 10 MG tablet Take 1 tablet (10 mg total) by mouth daily. 03/17/24  Yes Cannady, Jolene T, NP  amoxicillin -clavulanate (AUGMENTIN ) 875-125 MG tablet Take 1 tablet by mouth 2 (two) times daily for 10 days. 06/20/24 06/30/24 Yes Piscoya, Aloysius, MD  atorvastatin  (LIPITOR ) 80 MG tablet Take 1 tablet (80 mg total) by mouth daily. 03/17/24  Yes Cannady, Jolene T, NP  chlorhexidine  (HIBICLENS ) 4 % external liquid Apply topically daily as needed. To clean abscesses on bottom when present. 03/15/24  Yes Melvin Pao, NP  clopidogrel  (PLAVIX ) 75 MG tablet Take 1 tablet (75 mg total) by mouth daily. 03/17/24  Yes Cannady, Jolene T, NP  cyclobenzaprine  (FLEXERIL ) 5 MG tablet Take 1 tablet (5 mg total) by mouth 3 (three) times daily as needed for muscle spasms. 05/29/24  Yes Clarine Ozell LABOR, MD  metoprolol  succinate (TOPROL -XL) 50 MG 24 hr tablet Take 1 tablet (50 mg total) by mouth daily with or immediately following a meal. 03/17/24  Yes Cannady, Jolene T, NP  Multiple Vitamin (MULTIVITAMIN WITH MINERALS) TABS  tablet Take 1 tablet by mouth daily. 05/27/22  Yes Setzer, Sandra J, PA-C  mupirocin  ointment (BACTROBAN ) 2 % Apply 1 Application topically 2 (two) times daily. 03/17/24  Yes Cannady, Jolene T, NP  olmesartan -hydrochlorothiazide  (BENICAR  HCT) 40-12.5 MG tablet Take 1 tablet by mouth daily. 03/17/24  Yes Cannady, Jolene T, NP  pantoprazole  (PROTONIX ) 40 MG tablet Take 1 tablet (40 mg total) by mouth at bedtime. 03/17/24  Yes Cannady, Jolene T, NP  sulfamethoxazole -trimethoprim  (BACTRIM  DS) 800-160 MG tablet Take 1 tablet by mouth 2 (two) times daily. 05/12/24  Yes Johnson, Megan P, DO  thiamine  (VITAMIN B-1) 100 MG tablet Take 1 tablet (100 mg total) by mouth daily. 08/12/23  Yes Cannady, Jolene T, NP  umeclidinium-vilanterol (ANORO ELLIPTA ) 62.5-25 MCG/ACT AEPB Inhale 1 puff into the lungs daily at 6 (six) AM. 03/17/24  Yes Cannady, Jolene T, NP      VITAL SIGNS:  Blood pressure (!) 142/81, pulse 81, temperature 98.2 F (36.8 C), temperature source Oral, resp. rate 17, height 6' 2 (1.88 m), weight 104.8 kg, SpO2 100%.  PHYSICAL EXAMINATION:  Physical Exam  GENERAL:  58 y.o.-year-old patient lying in the bed with no acute distress.  EYES: Pupils equal, round, reactive to light and accommodation. No scleral icterus. Extraocular muscles intact.  HEENT: Head atraumatic, normocephalic. Oropharynx and nasopharynx clear.  NECK:  Supple, no jugular venous distention. No thyroid enlargement, no tenderness.  LUNGS: Normal breath sounds bilaterally, no wheezing, rales,rhonchi or crepitation. No use of accessory muscles of respiration.  CARDIOVASCULAR: Regular rate and rhythm, S1, S2 normal. No murmurs, rubs, or gallops.  ABDOMEN: Soft, nondistended, nontender. Bowel sounds present. No organomegaly or mass.  EXTREMITIES: No pedal edema, cyanosis, or clubbing.  NEUROLOGIC: Cranial nerves II through XII are intact. Muscle strength 5/5 in the left upper and lower extremity and 4/5 in the right upper and lower  extremities.  Sensation intact. Gait not checked.  PSYCHIATRIC: The patient is alert and oriented x 3.  Normal affect and good eye contact. SKIN: No obvious rash, lesion, or ulcer.   LABORATORY PANEL:   CBC Recent Labs  Lab 06/27/24 2030  WBC 7.2  HGB 12.3*  HCT 36.8*  PLT 421*   ------------------------------------------------------------------------------------------------------------------  Chemistries  Recent Labs  Lab 06/27/24 1728  NA 138  K 4.0  CL 108  CO2 22  GLUCOSE 76  BUN 19  CREATININE 1.19  CALCIUM   8.1*  AST 11*  ALT 8  ALKPHOS 48  BILITOT 0.6   ------------------------------------------------------------------------------------------------------------------  Cardiac Enzymes No results for input(s): TROPONINI in the last 168 hours. ------------------------------------------------------------------------------------------------------------------  RADIOLOGY:  MR BRAIN WO CONTRAST Result Date: 06/27/2024 CLINICAL DATA:  Neuro deficit, concern for stroke, dizziness. Blurry vision, word finding difficulty, and attention to right side, right-sided facial droop. EXAM: MRI HEAD WITHOUT CONTRAST TECHNIQUE: Multiplanar, multiecho pulse sequences of the brain and surrounding structures were obtained without intravenous contrast. COMPARISON:  CT head and CTA head and neck earlier same day. FINDINGS: Limited examination. Patient unable to lie flat during examination due to involuntary right leg movement. Examination is limited due to motion artifact. Axial and coronal DWI and ADC images were obtained as well as axial T2 FLAIR images. Within these limitations there is no evidence of acute infarct. There is FLAIR hyperintensity noted within the left frontoparietal lobes likely reflecting encephalomalacia in the setting of prior infarct. IMPRESSION: Limited examination as above. No findings to suggest acute infarct. FLAIR hyperintensity in the left frontoparietal lobes  suggestive of encephalomalacia in the setting of prior infarct. Electronically Signed   By: Donnice Mania M.D.   On: 06/27/2024 19:18   CT ANGIO HEAD NECK W WO CM Result Date: 06/27/2024 CLINICAL DATA:  Stroke/TIA, determine embolic source EXAM: CT ANGIOGRAPHY HEAD AND NECK WITH CONTRAST TECHNIQUE: Multidetector CT imaging of the head and neck was performed using the standard protocol during bolus administration of intravenous contrast. Multiplanar CT image reconstructions and MIPs were obtained to evaluate the vascular anatomy. Carotid stenosis measurements (when applicable) are obtained utilizing NASCET criteria, using the distal internal carotid diameter as the denominator. RADIATION DOSE REDUCTION: This exam was performed according to the departmental dose-optimization program which includes automated exposure control, adjustment of the mA and/or kV according to patient size and/or use of iterative reconstruction technique. CONTRAST:  75mL OMNIPAQUE  IOHEXOL  350 MG/ML SOLN COMPARISON:  CT the head dated June 27, 2024. CT angiogram of the head and neck dated May 10, 2022. FINDINGS: CTA NECK FINDINGS Aortic arch: Mild calcific plaque.  No stenosis. Right carotid system: The proximal carotid artery is tortuous. There is moderate calcific plaque within the carotid bulb with less than 10% luminal stenosis. There is moderate to severe stenosis of the proximal internal carotid artery, approximately 70% by NASCET criteria. Left carotid system: The common carotid artery is normal in caliber. There is moderate calcific plaque present within the proximal internal carotid artery, with less than 50% luminal stenosis. There has been interval left carotid endarterectomy with restitution of blood flow within the distal cervical segment, which was occluded on the prior exam. Vertebral arteries: The left vertebral artery is dominant. The right vertebral artery is relatively hypoplastic. There is no significant stenosis of  either vertebral artery in the neck. Skeleton: Multilevel degenerative disc disease. Other neck: Negative. Upper chest: The lung apices are clear. Review of the MIP images confirms the above findings CTA HEAD FINDINGS Anterior circulation: There is moderate to severe stenosis of the cranial and cavernous segments of the internal carotid arteries bilaterally. There is extensive calcific atheromatous disease with probable high-grade stenoses of the ophthalmic segments and communicating segments bilaterally, particularly on the right. Estimated luminal stenosis is 70-80%. There is no clear large vessel occlusion. The anterior and middle cerebral arteries demonstrate no flow-limiting stenosis or aneurysm. Posterior circulation: There is extensive calcific plaque within the V4 segments of the vertebral arteries bilaterally, with moderate to severe stenosis of the right V4 segment.  There is moderate to severe multifocal stenosis of the basilar artery, similar to the prior exam. There are moderate stenoses within the posterior cerebral arteries bilaterally. Venous sinuses: Poorly opacified.  No clear thrombosis. Anatomic variants: None. Review of the MIP images confirms the above findings IMPRESSION: 1. Moderate to severe stenosis of the cervical segment of the right internal carotid artery, proximally 70%. 2. Moderate to severe stenoses of the cavernous and supraclinoid segments of both internal carotid arteries. 3. 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 4. Status post interval left Internal carotid endarterectomy with restoration of blood flow within the distal cervical segment. These results were communicated to Dr. Jerrie at 4:30 pm on 06/27/2024 by text page via the [facility] messaging system. Electronically Signed   By: Evalene Coho M.D.   On: 06/27/2024 16:50    CT HEAD CODE STROKE WO CONTRAST Result Date: 06/27/2024 CLINICAL DATA:  Code stroke. Right-sided weakness and leftward gaze. EXAM: CT HEAD WITHOUT CONTRAST TECHNIQUE: Contiguous axial images were obtained from the base of the skull through the vertex without intravenous contrast. RADIATION DOSE REDUCTION: This exam was performed according to the departmental dose-optimization program which includes automated exposure control, adjustment of the mA and/or kV according to patient size and/or use of iterative reconstruction technique. COMPARISON:  CT the head dated May 29, 2024. FINDINGS: Brain: There is age related cerebral volume loss. Chronic encephalomalacia changes are noted within the left posterior frontal and parietal lobes. There is a separate area of encephalomalacia present anteriorly within the left frontal lobe. There are encephalomalacia changes also again demonstrated within the left cerebellar hemisphere. There are chronic lacunar infarcts within the thalami bilaterally. There is no evidence of hemorrhage, mass, acute cortical infarct or hydrocephalus. Vascular: Extensive vascular calcifications within the carotid siphons and vertebral arteries. Skull: Intact and unremarkable. Sinuses/Orbits: The visualized paranasal sinuses and mastoid air cells are clear. The orbits are unremarkable. Other: None. IMPRESSION: 1. Chronic encephalomalacia changes within the left frontal and parietal lobes and left cerebellar hemisphere with chronic lacunar infarcts within the thalami bilaterally. No apparent acute process. 2. ASPECTS is 10. 3. These results were communicated to Dr. Jerrie at 3:58 pm on 06/27/2024 by text page via the [facility] messaging system. Electronically Signed   By: Evalene Coho M.D.   On: 06/27/2024 16:00      IMPRESSION AND PLAN:  Assessment and Plan: * TIA (transient ischemic attack) - The patient will be admitted to an observation medically monitored bed.   - We will follow neuro  checks q.4 hours for 24 hours.   - The patient will be placed on aspirin .   - Will obtain a 2D echo with bubble study .   - A neurology consultation  as well as physical/occupation/speech therapy consults will be obtained in a.m..  I notified Dr. Jerrie about the patient - The patient will be placed on statin therapy and fasting lipids will be checked.   Seizure (HCC) - This could be a breakthrough seizure especially with negative brain MRI. - The patient was loaded with IV Keppra . - Will place him on as needed IV Ativan . - Seizure precautions will be provided. - Will defer further antiseizure therapy to the neurologist.  Essential hypertension - Will continue antihypertensive therapy.  Dyslipidemia - Continue statin therapy.  GERD without esophagitis - Will continue PPI therapy.   DVT  prophylaxis: Lovenox .  Advanced Care Planning:  Code Status: full code.  Family Communication:  The plan of care was discussed in details with the patient (and family). I answered all questions. The patient agreed to proceed with the above mentioned plan. Further management will depend upon hospital course. Disposition Plan: Back to previous home environment Consults called: Neurology All the records are reviewed and case discussed with ED provider.  Status is: Observation  I certify that at the time of admission, it is my clinical judgment that the patient will require hospital care extending less than 2 midnights.                            Dispo: The patient is from: Home              Anticipated d/c is to: Home              Patient currently is not medically stable to d/c.              Difficult to place patient: No  Madison DELENA Peaches M.D on 06/27/2024 at 8:56 PM  [facility]   From 7 PM-7 AM, contact night-coverage [email or web address]  CC: Primary care physician; Cannady, Jolene T, NP

## 2024-06-27 NOTE — Assessment & Plan Note (Signed)
Will continue antihypertensive therapy.

## 2024-06-27 NOTE — ED Notes (Signed)
 RN attempted x 2 for blood draw, unsuccessful x 2

## 2024-06-27 NOTE — ED Notes (Signed)
 Patient in CT at this time.

## 2024-06-27 NOTE — ED Notes (Signed)
 Telecart activated in CT @ 1537, Patty RN notified of pt

## 2024-06-27 NOTE — ED Provider Triage Note (Signed)
 Emergency Medicine Provider Triage Evaluation Note  James Lambert , a 58 y.o. male  was evaluated in triage.  Pt complains of near syncopal episode. Brought in by EMS.  Patient was getting his haircut on his porch for approximately 30 minutes when he became dizzy and had a near syncopal episode.  During evaluation in triage patient's speech became slurred.  Slow to follow commands and answer questions.  Unsure of patient's baseline.    Review of Systems  Positive: Blurred vision Negative:   Physical Exam  There were no vitals taken for this visit. Gen:   Awake, no distress   Resp:  Normal effort  MSK:   Moves extremities without difficulty  Other:  Right sided deficits.   Medical Decision Making  Medically screening exam initiated at 3:28 PM.  Appropriate orders placed.  James Lambert was informed that the remainder of the evaluation will be completed by another provider, this initial triage assessment does not replace that evaluation, and the importance of remaining in the ED until their evaluation is complete.  Jossie, MD to triage to assess patient   James Rebbeca LABOR, PA-C 06/27/24 1545

## 2024-06-27 NOTE — Assessment & Plan Note (Signed)
 Will continue PPI therapy.

## 2024-06-27 NOTE — ED Triage Notes (Addendum)
 First nurse note: Arrived by [facility] from home. Family reports syncopal episode on his front porch X2 minutes. EMS reports patient was pale and clammy upon arrival  EMS vitals: 119/81 b/p 68HR 97% RA A&O x4  EMS administered 250cc LR  History stroke

## 2024-06-27 NOTE — Consult Note (Addendum)
 NEUROLOGY CONSULT NOTE   Date of service: June 27, 2024 Patient Name: James Lambert MRN:  [id number] DOB:  [birth year] Chief Complaint: near syncope, then witnessed right sided weakness in triage Requesting Provider: Claudene Rover, MD  History of Present Illness  COLA HIGHFILL is a 58 y.o. 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  Initially reportedly had a syncopal episode on his front porch for 2 minutes and on EMS arrival was pale/clammy with blood pressure 119/81, heart rate of 68, satting 97% on room air and otherwise alert and oriented.  He did receive 250 cc of lactated Ringer 's with EMS.  Subsequently during triage nurse arrival he was initially conversant and then mid conversation became weak on the right side with left-sided gaze and would not look towards the right or follow commands or speak.  Symptoms were improving as he came to CT scanner as a code stroke activation.  However he told triage nurse that he did not remember being unable to speak but then told me that he recalled being unable to speak.  He told me that he was having right sided arm and leg cramping, but triage nurse reported that there was no cramping until he got to the scanner.  He also reported visual difficulties but was inconsistent whether he was having trouble seeing on the left or right side.  Tracks me in all directions and counting fingers in all directions and then reported that he just thinks he needs glasses but then other times would report that he thought this was not just needing the glasses but his vision was different  During CT scan he repeatedly indicated he needed to urinate but was having  difficulty doing so despite several position changes.  Was able to void a small amount standing up after head CT was completed.  Repeatedly during CT scan kept raising the right leg in particular.  Reported he felt like his right arm was not acting properly either.  However other than some mild confusion seemed essentially at his baseline at the time of my evaluation in the CT  Regarding seizure history, it appears Seizures was coded as a discharge diagnosis on 05/16/2022 and Right leg stiffness and pain was also noted as an active problem but the patient denies prior history of seizures to me at the time of my evaluation   He notes he has been out of his medications for many months (endorses this is due to cost although initially reports he does not know why) but recently restarted them and wonders if this contributed to his symptoms  He did report he had been outside on the porch for 30 minutes getting his haircut and felt dizzy, but gave varied history and whether he lost consciousness or not.  Reported his brother would be able to give additional history but multiple attempts to reach his brother at the number in the chart were unsuccessful  He additionally presented with right arm cramping pain recently on 6/22 which seemed to improve with flexeril  and he was discharged from ED after reassuring head CT.   He is a very inconsistent historian, initially does give me a similar history to that provided to the triage nurse but then at times speech  is harder to parse and he is not fully oriented. He additionally endorsed low back pain but this was not consistent on exam.  Of note he was last followed by surgery on 06/20/2024 for intermittent pain in the right medial buttocks as well as some drainage from that area.  They noted he had not followed up with dermatology and was not able to clearly explain why although perhaps he endorsed transportation issues, and they again referred him to dermatology  Per  10/20/2022 last neurology evaluation, he had a modified Rankin score of 2, requiring a cane to walk and some left knee pain as well as complaints of neck pain, NIH of 0   LKW: 15:35  Modified rankin score: 2-Slight disability-UNABLE to perform all activities but does not need assistance IV Thrombolysis: No, history/examination inconsistent, MRI without significant acute stroke EVT: No, no new LVO   NIHSS components Score: Comment  1a Level of Conscious 0[x]  1[]  2[]  3[]      1b LOC Questions 0[]  1[x]  2[]       1c LOC Commands 0[x]  1[]  2[]       2 Best Gaze 0[x]  1[]  2[]       3 Visual 0[x]  1[]  2[]  3[]      4 Facial Palsy 0[x]  1[]  2[]  3[]      5a Motor Arm - left 0[x]  1[]  2[]  3[]  4[]  UN[]    5b Motor Arm - Right 0[x]  1[]  2[]  3[]  4[]  UN[]    6a Motor Leg - Left 0[x]  1[]  2[]  3[]  4[]  UN[]    6b Motor Leg - Right 0[x]  1[]  2[]  3[]  4[]  UN[]    7 Limb Ataxia 0[x]  1[]  2[]  UN[]      8 Sensory 0[x]  1[]  2[]  UN[]      9 Best Language 0[]  1[x]  2[]  3[]      10 Dysarthria 0[x]  1[]  2[]  UN[]      11 Extinct. and Inattention 0[x]  1[]  2[]       TOTAL: 2       ROS  Limited by possible word finding difficulties versus confusion/disorientation and overall inconsistent reporting of symptoms as described above  Past History   Past Medical History:  Diagnosis Date   Alcohol abuse    Anemia    Aortic atherosclerosis (HCC)    Asthma    COPD (chronic obstructive pulmonary disease) (HCC)    CVA (cerebral vascular accident) (HCC) 05/10/2022   right side weakness   GERD (gastroesophageal reflux disease)    Hemiparesis affecting right side as late effect of stroke (HCC)    Hidradenitis suppurativa    diagnosed in [facility] ED based on history and physical exam   HLD (hyperlipidemia)    Hypertension    Marijuana use, continuous    Seizure cerebral (HCC)    Sleep apnea    does not use cpap   Tobacco use     Past Surgical History:  Procedure Laterality Date   INCISION AND DRAINAGE ABSCESS Bilateral 11/13/2022    Procedure: INCISION AND DRAINAGE ABSCESS bilateral buttocks & left groin;  Surgeon: Desiderio Schanz, MD;  Location: [facility] ORS;  Service: General;  Laterality: Bilateral;   IR CT HEAD LTD  05/10/2022   IR FLUORO GUIDED NEEDLE PLC ASPIRATION/INJECTION LOC  03/20/2022   IR PERCUTANEOUS ART THROMBECTOMY/INFUSION INTRACRANIAL INC DIAG ANGIO  05/10/2022   IR US  GUIDE VASC ACCESS RIGHT  05/10/2022   RADIOLOGY WITH ANESTHESIA N/A 05/10/2022   Procedure: IR WITH ANESTHESIA;  Surgeon: Dolphus Carrion, MD;  Location: [facility] OR;  Service: Radiology;  Laterality: N/A;   TEE  WITHOUT CARDIOVERSION N/A 03/11/2022   Procedure: TRANSESOPHAGEAL ECHOCARDIOGRAM (TEE);  Surgeon: Capek Wolm PARAS, MD;  Location: [facility] ORS;  Service: Cardiovascular;  Laterality: N/A;    Family History: Family History  Problem Relation Age of Onset   Diabetes Mother    Alzheimer's disease Mother    Hypertension Father    Bone cancer Father    Diabetes Brother    Hypertension Brother    Other Maternal Grandmother        unknown medical history   Other Maternal Grandfather        unknown medical history   Other Paternal Grandmother        unknown medical history   Other Paternal Grandfather        unknown medical history    Social History  reports that he has been smoking cigarettes. He has a 33.8 pack-year smoking history. He has been exposed to tobacco smoke. He has never used smokeless tobacco. He reports current alcohol use. He reports current drug use. Frequency: 14.00 times per week. Drug: Marijuana.  No Known Allergies  Medications   Current Facility-Administered Medications:    levETIRAcetam  (KEPPRA ) undiluted injection 1,000 mg, 1,000 mg, Intravenous, Once, Destini Cambre L, MD   LORazepam  (ATIVAN ) injection 2 mg, 2 mg, Intravenous, Once PRN, Sarkis Rhines L, MD  Current Outpatient Medications:    acetaminophen  (TYLENOL ) 325 MG tablet, Take 2 tablets (650 mg total) by mouth every 6 (six) hours as needed for mild pain.,  Disp: 120 tablet, Rfl: 12   acetaminophen  (TYLENOL ) 500 MG tablet, Take 2 tablets (1,000 mg total) by mouth every 6 (six) hours as needed., Disp: 100 tablet, Rfl: 2   albuterol  (VENTOLIN  HFA) 108 (90 Base) MCG/ACT inhaler, Inhale 1-2 puffs into the lungs every 6 (six) hours as needed for wheezing or shortness of breath., Disp: 18 g, Rfl: 3   amLODipine  (NORVASC ) 10 MG tablet, Take 1 tablet (10 mg total) by mouth daily., Disp: 90 tablet, Rfl: 1   amoxicillin -clavulanate (AUGMENTIN ) 875-125 MG tablet, Take 1 tablet by mouth 2 (two) times daily for 10 days., Disp: 20 tablet, Rfl: 0   atorvastatin  (LIPITOR ) 80 MG tablet, Take 1 tablet (80 mg total) by mouth daily., Disp: 90 tablet, Rfl: 41   chlorhexidine  (HIBICLENS ) 4 % external liquid, Apply topically daily as needed. To clean abscesses on bottom when present., Disp: 236 mL, Rfl: 4   clopidogrel  (PLAVIX ) 75 MG tablet, Take 1 tablet (75 mg total) by mouth daily., Disp: 90 tablet, Rfl: 1   cyclobenzaprine  (FLEXERIL ) 5 MG tablet, Take 1 tablet (5 mg total) by mouth 3 (three) times daily as needed for muscle spasms., Disp: 30 tablet, Rfl: 0   metoprolol  succinate (TOPROL -XL) 50 MG 24 hr tablet, Take 1 tablet (50 mg total) by mouth daily with or immediately following a meal., Disp: 90 tablet, Rfl: 1   Multiple Vitamin (MULTIVITAMIN WITH MINERALS) TABS tablet, Take 1 tablet by mouth daily., Disp: 30 tablet, Rfl: 0   mupirocin  ointment (BACTROBAN ) 2 %, Apply 1 Application topically 2 (two) times daily., Disp: 22 g, Rfl: 1   olmesartan -hydrochlorothiazide  (BENICAR  HCT) 40-12.5 MG tablet, Take 1 tablet by mouth daily., Disp: 90 tablet, Rfl: 1   pantoprazole  (PROTONIX ) 40 MG tablet, Take 1 tablet (40 mg total) by mouth at bedtime., Disp: 90 tablet, Rfl: 1   sulfamethoxazole -trimethoprim  (BACTRIM  DS) 800-160 MG tablet, Take 1 tablet by mouth 2 (two) times daily., Disp: 20 tablet, Rfl: 0   thiamine  (VITAMIN B-1) 100 MG  tablet, Take 1 tablet (100 mg total) by mouth  daily., Disp: 90 tablet, Rfl: 4   umeclidinium-vilanterol (ANORO ELLIPTA ) 62.5-25 MCG/ACT AEPB, Inhale 1 puff into the lungs daily at 6 (six) AM., Disp: 60 each, Rfl: 4  Vitals   Vitals:   07-21-2024 1535  BP: (!) 128/98  Pulse: 68  Resp: 18  Temp: 98.3 F (36.8 C)  TempSrc: Oral  SpO2: 99%    There is no height or weight on file to calculate BMI.   Physical Exam   Constitutional: Appears well-developed and well-nourished.  Psych: Affect overall cooperative, tangential Eyes: No scleral injection.  HENT: No OP obstruction.  Head: Normocephalic.  Cardiovascular: Normal rate and regular rhythm.  Respiratory: Effort normal, non-labored breathing.  GI: Soft.  No distension. There is no tenderness.  Skin: No clear drainage from buttocks wounds at this time.  Inconsistently tender to palpation Musculoskeletal: Inconsistent pain to palpation of the lower spine  Neurologic Examination   Mental status: Poor attention/concentration.  At times seems to have difficulty following commands or clearly communicating his symptoms, but when formally asked to name and repeat does this without difficulty.  Alert.  Oriented to age, but not month (states for example  May, June, July, August).  Follows most simple commands but at times appears distracted by pain  Cranial nerves: Visual fields full to finger counting in all 4 quadrants.  Tracks examiner in all directions.  Face symmetric, tongue midline.  Sensation intact in the bilateral face  Motor: Intermittently lifts the right leg up flexed at the hip and knee.  Intermittently but not consistently reports pain with attempts to straighten the leg.  Intermittently reports pain with flexing the leg.  Intermittently reports pain in bilateral legs.  No drift of bilateral upper extremities although some slight right hand weakness is obvious on casual observation  Sensory: Reports intact throughout bilateral arms and legs  Cerebellar: Finger-to-nose  intact bilaterally.  Heel-to-shin unable to due to pain complaints  Gait: Is able to maintain steady stance in attempts to void in urinal.  Slightly unstable gait ambulating from CT scanner to wheelchair  Labs/Imaging/Neurodiagnostic studies   CBC:  Recent Labs  Lab Jul 21, 2024 1728 21-Jul-2024 2030  WBC 5.1 7.2  NEUTROABS 2.3 3.5  HGB 12.3* 12.3*  HCT 38.5* 36.8*  MCV 96.0 94.1  PLT 305 421*   Basic Metabolic Panel:  Lab Results  Component Value Date   NA 137 05/29/2024   K 3.9 05/29/2024   CO2 27 05/29/2024   GLUCOSE 93 05/29/2024   BUN 10 05/29/2024   CREATININE 1.02 05/29/2024   CALCIUM  9.1 05/29/2024   GFRNONAA >60 05/29/2024   GFRAA >60 06/04/2020   Lipid Panel:  Lab Results  Component Value Date   LDLCALC 73 01/06/2024   HgbA1c:  Lab Results  Component Value Date   HGBA1C 5.2 01/06/2024   Urine Drug Screen:     Component Value Date/Time   LABOPIA NONE DETECTED 07-21-24 1728   LABOPIA NONE DETECTED 05/11/2022 0405   COCAINSCRNUR NONE DETECTED 2024/07/21 1728   LABBENZ NONE DETECTED 07/21/24 1728   LABBENZ NONE DETECTED 05/11/2022 0405   AMPHETMU NONE DETECTED 21-Jul-2024 1728   AMPHETMU NONE DETECTED 05/11/2022 0405   THCU POSITIVE (A) 07-21-24 1728   THCU POSITIVE (A) 05/11/2022 0405   LABBARB NONE DETECTED 21-Jul-2024 1728   LABBARB NONE DETECTED 05/11/2022 0405    Alcohol Level     Component Value Date/Time   ETH <10 04/24/2023 1508  INR  Lab Results  Component Value Date   INR 1.1 05/10/2022   APTT  Lab Results  Component Value Date   APTT 34 05/10/2022   AED levels: No results found for: PHENYTOIN, ZONISAMIDE, LAMOTRIGINE, LEVETIRACETA  CT Head without contrast(Personally reviewed): 1. Chronic encephalomalacia changes within the left frontal and parietal lobes and left cerebellar hemisphere with chronic lacunar infarcts within the thalami bilaterally. No apparent acute process. 2. ASPECTS is 10.   CT angio Head and Neck  with contrast(Personally reviewed): 1. Moderate to severe stenosis of the cervical segment of the right internal carotid artery, proximally 70%. 2. Moderate to severe stenoses of the cavernous and supraclinoid segments of both internal carotid arteries. 3. 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 4. Status post interval left Internal carotid endarterectomy with restoration of blood flow within the distal cervical segment.  MRI Brain(Personally reviewed): no acute intracranial process on limited study   ASSESSMENT   James Lambert is a 58 y.o. male presenting with possible syncope, witnessed right-sided weakness/aphasia in the ED which had essentially resolved by the time of my evaluation, ongoing right leg and arm spasms.   On examination the spasms do not appear clearly rhythmic/consistent with seizure activity.  Additionally no change with Keppra  load or 2 mg of Ativan   Question some positional pain component although this was also not consistently reproducible.  Differential includes - Symptomatic hypoperfusion in the setting of syncope or arrhythmia - Possible focal seizure though semiology is fairly atypical - Possible muscle spasms  RECOMMENDATIONS   Emergent recommendations - CTA head and neck to rule out emergent LVO - 2 mg Ativan  and 1 g of Keppra  to treat possible seizure activity - MRI brain without to rule out acute stroke  Additional recommendations: - Hold off on further antiseizure medications - Routine EEG - Medical evaluation of hydradenitis suppurativa and other potential contributions to his pain per ED/primary team - Neurology will follow along  ______________________________________________________________________  Bonney Lola Jernigan MD-PhD [facility] [phone]  [facility] coverage for [facility] is from 8 AM to 4 AM in-house and 4 PM to 8 PM by telephone/video. 8 PM to 8 AM emergent questions or overnight urgent questions should be addressed to Teleneurology On-call or Jol[facility] Pack neurohospitalist; contact information can be found on AMION  CRITICAL CARE Performed by: Lola LITTIE Jernigan   Total critical care time: 70 minutes  Critical care time was exclusive of separately billable procedures and treating other patients.  Critical care was necessary to treat or prevent imminent or life-threatening deterioration -- emergent evaluation for consideration of thrombectomy / thrombolytic  Critical care was time spent personally by me on the following activities: development of treatment plan with patient and/or surrogate as well as nursing, discussions with consultants, evaluation of patient's response to treatment, examination of patient, obtaining history from patient or surrogate, ordering and performing treatments and interventions, ordering and review of laboratory studies, ordering and review of radiographic studies, pulse oximetry and re-evaluation of patient's condition.

## 2024-06-27 NOTE — Assessment & Plan Note (Signed)
-   The patient will be admitted to an observation medically monitored bed.   - We will follow neuro checks q.4 hours for 24 hours.   - The patient will be placed on aspirin .   - Will obtain a 2D echo with bubble study .   - A neurology consultation  as well as physical/occupation/speech therapy consults will be obtained in a.m..  I notified Dr. Jerrie about the patient - The patient will be placed on statin therapy and fasting lipids will be checked.

## 2024-06-27 NOTE — ED Triage Notes (Addendum)
 States he was outside sitting on the porch for about 30 mins while getting his haircut. States he felt dizzy but denies LOC. Denies Chest pain, SHOB. States he feels blurry vision in triage. In triage at 1535 pt was talking and began having trouble finding words, inattenion to R side, R side facial droop, no tfollowing commands and unable to find words, FSBS 84

## 2024-06-27 NOTE — Progress Notes (Signed)
   06/27/24 1600  Spiritual Encounters  Type of Visit Initial  Care provided to: Patient  Conversation partners present during encounter Nurse  Reason for visit Code  OnCall Visit Yes   Chaplain responded to a Code Stroke on the [place] phone.  Chaplain provided a compassionate presence and got the patient warm blankets, socks and a belonging bag.  Let patient know Chaplain Services was here to offer additional care, if needed.    Rev. Rana M. Nicholaus, M.Div. Chaplain Resident [facility]

## 2024-06-27 NOTE — ED Notes (Signed)
Code  stroke  called  to  [facility] 

## 2024-06-27 NOTE — Assessment & Plan Note (Signed)
 Continue statin therapy.

## 2024-06-27 NOTE — ED Notes (Signed)
Neurologist at bedside in CT.  

## 2024-06-28 ENCOUNTER — Observation Stay

## 2024-06-28 DIAGNOSIS — F17210 Nicotine dependence, cigarettes, uncomplicated: Secondary | ICD-10-CM | POA: Diagnosis not present

## 2024-06-28 DIAGNOSIS — R252 Cramp and spasm: Secondary | ICD-10-CM | POA: Diagnosis not present

## 2024-06-28 DIAGNOSIS — G459 Transient cerebral ischemic attack, unspecified: Secondary | ICD-10-CM | POA: Diagnosis not present

## 2024-06-28 DIAGNOSIS — I672 Cerebral atherosclerosis: Secondary | ICD-10-CM

## 2024-06-28 DIAGNOSIS — I69398 Other sequelae of cerebral infarction: Secondary | ICD-10-CM

## 2024-06-28 DIAGNOSIS — R569 Unspecified convulsions: Secondary | ICD-10-CM | POA: Diagnosis not present

## 2024-06-28 LAB — BASIC METABOLIC PANEL WITH GFR
Anion gap: 8 (ref 5–15)
BUN: 18 mg/dL (ref 6–20)
CO2: 26 mmol/L (ref 22–32)
Calcium: 8.8 mg/dL — ABNORMAL LOW (ref 8.9–10.3)
Chloride: 103 mmol/L (ref 98–111)
Creatinine, Ser: 1.16 mg/dL (ref 0.61–1.24)
GFR, Estimated: >60 mL/min (ref >60)
Glucose, Bld: 123 mg/dL — ABNORMAL HIGH (ref 70–99)
Potassium: 3.8 mmol/L (ref 3.5–5.1)
Sodium: 137 mmol/L (ref 135–145)

## 2024-06-28 LAB — HIV ANTIBODY (ROUTINE TESTING W REFLEX): HIV Screen 4th Generation wRfx: NONREACTIVE

## 2024-06-28 LAB — LIPID PANEL
Cholesterol: 75 mg/dL (ref 0–200)
HDL: 22 mg/dL — ABNORMAL LOW (ref >40)
LDL Cholesterol: 42 mg/dL (ref 0–99)
Total CHOL/HDL Ratio: 3.4 ratio
Triglycerides: 57 mg/dL (ref <150)
VLDL: 11 mg/dL (ref 0–40)

## 2024-06-28 LAB — CBC
HCT: 34.3 % — ABNORMAL LOW (ref 39.0–52.0)
Hemoglobin: 11.2 g/dL — ABNORMAL LOW (ref 13.0–17.0)
MCH: 30.6 pg (ref 26.0–34.0)
MCHC: 32.7 g/dL (ref 30.0–36.0)
MCV: 93.7 fL (ref 80.0–100.0)
Platelets: 388 K/uL (ref 150–400)
RBC: 3.66 MIL/uL — ABNORMAL LOW (ref 4.22–5.81)
RDW: 14.1 % (ref 11.5–15.5)
WBC: 6.5 K/uL (ref 4.0–10.5)
nRBC: 0 % (ref 0.0–0.2)

## 2024-06-28 LAB — VITAMIN B12: Vitamin B-12: 262 pg/mL (ref 180–914)

## 2024-06-28 LAB — HEMOGLOBIN A1C
Hgb A1c MFr Bld: 5.6 % (ref 4.8–5.6)
Mean Plasma Glucose: 114.02 mg/dL

## 2024-06-28 MED ORDER — TIZANIDINE HCL 2 MG PO TABS
2.0000 mg | ORAL_TABLET | Freq: Every day | ORAL | Status: DC | PRN
  Filled 2024-06-28: qty 1

## 2024-06-28 MED ORDER — TIZANIDINE HCL 2 MG PO TABS
2.0000 mg | ORAL_TABLET | Freq: Every day | ORAL | Status: DC
  Administered 2024-06-28: 2 mg via ORAL
  Filled 2024-06-28 (×2): qty 1

## 2024-06-28 MED ORDER — NICOTINE POLACRILEX 2 MG MT GUM
2.0000 mg | CHEWING_GUM | OROMUCOSAL | Status: DC | PRN
  Administered 2024-06-28: 2 mg via ORAL
  Filled 2024-06-28: qty 1

## 2024-06-28 MED ORDER — VITAMIN B-12 1000 MCG PO TABS
1000.0000 ug | ORAL_TABLET | Freq: Every day | ORAL | Status: DC
  Administered 2024-06-28 – 2024-06-29 (×2): 1000 ug via ORAL
  Filled 2024-06-28 (×2): qty 1

## 2024-06-28 NOTE — Progress Notes (Signed)
 Eeg done with 2 captured spells

## 2024-06-28 NOTE — Progress Notes (Signed)
 SLP Cancellation Note  Patient Details Name: James Lambert MRN: [id number] DOB: 05-03-1966   Cancelled treatment:       Reason Eval/Treat Not Completed: SLP screened, no needs identified, will sign off (chart reviewed; consulted NSG and met w/ pt. Walking w/ OT in the hall.)  Pt is a 58 y.o. male with medical history significant for ETOH use/abuse, asthma, COPD, L ICA/CVA with mild residual right-sided weakness, GERD, dyslipidemia, hypertension, seizure and OSA, who presented to the emergency room with acute onset of lightheadedness and the front porch when he was having a haircut.  He was witnessed to be shaking all over by his friend who called EMS.  He has no new neurological deficits, per MD note.  MRI: No findings to suggest acute infarct. FLAIR hyperintensity in the left frontoparietal lobes suggestive of encephalomalacia in the setting of prior infarct..  Pt denied any difficulty swallowing and is currently on a regular diet; tolerates swallowing pills w/ water per NSG. Pt conversed in Full conversation w/out expressive/receptive deficits noted; pt denied any speech-language deficits. Speech clear. Pt stated any s/s have resolved now; I don't need Speech Therapy.   No further skilled ST services indicated as pt appears at his communication Baseline. Pt agreed. NSG to reconsult if any change in status while admitted.     James Portugal, MS, CCC-SLP Speech Language Pathologist Rehab Services; [facility] [phone] (ascom) James Lambert 06/28/2024, 9:20 AM

## 2024-06-28 NOTE — Evaluation (Signed)
 Occupational Therapy Evaluation Patient Details Name: James Lambert MRN: [id number] DOB: 1966-10-03 Today's Date: 06/28/2024   History of Present Illness   Pt is a 58 year old male presented to the ED with lightheadedness. He was witnessed to be shaking all over by his friend who called EMS. with work up includes ?TIA, ?seizure     PMH significant for  asthma, COPD, CVA with mild residual right-sided weakness, GERD, dyslipidemia, hypertension, seizure and OSA     Clinical Impressions Chart reviewed, pt greeted in chair, agreeable to OT evaluation. Pt is oriented to self, place, grossly to situation; pt reports it is May. PTA pt reports he is grossly MOD I with ADL (wears slip on shoes), amb household distances with RW, uses his brothers power wheelchair to drive to [employer] to get his medications and his friends drop off groceries. 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 Pt presents with deficits in strength, endurance, activity tolerance, balance, ?cognition affecting safe and optimal ADL completion. STS completed with CGA-MIN A with RW, amb to bathroom with CGA with RW, standing to urinate with close supervision, close supervision required for standing grooming tasks. Intermittent vcs for technique required throughout. Pt will benefit from acute OT to address functional deficits and to facilitate optimal ADL performance. Pt is left in chair, all needs met.      If plan is discharge home, recommend the following:   A little help with walking and/or transfers;A little help with bathing/dressing/bathroom;Help with stairs or ramp for entrance;Assist for transportation;Assistance with cooking/housework     Functional Status Assessment   Patient has had a recent decline in their functional status and demonstrates the ability to make significant improvements in function in a reasonable and predictable amount of time.     Equipment Recommendations   Tub/shower  bench     Recommendations for Other Services         Precautions/Restrictions   Precautions Precautions: Fall Recall of Precautions/Restrictions: Intact Restrictions Weight Bearing Restrictions Per Provider Order: No     Mobility Bed Mobility               General bed mobility comments: NT in recliner pre/post session    Transfers Overall transfer level: Needs assistance Equipment used: Rolling walker (2 wheels) Transfers: Sit to/from Stand Sit to Stand: Contact guard assist, Min assist                  Balance Overall balance assessment: Needs assistance Sitting-balance support: Feet supported Sitting balance-Leahy Scale: Good     Standing balance support: Bilateral upper extremity supported, During functional activity, Reliant on assistive device for balance Standing balance-Leahy Scale: Fair                             ADL either performed or assessed with clinical judgement   ADL Overall ADL's : Needs assistance/impaired Eating/Feeding: Set up;Sitting   Grooming: Supervision/safety;Standing Grooming Details (indicate cue type and reason): with RW at sink level, verbal cues for safe technique with RW                 Toilet Transfer: Contact guard assist;Rolling walker (2 wheels);Regular Toilet;Minimal assistance   Toileting- Clothing Manipulation and Hygiene: Supervision/safety;Sitting/lateral lean       Functional mobility during ADLs: Contact guard assist;Rolling walker (2 wheels) (approx 112 feet total with RW, genu valgum with fatigue)  Vision   Vision Assessment?: Yes Visual Fields: No apparent deficits     Perception         Praxis         Pertinent Vitals/Pain Pain Assessment Pain Assessment: 0-10 Pain Score: 7  Pain Location: R knee Pain Descriptors / Indicators: Discomfort, Guarding Pain Intervention(s): Limited activity within patient's tolerance, Repositioned, Monitored during session      Extremity/Trunk Assessment Upper Extremity Assessment Upper Extremity Assessment: Right hand dominant;RUE deficits/detail (LUE grossly WFL) RUE Deficits / Details: shoulder, elbow, wrist appear WFL; noted R hand grip strength weakness (moderate) with impaired FMC/dexterity; Pt is able to hold his untensil to feed himself but it takes increased time, support on the tray table RUE Sensation: WNL RUE Coordination: decreased fine motor;decreased gross motor   Lower Extremity Assessment Lower Extremity Assessment: Defer to PT evaluation       Communication Communication Communication: No apparent difficulties   Cognition Arousal: Alert Behavior During Therapy: WFL for tasks assessed/performed Cognition: History of cognitive impairments, Cognition impaired   Orientation impairments: Time (May)       Executive functioning impairment (select all impairments): Problem solving, Reasoning OT - Cognition Comments: some ?safety awareness and overall awareness of deficits noted, will continue to assess                 Following commands: Intact       Cueing  General Comments   Cueing Techniques: Verbal cues  vss throughout   Exercises Other Exercises Other Exercises: edu re: role of OT, role of rehab, discharge recommendations, safe ADL completion with AE/DME   Shoulder Instructions      Home Living Family/patient expects to be discharged to:: Private residence Living Arrangements: Other relatives (brother) Available Help at Discharge: Friend(s);Available PRN/intermittently (pt reports he helps care for his brother) Type of Home: Apartment Home Access: Stairs to enter Entrance Stairs-Number of Steps: 4 Entrance Stairs-Rails:  (one middle handrail) Home Layout: Able to live on main level with bedroom/bathroom;Multi-level     Bathroom Shower/Tub: Chief Strategy Officer: Standard Bathroom Accessibility: Yes   Home Equipment: Agricultural consultant (2  wheels);BSC/3in1 (reports he borrows his brothers rollator and power wheelchair)          Prior Functioning/Environment Prior Level of Function : Independent/Modified Independent;Needs assist             Mobility Comments: pt reports he amb with RW in the house, borrows brother's power wheelchair to drive [employer] to [employer] to pick up mediciations, his friend will deliver groceries to them bc he does not drive ADLs Comments: pt reports MOD I with ADL, PRN assist for IADL    OT Problem List: Decreased strength;Impaired balance (sitting and/or standing);Decreased cognition;Decreased safety awareness;Decreased activity tolerance;Decreased knowledge of use of DME or AE;Impaired UE functional use   OT Treatment/Interventions: Self-care/ADL training;Therapeutic exercise;Patient/family education;Balance training;Energy conservation;DME and/or AE instruction;Cognitive remediation/compensation;Therapeutic activities;Neuromuscular education      OT Goals(Current goals can be found in the care plan section)   Acute Rehab OT Goals Patient Stated Goal: go home OT Goal Formulation: With patient Time For Goal Achievement: 07/11/24 Potential to Achieve Goals: Good ADL Goals Pt Will Perform Grooming: with modified independence;sitting;standing Pt Will Perform Lower Body Dressing: with modified independence;sitting/lateral leans Pt Will Transfer to Toilet: with modified independence;ambulating Pt Will Perform Toileting - Clothing Manipulation and hygiene: with modified independence;sitting/lateral leans;sit to/from stand Pt/caregiver will Perform Home Exercise Program: Right Upper extremity;With written HEP provided (R hand FMC/dexterity)  OT Frequency:  Min 2X/week    Co-evaluation PT/OT/SLP Co-Evaluation/Treatment: Yes Reason for Co-Treatment: Necessary to address cognition/behavior during functional activity;To address functional/ADL transfers   OT goals addressed during session: ADL's and  self-care      AM-PAC OT 6 Clicks Daily Activity     Outcome Measure Help from another person eating meals?: None Help from another person taking care of personal grooming?: None Help from another person toileting, which includes using toliet, bedpan, or urinal?: None Help from another person bathing (including washing, rinsing, drying)?: A Little Help from another person to put on and taking off regular upper body clothing?: None Help from another person to put on and taking off regular lower body clothing?: A Little 6 Click Score: 22   End of Session Equipment Utilized During Treatment: Rolling walker (2 wheels);Gait belt Nurse Communication:  (PT student contacting nurse for update)  Activity Tolerance: Patient tolerated treatment well Patient left: in chair;with call bell/phone within reach;with chair alarm set  OT Visit Diagnosis: Other abnormalities of gait and mobility (R26.89)                Time: 9141-9074 OT Time Calculation (min): 27 min Charges:  OT General Charges $OT Visit: 1 Visit OT Evaluation $OT Eval Moderate Complexity: 1 Mod Therisa Sheffield, OTD OTR/L  06/28/24, 10:54 AM

## 2024-06-28 NOTE — Evaluation (Signed)
 Physical Therapy Evaluation Patient Details Name: James Lambert MRN: [id number] DOB: 1966/07/02 Today's Date: 06/28/2024  History of Present Illness  Pt is a 58 y.o. male who presents to the ED on 06/27/24 with acute onset of lightheadedness and reports of shaking with a near syncopal episode, while getting a haircut. Pt denies new paraesthesias, new muscular weakness, or slurred speech. Pt was admitted for seizure, HTN, dislipidimia, and TIA. Imaging revealed no acute intracranial abnormalities, or findings to suggest acute infarct. 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 PMH: tobacco abuse, alcohol abuse, stroke with residual right-sided weakness, HTN, HLD, COPD, asthma, obesity, chronic bilateral knee pain, OSA not on CPAP,  Clinical Impression  PT/OT performed co-evaluation. Prior to recent medical concerns, pt reports limited ambulation at home with RW and MOD I with ADL; lives with brother (with a disability), whom he cares for, in an apartment with 4 STE. Currently pt is performing STS with Min/CGA (for increased safety and requiring additional time to complete), is able to ambulate 60ft with RW and CGA from recliner to bathroom, able to maintain standing position for ~3 min to toilet/perform hand hygiene and ambulating 90 ft towards hallway/back to room with RW and CGA. Pt currently reports increased weakness and decreased speed with ambulation compared to baseline. Pt would currently benefit from skilled PT to address noted impairments and functional limitations (see below for any additional details).  Upon hospital discharge, pt would benefit from ongoing therapy.       If plan is discharge home, recommend the following: A little help with walking and/or transfers;A little help with  bathing/dressing/bathroom;Assist for transportation;Help with stairs or ramp for entrance   Can travel by private vehicle        Equipment Recommendations None recommended by PT  Recommendations for Other Services       Functional Status Assessment Patient has had a recent decline in their functional status and demonstrates the ability to make significant improvements in function in a reasonable and predictable amount of time.     Precautions / Restrictions Precautions Precautions: Fall Recall of Precautions/Restrictions: Intact Restrictions Weight Bearing Restrictions Per Provider Order: No      Mobility  Bed Mobility               General bed mobility comments: NT pt resting in recliner upon entry/exit    Transfers Overall transfer level: Needs assistance Equipment used: Rolling walker (2 wheels) Transfers: Sit to/from Stand Sit to Stand: Contact guard assist, Min assist           General transfer comment: 1x STS from recliner, vc's for hand placement and sequencing. Required increased time to perform    Ambulation/Gait Ambulation/Gait assistance: Contact guard assist   Assistive device: Rolling walker (2 wheels) Gait Pattern/deviations: Step-to pattern, Wide base of support, Decreased dorsiflexion - right (Genu valgum with increased fatigue) Gait velocity: decreased     General Gait Details: Increased supination of R foot with limited ability to dorsiflex. Pt shows no heelstrike on R LE during RLE loading and increased genu valgum noted with fatigue. Pt able to ambulate 83ft with RW and CGA from recliner to bathroom, able to maintain standing position for ~3 min to toilet/perform hand hygiene and ambulated 90 ft towards hallway/back to room with RW and CGA.       Balance Overall balance  assessment: Needs assistance Sitting-balance support: Feet supported Sitting balance-Leahy Scale: Good Sitting balance - Comments: steady sitting, able reaching  within BOS   Standing balance support: Bilateral upper extremity supported, During functional activity, Reliant on assistive device for balance Standing balance-Leahy Scale: Fair Standing balance comment: Forward flexed on RW secondary to  RLE  knee pain                             Pertinent Vitals/Pain Pain Assessment Pain Intervention(s): Limited activity within patient's tolerance, Repositioned, Monitored during session, Patient requesting pain meds-RN notified    Home Living Family/patient expects to be discharged to:: Private residence Living Arrangements: Other relatives (brother) Available Help at Discharge: Friend(s);Available PRN/intermittently (pt reports he helps care for his brother) Type of Home: Apartment Home Access: Stairs to enter Entrance Stairs-Rails:  (one middle handrail) Entrance Stairs-Number of Steps: 4   Home Layout: Able to live on main level with bedroom/bathroom;Multi-level Home Equipment: Rolling Walker (2 wheels);BSC/3in1;Grab bars - tub/shower (reports he borrows his brothers rollator and power wheelchair) Additional Comments: 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    Prior Function Prior Level of Function : Independent/Modified Independent;Need assist              Mobility Comments: Pt reports limited ambulation at home with RW, utilzing brother's power wheelchair to get to [facility] for medication pickup. ADLs Comments: pt reports MOD I with ADL, PRN assist for IADL     Extremity/Trunk Assessment   Upper Extremity Assessment Upper Extremity Assessment: Defer to OT evaluation RUE Deficits / Details: shoulder, elbow, wrist appear WFL; noted R hand grip strength weakness (moderate) with impaired FMC/dexterity; Pt is able to hold his untensil to feed himself but it takes increased time, support on the tray table RUE Sensation: WNL RUE Coordination: decreased fine motor;decreased gross motor    Lower Extremity  Assessment Lower Extremity Assessment: RLE deficits/detail;LLE deficits/detail RLE Deficits / Details: Hip flexion: 4/5, Knee extension: 3/5, Knee flexion: 3/5, DF: -3/5, PF: 5/5 RLE Sensation: WNL LLE Deficits / Details: Hip flexion: 4/5, Knee extension: 5/5, Knee flexion: 4+/5, DF: 4/5, PF: 5/5 LLE Sensation: WNL    Cervical / Trunk Assessment Cervical / Trunk Assessment: Normal  Communication   Communication Communication: No apparent difficulties    Cognition Arousal: Alert Behavior During Therapy: WFL for tasks assessed/performed   PT - Cognitive impairments: No apparent impairments                         Following commands: Intact       Cueing Cueing Techniques: Verbal cues     General Comments General comments (skin integrity, edema, etc.): Pre session: HR: 76 bpm, SpO2: 98% on room air,  Post session: HR: 77 bpm, SpO2: 99% on room air.    Exercises     Assessment/Plan    PT Assessment Patient needs continued PT services  PT Problem List Decreased strength;Decreased mobility;Decreased safety awareness;Decreased activity tolerance;Decreased balance       PT Treatment Interventions DME instruction;Therapeutic activities;Gait training;Therapeutic exercise;Stair training;Balance training;Functional mobility training;Neuromuscular re-education    PT Goals (Current goals can be found in the Care Plan section)  Acute Rehab PT Goals Patient Stated Goal: to get stronger PT Goal Formulation: With patient Time For Goal Achievement: 07/12/24 Potential to Achieve Goals: Good    Frequency Min 2X/week     Co-evaluation PT/OT/SLP Co-Evaluation/Treatment: Yes Reason for Co-Treatment: Necessary  to address cognition/behavior during functional activity;To address functional/ADL transfers PT goals addressed during session: Mobility/safety with mobility;Balance OT goals addressed during session: ADL's and self-care       AM-PAC PT 6 Clicks Mobility   Outcome Measure Help needed turning from your back to your side while in a flat bed without using bedrails?: A Little Help needed moving from lying on your back to sitting on the side of a flat bed without using bedrails?: A Little Help needed moving to and from a bed to a chair (including a wheelchair)?: A Little Help needed standing up from a chair using your arms (e.g., wheelchair or bedside chair)?: A Little Help needed to walk in hospital room?: A Little Help needed climbing 3-5 steps with a railing? : A Lot 6 Click Score: 17    End of Session Equipment Utilized During Treatment: Gait belt Activity Tolerance: Patient tolerated treatment well Patient left: in chair;with chair alarm set;with call bell/phone within reach Nurse Communication: Mobility status;Patient requests pain meds PT Visit Diagnosis: Unsteadiness on feet (R26.81);Other abnormalities of gait and mobility (R26.89);Muscle weakness (generalized) (M62.81)    Time: 9141-9074 PT Time Calculation (min) (ACUTE ONLY): 27 min   Charges:               Kameran Mcneese, SPT 06/28/24, 11:34 AM

## 2024-06-28 NOTE — Progress Notes (Signed)
 NEUROLOGY CONSULT FOLLOW UP NOTE   Date of service: June 28, 2024 Patient Name: James Lambert MRN:  [id number] DOB:  [birth year]  Interval Hx/subjective   - No new symptoms - No change in spasms  Vitals   Vitals:   06/27/24 2301 06/28/24 0304 06/28/24 0502 06/28/24 0801  BP: (!) 161/108 (!) 128/90 (!) 131/92 (!) 148/109  Pulse: 69 63 76 72  Resp: 20 16 16 20   Temp: (!) 97.4 F (36.3 C) 98.1 F (36.7 C)  97.9 F (36.6 C)  TempSrc:      SpO2: 100% 100% 95% 99%  Weight:      Height:         Body mass index is 29.66 kg/m.  Physical Exam   Constitutional: Comfortable in bed Psych: Affect calm and cooperative  Breathing comfortably   Neurologic Examination   Oriented to 58, month, year, situation Poor short term memory (reported no spasms during EEG but on review of EEG these were captured) Right spastic hemiparesis stable from yesterday    Medications  Current Facility-Administered Medications:     stroke: early stages of recovery book, , Does not apply, Once, Mansy, Jan A, MD   0.9 %  sodium chloride  infusion, , Intravenous, Continuous, Mansy, Jan A, MD, Last Rate: 100 mL/hr at 06/28/24 0310, Infusion Verify at 06/28/24 0310   acetaminophen  (TYLENOL ) tablet 650 mg, 650 mg, Oral, Q6H PRN, 650 mg at 06/27/24 2251 **OR** acetaminophen  (TYLENOL ) suppository 650 mg, 650 mg, Rectal, Q6H PRN, Mansy, Jan A, MD   albuterol  (PROVENTIL ) (2.5 MG/3ML) 0.083% nebulizer solution 3 mL, 3 mL, Nebulization, Q6H PRN, Mansy, Jan A, MD   amLODipine  (NORVASC ) tablet 10 mg, 10 mg, Oral, Daily, Mansy, Jan A, MD   amoxicillin -clavulanate (AUGMENTIN ) 875-125 MG per tablet 1 tablet, 1 tablet, Oral, BID, Mansy, Jan A, MD, 1 tablet at 06/27/24 2319   aspirin  EC tablet 81 mg, 81 mg, Oral, Daily, Mansy, Jan A, MD, 81 mg at 06/27/24 2319   atorvastatin  (LIPITOR ) tablet 80 mg, 80 mg, Oral, Daily, Mansy, Jan A, MD   clopidogrel  (PLAVIX ) tablet 75 mg, 75 mg, Oral, Daily, Mansy, Jan A, MD, 75 mg at  06/27/24 2319   cyanocobalamin  (VITAMIN B12) tablet 1,000 mcg, 1,000 mcg, Oral, Daily, Dnaiel Voller L, MD   cyclobenzaprine  (FLEXERIL ) tablet 5 mg, 5 mg, Oral, TID PRN, Mansy, Jan A, MD, 5 mg at 06/27/24 2250   enoxaparin  (LOVENOX ) injection 40 mg, 40 mg, Subcutaneous, Q24H, Mansy, Jan A, MD, 40 mg at 06/27/24 2319   hydrochlorothiazide  (HYDRODIURIL ) tablet 12.5 mg, 12.5 mg, Oral, Daily, Meegan, Eryn, RPH   irbesartan  (AVAPRO ) tablet 300 mg, 300 mg, Oral, Daily, Meegan, Eryn, RPH   LORazepam  (ATIVAN ) injection 1 mg, 1 mg, Intravenous, Q1H PRN, Mansy, Jan A, MD   magnesium  hydroxide (MILK OF MAGNESIA) suspension 30 mL, 30 mL, Oral, Daily PRN, Mansy, Jan A, MD   metoprolol  succinate (TOPROL -XL) 24 hr tablet 50 mg, 50 mg, Oral, Daily, Mansy, Jan A, MD   morphine  (PF) 2 MG/ML injection 2 mg, 2 mg, Intravenous, Q4H PRN, Mansy, Jan A, MD   multivitamin with minerals tablet 1 tablet, 1 tablet, Oral, Daily, Mansy, Jan A, MD   nicotine  (NICODERM CQ  - dosed in mg/24 hours) patch 21 mg, 21 mg, Transdermal, Daily, Mansy, Jan A, MD, 21 mg at 06/28/24 0021   ondansetron  (ZOFRAN ) tablet 4 mg, 4 mg, Oral, Q6H PRN **OR** ondansetron  (ZOFRAN ) injection 4 mg, 4 mg, Intravenous, Q6H PRN, Mansy, Jan  A, MD   pantoprazole  (PROTONIX ) EC tablet 40 mg, 40 mg, Oral, QHS, Mansy, Jan A, MD   thiamine  (VITAMIN B1) tablet 100 mg, 100 mg, Oral, Daily, Mansy, Jan A, MD   traZODone  (DESYREL ) tablet 25 mg, 25 mg, Oral, QHS PRN, Mansy, Jan A, MD  Labs and Diagnostic Imaging   CBC:  Recent Labs  Lab 06/27/24 1728 06/27/24 2030 06/28/24 0423  WBC 5.1 7.2 6.5  NEUTROABS 2.3 3.5  --   HGB 12.3* 12.3* 11.2*  HCT 38.5* 36.8* 34.3*  MCV 96.0 94.1 93.7  PLT 305 421* 388    Basic Metabolic Panel:  Lab Results  Component Value Date   NA 137 06/28/2024   K 3.8 06/28/2024   CO2 26 06/28/2024   GLUCOSE 123 (H) 06/28/2024   BUN 18 06/28/2024   CREATININE 1.16 06/28/2024   CALCIUM  8.8 (L) 06/28/2024   GFRNONAA >60  06/28/2024   GFRAA >60 06/04/2020   Lipid Panel:  Lab Results  Component Value Date   LDLCALC 42 06/28/2024   HgbA1c:  Lab Results  Component Value Date   HGBA1C 5.6 06/27/2024   Urine Drug Screen:     Component Value Date/Time   LABOPIA NONE DETECTED 06/27/2024 1728   LABOPIA NONE DETECTED 05/11/2022 0405   COCAINSCRNUR NONE DETECTED 06/27/2024 1728   LABBENZ NONE DETECTED 06/27/2024 1728   LABBENZ NONE DETECTED 05/11/2022 0405   AMPHETMU NONE DETECTED 06/27/2024 1728   AMPHETMU NONE DETECTED 05/11/2022 0405   THCU POSITIVE (A) 06/27/2024 1728   THCU POSITIVE (A) 05/11/2022 0405   LABBARB NONE DETECTED 06/27/2024 1728   LABBARB NONE DETECTED 05/11/2022 0405    Alcohol Level     Component Value Date/Time   ETH <15 06/27/2024 1728   INR  Lab Results  Component Value Date   INR 1.1 06/27/2024   APTT  Lab Results  Component Value Date   APTT 28 06/27/2024   AED levels: No results found for: PHENYTOIN, ZONISAMIDE, LAMOTRIGINE, LEVETIRACETA   CT Head without contrast(Personally reviewed): 1. Chronic encephalomalacia changes within the left frontal and parietal lobes and left cerebellar hemisphere with chronic lacunar infarcts within the thalami bilaterally. No apparent acute process. 2. ASPECTS is 10.     CT angio Head and Neck with contrast(Personally reviewed): 1. Moderate to severe stenosis of the cervical segment of the right internal carotid artery, proximally 70%. 2. Moderate to severe stenoses of the cavernous and supraclinoid segments of both internal carotid arteries. 3. 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 4. Status post interval left Internal carotid endarterectomy with restoration of blood flow within the distal cervical segment.   MRI Brain(Personally reviewed): no acute  intracranial process on limited study   rEEG:   This study is within normal limits. No seizures or epileptiform discharges were seen throughout the recording.   Events were recorded during this EEG at 1336 and 1354 during which patient reported right leg cramping without concomitant EEG change. These events were most likely NOT epileptic. However, focal motor seizure may not be seen on scalp EEG. Therefore, clinical correlation is recommended  Assessment   DEUNTA BENEKE is a 58 y.o. male with R arm and leg spasms due to prior stroke, poor short term memory  Recommendations   # Possible TIA vs hypoperfusion through critical stenosis - ECHO  - zio patch on  discharge  - A1c and LDL meeting goals - 90 days of DAPT due to severe intracranial atherosclerosis (Aspirin  and Plavix ), followed by Plavix  monotherapy. If patient cannot afford plavix , should stay on baby aspirin   # Right leg cramping, focal seizure vs. More likely post-stroke spacticity - EEG completed, negative - Tizanidine  2 mg nightly with additional 2 mg PRN (discontinued flexeril )  - PT/OT  # Smoking: - Counseled on the importance of quitting smoking to reduce risk of stroke. Discussed that nicotine  withdrawal symptoms peak in the first three days of smoking cessation and subside over the next three to four weeks. Discussed options including counseling, pharmacological options including Nicotine  patch, Chantix, Wellbutrin. We discussed quit date. I also provided the patient a handout with the number for [place]  quitline . Patient will also reach out to PCP on followup. - Nicotine  gum PRN - Additional counseled on association of marijuana use with stroke  # Alcohol use history - Low normal B12, start 1000 mcg daily for goal B12 > 1000  - Reports last drink 2 weeks ago, trying to quit entirely, encouraged on this - Agree with empiric thiamine  supplementation  Neurology will follow up ECHO otherwise will sign off at  this time, please reach out with any questions/concern. Discussed with primary team with secure chat. ______________________________________________________________________  Lola Jernigan MD-PhD [facility] [phone] [facility] coverage for [facility] is from 8 AM to 4 AM in-house and 4 PM to 8 PM by telephone/video. 8 PM to 8 AM emergent questions or overnight urgent questions should be addressed to Teleneurology On-call or Jolynn Pack neurohospitalist; contact information can be found on AMION

## 2024-06-28 NOTE — Plan of Care (Signed)
  Problem: Education: Goal: Knowledge of disease or condition will improve Outcome: Progressing Goal: Knowledge of secondary prevention will improve (MUST DOCUMENT ALL) Outcome: Progressing Goal: Knowledge of patient specific risk factors will improve (DELETE if not current risk factor) Outcome: Progressing   Problem: Ischemic Stroke/TIA Tissue Perfusion: Goal: Complications of ischemic stroke/TIA will be minimized Outcome: Progressing   Problem: Coping: Goal: Will verbalize positive feelings about self Outcome: Progressing Goal: Will identify appropriate support needs Outcome: Progressing   Problem: Self-Care: Goal: Ability to participate in self-care as condition permits will improve Outcome: Progressing Goal: Verbalization of feelings and concerns over difficulty with self-care will improve Outcome: Progressing Goal: Ability to communicate needs accurately will improve Outcome: Progressing   Problem: Nutrition: Goal: Risk of aspiration will decrease Outcome: Progressing Goal: Dietary intake will improve Outcome: Progressing   Problem: Education: Goal: Knowledge of General Education information will improve Description: Including pain rating scale, medication(s)/side effects and non-pharmacologic comfort measures Outcome: Progressing   Problem: Health Behavior/Discharge Planning: Goal: Ability to manage health-related needs will improve Outcome: Progressing   Problem: Clinical Measurements: Goal: Ability to maintain clinical measurements within normal limits will improve Outcome: Progressing Goal: Will remain free from infection Outcome: Progressing Goal: Diagnostic test results will improve Outcome: Progressing Goal: Respiratory complications will improve Outcome: Progressing Goal: Cardiovascular complication will be avoided Outcome: Progressing   Problem: Activity: Goal: Risk for activity intolerance will decrease Outcome: Progressing   Problem:  Nutrition: Goal: Adequate nutrition will be maintained Outcome: Progressing   Problem: Coping: Goal: Level of anxiety will decrease Outcome: Progressing   Problem: Elimination: Goal: Will not experience complications related to bowel motility Outcome: Progressing Goal: Will not experience complications related to urinary retention Outcome: Progressing

## 2024-06-28 NOTE — Procedures (Signed)
 Patient Name: James Lambert  MRN: [id number]  Epilepsy Attending: Arlin MALVA Krebs  Referring Physician/Provider: [name], MD  Date: 06/28/2024 Duration: 26.58 mins  Patient history: 58 y.o. male presenting with possible syncope, witnessed right-sided weakness/aphasia. EEG to evaluate for seizure  Level of alertness: Awake, asleep  AEDs during EEG study: None  Technical aspects: This EEG study was done with scalp electrodes positioned according to the 10-20 International system of electrode placement. Electrical activity was reviewed with band pass filter of 1-70Hz , sensitivity of 7 uV/mm, display speed of 23mm/sec with a 60Hz  notched filter applied as appropriate. EEG data were recorded continuously and digitally stored.  Video monitoring was available and reviewed as appropriate.  Description: The posterior dominant rhythm consists of 8-9 Hz activity of moderate voltage (25-35 uV) seen predominantly in posterior head regions, symmetric and reactive to eye opening and eye closing. Sleep was characterized by vertex waves, sleep spindles (12 to 14 Hz), maximal frontocentral region.    Events were recorded during this EEG at 1336 and 1354 during which patient reported right leg cramping. Concomitant EEG before, during and after the event showed normal posterior dominant rhythm, did not show any EEG change to suggest seizure.  Hyperventilation and photic stimulation were not performed.     IMPRESSION: This study is within normal limits. No seizures or epileptiform discharges were seen throughout the recording.  Events were recorded during this EEG at 1336 and 1354 during which patient reported right leg cramping without concomitant EEG change. These events were most likely NOT epileptic. However, focal motor seizure may not be seen on scalp EEG. Therefore, clinical correlation is recommended  Kayode Petion O Jerrad Mendibles

## 2024-06-28 NOTE — Progress Notes (Signed)
 Introduced patient to role of Statistician. Intake questions completed.  Patient lives alone, rents. Is not currently employed, but does receive SSDI for approx. 900/mo. He expresses difficulty with transportation and food insecurity. Currently unable to receive food stamps because of a Class G felony on his record---will look into possibility of expunction.   Will set up with [employer] deliveries. Provided with Medicaid Transportation number. Referral to [employer] completed.  Patient does endorse alcohol use, although he states he is cutting back and positive THC use. Patient would like to quit both.   Currently has an established PCP.   Encouraged to call with questions or concerns.

## 2024-06-28 NOTE — Discharge Instructions (Addendum)
 Your nurse navigator, Mariem Skolnick, can be reached at [phone].  Medicaid Transportation [phone]. Please call 3-4 days in advance to schedule pick up/drop off for medical appointments.   Dear James Lambert,   Congratulations for your interest in quitting smoking!  Find a program that suits you best: when you want to quit, how you need support, where you live, and how you like to learn.    If you're ready to get started TODAY, consider scheduling a visit through [email or web address].  Appointments are available from 8am to 8pm, Monday to Friday.   Most health insurance plans will cover some level of tobacco cessation visits and medications.    Additional Resources: [employer] are also available to help you quit & provide the support you'll need. Many programs are available in both [place] and Spanish and have a long history of successfully helping people get off and stay off tobacco.    Quit Smoking Apps:  quitSTART at [employer].de QuitGuide?at ForgetParking.dk Online education and resources: Smokefree  at [employer].gov Free Telephone Coaching: QuitNow,  Call 1-800-QUIT-NOW ((267)883-0634) or Text- Ready to 681-887-7484 *Quitline [place] has teamed up with Medicaid to offer a free 14 week program    Vaping- Want to Quit? Free 24/7 support. Call [facility]  [place], [place], [place], [place], [place]  [facility]

## 2024-06-28 NOTE — TOC Transition Note (Signed)
 Transition of Care [facility]) - Discharge Note   Patient Details  Name: James Lambert MRN: [id number] Date of Birth: [birth year]  [facility] of [facility] CM/SW Contact:  Dalia GORMAN Fuse, RN Phone Number: 06/28/2024, 2:59 PM   Clinical Narrative:    Patient is medically clear for discharge to home. The patient doesn't have a PCP and currently doesn't have a payor. [facility] for [facility] was added to the patient's AVS. No other TOC needs         Patient Goals and CMS Choice            Discharge Placement                       Discharge Plan and Services Additional resources added to the After Visit Summary for                                       Social Drivers of Health (SDOH) Interventions SDOH Screenings   Food Insecurity: Food Insecurity Present (06/28/2024)  Housing: Low Risk  (06/28/2024)  Transportation Needs: No Transportation Needs (06/28/2024)  Utilities: Not At Risk (06/28/2024)  Alcohol Screen: Low Risk  (09/10/2022)  Depression (PHQ2-9): Medium Risk (04/15/2023)  Financial Resource Strain: High Risk (03/09/2023)  Physical Activity: Insufficiently Active (09/10/2022)  Social Connections: Moderately Isolated (06/28/2024)  Stress: Stress Concern Present (08/18/2023)  Tobacco Use: High Risk (06/27/2024)     Readmission Risk Interventions     No data to display

## 2024-06-28 NOTE — Progress Notes (Signed)
 PROGRESS NOTE    James Lambert  FMW:[id number]  DOB: 03/07/1966  DOA: 06/27/2024 PCP: [name], NP Outpatient Specialists:   Hospital course:  58 y.o. male with medical history significant for asthma, COPD, CVA with mild residual right-sided weakness, GERD, dyslipidemia, hypertension, seizure and OSA, who presented to the emergency room with acute onset of lightheadedness. In the ER the patient was witnessed to have an episode of slurred speech with right arm jerking which resolved spontaneously.   Subjective:  Patient states he wants to make sure that he has symptoms get evaluated.  His main concern seems to be cramping of his right hand.  Notes that he has had weakness since his stroke but is only really started cramping lately.  Objective: Vitals:   06/28/24 0304 06/28/24 0502 06/28/24 0801 06/28/24 1138  BP: (!) 128/90 (!) 131/92 (!) 148/109 (!) 140/101  Pulse: 63 76 72 69  Resp: 16 16 20 20   Temp: 98.1 F (36.7 C)  97.9 F (36.6 C) 98 F (36.7 C)  TempSrc:      SpO2: 100% 95% 99% 97%  Weight:      Height:        Intake/Output Summary (Last 24 hours) at 06/28/2024 1523 Last data filed at 06/28/2024 1423 Gross per 24 hour  Intake 517.31 ml  Output 2050 ml  Net -1532.69 ml   Filed Weights   06/27/24 1814  Weight: 104.8 kg     Exam:  General: Patient sitting up in bed in NAD working his right arm Eyes: sclera anicteric, conjuctiva mild injection bilaterally CVS: S1-S2, regular  Respiratory:  decreased air entry bilaterally secondary to decreased inspiratory effort, rales at bases  GI: NABS, soft, NT  LE: Warm and well-perfused Neuro: A/O x 3, right hemiplegia 4-/5 in upper extremity Psych: patient is logical and coherent, judgement and insight appear normal, mood and affect appropriate to situation.  Data Reviewed:  Basic Metabolic Panel: Recent Labs  Lab 06/27/24 1728 06/27/24 2030 06/28/24 0423  NA 138 137 137  K 4.0 4.0 3.8  CL 108  101 103  CO2 22 24 26   GLUCOSE 76 92 123*  BUN 19 20 18   CREATININE 1.19 1.27* 1.16  CALCIUM  8.1* 9.4 8.8*    CBC: Recent Labs  Lab 06/27/24 1728 06/27/24 2030 06/28/24 0423  WBC 5.1 7.2 6.5  NEUTROABS 2.3 3.5  --   HGB 12.3* 12.3* 11.2*  HCT 38.5* 36.8* 34.3*  MCV 96.0 94.1 93.7  PLT 305 421* 388     Scheduled Meds:  amLODipine   10 mg Oral Daily   amoxicillin -clavulanate  1 tablet Oral BID   aspirin  EC  81 mg Oral Daily   atorvastatin   80 mg Oral Daily   clopidogrel   75 mg Oral Daily   vitamin B-12  1,000 mcg Oral Daily   enoxaparin  (LOVENOX ) injection  40 mg Subcutaneous Q24H   hydrochlorothiazide   12.5 mg Oral Daily   irbesartan   300 mg Oral Daily   metoprolol  succinate  50 mg Oral Daily   multivitamin with minerals  1 tablet Oral Daily   nicotine   21 mg Transdermal Daily   pantoprazole   40 mg Oral QHS   thiamine   100 mg Oral Daily   Continuous Infusions:  sodium chloride  100 mL/hr at 06/28/24 1023     Assessment & Plan:   TIA R/o seizure MRI without acute stroke but notable for frontal encephalomalacia Patient is on aspirin , Plavix  and statin Echo is ordered and  pending EEG is ordered and pending Neurology Dr. Jerrie will consult today.  Right hand spasms Doubt that these are seizures but need to rule out partial seizure If they are hand spasms, unclear etiology, potassium and magnesium  are WNL Will supplement potassium to greater than 4.0 Patient may benefit from hand exercises PT consult is ordered and pending  HTN BP under good control  GERD without esophagitis Continue PPI   DVT prophylaxis: Lovenox  Code Status: Full Family Communication: None today     Studies: EEG adult Result Date: 06/28/2024 Shelton Arlin KIDD, MD     06/28/2024  2:34 PM Patient Name: James Lambert MRN: [id number] Epilepsy Attending: Arlin KIDD Shelton Referring Physician/Provider: [name], MD Date: 06/28/2024 Duration: 26.58 mins Patient history: 58 y.o.  male presenting with possible syncope, witnessed right-sided weakness/aphasia. EEG to evaluate for seizure Level of alertness: Awake, asleep AEDs during EEG study: None Technical aspects: This EEG study was done with scalp electrodes positioned according to the 10-20 International system of electrode placement. Electrical activity was reviewed with band pass filter of 1-70Hz , sensitivity of 7 uV/mm, display speed of 71mm/sec with a 60Hz  notched filter applied as appropriate. EEG data were recorded continuously and digitally stored.  Video monitoring was available and reviewed as appropriate. Description: The posterior dominant rhythm consists of 8-9 Hz activity of moderate voltage (25-35 uV) seen predominantly in posterior head regions, symmetric and reactive to eye opening and eye closing. Sleep was characterized by vertex waves, sleep spindles (12 to 14 Hz), maximal frontocentral region.  Events were recorded during this EEG at 1336 and 1354 during which patient reported right leg cramping. Concomitant EEG before, during and after the event showed normal posterior dominant rhythm, did not show any EEG change to suggest seizure. Hyperventilation and photic stimulation were not performed.   IMPRESSION: This study is within normal limits. No seizures or epileptiform discharges were seen throughout the recording. Events were recorded during this EEG at 1336 and 1354 during which patient reported right leg cramping without concomitant EEG change. These events were most likely NOT epileptic. However, focal motor seizure may not be seen on scalp EEG. Therefore, clinical correlation is recommended [name]   MR BRAIN WO CONTRAST Result Date: 06/27/2024 CLINICAL DATA:  Neuro deficit, concern for stroke, dizziness. Blurry vision, word finding difficulty, and attention to right side, right-sided facial droop. EXAM: MRI HEAD WITHOUT CONTRAST TECHNIQUE: Multiplanar, multiecho pulse sequences of the brain and  surrounding structures were obtained without intravenous contrast. COMPARISON:  CT head and CTA head and neck earlier same day. FINDINGS: Limited examination. Patient unable to lie flat during examination due to involuntary right leg movement. Examination is limited due to motion artifact. Axial and coronal DWI and ADC images were obtained as well as axial T2 FLAIR images. Within these limitations there is no evidence of acute infarct. There is FLAIR hyperintensity noted within the left frontoparietal lobes likely reflecting encephalomalacia in the setting of prior infarct. IMPRESSION: Limited examination as above. No findings to suggest acute infarct. FLAIR hyperintensity in the left frontoparietal lobes suggestive of encephalomalacia in the setting of prior infarct. Electronically Signed   By: Donnice Mania M.D.   On: 06/27/2024 19:18   CT ANGIO HEAD NECK W WO CM Result Date: 06/27/2024 CLINICAL DATA:  Stroke/TIA, determine embolic source EXAM: CT ANGIOGRAPHY HEAD AND NECK WITH CONTRAST TECHNIQUE: Multidetector CT imaging of the head and neck was performed using the standard protocol during bolus administration of intravenous contrast. Multiplanar  CT image reconstructions and MIPs were obtained to evaluate the vascular anatomy. Carotid stenosis measurements (when applicable) are obtained utilizing NASCET criteria, using the distal internal carotid diameter as the denominator. RADIATION DOSE REDUCTION: This exam was performed according to the departmental dose-optimization program which includes automated exposure control, adjustment of the mA and/or kV according to patient size and/or use of iterative reconstruction technique. CONTRAST:  75mL OMNIPAQUE  IOHEXOL  350 MG/ML SOLN COMPARISON:  CT the head dated June 27, 2024. CT angiogram of the head and neck dated May 10, 2022. FINDINGS: CTA NECK FINDINGS Aortic arch: Mild calcific plaque.  No stenosis. Right carotid system: The proximal carotid artery is tortuous.  There is moderate calcific plaque within the carotid bulb with less than 10% luminal stenosis. There is moderate to severe stenosis of the proximal internal carotid artery, approximately 70% by NASCET criteria. Left carotid system: The common carotid artery is normal in caliber. There is moderate calcific plaque present within the proximal internal carotid artery, with less than 50% luminal stenosis. There has been interval left carotid endarterectomy with restitution of blood flow within the distal cervical segment, which was occluded on the prior exam. Vertebral arteries: The left vertebral artery is dominant. The right vertebral artery is relatively hypoplastic. There is no significant stenosis of either vertebral artery in the neck. Skeleton: Multilevel degenerative disc disease. Other neck: Negative. Upper chest: The lung apices are clear. Review of the MIP images confirms the above findings CTA HEAD FINDINGS Anterior circulation: There is moderate to severe stenosis of the cranial and cavernous segments of the internal carotid arteries bilaterally. There is extensive calcific atheromatous disease with probable high-grade stenoses of the ophthalmic segments and communicating segments bilaterally, particularly on the right. Estimated luminal stenosis is 70-80%. There is no clear large vessel occlusion. The anterior and middle cerebral arteries demonstrate no flow-limiting stenosis or aneurysm. Posterior circulation: There is extensive calcific plaque within the V4 segments of the vertebral arteries bilaterally, with moderate to severe stenosis of the right V4 segment. There is moderate to severe multifocal stenosis of the basilar artery, similar to the prior exam. There are moderate stenoses within the posterior cerebral arteries bilaterally. Venous sinuses: Poorly opacified.  No clear thrombosis. Anatomic variants: None. Review of the MIP images confirms the above findings IMPRESSION: 1. Moderate to severe  stenosis of the cervical segment of the right internal carotid artery, proximally 70%. 2. Moderate to severe stenoses of the cavernous and supraclinoid segments of both internal carotid arteries. 3. 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 4. Status post interval left Internal carotid endarterectomy with restoration of blood flow within the distal cervical segment. These results were communicated to Dr. Jerrie at 4:30 pm on 06/27/2024 by text page via the [facility] messaging system. Electronically Signed   By: Evalene Coho M.D.   On: 06/27/2024 16:50   CT HEAD CODE STROKE WO CONTRAST Result Date: 06/27/2024 CLINICAL DATA:  Code stroke. Right-sided weakness and leftward gaze. EXAM: CT HEAD WITHOUT CONTRAST TECHNIQUE: Contiguous axial images were obtained from the base of the skull through the vertex without intravenous contrast. RADIATION DOSE REDUCTION: This exam was performed according to the departmental dose-optimization program which includes automated exposure control, adjustment of the mA and/or kV according to patient size and/or use of iterative reconstruction technique. COMPARISON:  CT the head dated May 29, 2024. FINDINGS: Brain:  There is age related cerebral volume loss. Chronic encephalomalacia changes are noted within the left posterior frontal and parietal lobes. There is a separate area of encephalomalacia present anteriorly within the left frontal lobe. There are encephalomalacia changes also again demonstrated within the left cerebellar hemisphere. There are chronic lacunar infarcts within the thalami bilaterally. There is no evidence of hemorrhage, mass, acute cortical infarct or hydrocephalus. Vascular: Extensive vascular calcifications within the carotid siphons and vertebral arteries. Skull: Intact and unremarkable. Sinuses/Orbits: The  visualized paranasal sinuses and mastoid air cells are clear. The orbits are unremarkable. Other: None. IMPRESSION: 1. Chronic encephalomalacia changes within the left frontal and parietal lobes and left cerebellar hemisphere with chronic lacunar infarcts within the thalami bilaterally. No apparent acute process. 2. ASPECTS is 10. 3. These results were communicated to Dr. Jerrie at 3:58 pm on 06/27/2024 by text page via the [facility] messaging system. Electronically Signed   By: Evalene Coho M.D.   On: 06/27/2024 16:00    Principal Problem:   TIA (transient ischemic attack) Active Problems:   Seizure (HCC)   GERD without esophagitis   Dyslipidemia   Essential hypertension     Hannie Shoe Vangie Pike, [facility]  If 7PM-7AM, please contact night-coverage [email or web address]   LOS: 0 days

## 2024-06-29 ENCOUNTER — Telehealth: Payer: Self-pay

## 2024-06-29 ENCOUNTER — Observation Stay: Admit: 2024-06-29 | Discharge: 2024-06-29 | Disposition: A | Discharge status: Home / Self Care | Attending: Neurology

## 2024-06-29 ENCOUNTER — Observation Stay: Admit: 2024-06-29

## 2024-06-29 DIAGNOSIS — G459 Transient cerebral ischemic attack, unspecified: Secondary | ICD-10-CM

## 2024-06-29 LAB — ECHOCARDIOGRAM COMPLETE
AR max vel: 2.9 cm2
AV Area VTI: 3.38 cm2
AV Area mean vel: 3.02 cm2
AV Mean grad: 1 mmHg
AV Peak grad: 2.8 mmHg
Ao pk vel: 0.83 m/s
Area-P 1/2: 2.54 cm2
Height: 74 in
S' Lateral: 2.4 cm
Weight: 3695.97 [oz_av]

## 2024-06-29 MED ORDER — OXYCODONE HCL 5 MG PO TABS: 5.0000 mg | ORAL_TABLET | Freq: Four times a day (QID) | ORAL | 0 refills | Status: DC | PRN

## 2024-06-29 MED ORDER — OXYCODONE HCL 5 MG PO TABS
5.0000 mg | ORAL_TABLET | Freq: Four times a day (QID) | ORAL | Status: DC | PRN
  Administered 2024-06-29: 5 mg via ORAL
  Filled 2024-06-29: qty 1

## 2024-06-29 MED ORDER — CLOPIDOGREL BISULFATE 75 MG PO TABS: 75.0000 mg | ORAL_TABLET | Freq: Every day | ORAL | 0 refills | Status: DC

## 2024-06-29 MED ORDER — ALUM & MAG HYDROXIDE-SIMETH 200-200-20 MG/5ML PO SUSP
30.0000 mL | ORAL | Status: DC | PRN
  Administered 2024-06-29: 30 mL via ORAL
  Filled 2024-06-29: qty 30

## 2024-06-29 MED ORDER — ASPIRIN 81 MG PO TBEC: 81.0000 mg | DELAYED_RELEASE_TABLET | Freq: Every day | ORAL | 0 refills | Status: AC

## 2024-06-29 MED ORDER — TIZANIDINE HCL 2 MG PO TABS: ORAL_TABLET | ORAL | 0 refills | Status: AC

## 2024-06-29 MED ORDER — CYANOCOBALAMIN 1000 MCG PO TABS: 1000.0000 ug | ORAL_TABLET | Freq: Every day | ORAL | 0 refills | Status: AC

## 2024-06-29 NOTE — Progress Notes (Signed)
*  PRELIMINARY RESULTS* Echocardiogram 2D Echocardiogram has been performed.  James Lambert, Sens 06/29/2024, 9:05 AM

## 2024-06-29 NOTE — Discharge Summary (Signed)
 Physician Discharge Summary   Patient: James Lambert MRN: [id number]  DOB: 05-Jul-1966   Admit:     Date of Admission: 06/27/2024 Admitted from: home   Discharge: Date of discharge: 06/29/24 Disposition: Home Condition at discharge: good  CODE STATUS: FULL CODE     Discharge Physician: James Blunt, DO [facility]     PCP: James Melanie DASEN, NP  Recommendations for Outpatient Follow-up:  Follow up with PCP James Melanie DASEN, NP in 1-2 weeks Please obtain labs/tests: follow up on Zio monitor with cardiology.  Please follow up on the following: Echo results - no clinical CHF but reduced EF and LV hypokinesis, would follow w/ cardiology outpatient. Follow w/ dermatology for hidradnitis  PCP AND OTHER OUTPATIENT PROVIDERS: SEE BELOW FOR SPECIFIC DISCHARGE INSTRUCTIONS PRINTED FOR PATIENT IN ADDITION TO GENERIC AVS PATIENT INFO     Discharge Instructions     Ambulatory Referral for Lung Cancer Scre   Complete by: As directed    Diet - low sodium heart healthy   Complete by: As directed    Discharge instructions   Complete by: As directed    Continue medications as ordered - see list. Includes medications to prevent stroke and to help muscle spasm.  Mildly abnormal heart ultrasound - some heart stress likely from long term smoking and high blood pressure, but may need further testing with cardiology.   Increase activity slowly   Complete by: As directed          Discharge Diagnoses: Principal Problem:   TIA (transient ischemic attack) Active Problems:   Seizure (HCC)   GERD without esophagitis   Dyslipidemia   Essential hypertension        Hospital course / significant events:   HPI: 58 y.o. male with medical history significant for asthma, COPD, CVA with mild residual right-sided weakness, GERD, dyslipidemia, hypertension, seizure and OSA, who presented to the emergency room with acute onset of lightheadedness.   07/21: In the ER the  patient was witnessed to have an episode of slurred speech with right arm jerking which resolved spontaneously. Admitted to hospitalist  07/22: EEG negative. Echo pending. Neuro recs see A/P and s/o 07/23: Echo LVEF 45-50, global hypokinesis, Grade 1 diast df, no valvular abn or shunt. Cardiology to send Zio monitor and he will need outpatient f/u. Did well w/ therapy, ok for outpatient f/u w/ PT.      Consultants:  Neurology  Procedures/Surgeries: none      ASSESSMENT & PLAN:   Possible TIA vs hypoperfusion through critical stenosis MRI without acute stroke but notable for frontal encephalomalacia  Echo LVEF 45-50, global hypokinesis, Grade 1 diast df, no valvular abn or shunt.  zio patch on discharge - cardiology will mail to him A1c and LDL meeting goals 90 days of DAPT due to severe intracranial atherosclerosis (Aspirin  and Plavix ), followed by Plavix  monotherapy. If patient cannot afford plavix , should stay on baby aspirin  Neuro s/o    Right leg cramping, R hand spasm, focal seizure vs. More likely post-stroke spacticity EEG completed, negative Tizanidine  2 mg nightly with additional 2 mg PRN (discontinued flexeril )  PT/OT  MRI brain (+)frontal encephalomalacia - likely clinically significant given (+)dementia screening and concern for mild cognitive impairment  Close outpatient follow up   HTN BP under good control  Abnormal echo without evidence of heart failure Echo LVEF 45-50, global hypokinesis, Grade 1 diast df, no valvular abn or shunt.  No ischemia on EKG and no CP/SOB,  no edema  Cardiology follow up outpatient  Already on beta blocker, ARB, ASA, Plavix , statin    GERD without esophagitis Continue PPI  Alcohol use history B12 deficiency start B12 1000 mcg daily for goal B12 > 1000  empiric thiamine  supplementation EtOH abstinence   Hidradenitis Augmentin  x 10 days (to complete 07/24) finish outpatient rx   Smoking: Counseled on the importance of  quitting smoking  Nicotine  gum PRN Additional counseled on association of marijuana use with stroke   Overweight / Class 1 obesity based on BMI: Body mass index is 29.66 kg/m.[name] Significantly low or high BMI is associated with higher medical risk.  Underweight - under 18  overweight - 25 to 29 obese - 30 or more Class 1 obesity: BMI of 30.0 to 34 Class 2 obesity: BMI of 35.0 to 39 Class 3 obesity: BMI of 40.0 to 49 Super Morbid Obesity: BMI 50-59 Super-super Morbid Obesity: BMI 60+ Healthy nutrition and physical activity advised as adjunct to other disease management and risk reduction treatments             Discharge Instructions  Allergies as of 06/29/2024   No Known Allergies      Medication List     STOP taking these medications    cyclobenzaprine  5 MG tablet Commonly known as: FLEXERIL    sulfamethoxazole -trimethoprim  800-160 MG tablet Commonly known as: BACTRIM  DS       TAKE these medications    acetaminophen  500 MG tablet Commonly known as: TYLENOL  Take 2 tablets (1,000 mg total) by mouth every 6 (six) hours as needed.   albuterol  108 (90 Base) MCG/ACT inhaler Commonly known as: VENTOLIN  HFA Inhale 1-2 puffs into the lungs every 6 (six) hours as needed for wheezing or shortness of breath.   amLODipine  10 MG tablet Commonly known as: NORVASC  Take 1 tablet (10 mg total) by mouth daily.   amoxicillin -clavulanate 875-125 MG tablet Commonly known as: AUGMENTIN  Take 1 tablet by mouth 2 (two) times daily for 10 days.   aspirin  EC 81 MG tablet Take 1 tablet (81 mg total) by mouth daily. Swallow whole. Start taking on: June 30, 2024   atorvastatin  80 MG tablet Commonly known as: LIPITOR  Take 1 tablet (80 mg total) by mouth daily.   CertaVite/Antioxidants Tabs Take 1 tablet by mouth daily.   chlorhexidine  4 % external liquid Commonly known as: Hibiclens  Apply topically daily as needed. To clean abscesses on bottom when present.   clopidogrel   75 MG tablet Commonly known as: PLAVIX  Take 1 tablet (75 mg total) by mouth daily.   cyanocobalamin  1000 MCG tablet Take 1 tablet (1,000 mcg total) by mouth daily. Start taking on: June 30, 2024   metoprolol  succinate 50 MG 24 hr tablet Commonly known as: TOPROL -XL Take 1 tablet (50 mg total) by mouth daily with or immediately following a meal.   mupirocin  ointment 2 % Commonly known as: BACTROBAN  Apply 1 Application topically 2 (two) times daily.   olmesartan -hydrochlorothiazide  40-12.5 MG tablet Commonly known as: BENICAR  HCT Take 1 tablet by mouth daily.   oxyCODONE  5 MG immediate release tablet Commonly known as: Oxy IR/ROXICODONE  Take 1 tablet (5 mg total) by mouth every 6 (six) hours as needed for severe pain (pain score 7-10) or moderate pain (pain score 4-6).   pantoprazole  40 MG tablet Commonly known as: PROTONIX  Take 1 tablet (40 mg total) by mouth at bedtime.   thiamine  100 MG tablet Commonly known as: Vitamin B-1 Take 1 tablet (100 mg total) by  mouth daily.   tiZANidine  2 MG tablet Commonly known as: ZANAFLEX  Take 1 tablet (2 mg total) by mouth at bedtime. May also take 1 tablet (2 mg total) daily as needed for muscle spasms.   umeclidinium-vilanterol 62.5-25 MCG/ACT Aepb Commonly known as: ANORO ELLIPTA  Inhale 1 puff into the lungs daily at 6 (six) AM.         Follow-up Information     Cannady, Melanie DASEN, NP Follow up.   Specialty: Nurse Practitioner Why: hospital follow up Contact information: [address] [place] [address] [phone]         [facility] Follow up.   Specialty: Rehabilitation Why: The facility will call to schedule the first PT/OT appointment. Contact information: [address] [place]  [address] [phone]        Perla Evalene PARAS, MD Follow up.   Specialty: Cardiology Why: review heart monitor results and echocardiogram Contact  information: [address] [place] [address] [phone]                 No Known Allergies   Subjective: pt reports knee pain but otherwise ok. Tolerating diet, no new weakness, no speech change,    Discharge Exam: BP 111/84 (BP Location: Right Arm)   Pulse 66   Temp 98.4 F (36.9 C)   Resp 16   Ht 6' 2 (1.88 m)   Wt 104.8 kg   SpO2 97%   BMI 29.66 kg/m  General: Pt is alert, awake, not in acute distress Cardiovascular: RRR, S1/S2 +, no rubs, no gallops Respiratory: CTA bilaterally, no wheezing, no rhonchi Abdominal: Soft, NT, ND, bowel sounds + Extremities: no edema, no cyanosis     The results of significant diagnostics from this hospitalization (including imaging, microbiology, ancillary and laboratory) are listed below for reference.     Microbiology: No results found for this or any previous visit (from the past 240 hours).   Labs: BNP (last 3 results) No results for input(s): BNP in the last 8760 hours. Basic Metabolic Panel: Recent Labs  Lab 06/27/24 1728 06/27/24 2030 06/28/24 0423  NA 138 137 137  K 4.0 4.0 3.8  CL 108 101 103  CO2 22 24 26   GLUCOSE 76 92 123*  BUN 19 20 18   CREATININE 1.19 1.27* 1.16  CALCIUM  8.1* 9.4 8.8*   Liver Function Tests: Recent Labs  Lab 06/27/24 1728 06/27/24 2030  AST 11* 12*  ALT 8 7  ALKPHOS 48 55  BILITOT 0.6 0.4  PROT 7.0 7.8  ALBUMIN 3.2* 3.6   No results for input(s): LIPASE, AMYLASE in the last 168 hours. No results for input(s): AMMONIA in the last 168 hours. CBC: Recent Labs  Lab 06/27/24 1728 06/27/24 2030 06/28/24 0423  WBC 5.1 7.2 6.5  NEUTROABS 2.3 3.5  --   HGB 12.3* 12.3* 11.2*  HCT 38.5* 36.8* 34.3*  MCV 96.0 94.1 93.7  PLT 305 421* 388   Cardiac Enzymes: Recent Labs  Lab 06/27/24 1728  CKTOTAL 139   BNP: Invalid input(s): POCBNP CBG: Recent Labs  Lab 06/27/24 1532  GLUCAP 84   D-Dimer No results for input(s): DDIMER in the  last 72 hours. Hgb A1c Recent Labs    06/27/24 2030  HGBA1C 5.6   Lipid Profile Recent Labs    06/28/24 0423  CHOL 75  HDL 22*  LDLCALC 42  TRIG 57  CHOLHDL 3.4   Thyroid function studies No  results for input(s): TSH, T4TOTAL, T3FREE, THYROIDAB in the last 72 hours.  Invalid input(s): FREET3 Anemia work up Recent Labs    06/27/24 2253  VITAMINB12 262   Urinalysis    Component Value Date/Time   COLORURINE YELLOW (A) 05/29/2024 1329   APPEARANCEUR CLEAR (A) 05/29/2024 1329   LABSPEC 1.020 05/29/2024 1329   PHURINE 5.0 05/29/2024 1329   GLUCOSEU NEGATIVE 05/29/2024 1329   HGBUR NEGATIVE 05/29/2024 1329   BILIRUBINUR NEGATIVE 05/29/2024 1329   KETONESUR NEGATIVE 05/29/2024 1329   PROTEINUR NEGATIVE 05/29/2024 1329   NITRITE NEGATIVE 05/29/2024 1329   LEUKOCYTESUR NEGATIVE 05/29/2024 1329   Sepsis Labs Recent Labs  Lab 06/27/24 1728 06/27/24 2030 06/28/24 0423  WBC 5.1 7.2 6.5   Microbiology No results found for this or any previous visit (from the past 240 hours). Imaging EEG adult Result Date: 06/28/2024 Shelton Arlin KIDD, MD     06/28/2024  2:34 PM Patient Name: MCKOY BHAKTA MRN: [id number] Epilepsy Attending: Arlin KIDD Shelton Referring Physician/Provider: [name], MD Date: 06/28/2024 Duration: 26.58 mins Patient history: 58 y.o. male presenting with possible syncope, witnessed right-sided weakness/aphasia. EEG to evaluate for seizure Level of alertness: Awake, asleep AEDs during EEG study: None Technical aspects: This EEG study was done with scalp electrodes positioned according to the 10-20 International system of electrode placement. Electrical activity was reviewed with band pass filter of 1-70Hz , sensitivity of 7 uV/mm, display speed of 51mm/sec with a 60Hz  notched filter applied as appropriate. EEG data were recorded continuously and digitally stored.  Video monitoring was available and reviewed as appropriate. Description: The posterior  dominant rhythm consists of 8-9 Hz activity of moderate voltage (25-35 uV) seen predominantly in posterior head regions, symmetric and reactive to eye opening and eye closing. Sleep was characterized by vertex waves, sleep spindles (12 to 14 Hz), maximal frontocentral region.  Events were recorded during this EEG at 1336 and 1354 during which patient reported right leg cramping. Concomitant EEG before, during and after the event showed normal posterior dominant rhythm, did not show any EEG change to suggest seizure. Hyperventilation and photic stimulation were not performed.   IMPRESSION: This study is within normal limits. No seizures or epileptiform discharges were seen throughout the recording. Events were recorded during this EEG at 1336 and 1354 during which patient reported right leg cramping without concomitant EEG change. These events were most likely NOT epileptic. However, focal motor seizure may not be seen on scalp EEG. Therefore, clinical correlation is recommended [name]   MR BRAIN WO CONTRAST Result Date: 06/27/2024 CLINICAL DATA:  Neuro deficit, concern for stroke, dizziness. Blurry vision, word finding difficulty, and attention to right side, right-sided facial droop. EXAM: MRI HEAD WITHOUT CONTRAST TECHNIQUE: Multiplanar, multiecho pulse sequences of the brain and surrounding structures were obtained without intravenous contrast. COMPARISON:  CT head and CTA head and neck earlier same day. FINDINGS: Limited examination. Patient unable to lie flat during examination due to involuntary right leg movement. Examination is limited due to motion artifact. Axial and coronal DWI and ADC images were obtained as well as axial T2 FLAIR images. Within these limitations there is no evidence of acute infarct. There is FLAIR hyperintensity noted within the left frontoparietal lobes likely reflecting encephalomalacia in the setting of prior infarct. IMPRESSION: Limited examination as above. No  findings to suggest acute infarct. FLAIR hyperintensity in the left frontoparietal lobes suggestive of encephalomalacia in the setting of prior infarct. Electronically Signed   By: Donnice Mania  M.D.   On: 06/27/2024 19:18   CT ANGIO HEAD NECK W WO CM Result Date: 06/27/2024 CLINICAL DATA:  Stroke/TIA, determine embolic source EXAM: CT ANGIOGRAPHY HEAD AND NECK WITH CONTRAST TECHNIQUE: Multidetector CT imaging of the head and neck was performed using the standard protocol during bolus administration of intravenous contrast. Multiplanar CT image reconstructions and MIPs were obtained to evaluate the vascular anatomy. Carotid stenosis measurements (when applicable) are obtained utilizing NASCET criteria, using the distal internal carotid diameter as the denominator. RADIATION DOSE REDUCTION: This exam was performed according to the departmental dose-optimization program which includes automated exposure control, adjustment of the mA and/or kV according to patient size and/or use of iterative reconstruction technique. CONTRAST:  75mL OMNIPAQUE  IOHEXOL  350 MG/ML SOLN COMPARISON:  CT the head dated June 27, 2024. CT angiogram of the head and neck dated May 10, 2022. FINDINGS: CTA NECK FINDINGS Aortic arch: Mild calcific plaque.  No stenosis. Right carotid system: The proximal carotid artery is tortuous. There is moderate calcific plaque within the carotid bulb with less than 10% luminal stenosis. There is moderate to severe stenosis of the proximal internal carotid artery, approximately 70% by NASCET criteria. Left carotid system: The common carotid artery is normal in caliber. There is moderate calcific plaque present within the proximal internal carotid artery, with less than 50% luminal stenosis. There has been interval left carotid endarterectomy with restitution of blood flow within the distal cervical segment, which was occluded on the prior exam. Vertebral arteries: The left vertebral artery is dominant. The  right vertebral artery is relatively hypoplastic. There is no significant stenosis of either vertebral artery in the neck. Skeleton: Multilevel degenerative disc disease. Other neck: Negative. Upper chest: The lung apices are clear. Review of the MIP images confirms the above findings CTA HEAD FINDINGS Anterior circulation: There is moderate to severe stenosis of the cranial and cavernous segments of the internal carotid arteries bilaterally. There is extensive calcific atheromatous disease with probable high-grade stenoses of the ophthalmic segments and communicating segments bilaterally, particularly on the right. Estimated luminal stenosis is 70-80%. There is no clear large vessel occlusion. The anterior and middle cerebral arteries demonstrate no flow-limiting stenosis or aneurysm. Posterior circulation: There is extensive calcific plaque within the V4 segments of the vertebral arteries bilaterally, with moderate to severe stenosis of the right V4 segment. There is moderate to severe multifocal stenosis of the basilar artery, similar to the prior exam. There are moderate stenoses within the posterior cerebral arteries bilaterally. Venous sinuses: Poorly opacified.  No clear thrombosis. Anatomic variants: None. Review of the MIP images confirms the above findings IMPRESSION: 1. Moderate to severe stenosis of the cervical segment of the right internal carotid artery, proximally 70%. 2. Moderate to severe stenoses of the cavernous and supraclinoid segments of both internal carotid arteries. 3. 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 4. Status post interval left Internal carotid endarterectomy with restoration of blood flow within the distal cervical segment. These results were communicated to Dr. Jerrie at 4:30 pm on 06/27/2024 by text page via the [facility]  messaging system. Electronically Signed   By: Evalene Coho M.D.   On: 06/27/2024 16:50   CT HEAD CODE STROKE WO CONTRAST Result Date: 06/27/2024 CLINICAL DATA:  Code stroke. Right-sided weakness and leftward gaze. EXAM: CT HEAD WITHOUT CONTRAST TECHNIQUE: Contiguous axial images were obtained from the  base of the skull through the vertex without intravenous contrast. RADIATION DOSE REDUCTION: This exam was performed according to the departmental dose-optimization program which includes automated exposure control, adjustment of the mA and/or kV according to patient size and/or use of iterative reconstruction technique. COMPARISON:  CT the head dated May 29, 2024. FINDINGS: Brain: There is age related cerebral volume loss. Chronic encephalomalacia changes are noted within the left posterior frontal and parietal lobes. There is a separate area of encephalomalacia present anteriorly within the left frontal lobe. There are encephalomalacia changes also again demonstrated within the left cerebellar hemisphere. There are chronic lacunar infarcts within the thalami bilaterally. There is no evidence of hemorrhage, mass, acute cortical infarct or hydrocephalus. Vascular: Extensive vascular calcifications within the carotid siphons and vertebral arteries. Skull: Intact and unremarkable. Sinuses/Orbits: The visualized paranasal sinuses and mastoid air cells are clear. The orbits are unremarkable. Other: None. IMPRESSION: 1. Chronic encephalomalacia changes within the left frontal and parietal lobes and left cerebellar hemisphere with chronic lacunar infarcts within the thalami bilaterally. No apparent acute process. 2. ASPECTS is 10. 3. These results were communicated to Dr. Jerrie at 3:58 pm on 06/27/2024 by text page via the [facility] messaging system. Electronically Signed   By: Evalene Coho M.D.   On: 06/27/2024 16:00      Time coordinating discharge: over 30 minutes  SIGNED:  Ada Woodbury DO [facility]

## 2024-06-29 NOTE — Progress Notes (Signed)
 Occupational Therapy Treatment Patient Details Name: James Lambert MRN: [id number] DOB: 07-27-1966 Today's Date: 06/29/2024   History of present illness Pt is a 58 y.o. male who presents to the ED on 06/27/24 with acute onset of lightheadedness and reports of shaking with a near syncopal episode, while getting a haircut. Pt denies new paraesthesias, new muscular weakness, or slurred speech. Pt was admitted for seizure, HTN, dislipidimia, and TIA. Imaging revealed no acute intracranial abnormalities, or findings to suggest acute infarct. 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 PMH: tobacco abuse, alcohol abuse, stroke with residual right-sided weakness, HTN, HLD, COPD, asthma, obesity, chronic bilateral knee pain, OSA not on CPAP,   OT comments  Upon entering the room, pt seated in recliner chair and agreeable to OT intervention. Pt declines self care tasks this session. He appears to have been utilizing urinal independently while seated in recliner chair.  Pt able to utilize RW with min cues for safety awareness and hand placement with min guard for mobility. Pt give SLUMS during this session.  Pt completed SLUMS examination this date scoring 19/30 indicating a *positive screen for dementia. Of note, it is not within occupational therapy scope of practice to diagnose cognitive impairments, this screen indicates need for further testing. The SLUMS is a 30 point, 11 question screening questionnaire that tests orientation, memory, attention, and executive function. Pt with noted impairments in short term memory, problem solving, and executive function limiting ability to safely perform tasks/IADLs. Pt does report feeling like he is foggy and no quite clear thinking. Pt endorses being caregiver for his brother at home. OT  recommends pt needing assistance with medication management and use of pill box secondary to memory deficits based on how to reports normally performing tasks. He states he has a friend that can come over and set up pill box for him. Discussed other safety concerns in home such as leaving stove on when cooking that could create a safety issue. Pt verbalized understanding and asked appropriate questions. Pt requests to remain in recliner chair at end of session with call bell and all needed items within reach.       If plan is discharge home, recommend the following:  A little help with walking and/or transfers;A little help with bathing/dressing/bathroom;Help with stairs or ramp for entrance;Assist for transportation;Assistance with cooking/housework;Direct supervision/assist for financial management;Direct supervision/assist for medications management   Equipment Recommendations  Tub/shower bench           Mobility Bed Mobility                    Transfers Overall transfer level: Needs assistance Equipment used: Rolling walker (2 wheels) Transfers: Sit to/from Stand Sit to Stand: Contact guard assist                     ADL either performed or assessed with clinical judgement     Vision Patient Visual Report: No change from baseline           Communication Communication Communication: No apparent difficulties   Cognition Arousal: Alert Behavior During Therapy: WFL for tasks assessed/performed Cognition: History of cognitive impairments, Cognition impaired   Orientation impairments: Time  Following commands: Impaired Following commands impaired: Follows one step commands with increased time      Cueing   Cueing Techniques: Verbal cues             Pertinent Vitals/ Pain       Pain Assessment Pain Assessment: No/denies pain         Frequency  Min 2X/week        Progress Toward Goals  OT Goals(current  goals can now be found in the care plan section)  Progress towards OT goals: Progressing toward goals      AM-PAC OT 6 Clicks Daily Activity     Outcome Measure   Help from another person eating meals?: None Help from another person taking care of personal grooming?: None Help from another person toileting, which includes using toliet, bedpan, or urinal?: None Help from another person bathing (including washing, rinsing, drying)?: A Little Help from another person to put on and taking off regular upper body clothing?: None Help from another person to put on and taking off regular lower body clothing?: A Little 6 Click Score: 22    End of Session Equipment Utilized During Treatment: Rolling walker (2 wheels)  OT Visit Diagnosis: Other abnormalities of gait and mobility (R26.89)   Activity Tolerance Patient tolerated treatment well   Patient Left in chair;with call bell/phone within reach;with chair alarm set   Nurse Communication          Time: 8948-8887 OT Time Calculation (min): 21 min  Charges: OT General Charges $OT Visit: 1 Visit OT Treatments $Therapeutic Activity: 8-22 mins  Izetta Claude, MS, OTR/L , CBIS ascom [phone]  06/29/24, 3:00 PM

## 2024-06-29 NOTE — Plan of Care (Signed)
  Problem: Education: Goal: Knowledge of disease or condition will improve Outcome: Progressing   Problem: Coping: Goal: Will verbalize positive feelings about self Outcome: Progressing   Problem: Self-Care: Goal: Ability to participate in self-care as condition permits will improve Outcome: Progressing   Problem: Nutrition: Goal: Risk of aspiration will decrease Outcome: Progressing

## 2024-06-29 NOTE — Plan of Care (Signed)
 The patient's NIH score is 7. The patient c/o bilateral knee pain 10/10 at bedtime. PRN IV Morphine  2mg  was given. The patient also c/o heartburn PRN maalox was given.

## 2024-06-29 NOTE — Progress Notes (Signed)
 Patient given AVS paperwork, Reviewed with patient that he will have zio patch delivered to home with instructions on how to use, Also provided cardiology contact info in AVS if he needed help along with zio patch contact info, Informed patient of primary care appointment on 8/5 and of pt/ot outpatient to start, and that they will call to schedule appointment. Informed patient that he has prescribtions that will need to be picked up. Reviewed which meds will need to be taken tonight and when to next take remaining meds/home meds. Informed patient that he will be on oxycodone  and informed him when he can next take dose as needed. Side effects stated. Patient verbalized understanding. Patients belongings collected. Patient's brother Dontante to pick up patient. Informed brother that prescriptions will have to be picked up at patient's pharmacy, brother verbalized understanding. Patient verbalized understanding of all instructions, written AVS handed to patient in which he placed in belongings bag.

## 2024-06-29 NOTE — Progress Notes (Signed)
 Physical Therapy Treatment Patient Details Name: James Lambert MRN: [id number] DOB: 08-16-1966 Today's Date: 06/29/2024   History of Present Illness Pt is a 58 y.o. male who presents to the ED on 06/27/24 with acute onset of lightheadedness and reports of shaking with a near syncopal episode, while getting a haircut. Pt denies new paraesthesias, new muscular weakness, or slurred speech. Pt was admitted for seizure, HTN, dislipidimia, and TIA. Imaging revealed no acute intracranial abnormalities, or findings to suggest acute infarct. 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 PMH: tobacco abuse, alcohol abuse, stroke with residual right-sided weakness, HTN, HLD, COPD, asthma, obesity, chronic bilateral knee pain, OSA not on CPAP,    PT Comments  Pt is supine in bed with HOB elevated, and agreeable to therapy today. PT focused today's session on increasing activity tolerance and ambulation distance. During session pt was able to perform bed mobility with SBA (for increased safety), performed 1x STS with CGA from bed raised significantly (secondary to reported pain from sitting in bed for too long) and was able to ambulate 220 ft with RW and CGA. During ambulation PT noted that gait deviations observed during initial evaluation are still present, with limited ability to correct (most likely secondary to increased discomfort and pain on B knees). Following session, PT educated on the importance of movement to allow for synovial fluid to lubricate the joints, pt verbalized understanding and requested if PT could order knee braces (compressive sleeves). PT discussed potential benefit of B knee support sleeve and it proprioceptive properties which may reduce reported pain (PT notified MD of pt's request).Pt would benefit from skilled  PT to continue to work towards PT goals and return to PLOF.      If plan is discharge home, recommend the following: A little help with walking and/or transfers;A little help with bathing/dressing/bathroom;Assist for transportation;Help with stairs or ramp for entrance   Can travel by private vehicle        Equipment Recommendations  None recommended by PT    Recommendations for Other Services       Precautions / Restrictions Precautions Precautions: Fall Recall of Precautions/Restrictions: Intact Restrictions Weight Bearing Restrictions Per Provider Order: No     Mobility  Bed Mobility Overal bed mobility: Needs Assistance Bed Mobility: Supine to Sit, Sit to Supine     Supine to sit: Supervision, HOB elevated, Used rails Sit to supine: Supervision, HOB elevated        Transfers Overall transfer level: Needs assistance Equipment used: Rolling walker (2 wheels) Transfers: Sit to/from Stand Sit to Stand: Contact guard assist, From elevated surface           General transfer comment: 1x STS from bed with bed height significantly rasied, per patient's request (to help offload B knees)    Ambulation/Gait Ambulation/Gait assistance: Contact guard assist Gait Distance (Feet): 220 Feet Assistive device: Rolling walker (2 wheels) Gait Pattern/deviations: Step-to pattern, Wide base of support, Decreased dorsiflexion - right (Genu valgum present throughout entire bout of ambulation today) Gait velocity: decreased     General Gait Details: PT continues to demonstrate greater supination of R foot with limited ability to dorsiflex, no heelstrike on R LE during RLE loading and increased genu valgum throughout ambulation bout without ability to correct.       Balance Overall balance assessment: Needs assistance Sitting-balance support: Feet  supported Sitting balance-Leahy Scale: Good Sitting balance - Comments: steady sitting, able reaching within BOS   Standing  balance support: Bilateral upper extremity supported, During functional activity, Reliant on assistive device for balance Standing balance-Leahy Scale: Fair Standing balance comment: Forward flexed on RW, initially leaning excessively due to reported knee pain. Pain is reduced with further ambulation.        Communication Communication Communication: No apparent difficulties  Cognition Arousal: Alert Behavior During Therapy: WFL for tasks assessed/performed   PT - Cognitive impairments: No apparent impairments     Following commands: Intact            General Comments General comments (skin integrity, edema, etc.): Pre session: HR: 70 bpm, SpO2: 99% on room air, BP: 123/93(104) , Post session: HR: 78 bpm, SpO2: 99% on room air.      Pertinent Vitals/Pain Pain Assessment Pain Assessment: 0-10 Pain Score: 7  Pain Location: B knees R>L Pain Descriptors / Indicators: Discomfort, Guarding, Heaviness, Constant Pain Intervention(s): Limited activity within patient's tolerance, Monitored during session, Patient requesting pain meds-RN notified     PT Goals (current goals can now be found in the care plan section) Acute Rehab PT Goals Patient Stated Goal: to get stronger PT Goal Formulation: With patient Time For Goal Achievement: 07/12/24 Potential to Achieve Goals: Good Progress towards PT goals: Progressing toward goals    Frequency    Min 2X/week       AM-PAC PT 6 Clicks Mobility   Outcome Measure  Help needed turning from your back to your side while in a flat bed without using bedrails?: None Help needed moving from lying on your back to sitting on the side of a flat bed without using bedrails?: None Help needed moving to and from a bed to a chair (including a wheelchair)?: A Little Help needed standing up from a chair using your arms (e.g., wheelchair or bedside chair)?: A Little Help needed to walk in hospital room?: A Little Help needed climbing 3-5 steps  with a railing? : A Little 6 Click Score: 20    End of Session Equipment Utilized During Treatment: Gait belt Activity Tolerance: Patient tolerated treatment well Patient left: in chair;with chair alarm set;with call bell/phone within reach Nurse Communication: Mobility status;Patient requests pain meds PT Visit Diagnosis: Unsteadiness on feet (R26.81);Other abnormalities of gait and mobility (R26.89);Muscle weakness (generalized) (M62.81)     Time: 8581-8553 PT Time Calculation (min) (ACUTE ONLY): 28 min  Charges:                            Dinna Severs, SPT 06/29/24, 3:20 PM

## 2024-06-29 NOTE — Hospital Course (Addendum)
 Hospital course / significant events:   HPI: 58 y.o. male with medical history significant for asthma, COPD, CVA with mild residual right-sided weakness, GERD, dyslipidemia, hypertension, seizure and OSA, who presented to the emergency room with acute onset of lightheadedness.   07/21: In the ER the patient was witnessed to have an episode of slurred speech with right arm jerking which resolved spontaneously. Admitted to hospitalist  07/22: EEG negative. Echo pending. Neuro recs see A/P and s/o 07/23: Echo LVEF 45-50, global hypokinesis, Grade 1 diast df, no valvular abn or shunt. Cardiology to send Zio monitor and he will need outpatient f/u. Did well w/ therapy, ok for outpatient f/u w/ PT.      Consultants:  Neurology  Procedures/Surgeries: none      ASSESSMENT & PLAN:   Possible TIA vs hypoperfusion through critical stenosis MRI without acute stroke but notable for frontal encephalomalacia  Echo LVEF 45-50, global hypokinesis, Grade 1 diast df, no valvular abn or shunt.  zio patch on discharge - cardiology will mail to him A1c and LDL meeting goals 90 days of DAPT due to severe intracranial atherosclerosis (Aspirin  and Plavix ), followed by Plavix  monotherapy. If patient cannot afford plavix , should stay on baby aspirin  Neuro s/o    Right leg cramping, R hand spasm, focal seizure vs. More likely post-stroke spacticity EEG completed, negative Tizanidine  2 mg nightly with additional 2 mg PRN (discontinued flexeril )  PT/OT  MRI brain (+)frontal encephalomalacia - likely clinically significant given (+)dementia screening and concern for mild cognitive impairment  Close outpatient follow up   HTN BP under good control  Abnormal echo without evidence of heart failure Echo LVEF 45-50, global hypokinesis, Grade 1 diast df, no valvular abn or shunt.  No ischemia on EKG and no CP/SOB, no edema  Cardiology follow up outpatient  Already on beta blocker, ARB, ASA, Plavix , statin     GERD without esophagitis Continue PPI  Alcohol use history B12 deficiency start B12 1000 mcg daily for goal B12 > 1000  empiric thiamine  supplementation EtOH abstinence   Hidradenitis Augmentin  x 10 days (to complete 07/24) finish outpatient rx   Smoking: Counseled on the importance of quitting smoking  Nicotine  gum PRN Additional counseled on association of marijuana use with stroke   *** based on BMI: Body mass index is 29.66 kg/m.[name] Significantly low or high BMI is associated with higher medical risk.  Underweight - under 18  overweight - 25 to 29 obese - 30 or more Class 1 obesity: BMI of 30.0 to 34 Class 2 obesity: BMI of 35.0 to 39 Class 3 obesity: BMI of 40.0 to 49 Super Morbid Obesity: BMI 50-59 Super-super Morbid Obesity: BMI 60+ Healthy nutrition and physical activity advised as adjunct to other disease management and risk reduction treatments    DVT prophylaxis: *** IV fluids: *** continuous IV fluids  Nutrition: *** Central lines / other devices: ***  Code Status: *** ACP documentation reviewed: *** none on file in [facility]  TOC needs: *** Medical barriers to dispo: ***. Expected medical readiness for discharge ***.

## 2024-06-29 NOTE — Telephone Encounter (Signed)
-----   Message from Lesley LITTIE Maffucci sent at 06/29/2024 11:44 AM EDT ----- This patient will be discharged from [facility] today. Can you please mail him a Zio XT 30 days for TIA? Thank you.

## 2024-06-29 NOTE — Progress Notes (Signed)
..      [facility] REFERRAL        Occupational Therapy * Physical Therapy * Speech Therapy                           DATE 06/29/24  PATIENT NAME James Lambert  PATIENT MRN [id number]       DIAGNOSIS/DIAGNOSIS CODE TIA G45.9  DATE OF DISCHARGE: 06/29/2024       PRIMARY CARE PHYSICIAN      PCP PHONE/FAX Melanie Polio [phone]     Dear Provider (Name: [facility] outpatient __  Fax: [phone]   I certify that I have examined this patient and that occupational/physical/speech therapy is necessary on an outpatient basis.    The patient has expressed interest in completing their recommended course of therapy at your  location.  Once a formal order from the patient's primary care physician has been obtained, please  contact him/her to schedule an appointment for evaluation at your earliest convenience.   [ X]  Physical Therapy Evaluate and Treat  [  X]  Occupational Therapy Evaluate and Treat  [  ]  Speech Therapy Evaluate and Treat         The patient's primary care physician (listed above) must furnish and be responsible for a formal order such that the recommended services may be furnished while under the primary physician's care, and that the plan of care will be established and reviewed every 30 days (or more often if condition necessitates).

## 2024-06-29 NOTE — Progress Notes (Signed)
 Per MD, patient ready for DC. Patient's brother Dontate called and notified of pending DC.

## 2024-06-30 LAB — VITAMIN B1: Vitamin B1 (Thiamine): 89.5 nmol/L (ref 66.5–200.0)

## 2024-07-05 ENCOUNTER — Telehealth: Payer: Self-pay | Admitting: *Deleted

## 2024-07-05 ENCOUNTER — Other Ambulatory Visit: Payer: Self-pay | Admitting: *Deleted

## 2024-07-05 DIAGNOSIS — Z87891 Personal history of nicotine dependence: Secondary | ICD-10-CM

## 2024-07-05 DIAGNOSIS — F17210 Nicotine dependence, cigarettes, uncomplicated: Secondary | ICD-10-CM

## 2024-07-05 DIAGNOSIS — Z122 Encounter for screening for malignant neoplasm of respiratory organs: Secondary | ICD-10-CM

## 2024-07-05 NOTE — Telephone Encounter (Signed)
 Lung Cancer Screening Narrative/Criteria Questionnaire (Cigarette Smokers Only- No Cigars/Pipes/vapes)   James Lambert   SDMV:07/13/24 12:30- Kristen                                           1966-02-01              LDCT: 07/15/24 1:00- [facility]    57 y.o.   Phone: [phone]  Lung Screening Narrative (confirm age 86-77 yrs Medicare / 50-80 yrs Private pay insurance)   Insurance information:[employer]   Referring Provider:Alexander   This screening involves an initial phone call with a team member from our program. It is called a shared decision making visit. The initial meeting is required by insurance and Medicare to make sure you understand the program. This appointment takes about 15-20 minutes to complete. The CT scan will completed at a separate date/time. This scan takes about 5-10 minutes to complete and you may eat and drink before and after the scan.  Criteria questions for Lung Cancer Screening:   Are you a current or former smoker? Current Age began smoking: 31   If you are a former smoker, what year did you quit smoking?  (within 15 yrs)   To calculate your smoking history, I need an accurate estimate of how many packs of cigarettes you smoked per day and for how many years. (Not just the number of PPD you are now smoking)   Years smoking 26 x Packs per day 1 = Pack years 26   (at least 20 pack yrs)   (Make sure they understand that we need to know how much they have smoked in the past, not just the number of PPD they are smoking now)  Do you have a personal history of cancer?  No    Do you have a family history of cancer? No  Are you coughing up blood?  No  Have you had unexplained weight loss of 15 lbs or more in the last 6 months? No  It looks like you meet all criteria.     Additional information: N/A

## 2024-07-08 NOTE — Patient Instructions (Signed)

## 2024-07-12 ENCOUNTER — Other Ambulatory Visit: Payer: Self-pay | Admitting: *Deleted

## 2024-07-12 ENCOUNTER — Encounter: Payer: Self-pay | Admitting: Nurse Practitioner

## 2024-07-12 ENCOUNTER — Ambulatory Visit: Payer: Self-pay | Admitting: Nurse Practitioner

## 2024-07-12 ENCOUNTER — Ambulatory Visit: Attending: Physician Assistant

## 2024-07-12 VITALS — BP 111/69 | HR 65 | Temp 98.6°F | Ht 74.0 in | Wt 247.0 lb

## 2024-07-12 DIAGNOSIS — Z23 Encounter for immunization: Secondary | ICD-10-CM | POA: Diagnosis not present

## 2024-07-12 DIAGNOSIS — I700 Atherosclerosis of aorta: Secondary | ICD-10-CM

## 2024-07-12 DIAGNOSIS — G459 Transient cerebral ischemic attack, unspecified: Secondary | ICD-10-CM

## 2024-07-12 DIAGNOSIS — E669 Obesity, unspecified: Secondary | ICD-10-CM

## 2024-07-12 DIAGNOSIS — I10 Essential (primary) hypertension: Secondary | ICD-10-CM

## 2024-07-12 DIAGNOSIS — I69351 Hemiplegia and hemiparesis following cerebral infarction affecting right dominant side: Secondary | ICD-10-CM

## 2024-07-12 DIAGNOSIS — J439 Emphysema, unspecified: Secondary | ICD-10-CM | POA: Diagnosis not present

## 2024-07-12 DIAGNOSIS — N400 Enlarged prostate without lower urinary tract symptoms: Secondary | ICD-10-CM

## 2024-07-12 DIAGNOSIS — R569 Unspecified convulsions: Secondary | ICD-10-CM

## 2024-07-12 DIAGNOSIS — Z8673 Personal history of transient ischemic attack (TIA), and cerebral infarction without residual deficits: Secondary | ICD-10-CM

## 2024-07-12 DIAGNOSIS — K219 Gastro-esophageal reflux disease without esophagitis: Secondary | ICD-10-CM

## 2024-07-12 DIAGNOSIS — F17210 Nicotine dependence, cigarettes, uncomplicated: Secondary | ICD-10-CM

## 2024-07-12 DIAGNOSIS — E782 Mixed hyperlipidemia: Secondary | ICD-10-CM

## 2024-07-12 DIAGNOSIS — F1010 Alcohol abuse, uncomplicated: Secondary | ICD-10-CM

## 2024-07-12 MED ORDER — CLOPIDOGREL BISULFATE 75 MG PO TABS: 75.0000 mg | ORAL_TABLET | Freq: Every day | ORAL | 1 refills | Status: AC

## 2024-07-12 MED ORDER — VITAMIN B-1 100 MG PO TABS: 100.0000 mg | ORAL_TABLET | Freq: Every day | ORAL | 4 refills | Status: AC

## 2024-07-12 MED ORDER — AMLODIPINE BESYLATE 10 MG PO TABS: 10.0000 mg | ORAL_TABLET | Freq: Every day | ORAL | 1 refills | Status: AC

## 2024-07-12 MED ORDER — PANTOPRAZOLE SODIUM 40 MG PO TBEC: 40.0000 mg | DELAYED_RELEASE_TABLET | Freq: Every day | ORAL | 1 refills | Status: AC

## 2024-07-12 MED ORDER — ATORVASTATIN CALCIUM 80 MG PO TABS: 80.0000 mg | ORAL_TABLET | Freq: Every day | ORAL | 3 refills | Status: AC

## 2024-07-12 MED ORDER — METOPROLOL SUCCINATE ER 50 MG PO TB24: 50.0000 mg | ORAL_TABLET | Freq: Every day | ORAL | 1 refills | Status: AC

## 2024-07-12 NOTE — Assessment & Plan Note (Signed)
 Chronic, ongoing.  Continue current medication regimen and adjust as needed. Lipid panel today.

## 2024-07-12 NOTE — Telephone Encounter (Signed)
 Attempted to call patient back to let him know that I had to re-order his ZIO monitor.  The order placed in the hospital did not indicated that the ZIO Heart Monitor be mailed to his home.  Will try to reach patient again.

## 2024-07-12 NOTE — Assessment & Plan Note (Signed)
 Chronic and ongoing.  BP is at goal today.  Recommend he monitor BP at least a few mornings a week at home and document.  DASH diet at home.  Continue current medication regimen and adjust as needed.  Labs today: CBC, TSH, CMP.  Scheduled to see cardiology in September, appreciate their input.

## 2024-07-12 NOTE — Assessment & Plan Note (Signed)
 Chronic, ongoing.  Has had benefit with Anoro daily and Albuterol  PRN.  Continue this regimen.  Recommend complete cessation of smoking. Scheduled for lung cancer screening tomorrow. He is working via 1-800 line to obtain medication for cessation.

## 2024-07-12 NOTE — Assessment & Plan Note (Signed)
 Chronic, ongoing.  At this time continue Protonix  as is on Plavix  and continues to drink alcohol.  Risks of PPI use were discussed with patient including bone loss, C. Diff diarrhea, pneumonia, infections, CKD, electrolyte abnormalities. Verbalizes understanding and chooses to continue the medication.  Mag level today.

## 2024-07-12 NOTE — Assessment & Plan Note (Signed)
 Chronic, ongoing issue.  He has cut back, however recommend complete cessation of alcohol use to help prevent elevation in BP and stroke/seizures.

## 2024-07-12 NOTE — Assessment & Plan Note (Signed)
 Three CVA in 2023.  Continue to collaborate with neurology.  Has family history of CVA, brother.  Recommend continue all current medications and discussed neuro goals: A1c <6.5%, LDL <55, and BP <130/80.  High risk for recurrent CVA, monitor closely and recommend complete cessation of smoking and alcohol use.  Referral for him to return to neurology.

## 2024-07-12 NOTE — Telephone Encounter (Signed)
 Pt called in asking for update on heart monitor. He states they have not received yet.

## 2024-07-12 NOTE — Progress Notes (Signed)
 Order placed for Zio Monitor XT for pt to wear 14 days.  Zio Monitor to be mailed to patients home

## 2024-07-12 NOTE — Assessment & Plan Note (Signed)
 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  Improved, strength returned and mobility improved.

## 2024-07-12 NOTE — Telephone Encounter (Signed)
 Spoke with patient to let him know that a new order was placed for ZIO heart monitor and that he should be receiving it in 3-4 days.  Asked patient if he had any questions regarding the heart monitor and he states no.

## 2024-07-12 NOTE — Assessment & Plan Note (Signed)
 I have recommended complete cessation of tobacco use. I have discussed various options available for assistance with tobacco cessation including over the counter methods (Nicotine  gum, patch and lozenges). We also discussed prescription options (Chantix, Nicotine  Inhaler / Nasal Spray). The patient is not interested in pursuing any prescription tobacco cessation options at this time.  To have lung cancer screening tomorrow.

## 2024-07-12 NOTE — Assessment & Plan Note (Signed)
BMI 31.71.  Recommended eating smaller high protein, low fat meals more frequently and exercising 30 mins a day 5 times a week with a goal of 10-15lb weight loss in the next 3 months. Patient voiced their understanding and motivation to adhere to these recommendations.

## 2024-07-12 NOTE — Assessment & Plan Note (Signed)
 Recent seizure in July 2025, ?if related to alcohol use. Will get back into neurology. No further seizure activity and no alcohol use in 3 weeks, recommend continued cessation.

## 2024-07-12 NOTE — Assessment & Plan Note (Signed)
 Noted on imaging 06/08/22.  Recommend complete cessation of smoking + continue daily Plavix and statin for prevention.

## 2024-07-12 NOTE — Progress Notes (Signed)
 BP 111/69   Pulse 65   Temp 98.6 F (37 C) (Oral)   Ht 6' 2 (1.88 m)   Wt 247 lb (112 kg)   SpO2 98%   BMI 31.71 kg/m    Subjective:    Patient ID: James Lambert, male    DOB: August 11, 1966, 58 y.o.   MRN: [id number]  HPI: James Lambert is a 58 y.o. male  Chief Complaint  Patient presents with   Hyperlipidemia   Hypertension   IFG   Seizures   HYPERTENSION / HYPERLIPIDEMIA Taking Amlodipine , Benicar , Metoprolol , Plavix , Atorvastatin . He has been ordered a Zio monitor by the ER recently and has referral to cardiology, scheduled to see them in September.  Was noted to have lower EF on echo in hospital. Reports he has not drank any alcohol in 3-4 weeks, prior to this was drinking two beers a day.  Was admitted to hospital in July for seizures, last saw neurology on 10/20/22. History of CVA with right side affected, but this has improved. Satisfied with current treatment? yes Duration of hypertension: chronic BP monitoring frequency: not checking BP range:  BP medication side effects: no Past BP meds:  Duration of hyperlipidemia: chronic Cholesterol medication side effects: no Cholesterol supplements: none Medication compliance: good compliance Aspirin : yes Recent stressors: no Recurrent headaches: no Visual changes: no Palpitations: no Dyspnea: no Chest pain: no Lower extremity edema: no Dizzy/lightheaded: no   COPD Continues to use Anoro daily and Albuterol  as needed. Has seen benefit with Anoro.  Continues to smoke 1-2 cigarettes a day, but is working on quitting.  Has smoked for about 20 years. COPD status: stable Satisfied with current treatment?: yes Oxygen use: no Dyspnea frequency: none Cough frequency: none Rescue inhaler frequency: never needs to use Limitation of activity: no Productive cough: none Last Spirometry: unknown Pneumovax: will get next visit Influenza: Up to Date   GERD Taking Omeprazole as needed. Takes 1-2 times a week. GERD  control status: stable Satisfied with current treatment? yes Heartburn frequency: as above Medication side effects: no  Medication compliance: stable Dysphagia: no Odynophagia:  no Hematemesis: no Blood in stool: no EGD: no    Relevant past medical, surgical, family and social history reviewed and updated as indicated. Interim medical history since our last visit reviewed. Allergies and medications reviewed and updated.  Review of Systems  Constitutional:  Negative for activity change, diaphoresis, fatigue and fever.  Respiratory:  Negative for cough, chest tightness, shortness of breath and wheezing.   Cardiovascular:  Negative for chest pain, palpitations and leg swelling.  Gastrointestinal: Negative.   Endocrine: Negative for polydipsia, polyphagia and polyuria.  Neurological: Negative.   Psychiatric/Behavioral: Negative.     Per HPI unless specifically indicated above     Objective:    BP 111/69   Pulse 65   Temp 98.6 F (37 C) (Oral)   Ht 6' 2 (1.88 m)   Wt 247 lb (112 kg)   SpO2 98%   BMI 31.71 kg/m   Wt Readings from Last 3 Encounters:  07/12/24 247 lb (112 kg)  06/27/24 231 lb (104.8 kg)  06/20/24 231 lb (104.8 kg)    Physical Exam Vitals and nursing note reviewed.  Constitutional:      General: He is awake. He is not in acute distress.    Appearance: He is well-developed and well-groomed. He is obese. He is not ill-appearing or toxic-appearing.  HENT:     Head: Normocephalic.     Right  Ear: Hearing and external ear normal.     Left Ear: Hearing and external ear normal.  Eyes:     General: Lids are normal.     Extraocular Movements: Extraocular movements intact.     Conjunctiva/sclera: Conjunctivae normal.  Neck:     Thyroid: No thyromegaly.     Vascular: No carotid bruit.  Cardiovascular:     Rate and Rhythm: Normal rate and regular rhythm.     Heart sounds: Normal heart sounds. No murmur heard.    No gallop.  Pulmonary:     Effort: No  accessory muscle usage or respiratory distress.     Breath sounds: Normal breath sounds.  Abdominal:     General: Bowel sounds are normal. There is no distension.     Palpations: Abdomen is soft.     Tenderness: There is no abdominal tenderness.  Musculoskeletal:     Cervical back: Full passive range of motion without pain.     Right lower leg: 1+ Edema present.     Left lower leg: 1+ Edema present.  Lymphadenopathy:     Cervical: No cervical adenopathy.  Skin:    General: Skin is warm.     Capillary Refill: Capillary refill takes less than 2 seconds.  Neurological:     Mental Status: He is alert and oriented to person, place, and time.     Cranial Nerves: Cranial nerves 2-12 are intact.     Motor: Motor function is intact.     Coordination: Coordination is intact.     Gait: Gait is intact.     Deep Tendon Reflexes: Reflexes are normal and symmetric.     Reflex Scores:      Brachioradialis reflexes are 2+ on the right side and 2+ on the left side.      Patellar reflexes are 2+ on the right side and 2+ on the left side. Psychiatric:        Attention and Perception: Attention normal.        Mood and Affect: Mood normal.        Speech: Speech normal.        Behavior: Behavior normal. Behavior is cooperative.        Thought Content: Thought content normal.    Results for orders placed or performed during the hospital encounter of 06/27/24  CBG monitoring, ED   Collection Time: 06/27/24  3:32 PM  Result Value Ref Range   Glucose-Capillary 84 70 - 99 mg/dL  Comprehensive metabolic panel   Collection Time: 06/27/24  5:28 PM  Result Value Ref Range   Sodium 138 135 - 145 mmol/L   Potassium 4.0 3.5 - 5.1 mmol/L   Chloride 108 98 - 111 mmol/L   CO2 22 22 - 32 mmol/L   Glucose, Bld 76 70 - 99 mg/dL   BUN 19 6 - 20 mg/dL   Creatinine, Ser 8.80 0.61 - 1.24 mg/dL   Calcium  8.1 (L) 8.9 - 10.3 mg/dL   Total Protein 7.0 6.5 - 8.1 g/dL   Albumin 3.2 (L) 3.5 - 5.0 g/dL   AST 11 (L) 15  - 41 U/L   ALT 8 0 - 44 U/L   Alkaline Phosphatase 48 38 - 126 U/L   Total Bilirubin 0.6 0.0 - 1.2 mg/dL   GFR, Estimated >39 >39 mL/min   Anion gap 8 5 - 15  CBC with Differential   Collection Time: 06/27/24  5:28 PM  Result Value Ref Range   WBC 5.1 4.0 -  10.5 K/uL   RBC 4.01 (L) 4.22 - 5.81 MIL/uL   Hemoglobin 12.3 (L) 13.0 - 17.0 g/dL   HCT 61.4 (L) 60.9 - 47.9 %   MCV 96.0 80.0 - 100.0 fL   MCH 30.7 26.0 - 34.0 pg   MCHC 31.9 30.0 - 36.0 g/dL   RDW 85.6 88.4 - 84.4 %   Platelets 305 150 - 400 K/uL   nRBC 0.0 0.0 - 0.2 %   Neutrophils Relative % 46 %   Neutro Abs 2.3 1.7 - 7.7 K/uL   Lymphocytes Relative 40 %   Lymphs Abs 2.0 0.7 - 4.0 K/uL   Monocytes Relative 8 %   Monocytes Absolute 0.4 0.1 - 1.0 K/uL   Eosinophils Relative 4 %   Eosinophils Absolute 0.2 0.0 - 0.5 K/uL   Basophils Relative 1 %   Basophils Absolute 0.0 0.0 - 0.1 K/uL   Immature Granulocytes 1 %   Abs Immature Granulocytes 0.05 0.00 - 0.07 K/uL  CK   Collection Time: 06/27/24  5:28 PM  Result Value Ref Range   Total CK 139 49 - 397 U/L  Ethanol   Collection Time: 06/27/24  5:28 PM  Result Value Ref Range   Alcohol, Ethyl (B) <15 <15 mg/dL  Protime-INR   Collection Time: 06/27/24  5:28 PM  Result Value Ref Range   Prothrombin Time 14.6 11.4 - 15.2 seconds   INR 1.1 0.8 - 1.2  APTT   Collection Time: 06/27/24  5:28 PM  Result Value Ref Range   aPTT 28 24 - 36 seconds  Urine Drug Screen, Qualitative   Collection Time: 06/27/24  5:28 PM  Result Value Ref Range   Tricyclic, Ur Screen NONE DETECTED NONE DETECTED   Amphetamines, Ur Screen NONE DETECTED NONE DETECTED   MDMA (Ecstasy)Ur Screen NONE DETECTED NONE DETECTED   Cocaine Metabolite,Ur [place] NONE DETECTED NONE DETECTED   Opiate, Ur Screen NONE DETECTED NONE DETECTED   Phencyclidine (PCP) Ur S NONE DETECTED NONE DETECTED   Cannabinoid 50 Ng, Ur [place] POSITIVE (A) NONE DETECTED   Barbiturates, Ur Screen NONE DETECTED NONE DETECTED    Benzodiazepine, Ur Scrn NONE DETECTED NONE DETECTED   Methadone Scn, Ur NONE DETECTED NONE DETECTED  Troponin I (High Sensitivity)   Collection Time: 06/27/24  5:28 PM  Result Value Ref Range   Troponin I (High Sensitivity) 2 <18 ng/L  CBC   Collection Time: 06/27/24  8:30 PM  Result Value Ref Range   WBC 7.2 4.0 - 10.5 K/uL   RBC 3.91 (L) 4.22 - 5.81 MIL/uL   Hemoglobin 12.3 (L) 13.0 - 17.0 g/dL   HCT 63.1 (L) 60.9 - 47.9 %   MCV 94.1 80.0 - 100.0 fL   MCH 31.5 26.0 - 34.0 pg   MCHC 33.4 30.0 - 36.0 g/dL   RDW 85.6 88.4 - 84.4 %   Platelets 421 (H) 150 - 400 K/uL   nRBC 0.0 0.0 - 0.2 %  Comprehensive metabolic panel with GFR   Collection Time: 06/27/24  8:30 PM  Result Value Ref Range   Sodium 137 135 - 145 mmol/L   Potassium 4.0 3.5 - 5.1 mmol/L   Chloride 101 98 - 111 mmol/L   CO2 24 22 - 32 mmol/L   Glucose, Bld 92 70 - 99 mg/dL   BUN 20 6 - 20 mg/dL   Creatinine, Ser 8.72 (H) 0.61 - 1.24 mg/dL   Calcium  9.4 8.9 - 10.3 mg/dL   Total Protein 7.8 6.5 -  8.1 g/dL   Albumin 3.6 3.5 - 5.0 g/dL   AST 12 (L) 15 - 41 U/L   ALT 7 0 - 44 U/L   Alkaline Phosphatase 55 38 - 126 U/L   Total Bilirubin 0.4 0.0 - 1.2 mg/dL   GFR, Estimated >39 >39 mL/min   Anion gap 12 5 - 15  Differential   Collection Time: 06/27/24  8:30 PM  Result Value Ref Range   Neutrophils Relative % 49 %   Neutro Abs 3.5 1.7 - 7.7 K/uL   Lymphocytes Relative 40 %   Lymphs Abs 2.9 0.7 - 4.0 K/uL   Monocytes Relative 7 %   Monocytes Absolute 0.5 0.1 - 1.0 K/uL   Eosinophils Relative 3 %   Eosinophils Absolute 0.2 0.0 - 0.5 K/uL   Basophils Relative 0 %   Basophils Absolute 0.0 0.0 - 0.1 K/uL   Immature Granulocytes 1 %   Abs Immature Granulocytes 0.05 0.00 - 0.07 K/uL  Hemoglobin A1c   Collection Time: 06/27/24  8:30 PM  Result Value Ref Range   Hgb A1c MFr Bld 5.6 4.8 - 5.6 %   Mean Plasma Glucose 114.02 mg/dL  Troponin I (High Sensitivity)   Collection Time: 06/27/24  8:30 PM  Result Value Ref  Range   Troponin I (High Sensitivity) 3 <18 ng/L  Vitamin B12   Collection Time: 06/27/24 10:53 PM  Result Value Ref Range   Vitamin B-12 262 180 - 914 pg/mL  Vitamin B1   Collection Time: 06/27/24 10:53 PM  Result Value Ref Range   Vitamin B1 (Thiamine ) 89.5 66.5 - 200.0 nmol/L  HIV Antibody (routine testing w rflx)   Collection Time: 06/27/24 11:00 PM  Result Value Ref Range   HIV Screen 4th Generation wRfx Non Reactive Non Reactive  Lipid panel   Collection Time: 06/28/24  4:23 AM  Result Value Ref Range   Cholesterol 75 0 - 200 mg/dL   Triglycerides 57 <849 mg/dL   HDL 22 (L) >59 mg/dL   Total CHOL/HDL Ratio 3.4 RATIO   VLDL 11 0 - 40 mg/dL   LDL Cholesterol 42 0 - 99 mg/dL  Basic metabolic panel   Collection Time: 06/28/24  4:23 AM  Result Value Ref Range   Sodium 137 135 - 145 mmol/L   Potassium 3.8 3.5 - 5.1 mmol/L   Chloride 103 98 - 111 mmol/L   CO2 26 22 - 32 mmol/L   Glucose, Bld 123 (H) 70 - 99 mg/dL   BUN 18 6 - 20 mg/dL   Creatinine, Ser 8.83 0.61 - 1.24 mg/dL   Calcium  8.8 (L) 8.9 - 10.3 mg/dL   GFR, Estimated >39 >39 mL/min   Anion gap 8 5 - 15  CBC   Collection Time: 06/28/24  4:23 AM  Result Value Ref Range   WBC 6.5 4.0 - 10.5 K/uL   RBC 3.66 (L) 4.22 - 5.81 MIL/uL   Hemoglobin 11.2 (L) 13.0 - 17.0 g/dL   HCT 65.6 (L) 60.9 - 47.9 %   MCV 93.7 80.0 - 100.0 fL   MCH 30.6 26.0 - 34.0 pg   MCHC 32.7 30.0 - 36.0 g/dL   RDW 85.8 88.4 - 84.4 %   Platelets 388 150 - 400 K/uL   nRBC 0.0 0.0 - 0.2 %  ECHOCARDIOGRAM COMPLETE   Collection Time: 06/29/24  9:05 AM  Result Value Ref Range   Weight 3,695.97 oz   Height 74 in   BP 146/97 mmHg  Ao pk vel 0.83 m/s   AV Area VTI 3.38 cm2   AR max vel 2.90 cm2   AV Mean grad 1.0 mmHg   AV Peak grad 2.8 mmHg   S' Lateral 2.40 cm   AV Area mean vel 3.02 cm2   Area-P 1/2 2.54 cm2   Est EF 45 - 50%       Assessment & Plan:   Problem List Items Addressed This Visit       Cardiovascular and Mediastinum    Hypertension   Chronic and ongoing.  BP is at goal today.  Recommend he monitor BP at least a few mornings a week at home and document.  DASH diet at home.  Continue current medication regimen and adjust as needed.  Labs today: CBC, TSH, CMP.  Scheduled to see cardiology in September, appreciate their input.        Relevant Medications   amLODipine  (NORVASC ) 10 MG tablet   atorvastatin  (LIPITOR ) 80 MG tablet   metoprolol  succinate (TOPROL -XL) 50 MG 24 hr tablet   Other Relevant Orders   CBC with Differential/Platelet   Comprehensive metabolic panel with GFR   TSH   Aortic atherosclerosis (HCC)   Noted on imaging 06/08/22.  Recommend complete cessation of smoking + continue daily Plavix  and statin for prevention.      Relevant Medications   amLODipine  (NORVASC ) 10 MG tablet   atorvastatin  (LIPITOR ) 80 MG tablet   metoprolol  succinate (TOPROL -XL) 50 MG 24 hr tablet     Respiratory   COPD (chronic obstructive pulmonary disease) (HCC) - Primary   Chronic, ongoing.  Has had benefit with Anoro daily and Albuterol  PRN.  Continue this regimen.  Recommend complete cessation of smoking. Scheduled for lung cancer screening tomorrow. He is working via 1-800 line to obtain medication for cessation.        Digestive   GERD without esophagitis   Chronic, ongoing.  At this time continue Protonix  as is on Plavix  and continues to drink alcohol.  Risks of PPI use were discussed with patient including bone loss, C. Diff diarrhea, pneumonia, infections, CKD, electrolyte abnormalities. Verbalizes understanding and chooses to continue the medication.  Mag level today.       Relevant Medications   pantoprazole  (PROTONIX ) 40 MG tablet   Other Relevant Orders   Magnesium      Nervous and Auditory   Hemiparesis affecting right side as late effect of stroke (HCC)   Ongoing, mild to right side post CVA.  Improved, strength returned and mobility improved.        Other   Seizure [facility])   Recent  seizure in July 2025, ?if related to alcohol use. Will get back into neurology. No further seizure activity and no alcohol use in 3 weeks, recommend continued cessation.      Relevant Orders   Ambulatory referral to Neurology   Obesity (BMI 30-39.9)   BMI 31.71. Recommended eating smaller high protein, low fat meals more frequently and exercising 30 mins a day 5 times a week with a goal of 10-15lb weight loss in the next 3 months. Patient voiced their understanding and motivation to adhere to these recommendations.       Nicotine  dependence, cigarettes, uncomplicated   I have recommended complete cessation of tobacco use. I have discussed various options available for assistance with tobacco cessation including over the counter methods (Nicotine  gum, patch and lozenges). We also discussed prescription options (Chantix, Nicotine  Inhaler / Nasal Spray). The patient is not interested  in pursuing any prescription tobacco cessation options at this time.  To have lung cancer screening tomorrow.      Mixed hyperlipidemia   Chronic, ongoing.  Continue current medication regimen and adjust as needed.  Lipid panel today.       Relevant Medications   amLODipine  (NORVASC ) 10 MG tablet   atorvastatin  (LIPITOR ) 80 MG tablet   metoprolol  succinate (TOPROL -XL) 50 MG 24 hr tablet   Other Relevant Orders   Comprehensive metabolic panel with GFR   Lipid Panel w/o Chol/HDL Ratio   History of CVA (cerebrovascular accident)   Three CVA in 2023.  Continue to collaborate with neurology.  Has family history of CVA, brother.  Recommend continue all current medications and discussed neuro goals: A1c <6.5%, LDL <55, and BP <130/80.  High risk for recurrent CVA, monitor closely and recommend complete cessation of smoking and alcohol use.  Referral for him to return to neurology.      Relevant Orders   Ambulatory referral to Neurology   Alcohol abuse   Chronic, ongoing issue.  He has cut back, however recommend  complete cessation of alcohol use to help prevent elevation in BP and stroke/seizures.      Other Visit Diagnoses       Benign prostatic hyperplasia without lower urinary tract symptoms       PSA check today.   Relevant Orders   PSA     Pneumococcal vaccination given       PCV20 in office today, educated patient.   Relevant Orders   Pneumococcal conjugate vaccine 20-valent (Completed)        Follow up plan: Return in about 6 months (around 01/12/2025) for HTN/HLD, COPD, IFG, Seizures.

## 2024-07-13 ENCOUNTER — Encounter: Payer: Self-pay | Admitting: *Deleted

## 2024-07-13 ENCOUNTER — Ambulatory Visit: Payer: Self-pay | Admitting: Nurse Practitioner

## 2024-07-13 ENCOUNTER — Ambulatory Visit (INDEPENDENT_AMBULATORY_CARE_PROVIDER_SITE_OTHER): Admitting: *Deleted

## 2024-07-13 DIAGNOSIS — F17210 Nicotine dependence, cigarettes, uncomplicated: Secondary | ICD-10-CM

## 2024-07-13 LAB — CBC WITH DIFFERENTIAL/PLATELET
Basophils Absolute: 0 x10E3/uL (ref 0.0–0.2)
Basos: 0 %
EOS (ABSOLUTE): 0.3 x10E3/uL (ref 0.0–0.4)
Eos: 4 %
Hematocrit: 35.6 % — ABNORMAL LOW (ref 37.5–51.0)
Hemoglobin: 11.6 g/dL — ABNORMAL LOW (ref 13.0–17.7)
Immature Grans (Abs): 0 x10E3/uL (ref 0.0–0.1)
Immature Granulocytes: 0 %
Lymphocytes Absolute: 2.4 x10E3/uL (ref 0.7–3.1)
Lymphs: 36 %
MCH: 30.6 pg (ref 26.6–33.0)
MCHC: 32.6 g/dL (ref 31.5–35.7)
MCV: 94 fL (ref 79–97)
Monocytes Absolute: 0.5 x10E3/uL (ref 0.1–0.9)
Monocytes: 8 %
Neutrophils Absolute: 3.4 x10E3/uL (ref 1.4–7.0)
Neutrophils: 52 %
Platelets: 311 x10E3/uL (ref 150–450)
RBC: 3.79 x10E6/uL — ABNORMAL LOW (ref 4.14–5.80)
RDW: 13.1 % (ref 11.6–15.4)
WBC: 6.7 x10E3/uL (ref 3.4–10.8)

## 2024-07-13 LAB — MAGNESIUM: Magnesium: 1.9 mg/dL (ref 1.6–2.3)

## 2024-07-13 LAB — COMPREHENSIVE METABOLIC PANEL WITH GFR
ALT: 8 IU/L (ref 0–44)
AST: 11 IU/L (ref 0–40)
Albumin: 4.1 g/dL (ref 3.8–4.9)
Alkaline Phosphatase: 76 IU/L (ref 44–121)
BUN/Creatinine Ratio: 16 (ref 9–20)
BUN: 18 mg/dL (ref 6–24)
Bilirubin Total: 0.2 mg/dL (ref 0.0–1.2)
CO2: 22 mmol/L (ref 20–29)
Calcium: 9.3 mg/dL (ref 8.7–10.2)
Chloride: 102 mmol/L (ref 96–106)
Creatinine, Ser: 1.15 mg/dL (ref 0.76–1.27)
Globulin, Total: 3.2 g/dL (ref 1.5–4.5)
Glucose: 75 mg/dL (ref 70–99)
Potassium: 4 mmol/L (ref 3.5–5.2)
Sodium: 137 mmol/L (ref 134–144)
Total Protein: 7.3 g/dL (ref 6.0–8.5)
eGFR: 75 mL/min/1.73 (ref >59)

## 2024-07-13 LAB — LIPID PANEL W/O CHOL/HDL RATIO
Cholesterol, Total: 96 mg/dL — ABNORMAL LOW (ref 100–199)
HDL: 35 mg/dL — ABNORMAL LOW (ref >39)
LDL Chol Calc (NIH): 48 mg/dL (ref 0–99)
Triglycerides: 55 mg/dL (ref 0–149)
VLDL Cholesterol Cal: 13 mg/dL (ref 5–40)

## 2024-07-13 LAB — TSH: TSH: 2.18 u[IU]/mL (ref 0.450–4.500)

## 2024-07-13 LAB — PSA: Prostate Specific Ag, Serum: 0.4 ng/mL (ref 0.0–4.0)

## 2024-07-13 NOTE — Progress Notes (Signed)
 Virtual Visit via Video Note  I connected with James Lambert on 07/13/24 at 12:30 PM EDT by a video enabled telemedicine application and verified that I am speaking with the correct person using two identifiers.  Location: Patient: in home Provider: [address] [place], [place], [address]    Shared Decision Making Visit Lung Cancer Screening Program [phone])   Eligibility: Age 58 y.o. Pack Years Smoking History Calculation 26 (# packs/per year x # years smoked) Recent History of coughing up blood  no Unexplained weight loss? no ( >Than 15 pounds within the last 6 months ) Prior History Lung / other cancer no (Diagnosis within the last 5 years already requiring surveillance chest CT Scans). Smoking Status Current Smoker Former Smokers: Years since quit:  NA  Quit Date: NA  Visit Components: Discussion included one or more decision making aids. yes Discussion included risk/benefits of screening. yes Discussion included potential follow up diagnostic testing for abnormal scans. yes Discussion included meaning and risk of over diagnosis. yes Discussion included meaning and risk of False Positives. yes Discussion included meaning of total radiation exposure. yes  Counseling Included: Importance of adherence to annual lung cancer LDCT screening. yes Impact of comorbidities on ability to participate in the program. yes Ability and willingness to under diagnostic treatment. yes  Smoking Cessation Counseling: Current Smokers:  Discussed importance of smoking cessation. yes Information about tobacco cessation classes and interventions provided to patient. yes Patient provided with ticket for LDCT Scan. yes Symptomatic Patient. no  Counseling NA Diagnosis Code: Tobacco Use Z72.0 Asymptomatic Patient yes  Counseling (Intermediate counseling: > three minutes counseling) H9563 Former Smokers:  Discussed the importance of maintaining cigarette abstinence. yes Diagnosis  Code: Personal History of Nicotine  Dependence. S12.108 Information about tobacco cessation classes and interventions provided to patient. Yes Patient provided with ticket for LDCT Scan. yes Written Order for Lung Cancer Screening with LDCT placed in Epic. Yes (CT Chest Lung Cancer Screening Low Dose W/O CM) [id number] Z12.2-Screening of respiratory organs Z87.891-Personal history of nicotine  dependence   Josette Ranger, RN 07/13/24

## 2024-07-13 NOTE — Patient Instructions (Signed)

## 2024-07-15 ENCOUNTER — Ambulatory Visit
Admission: RE | Admit: 2024-07-15 | Discharge: 2024-07-15 | Disposition: A | Discharge status: Home / Self Care | Source: Ambulatory Visit | Attending: Acute Care | Admitting: Acute Care

## 2024-07-15 DIAGNOSIS — F17210 Nicotine dependence, cigarettes, uncomplicated: Secondary | ICD-10-CM | POA: Insufficient documentation

## 2024-07-15 DIAGNOSIS — Z122 Encounter for screening for malignant neoplasm of respiratory organs: Secondary | ICD-10-CM | POA: Insufficient documentation

## 2024-07-15 DIAGNOSIS — Z87891 Personal history of nicotine dependence: Secondary | ICD-10-CM | POA: Diagnosis present

## 2024-07-20 NOTE — Progress Notes (Signed)
 Counseled patient 3-4 minutes regarding tobacco use.

## 2024-07-29 ENCOUNTER — Other Ambulatory Visit: Payer: Self-pay

## 2024-07-29 DIAGNOSIS — F17210 Nicotine dependence, cigarettes, uncomplicated: Secondary | ICD-10-CM

## 2024-07-29 DIAGNOSIS — Z122 Encounter for screening for malignant neoplasm of respiratory organs: Secondary | ICD-10-CM

## 2024-07-29 DIAGNOSIS — Z87891 Personal history of nicotine dependence: Secondary | ICD-10-CM

## 2024-08-11 NOTE — Progress Notes (Deleted)
 NO SHOW

## 2024-08-12 ENCOUNTER — Ambulatory Visit: Attending: Cardiovascular Disease | Admitting: Cardiovascular Disease

## 2024-08-12 DIAGNOSIS — G459 Transient cerebral ischemic attack, unspecified: Secondary | ICD-10-CM

## 2024-08-12 DIAGNOSIS — I700 Atherosclerosis of aorta: Secondary | ICD-10-CM

## 2024-08-12 DIAGNOSIS — Z8673 Personal history of transient ischemic attack (TIA), and cerebral infarction without residual deficits: Secondary | ICD-10-CM

## 2024-08-12 DIAGNOSIS — F17210 Nicotine dependence, cigarettes, uncomplicated: Secondary | ICD-10-CM

## 2024-08-12 DIAGNOSIS — I10 Essential (primary) hypertension: Secondary | ICD-10-CM

## 2024-08-12 DIAGNOSIS — J439 Emphysema, unspecified: Secondary | ICD-10-CM

## 2024-08-12 DIAGNOSIS — F1010 Alcohol abuse, uncomplicated: Secondary | ICD-10-CM

## 2024-08-12 DIAGNOSIS — E782 Mixed hyperlipidemia: Secondary | ICD-10-CM

## 2024-08-15 ENCOUNTER — Ambulatory Visit: Payer: Self-pay | Admitting: Physician Assistant

## 2024-08-15 ENCOUNTER — Encounter: Payer: Self-pay | Admitting: *Deleted

## 2024-08-19 ENCOUNTER — Ambulatory Visit: Payer: Self-pay

## 2024-08-19 NOTE — Telephone Encounter (Signed)
 Noted

## 2024-08-19 NOTE — Telephone Encounter (Signed)
 FYI Only or Action Required?: FYI only for provider.  Patient was last seen in primary care on 07/12/2024 by Cannady, Jolene T, NP.  Called Nurse Triage reporting Boils.  Symptoms began several weeks ago.  Interventions attempted: Rest, hydration, or home remedies.  Symptoms are: unchanged.  Triage Disposition: See PCP When Office is Open (Within 3 Days)-patient requested an appointment for 08/24/2024-scheduled for 1:00 PM   Patient/caregiver understands and will follow disposition?: Yes  Copied from CRM #[id number]. Topic: Clinical - Red Word Triage >> Aug 19, 2024 10:43 AM Willma SAUNDERS wrote: [facility] that prompted transfer to Nurse Triage: Patient states he has some boils he needs looked at. They are draining and have an odor. Reason for Disposition  Boil > 1/2 inch across (> 12 mm; larger than a marble)  Answer Assessment - Initial Assessment Questions 1. APPEARANCE of BOIL: What does the boil look like?      Patient unsure of what they look like 2. LOCATION: Where is the boil located?      Located on patient's butt 3. NUMBER: How many boils are there?      2-3  4. SIZE: How big is the boil? (e.g., inches, cm; compare to size of a coin or other object)     About the size of a 50 cent piece 5. ONSET: When did the boil start?     Patient states he has been having problems with boils for the last few years 6. PAIN: Is there any pain? If Yes, ask: How bad is the pain?   (Scale 1-10; or mild, moderate, severe)     6 out of 10 7. FEVER: Do you have a fever? If Yes, ask: What is it, how was it measured, and when did it start?      no 8. SOURCE: Have you been around anyone with boils or other Staph infections? Have you ever had boils before?     Yes patient states he has had boils before 9. OTHER SYMPTOMS: Do you have any other symptoms? (e.g., shaking chills, weakness, rash elsewhere on body)     No  Patient requesting an appointment for next Wednesday with PCP.  Acute appointment scheduled for 08/24/2024 at 1:00 PM with PCP. Patient is encouraged to go to Urgent Care if symptoms get any worse. Patient verbalized understanding and all questions answered.  Protocols used: Boil (Skin Abscess)-A-AH

## 2024-08-19 NOTE — Telephone Encounter (Signed)
 Forwarding to PCP.

## 2024-08-21 DIAGNOSIS — I2510 Atherosclerotic heart disease of native coronary artery without angina pectoris: Secondary | ICD-10-CM | POA: Insufficient documentation

## 2024-08-21 NOTE — Patient Instructions (Incomplete)
 Hidradenitis Suppurativa    Hidradenitis suppurativa is a long-term (chronic) skin disease. It is similar to a severe form of acne, but it affects areas of the body where acne would be unusual, especially areas of the body where skin rubs against skin and becomes moist. These include:  Underarms.  Groin.  Genital area.  Buttocks.  Upper thighs.  Breasts.  Hidradenitis suppurativa may start out as small lumps or pimples caused by blocked skin pores, sweat glands, or hair follicles. Pimples may develop into deep sores that break open (rupture) and drain pus. Over time, affected areas of skin may thicken and become scarred. This condition is rare and does not spread from person to person (non-contagious).  What are the causes?  The exact cause of this condition is not known. It may be related to:  Male and male hormones.  An overactive disease-fighting system (immune system). The immune system may over-react to blocked hair follicles or sweat glands and cause swelling and pus-filled sores.  What increases the risk?  You are more likely to develop this condition if you:  Are male.  Are 51-84 years old.  Have a family history of hidradenitis suppurativa.  Have a personal history of acne.  Are overweight.  Smoke.  Take the medicine lithium.  What are the signs or symptoms?  The first symptoms are usually painful bumps in the skin, similar to pimples. The condition may get worse over time (progress), or it may only cause mild symptoms. If the disease progresses, symptoms may include:  Skin bumps getting bigger and growing deeper into the skin.  Bumps rupturing and draining pus.  Itchy, infected skin.  Skin getting thicker and scarred.  Tunnels under the skin (fistulas) where pus drains from a bump.  Pain during daily activities, such as pain during walking if your groin area is affected.  Emotional problems, such as stress or depression. This condition may affect your appearance and your ability or willingness to wear  certain clothes or do certain activities.  How is this diagnosed?  This condition is diagnosed by a health care provider who specializes in skin conditions (dermatologist). You may be diagnosed based on:  Your symptoms and medical history.  A physical exam.  Testing a pus sample for infection.  Blood tests.  How is this treated?  Your treatment will depend on how severe your symptoms are. The same treatment will not work for everybody with this condition. You may need to try several treatments to find what works best for you. Treatment may include:  Cleaning and bandaging (dressing) your wounds as needed.  Lifestyle changes, such as new skin care routines.  Taking medicines, such as:  Antibiotics.  Acne medicines.  Medicines to reduce the activity of the immune system.  A diabetes medicine (metformin).  Birth control pills, for women.  Steroids to reduce swelling and pain.  Working with a mental health care provider, if you experience emotional distress due to this condition.  If you have severe symptoms that do not get better with medicine, you may need surgery. Surgery may involve:  Using a laser to clear the skin and remove hair follicles.  Opening and draining deep sores.  Removing the areas of skin that are diseased and scarred.  Follow these instructions at home:  Medicines    Take over-the-counter and prescription medicines only as told by your health care provider.  If you were prescribed antibiotics, take them as told by your health care provider. Do not  stop using the antibiotic even if your condition improves.  Skin care  If you have open wounds, cover them with a clean dressing as told by your health care provider. Keep wounds clean by washing them gently with soap and water when you bathe.  Do not shave the areas where you get hidradenitis suppurativa.  Wear loose-fitting clothes.  Try to avoid getting overheated or sweaty. If you get sweaty or wet, change into clean, dry clothes as soon as you can.  To  help relieve pain and itchiness, cover sore areas with a warm, clean washcloth (warm compress) for 5-10 minutes as often as needed.  Your healthcare provider may recommend an antiperspirant deodorant that may be gentle on your skin.  A daily antiseptic wash to cleanse affected areas may be suggested by your healthcare provider.  General instructions  Learn as much as you can about your disease so that you have an active role in your treatment. Work closely with your health care provider to find treatments that work for you.  If you are overweight, work with your health care provider to lose weight as recommended.  Do not use any products that contain nicotine or tobacco. These products include cigarettes, chewing tobacco, and vaping devices, such as e-cigarettes. If you need help quitting, ask your health care provider.  If you struggle with living with this condition, talk with your health care provider or work with a mental health care provider as recommended.  Keep all follow-up visits.  Where to find more information  Hidradenitis Suppurativa Foundation, Inc.: www.hs-foundation.org  American Academy of Dermatology: InfoExam.si  Contact a health care provider if:  You have a flare-up of hidradenitis suppurativa.  You have a fever or chills.  You have trouble controlling your symptoms at home.  You have trouble doing your daily activities because of your symptoms.  You have trouble dealing with emotional problems related to your condition.  Summary  Hidradenitis suppurativa is a long-term (chronic) skin disease. It is similar to a severe form of acne, but it affects areas of the body where acne would be unusual.  The first symptoms are usually painful bumps in the skin, similar to pimples. The condition may only cause mild symptoms, or it may get worse over time (progress).  If you have open wounds, cover them with a clean dressing as told by your health care provider. Keep wounds clean by washing them gently with  soap and water when you bathe.  Besides skin care, treatment may include medicines, laser treatment, and surgery.  This information is not intended to replace advice given to you by your health care provider. Make sure you discuss any questions you have with your health care provider.  Document Revised: 01/15/2022 Document Reviewed: 01/15/2022  Elsevier Patient Education  2024 [employer].

## 2024-08-24 ENCOUNTER — Ambulatory Visit: Admitting: Nurse Practitioner

## 2024-08-24 ENCOUNTER — Telehealth: Payer: Self-pay

## 2024-08-24 DIAGNOSIS — D649 Anemia, unspecified: Secondary | ICD-10-CM

## 2024-08-24 DIAGNOSIS — L732 Hidradenitis suppurativa: Secondary | ICD-10-CM

## 2024-08-24 DIAGNOSIS — Z23 Encounter for immunization: Secondary | ICD-10-CM

## 2024-08-24 NOTE — Telephone Encounter (Signed)
 Ok for E2C2 to review.  Attempted to reach patient to inform of the need to reschedule his appointment today with PCP. Please assist with the reschedule if/when he returns the call.

## 2024-08-25 ENCOUNTER — Emergency Department
Admission: EM | Admit: 2024-08-25 | Discharge: 2024-08-25 | Disposition: A | Discharge status: Home / Self Care | Attending: Emergency Medicine | Admitting: Emergency Medicine

## 2024-08-25 ENCOUNTER — Encounter: Payer: Self-pay | Admitting: Intensive Care

## 2024-08-25 ENCOUNTER — Emergency Department

## 2024-08-25 ENCOUNTER — Other Ambulatory Visit: Payer: Self-pay

## 2024-08-25 DIAGNOSIS — K610 Anal abscess: Secondary | ICD-10-CM | POA: Diagnosis not present

## 2024-08-25 DIAGNOSIS — I10 Essential (primary) hypertension: Secondary | ICD-10-CM | POA: Diagnosis not present

## 2024-08-25 DIAGNOSIS — R791 Abnormal coagulation profile: Secondary | ICD-10-CM | POA: Diagnosis not present

## 2024-08-25 DIAGNOSIS — L02215 Cutaneous abscess of perineum: Secondary | ICD-10-CM

## 2024-08-25 DIAGNOSIS — J449 Chronic obstructive pulmonary disease, unspecified: Secondary | ICD-10-CM | POA: Diagnosis not present

## 2024-08-25 DIAGNOSIS — L0231 Cutaneous abscess of buttock: Secondary | ICD-10-CM | POA: Insufficient documentation

## 2024-08-25 DIAGNOSIS — R42 Dizziness and giddiness: Secondary | ICD-10-CM | POA: Diagnosis present

## 2024-08-25 LAB — COMPREHENSIVE METABOLIC PANEL WITH GFR
ALT: 8 U/L (ref 0–44)
AST: 13 U/L — ABNORMAL LOW (ref 15–41)
Albumin: 3.4 g/dL — ABNORMAL LOW (ref 3.5–5.0)
Alkaline Phosphatase: 58 U/L (ref 38–126)
Anion gap: 10 (ref 5–15)
BUN: 18 mg/dL (ref 6–20)
CO2: 24 mmol/L (ref 22–32)
Calcium: 9 mg/dL (ref 8.9–10.3)
Chloride: 105 mmol/L (ref 98–111)
Creatinine, Ser: 1.25 mg/dL — ABNORMAL HIGH (ref 0.61–1.24)
GFR, Estimated: >60 mL/min (ref >60)
Glucose, Bld: 90 mg/dL (ref 70–99)
Potassium: 3.9 mmol/L (ref 3.5–5.1)
Sodium: 139 mmol/L (ref 135–145)
Total Bilirubin: 0.7 mg/dL (ref 0.0–1.2)
Total Protein: 7.7 g/dL (ref 6.5–8.1)

## 2024-08-25 LAB — CBC WITH DIFFERENTIAL/PLATELET
Abs Immature Granulocytes: 0.07 K/uL (ref 0.00–0.07)
Basophils Absolute: 0 K/uL (ref 0.0–0.1)
Basophils Relative: 1 %
Eosinophils Absolute: 0.2 K/uL (ref 0.0–0.5)
Eosinophils Relative: 3 %
HCT: 34.4 % — ABNORMAL LOW (ref 39.0–52.0)
Hemoglobin: 11.2 g/dL — ABNORMAL LOW (ref 13.0–17.0)
Immature Granulocytes: 1 %
Lymphocytes Relative: 31 %
Lymphs Abs: 2 K/uL (ref 0.7–4.0)
MCH: 30.4 pg (ref 26.0–34.0)
MCHC: 32.6 g/dL (ref 30.0–36.0)
MCV: 93.5 fL (ref 80.0–100.0)
Monocytes Absolute: 0.5 K/uL (ref 0.1–1.0)
Monocytes Relative: 8 %
Neutro Abs: 3.7 K/uL (ref 1.7–7.7)
Neutrophils Relative %: 56 %
Platelets: 274 K/uL (ref 150–400)
RBC: 3.68 MIL/uL — ABNORMAL LOW (ref 4.22–5.81)
RDW: 13.7 % (ref 11.5–15.5)
WBC: 6.5 K/uL (ref 4.0–10.5)
nRBC: 0 % (ref 0.0–0.2)

## 2024-08-25 LAB — URINALYSIS, ROUTINE W REFLEX MICROSCOPIC
Bilirubin Urine: NEGATIVE
Glucose, UA: NEGATIVE mg/dL
Hgb urine dipstick: NEGATIVE
Ketones, ur: NEGATIVE mg/dL
Leukocytes,Ua: NEGATIVE
Nitrite: NEGATIVE
Protein, ur: NEGATIVE mg/dL
Specific Gravity, Urine: 1.017 (ref 1.005–1.030)
pH: 6 (ref 5.0–8.0)

## 2024-08-25 LAB — TROPONIN I (HIGH SENSITIVITY): Troponin I (High Sensitivity): 3 ng/L (ref <18)

## 2024-08-25 LAB — PROTIME-INR
INR: 1.1 (ref 0.8–1.2)
Prothrombin Time: 14.7 s (ref 11.4–15.2)

## 2024-08-25 MED ORDER — SULFAMETHOXAZOLE-TRIMETHOPRIM 800-160 MG PO TABS
1.0000 | ORAL_TABLET | Freq: Once | ORAL | Status: AC
  Administered 2024-08-25: 1 via ORAL
  Filled 2024-08-25: qty 1

## 2024-08-25 MED ORDER — CHLORHEXIDINE GLUCONATE 4 % EX SOLN: Freq: Every day | CUTANEOUS | 1 refills | Status: AC | PRN

## 2024-08-25 MED ORDER — SULFAMETHOXAZOLE-TRIMETHOPRIM 800-160 MG PO TABS: 1.0000 | ORAL_TABLET | Freq: Two times a day (BID) | ORAL | 0 refills | Status: AC

## 2024-08-25 NOTE — ED Triage Notes (Signed)
 Arrived by [facility] from home. C/o several boils on buttocks. Reports drainage green in color.  History boils  EMS vitals: 65HR 132/86 b/p 97% RA

## 2024-08-25 NOTE — Discharge Instructions (Addendum)
 You have been seen in the Emergency Department (ED) today for several abscesses. These would be better addressed by a general surgeon. I have provided follow-up information with them, please give them a call at your earliest convenience.  Read through the additional discharge instructions included below regarding wound care recommendations.  Keep the wounds clean and dry, though you may wash as you would normally.  Apply dressings to areas that are draining. Please pick up and take antibiotics as prescribed.  I have also ordered chlorhexidine  liquid for you to apply topically.  Call your doctor sooner or return to the ED if you develop worsening signs of infection such as: increased redness, increased pain, pus, or fever.   Abscess An abscess is an infected area that contains a collection of pus and debris. It can occur in almost any part of the body. An abscess is also known as a furuncle or boil. CAUSES  An abscess occurs when tissue gets infected. This can occur from blockage of oil or sweat glands, infection of hair follicles, or a minor injury to the skin. As the body tries to fight the infection, pus collects in the area and creates pressure under the skin. This pressure causes pain. People with weakened immune systems have difficulty fighting infections and get certain abscesses more often.  SYMPTOMS Usually an abscess develops on the skin and becomes a painful mass that is red, warm, and tender. If the abscess forms under the skin, you may feel a moveable soft area under the skin. Some abscesses break open (rupture) on their own, but most will continue to get worse without care. The infection can spread deeper into the body and eventually into the bloodstream, causing you to feel ill.  DIAGNOSIS  Your caregiver will take your medical history and perform a physical exam. A sample of fluid may also be taken from the abscess to determine what is causing your infection. TREATMENT  Your caregiver  may prescribe antibiotic medicines to fight the infection. However, taking antibiotics alone usually does not cure an abscess. Your caregiver may need to make a small cut (incision) in the abscess to drain the pus. In some cases, gauze is packed into the abscess to reduce pain and to continue draining the area. HOME CARE INSTRUCTIONS  Only take over-the-counter or prescription medicines for pain, discomfort, or fever as directed by your caregiver. If you were prescribed antibiotics, take them as directed. Finish them even if you start to feel better. If gauze is used, follow your caregiver's directions for changing the gauze. To avoid spreading the infection: Keep your draining abscess covered with a bandage. Wash your hands well. Do not share personal care items, towels, or whirlpools with others. Avoid skin contact with others. Keep your skin and clothes clean around the abscess. Keep all follow-up appointments as directed by your caregiver. SEEK MEDICAL CARE IF:  You have increased pain, swelling, redness, fluid drainage, or bleeding. You have muscle aches, chills, or a general ill feeling. You have a fever. MAKE SURE YOU:  Understand these instructions. Will watch your condition. Will get help right away if you are not doing well or get worse. Document Released: 09/03/2005 Document Revised: 05/25/2012 Document Reviewed: 02/06/2012 [facility] Patient Information 2015 [place], [place]. This information is not intended to replace advice given to you by your health care provider. Make sure you discuss any questions you have with your health care provider.  Abscess Care After An abscess (also called a boil or furuncle) is  an infected area that contains a collection of pus. Signs and symptoms of an abscess include pain, tenderness, redness, or hardness, or you may feel a moveable soft area under your skin. An abscess can occur anywhere in the body. The infection may spread to surrounding tissues  causing cellulitis. A cut (incision) by the surgeon was made over your abscess and the pus was drained out. Gauze may have been packed into the space to provide a drain that will allow the cavity to heal from the inside outwards. The boil may be painful for 5 to 7 days. Most people with a boil do not have high fevers. Your abscess, if seen early, may not have localized, and may not have been lanced. If not, another appointment may be required for this if it does not get better on its own or with medications. HOME CARE INSTRUCTIONS  Only take over-the-counter or prescription medicines for pain, discomfort, or fever as directed by your caregiver. When you bathe, soak and then remove gauze or iodoform packs at least daily or as directed by your caregiver. You may then wash the wound gently with mild soapy water. Repack with gauze or do as your caregiver directs. SEEK IMMEDIATE MEDICAL CARE IF:  You develop increased pain, swelling, redness, drainage, or bleeding in the wound site. You develop signs of generalized infection including muscle aches, chills, fever, or a general ill feeling. An oral temperature above 102 F (38.9 C) develops, not controlled by medication. See your caregiver for a recheck if you develop any of the symptoms described above. If medications (antibiotics) were prescribed, take them as directed. Document Released: 06/12/2005 Document Revised: 02/16/2012 Document Reviewed: 02/07/2008 [facility] Patient Information 2015 [place], [place]. This information is not intended to replace advice given to you by your health care provider. Make sure you discuss any questions you have with your health care provider.  Cellulitis Cellulitis is an infection of the skin and the tissue beneath it. The infected area is usually red and tender. Cellulitis occurs most often in the arms and lower legs.  CAUSES  Cellulitis is caused by bacteria that enter the skin through cracks or cuts in the skin. The  most common types of bacteria that cause cellulitis are staphylococci and streptococci. SIGNS AND SYMPTOMS  Redness and warmth. Swelling. Tenderness or pain. Fever. DIAGNOSIS  Your health care provider can usually determine what is wrong based on a physical exam. Blood tests may also be done. TREATMENT  Treatment usually involves taking an antibiotic medicine. HOME CARE INSTRUCTIONS  Take your antibiotic medicine as directed by your health care provider. Finish the antibiotic even if you start to feel better. Keep the infected arm or leg elevated to reduce swelling. Apply a warm cloth to the affected area up to 4 times per day to relieve pain. Take medicines only as directed by your health care provider. Keep all follow-up visits as directed by your health care provider. SEEK MEDICAL CARE IF:  You notice red streaks coming from the infected area. Your red area gets larger or turns dark in color. Your bone or joint underneath the infected area becomes painful after the skin has healed. Your infection returns in the same area or another area. You notice a swollen bump in the infected area. You develop new symptoms. You have a fever. SEEK IMMEDIATE MEDICAL CARE IF:  You feel very sleepy. You develop vomiting or diarrhea. You have a general ill feeling (malaise) with muscle aches and pains. MAKE SURE YOU:  Understand these instructions. Will watch your condition. Will get help right away if you are not doing well or get worse. Document Released: 09/03/2005 Document Revised: 04/10/2014 Document Reviewed: 02/09/2012 [facility] Patient Information 2015 [place], [place]. This information is not intended to replace advice given to you by your health care provider. Make sure you discuss any questions you have with your health care provider.

## 2024-08-25 NOTE — ED Provider Notes (Signed)
 [facility] Provider Note    Event Date/Time   First MD Initiated Contact with Patient 08/25/24 1521     (approximate)   History   Recurrent Skin Infections   HPI  James Lambert is a 58 y.o. male  with a past medical history of seizure, CVA, hemiparesis affecting right side, COPD, hidradenitis suppurativa, HTN, HLD, anemia presents to the emergency department with lightheadedness and dizziness x 3 days and several boils to his buttocks and underneath his testicles x 2-3 weeks.  Patient denies fever, chills, chest pain, SOB, headache, hitting his head, fall, injury, abdominal pain, nausea, vomiting.  No recent illness. States he has had trouble using his right hand since the stroke; hx of hemiparesis affective right side from CVA as mentioned above.  Has not seen his primary care doctor for this concern recently.  Is unsure if he has been using his chlorhexidine  topical liquid for his abscesses. Patient is on Plavix . He has attempted treatment of prior buttocks abscesses in the ED and had to stop mid-procedure due to intense pain.    Physical Exam   Triage Vital Signs: ED Triage Vitals  Encounter Vitals Group     BP 08/25/24 1329 112/82     Girls Systolic BP Percentile --      Girls Diastolic BP Percentile --      Boys Systolic BP Percentile --      Boys Diastolic BP Percentile --      Pulse Rate 08/25/24 1329 66     Resp 08/25/24 1329 16     Temp 08/25/24 1329 98.7 F (37.1 C)     Temp Source 08/25/24 1329 Oral     SpO2 08/25/24 1329 100 %     Weight 08/25/24 1328 225 lb (102.1 kg)     Height 08/25/24 1328 6' 2 (1.88 m)     Head Circumference --      Peak Flow --      Pain Score 08/25/24 1328 8     Pain Loc --      Pain Education --      Exclude from Growth Chart --     Most recent vital signs: Vitals:   08/25/24 1329 08/25/24 1849  BP: 112/82 122/87  Pulse: 66 71  Resp: 16 18  Temp: 98.7 F (37.1 C)   SpO2: 100% 100%    General:  Awake, in no acute distress.  Head: Normocephalic, atraumatic. Eyes: PERRLA. EOMs intact. No scleral icterus or conjunctival injection. Neck: Supple, no nuchal rigidity. CV: Good peripheral perfusion. Radial pulses 2+ b/l. Regular rate, 66 bpm. No edema.  Respiratory:Normal respiratory effort.  No respiratory distress. CTAB.  GI: Soft, non-distended, non-tender.  MSK: Normal ROM and  5/5 strength in b/l upper and lower extremities.  Skin:Warm, dry, intact. Fluctuance and several indurated areas to the left buttocks and in the perineal body just below the testicles with mild TTP of these areas. Chaperone RN present for exam.  Neurological: A&Ox4 to person, place, time, and situation. Cranial nerves II-XII grossly intact. Sensation intact. Strength symmetric.   ED Results / Procedures / Treatments   Labs (all labs ordered are listed, but only abnormal results are displayed) Labs Reviewed  COMPREHENSIVE METABOLIC PANEL WITH GFR - Abnormal; Notable for the following components:      Result Value   Creatinine, Ser 1.25 (*)    Albumin 3.4 (*)    AST 13 (*)    All other components within normal  limits  URINALYSIS, ROUTINE W REFLEX MICROSCOPIC - Abnormal; Notable for the following components:   Color, Urine YELLOW (*)    APPearance CLEAR (*)    All other components within normal limits  CBC WITH DIFFERENTIAL/PLATELET - Abnormal; Notable for the following components:   RBC 3.68 (*)    Hemoglobin 11.2 (*)    HCT 34.4 (*)    All other components within normal limits  PROTIME-INR  CBG MONITORING, ED  TROPONIN I (HIGH SENSITIVITY)     EKG  Sinus rhythm w/ 1st degree av block  Rate: 70 bpm Axis: positive PR Interval: prolonged, 228 ms QRS Complex: 94 ms QT: 402 ms  RADIOLOGY CXR and CT head ordered.   PROCEDURES:  Critical Care performed: No   Procedures   MEDICATIONS ORDERED IN ED: Medications  sulfamethoxazole -trimethoprim  (BACTRIM  DS) 800-160 MG per tablet 1 tablet (1  tablet Oral Given 08/25/24 1848)     IMPRESSION / MDM / ASSESSMENT AND PLAN / ED COURSE  I reviewed the triage vital signs and the nursing notes.                              Differential diagnosis includes, but is not limited to, hidradenitis supportiva, buttocks abscess, cellulitis, electrolyte abnormality, anemia, ACS  Patient's presentation is most consistent with acute complicated illness / injury requiring diagnostic workup.  Patient here for dizziness,, lightheadedness, several boils to his buttocks. Patient's VS all within normal range. No current chest pain or shortness of breath.  CMP with mildly elevated creatinine of 1.25.  CBC with hemoglobin 11.2, HCT 34.4. UA normal, no nitrites or leukocytes.  PT/INR reassuring, troponin 3.  EKG with sinus rhythm with first-degree AV block.  CT head and chest x-ray ordered.  Chest x-ray without any active cardiopulmonary disease.  CT of the head with chronic ischemia with multiple old infarcts, no acute abnormality.  Due to the extensive nature of the patient's hidradenitis suppurativa / abscesses on his buttocks as well as his intolerance for pain both with the IV placement and prior unsuccessful attempt at I&D in the ER for the same concern regarding anesthesia and on Plavix , believe it is best for him to follow up with general surgery to get these areas drained surgically; he has done well with this option in the past per review of his chart with surgery completed in Dec 2023 with Dr. Desiderio. Will send patient home on bactrim  after giving him a dose here and give f/u information with general surgery. Also sent another Rx for his chlorhexidine  liquid to place on the affected areas.  The patient may return to the emergency department for any new, worsening, or concerning symptoms. Patient was given the opportunity to ask questions; all questions were answered. Emergency department return precautions were discussed with the patient.  Patient is in  agreement to the treatment plan.  Patient is stable for discharge.   FINAL CLINICAL IMPRESSION(S) / ED DIAGNOSES   Final diagnoses:  Dizziness  Intermittent lightheadedness  Abscess of buttock  Perineal abscess, superficial     Rx / DC Orders   ED Discharge Orders          Ordered    chlorhexidine  (HIBICLENS ) 4 % external liquid  Daily PRN        08/25/24 1804    sulfamethoxazole -trimethoprim  (BACTRIM  DS) 800-160 MG tablet  2 times daily        08/25/24 1804  Note:  This document was prepared using Dragon voice recognition software and may include unintentional dictation errors.     Sheron Salm, PA-C 08/25/24 2312    Floy Roberts, MD 08/26/24 [phone]

## 2024-08-25 NOTE — ED Notes (Addendum)
 See triage note   Presents with possible abscess area to buttocks  Hx of same Pt has couple of open sore areas noted to buttocks  Some drainage noted

## 2024-08-26 ENCOUNTER — Telehealth: Payer: Self-pay

## 2024-08-26 NOTE — Telephone Encounter (Signed)
 Copied from CRM [id number]. Topic: Clinical - Medical Advice >> Aug 26, 2024  9:06 AM Gustabo D wrote: Pt is needing to contact info to a place his pcp sent him to he don't know the name

## 2024-08-27 NOTE — Patient Instructions (Incomplete)
 Hidradenitis Suppurativa    Hidradenitis suppurativa is a long-term (chronic) skin disease. It is similar to a severe form of acne, but it affects areas of the body where acne would be unusual, especially areas of the body where skin rubs against skin and becomes moist. These include:  Underarms.  Groin.  Genital area.  Buttocks.  Upper thighs.  Breasts.  Hidradenitis suppurativa may start out as small lumps or pimples caused by blocked skin pores, sweat glands, or hair follicles. Pimples may develop into deep sores that break open (rupture) and drain pus. Over time, affected areas of skin may thicken and become scarred. This condition is rare and does not spread from person to person (non-contagious).  What are the causes?  The exact cause of this condition is not known. It may be related to:  Male and male hormones.  An overactive disease-fighting system (immune system). The immune system may over-react to blocked hair follicles or sweat glands and cause swelling and pus-filled sores.  What increases the risk?  You are more likely to develop this condition if you:  Are male.  Are 51-84 years old.  Have a family history of hidradenitis suppurativa.  Have a personal history of acne.  Are overweight.  Smoke.  Take the medicine lithium.  What are the signs or symptoms?  The first symptoms are usually painful bumps in the skin, similar to pimples. The condition may get worse over time (progress), or it may only cause mild symptoms. If the disease progresses, symptoms may include:  Skin bumps getting bigger and growing deeper into the skin.  Bumps rupturing and draining pus.  Itchy, infected skin.  Skin getting thicker and scarred.  Tunnels under the skin (fistulas) where pus drains from a bump.  Pain during daily activities, such as pain during walking if your groin area is affected.  Emotional problems, such as stress or depression. This condition may affect your appearance and your ability or willingness to wear  certain clothes or do certain activities.  How is this diagnosed?  This condition is diagnosed by a health care provider who specializes in skin conditions (dermatologist). You may be diagnosed based on:  Your symptoms and medical history.  A physical exam.  Testing a pus sample for infection.  Blood tests.  How is this treated?  Your treatment will depend on how severe your symptoms are. The same treatment will not work for everybody with this condition. You may need to try several treatments to find what works best for you. Treatment may include:  Cleaning and bandaging (dressing) your wounds as needed.  Lifestyle changes, such as new skin care routines.  Taking medicines, such as:  Antibiotics.  Acne medicines.  Medicines to reduce the activity of the immune system.  A diabetes medicine (metformin).  Birth control pills, for women.  Steroids to reduce swelling and pain.  Working with a mental health care provider, if you experience emotional distress due to this condition.  If you have severe symptoms that do not get better with medicine, you may need surgery. Surgery may involve:  Using a laser to clear the skin and remove hair follicles.  Opening and draining deep sores.  Removing the areas of skin that are diseased and scarred.  Follow these instructions at home:  Medicines    Take over-the-counter and prescription medicines only as told by your health care provider.  If you were prescribed antibiotics, take them as told by your health care provider. Do not  stop using the antibiotic even if your condition improves.  Skin care  If you have open wounds, cover them with a clean dressing as told by your health care provider. Keep wounds clean by washing them gently with soap and water when you bathe.  Do not shave the areas where you get hidradenitis suppurativa.  Wear loose-fitting clothes.  Try to avoid getting overheated or sweaty. If you get sweaty or wet, change into clean, dry clothes as soon as you can.  To  help relieve pain and itchiness, cover sore areas with a warm, clean washcloth (warm compress) for 5-10 minutes as often as needed.  Your healthcare provider may recommend an antiperspirant deodorant that may be gentle on your skin.  A daily antiseptic wash to cleanse affected areas may be suggested by your healthcare provider.  General instructions  Learn as much as you can about your disease so that you have an active role in your treatment. Work closely with your health care provider to find treatments that work for you.  If you are overweight, work with your health care provider to lose weight as recommended.  Do not use any products that contain nicotine or tobacco. These products include cigarettes, chewing tobacco, and vaping devices, such as e-cigarettes. If you need help quitting, ask your health care provider.  If you struggle with living with this condition, talk with your health care provider or work with a mental health care provider as recommended.  Keep all follow-up visits.  Where to find more information  Hidradenitis Suppurativa Foundation, Inc.: www.hs-foundation.org  American Academy of Dermatology: InfoExam.si  Contact a health care provider if:  You have a flare-up of hidradenitis suppurativa.  You have a fever or chills.  You have trouble controlling your symptoms at home.  You have trouble doing your daily activities because of your symptoms.  You have trouble dealing with emotional problems related to your condition.  Summary  Hidradenitis suppurativa is a long-term (chronic) skin disease. It is similar to a severe form of acne, but it affects areas of the body where acne would be unusual.  The first symptoms are usually painful bumps in the skin, similar to pimples. The condition may only cause mild symptoms, or it may get worse over time (progress).  If you have open wounds, cover them with a clean dressing as told by your health care provider. Keep wounds clean by washing them gently with  soap and water when you bathe.  Besides skin care, treatment may include medicines, laser treatment, and surgery.  This information is not intended to replace advice given to you by your health care provider. Make sure you discuss any questions you have with your health care provider.  Document Revised: 01/15/2022 Document Reviewed: 01/15/2022  Elsevier Patient Education  2024 [employer].

## 2024-08-29 ENCOUNTER — Ambulatory Visit: Admitting: Nurse Practitioner

## 2024-08-29 DIAGNOSIS — D649 Anemia, unspecified: Secondary | ICD-10-CM

## 2024-08-29 DIAGNOSIS — Z23 Encounter for immunization: Secondary | ICD-10-CM

## 2024-08-29 DIAGNOSIS — F1010 Alcohol abuse, uncomplicated: Secondary | ICD-10-CM

## 2024-08-29 DIAGNOSIS — L732 Hidradenitis suppurativa: Secondary | ICD-10-CM

## 2024-08-31 NOTE — Telephone Encounter (Signed)
 Ok for E2C2 to review.   3 attempts have been made to reach patient to get more details about this message. If patient returns call please try an determine more details, what visit things were discussed.

## 2024-09-05 ENCOUNTER — Encounter: Payer: Self-pay | Admitting: Surgery

## 2024-09-05 ENCOUNTER — Ambulatory Visit: Admitting: Surgery

## 2024-09-05 VITALS — BP 131/88 | HR 73 | Temp 98.3°F | Ht 74.0 in | Wt 229.0 lb

## 2024-09-05 DIAGNOSIS — L732 Hidradenitis suppurativa: Secondary | ICD-10-CM | POA: Diagnosis not present

## 2024-09-05 MED ORDER — AMOXICILLIN-POT CLAVULANATE 875-125 MG PO TABS: 1.0000 | ORAL_TABLET | Freq: Two times a day (BID) | ORAL | 0 refills | Status: AC

## 2024-09-05 NOTE — Progress Notes (Unsigned)
 09/05/2024  History of Present Illness: James Lambert is a 58 y.o. male presenting for evaluation of hidradenitis.  Patient has a extensive history of hidradenitis and he had I&D of bilateral buttocks in the left thigh/perineum area on 11/13/2022.  He has had different episodes of flareups that has been treated with oral antibiotics.  He most recently went to the emergency room on 08/25/2024 complaining of dizziness as well as bilateral buttocks abscesses.  He was given a course of Bactrim  which the patient has completed.  Today, the patient reports that he still having pain but is mostly located in the perineal area.  He reports drainage and foul order in that area.  Denies any fevers or chills, chest pain, shortness of breath.  Of note the patient is on Plavix  for history of prior CVA.  On his last visit with us  2 months ago, we had sent a referral to dermatology but this was canceled as they were not taking new patients.  A prior referral that he had with [facility] dermatology, the patient did not show up for his visit and never called to reschedule.  Past Medical History: Past Medical History:  Diagnosis Date   Alcohol abuse    Anemia    Aortic atherosclerosis    Asthma    COPD (chronic obstructive pulmonary disease) (HCC)    CVA (cerebral vascular accident) (HCC) 05/10/2022   right side weakness   GERD (gastroesophageal reflux disease)    Hemiparesis affecting right side as late effect of stroke (HCC)    Hidradenitis suppurativa    diagnosed in [facility] ED based on history and physical exam   HLD (hyperlipidemia)    Hypertension    Marijuana use, continuous    Seizure cerebral (HCC)    Sleep apnea    does not use cpap   Tobacco use      Past Surgical History: Past Surgical History:  Procedure Laterality Date   INCISION AND DRAINAGE ABSCESS Bilateral 11/13/2022   Procedure: INCISION AND DRAINAGE ABSCESS bilateral buttocks & left groin;  Surgeon: Desiderio Schanz, MD;  Location: [facility] ORS;   Service: General;  Laterality: Bilateral;   IR CT HEAD LTD  05/10/2022   IR FLUORO GUIDED NEEDLE PLC ASPIRATION/INJECTION LOC  03/20/2022   IR PERCUTANEOUS ART THROMBECTOMY/INFUSION INTRACRANIAL INC DIAG ANGIO  05/10/2022   IR US  GUIDE VASC ACCESS RIGHT  05/10/2022   RADIOLOGY WITH ANESTHESIA N/A 05/10/2022   Procedure: IR WITH ANESTHESIA;  Surgeon: Dolphus Carrion, MD;  Location: [facility] OR;  Service: Radiology;  Laterality: N/A;   TEE WITHOUT CARDIOVERSION N/A 03/11/2022   Procedure: TRANSESOPHAGEAL ECHOCARDIOGRAM (TEE);  Surgeon: Gaspar Wolm PARAS, MD;  Location: [facility] ORS;  Service: Cardiovascular;  Laterality: N/A;    Home Medications: Prior to Admission medications   Medication Sig Start Date End Date Taking? Authorizing Provider  amoxicillin -clavulanate (AUGMENTIN ) 875-125 MG tablet Take 1 tablet by mouth 2 (two) times daily for 10 days. 09/05/24 09/15/24 Yes Janann Boeve, Schanz, MD  acetaminophen  (TYLENOL ) 500 MG tablet Take 2 tablets (1,000 mg total) by mouth every 6 (six) hours as needed. 05/29/24 05/29/25  Clarine Ozell LABOR, MD  albuterol  (VENTOLIN  HFA) 108 (90 Base) MCG/ACT inhaler Inhale 1-2 puffs into the lungs every 6 (six) hours as needed for wheezing or shortness of breath. 03/17/24   Cannady, Jolene T, NP  amLODipine  (NORVASC ) 10 MG tablet Take 1 tablet (10 mg total) by mouth daily. 07/12/24   Cannady, Jolene T, NP  aspirin  EC 81 MG tablet Take 1  tablet (81 mg total) by mouth daily. Swallow whole. 06/30/24 09/28/24  Alexander, Natalie, DO  atorvastatin  (LIPITOR ) 80 MG tablet Take 1 tablet (80 mg total) by mouth daily. 07/12/24   Cannady, Jolene T, NP  chlorhexidine  (HIBICLENS ) 4 % external liquid Apply topically daily as needed for up to 14 days. 08/25/24 09/08/24  Dunlap, Summer, PA-C  clopidogrel  (PLAVIX ) 75 MG tablet Take 1 tablet (75 mg total) by mouth daily. 07/12/24   Cannady, Jolene T, NP  cyanocobalamin  1000 MCG tablet Take 1 tablet (1,000 mcg total) by mouth daily. 06/30/24   Alexander, Natalie, DO   metoprolol  succinate (TOPROL -XL) 50 MG 24 hr tablet Take 1 tablet (50 mg total) by mouth daily with or immediately following a meal. 07/12/24   Cannady, Jolene T, NP  Multiple Vitamin (MULTIVITAMIN WITH MINERALS) TABS tablet Take 1 tablet by mouth daily. 05/27/22   Setzer, Sandra J, PA-C  mupirocin  ointment (BACTROBAN ) 2 % Apply 1 Application topically 2 (two) times daily. 03/17/24   Cannady, Jolene T, NP  olmesartan -hydrochlorothiazide  (BENICAR  HCT) 40-12.5 MG tablet Take 1 tablet by mouth daily. 03/17/24   Cannady, Jolene T, NP  pantoprazole  (PROTONIX ) 40 MG tablet Take 1 tablet (40 mg total) by mouth at bedtime. 07/12/24   Cannady, Jolene T, NP  thiamine  (VITAMIN B-1) 100 MG tablet Take 1 tablet (100 mg total) by mouth daily. 07/12/24   Cannady, Jolene T, NP  tiZANidine  (ZANAFLEX ) 2 MG tablet Take 1 tablet (2 mg total) by mouth at bedtime. May also take 1 tablet (2 mg total) daily as needed for muscle spasms. 06/29/24   Alexander, Natalie, DO  umeclidinium-vilanterol (ANORO ELLIPTA ) 62.5-25 MCG/ACT AEPB Inhale 1 puff into the lungs daily at 6 (six) AM. 03/17/24   Valerio Moris T, NP    Allergies: No Known Allergies  Review of Systems: Review of Systems  Constitutional:  Negative for chills and fever.  Respiratory:  Negative for shortness of breath.   Cardiovascular:  Negative for chest pain.  Gastrointestinal:  Negative for nausea and vomiting.  Skin:        Drainage from perineum, foul odor    Physical Exam BP 131/88   Pulse 73   Temp 98.3 F (36.8 C) (Oral)   Ht 6' 2 (1.88 m)   Wt 229 lb (103.9 kg)   SpO2 97%   BMI 29.40 kg/m  CONSTITUTIONAL: No acute distress HEENT:  Normocephalic, atraumatic, extraocular motion intact. RESPIRATORY:  Normal respiratory effort without pathologic use of accessory muscles. CARDIOVASCULAR: Regular rhythm and rate SKIN: Bilateral buttocks has evidence of prior I&D areas throughout both buttocks.  There is palpable firmness in both locations but no  evidence of any active flareups of hidradenitis.  Particular there is no areas of drainage or tenderness.  In the perineum, the patient does have an open wound at the midportion of the perineum between anal verge and base of the scrotum.  This measures about 1.5 cm.  There is mild purulence when significantly pushing in this area that can be expressed. NEUROLOGIC:  Motor and sensation is grossly normal.  Cranial nerves are grossly intact. PSYCH:  Alert and oriented to person, place and time. Affect is normal.  Assessment and Plan: This is a 58 y.o. male with hidradenitis flareup in the perineum.  - Discussed with patient that we would attempt to treat this with antibiotics like his previous flareups.  In the past Augmentin  has worked better for him and given cultures in the past, Bactrim  is likely not  to be as effective.  Will prescribe him a 10-day course of Augmentin . - Discussed with patient again the importance of seeing dermatology in order to continue maintenance therapy for his hidradenitis.  Discussed with him that to do an excision of the areas involved would be a quite extensive surgery and for now we would rather try to maintain him conservatively.  We will send a new referral to a different dermatology group given that this previous one was not taking new patients.  I have asked the patient to let us  know if there is any troubles with this referral so we can contact them or switch to different group.  I spent 20 minutes dedicated to the care of this patient on the date of this encounter to include pre-visit review of records, face-to-face time with the patient discussing diagnosis and management, and any post-visit coordination of care.   Aloysius Sheree Plant, MD [facility]

## 2024-09-05 NOTE — Patient Instructions (Addendum)
 We will send a referral to a dermatology (Dr Alm Murdoch office), they will call you with an appointment. If you do not hear from them by the end of the week please give our office a call   Hidradenitis Suppurativa Hidradenitis suppurativa is a long-term (chronic) skin disease. It is similar to a severe form of acne, but it affects areas of the body where acne would be unusual, especially areas of the body where skin rubs against skin and becomes moist. These include: Underarms. Groin. Genital area. Buttocks. Upper thighs. Breasts. Hidradenitis suppurativa may start out as small lumps or pimples caused by blocked skin pores, sweat glands, or hair follicles. Pimples may develop into deep sores that break open (rupture) and drain pus. Over time, affected areas of skin may thicken and become scarred. This condition is rare and does not spread from person to person (non-contagious). What are the causes? The exact cause of this condition is not known. It may be related to: Male and male hormones. An overactive disease-fighting system (immune system). The immune system may over-react to blocked hair follicles or sweat glands and cause swelling and pus-filled sores. What increases the risk? You are more likely to develop this condition if you: Are male. Are 92-68 years old. Have a family history of hidradenitis suppurativa. Have a personal history of acne. Are overweight. Smoke. Take the medicine lithium. What are the signs or symptoms? The first symptoms are usually painful bumps in the skin, similar to pimples. The condition may get worse over time (progress), or it may only cause mild symptoms. If the disease progresses, symptoms may include: Skin bumps getting bigger and growing deeper into the skin. Bumps rupturing and draining pus. Itchy, infected skin. Skin getting thicker and scarred. Tunnels under the skin (fistulas) where pus drains from a bump. Pain during daily activities,  such as pain during walking if your groin area is affected. Emotional problems, such as stress or depression. This condition may affect your appearance and your ability or willingness to wear certain clothes or do certain activities. How is this diagnosed? This condition is diagnosed by a health care provider who specializes in skin conditions (dermatologist). You may be diagnosed based on: Your symptoms and medical history. A physical exam. Testing a pus sample for infection. Blood tests. How is this treated? Your treatment will depend on how severe your symptoms are. The same treatment will not work for everybody with this condition. You may need to try several treatments to find what works best for you. Treatment may include: Cleaning and bandaging (dressing) your wounds as needed. Lifestyle changes, such as new skin care routines. Taking medicines, such as: Antibiotics. Acne medicines. Medicines to reduce the activity of the immune system. A diabetes medicine (metformin). Birth control pills, for women. Steroids to reduce swelling and pain. Working with a mental health care provider, if you experience emotional distress due to this condition. If you have severe symptoms that do not get better with medicine, you may need surgery. Surgery may involve: Using a laser to clear the skin and remove hair follicles. Opening and draining deep sores. Removing the areas of skin that are diseased and scarred. Follow these instructions at home: Medicines  Take over-the-counter and prescription medicines only as told by your health care provider. If you were prescribed antibiotics, take them as told by your health care provider. Do not stop using the antibiotic even if your condition improves. Skin care If you have open wounds, cover them with  a clean dressing as told by your health care provider. Keep wounds clean by washing them gently with soap and water when you bathe. Do not shave the areas  where you get hidradenitis suppurativa. Wear loose-fitting clothes. Try to avoid getting overheated or sweaty. If you get sweaty or wet, change into clean, dry clothes as soon as you can. To help relieve pain and itchiness, cover sore areas with a warm, clean washcloth (warm compress) for 5-10 minutes as often as needed. Your healthcare provider may recommend an antiperspirant deodorant that may be gentle on your skin. A daily antiseptic wash to cleanse affected areas may be suggested by your healthcare provider. General instructions Learn as much as you can about your disease so that you have an active role in your treatment. Work closely with your health care provider to find treatments that work for you. If you are overweight, work with your health care provider to lose weight as recommended. Do not use any products that contain nicotine  or tobacco. These products include cigarettes, chewing tobacco, and vaping devices, such as e-cigarettes. If you need help quitting, ask your health care provider. If you struggle with living with this condition, talk with your health care provider or work with a mental health care provider as recommended. Keep all follow-up visits. Where to find more information Hidradenitis Suppurativa Foundation, Inc.: www.hs-foundation.org American Academy of Dermatology: InfoExam.si Contact a health care provider if: You have a flare-up of hidradenitis suppurativa. You have a fever or chills. You have trouble controlling your symptoms at home. You have trouble doing your daily activities because of your symptoms. You have trouble dealing with emotional problems related to your condition. Summary Hidradenitis suppurativa is a long-term (chronic) skin disease. It is similar to a severe form of acne, but it affects areas of the body where acne would be unusual. The first symptoms are usually painful bumps in the skin, similar to pimples. The condition may only cause mild  symptoms, or it may get worse over time (progress). If you have open wounds, cover them with a clean dressing as told by your health care provider. Keep wounds clean by washing them gently with soap and water when you bathe. Besides skin care, treatment may include medicines, laser treatment, and surgery. This information is not intended to replace advice given to you by your health care provider. Make sure you discuss any questions you have with your health care provider. Document Revised: 01/15/2022 Document Reviewed: 01/15/2022 Elsevier Patient Education  2024 [employer].

## 2024-09-07 NOTE — Progress Notes (Deleted)
  Cardiology Office Note   Date:  09/07/2024  ID:  James Lambert, James Lambert 1966-01-03, MRN [id number] PCP: [name], NP  [facility] Providers Cardiologist:  None { Click to update primary MD,subspecialty MD or APP then REFRESH:1}    History of Present Illness JAG LENZ is a 58 y.o. male PMH HTN, HLD, COPD, prior cryptogenic stroke (2023) who presents for further evaluation management of first-degree AV block.  ***. Last LDL 48 07/2024.  Relevant CVD History - CT chest w/o contrast 08/06/24 with dense CAC LAD and aortic atherosclerosis  - 14-day monitor 08/12/2024 mean heart rate 68 bpm, rare ectopy, no arrhythmias - TTE 06/29/2024 LVEF 45 to 50% with global hypokinesis and grade 1 diastolic dysfunction.  Normal RV size and function with normal RVSP.  No significant valvular disease and negative bubble study. - Low risk SPECT 06/2022 - TTE 05/2022 normal biventricular function and no significant valvular disease - TEE 03/2022 with normal biventricular function and evidence of possible small PFO and positive bubble study   ROS: Pt denies any chest discomfort, jaw pain, arm pain, palpitations, syncope, presyncope, orthopnea, PND, or LE edema.  Studies Reviewed I have independently reviewed the patient's ECG, previous medical records, recent blood work, and previous cardiac testing.  Physical Exam VS:  There were no vitals taken for this visit.       Wt Readings from Last 3 Encounters:  09/05/24 229 lb (103.9 kg)  08/25/24 225 lb (102.1 kg)  07/12/24 247 lb (112 kg)    GEN: No acute distress. NECK: No JVD; No carotid bruits. CARDIAC: ***RRR, no murmurs, rubs, gallops. RESPIRATORY:  Clear to auscultation. EXTREMITIES:  Warm and well-perfused. No edema.  ASSESSMENT AND PLAN Heart failure with mildly reduced ejection fraction CAC Aortic atherosclerosis 1st degree AVB        {Are you ordering a CV Procedure (e.g. stress test, cath, DCCV, TEE, etc)?   Press F2         :[id number]}  Dispo: ***  Signed, Caron Poser, MD

## 2024-09-09 ENCOUNTER — Ambulatory Visit

## 2024-09-27 ENCOUNTER — Emergency Department

## 2024-09-27 ENCOUNTER — Other Ambulatory Visit: Payer: Self-pay

## 2024-09-27 ENCOUNTER — Emergency Department
Admission: EM | Admit: 2024-09-27 | Discharge: 2024-09-27 | Disposition: A | Discharge status: Home / Self Care | Attending: Emergency Medicine | Admitting: Emergency Medicine

## 2024-09-27 DIAGNOSIS — Z720 Tobacco use: Secondary | ICD-10-CM | POA: Diagnosis not present

## 2024-09-27 DIAGNOSIS — I10 Essential (primary) hypertension: Secondary | ICD-10-CM | POA: Insufficient documentation

## 2024-09-27 DIAGNOSIS — M25561 Pain in right knee: Secondary | ICD-10-CM | POA: Insufficient documentation

## 2024-09-27 DIAGNOSIS — M25562 Pain in left knee: Secondary | ICD-10-CM | POA: Insufficient documentation

## 2024-09-27 DIAGNOSIS — J4489 Other specified chronic obstructive pulmonary disease: Secondary | ICD-10-CM | POA: Insufficient documentation

## 2024-09-27 DIAGNOSIS — G8929 Other chronic pain: Secondary | ICD-10-CM

## 2024-09-27 MED ORDER — DEXAMETHASONE SOD PHOSPHATE PF 10 MG/ML IJ SOLN
10.0000 mg | Freq: Once | INTRAMUSCULAR | Status: AC
  Administered 2024-09-27: 10 mg via INTRAMUSCULAR

## 2024-09-27 MED ORDER — KETOROLAC TROMETHAMINE 30 MG/ML IJ SOLN
30.0000 mg | Freq: Once | INTRAMUSCULAR | Status: AC
  Administered 2024-09-27: 30 mg via INTRAMUSCULAR
  Filled 2024-09-27: qty 1

## 2024-09-27 MED ORDER — MELOXICAM 15 MG PO TABS: 15.0000 mg | ORAL_TABLET | Freq: Every day | ORAL | 0 refills | Status: AC

## 2024-09-27 MED ORDER — ACETAMINOPHEN 325 MG PO TABS
650.0000 mg | ORAL_TABLET | Freq: Once | ORAL | Status: AC
  Administered 2024-09-27: 650 mg via ORAL
  Filled 2024-09-27: qty 2

## 2024-09-27 NOTE — ED Triage Notes (Signed)
 Pt to ED via [facility] for c/o bilateral knee pain. Pt states he has no cartilage in his knees. Pt goes to [facility], but he does not have an appointment until November.

## 2024-09-27 NOTE — ED Notes (Signed)
 First Nurse Note: Pt to ED via [facility] from home for bilateral knee pain x 3 days and also weakness in legs. Pt has hx/o past CVA, stroke screen negative with EMS. VSS

## 2024-09-27 NOTE — Discharge Instructions (Signed)
 You refused images of your knees during this ED visit.  Please follow-up with orthopedic for further management.  Use the walker provided to help assist you with walking.  Alternate ice and heat over the affected area.   Pain control:  Ibuprofen (motrin/aleve karolyn) - You can take 3 tablets (600 mg) every 6 hours as needed for pain/fever.  Acetaminophen  (tylenol ) - You can take 2 extra strength tablets (1000 mg) every 6 hours as needed for pain/fever.  You can alternate these medications or take them together.  Make sure you eat food/drink water when taking these medications.

## 2024-09-27 NOTE — ED Provider Notes (Signed)
 [facility] Emergency Department Provider Note     Event Date/Time   First MD Initiated Contact with Patient 09/27/24 1650     (approximate)   History   Knee Pain   HPI  James Lambert is a 58 y.o. male with a past medical history of HTN, asthma, COPD, tobacco use presents to the ED with nontraumatic complaint of bilateral knee pain.  Patient denies injury or falls.  Has taken Tylenol  with some improvement.  No other complaint.     Physical Exam   Triage Vital Signs: ED Triage Vitals  Encounter Vitals Group     BP 09/27/24 1623 136/83     Girls Systolic BP Percentile --      Girls Diastolic BP Percentile --      Boys Systolic BP Percentile --      Boys Diastolic BP Percentile --      Pulse Rate 09/27/24 1623 85     Resp 09/27/24 1623 18     Temp 09/27/24 1623 98 F (36.7 C)     Temp Source 09/27/24 1623 Oral     SpO2 09/27/24 1623 100 %     Weight 09/27/24 1624 250 lb (113.4 kg)     Height 09/27/24 1624 6' 2 (1.88 m)     Head Circumference --      Peak Flow --      Pain Score 09/27/24 1632 10     Pain Loc --      Pain Education --      Exclude from Growth Chart --     Most recent vital signs: Vitals:   09/27/24 1623  BP: 136/83  Pulse: 85  Resp: 18  Temp: 98 F (36.7 C)  SpO2: 100%    General Awake, no distress.  HEENT NCAT.  CV:  Good peripheral perfusion.  RESP:  Normal effort.  ABD:  No distention.  Other:  Tenderness to palpation over bilateral anterior aspect.  Full active range of motion with knee flexion.  ED Results / Procedures / Treatments   Labs (all labs ordered are listed, but only abnormal results are displayed) Labs Reviewed - No data to display  No results found.  PROCEDURES:  Critical Care performed: No  Procedures   MEDICATIONS ORDERED IN ED: Medications  ketorolac  (TORADOL ) 30 MG/ML injection 30 mg (30 mg Intramuscular Given 09/27/24 1739)  acetaminophen  (TYLENOL ) tablet 650 mg (650 mg  Oral Given 09/27/24 1740)  dexamethasone  (DECADRON ) injection 10 mg (10 mg Intramuscular Given 09/27/24 1739)     IMPRESSION / MDM / ASSESSMENT AND PLAN / ED COURSE  I reviewed the triage vital signs and the nursing notes.                              Clinical Course as of 09/27/24 1812  Tue Sep 27, 2024  1754 Patient uncooperative for radiologist tech to perform bilateral knee x-rays. [MH]    Clinical Course User Index [MH] Margrette Rebbeca LABOR, PA-C   58 y.o. male presents to the emergency department for evaluation and treatment of bilateral knee pain without injury. See HPI for further details.   Differential diagnosis includes, but is not limited to arthritis, osteoarthritis, pain control  Patient's presentation is most consistent with acute, uncomplicated illness.  Chart reviewed patient had last office visit for bilateral osteoarthritis of knees 08/28.  Patient is alert and oriented.  He is hemodynamic stable.  Physical exam findings are overall benign.  No deformity or traumatic injury noted on my exam.  Full range of motion and neurovascular status intact all throughout.  X-rays of bilateral knees were offered to the patient however when the technician came into the room patient did not want to get them done.  This is reasonable as my suspicion for an acute bony abnormality is low.  I will provide pain control and have him follow-up with orthopedic for further evaluation.  Walker provided to patient at discharge for assistance with ambulation.  He is agreement with this plan.  ED return precaution discussed.  FINAL CLINICAL IMPRESSION(S) / ED DIAGNOSES   Final diagnoses:  Bilateral chronic knee pain   Rx / DC Orders   ED Discharge Orders          Ordered    meloxicam  (MOBIC ) 15 MG tablet  Daily        09/27/24 1811             Note:  This document was prepared using Dragon voice recognition software and may include unintentional dictation errors.    Margrette,  Areg Bialas A, PA-C 09/27/24 2231    Jossie Artist POUR, MD 09/27/24 [phone]

## 2024-09-29 DIAGNOSIS — M7989 Other specified soft tissue disorders: Secondary | ICD-10-CM | POA: Diagnosis present

## 2024-09-29 DIAGNOSIS — M1711 Unilateral primary osteoarthritis, right knee: Secondary | ICD-10-CM | POA: Diagnosis present

## 2024-09-29 DIAGNOSIS — L03115 Cellulitis of right lower limb: Principal | ICD-10-CM | POA: Diagnosis present

## 2024-09-29 DIAGNOSIS — Y92009 Unspecified place in unspecified non-institutional (private) residence as the place of occurrence of the external cause: Secondary | ICD-10-CM

## 2024-09-29 DIAGNOSIS — Z8249 Family history of ischemic heart disease and other diseases of the circulatory system: Secondary | ICD-10-CM

## 2024-09-29 DIAGNOSIS — E66811 Obesity, class 1: Secondary | ICD-10-CM | POA: Diagnosis present

## 2024-09-29 DIAGNOSIS — F1010 Alcohol abuse, uncomplicated: Secondary | ICD-10-CM | POA: Diagnosis present

## 2024-09-29 DIAGNOSIS — K219 Gastro-esophageal reflux disease without esophagitis: Secondary | ICD-10-CM | POA: Diagnosis present

## 2024-09-29 DIAGNOSIS — K5900 Constipation, unspecified: Secondary | ICD-10-CM | POA: Diagnosis not present

## 2024-09-29 DIAGNOSIS — Z7902 Long term (current) use of antithrombotics/antiplatelets: Secondary | ICD-10-CM

## 2024-09-29 DIAGNOSIS — Z6831 Body mass index (BMI) 31.0-31.9, adult: Secondary | ICD-10-CM

## 2024-09-29 DIAGNOSIS — S92401A Displaced unspecified fracture of right great toe, initial encounter for closed fracture: Secondary | ICD-10-CM | POA: Diagnosis present

## 2024-09-29 DIAGNOSIS — Z79899 Other long term (current) drug therapy: Secondary | ICD-10-CM

## 2024-09-29 DIAGNOSIS — W19XXXA Unspecified fall, initial encounter: Secondary | ICD-10-CM | POA: Diagnosis present

## 2024-09-29 DIAGNOSIS — Z791 Long term (current) use of non-steroidal anti-inflammatories (NSAID): Secondary | ICD-10-CM

## 2024-09-29 DIAGNOSIS — I69351 Hemiplegia and hemiparesis following cerebral infarction affecting right dominant side: Secondary | ICD-10-CM

## 2024-09-29 DIAGNOSIS — F17210 Nicotine dependence, cigarettes, uncomplicated: Secondary | ICD-10-CM | POA: Diagnosis present

## 2024-09-29 DIAGNOSIS — E782 Mixed hyperlipidemia: Secondary | ICD-10-CM | POA: Diagnosis present

## 2024-09-29 DIAGNOSIS — J4489 Other specified chronic obstructive pulmonary disease: Secondary | ICD-10-CM | POA: Diagnosis present

## 2024-09-29 DIAGNOSIS — I10 Essential (primary) hypertension: Secondary | ICD-10-CM | POA: Diagnosis present

## 2024-09-29 DIAGNOSIS — Z59869 Financial insecurity, unspecified: Secondary | ICD-10-CM

## 2024-09-29 NOTE — ED Triage Notes (Addendum)
 Pt arrives via EMS from home for c/o rt knee pain x 2 days; st I don't have no cartilage in my knees; denies any recent injury; seen recently for same

## 2024-09-30 ENCOUNTER — Emergency Department

## 2024-09-30 ENCOUNTER — Other Ambulatory Visit: Payer: Self-pay

## 2024-09-30 ENCOUNTER — Encounter: Payer: Self-pay | Admitting: Emergency Medicine

## 2024-09-30 ENCOUNTER — Inpatient Hospital Stay
Admission: EM | Admit: 2024-09-30 | Discharge: 2024-10-07 | DRG: 603 | Disposition: A | Discharge status: Skilled Nursing Facility | Attending: Internal Medicine | Admitting: Internal Medicine

## 2024-09-30 DIAGNOSIS — L0390 Cellulitis, unspecified: Secondary | ICD-10-CM | POA: Diagnosis present

## 2024-09-30 DIAGNOSIS — Z8673 Personal history of transient ischemic attack (TIA), and cerebral infarction without residual deficits: Secondary | ICD-10-CM

## 2024-09-30 DIAGNOSIS — L03115 Cellulitis of right lower limb: Secondary | ICD-10-CM | POA: Diagnosis not present

## 2024-09-30 DIAGNOSIS — I10 Essential (primary) hypertension: Secondary | ICD-10-CM | POA: Diagnosis present

## 2024-09-30 DIAGNOSIS — E782 Mixed hyperlipidemia: Secondary | ICD-10-CM | POA: Diagnosis present

## 2024-09-30 LAB — CBC WITH DIFFERENTIAL/PLATELET
Abs Immature Granulocytes: 0.09 K/uL — ABNORMAL HIGH (ref 0.00–0.07)
Basophils Absolute: 0 K/uL (ref 0.0–0.1)
Basophils Relative: 0 %
Eosinophils Absolute: 0.2 K/uL (ref 0.0–0.5)
Eosinophils Relative: 2 %
HCT: 34.1 % — ABNORMAL LOW (ref 39.0–52.0)
Hemoglobin: 11 g/dL — ABNORMAL LOW (ref 13.0–17.0)
Immature Granulocytes: 1 %
Lymphocytes Relative: 16 %
Lymphs Abs: 1.6 K/uL (ref 0.7–4.0)
MCH: 30.5 pg (ref 26.0–34.0)
MCHC: 32.3 g/dL (ref 30.0–36.0)
MCV: 94.5 fL (ref 80.0–100.0)
Monocytes Absolute: 1.1 K/uL — ABNORMAL HIGH (ref 0.1–1.0)
Monocytes Relative: 10 %
Neutro Abs: 7.5 K/uL (ref 1.7–7.7)
Neutrophils Relative %: 71 %
Platelets: 383 K/uL (ref 150–400)
RBC: 3.61 MIL/uL — ABNORMAL LOW (ref 4.22–5.81)
RDW: 13.8 % (ref 11.5–15.5)
WBC: 10.4 K/uL (ref 4.0–10.5)
nRBC: 0 % (ref 0.0–0.2)

## 2024-09-30 LAB — COMPREHENSIVE METABOLIC PANEL WITH GFR
ALT: 13 U/L (ref 0–44)
AST: 21 U/L (ref 15–41)
Albumin: 3.5 g/dL (ref 3.5–5.0)
Alkaline Phosphatase: 63 U/L (ref 38–126)
Anion gap: 12 (ref 5–15)
BUN: 34 mg/dL — ABNORMAL HIGH (ref 6–20)
CO2: 23 mmol/L (ref 22–32)
Calcium: 8.7 mg/dL — ABNORMAL LOW (ref 8.9–10.3)
Chloride: 100 mmol/L (ref 98–111)
Creatinine, Ser: 1.28 mg/dL — ABNORMAL HIGH (ref 0.61–1.24)
GFR, Estimated: >60 mL/min (ref >60)
Glucose, Bld: 151 mg/dL — ABNORMAL HIGH (ref 70–99)
Potassium: 4 mmol/L (ref 3.5–5.1)
Sodium: 135 mmol/L (ref 135–145)
Total Bilirubin: 0.3 mg/dL (ref 0.0–1.2)
Total Protein: 8.2 g/dL — ABNORMAL HIGH (ref 6.5–8.1)

## 2024-09-30 LAB — LACTIC ACID, PLASMA: Lactic Acid, Venous: 1.6 mmol/L (ref 0.5–1.9)

## 2024-09-30 MED ORDER — ORAL CARE MOUTH RINSE: 15.0000 mL | OROMUCOSAL | Status: DC | PRN

## 2024-09-30 MED ORDER — AMLODIPINE BESYLATE 10 MG PO TABS
10.0000 mg | ORAL_TABLET | Freq: Every day | ORAL | Status: DC
  Administered 2024-09-30 – 2024-10-07 (×8): 10 mg via ORAL
  Filled 2024-09-30 (×8): qty 1

## 2024-09-30 MED ORDER — VITAMIN B-12 1000 MCG PO TABS
1000.0000 ug | ORAL_TABLET | Freq: Every day | ORAL | Status: DC
  Administered 2024-09-30 – 2024-10-07 (×8): 1000 ug via ORAL
  Filled 2024-09-30 (×8): qty 1

## 2024-09-30 MED ORDER — FOLIC ACID 1 MG PO TABS
1.0000 mg | ORAL_TABLET | Freq: Every day | ORAL | Status: DC
  Administered 2024-09-30 – 2024-10-07 (×8): 1 mg via ORAL
  Filled 2024-09-30 (×8): qty 1

## 2024-09-30 MED ORDER — ENOXAPARIN SODIUM 60 MG/0.6ML IJ SOSY
0.5000 mg/kg | PREFILLED_SYRINGE | INTRAMUSCULAR | Status: DC
  Administered 2024-09-30 – 2024-10-07 (×8): 57.5 mg via SUBCUTANEOUS
  Filled 2024-09-30 (×8): qty 0.6

## 2024-09-30 MED ORDER — NICOTINE 21 MG/24HR TD PT24
21.0000 mg | MEDICATED_PATCH | Freq: Every day | TRANSDERMAL | Status: DC
  Administered 2024-09-30 – 2024-10-07 (×8): 21 mg via TRANSDERMAL
  Filled 2024-09-30 (×8): qty 1

## 2024-09-30 MED ORDER — VANCOMYCIN HCL IN DEXTROSE 1-5 GM/200ML-% IV SOLN
1000.0000 mg | Freq: Once | INTRAVENOUS | Status: DC
  Filled 2024-09-30: qty 200

## 2024-09-30 MED ORDER — CEFAZOLIN SODIUM-DEXTROSE 2-4 GM/100ML-% IV SOLN
2.0000 g | Freq: Three times a day (TID) | INTRAVENOUS | Status: AC
  Administered 2024-09-30 – 2024-10-07 (×21): 2 g via INTRAVENOUS
  Filled 2024-09-30 (×22): qty 100

## 2024-09-30 MED ORDER — VANCOMYCIN HCL 1500 MG/300ML IV SOLN
1500.0000 mg | Freq: Once | INTRAVENOUS | Status: DC
  Filled 2024-09-30: qty 300

## 2024-09-30 MED ORDER — LORAZEPAM 2 MG/ML IJ SOLN: 1.0000 mg | INTRAMUSCULAR | Status: AC | PRN

## 2024-09-30 MED ORDER — PANTOPRAZOLE SODIUM 40 MG PO TBEC
40.0000 mg | DELAYED_RELEASE_TABLET | Freq: Every day | ORAL | Status: DC
  Administered 2024-09-30 – 2024-10-06 (×7): 40 mg via ORAL
  Filled 2024-09-30 (×7): qty 1

## 2024-09-30 MED ORDER — CLOPIDOGREL BISULFATE 75 MG PO TABS
75.0000 mg | ORAL_TABLET | Freq: Every day | ORAL | Status: DC
  Administered 2024-09-30 – 2024-10-07 (×8): 75 mg via ORAL
  Filled 2024-09-30 (×8): qty 1

## 2024-09-30 MED ORDER — SODIUM CHLORIDE 0.9 % IV SOLN: 1.0000 g | INTRAVENOUS | Status: DC

## 2024-09-30 MED ORDER — MORPHINE SULFATE (PF) 2 MG/ML IV SOLN
2.0000 mg | INTRAVENOUS | Status: DC | PRN
  Administered 2024-09-30 – 2024-10-02 (×2): 2 mg via INTRAVENOUS
  Filled 2024-09-30 (×2): qty 1

## 2024-09-30 MED ORDER — ONDANSETRON HCL 4 MG/2ML IJ SOLN: 4.0000 mg | Freq: Four times a day (QID) | INTRAMUSCULAR | Status: DC | PRN

## 2024-09-30 MED ORDER — ACETAMINOPHEN 325 MG PO TABS
650.0000 mg | ORAL_TABLET | Freq: Four times a day (QID) | ORAL | Status: DC | PRN
  Administered 2024-10-04 – 2024-10-06 (×2): 650 mg via ORAL
  Filled 2024-09-30 (×3): qty 2

## 2024-09-30 MED ORDER — THIAMINE HCL 100 MG/ML IJ SOLN
100.0000 mg | Freq: Every day | INTRAMUSCULAR | Status: DC
  Filled 2024-09-30 (×2): qty 2

## 2024-09-30 MED ORDER — ADULT MULTIVITAMIN W/MINERALS CH
1.0000 | ORAL_TABLET | Freq: Every day | ORAL | Status: DC
  Administered 2024-09-30 – 2024-10-07 (×8): 1 via ORAL
  Filled 2024-09-30 (×8): qty 1

## 2024-09-30 MED ORDER — ONDANSETRON HCL 4 MG PO TABS: 4.0000 mg | ORAL_TABLET | Freq: Four times a day (QID) | ORAL | Status: DC | PRN

## 2024-09-30 MED ORDER — SODIUM CHLORIDE 0.9 % IV SOLN
2.0000 g | Freq: Once | INTRAVENOUS | Status: AC
  Administered 2024-09-30: 2 g via INTRAVENOUS
  Filled 2024-09-30: qty 20

## 2024-09-30 MED ORDER — VANCOMYCIN HCL 1500 MG/300ML IV SOLN
1500.0000 mg | Freq: Once | INTRAVENOUS | Status: DC
  Filled 2024-09-30 (×2): qty 300

## 2024-09-30 MED ORDER — THIAMINE MONONITRATE 100 MG PO TABS
100.0000 mg | ORAL_TABLET | Freq: Every day | ORAL | Status: DC
  Administered 2024-09-30 – 2024-10-07 (×8): 100 mg via ORAL
  Filled 2024-09-30 (×8): qty 1

## 2024-09-30 MED ORDER — ATORVASTATIN CALCIUM 80 MG PO TABS
80.0000 mg | ORAL_TABLET | Freq: Every day | ORAL | Status: DC
  Administered 2024-09-30 – 2024-10-07 (×8): 80 mg via ORAL
  Filled 2024-09-30 (×9): qty 1

## 2024-09-30 MED ORDER — MORPHINE SULFATE (PF) 4 MG/ML IV SOLN
4.0000 mg | Freq: Once | INTRAVENOUS | Status: AC
  Administered 2024-09-30: 4 mg via INTRAVENOUS
  Filled 2024-09-30: qty 1

## 2024-09-30 MED ORDER — ACETAMINOPHEN 650 MG RE SUPP: 650.0000 mg | Freq: Four times a day (QID) | RECTAL | Status: DC | PRN

## 2024-09-30 MED ORDER — ALBUTEROL SULFATE (2.5 MG/3ML) 0.083% IN NEBU: 2.5000 mg | INHALATION_SOLUTION | RESPIRATORY_TRACT | Status: DC | PRN

## 2024-09-30 MED ORDER — METOPROLOL SUCCINATE ER 50 MG PO TB24
50.0000 mg | ORAL_TABLET | Freq: Every day | ORAL | Status: DC
  Administered 2024-09-30 – 2024-10-07 (×8): 50 mg via ORAL
  Filled 2024-09-30 (×8): qty 1

## 2024-09-30 MED ORDER — SODIUM CHLORIDE 0.9 % IV SOLN: INTRAVENOUS | Status: DC

## 2024-09-30 MED ORDER — LORAZEPAM 1 MG PO TABS: 1.0000 mg | ORAL_TABLET | ORAL | Status: AC | PRN

## 2024-09-30 MED ORDER — ONDANSETRON HCL 4 MG/2ML IJ SOLN
4.0000 mg | Freq: Once | INTRAMUSCULAR | Status: AC
  Administered 2024-09-30: 4 mg via INTRAVENOUS
  Filled 2024-09-30: qty 2

## 2024-09-30 MED ORDER — HYDROCODONE-ACETAMINOPHEN 5-325 MG PO TABS
1.0000 | ORAL_TABLET | ORAL | Status: DC | PRN
  Administered 2024-09-30: 2 via ORAL
  Administered 2024-10-01: 1 via ORAL
  Administered 2024-10-01 – 2024-10-04 (×8): 2 via ORAL
  Filled 2024-09-30 (×10): qty 2
  Filled 2024-09-30: qty 1

## 2024-09-30 NOTE — Progress Notes (Signed)
 Anticoagulation monitoring(Lovenox ):  58 yo male ordered Lovenox  40 mg Q24h    Filed Weights   09/29/24 2352 09/30/24 0001  Weight: 113.4 kg (250 lb) 113 kg (249 lb 1.9 oz)   BMI 32   Lab Results  Component Value Date   CREATININE 1.28 (H) 09/30/2024   CREATININE 1.25 (H) 08/25/2024   CREATININE 1.15 07/12/2024   Estimated Creatinine Clearance: 85.1 mL/min (A) (by C-G formula based on SCr of 1.28 mg/dL (H)). Hemoglobin & Hematocrit     Component Value Date/Time   HGB 11.0 (L) 09/30/2024 0141   HGB 11.6 (L) 07/12/2024 1535   HCT 34.1 (L) 09/30/2024 0141   HCT 35.6 (L) 07/12/2024 1535     Per Protocol for Patient with estCrcl > 30 ml/min and BMI > 30, will transition to Lovenox  57.5 mg Q24h.

## 2024-09-30 NOTE — TOC Initial Note (Signed)
 Transition of Care [facility]) - Initial/Assessment Note    Patient Details  Name: James Lambert MRN: [id number] Date of Birth: [birth year]  Transition of Care [facility]) CM/SW Contact:    [place] L Lawerence Dery, LCSW Phone Number: 09/30/2024, 9:45 AM  Clinical Narrative:                  [facility] consult received for substance abuse education/counseling. TOC does not provide substance abuse education/counseling. Resources have been added to the AVS.        Patient Goals and CMS Choice            Expected Discharge Plan and Services                                              Prior Living Arrangements/Services                       Activities of Daily Living   ADL Screening (condition at time of admission) Independently performs ADLs?: Yes (appropriate for developmental age) Is the patient deaf or have difficulty hearing?: No Does the patient have difficulty seeing, even when wearing glasses/contacts?: No Does the patient have difficulty concentrating, remembering, or making decisions?: No  Permission Sought/Granted                  Emotional Assessment              Admission diagnosis:  Cellulitis [L03.90] Cellulitis of right lower extremity [L03.115] Patient Active Problem List   Diagnosis Date Noted   Cellulitis 09/30/2024   Coronary atherosclerosis 08/21/2024   TIA (transient ischemic attack) 06/27/2024   Seizure (HCC) 06/27/2024   Complex tear of meniscus of right knee 04/25/2023   Obesity (BMI 30-39.9) 04/24/2023   Elevated hemoglobin A1c measurement 09/11/2022   Hemiparesis affecting right side as late effect of stroke (HCC) 09/10/2022   Bilateral chronic knee pain 09/10/2022   Aortic atherosclerosis 09/10/2022   GERD without esophagitis 08/30/2022   Mixed hyperlipidemia 08/30/2022   Alcohol abuse 08/30/2022   Hypertension    Nicotine  dependence, cigarettes, uncomplicated    Hidradenitis suppurativa    Centrilobular emphysema (HCC)     History of CVA (cerebrovascular accident) 03/08/2022   PCP:  [name], NP Pharmacy:   [facility] [address]), [employer] - [address] [place]) [place] [address] Phone: [phone] Fax: [phone]  [facility] - [place], [place] - [address] [place] [address] Phone: [phone] Fax: [phone]     Social Drivers of Health (SDOH) Social History: SDOH Screenings   Food Insecurity: No Food Insecurity (09/30/2024)  Housing: Low Risk  (09/30/2024)  Transportation Needs: No Transportation Needs (09/30/2024)  Utilities: Not At Risk (09/30/2024)  Alcohol Screen: Low Risk  (09/10/2022)  Depression (PHQ2-9): Low Risk  (07/12/2024)  Financial Resource Strain: High Risk (03/09/2023)  Physical Activity: Insufficiently Active (09/10/2022)  Social Connections: Moderately Isolated (09/30/2024)  Stress: Stress Concern Present (08/18/2023)  Tobacco Use: High Risk (09/30/2024)   SDOH Interventions:     Readmission Risk Interventions     No data to display

## 2024-09-30 NOTE — Progress Notes (Signed)
 ED Pharmacy Antibiotic Sign Off An antibiotic consult was received from an ED provider for Vancomycin  per pharmacy dosing for cellulitis. A chart review was completed to assess appropriateness.   The following one time order(s) were placed:  Vancomycin  2500 mg IV X 1   Further antibiotic and/or antibiotic pharmacy consults should be ordered by the admitting provider if indicated.   Thank you for allowing pharmacy to be a part of this patient's care.   Silver Selinda BIRCH, [facility]  Clinical Pharmacist 09/30/24 1:28 AM

## 2024-09-30 NOTE — Consult Note (Signed)
 WOC Nurse Consult Note:  WOC consult performed remotely utilizing imaging and chart review Reason for Consult: buttocks wounds Wound type: scattered open wounds to bilateral buttocks, chronic appearance with evidence of prior healing in the area.  History reviewed- no mention of buttocks wounds, patient may benefit from a dermatology follow-up post hospitalization Pressure Injury POA: NA Measurement: see nursing flow sheets Wound bed: scattered small wounds with no wound bed visible on image  Drainage (amount, consistency, odor) see nursing flow sheets- yellow/brown drainage prevent on adult brief in image unclear if from wound or other source Periwound: intact with scattered scarring areas Dressing procedure/placement/frequency:   Cleanse buttocks with soap and warm water, pat dry.  Can place a peri-pad in underwear to catch any drainage.   WOC team will not follow patient at this time, please re consult if new needs arise.   Thank you,  Doyal Polite, MSN, RN, [facility] WOC Team [phone] (Available Mon-Fri 0700-1500)

## 2024-09-30 NOTE — H&P (Signed)
 [facility] History and Physical  James Lambert FMW:[id number] DOB: December 17, 1965 DOA: 09/30/2024  Referring physician: ED  PCP: Cannady, Jolene T, NP   Patient is coming from: Home  Chief Complaint: Right leg swelling and pain  HPI:    Patient is a 58 years old male with past medical history of alcohol use disorder, hypertension, hyperlipidemia and history of CVA and COPD presented to hospital with right lower extremity redness warmth and swelling with pain.  He has been having difficulty walking because of that.  Patient does not recall how it exactly started but denied any fever, chills or rigors at home.  Denies any shortness of breath cough chest pain or palpitation.  Denies any nausea vomiting or abdominal pain.  Denies any changes in his urinary habits.  Denies any syncope or lightheadedness.    In the ED, patient was afebrile.  Blood pressure slightly elevated.  Labs were remarkable for WBC at 10.4.  Creatinine at 1.2.  Lactate of 1.6.  Blood cultures were sent from the ED negative in less than 12 hours.  X-ray of the right knee showed DJD.  Right lower extremity venous duplex ultrasound showed no evidence of DVT.  Patient was then admitted to hospital due to ongoing pain with extent of cellulitis for IV antibiotics   Assessment and Plan Principal Problem:   Cellulitis Active Problems:   History of CVA (cerebrovascular accident)   Hypertension   Mixed hyperlipidemia  Right lower extremity cellulitis.  Patient received vancomycin  and Rocephin  in the ED, will continue cefazolin  for now.  Used cellulitis order set..  Patient does have significant swelling and tenderness on palpation.  Will encourage leg elevation.  Closely monitor.  Will discontinue IV fluids at this time.  Hypertension.  Patient is on amlodipine , Benicar  HCT and metoprolol  at home.  Continue amlodipine  and metoprolol  for now.  Hyperlipidemia.  Continue Lipitor .  LFTs within normal limit  History of  alcohol abuse.  Currently on CIWA protocol.  Will continue thiamine  folic acid  supplementation..  Continue Protonix .  TOC has been consulted.  Ambulatory dysfunction secondary to cellulitis.  Will continue ambulation while in the hospital as possible.  Pain control.  Continue with vitamin B12.  Will try to ambulate the patient.  If unable might need PT evaluation  History of COPD.  Continue albuterol  inhaler.  Patient is on an oral Ellipta at home as well.  History of CVA.  On Lipitor  Plavix .  Class I obesity.Body mass index is 31.99 kg/m.  Would benefit from lifestyle modification as outpatient.  DVT Prophylaxis: Lovenox  subcu  Review of Systems:  All systems were reviewed and were negative unless otherwise mentioned in the HPI   Past Medical History:  Diagnosis Date   Alcohol abuse    Anemia    Aortic atherosclerosis    Asthma    COPD (chronic obstructive pulmonary disease) (HCC)    CVA (cerebral vascular accident) (HCC) 05/10/2022   right side weakness   GERD (gastroesophageal reflux disease)    Hemiparesis affecting right side as late effect of stroke (HCC)    Hidradenitis suppurativa    diagnosed in [facility] ED based on history and physical exam   HLD (hyperlipidemia)    Hypertension    Marijuana use, continuous    Seizure cerebral (HCC)    Sleep apnea    does not use cpap   Tobacco use    Past Surgical History:  Procedure Laterality Date   INCISION AND DRAINAGE ABSCESS Bilateral 11/13/2022  Procedure: INCISION AND DRAINAGE ABSCESS bilateral buttocks & left groin;  Surgeon: Desiderio Schanz, MD;  Location: [facility] ORS;  Service: General;  Laterality: Bilateral;   IR CT HEAD LTD  05/10/2022   IR FLUORO GUIDED NEEDLE PLC ASPIRATION/INJECTION LOC  03/20/2022   IR PERCUTANEOUS ART THROMBECTOMY/INFUSION INTRACRANIAL INC DIAG ANGIO  05/10/2022   IR US  GUIDE VASC ACCESS RIGHT  05/10/2022   RADIOLOGY WITH ANESTHESIA N/A 05/10/2022   Procedure: IR WITH ANESTHESIA;  Surgeon: Dolphus Carrion, MD;  Location: [facility] OR;  Service: Radiology;  Laterality: N/A;   TEE WITHOUT CARDIOVERSION N/A 03/11/2022   Procedure: TRANSESOPHAGEAL ECHOCARDIOGRAM (TEE);  Surgeon: Ditullio Wolm PARAS, MD;  Location: [facility] ORS;  Service: Cardiovascular;  Laterality: N/A;    Social History:  reports that he has been smoking cigarettes. He has a 33.8 pack-year smoking history. He has been exposed to tobacco smoke. He has never used smokeless tobacco. He reports current alcohol use. He reports current drug use. Frequency: 14.00 times per week. Drug: Marijuana.  No Known Allergies  Family History  Problem Relation Age of Onset   Diabetes Mother    Alzheimer's disease Mother    Hypertension Father    Bone cancer Father    Diabetes Brother    Hypertension Brother    Other Maternal Grandmother        unknown medical history   Other Maternal Grandfather        unknown medical history   Other Paternal Grandmother        unknown medical history   Other Paternal Grandfather        unknown medical history     Prior to Admission medications   Medication Sig Start Date End Date Taking? Authorizing Provider  acetaminophen  (TYLENOL ) 500 MG tablet Take 2 tablets (1,000 mg total) by mouth every 6 (six) hours as needed. 05/29/24 05/29/25  Clarine Ozell LABOR, MD  albuterol  (VENTOLIN  HFA) 108 (90 Base) MCG/ACT inhaler Inhale 1-2 puffs into the lungs every 6 (six) hours as needed for wheezing or shortness of breath. 03/17/24   Cannady, Jolene T, NP  amLODipine  (NORVASC ) 10 MG tablet Take 1 tablet (10 mg total) by mouth daily. 07/12/24   Cannady, Jolene T, NP  atorvastatin  (LIPITOR ) 80 MG tablet Take 1 tablet (80 mg total) by mouth daily. 07/12/24   Cannady, Jolene T, NP  clopidogrel  (PLAVIX ) 75 MG tablet Take 1 tablet (75 mg total) by mouth daily. 07/12/24   Cannady, Jolene T, NP  cyanocobalamin  1000 MCG tablet Take 1 tablet (1,000 mcg total) by mouth daily. 06/30/24   Alexander, Natalie, DO  meloxicam  (MOBIC ) 15 MG tablet Take  1 tablet (15 mg total) by mouth daily. 09/27/24 10/27/24  Margrette, Myah A, PA-C  metoprolol  succinate (TOPROL -XL) 50 MG 24 hr tablet Take 1 tablet (50 mg total) by mouth daily with or immediately following a meal. 07/12/24   Cannady, Jolene T, NP  Multiple Vitamin (MULTIVITAMIN WITH MINERALS) TABS tablet Take 1 tablet by mouth daily. 05/27/22   Setzer, Sandra J, PA-C  mupirocin  ointment (BACTROBAN ) 2 % Apply 1 Application topically 2 (two) times daily. 03/17/24   Cannady, Jolene T, NP  olmesartan -hydrochlorothiazide  (BENICAR  HCT) 40-12.5 MG tablet Take 1 tablet by mouth daily. 03/17/24   Cannady, Jolene T, NP  pantoprazole  (PROTONIX ) 40 MG tablet Take 1 tablet (40 mg total) by mouth at bedtime. 07/12/24   Cannady, Jolene T, NP  thiamine  (VITAMIN B-1) 100 MG tablet Take 1 tablet (100 mg total) by mouth daily.  07/12/24   Cannady, Jolene T, NP  tiZANidine  (ZANAFLEX ) 2 MG tablet Take 1 tablet (2 mg total) by mouth at bedtime. May also take 1 tablet (2 mg total) daily as needed for muscle spasms. 06/29/24   Alexander, Natalie, DO  umeclidinium-vilanterol (ANORO ELLIPTA ) 62.5-25 MCG/ACT AEPB Inhale 1 puff into the lungs daily at 6 (six) AM. 03/17/24   Valerio Moris T, NP    Physical Exam:  Vitals:   09/29/24 2352 09/30/24 0001 09/30/24 0527 09/30/24 0540  BP: (!) 154/82  (!) 91/59 119/64  Pulse: 91  78 75  Resp: 20  18   Temp: 98.2 F (36.8 C)  97.8 F (36.6 C)   TempSrc: Oral     SpO2: 100%  99% 99%  Weight: 113.4 kg 113 kg    Height: 6' 2 (1.88 m) 6' 2 (1.88 m)     Wt Readings from Last 3 Encounters:  09/30/24 113 kg  09/27/24 113.4 kg  09/05/24 103.9 kg   Body mass index is 31.99 kg/m.  General: Obese built, not in obvious distress HENT: Normocephalic, No scleral pallor or icterus noted. Oral mucosa is moist.  Chest:  Clear breath sounds.  . No crackles or wheezes.  CVS: S1 &S2 heard. No murmur.  Regular rate and rhythm. Abdomen: Soft, nontender, nondistended.  Bowel sounds are heard. No  abdominal mass palpated Extremities: No cyanosis, clubbing but right lower extremity with area of scab in the leg and surrounding area of edema tenderness with rise in local temp Psych: Alert, awake and oriented, normal mood CNS:  No cranial nerve deficits.  Moves extremities. Skin: Warm and dry.  Right leg with swelling and tenderness.  Labs on Admission:   CBC: Recent Labs  Lab 09/30/24 0141  WBC 10.4  NEUTROABS 7.5  HGB 11.0*  HCT 34.1*  MCV 94.5  PLT 383    Basic Metabolic Panel: Recent Labs  Lab 09/30/24 0141  NA 135  K 4.0  CL 100  CO2 23  GLUCOSE 151*  BUN 34*  CREATININE 1.28*  CALCIUM  8.7*    Liver Function Tests: Recent Labs  Lab 09/30/24 0141  AST 21  ALT 13  ALKPHOS 63  BILITOT 0.3  PROT 8.2*  ALBUMIN 3.5   No results for input(s): LIPASE, AMYLASE in the last 168 hours. No results for input(s): AMMONIA in the last 168 hours.  Cardiac Enzymes: No results for input(s): CKTOTAL, CKMB, CKMBINDEX, TROPONINI in the last 168 hours.  BNP (last 3 results) No results for input(s): BNP in the last 8760 hours.  ProBNP (last 3 results) No results for input(s): PROBNP in the last 8760 hours.  CBG: No results for input(s): GLUCAP in the last 168 hours.  Lipase     Component Value Date/Time   LIPASE 29 06/09/2022 1813     Urinalysis    Component Value Date/Time   COLORURINE YELLOW (A) 08/25/2024 1601   APPEARANCEUR CLEAR (A) 08/25/2024 1601   LABSPEC 1.017 08/25/2024 1601   PHURINE 6.0 08/25/2024 1601   GLUCOSEU NEGATIVE 08/25/2024 1601   HGBUR NEGATIVE 08/25/2024 1601   BILIRUBINUR NEGATIVE 08/25/2024 1601   KETONESUR NEGATIVE 08/25/2024 1601   PROTEINUR NEGATIVE 08/25/2024 1601   NITRITE NEGATIVE 08/25/2024 1601   LEUKOCYTESUR NEGATIVE 08/25/2024 1601     Drugs of Abuse     Component Value Date/Time   LABOPIA NONE DETECTED 06/27/2024 1728   LABOPIA NONE DETECTED 05/11/2022 0405   COCAINSCRNUR NONE DETECTED  06/27/2024 1728   LABBENZ NONE  DETECTED 06/27/2024 1728   LABBENZ NONE DETECTED 05/11/2022 0405   AMPHETMU NONE DETECTED 06/27/2024 1728   AMPHETMU NONE DETECTED 05/11/2022 0405   THCU POSITIVE (A) 06/27/2024 1728   THCU POSITIVE (A) 05/11/2022 0405   LABBARB NONE DETECTED 06/27/2024 1728   LABBARB NONE DETECTED 05/11/2022 0405      Radiological Exams on Admission: US  Venous Img Lower Unilateral Right Result Date: 09/30/2024 CLINICAL DATA:  Right leg pain and swelling EXAM: RIGHT LOWER EXTREMITY VENOUS DOPPLER ULTRASOUND TECHNIQUE: Gray-scale sonography with graded compression, as well as color Doppler and duplex ultrasound were performed to evaluate the lower extremity deep venous systems from the level of the common femoral vein and including the common femoral, femoral, profunda femoral, popliteal and calf veins including the posterior tibial, peroneal and gastrocnemius veins when visible. The superficial great saphenous vein was also interrogated. Spectral Doppler was utilized to evaluate flow at rest and with distal augmentation maneuvers in the common femoral, femoral and popliteal veins. COMPARISON:  None Available. FINDINGS: Contralateral Common Femoral Vein: Respiratory phasicity is normal and symmetric with the symptomatic side. No evidence of thrombus. Normal compressibility. Common Femoral Vein: No evidence of thrombus. Normal compressibility, respiratory phasicity and response to augmentation. Saphenofemoral Junction: No evidence of thrombus. Normal compressibility and flow on color Doppler imaging. Profunda Femoral Vein: No evidence of thrombus. Normal compressibility and flow on color Doppler imaging. Femoral Vein: No evidence of thrombus. Normal compressibility, respiratory phasicity and response to augmentation. Popliteal Vein: No evidence of thrombus. Normal compressibility, respiratory phasicity and response to augmentation. Calf Veins: No evidence of thrombus. Normal compressibility  and flow on color Doppler imaging. Superficial Great Saphenous Vein: No evidence of thrombus. Normal compressibility. Venous Reflux:  None. Other Findings: Generalized edema in the right lower extremity is noted. IMPRESSION: No evidence of deep venous thrombosis. Electronically Signed   By: Oneil Devonshire M.D.   On: 09/30/2024 02:34   DG Knee 1-2 Views Right Result Date: 09/30/2024 CLINICAL DATA:  Right knee pain for 2 days, no known injury, initial encounter EXAM: RIGHT KNEE - 2 VIEW COMPARISON:  04/24/2023 FINDINGS: Tricompartmental degenerative changes of the right knee are noted. Small joint effusion is seen. No acute fracture or dislocation is noted. IMPRESSION: Tricompartmental degenerative changes similar to that seen on the prior exam. Electronically Signed   By: Oneil Devonshire M.D.   On: 09/30/2024 01:04    EKG: Not available for review   Consultant: None  Code Status: Full code  Microbiology blood cultures  Antibiotics: Rocephin  IV  Family Communication:  Patients' condition and plan of care including tests being ordered have been discussed with the patient  who indicate understanding and agree with the plan.   Status is: Observation The patient remains OBS appropriate and will d/c before 2 midnights.   Severity of Illness: The appropriate patient status for this patient is OBSERVATION. Observation status is judged to be reasonable and necessary in order to provide the required intensity of service to ensure the patient's safety. The patient's presenting symptoms, physical exam findings, and initial radiographic and laboratory data in the context of their medical condition is felt to place them at decreased risk for further clinical deterioration. Furthermore, it is anticipated that the patient will be medically stable for discharge from the hospital within 2 midnights of admission.   Signed, Vernal Alstrom, MD [facility] 09/30/2024

## 2024-09-30 NOTE — Discharge Instructions (Signed)
 Residential Substance Use Treatment Services   [facility] ([facility]  [address]  [place], [place] [address]  [phone] or [phone] Detox (Medicare, Medicaid, private insurance, and self pay)  Residential Rehab 14 days (Medicare, [place], private insurance, and self pay)   [facility])  [address] [place], [place]  [phone]  Male and Male Detox (Self Pay and Medicaid limited availability)  Rehab only Male ([place] and self pay only)   [facility] [address]      [address]  [place], [place] [address]  [phone] or [phone] Detox and Residential Treatment Private Insurance Only   [facility]  [address] [place]  [place], [place] [address]  [phone]  Treatment Only, must make assessment appointment, and must be sober for assessment appointment.  Self Pay Only, Medicare A&B, [facility], [place] ID only! *Transportation assistance offered from [place] [employer]     [address] [place], [place] [address] Walk in interviews M-Sat 8-4p No pending legal charges [phone]     ADATC:  [facility] Referral  [address] [place], [place] [phone] (Self Pay, [facility])  [facility] [address] [place], [place] [address] [phone] Detox and Residential Treatment Medicare and Private Insurance  [facility] [address]  [place], [place] [address] 28 Day Women's Facility: [phone] 28 Day Men's Facility: [phone] Long-term Residential Program:  [phone] Males 25 and Over (No Insurance, upfront fee)  [facility]  [address] [place], [place] [address] [phone] Private Insurance with [employer], Private Pay  [facility] [address] [place], [place] [address] Local [phone] Private Insurance Only  [facility] [address]  [place], [place] [address]  [phone] (Males, upfront fee)  [facility] [address]  [place], [address] [phone] Private Insurance

## 2024-09-30 NOTE — Hospital Course (Addendum)
 Patient is 58 years old male with past medical history of alcohol use disorder, hypertension, hyperlipidemia and history of CVA with residual right-sided weakness and COPD presented to hospital with right lower extremity redness warmth and swelling with pain.  He has been having difficulty walking because of that.  In the ED, patient was afebrile.  Blood pressure slightly elevated.  Labs were remarkable for WBC at 10.4.  Creatinine at 1.2.  Lactate of 1.6.  Blood cultures were sent from the ED negative in less than 12 hours.  X-ray of the right knee showed DJD.  Right lower extremity venous duplex ultrasound showed no evidence of DVT.  Patient was then admitted to hospital due to ongoing pain with extent of cellulitis for IV antibiotics  Right lower extremity cellulitis.  Patient received vancomycin  in the ED.  Continue vancomycin  and Rocephin  for now.  Will want continue with IV antibiotic.  On IV fluids.  Hypertension.  Patient is on amlodipine , Benicar  HCT and metoprolol  at home.  Continue amlodipine  and metoprolol  for now.  Hyperlipidemia.  Continue Lipitor .  LFTs within normal limit  History of alcohol abuse.  Currently on CIWA protocol.  Will continue thiamine  folic acid  supplementation..  Continue Protonix .  TOC has been consulted.  Ambulatory dysfunction secondary to cellulitis.  Will continue ambulation while in the hospital as possible.  Pain control.  Continue with vitamin B12  History of COPD.  Continue albuterol  inhaler.  Patient is on an oral Ellipta at home as well.  History of CVA.  On Lipitor  Plavix .

## 2024-09-30 NOTE — ED Provider Notes (Signed)
 [facility] Provider Note    Event Date/Time   First MD Initiated Contact with Patient 09/30/24 [phone]     (approximate)   History   Knee Pain   HPI  James Lambert is a 58 y.o. male with history of alcohol use disorder, hypertension, hyperlipidemia, CVA with right-sided weakness, COPD who presents to the emergency department with increased right lower extremity pain.  Leg has been red, warm and swollen.  No history of DVT.  He denies any fevers, chills.  No history of diabetes.  No vomiting or diarrhea.  No chest pain or shortness of breath.  Pain is affecting his ability to walk.   History provided by patient.    Past Medical History:  Diagnosis Date   Alcohol abuse    Anemia    Aortic atherosclerosis    Asthma    COPD (chronic obstructive pulmonary disease) (HCC)    CVA (cerebral vascular accident) (HCC) 05/10/2022   right side weakness   GERD (gastroesophageal reflux disease)    Hemiparesis affecting right side as late effect of stroke (HCC)    Hidradenitis suppurativa    diagnosed in [facility] ED based on history and physical exam   HLD (hyperlipidemia)    Hypertension    Marijuana use, continuous    Seizure cerebral (HCC)    Sleep apnea    does not use cpap   Tobacco use     Past Surgical History:  Procedure Laterality Date   INCISION AND DRAINAGE ABSCESS Bilateral 11/13/2022   Procedure: INCISION AND DRAINAGE ABSCESS bilateral buttocks & left groin;  Surgeon: Desiderio Schanz, MD;  Location: [facility] ORS;  Service: General;  Laterality: Bilateral;   IR CT HEAD LTD  05/10/2022   IR FLUORO GUIDED NEEDLE PLC ASPIRATION/INJECTION LOC  03/20/2022   IR PERCUTANEOUS ART THROMBECTOMY/INFUSION INTRACRANIAL INC DIAG ANGIO  05/10/2022   IR US  GUIDE VASC ACCESS RIGHT  05/10/2022   RADIOLOGY WITH ANESTHESIA N/A 05/10/2022   Procedure: IR WITH ANESTHESIA;  Surgeon: Dolphus Carrion, MD;  Location: [facility] OR;  Service: Radiology;  Laterality: N/A;   TEE WITHOUT  CARDIOVERSION N/A 03/11/2022   Procedure: TRANSESOPHAGEAL ECHOCARDIOGRAM (TEE);  Surgeon: Dibble Wolm PARAS, MD;  Location: [facility] ORS;  Service: Cardiovascular;  Laterality: N/A;    MEDICATIONS:  Prior to Admission medications   Medication Sig Start Date End Date Taking? Authorizing Provider  acetaminophen  (TYLENOL ) 500 MG tablet Take 2 tablets (1,000 mg total) by mouth every 6 (six) hours as needed. 05/29/24 05/29/25  Clarine Ozell LABOR, MD  albuterol  (VENTOLIN  HFA) 108 (90 Base) MCG/ACT inhaler Inhale 1-2 puffs into the lungs every 6 (six) hours as needed for wheezing or shortness of breath. 03/17/24   Cannady, Jolene T, NP  amLODipine  (NORVASC ) 10 MG tablet Take 1 tablet (10 mg total) by mouth daily. 07/12/24   Cannady, Jolene T, NP  atorvastatin  (LIPITOR ) 80 MG tablet Take 1 tablet (80 mg total) by mouth daily. 07/12/24   Cannady, Jolene T, NP  clopidogrel  (PLAVIX ) 75 MG tablet Take 1 tablet (75 mg total) by mouth daily. 07/12/24   Cannady, Jolene T, NP  cyanocobalamin  1000 MCG tablet Take 1 tablet (1,000 mcg total) by mouth daily. 06/30/24   Alexander, Natalie, DO  meloxicam  (MOBIC ) 15 MG tablet Take 1 tablet (15 mg total) by mouth daily. 09/27/24 10/27/24  Margrette, Myah A, PA-C  metoprolol  succinate (TOPROL -XL) 50 MG 24 hr tablet Take 1 tablet (50 mg total) by mouth daily with or immediately following  a meal. 07/12/24   Cannady, Jolene T, NP  Multiple Vitamin (MULTIVITAMIN WITH MINERALS) TABS tablet Take 1 tablet by mouth daily. 05/27/22   Setzer, Sandra J, PA-C  mupirocin  ointment (BACTROBAN ) 2 % Apply 1 Application topically 2 (two) times daily. 03/17/24   Cannady, Jolene T, NP  olmesartan -hydrochlorothiazide  (BENICAR  HCT) 40-12.5 MG tablet Take 1 tablet by mouth daily. 03/17/24   Cannady, Jolene T, NP  pantoprazole  (PROTONIX ) 40 MG tablet Take 1 tablet (40 mg total) by mouth at bedtime. 07/12/24   Cannady, Jolene T, NP  thiamine  (VITAMIN B-1) 100 MG tablet Take 1 tablet (100 mg total) by mouth daily. 07/12/24    Cannady, Jolene T, NP  tiZANidine  (ZANAFLEX ) 2 MG tablet Take 1 tablet (2 mg total) by mouth at bedtime. May also take 1 tablet (2 mg total) daily as needed for muscle spasms. 06/29/24   Alexander, Natalie, DO  umeclidinium-vilanterol (ANORO ELLIPTA ) 62.5-25 MCG/ACT AEPB Inhale 1 puff into the lungs daily at 6 (six) AM. 03/17/24   [name], NP    Physical Exam   Triage Vital Signs: ED Triage Vitals  Encounter Vitals Group     BP 09/29/24 2352 (!) 154/82     Girls Systolic BP Percentile --      Girls Diastolic BP Percentile --      Boys Systolic BP Percentile --      Boys Diastolic BP Percentile --      Pulse Rate 09/29/24 2352 91     Resp 09/29/24 2352 20     Temp 09/29/24 2352 98.2 F (36.8 C)     Temp Source 09/29/24 2352 Oral     SpO2 09/29/24 2352 100 %     Weight 09/29/24 2352 250 lb (113.4 kg)     Height 09/29/24 2352 6' 2 (1.88 m)     Head Circumference --      Peak Flow --      Pain Score 09/29/24 2358 10     Pain Loc --      Pain Education --      Exclude from Growth Chart --     Most recent vital signs: Vitals:   09/29/24 2352  BP: (!) 154/82  Pulse: 91  Resp: 20  Temp: 98.2 F (36.8 C)  SpO2: 100%    CONSTITUTIONAL: Alert, responds appropriately to questions.  Chronically ill-appearing, appears uncomfortable HEAD: Normocephalic, atraumatic EYES: Conjunctivae clear, pupils appear equal, sclera nonicteric ENT: normal nose; moist mucous membranes NECK: Supple, normal ROM CARD: RRR; S1 and S2 appreciated RESP: Normal chest excursion without splinting or tachypnea; breath sounds clear and equal bilaterally; no wheezes, no rhonchi, no rales, no hypoxia or respiratory distress, speaking full sentences ABD/GI: Non-distended; soft, non-tender, no rebound, no guarding, no peritoneal signs BACK: The back appears normal EXT: Patient does not have any significant tenderness over the right knee.  Pain is mostly over the right shin, calf and dorsal foot where  there is increased redness, warmth.  He also has a superficial scabbed abrasion to the distal mid shin with increased redness but no induration or fluctuance.  No bleeding or drainage.  2+ right DP pulse.  He is able to range the knee without significant difficulty and I do not appreciate a joint effusion. SKIN: Normal color for age and race; warm; no rash on exposed skin NEURO: Moves all extremities equally, normal speech PSYCH: The patient's mood and manner are appropriate.     Patient gave verbal permission to utilize photo  for medical documentation only. The image was not stored on any personal device.   ED Results / Procedures / Treatments   LABS: (all labs ordered are listed, but only abnormal results are displayed) Labs Reviewed  CBC WITH DIFFERENTIAL/PLATELET - Abnormal; Notable for the following components:      Result Value   RBC 3.61 (*)    Hemoglobin 11.0 (*)    HCT 34.1 (*)    Monocytes Absolute 1.1 (*)    Abs Immature Granulocytes 0.09 (*)    All other components within normal limits  COMPREHENSIVE METABOLIC PANEL WITH GFR - Abnormal; Notable for the following components:   Glucose, Bld 151 (*)    BUN 34 (*)    Creatinine, Ser 1.28 (*)    Calcium  8.7 (*)    Total Protein 8.2 (*)    All other components within normal limits  CULTURE, BLOOD (ROUTINE X 2)  CULTURE, BLOOD (ROUTINE X 2)  LACTIC ACID, PLASMA     EKG:   RADIOLOGY: My personal review and interpretation of imaging: X-ray shows no acute abnormality.  No joint effusion.  Doppler shows no DVT.  I have personally reviewed all radiology reports.   US  Venous Img Lower Unilateral Right Result Date: 09/30/2024 CLINICAL DATA:  Right leg pain and swelling EXAM: RIGHT LOWER EXTREMITY VENOUS DOPPLER ULTRASOUND TECHNIQUE: Gray-scale sonography with graded compression, as well as color Doppler and duplex ultrasound were performed to evaluate the lower extremity deep venous systems from the level of the common  femoral vein and including the common femoral, femoral, profunda femoral, popliteal and calf veins including the posterior tibial, peroneal and gastrocnemius veins when visible. The superficial great saphenous vein was also interrogated. Spectral Doppler was utilized to evaluate flow at rest and with distal augmentation maneuvers in the common femoral, femoral and popliteal veins. COMPARISON:  None Available. FINDINGS: Contralateral Common Femoral Vein: Respiratory phasicity is normal and symmetric with the symptomatic side. No evidence of thrombus. Normal compressibility. Common Femoral Vein: No evidence of thrombus. Normal compressibility, respiratory phasicity and response to augmentation. Saphenofemoral Junction: No evidence of thrombus. Normal compressibility and flow on color Doppler imaging. Profunda Femoral Vein: No evidence of thrombus. Normal compressibility and flow on color Doppler imaging. Femoral Vein: No evidence of thrombus. Normal compressibility, respiratory phasicity and response to augmentation. Popliteal Vein: No evidence of thrombus. Normal compressibility, respiratory phasicity and response to augmentation. Calf Veins: No evidence of thrombus. Normal compressibility and flow on color Doppler imaging. Superficial Great Saphenous Vein: No evidence of thrombus. Normal compressibility. Venous Reflux:  None. Other Findings: Generalized edema in the right lower extremity is noted. IMPRESSION: No evidence of deep venous thrombosis. Electronically Signed   By: Oneil Devonshire M.D.   On: 09/30/2024 02:34   DG Knee 1-2 Views Right Result Date: 09/30/2024 CLINICAL DATA:  Right knee pain for 2 days, no known injury, initial encounter EXAM: RIGHT KNEE - 2 VIEW COMPARISON:  04/24/2023 FINDINGS: Tricompartmental degenerative changes of the right knee are noted. Small joint effusion is seen. No acute fracture or dislocation is noted. IMPRESSION: Tricompartmental degenerative changes similar to that seen on  the prior exam. Electronically Signed   By: Oneil Devonshire M.D.   On: 09/30/2024 01:04     PROCEDURES:  Critical Care performed: No      Procedures    IMPRESSION / MDM / ASSESSMENT AND PLAN / ED COURSE  I reviewed the triage vital signs and the nursing notes.    Patient here for concerns  for cellulitis of the right lower extremity.  The patient is on the cardiac monitor to evaluate for evidence of arrhythmia and/or significant heart rate changes.   DIFFERENTIAL DIAGNOSIS (includes but not limited to):   Cellulitis, DVT, doubt septic arthritis, gout.  No signs of arterial obstruction.  No sign of compartment syndrome.   Patient's presentation is most consistent with acute presentation with potential threat to life or bodily function.   PLAN: Will obtain labs, venous Doppler of the right leg.  Will start oral antibiotics and give pain medication.  He has no joint effusion on my exam and x-ray of the right knee also shows no bony abnormality or joint effusion when reviewed and interpreted by myself and the radiologist.  His pain seems actually distal to this joint and is mostly involving the shin, calf and dorsal foot.  I feel given the extent of his cellulitis on exam and his multiple comorbidities and level of pain keeping him from ambulating that he will need admission.   MEDICATIONS GIVEN IN ED: Medications  0.9 %  sodium chloride  infusion ( Intravenous New Bag/Given 09/30/24 0145)  vancomycin  (VANCOCIN ) IVPB 1000 mg/200 mL premix (has no administration in time range)    Followed by  vancomycin  (VANCOREADY) IVPB 1500 mg/300 mL (has no administration in time range)  cefTRIAXone  (ROCEPHIN ) 2 g in sodium chloride  0.9 % 100 mL IVPB (0 g Intravenous Stopped 09/30/24 0228)  morphine  (PF) 4 MG/ML injection 4 mg (4 mg Intravenous Given 09/30/24 0144)  ondansetron  (ZOFRAN ) injection 4 mg (4 mg Intravenous Given 09/30/24 0144)     ED COURSE: Labs reassuring.  No leukocytosis.   Normal electrolytes.  Negative lactic.  Venous Doppler reviewed and interpreted by myself and the radiologist and shows no DVT.  Will discuss with hospitalist for admission.   CONSULTS:  Consulted and discussed patient's case with hospitalist, Dr. Cleatus.  I have recommended admission and consulting physician agrees and will place admission orders.  Patient (and family if present) agree with this plan.   I reviewed all nursing notes, vitals, pertinent previous records.  All labs, EKGs, imaging ordered have been independently reviewed and interpreted by myself.    OUTSIDE RECORDS REVIEWED: Reviewed recent family medicine and cardiology notes.       FINAL CLINICAL IMPRESSION(S) / ED DIAGNOSES   Final diagnoses:  Cellulitis of right lower extremity     Rx / DC Orders   ED Discharge Orders     None        Note:  This document was prepared using Dragon voice recognition software and may include unintentional dictation errors.   Sumner Boesch, Josette SAILOR, DO 09/30/24 [phone]

## 2024-10-01 DIAGNOSIS — L03115 Cellulitis of right lower limb: Secondary | ICD-10-CM | POA: Diagnosis not present

## 2024-10-01 LAB — RENAL FUNCTION PANEL
Albumin: 3 g/dL — ABNORMAL LOW (ref 3.5–5.0)
Anion gap: 6 (ref 5–15)
BUN: 22 mg/dL — ABNORMAL HIGH (ref 6–20)
CO2: 25 mmol/L (ref 22–32)
Calcium: 8.3 mg/dL — ABNORMAL LOW (ref 8.9–10.3)
Chloride: 104 mmol/L (ref 98–111)
Creatinine, Ser: 1.2 mg/dL (ref 0.61–1.24)
GFR, Estimated: >60 mL/min (ref >60)
Glucose, Bld: 155 mg/dL — ABNORMAL HIGH (ref 70–99)
Phosphorus: 3.2 mg/dL (ref 2.5–4.6)
Potassium: 4.4 mmol/L (ref 3.5–5.1)
Sodium: 135 mmol/L (ref 135–145)

## 2024-10-01 LAB — MRSA NEXT GEN BY PCR, NASAL: MRSA by PCR Next Gen: NOT DETECTED

## 2024-10-01 LAB — MAGNESIUM: Magnesium: 2 mg/dL (ref 1.7–2.4)

## 2024-10-01 NOTE — Evaluation (Signed)
 Physical Therapy Evaluation Patient Details Name: James Lambert MRN: [id number] DOB: 1966-07-06 Today's Date: 10/01/2024  History of Present Illness  Pt is a 58 y/o M presenting to ED with c/o R knee pain, swelling, redness. US  RLE negative for DVT. MD assessment includes RLE cellulitis, ambulatory dysfunction secondary to cellulitis. PMH significant for alcohol use disorder, HTN, HLD, hx of CVA, COPD.   Clinical Impression  Pt A&Ox4, agreeable to PT evaluation. At baseline, pt reports being modI with ambulation with SPC or RW, IND with ADLs, reports 2 recent falls where he did not have his walker close enough when getting out of bed. Pt was received in bed, mobility limited by pt's R knee pain throughout session. Pt required modAx2 for bed mobility, performed 2 supine <> sit transfers. Pt able to tolerate sitting at EOB for ~10 minutes during session with good sitting balance. Pt performed STS from heavily elevated EOB, instructed on standing on primarily LLE due to poor WB tolerance on RLE due to pain. Attempted to have pt try WB on RLE in standing, pt only able to tolerate toe-touch WB due to pain. Attempted to have pt shuffle L foot to shift weight toward HOB, pt with minimal ability to shift weight with cues for offloading BLE with BUE support on RW. Pt able to laterally scoot hips toward HOB, very effortful to complete task. Pt was left sitting at EOB, positioned to eat lunch with all needs in reach- RN informed. Pt would benefit from skilled PT intervention to address listed deficits (see PT Problem List) and maximize independence with functional mobility/ADLs.       If plan is discharge home, recommend the following: A lot of help with walking and/or transfers;A lot of help with bathing/dressing/bathroom;Assist for transportation   Can travel by private vehicle   No    Equipment Recommendations Other (comment) (TBD)  Recommendations for Other Services  OT consult    Functional Status  Assessment Patient has had a recent decline in their functional status and demonstrates the ability to make significant improvements in function in a reasonable and predictable amount of time.     Precautions / Restrictions Precautions Precautions: Fall Recall of Precautions/Restrictions: Impaired Restrictions Weight Bearing Restrictions Per Provider Order: No      Mobility  Bed Mobility Overal bed mobility: Needs Assistance Bed Mobility: Supine to Sit, Sit to Supine     Supine to sit: Mod assist, +2 for physical assistance, HOB elevated Sit to supine: Mod assist, +2 for physical assistance   General bed mobility comments: pt performed 2 supine <> sit transfer with modAx2 trunk/BLE assist, HOB elevated to assist with trunk elevation.    Transfers Overall transfer level: Needs assistance Equipment used: Rolling walker (2 wheels) Transfers: Sit to/from Stand Sit to Stand: Min assist, +2 safety/equipment, From elevated surface, Mod assist          Lateral/Scoot Transfers: Contact guard assist, From elevated surface General transfer comment: STS from very elevated EOB with minAx2. Pt instructed on STS with only LLE to limit WB on RLE due to pain. Pt required modAx2 to maintain standing on LLE. Able to laterally scoot hips toward HOB with LLE on ground- very effortful    Ambulation/Gait               General Gait Details: Attempted to have pt shuffle his L foot to shuffle toward HOB, minimal ability to shift LLE in standing with RW  Stairs  Wheelchair Mobility     Tilt Bed    Modified Rankin (Stroke Patients Only)       Balance Overall balance assessment: Needs assistance Sitting-balance support: Feet supported Sitting balance-Leahy Scale: Good Sitting balance - Comments: steady static and dynamic sitting, able to reach within BOS   Standing balance support: Bilateral upper extremity supported, Reliant on assistive device for balance Standing  balance-Leahy Scale: Fair Standing balance comment: heavy BUE support in standing, modAx2 to maintain standing                             Pertinent Vitals/Pain Pain Assessment Pain Assessment: Faces Faces Pain Scale: Hurts whole lot Pain Location: R knee Pain Descriptors / Indicators: Grimacing, Sore Pain Intervention(s): Limited activity within patient's tolerance, Monitored during session, Repositioned    Home Living Family/patient expects to be discharged to:: Shelter/Homeless                   Additional Comments: Pt states that he has been living in a boarding house, awaiting Section 8 housing    Prior Function Prior Level of Function : Independent/Modified Independent             Mobility Comments: Pt states that he was ambulatory with SPC or RW prior to admission, reports 2 recent falls where he was trying to get out of bed and RW was not close by. ADLs Comments: IND with ADLs     Extremity/Trunk Assessment   Upper Extremity Assessment Upper Extremity Assessment: Overall WFL for tasks assessed    Lower Extremity Assessment Lower Extremity Assessment: Generalized weakness RLE Deficits / Details: R knee and lower leg swollen and warm to touch. Pain with any active movement of RLE       Communication   Communication Communication: No apparent difficulties    Cognition Arousal: Alert Behavior During Therapy: WFL for tasks assessed/performed   PT - Cognitive impairments: No apparent impairments                       PT - Cognition Comments: A&Ox4 Following commands: Intact       Cueing       General Comments      Exercises     Assessment/Plan    PT Assessment Patient needs continued PT services  PT Problem List Decreased strength;Decreased range of motion;Decreased activity tolerance;Decreased balance;Decreased mobility;Decreased coordination;Pain       PT Treatment Interventions DME instruction;Gait training;Stair  training;Functional mobility training;Therapeutic activities;Therapeutic exercise;Balance training;Neuromuscular re-education;Cognitive remediation;Patient/family education    PT Goals (Current goals can be found in the Care Plan section)  Acute Rehab PT Goals Patient Stated Goal: to decrease pain PT Goal Formulation: With patient Time For Goal Achievement: 10/15/24 Potential to Achieve Goals: Fair    Frequency Min 2X/week     Co-evaluation               AM-PAC PT 6 Clicks Mobility  Outcome Measure Help needed turning from your back to your side while in a flat bed without using bedrails?: A Lot Help needed moving from lying on your back to sitting on the side of a flat bed without using bedrails?: A Lot Help needed moving to and from a bed to a chair (including a wheelchair)?: A Lot Help needed standing up from a chair using your arms (e.g., wheelchair or bedside chair)?: A Lot Help needed to walk in hospital room?: Total Help  needed climbing 3-5 steps with a railing? : Total 6 Click Score: 10    End of Session Equipment Utilized During Treatment: Gait belt Activity Tolerance: Patient limited by pain Patient left: in bed;with call bell/phone within reach;with bed alarm set (pt sitting at EOB to eat lunch- RN informed) Nurse Communication: Mobility status PT Visit Diagnosis: Unsteadiness on feet (R26.81);Muscle weakness (generalized) (M62.81);Difficulty in walking, not elsewhere classified (R26.2);Pain Pain - Right/Left: Right Pain - part of body: Knee    Time: 8676-8654 PT Time Calculation (min) (ACUTE ONLY): 22 min   Charges:   PT Evaluation $PT Eval Moderate Complexity: 1 Mod   PT General Charges $$ ACUTE PT VISIT: 1 Visit         Janell Axe, SPT

## 2024-10-01 NOTE — Progress Notes (Addendum)
 Progress Note    [name]  FMW:[id number] DOB: 04/11/1966  DOA: 09/30/2024 PCP: [name], NP      Brief Narrative:    Medical records reviewed and are as summarized below:  James Lambert is a 58 y.o. male with past medical history of alcohol use disorder, hypertension, hyperlipidemia and history of CVA and COPD, who presented to the hospital with redness, swelling and pain of the right leg and right foot.  There was also some redness to the right foot.  Symptoms have progressively worsened and he had difficulty ambulating because of this.  Vitals in the ED: Temperature 98.2 F, respiratory 20, pulse 91, BP 154/82, O2 sat 100% on room air.  Labs: WBC 10.4, hemoglobin 11.0, BUN 34, creatinine 1.28, lactic acid 1.6.  Venous duplex of the right lower extremity was negative for DVT  He was admitted to the hospital for right lower extremity cellulitis.   Assessment/Plan:   Principal Problem:   Cellulitis Active Problems:   History of CVA (cerebrovascular accident)   Hypertension   Mixed hyperlipidemia   Body mass index is 31.99 kg/m.  (Class I obesity)   Right lower extremity cellulitis: Continue IV cefazolin .  Analgesics as needed for pain.   Hypertension: Continue amlodipine  and metoprolol . Olmesartan  and HCTZ on hold   Alcohol use disorder: Ativan  as needed per CIWA protocol.  Continue thiamine  and folate supplement.  He said his last drink was about 3 days prior to admission.   General weakness, ambulatory dysfunction: PT recommended discharge to SNF.   COPD: Stable.  Continue bronchodilators.   History of stroke: Continue Plavix  and Lipitor    Diet Order             Diet Heart Room service appropriate? Yes; Fluid consistency: Thin  Diet effective now                                  Consultants: None  Procedures: None    Medications:    amLODipine   10 mg Oral Daily   atorvastatin   80 mg Oral Daily    clopidogrel   75 mg Oral Daily   cyanocobalamin   1,000 mcg Oral Daily   enoxaparin  (LOVENOX ) injection  0.5 mg/kg Subcutaneous Q24H   folic acid   1 mg Oral Daily   metoprolol  succinate  50 mg Oral Daily   multivitamin with minerals  1 tablet Oral Daily   nicotine   21 mg Transdermal Daily   pantoprazole   40 mg Oral QHS   thiamine   100 mg Oral Daily   Or   thiamine   100 mg Intravenous Daily   Continuous Infusions:   ceFAZolin  (ANCEF ) IV 2 g (10/01/24 0819)     Anti-infectives (From admission, onward)    Start     Dose/Rate Route Frequency Ordered Stop   10/01/24 0200  cefTRIAXone  (ROCEPHIN ) 1 g in sodium chloride  0.9 % 100 mL IVPB  Status:  Discontinued        1 g 200 mL/hr over 30 Minutes Intravenous Every 24 hours 09/30/24 0326 09/30/24 0819   09/30/24 0900  ceFAZolin  (ANCEF ) IVPB 2g/100 mL premix        2 g 200 mL/hr over 30 Minutes Intravenous Every 8 hours 09/30/24 0810 10/07/24 0859   09/30/24 0615  vancomycin  (VANCOCIN ) IVPB 1000 mg/200 mL premix  Status:  Discontinued       Placed in Followed by Linked  Group   1,000 mg 200 mL/hr over 60 Minutes Intravenous  Once 09/30/24 0529 10/01/24 0859   09/30/24 0615  vancomycin  (VANCOREADY) IVPB 1500 mg/300 mL  Status:  Discontinued       Placed in Followed by Linked Group   1,500 mg 150 mL/hr over 120 Minutes Intravenous  Once 09/30/24 0529 10/01/24 0859   09/30/24 0130  cefTRIAXone  (ROCEPHIN ) 2 g in sodium chloride  0.9 % 100 mL IVPB        2 g 200 mL/hr over 30 Minutes Intravenous  Once 09/30/24 0116 09/30/24 0228   09/30/24 0130  vancomycin  (VANCOCIN ) IVPB 1000 mg/200 mL premix  Status:  Discontinued       Placed in Followed by Linked Group   1,000 mg 200 mL/hr over 60 Minutes Intravenous  Once 09/30/24 0128 09/30/24 0530   09/30/24 0130  vancomycin  (VANCOREADY) IVPB 1500 mg/300 mL  Status:  Discontinued       Placed in Followed by Linked Group   1,500 mg 150 mL/hr over 120 Minutes Intravenous  Once 09/30/24 0128  09/30/24 0530              Family Communication/Anticipated D/C date and plan/Code Status   DVT prophylaxis:      Code Status: Full Code  Family Communication: None Disposition Plan: Plan to discharge to SNF   Status is: Observation The patient will require care spanning > 2 midnights and should be moved to inpatient because: Right lower extremity cellulitis, ambulatory dysfunction       Subjective:   Interval events noted.  He complains of pain in the right leg and foot.  2 days assistants were at the bedside.  Objective:    Vitals:   09/30/24 0732 09/30/24 1605 09/30/24 2007 10/01/24 0443  BP: 122/67 124/82 123/68 (!) 142/86  Pulse: 81 100 76 74  Resp: 17 17 18 18   Temp: 98.3 F (36.8 C) 98.9 F (37.2 C) 98.5 F (36.9 C) 98 F (36.7 C)  TempSrc:      SpO2: 99% 99% 99% 100%  Weight:      Height:       No data found.   Intake/Output Summary (Last 24 hours) at 10/01/2024 1526 Last data filed at 10/01/2024 0959 Gross per 24 hour  Intake 100 ml  Output 900 ml  Net -800 ml   Filed Weights   09/29/24 2352 09/30/24 0001  Weight: 113.4 kg 113 kg    Exam:  GEN: NAD SKIN: Warm and dry EYES: No pallor or icterus ENT: MMM CV: RRR PULM: CTA B ABD: soft, ND, NT, +BS CNS: AAO x 3, non focal EXT: Swelling and tenderness of right leg and right foot.  Some erythema noted on the right dorsal foot.       Data Reviewed:   I have personally reviewed following labs and imaging studies:  Labs: Labs show the following:   Basic Metabolic Panel: Recent Labs  Lab 09/30/24 0141 10/01/24 1250  NA 135 135  K 4.0 4.4  CL 100 104  CO2 23 25  GLUCOSE 151* 155*  BUN 34* 22*  CREATININE 1.28* 1.20  CALCIUM  8.7* 8.3*  MG  --  2.0  PHOS  --  3.2   GFR Estimated Creatinine Clearance: 90.8 mL/min (by C-G formula based on SCr of 1.2 mg/dL). Liver Function Tests: Recent Labs  Lab 09/30/24 0141 10/01/24 1250  AST 21  --   ALT 13  --   ALKPHOS  63  --  BILITOT 0.3  --   PROT 8.2*  --   ALBUMIN 3.5 3.0*   No results for input(s): LIPASE, AMYLASE in the last 168 hours. No results for input(s): AMMONIA in the last 168 hours. Coagulation profile No results for input(s): INR, PROTIME in the last 168 hours.  CBC: Recent Labs  Lab 09/30/24 0141  WBC 10.4  NEUTROABS 7.5  HGB 11.0*  HCT 34.1*  MCV 94.5  PLT 383   Cardiac Enzymes: No results for input(s): CKTOTAL, CKMB, CKMBINDEX, TROPONINI in the last 168 hours. BNP (last 3 results) No results for input(s): PROBNP in the last 8760 hours. CBG: No results for input(s): GLUCAP in the last 168 hours. D-Dimer: No results for input(s): DDIMER in the last 72 hours. Hgb A1c: No results for input(s): HGBA1C in the last 72 hours. Lipid Profile: No results for input(s): CHOL, HDL, LDLCALC, TRIG, CHOLHDL, LDLDIRECT in the last 72 hours. Thyroid function studies: No results for input(s): TSH, T4TOTAL, T3FREE, THYROIDAB in the last 72 hours.  Invalid input(s): FREET3 Anemia work up: No results for input(s): VITAMINB12, FOLATE, FERRITIN, TIBC, IRON, RETICCTPCT in the last 72 hours. Sepsis Labs: Recent Labs  Lab 09/30/24 0141  WBC 10.4  LATICACIDVEN 1.6    Microbiology Recent Results (from the past 240 hours)  Blood culture (routine x 2)     Status: None (Preliminary result)   Collection Time: 09/30/24  1:41 AM   Specimen: BLOOD  Result Value Ref Range Status   Specimen Description BLOOD BLOOD LEFT ARM  Final   Special Requests   Final    BOTTLES DRAWN AEROBIC AND ANAEROBIC Blood Culture adequate volume   Culture   Final    NO GROWTH 1 DAY Performed at [facility], [address], [place], [place] [address]    Report Status PENDING  Incomplete  Blood culture (routine x 2)     Status: None (Preliminary result)   Collection Time: 09/30/24  2:31 AM   Specimen: BLOOD  Result Value Ref Range Status    Specimen Description BLOOD BLOOD RIGHT ARM  Final   Special Requests   Final    BOTTLES DRAWN AEROBIC AND ANAEROBIC Blood Culture results may not be optimal due to an inadequate volume of blood received in culture bottles   Culture   Final    NO GROWTH 1 DAY Performed at [facility] [place], [address] [place], [place] [address]    Report Status PENDING  Incomplete  MRSA Next Gen by PCR, Nasal     Status: None   Collection Time: 10/01/24 11:16 AM   Specimen: Nasal Mucosa; Nasal Swab  Result Value Ref Range Status   MRSA by PCR Next Gen NOT DETECTED NOT DETECTED Final    Comment: (NOTE) The GeneXpert MRSA Assay (FDA approved for NASAL specimens only), is one component of a comprehensive MRSA colonization surveillance program. It is not intended to diagnose MRSA infection nor to guide or monitor treatment for MRSA infections. Test performance is not FDA approved in patients less than 70 years old. Performed at [facility], [address] [place], [place] [address]     Procedures and diagnostic studies:  US  Venous Img Lower Unilateral Right Result Date: 09/30/2024 CLINICAL DATA:  Right leg pain and swelling EXAM: RIGHT LOWER EXTREMITY VENOUS DOPPLER ULTRASOUND TECHNIQUE: Gray-scale sonography with graded compression, as well as color Doppler and duplex ultrasound were performed to evaluate the lower extremity deep venous systems from the level of the common  femoral vein and including the common femoral, femoral, profunda femoral, popliteal and calf veins including the posterior tibial, peroneal and gastrocnemius veins when visible. The superficial great saphenous vein was also interrogated. Spectral Doppler was utilized to evaluate flow at rest and with distal augmentation maneuvers in the common femoral, femoral and popliteal veins. COMPARISON:  None Available. FINDINGS: Contralateral Common Femoral Vein: Respiratory phasicity is normal and symmetric with the symptomatic  side. No evidence of thrombus. Normal compressibility. Common Femoral Vein: No evidence of thrombus. Normal compressibility, respiratory phasicity and response to augmentation. Saphenofemoral Junction: No evidence of thrombus. Normal compressibility and flow on color Doppler imaging. Profunda Femoral Vein: No evidence of thrombus. Normal compressibility and flow on color Doppler imaging. Femoral Vein: No evidence of thrombus. Normal compressibility, respiratory phasicity and response to augmentation. Popliteal Vein: No evidence of thrombus. Normal compressibility, respiratory phasicity and response to augmentation. Calf Veins: No evidence of thrombus. Normal compressibility and flow on color Doppler imaging. Superficial Great Saphenous Vein: No evidence of thrombus. Normal compressibility. Venous Reflux:  None. Other Findings: Generalized edema in the right lower extremity is noted. IMPRESSION: No evidence of deep venous thrombosis. Electronically Signed   By: Oneil Devonshire M.D.   On: 09/30/2024 02:34   DG Knee 1-2 Views Right Result Date: 09/30/2024 CLINICAL DATA:  Right knee pain for 2 days, no known injury, initial encounter EXAM: RIGHT KNEE - 2 VIEW COMPARISON:  04/24/2023 FINDINGS: Tricompartmental degenerative changes of the right knee are noted. Small joint effusion is seen. No acute fracture or dislocation is noted. IMPRESSION: Tricompartmental degenerative changes similar to that seen on the prior exam. Electronically Signed   By: Oneil Devonshire M.D.   On: 09/30/2024 01:04               LOS: 0 days   Pinchus Weckwerth  [facility]   Pager on [email or web address]. If 7PM-7AM, please contact night-coverage at [email or web address]     10/01/2024, 3:26 PM

## 2024-10-01 NOTE — Plan of Care (Signed)
  Problem: Clinical Measurements: Goal: Respiratory complications will improve Outcome: Progressing   Problem: Nutrition: Goal: Adequate nutrition will be maintained Outcome: Progressing   Problem: Coping: Goal: Level of anxiety will decrease Outcome: Progressing   Problem: Elimination: Goal: Will not experience complications related to urinary retention Outcome: Progressing   Problem: Safety: Goal: Ability to remain free from injury will improve Outcome: Progressing   

## 2024-10-02 ENCOUNTER — Inpatient Hospital Stay

## 2024-10-02 DIAGNOSIS — F1010 Alcohol abuse, uncomplicated: Secondary | ICD-10-CM | POA: Diagnosis present

## 2024-10-02 DIAGNOSIS — Z79899 Other long term (current) drug therapy: Secondary | ICD-10-CM | POA: Diagnosis not present

## 2024-10-02 DIAGNOSIS — Z59869 Financial insecurity, unspecified: Secondary | ICD-10-CM | POA: Diagnosis not present

## 2024-10-02 DIAGNOSIS — Z791 Long term (current) use of non-steroidal anti-inflammatories (NSAID): Secondary | ICD-10-CM | POA: Diagnosis not present

## 2024-10-02 DIAGNOSIS — S92401A Displaced unspecified fracture of right great toe, initial encounter for closed fracture: Secondary | ICD-10-CM | POA: Diagnosis present

## 2024-10-02 DIAGNOSIS — M1711 Unilateral primary osteoarthritis, right knee: Secondary | ICD-10-CM | POA: Diagnosis present

## 2024-10-02 DIAGNOSIS — K5900 Constipation, unspecified: Secondary | ICD-10-CM | POA: Diagnosis not present

## 2024-10-02 DIAGNOSIS — E782 Mixed hyperlipidemia: Secondary | ICD-10-CM | POA: Diagnosis present

## 2024-10-02 DIAGNOSIS — K219 Gastro-esophageal reflux disease without esophagitis: Secondary | ICD-10-CM | POA: Diagnosis present

## 2024-10-02 DIAGNOSIS — Z6831 Body mass index (BMI) 31.0-31.9, adult: Secondary | ICD-10-CM | POA: Diagnosis not present

## 2024-10-02 DIAGNOSIS — M7989 Other specified soft tissue disorders: Secondary | ICD-10-CM | POA: Diagnosis present

## 2024-10-02 DIAGNOSIS — J4489 Other specified chronic obstructive pulmonary disease: Secondary | ICD-10-CM | POA: Diagnosis present

## 2024-10-02 DIAGNOSIS — I69351 Hemiplegia and hemiparesis following cerebral infarction affecting right dominant side: Secondary | ICD-10-CM | POA: Diagnosis not present

## 2024-10-02 DIAGNOSIS — L0390 Cellulitis, unspecified: Secondary | ICD-10-CM | POA: Diagnosis present

## 2024-10-02 DIAGNOSIS — Z7902 Long term (current) use of antithrombotics/antiplatelets: Secondary | ICD-10-CM | POA: Diagnosis not present

## 2024-10-02 DIAGNOSIS — Z8673 Personal history of transient ischemic attack (TIA), and cerebral infarction without residual deficits: Secondary | ICD-10-CM | POA: Diagnosis not present

## 2024-10-02 DIAGNOSIS — W19XXXA Unspecified fall, initial encounter: Secondary | ICD-10-CM | POA: Diagnosis present

## 2024-10-02 DIAGNOSIS — F17210 Nicotine dependence, cigarettes, uncomplicated: Secondary | ICD-10-CM | POA: Diagnosis present

## 2024-10-02 DIAGNOSIS — E66811 Obesity, class 1: Secondary | ICD-10-CM | POA: Diagnosis present

## 2024-10-02 DIAGNOSIS — Z8249 Family history of ischemic heart disease and other diseases of the circulatory system: Secondary | ICD-10-CM | POA: Diagnosis not present

## 2024-10-02 DIAGNOSIS — Y92009 Unspecified place in unspecified non-institutional (private) residence as the place of occurrence of the external cause: Secondary | ICD-10-CM | POA: Diagnosis not present

## 2024-10-02 DIAGNOSIS — L03115 Cellulitis of right lower limb: Secondary | ICD-10-CM | POA: Diagnosis present

## 2024-10-02 DIAGNOSIS — I10 Essential (primary) hypertension: Secondary | ICD-10-CM | POA: Diagnosis present

## 2024-10-02 MED ORDER — GADOBUTROL 1 MMOL/ML IV SOLN
10.0000 mL | Freq: Once | INTRAVENOUS | Status: AC | PRN
Start: 2024-10-02 — End: 2024-10-02
  Administered 2024-10-02: 10 mL via INTRAVENOUS

## 2024-10-02 MED ORDER — POLYETHYLENE GLYCOL 3350 17 G PO PACK
17.0000 g | PACK | Freq: Every day | ORAL | Status: DC
  Administered 2024-10-03 – 2024-10-06 (×2): 17 g via ORAL
  Filled 2024-10-02 (×6): qty 1

## 2024-10-02 MED ORDER — SENNOSIDES-DOCUSATE SODIUM 8.6-50 MG PO TABS: 1.0000 | ORAL_TABLET | Freq: Every evening | ORAL | Status: DC | PRN

## 2024-10-02 NOTE — Progress Notes (Signed)
 Mobility Specialist Progress Note:    10/02/24 1650  Mobility  Activity Stood at bedside (sit to stand with mobility specialist)  Level of Assistance Moderate assist, patient does 50-74%  Assistive Device Front wheel walker  Range of Motion/Exercises Active;All extremities  Activity Response Tolerated well  Mobility visit 1 Mobility  Mobility Specialist Start Time (ACUTE ONLY) 1636  Mobility Specialist Stop Time (ACUTE ONLY) 1650  Mobility Specialist Time Calculation (min) (ACUTE ONLY) 14 min   Pt received sitting EOB, requesting mobility. NT in room for assistance to stand. Required ModA +2 to perform a half STS. Pt limited d/t RLE pain. Left pt supine, alarm on. All needs met.  Sherrilee Ditty Mobility Specialist Please contact via Special Educational Needs Teacher or  Rehab office at [phone]

## 2024-10-02 NOTE — Plan of Care (Signed)

## 2024-10-02 NOTE — Progress Notes (Signed)
 Progress Note    James Lambert  FMW:[id number] DOB: 1965-12-11  DOA: 09/30/2024 PCP: [name], NP      Brief Narrative:    Medical records reviewed and are as summarized below:  James Lambert is a 58 y.o. male with past medical history of alcohol use disorder, hypertension, hyperlipidemia and history of CVA and COPD, who presented to the hospital with redness, swelling and pain of the right leg and right foot.  There was also some redness to the right foot.  Symptoms have progressively worsened and he had difficulty ambulating because of this.  Vitals in the ED: Temperature 98.2 F, respiratory 20, pulse 91, BP 154/82, O2 sat 100% on room air.  Labs: WBC 10.4, hemoglobin 11.0, BUN 34, creatinine 1.28, lactic acid 1.6.  Venous duplex of the right lower extremity was negative for DVT  He was admitted to the hospital for right lower extremity cellulitis.   Assessment/Plan:   Principal Problem:   Cellulitis Active Problems:   History of CVA (cerebrovascular accident)   Hypertension   Mixed hyperlipidemia   Body mass index is 31.99 kg/m.  (Class I obesity)   Right lower extremity cellulitis (leg and foot): MRI with and without contrast of right foot has been ordered for further evaluation.  Continue IV cefazolin .  Analgesics as needed for pain.   Hypertension: Continue amlodipine  and metoprolol . Olmesartan  and HCTZ on hold   Alcohol use disorder: Ativan  as needed per CIWA protocol.  Continue thiamine  and folate supplement.  He said his last drink was about 3 days prior to admission.   General weakness, ambulatory dysfunction: PT recommended discharge to SNF.   COPD: Stable.  Continue bronchodilators.   History of stroke: Continue Plavix  and Lipitor    Diet Order             Diet Heart Room service appropriate? Yes; Fluid consistency: Thin  Diet effective now                                   Consultants: None  Procedures: None    Medications:    amLODipine   10 mg Oral Daily   atorvastatin   80 mg Oral Daily   clopidogrel   75 mg Oral Daily   cyanocobalamin   1,000 mcg Oral Daily   enoxaparin  (LOVENOX ) injection  0.5 mg/kg Subcutaneous Q24H   folic acid   1 mg Oral Daily   metoprolol  succinate  50 mg Oral Daily   multivitamin with minerals  1 tablet Oral Daily   nicotine   21 mg Transdermal Daily   pantoprazole   40 mg Oral QHS   polyethylene glycol  17 g Oral Daily   thiamine   100 mg Oral Daily   Or   thiamine   100 mg Intravenous Daily   Continuous Infusions:   ceFAZolin  (ANCEF ) IV 2 g (10/02/24 1027)     Anti-infectives (From admission, onward)    Start     Dose/Rate Route Frequency Ordered Stop   10/01/24 0200  cefTRIAXone  (ROCEPHIN ) 1 g in sodium chloride  0.9 % 100 mL IVPB  Status:  Discontinued        1 g 200 mL/hr over 30 Minutes Intravenous Every 24 hours 09/30/24 0326 09/30/24 0819   09/30/24 0900  ceFAZolin  (ANCEF ) IVPB 2g/100 mL premix        2 g 200 mL/hr over 30 Minutes Intravenous Every 8 hours 09/30/24 0810 10/07/24 0859  09/30/24 0615  vancomycin  (VANCOCIN ) IVPB 1000 mg/200 mL premix  Status:  Discontinued       Placed in Followed by Linked Group   1,000 mg 200 mL/hr over 60 Minutes Intravenous  Once 09/30/24 0529 10/01/24 0859   09/30/24 0615  vancomycin  (VANCOREADY) IVPB 1500 mg/300 mL  Status:  Discontinued       Placed in Followed by Linked Group   1,500 mg 150 mL/hr over 120 Minutes Intravenous  Once 09/30/24 0529 10/01/24 0859   09/30/24 0130  cefTRIAXone  (ROCEPHIN ) 2 g in sodium chloride  0.9 % 100 mL IVPB        2 g 200 mL/hr over 30 Minutes Intravenous  Once 09/30/24 0116 09/30/24 0228   09/30/24 0130  vancomycin  (VANCOCIN ) IVPB 1000 mg/200 mL premix  Status:  Discontinued       Placed in Followed by Linked Group   1,000 mg 200 mL/hr over 60 Minutes Intravenous  Once 09/30/24 0128 09/30/24  0530   09/30/24 0130  vancomycin  (VANCOREADY) IVPB 1500 mg/300 mL  Status:  Discontinued       Placed in Followed by Linked Group   1,500 mg 150 mL/hr over 120 Minutes Intravenous  Once 09/30/24 0128 09/30/24 0530              Family Communication/Anticipated D/C date and plan/Code Status   DVT prophylaxis:      Code Status: Full Code  Family Communication: None Disposition Plan: Plan to discharge to SNF   Status is: Observation The patient will require care spanning > 2 midnights and should be moved to inpatient because: Right lower extremity cellulitis, ambulatory dysfunction       Subjective:   Interval events noted.  He complains of severe pain in the right foot.  He also requested laxative to help him move his bowels because of constipation.  2 nurse assistants were at the bedside.  Objective:    Vitals:   10/01/24 0443 10/01/24 1949 10/02/24 0438 10/02/24 1143  BP: (!) 142/86 108/62 115/61 (!) 148/72  Pulse: 74 71 72 71  Resp: 18 16 16 16   Temp: 98 F (36.7 C) 98.2 F (36.8 C) 98.7 F (37.1 C) 98.7 F (37.1 C)  TempSrc:  Oral    SpO2: 100% 100% 99% 100%  Weight:      Height:       No data found.   Intake/Output Summary (Last 24 hours) at 10/02/2024 1425 Last data filed at 10/02/2024 1412 Gross per 24 hour  Intake 720 ml  Output 1850 ml  Net -1130 ml   Filed Weights   09/29/24 2352 09/30/24 0001  Weight: 113.4 kg 113 kg    Exam:  GEN: NAD SKIN: Warm and dry.  Scabbed wound to the right mid anterior leg. EYES: No pallor or icterus ENT: MMM CV: RRR PULM: CTA B ABD: soft, ND, NT, +BS CNS: AAO x 3, non focal EXT: Persistent swelling and significant tenderness right foot.  Right leg is also swollen.      Data Reviewed:   I have personally reviewed following labs and imaging studies:  Labs: Labs show the following:   Basic Metabolic Panel: Recent Labs  Lab 09/30/24 0141 10/01/24 1250  NA 135 135  K 4.0 4.4  CL 100  104  CO2 23 25  GLUCOSE 151* 155*  BUN 34* 22*  CREATININE 1.28* 1.20  CALCIUM  8.7* 8.3*  MG  --  2.0  PHOS  --  3.2   GFR Estimated  Creatinine Clearance: 90.8 mL/min (by C-G formula based on SCr of 1.2 mg/dL). Liver Function Tests: Recent Labs  Lab 09/30/24 0141 10/01/24 1250  AST 21  --   ALT 13  --   ALKPHOS 63  --   BILITOT 0.3  --   PROT 8.2*  --   ALBUMIN 3.5 3.0*   No results for input(s): LIPASE, AMYLASE in the last 168 hours. No results for input(s): AMMONIA in the last 168 hours. Coagulation profile No results for input(s): INR, PROTIME in the last 168 hours.  CBC: Recent Labs  Lab 09/30/24 0141  WBC 10.4  NEUTROABS 7.5  HGB 11.0*  HCT 34.1*  MCV 94.5  PLT 383   Cardiac Enzymes: No results for input(s): CKTOTAL, CKMB, CKMBINDEX, TROPONINI in the last 168 hours. BNP (last 3 results) No results for input(s): PROBNP in the last 8760 hours. CBG: No results for input(s): GLUCAP in the last 168 hours. D-Dimer: No results for input(s): DDIMER in the last 72 hours. Hgb A1c: No results for input(s): HGBA1C in the last 72 hours. Lipid Profile: No results for input(s): CHOL, HDL, LDLCALC, TRIG, CHOLHDL, LDLDIRECT in the last 72 hours. Thyroid function studies: No results for input(s): TSH, T4TOTAL, T3FREE, THYROIDAB in the last 72 hours.  Invalid input(s): FREET3 Anemia work up: No results for input(s): VITAMINB12, FOLATE, FERRITIN, TIBC, IRON, RETICCTPCT in the last 72 hours. Sepsis Labs: Recent Labs  Lab 09/30/24 0141  WBC 10.4  LATICACIDVEN 1.6    Microbiology Recent Results (from the past 240 hours)  Blood culture (routine x 2)     Status: None (Preliminary result)   Collection Time: 09/30/24  1:41 AM   Specimen: BLOOD  Result Value Ref Range Status   Specimen Description BLOOD BLOOD LEFT ARM  Final   Special Requests   Final    BOTTLES DRAWN AEROBIC AND ANAEROBIC Blood Culture  adequate volume   Culture   Final    NO GROWTH 2 DAYS Performed at [facility], [address], [place], [place] [address]    Report Status PENDING  Incomplete  Blood culture (routine x 2)     Status: None (Preliminary result)   Collection Time: 09/30/24  2:31 AM   Specimen: BLOOD  Result Value Ref Range Status   Specimen Description BLOOD BLOOD RIGHT ARM  Final   Special Requests   Final    BOTTLES DRAWN AEROBIC AND ANAEROBIC Blood Culture results may not be optimal due to an inadequate volume of blood received in culture bottles   Culture   Final    NO GROWTH 2 DAYS Performed at [facility], [address] [place], [place] [address]    Report Status PENDING  Incomplete  MRSA Next Gen by PCR, Nasal     Status: None   Collection Time: 10/01/24 11:16 AM   Specimen: Nasal Mucosa; Nasal Swab  Result Value Ref Range Status   MRSA by PCR Next Gen NOT DETECTED NOT DETECTED Final    Comment: (NOTE) The GeneXpert MRSA Assay (FDA approved for NASAL specimens only), is one component of a comprehensive MRSA colonization surveillance program. It is not intended to diagnose MRSA infection nor to guide or monitor treatment for MRSA infections. Test performance is not FDA approved in patients less than 84 years old. Performed at [facility], [address] [place], [place] [address]     Procedures and diagnostic studies:  No results found.  LOS: 0 days   Emmilynn Marut  [facility]   Pager on [email or web address]. If 7PM-7AM, please contact night-coverage at [email or web address]     10/02/2024, 2:25 PM

## 2024-10-03 ENCOUNTER — Inpatient Hospital Stay

## 2024-10-03 DIAGNOSIS — L03115 Cellulitis of right lower limb: Secondary | ICD-10-CM | POA: Diagnosis not present

## 2024-10-03 MED ORDER — HYDROCHLOROTHIAZIDE 12.5 MG PO TABS
12.5000 mg | ORAL_TABLET | Freq: Every day | ORAL | Status: DC
  Administered 2024-10-03 – 2024-10-07 (×5): 12.5 mg via ORAL
  Filled 2024-10-03 (×5): qty 1

## 2024-10-03 MED ORDER — OLMESARTAN MEDOXOMIL-HCTZ 40-12.5 MG PO TABS: 1.0000 | ORAL_TABLET | Freq: Every day | ORAL | Status: DC

## 2024-10-03 MED ORDER — IRBESARTAN 150 MG PO TABS
300.0000 mg | ORAL_TABLET | Freq: Every day | ORAL | Status: DC
  Administered 2024-10-03 – 2024-10-07 (×5): 300 mg via ORAL
  Filled 2024-10-03 (×5): qty 2

## 2024-10-03 NOTE — Plan of Care (Signed)
  Problem: Health Behavior/Discharge Planning: Goal: Ability to manage health-related needs will improve Outcome: Progressing   Problem: Clinical Measurements: Goal: Ability to maintain clinical measurements within normal limits will improve Outcome: Progressing Goal: Will remain free from infection Outcome: Progressing   Problem: Activity: Goal: Risk for activity intolerance will decrease Outcome: Progressing   Problem: Nutrition: Goal: Adequate nutrition will be maintained Outcome: Progressing   

## 2024-10-03 NOTE — Progress Notes (Addendum)
 Progress Note    James Lambert  FMW:[id number] DOB: August 14, 1966  DOA: 09/30/2024 PCP: [name], NP      Brief Narrative:    Medical records reviewed and are as summarized below:  James Lambert is a 58 y.o. male with past medical history of alcohol use disorder, hypertension, hyperlipidemia and history of CVA and COPD, who presented to the hospital with redness, swelling and pain of the right leg and right foot.  There was also some redness to the right foot.  Symptoms have progressively worsened and he had difficulty ambulating because of this.  Vitals in the ED: Temperature 98.2 F, respiratory 20, pulse 91, BP 154/82, O2 sat 100% on room air.  Labs: WBC 10.4, hemoglobin 11.0, BUN 34, creatinine 1.28, lactic acid 1.6.  Venous duplex of the right lower extremity was negative for DVT  He was admitted to the hospital for right lower extremity cellulitis.   Assessment/Plan:   Principal Problem:   Cellulitis Active Problems:   History of CVA (cerebrovascular accident)   Hypertension   Mixed hyperlipidemia   Body mass index is 31.99 kg/m.  (Class I obesity)   Right lower extremity cellulitis (leg and foot): MRI of the foot did not show any evidence of osteomyelitis, septic arthritis or abscess.  Findings were concerning for probable acute or subacute fracture of the proximal phalanx of the great toe with possible intra-articular extension. Follow-up x-ray of the right great toe has been ordered for further evaluation.  Consulted Dr. Ashley, podiatrist, to assist with management Continue IV cefazolin .  Analgesics as needed for pain.    Hypertension: Continue amlodipine  and metoprolol . Resume olmesartan -HCTZ   Alcohol use disorder: Ativan  as needed per CIWA protocol.  Continue thiamine  and folate supplement.  He said his last drink was about 3 days prior to admission.   General weakness, ambulatory dysfunction, s/p mechanical fall at home (about 5 days  prior to admission): PT recommended discharge to SNF.   COPD: Stable.  Continue bronchodilators.   History of stroke: Continue Plavix  and Lipitor    Diet Order             Diet Heart Room service appropriate? Yes; Fluid consistency: Thin  Diet effective now                                  Consultants: Podiatrist  Procedures: None    Medications:    amLODipine   10 mg Oral Daily   atorvastatin   80 mg Oral Daily   clopidogrel   75 mg Oral Daily   cyanocobalamin   1,000 mcg Oral Daily   enoxaparin  (LOVENOX ) injection  0.5 mg/kg Subcutaneous Q24H   folic acid   1 mg Oral Daily   metoprolol  succinate  50 mg Oral Daily   multivitamin with minerals  1 tablet Oral Daily   nicotine   21 mg Transdermal Daily   olmesartan -hydrochlorothiazide   1 tablet Oral Daily   pantoprazole   40 mg Oral QHS   polyethylene glycol  17 g Oral Daily   thiamine   100 mg Oral Daily   Or   thiamine   100 mg Intravenous Daily   Continuous Infusions:   ceFAZolin  (ANCEF ) IV 2 g (10/03/24 0856)     Anti-infectives (From admission, onward)    Start     Dose/Rate Route Frequency Ordered Stop   10/01/24 0200  cefTRIAXone  (ROCEPHIN ) 1 g in sodium chloride  0.9 % 100  mL IVPB  Status:  Discontinued        1 g 200 mL/hr over 30 Minutes Intravenous Every 24 hours 09/30/24 0326 09/30/24 0819   09/30/24 0900  ceFAZolin  (ANCEF ) IVPB 2g/100 mL premix        2 g 200 mL/hr over 30 Minutes Intravenous Every 8 hours 09/30/24 0810 10/07/24 0859   09/30/24 0615  vancomycin  (VANCOCIN ) IVPB 1000 mg/200 mL premix  Status:  Discontinued       Placed in Followed by Linked Group   1,000 mg 200 mL/hr over 60 Minutes Intravenous  Once 09/30/24 0529 10/01/24 0859   09/30/24 0615  vancomycin  (VANCOREADY) IVPB 1500 mg/300 mL  Status:  Discontinued       Placed in Followed by Linked Group   1,500 mg 150 mL/hr over 120 Minutes Intravenous  Once 09/30/24 0529 10/01/24 0859   09/30/24 0130  cefTRIAXone   (ROCEPHIN ) 2 g in sodium chloride  0.9 % 100 mL IVPB        2 g 200 mL/hr over 30 Minutes Intravenous  Once 09/30/24 0116 09/30/24 0228   09/30/24 0130  vancomycin  (VANCOCIN ) IVPB 1000 mg/200 mL premix  Status:  Discontinued       Placed in Followed by Linked Group   1,000 mg 200 mL/hr over 60 Minutes Intravenous  Once 09/30/24 0128 09/30/24 0530   09/30/24 0130  vancomycin  (VANCOREADY) IVPB 1500 mg/300 mL  Status:  Discontinued       Placed in Followed by Linked Group   1,500 mg 150 mL/hr over 120 Minutes Intravenous  Once 09/30/24 0128 09/30/24 0530              Family Communication/Anticipated D/C date and plan/Code Status   DVT prophylaxis:      Code Status: Full Code  Family Communication: None Disposition Plan: Plan to discharge to SNF   Status is: Inpatient Remains inpatient appropriate because: Right lower extremity cellulitis        Subjective:   Interval events noted.  He complains of persistent swelling and pain of the right foot.  Pain is limiting his mobility.  He said the scab on the right shin wound came off.  No other complaints.  Objective:    Vitals:   10/02/24 1527 10/02/24 2013 10/03/24 0520 10/03/24 0713  BP: 126/67 132/89 130/80 (!) 137/95  Pulse: 77 87 78 64  Resp: 20 18 18 19   Temp: 97.8 F (36.6 C) 98.1 F (36.7 C) 98.2 F (36.8 C) 97.9 F (36.6 C)  TempSrc:    Oral  SpO2: 100% 100% 100% 100%  Weight:      Height:       No data found.   Intake/Output Summary (Last 24 hours) at 10/03/2024 1443 Last data filed at 10/03/2024 1300 Gross per 24 hour  Intake 720 ml  Output 1600 ml  Net -880 ml   Filed Weights   09/29/24 2352 09/30/24 0001  Weight: 113.4 kg 113 kg    Exam:   GEN: NAD SKIN: Warm and dry EYES: No pallor or icterus ENT: MMM CV: RRR PULM: CTA B ABD: soft, ND, NT, +BS CNS: AAO x 3, non focal EXT: Right leg tenderness and edema have improved.  However, he still has significant swelling and  tenderness of the right foot       Data Reviewed:   I have personally reviewed following labs and imaging studies:  Labs: Labs show the following:   Basic Metabolic Panel: Recent Labs  Lab 09/30/24  0141 10/01/24 1250  NA 135 135  K 4.0 4.4  CL 100 104  CO2 23 25  GLUCOSE 151* 155*  BUN 34* 22*  CREATININE 1.28* 1.20  CALCIUM  8.7* 8.3*  MG  --  2.0  PHOS  --  3.2   GFR Estimated Creatinine Clearance: 90.8 mL/min (by C-G formula based on SCr of 1.2 mg/dL). Liver Function Tests: Recent Labs  Lab 09/30/24 0141 10/01/24 1250  AST 21  --   ALT 13  --   ALKPHOS 63  --   BILITOT 0.3  --   PROT 8.2*  --   ALBUMIN 3.5 3.0*   No results for input(s): LIPASE, AMYLASE in the last 168 hours. No results for input(s): AMMONIA in the last 168 hours. Coagulation profile No results for input(s): INR, PROTIME in the last 168 hours.  CBC: Recent Labs  Lab 09/30/24 0141  WBC 10.4  NEUTROABS 7.5  HGB 11.0*  HCT 34.1*  MCV 94.5  PLT 383   Cardiac Enzymes: No results for input(s): CKTOTAL, CKMB, CKMBINDEX, TROPONINI in the last 168 hours. BNP (last 3 results) No results for input(s): PROBNP in the last 8760 hours. CBG: No results for input(s): GLUCAP in the last 168 hours. D-Dimer: No results for input(s): DDIMER in the last 72 hours. Hgb A1c: No results for input(s): HGBA1C in the last 72 hours. Lipid Profile: No results for input(s): CHOL, HDL, LDLCALC, TRIG, CHOLHDL, LDLDIRECT in the last 72 hours. Thyroid function studies: No results for input(s): TSH, T4TOTAL, T3FREE, THYROIDAB in the last 72 hours.  Invalid input(s): FREET3 Anemia work up: No results for input(s): VITAMINB12, FOLATE, FERRITIN, TIBC, IRON, RETICCTPCT in the last 72 hours. Sepsis Labs: Recent Labs  Lab 09/30/24 0141  WBC 10.4  LATICACIDVEN 1.6    Microbiology Recent Results (from the past 240 hours)  Blood culture (routine x  2)     Status: None (Preliminary result)   Collection Time: 09/30/24  1:41 AM   Specimen: BLOOD  Result Value Ref Range Status   Specimen Description BLOOD BLOOD LEFT ARM  Final   Special Requests   Final    BOTTLES DRAWN AEROBIC AND ANAEROBIC Blood Culture adequate volume   Culture   Final    NO GROWTH 3 DAYS Performed at [facility], [address] [place], [place] [address]    Report Status PENDING  Incomplete  Blood culture (routine x 2)     Status: None (Preliminary result)   Collection Time: 09/30/24  2:31 AM   Specimen: BLOOD  Result Value Ref Range Status   Specimen Description BLOOD BLOOD RIGHT ARM  Final   Special Requests   Final    BOTTLES DRAWN AEROBIC AND ANAEROBIC Blood Culture results may not be optimal due to an inadequate volume of blood received in culture bottles   Culture   Final    NO GROWTH 3 DAYS Performed at [facility], [address] [place], [place] [address]    Report Status PENDING  Incomplete  MRSA Next Gen by PCR, Nasal     Status: None   Collection Time: 10/01/24 11:16 AM   Specimen: Nasal Mucosa; Nasal Swab  Result Value Ref Range Status   MRSA by PCR Next Gen NOT DETECTED NOT DETECTED Final    Comment: (NOTE) The GeneXpert MRSA Assay (FDA approved for NASAL specimens only), is one component of a comprehensive MRSA colonization surveillance program. It is not intended to diagnose MRSA infection nor to guide  or monitor treatment for MRSA infections. Test performance is not FDA approved in patients less than 7 years old. Performed at [facility], [address] [place], [place] [address]     Procedures and diagnostic studies:  MR FOOT RIGHT W WO CONTRAST Result Date: 10/03/2024 CLINICAL DATA:  Soft tissue infection suspected, foot, xray done Right leg and foot redness, swelling and pain. Soft tissue infection suspected. EXAM: MRI OF THE RIGHT FOREFOOT WITHOUT AND WITH CONTRAST TECHNIQUE: Multiplanar,  multisequence MR imaging of the right foot was performed before and after the administration of intravenous contrast. CONTRAST:  10mL GADAVIST  GADOBUTROL  1 MMOL/ML IV SOLN COMPARISON:  Lower extremity venous Doppler ultrasound 09/30/2024. No other recent relevant imaging available. FINDINGS: Technical note: Despite efforts by the technologist and patient, mild to moderate motion artifact is present on today's exam and could not be eliminated. This reduces exam sensitivity and specificity. Some sequences were repeated. Bones/Joint/Cartilage Examination was performed with a large field of view which includes most of the foot. Best seen on the sagittal images is a probable fracture involving the proximal phalanx of the great toe (image 24/17). This may demonstrate proximal intra-articular extension. There is no other evidence of acute fracture, dislocation or osteomyelitis. There are moderate degenerative changes at the 1st metatarsophalangeal joint. No other significant arthropathic changes are identified. No significant joint effusions or abnormal synovial enhancement. Ligaments Intact Lisfranc ligament. The collateral ligaments of the metatarsophalangeal joints are intact. Muscles and Tendons No focal muscular abnormalities or abnormal enhancement identified. The ankle tendons appear intact without tenosynovitis. The forefoot tendons appear intact. Soft tissues No obvious focal soft tissue ulceration identified. There is moderate subcutaneous edema throughout the forefoot without organized fluid collection or abnormal enhancement. No foreign bodies are identified. IMPRESSION: 1. Probable acute/subacute fracture of the proximal phalanx of the great toe with possible intra-articular extension. This is suboptimally evaluated due to motion. Recommend plain film correlation. 2. No evidence of osteomyelitis or septic arthritis. 3. Nonspecific subcutaneous edema throughout the forefoot without organized fluid collection or  abnormal enhancement. 4. Moderate degenerative changes at the 1st metatarsophalangeal joint. Electronically Signed   By: Elsie Perone M.D.   On: 10/03/2024 10:28                LOS: 1 day   Zenora Karpel  [facility]   Pager on [email or web address]. If 7PM-7AM, please contact night-coverage at [email or web address]     10/03/2024, 2:43 PM

## 2024-10-03 NOTE — Plan of Care (Signed)
  Problem: Education: Goal: Knowledge of General Education information will improve Description: Including pain rating scale, medication(s)/side effects and non-pharmacologic comfort measures Outcome: Progressing   Problem: Clinical Measurements: Goal: Ability to maintain clinical measurements within normal limits will improve Outcome: Progressing   Problem: Nutrition: Goal: Adequate nutrition will be maintained Outcome: Progressing   Problem: Pain Managment: Goal: General experience of comfort will improve and/or be controlled Outcome: Progressing

## 2024-10-03 NOTE — Progress Notes (Signed)
 Physical Therapy Treatment Patient Details Name: James Lambert MRN: [id number] DOB: 1965-12-11 Today's Date: 10/03/2024   History of Present Illness Pt is a 58 y/o M presenting to ED with c/o R knee pain, swelling, redness. US  RLE negative for DVT. MD assessment includes RLE cellulitis, ambulatory dysfunction secondary to cellulitis. PMH significant for alcohol use disorder, HTN, HLD, hx of CVA, COPD.    PT Comments  Patient seen for PT session focused on OOB mobility. Patient required minA to stand at bedside and take few steps forwards and backwards with RW. Tolerated session well with no signs of exertion or distress. Vitals remained stable during activity. Main limiting factors today were R knee and R foot pain. Interventions aimed at improving standing and ambulating tolerance. Continued skilled PT recommended to progress toward functional goals and support discharge readiness.    If plan is discharge home, recommend the following: A lot of help with walking and/or transfers;A lot of help with bathing/dressing/bathroom;Assist for transportation   Can travel by private vehicle     No  Equipment Recommendations  Other (comment) (TBD)    Recommendations for Other Services       Precautions / Restrictions Precautions Precautions: Fall Recall of Precautions/Restrictions: Impaired Restrictions Weight Bearing Restrictions Per Provider Order: No     Mobility  Bed Mobility Overal bed mobility: Needs Assistance Bed Mobility: Rolling, Sidelying to Sit Rolling: Min assist Sidelying to sit: Min assist       General bed mobility comments: pt preformed with slow movement speed and required vc and light physical assist for LE placement    Transfers Overall transfer level: Needs assistance Equipment used: Rolling walker (2 wheels) Transfers: Sit to/from Stand Sit to Stand: Min assist, +2 safety/equipment, From elevated surface, Mod assist          Lateral/Scoot Transfers:  Contact guard assist, From elevated surface General transfer comment: STS from elevated EOB with minAx2. PT able to lateral scoot minA EOB with limited use of RUE due to history of stroke    Ambulation/Gait Ambulation/Gait assistance: Mod assist Gait Distance (Feet): 6 Feet Assistive device: Rolling walker (2 wheels) Gait Pattern/deviations: Trunk flexed, Shuffle, Narrow base of support, Step-to pattern       General Gait Details: able to take few steps forward and few steps backwards at bedside; heavy use of UE to off load RLE due to pain   Stairs             Wheelchair Mobility     Tilt Bed    Modified Rankin (Stroke Patients Only)       Balance Overall balance assessment: Needs assistance Sitting-balance support: Feet supported Sitting balance-Leahy Scale: Good     Standing balance support: Bilateral upper extremity supported, Reliant on assistive device for balance Standing balance-Leahy Scale: Fair Standing balance comment: heavy BUE support in standing, minAx1 to maintain standing; able to stand for ~2 min. to use urinal at bedside                            Communication Communication Communication: No apparent difficulties  Cognition Arousal: Alert Behavior During Therapy: WFL for tasks assessed/performed   PT - Cognitive impairments: No apparent impairments                       PT - Cognition Comments: A&Ox4 Following commands: Intact      Cueing    Exercises  General Comments        Pertinent Vitals/Pain Pain Assessment Pain Assessment: Faces Faces Pain Scale: Hurts even more Pain Location: R knee; R foot Pain Descriptors / Indicators: Grimacing, Sore Pain Intervention(s): Limited activity within patient's tolerance, Monitored during session, Repositioned    Home Living                          Prior Function            PT Goals (current goals can now be found in the care plan section) Acute  Rehab PT Goals Patient Stated Goal: to decrease pain PT Goal Formulation: With patient Time For Goal Achievement: 10/15/24 Potential to Achieve Goals: Fair Progress towards PT goals: Progressing toward goals    Frequency    Min 2X/week      PT Plan      Co-evaluation              AM-PAC PT 6 Clicks Mobility   Outcome Measure  Help needed turning from your back to your side while in a flat bed without using bedrails?: A Little Help needed moving from lying on your back to sitting on the side of a flat bed without using bedrails?: A Little Help needed moving to and from a bed to a chair (including a wheelchair)?: A Lot Help needed standing up from a chair using your arms (e.g., wheelchair or bedside chair)?: A Lot Help needed to walk in hospital room?: Total Help needed climbing 3-5 steps with a railing? : Total 6 Click Score: 12    End of Session Equipment Utilized During Treatment: Gait belt Activity Tolerance: Patient limited by pain Patient left: in bed;with call bell/phone within reach;with bed alarm set Nurse Communication: Mobility status PT Visit Diagnosis: Unsteadiness on feet (R26.81);Muscle weakness (generalized) (M62.81);Difficulty in walking, not elsewhere classified (R26.2);Pain Pain - Right/Left: Right Pain - part of body: Knee;Ankle and joints of foot     Time: 1440-1504 PT Time Calculation (min) (ACUTE ONLY): 24 min  Charges:    $Therapeutic Activity: 8-22 mins PT General Charges $$ ACUTE PT VISIT: 1 Visit                     Sherlean Lesches DPT, PT     Sherlean A Jamisyn Langer 10/03/2024, 3:14 PM

## 2024-10-04 DIAGNOSIS — L03115 Cellulitis of right lower limb: Secondary | ICD-10-CM | POA: Diagnosis not present

## 2024-10-04 MED ORDER — TRAZODONE HCL 50 MG PO TABS
50.0000 mg | ORAL_TABLET | Freq: Every day | ORAL | Status: DC
  Administered 2024-10-04 – 2024-10-06 (×3): 50 mg via ORAL
  Filled 2024-10-04 (×3): qty 1

## 2024-10-04 MED ORDER — HYDROCODONE-ACETAMINOPHEN 5-325 MG PO TABS
1.0000 | ORAL_TABLET | ORAL | Status: DC | PRN
  Administered 2024-10-04 – 2024-10-06 (×6): 2 via ORAL
  Administered 2024-10-06: 1 via ORAL
  Administered 2024-10-07 (×2): 2 via ORAL
  Filled 2024-10-04 (×3): qty 2
  Filled 2024-10-04: qty 1
  Filled 2024-10-04 (×5): qty 2

## 2024-10-04 MED ORDER — MELATONIN 5 MG PO TABS
5.0000 mg | ORAL_TABLET | Freq: Every evening | ORAL | Status: DC | PRN
  Administered 2024-10-04: 5 mg via ORAL
  Filled 2024-10-04: qty 1

## 2024-10-04 MED ORDER — DIAZEPAM 5 MG PO TABS: 5.0000 mg | ORAL_TABLET | Freq: Every evening | ORAL | Status: DC | PRN

## 2024-10-04 MED ORDER — KETOROLAC TROMETHAMINE 15 MG/ML IJ SOLN
15.0000 mg | Freq: Once | INTRAMUSCULAR | Status: AC
  Administered 2024-10-04: 15 mg via INTRAVENOUS
  Filled 2024-10-04: qty 1

## 2024-10-04 NOTE — Plan of Care (Signed)

## 2024-10-04 NOTE — NC FL2 (Signed)
 [place]  MEDICAID FL2 LEVEL OF CARE FORM     IDENTIFICATION  Patient Name: James Lambert Birthdate: 1966/07/03 Sex: male Admission Date (Current Location): 09/30/2024  [facility] and [place] Number:  Chiropodist and Address:  [facility], [address], [place], [place] [address]      Provider Number: [id number]  Attending Physician Name and Address:  Jens Durand, MD  Relative Name and Phone Number:       Current Level of Care: Hospital Recommended Level of Care: Skilled Nursing Facility Prior Approval Number:    Date Approved/Denied:   PASRR Number: [id number]  Discharge Plan: SNF    Current Diagnoses: Patient Active Problem List   Diagnosis Date Noted   Cellulitis 09/30/2024   Coronary atherosclerosis 08/21/2024   TIA (transient ischemic attack) 06/27/2024   Seizure (HCC) 06/27/2024   Complex tear of meniscus of right knee 04/25/2023   Obesity (BMI 30-39.9) 04/24/2023   Elevated hemoglobin A1c measurement 09/11/2022   Hemiparesis affecting right side as late effect of stroke (HCC) 09/10/2022   Bilateral chronic knee pain 09/10/2022   Aortic atherosclerosis 09/10/2022   GERD without esophagitis 08/30/2022   Mixed hyperlipidemia 08/30/2022   Alcohol abuse 08/30/2022   Hypertension    Nicotine  dependence, cigarettes, uncomplicated    Hidradenitis suppurativa    Centrilobular emphysema (HCC)    History of CVA (cerebrovascular accident) 03/08/2022    Orientation RESPIRATION BLADDER Height & Weight     Self, Time, Situation, Place  Normal Continent Weight: 249 lb 1.9 oz (113 kg) Height:  6' 2 (188 cm)  BEHAVIORAL SYMPTOMS/MOOD NEUROLOGICAL BOWEL NUTRITION STATUS      Continent Diet (Heart)  AMBULATORY STATUS COMMUNICATION OF NEEDS Skin   Limited Assist Verbally Normal                       Personal Care Assistance Level of Assistance  Bathing, Feeding, Dressing Bathing Assistance: Limited assistance Feeding  assistance: Independent Dressing Assistance: Limited assistance     Functional Limitations Info  Sight, Hearing, Speech Sight Info: Adequate Hearing Info: Adequate Speech Info: Adequate    SPECIAL CARE FACTORS FREQUENCY  PT (By licensed PT), OT (By licensed OT)     PT Frequency: 5x/week OT Frequency: 5x/week            Contractures      Additional Factors Info  Code Status, Allergies Code Status Info: Full Allergies Info: NKA           Current Medications (10/04/2024):  This is the current hospital active medication list Current Facility-Administered Medications  Medication Dose Route Frequency Provider Last Rate Last Admin   acetaminophen  (TYLENOL ) tablet 650 mg  650 mg Oral Q6H PRN Duncan, Hazel V, MD   650 mg at 10/04/24 1025   Or   acetaminophen  (TYLENOL ) suppository 650 mg  650 mg Rectal Q6H PRN Duncan, Hazel V, MD       albuterol  (PROVENTIL ) (2.5 MG/3ML) 0.083% nebulizer solution 2.5 mg  2.5 mg Nebulization Q2H PRN Duncan, Hazel V, MD       amLODipine  (NORVASC ) tablet 10 mg  10 mg Oral Daily Duncan, Hazel V, MD   10 mg at 10/04/24 0908   atorvastatin  (LIPITOR ) tablet 80 mg  80 mg Oral Daily Duncan, Hazel V, MD   80 mg at 10/04/24 1025   ceFAZolin  (ANCEF ) IVPB 2g/100 mL premix  2 g Intravenous Q8H Pokhrel, Laxman, MD 200 mL/hr at 10/04/24 0919 2 g at  10/04/24 0919   clopidogrel  (PLAVIX ) tablet 75 mg  75 mg Oral Daily Duncan, Hazel V, MD   75 mg at 10/04/24 9092   cyanocobalamin  (VITAMIN B12) tablet 1,000 mcg  1,000 mcg Oral Daily Pokhrel, Laxman, MD   1,000 mcg at 10/04/24 0908   enoxaparin  (LOVENOX ) injection 57.5 mg  0.5 mg/kg Subcutaneous Q24H Duncan, Hazel V, MD   57.5 mg at 10/04/24 0908   folic acid  (FOLVITE ) tablet 1 mg  1 mg Oral Daily Duncan, Hazel V, MD   1 mg at 10/04/24 0908   irbesartan  (AVAPRO ) tablet 300 mg  300 mg Oral Daily Hunt, Madison H, RPH   300 mg at 10/04/24 9092   And   hydrochlorothiazide  (HYDRODIURIL ) tablet 12.5 mg  12.5 mg Oral Daily  Perley Lum DEL, RPH   12.5 mg at 10/04/24 9092   HYDROcodone -acetaminophen  (NORCO/VICODIN) 5-325 MG per tablet 1-2 tablet  1-2 tablet Oral Q4H PRN Duncan, Hazel V, MD   2 tablet at 10/04/24 9092   melatonin tablet 5 mg  5 mg Oral QHS PRN Duncan, Hazel V, MD   5 mg at 10/04/24 0158   metoprolol  succinate (TOPROL -XL) 24 hr tablet 50 mg  50 mg Oral Daily Duncan, Hazel V, MD   50 mg at 10/04/24 0908   multivitamin with minerals tablet 1 tablet  1 tablet Oral Daily Cleatus Delayne GAILS, MD   1 tablet at 10/04/24 9091   nicotine  (NICODERM CQ  - dosed in mg/24 hours) patch 21 mg  21 mg Transdermal Daily Pokhrel, Laxman, MD   21 mg at 10/04/24 9090   ondansetron  (ZOFRAN ) tablet 4 mg  4 mg Oral Q6H PRN Duncan, Hazel V, MD       Or   ondansetron  (ZOFRAN ) injection 4 mg  4 mg Intravenous Q6H PRN Cleatus Delayne GAILS, MD       Oral care mouth rinse  15 mL Mouth Rinse PRN Pokhrel, Laxman, MD       pantoprazole  (PROTONIX ) EC tablet 40 mg  40 mg Oral QHS Duncan, Hazel V, MD   40 mg at 10/03/24 2134   polyethylene glycol (MIRALAX  / GLYCOLAX ) packet 17 g  17 g Oral Daily Jens Durand, MD   17 g at 10/03/24 9182   senna-docusate (Senokot-S) tablet 1 tablet  1 tablet Oral QHS PRN Jens Durand, MD       thiamine  (VITAMIN B1) tablet 100 mg  100 mg Oral Daily Duncan, Hazel V, MD   100 mg at 10/04/24 9091   Or   thiamine  (VITAMIN B1) injection 100 mg  100 mg Intravenous Daily Duncan, Hazel V, MD         Discharge Medications: Please see discharge summary for a list of discharge medications.  Relevant Imaging Results:  Relevant Lab Results:   Additional Information SSN: [id number]  Alvaro Louder, LCSW

## 2024-10-04 NOTE — Consult Note (Signed)
 ORTHOPAEDIC CONSULTATION  REQUESTING PHYSICIAN: Jens Durand, MD  Chief Complaint: Right foot pain  HPI: James Lambert is a 58 y.o. male who complains of right foot pain.  Admitted with recent fall.  Upon admission noted swelling to the right foot.  X-ray showed a fracture of the great toe.  MRI was consistent with acute to subacute fracture of the great toe.  No concern for infection.  Past Medical History:  Diagnosis Date   Alcohol abuse    Anemia    Aortic atherosclerosis    Asthma    COPD (chronic obstructive pulmonary disease) (HCC)    CVA (cerebral vascular accident) (HCC) 05/10/2022   right side weakness   GERD (gastroesophageal reflux disease)    Hemiparesis affecting right side as late effect of stroke (HCC)    Hidradenitis suppurativa    diagnosed in [facility] ED based on history and physical exam   HLD (hyperlipidemia)    Hypertension    Marijuana use, continuous    Seizure cerebral (HCC)    Sleep apnea    does not use cpap   Tobacco use    Past Surgical History:  Procedure Laterality Date   INCISION AND DRAINAGE ABSCESS Bilateral 11/13/2022   Procedure: INCISION AND DRAINAGE ABSCESS bilateral buttocks & left groin;  Surgeon: Desiderio Schanz, MD;  Location: [facility] ORS;  Service: General;  Laterality: Bilateral;   IR CT HEAD LTD  05/10/2022   IR FLUORO GUIDED NEEDLE PLC ASPIRATION/INJECTION LOC  03/20/2022   IR PERCUTANEOUS ART THROMBECTOMY/INFUSION INTRACRANIAL INC DIAG ANGIO  05/10/2022   IR US  GUIDE VASC ACCESS RIGHT  05/10/2022   RADIOLOGY WITH ANESTHESIA N/A 05/10/2022   Procedure: IR WITH ANESTHESIA;  Surgeon: Dolphus Carrion, MD;  Location: [facility] OR;  Service: Radiology;  Laterality: N/A;   TEE WITHOUT CARDIOVERSION N/A 03/11/2022   Procedure: TRANSESOPHAGEAL ECHOCARDIOGRAM (TEE);  Surgeon: Julson Wolm PARAS, MD;  Location: [facility] ORS;  Service: Cardiovascular;  Laterality: N/A;   Social History   Socioeconomic History   Marital status: Single    Spouse name: Not on file    Number of children: Not on file   Years of education: Not on file   Highest education level: Not on file  Occupational History   Not on file  Tobacco Use   Smoking status: Every Day    Current packs/day: 1.25    Average packs/day: 1.3 packs/day for 27.0 years (33.8 ttl pk-yrs)    Types: Cigarettes    Passive exposure: Current   Smokeless tobacco: Never   Tobacco comments:    Smoking 5-6 cigarettes per day  Vaping Use   Vaping status: Never Used  Substance and Sexual Activity   Alcohol use: Yes   Drug use: Yes    Frequency: 14.0 times per week    Types: Marijuana    Comment: 1-2 times per day   Sexual activity: Not Currently  Other Topics Concern   Not on file  Social History Narrative   Not on file   Social Drivers of Health   Financial Resource Strain: High Risk (03/09/2023)   Overall Financial Resource Strain (CARDIA)    Difficulty of Paying Living Expenses: Hard  Food Insecurity: No Food Insecurity (09/30/2024)   Hunger Vital Sign    Worried About Running Out of Food in the Last Year: Never true    Ran Out of Food in the Last Year: Never true  Transportation Needs: No Transportation Needs (09/30/2024)   PRAPARE - Transportation    Lack of  Transportation (Medical): No    Lack of Transportation (Non-Medical): No  Physical Activity: Insufficiently Active (09/10/2022)   Exercise Vital Sign    Days of Exercise per Week: 3 days    Minutes of Exercise per Session: 30 min  Stress: Stress Concern Present (08/18/2023)   [employer] of Occupational Health - Occupational Stress Questionnaire    Feeling of Stress : To some extent  Social Connections: Moderately Isolated (09/30/2024)   Social Connection and Isolation Panel    Frequency of Communication with Friends and Family: Three times a week    Frequency of Social Gatherings with Friends and Family: Three times a week    Attends Religious Services: 1 to 4 times per year    Active Member of Clubs or Organizations: No     Attends Banker Meetings: Never    Marital Status: Separated   Family History  Problem Relation Age of Onset   Diabetes Mother    Alzheimer's disease Mother    Hypertension Father    Bone cancer Father    Diabetes Brother    Hypertension Brother    Other Maternal Grandmother        unknown medical history   Other Maternal Grandfather        unknown medical history   Other Paternal Grandmother        unknown medical history   Other Paternal Grandfather        unknown medical history   No Known Allergies Prior to Admission medications   Medication Sig Start Date End Date Taking? Authorizing Provider  acetaminophen  (TYLENOL ) 500 MG tablet Take 2 tablets (1,000 mg total) by mouth every 6 (six) hours as needed. 05/29/24 05/29/25 Yes Clarine Ozell LABOR, MD  albuterol  (VENTOLIN  HFA) 108 (90 Base) MCG/ACT inhaler Inhale 1-2 puffs into the lungs every 6 (six) hours as needed for wheezing or shortness of breath. 03/17/24  Yes Cannady, Jolene T, NP  amLODipine  (NORVASC ) 10 MG tablet Take 1 tablet (10 mg total) by mouth daily. 07/12/24  Yes Cannady, Jolene T, NP  atorvastatin  (LIPITOR ) 80 MG tablet Take 1 tablet (80 mg total) by mouth daily. 07/12/24  Yes Cannady, Jolene T, NP  clopidogrel  (PLAVIX ) 75 MG tablet Take 1 tablet (75 mg total) by mouth daily. 07/12/24  Yes Cannady, Jolene T, NP  cyanocobalamin  1000 MCG tablet Take 1 tablet (1,000 mcg total) by mouth daily. 06/30/24  Yes Alexander, Natalie, DO  metoprolol  succinate (TOPROL -XL) 50 MG 24 hr tablet Take 1 tablet (50 mg total) by mouth daily with or immediately following a meal. 07/12/24  Yes Cannady, Jolene T, NP  mupirocin  ointment (BACTROBAN ) 2 % Apply 1 Application topically 2 (two) times daily. 03/17/24  Yes Cannady, Jolene T, NP  olmesartan -hydrochlorothiazide  (BENICAR  HCT) 40-12.5 MG tablet Take 1 tablet by mouth daily. 03/17/24  Yes Cannady, Jolene T, NP  pantoprazole  (PROTONIX ) 40 MG tablet Take 1 tablet (40 mg total) by mouth at  bedtime. 07/12/24  Yes Cannady, Jolene T, NP  thiamine  (VITAMIN B-1) 100 MG tablet Take 1 tablet (100 mg total) by mouth daily. 07/12/24  Yes Cannady, Jolene T, NP  tiZANidine  (ZANAFLEX ) 2 MG tablet Take 1 tablet (2 mg total) by mouth at bedtime. May also take 1 tablet (2 mg total) daily as needed for muscle spasms. 06/29/24  Yes Alexander, Natalie, DO  umeclidinium-vilanterol (ANORO ELLIPTA ) 62.5-25 MCG/ACT AEPB Inhale 1 puff into the lungs daily at 6 (six) AM. 03/17/24  Yes Cannady, Jolene T, NP  meloxicam  (  MOBIC ) 15 MG tablet Take 1 tablet (15 mg total) by mouth daily. 09/27/24 10/27/24  Margrette, Myah A, PA-C  Multiple Vitamin (MULTIVITAMIN WITH MINERALS) TABS tablet Take 1 tablet by mouth daily. Patient not taking: Reported on 09/30/2024 05/27/22   Rosendo Nena PARAS, PA-C   DG Toe Great Right Result Date: 10/03/2024 CLINICAL DATA:  Fracture of right great toe. EXAM: RIGHT GREAT TOE COMPARISON:  Foot MRI yesterday FINDINGS: Fracture of the great toe proximal phalanx is likely subacute with some peripheral callus formation. No convincing intra-articular involvement on the current exam. 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 IMPRESSION: Fracture of the great toe proximal phalanx is likely subacute with some peripheral callus formation. No convincing intra-articular involvement on the current exam. Electronically Signed   By: Andrea Gasman M.D.   On: 10/03/2024 15:38   MR FOOT RIGHT W WO CONTRAST Result Date: 10/03/2024 CLINICAL DATA:  Soft tissue infection suspected, foot, xray done Right leg and foot redness, swelling and pain. Soft tissue infection suspected. EXAM: MRI OF THE RIGHT FOREFOOT WITHOUT AND WITH CONTRAST TECHNIQUE: Multiplanar, multisequence MR imaging of the right foot was performed before and after the administration of intravenous contrast. CONTRAST:  10mL GADAVIST  GADOBUTROL  1 MMOL/ML IV SOLN COMPARISON:  Lower extremity venous Doppler ultrasound  09/30/2024. No other recent relevant imaging available. FINDINGS: Technical note: Despite efforts by the technologist and patient, mild to moderate motion artifact is present on today's exam and could not be eliminated. This reduces exam sensitivity and specificity. Some sequences were repeated. Bones/Joint/Cartilage Examination was performed with a large field of view which includes most of the foot. Best seen on the sagittal images is a probable fracture involving the proximal phalanx of the great toe (image 24/17). This may demonstrate proximal intra-articular extension. There is no other evidence of acute fracture, dislocation or osteomyelitis. There are moderate degenerative changes at the 1st metatarsophalangeal joint. No other significant arthropathic changes are identified. No significant joint effusions or abnormal synovial enhancement. Ligaments Intact Lisfranc ligament. The collateral ligaments of the metatarsophalangeal joints are intact. Muscles and Tendons No focal muscular abnormalities or abnormal enhancement identified. The ankle tendons appear intact without tenosynovitis. The forefoot tendons appear intact. Soft tissues No obvious focal soft tissue ulceration identified. There is moderate subcutaneous edema throughout the forefoot without organized fluid collection or abnormal enhancement. No foreign bodies are identified. IMPRESSION: 1. Probable acute/subacute fracture of the proximal phalanx of the great toe with possible intra-articular extension. This is suboptimally evaluated due to motion. Recommend plain film correlation. 2. No evidence of osteomyelitis or septic arthritis. 3. Nonspecific subcutaneous edema throughout the forefoot without organized fluid collection or abnormal enhancement. 4. Moderate degenerative changes at the 1st metatarsophalangeal joint. Electronically Signed   By: Elsie Perone M.D.   On: 10/03/2024 10:28    Positive ROS: All other systems have been reviewed and  were otherwise negative with the exception of those mentioned in the HPI and as above.  12 point ROS was performed.  Physical Exam: General: Alert and oriented.  No apparent distress.  Vascular:  Left foot:Dorsalis Pedis:  diminished Posterior Tibial:  diminished  Right foot: Dorsalis Pedis:  diminished Posterior Tibial:  diminished  Neuro:intact gross sensation  Derm: Right foot without open wound.  Mild darkened discoloration associated with inflammation.  Ortho/MS: Diffuse edema to the right foot.  Guarded range of motion of the great toe.  He is guarded with any range of motion of even the right lower extremity.  He  is complaining of a lot of right knee pain.  I personally reviewed both the x-ray and MRI that shows a fracture of the midshaft of the proximal phalanx of the great toe.  Assessment: Right great toe fracture  Plan: I recommended postop shoe for ambulation.  He is allowed to ambulate but should have the shoe on at all times to keep his great toe protected.  There was minimal displacement.  No need for surgical intervention or reduction at this time.  We discussed with time this fracture site will heal without concern.  He was complaining of worsening guarding pain to his knee.  I have recommended follow-up with orthopedics in the future as needed.    Ashley Eva LABOR, DPM Cell [phone]   10/04/2024 4:24 PM

## 2024-10-04 NOTE — Plan of Care (Addendum)
  Patient is alert and oriented x4 with episodes of confusion. Clear lung sounds, on room air no noted cough. Abdomen soft, last bowel movement 10/25, +bowel sounds. Voiding without any difficulty. +CMS, able to feel sensation, patient able to wiggle toes,+dorsi/plantar flex, denies numbness or tingling, +2 edema noted to right foot. Tolerating diet. IV antibiotics infusing per order. Open boils draining malodorous purulent fluid from buttocks. Hourly rounding performed, fall precautions maintained, and call bell within reach. Patient educated on having the bed in lowest position and fall precautions and refused to have the bed in the lowest position and also refused to wear nonskid socks. Patient asked for something to help him sleep MD ordered PRN melatonin and still awake when this RN asked if he would like to paged the MD again for something else patient refused. Also CNA and RN attempted to clean patients room and patient told both RN and CNA to leave his stuff alone.     Problem: Education: Goal: Knowledge of General Education information will improve Description: Including pain rating scale, medication(s)/side effects and non-pharmacologic comfort measures Outcome: Progressing   Problem: Health Behavior/Discharge Planning: Goal: Ability to manage health-related needs will improve Outcome: Progressing   Problem: Clinical Measurements: Goal: Ability to maintain clinical measurements within normal limits will improve Outcome: Progressing Goal: Will remain free from infection Outcome: Progressing Goal: Diagnostic test results will improve Outcome: Progressing Goal: Respiratory complications will improve Outcome: Progressing Goal: Cardiovascular complication will be avoided Outcome: Progressing   Problem: Activity: Goal: Risk for activity intolerance will decrease Outcome: Progressing   Problem: Nutrition: Goal: Adequate nutrition will be maintained Outcome: Progressing   Problem:  Coping: Goal: Level of anxiety will decrease Outcome: Progressing   Problem: Elimination: Goal: Will not experience complications related to bowel motility Outcome: Progressing Goal: Will not experience complications related to urinary retention Outcome: Progressing   Problem: Pain Managment: Goal: General experience of comfort will improve and/or be controlled Outcome: Progressing   Problem: Safety: Goal: Ability to remain free from injury will improve Outcome: Progressing   Problem: Skin Integrity: Goal: Risk for impaired skin integrity will decrease Outcome: Progressing   Problem: Clinical Measurements: Goal: Ability to avoid or minimize complications of infection will improve Outcome: Progressing   Problem: Skin Integrity: Goal: Skin integrity will improve Outcome: Progressing

## 2024-10-04 NOTE — Progress Notes (Addendum)
 Progress Note    [name]  FMW:[id number] DOB: 12/19/1965  DOA: 09/30/2024 PCP: [name], NP      Brief Narrative:    Medical records reviewed and are as summarized below:  James Lambert is a 58 y.o. male with past medical history of alcohol use disorder, hypertension, hyperlipidemia and history of CVA and COPD, who presented to the hospital with redness, swelling and pain of the right leg and right foot.  There was also some redness to the right foot.  Symptoms have progressively worsened and he had difficulty ambulating because of this.  Vitals in the ED: Temperature 98.2 F, respiratory 20, pulse 91, BP 154/82, O2 sat 100% on room air.  Labs: WBC 10.4, hemoglobin 11.0, BUN 34, creatinine 1.28, lactic acid 1.6.  Venous duplex of the right lower extremity was negative for DVT  He was admitted to the hospital for right lower extremity cellulitis.   Assessment/Plan:   Principal Problem:   Cellulitis Active Problems:   History of CVA (cerebrovascular accident)   Hypertension   Mixed hyperlipidemia   Body mass index is 31.99 kg/m.  (Class I obesity)   Right lower extremity cellulitis (leg and foot): MRI of the foot did not show any evidence of osteomyelitis, septic arthritis or abscess.   X-ray of the right great toe showed fracture of the proximal phalanx of great toe, likely subacute with some peripheral callus formation. Follow-up with Dr. Ashley, podiatrist, for further recommendations.   Hypertension: Continue Lidopin, metoprolol , irbesartan , HCTZ   Alcohol use disorder: Ativan  as needed per CIWA protocol.  Continue thiamine  and folate supplement.  He said his last drink was about 3 days prior to admission.   General weakness, ambulatory dysfunction, s/p mechanical fall at home (about 5 days prior to admission): PT recommended discharge to SNF.   COPD: Stable.  Continue bronchodilators.   History of stroke: Continue Plavix  and  Lipitor    Suspect patient also has peripheral neuropathy. He also has right knee osteoarthritis which he describes as bone-on-bone   Diet Order             Diet Heart Room service appropriate? Yes; Fluid consistency: Thin  Diet effective now                                  Consultants: Podiatrist  Procedures: None    Medications:    amLODipine   10 mg Oral Daily   atorvastatin   80 mg Oral Daily   clopidogrel   75 mg Oral Daily   cyanocobalamin   1,000 mcg Oral Daily   enoxaparin  (LOVENOX ) injection  0.5 mg/kg Subcutaneous Q24H   folic acid   1 mg Oral Daily   irbesartan   300 mg Oral Daily   And   hydrochlorothiazide   12.5 mg Oral Daily   metoprolol  succinate  50 mg Oral Daily   multivitamin with minerals  1 tablet Oral Daily   nicotine   21 mg Transdermal Daily   pantoprazole   40 mg Oral QHS   polyethylene glycol  17 g Oral Daily   thiamine   100 mg Oral Daily   Or   thiamine   100 mg Intravenous Daily   Continuous Infusions:   ceFAZolin  (ANCEF ) IV 2 g (10/04/24 0919)     Anti-infectives (From admission, onward)    Start     Dose/Rate Route Frequency Ordered Stop   10/01/24 0200  cefTRIAXone  (ROCEPHIN ) 1  g in sodium chloride  0.9 % 100 mL IVPB  Status:  Discontinued        1 g 200 mL/hr over 30 Minutes Intravenous Every 24 hours 09/30/24 0326 09/30/24 0819   09/30/24 0900  ceFAZolin  (ANCEF ) IVPB 2g/100 mL premix        2 g 200 mL/hr over 30 Minutes Intravenous Every 8 hours 09/30/24 0810 10/07/24 0859   09/30/24 0615  vancomycin  (VANCOCIN ) IVPB 1000 mg/200 mL premix  Status:  Discontinued       Placed in Followed by Linked Group   1,000 mg 200 mL/hr over 60 Minutes Intravenous  Once 09/30/24 0529 10/01/24 0859   09/30/24 0615  vancomycin  (VANCOREADY) IVPB 1500 mg/300 mL  Status:  Discontinued       Placed in Followed by Linked Group   1,500 mg 150 mL/hr over 120 Minutes Intravenous  Once 09/30/24 0529 10/01/24 0859   09/30/24 0130   cefTRIAXone  (ROCEPHIN ) 2 g in sodium chloride  0.9 % 100 mL IVPB        2 g 200 mL/hr over 30 Minutes Intravenous  Once 09/30/24 0116 09/30/24 0228   09/30/24 0130  vancomycin  (VANCOCIN ) IVPB 1000 mg/200 mL premix  Status:  Discontinued       Placed in Followed by Linked Group   1,000 mg 200 mL/hr over 60 Minutes Intravenous  Once 09/30/24 0128 09/30/24 0530   09/30/24 0130  vancomycin  (VANCOREADY) IVPB 1500 mg/300 mL  Status:  Discontinued       Placed in Followed by Linked Group   1,500 mg 150 mL/hr over 120 Minutes Intravenous  Once 09/30/24 0128 09/30/24 0530              Family Communication/Anticipated D/C date and plan/Code Status   DVT prophylaxis:      Code Status: Full Code  Family Communication: None Disposition Plan: Plan to discharge to SNF   Status is: Inpatient Remains inpatient appropriate because: Right lower extremity cellulitis        Subjective:   Interval events noted.  He complains of persistent pain and swelling of the right foot.  He also reported that he has been having intermittent numbness in bilateral feet for some time now.  Objective:    Vitals:   10/03/24 1508 10/03/24 2033 10/04/24 0450 10/04/24 0820  BP: 124/81 112/70 116/66 133/89  Pulse: 73 70 67 66  Resp: 19 18 19 16   Temp: (!) 97.5 F (36.4 C) 98 F (36.7 C) 97.7 F (36.5 C) 98.9 F (37.2 C)  TempSrc: Oral   Oral  SpO2: 100% 98% 99% 99%  Weight:      Height:       No data found.   Intake/Output Summary (Last 24 hours) at 10/04/2024 1230 Last data filed at 10/04/2024 1050 Gross per 24 hour  Intake 1440 ml  Output 3150 ml  Net -1710 ml   Filed Weights   09/29/24 2352 09/30/24 0001  Weight: 113.4 kg 113 kg    Exam:  GEN: NAD SKIN: Warm and dry EYES: No pallor or icterus ENT: MMM CV: RRR PULM: CTA B ABD: soft, ND, NT, +BS CNS: AAO x 3, non focal EXT: Right foot swelling and tenderness.  Patient noted to have some increased sensitivity on  bilateral feet.        Data Reviewed:   I have personally reviewed following labs and imaging studies:  Labs: Labs show the following:   Basic Metabolic Panel: Recent Labs  Lab 09/30/24 0141  10/01/24 1250  NA 135 135  K 4.0 4.4  CL 100 104  CO2 23 25  GLUCOSE 151* 155*  BUN 34* 22*  CREATININE 1.28* 1.20  CALCIUM  8.7* 8.3*  MG  --  2.0  PHOS  --  3.2   GFR Estimated Creatinine Clearance: 90.8 mL/min (by C-G formula based on SCr of 1.2 mg/dL). Liver Function Tests: Recent Labs  Lab 09/30/24 0141 10/01/24 1250  AST 21  --   ALT 13  --   ALKPHOS 63  --   BILITOT 0.3  --   PROT 8.2*  --   ALBUMIN 3.5 3.0*   No results for input(s): LIPASE, AMYLASE in the last 168 hours. No results for input(s): AMMONIA in the last 168 hours. Coagulation profile No results for input(s): INR, PROTIME in the last 168 hours.  CBC: Recent Labs  Lab 09/30/24 0141  WBC 10.4  NEUTROABS 7.5  HGB 11.0*  HCT 34.1*  MCV 94.5  PLT 383   Cardiac Enzymes: No results for input(s): CKTOTAL, CKMB, CKMBINDEX, TROPONINI in the last 168 hours. BNP (last 3 results) No results for input(s): PROBNP in the last 8760 hours. CBG: No results for input(s): GLUCAP in the last 168 hours. D-Dimer: No results for input(s): DDIMER in the last 72 hours. Hgb A1c: No results for input(s): HGBA1C in the last 72 hours. Lipid Profile: No results for input(s): CHOL, HDL, LDLCALC, TRIG, CHOLHDL, LDLDIRECT in the last 72 hours. Thyroid function studies: No results for input(s): TSH, T4TOTAL, T3FREE, THYROIDAB in the last 72 hours.  Invalid input(s): FREET3 Anemia work up: No results for input(s): VITAMINB12, FOLATE, FERRITIN, TIBC, IRON, RETICCTPCT in the last 72 hours. Sepsis Labs: Recent Labs  Lab 09/30/24 0141  WBC 10.4  LATICACIDVEN 1.6    Microbiology Recent Results (from the past 240 hours)  Blood culture (routine x 2)      Status: None (Preliminary result)   Collection Time: 09/30/24  1:41 AM   Specimen: BLOOD  Result Value Ref Range Status   Specimen Description BLOOD BLOOD LEFT ARM  Final   Special Requests   Final    BOTTLES DRAWN AEROBIC AND ANAEROBIC Blood Culture adequate volume   Culture   Final    NO GROWTH 4 DAYS Performed at [facility], [address] [place], [place] [address]    Report Status PENDING  Incomplete  Blood culture (routine x 2)     Status: None (Preliminary result)   Collection Time: 09/30/24  2:31 AM   Specimen: BLOOD  Result Value Ref Range Status   Specimen Description BLOOD BLOOD RIGHT ARM  Final   Special Requests   Final    BOTTLES DRAWN AEROBIC AND ANAEROBIC Blood Culture results may not be optimal due to an inadequate volume of blood received in culture bottles   Culture   Final    NO GROWTH 4 DAYS Performed at [facility], [address] [place], [place] [address]    Report Status PENDING  Incomplete  MRSA Next Gen by PCR, Nasal     Status: None   Collection Time: 10/01/24 11:16 AM   Specimen: Nasal Mucosa; Nasal Swab  Result Value Ref Range Status   MRSA by PCR Next Gen NOT DETECTED NOT DETECTED Final    Comment: (NOTE) The GeneXpert MRSA Assay (FDA approved for NASAL specimens only), is one component of a comprehensive MRSA colonization surveillance program. It is not intended to diagnose MRSA infection nor to guide or  monitor treatment for MRSA infections. Test performance is not FDA approved in patients less than 24 years old. Performed at [facility], [address] [place], [place] [address]     Procedures and diagnostic studies:  DG Toe Great Right Result Date: 10/03/2024 CLINICAL DATA:  Fracture of right great toe. EXAM: RIGHT GREAT TOE COMPARISON:  Foot MRI yesterday FINDINGS: Fracture of the great toe proximal phalanx is likely subacute with some peripheral callus formation. No convincing intra-articular  involvement on the current exam. 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 IMPRESSION: Fracture of the great toe proximal phalanx is likely subacute with some peripheral callus formation. No convincing intra-articular involvement on the current exam. Electronically Signed   By: Andrea Gasman M.D.   On: 10/03/2024 15:38   MR FOOT RIGHT W WO CONTRAST Result Date: 10/03/2024 CLINICAL DATA:  Soft tissue infection suspected, foot, xray done Right leg and foot redness, swelling and pain. Soft tissue infection suspected. EXAM: MRI OF THE RIGHT FOREFOOT WITHOUT AND WITH CONTRAST TECHNIQUE: Multiplanar, multisequence MR imaging of the right foot was performed before and after the administration of intravenous contrast. CONTRAST:  10mL GADAVIST  GADOBUTROL  1 MMOL/ML IV SOLN COMPARISON:  Lower extremity venous Doppler ultrasound 09/30/2024. No other recent relevant imaging available. FINDINGS: Technical note: Despite efforts by the technologist and patient, mild to moderate motion artifact is present on today's exam and could not be eliminated. This reduces exam sensitivity and specificity. Some sequences were repeated. Bones/Joint/Cartilage Examination was performed with a large field of view which includes most of the foot. Best seen on the sagittal images is a probable fracture involving the proximal phalanx of the great toe (image 24/17). This may demonstrate proximal intra-articular extension. There is no other evidence of acute fracture, dislocation or osteomyelitis. There are moderate degenerative changes at the 1st metatarsophalangeal joint. No other significant arthropathic changes are identified. No significant joint effusions or abnormal synovial enhancement. Ligaments Intact Lisfranc ligament. The collateral ligaments of the metatarsophalangeal joints are intact. Muscles and Tendons No focal muscular abnormalities or abnormal enhancement identified. The ankle tendons appear  intact without tenosynovitis. The forefoot tendons appear intact. Soft tissues No obvious focal soft tissue ulceration identified. There is moderate subcutaneous edema throughout the forefoot without organized fluid collection or abnormal enhancement. No foreign bodies are identified. IMPRESSION: 1. Probable acute/subacute fracture of the proximal phalanx of the great toe with possible intra-articular extension. This is suboptimally evaluated due to motion. Recommend plain film correlation. 2. No evidence of osteomyelitis or septic arthritis. 3. Nonspecific subcutaneous edema throughout the forefoot without organized fluid collection or abnormal enhancement. 4. Moderate degenerative changes at the 1st metatarsophalangeal joint. Electronically Signed   By: Elsie Perone M.D.   On: 10/03/2024 10:28                LOS: 2 days   Gaylyn Berish  [facility]   Pager on [email or web address]. If 7PM-7AM, please contact night-coverage at [email or web address]     10/04/2024, 12:30 PM

## 2024-10-04 NOTE — TOC Initial Note (Signed)
 Transition of Care [facility]) - Initial/Assessment Note    Patient Details  Name: James Lambert MRN: [id number] Date of Birth: 1966/05/13  Transition of [facility]) CM/SW Contact:    Alvaro Louder, LCSW Phone Number: 10/04/2024, 11:58 AM  Clinical Narrative:                  Per Chart review patient from Home. PCP is [facility] Faxed out information to SNF's in [place]. LCSWA will present Facilities to patient at the bedside.  TOC to follow for discharge        Patient Goals and CMS Choice            Expected Discharge Plan and Services                                              Prior Living Arrangements/Services                       Activities of Daily Living   ADL Screening (condition at time of admission) Independently performs ADLs?: Yes (appropriate for developmental age) Is the patient deaf or have difficulty hearing?: No Does the patient have difficulty seeing, even when wearing glasses/contacts?: No Does the patient have difficulty concentrating, remembering, or making decisions?: No  Permission Sought/Granted                  Emotional Assessment              Admission diagnosis:  Cellulitis [L03.90] Cellulitis of right lower extremity [L03.115] Patient Active Problem List   Diagnosis Date Noted   Cellulitis 09/30/2024   Coronary atherosclerosis 08/21/2024   TIA (transient ischemic attack) 06/27/2024   Seizure (HCC) 06/27/2024   Complex tear of meniscus of right knee 04/25/2023   Obesity (BMI 30-39.9) 04/24/2023   Elevated hemoglobin A1c measurement 09/11/2022   Hemiparesis affecting right side as late effect of stroke (HCC) 09/10/2022   Bilateral chronic knee pain 09/10/2022   Aortic atherosclerosis 09/10/2022   GERD without esophagitis 08/30/2022   Mixed hyperlipidemia 08/30/2022   Alcohol abuse 08/30/2022   Hypertension    Nicotine  dependence, cigarettes, uncomplicated    Hidradenitis  suppurativa    Centrilobular emphysema (HCC)    History of CVA (cerebrovascular accident) 03/08/2022   PCP:  [name], NP Pharmacy:   [facility] [address]), [place] - [address] [place]) [place] [address] Phone: [phone] Fax: [phone]  [facility] - [place], [place] - [address] [place] [address] Phone: [phone] Fax: [phone]     Social Drivers of Health (SDOH) Social History: SDOH Screenings   Food Insecurity: No Food Insecurity (09/30/2024)  Housing: Low Risk  (09/30/2024)  Transportation Needs: No Transportation Needs (09/30/2024)  Utilities: Not At Risk (09/30/2024)  Alcohol Screen: Low Risk  (09/10/2022)  Depression (PHQ2-9): Low Risk  (07/12/2024)  Financial Resource Strain: High Risk (03/09/2023)  Physical Activity: Insufficiently Active (09/10/2022)  Social Connections: Moderately Isolated (09/30/2024)  Stress: Stress Concern Present (08/18/2023)  Tobacco Use: High Risk (09/30/2024)   SDOH Interventions:     Readmission Risk Interventions     No data to display

## 2024-10-05 DIAGNOSIS — L03115 Cellulitis of right lower limb: Secondary | ICD-10-CM | POA: Diagnosis not present

## 2024-10-05 LAB — CBC
HCT: 31 % — ABNORMAL LOW (ref 39.0–52.0)
Hemoglobin: 10.2 g/dL — ABNORMAL LOW (ref 13.0–17.0)
MCH: 30.7 pg (ref 26.0–34.0)
MCHC: 32.9 g/dL (ref 30.0–36.0)
MCV: 93.4 fL (ref 80.0–100.0)
Platelets: 367 K/uL (ref 150–400)
RBC: 3.32 MIL/uL — ABNORMAL LOW (ref 4.22–5.81)
RDW: 13.7 % (ref 11.5–15.5)
WBC: 5.5 K/uL (ref 4.0–10.5)
nRBC: 0 % (ref 0.0–0.2)

## 2024-10-05 LAB — CULTURE, BLOOD (ROUTINE X 2)
Culture: NO GROWTH
Culture: NO GROWTH
Special Requests: ADEQUATE

## 2024-10-05 LAB — BASIC METABOLIC PANEL WITH GFR
Anion gap: 10 (ref 5–15)
BUN: 20 mg/dL (ref 6–20)
CO2: 28 mmol/L (ref 22–32)
Calcium: 8.4 mg/dL — ABNORMAL LOW (ref 8.9–10.3)
Chloride: 97 mmol/L — ABNORMAL LOW (ref 98–111)
Creatinine, Ser: 1.13 mg/dL (ref 0.61–1.24)
GFR, Estimated: >60 mL/min (ref >60)
Glucose, Bld: 148 mg/dL — ABNORMAL HIGH (ref 70–99)
Potassium: 4.2 mmol/L (ref 3.5–5.1)
Sodium: 135 mmol/L (ref 135–145)

## 2024-10-05 LAB — BRAIN NATRIURETIC PEPTIDE: B Natriuretic Peptide: 91.9 pg/mL (ref 0.0–100.0)

## 2024-10-05 NOTE — TOC Progression Note (Addendum)
 Transition of Care [facility]) - Progression Note    Patient Details  Name: James Lambert MRN: [id number] Date of Birth: 19-Feb-1966  [facility]) CM/SW Contact  Alvaro Louder, [place] Phone Number: 10/05/2024, 1:49 PM  Clinical Narrative:    3:31 PM: [facility] can take patient and has initiated insurance auth for him.      [facility]  is reviewing the patient financially before they offer a bed. LCSWA reported to the patient that if [facility] declines he will have to participate in OP Rehab at [facility]. Patient indicated understanding and agreed. Patient had concerns about transportation. LCSWA educated about Medicaid transportation (MTM).   TOC to follow for discharge                      Expected Discharge Plan and Services                                               Social Drivers of Health (SDOH) Interventions SDOH Screenings   Food Insecurity: No Food Insecurity (09/30/2024)  Housing: Low Risk  (09/30/2024)  Transportation Needs: No Transportation Needs (09/30/2024)  Utilities: Not At Risk (09/30/2024)  Alcohol Screen: Low Risk  (09/10/2022)  Depression (PHQ2-9): Low Risk  (07/12/2024)  Financial Resource Strain: High Risk (03/09/2023)  Physical Activity: Insufficiently Active (09/10/2022)  Social Connections: Moderately Isolated (09/30/2024)  Stress: Stress Concern Present (08/18/2023)  Tobacco Use: High Risk (09/30/2024)    Readmission Risk Interventions     No data to display

## 2024-10-05 NOTE — Plan of Care (Signed)
  Problem: Education: Goal: Knowledge of General Education information will improve Description: Including pain rating scale, medication(s)/side effects and non-pharmacologic comfort measures Outcome: Progressing   Problem: Clinical Measurements: Goal: Ability to maintain clinical measurements within normal limits will improve Outcome: Progressing Goal: Will remain Leela Vanbrocklin from infection Outcome: Progressing Goal: Diagnostic test results will improve Outcome: Progressing Goal: Respiratory complications will improve Outcome: Progressing Goal: Cardiovascular complication will be avoided Outcome: Progressing   Problem: Nutrition: Goal: Adequate nutrition will be maintained Outcome: Progressing   Problem: Coping: Goal: Level of anxiety will decrease Outcome: Progressing   Problem: Elimination: Goal: Will not experience complications related to bowel motility Outcome: Progressing Goal: Will not experience complications related to urinary retention Outcome: Progressing   Problem: Safety: Goal: Ability to remain Elisabet Gutzmer from injury will improve Outcome: Progressing   Problem: Skin Integrity: Goal: Risk for impaired skin integrity will decrease Outcome: Progressing   Problem: Clinical Measurements: Goal: Ability to avoid or minimize complications of infection will improve Outcome: Progressing   Problem: Skin Integrity: Goal: Skin integrity will improve Outcome: Progressing   Problem: Health Behavior/Discharge Planning: Goal: Ability to manage health-related needs will improve Outcome: Not Progressing   Problem: Activity: Goal: Risk for activity intolerance will decrease Outcome: Not Progressing   Problem: Pain Managment: Goal: General experience of comfort will improve and/or be controlled Outcome: Not Progressing

## 2024-10-05 NOTE — Plan of Care (Signed)

## 2024-10-05 NOTE — Progress Notes (Signed)
 Mobility Specialist Progress Note:    10/05/24 1655  Mobility  Activity Refused and notified nurse if applicable   Pt sitting EOB upon arrival, politely refused mobility. Pt stated they just ambulated. All needs met.  Sherrilee Ditty Mobility Specialist Please contact via Special Educational Needs Teacher or  Rehab office at [phone]

## 2024-10-05 NOTE — Progress Notes (Signed)
 Physical Therapy Treatment Patient Details Name: James Lambert MRN: [id number] DOB: 04/08/1966 Today's Date: 10/05/2024   History of Present Illness Pt is a 58 y/o M presenting to ED with c/o R knee pain, swelling, redness. US  RLE negative for DVT. MD assessment includes RLE cellulitis, ambulatory dysfunction secondary to cellulitis. PMH significant for alcohol use disorder, HTN, HLD, hx of CVA, COPD.    PT Comments  Patient seen for PT session focused on OOB mobility. Patient required modA progressing to minA with RW for 25 feet ambulation in room. Tolerated session well with no signs of exertion or distress. Vitals remained stable during activity. Main limiting factors today were tolerance for weight acceptance on RLE due to pain. Interventions aimed at improving ambulation. Continued skilled PT recommended to progress toward functional goals and support discharge readiness.    If plan is discharge home, recommend the following: A lot of help with walking and/or transfers;A lot of help with bathing/dressing/bathroom;Assist for transportation   Can travel by private vehicle     Yes  Equipment Recommendations  Rolling walker (2 wheels)    Recommendations for Other Services       Precautions / Restrictions Precautions Precautions: Fall Recall of Precautions/Restrictions: Impaired Restrictions Weight Bearing Restrictions Per Provider Order: No     Mobility  Bed Mobility Overal bed mobility: Needs Assistance Bed Mobility: Rolling, Sidelying to Sit Rolling: Supervision Sidelying to sit: Supervision Supine to sit: Supervision Sit to supine: Supervision        Transfers Overall transfer level: Needs assistance Equipment used: Rolling walker (2 wheels) Transfers: Sit to/from Stand Sit to Stand: Min assist, From elevated surface           General transfer comment: STS from elevated EOB with minAx2. PT able to lateral scoot minA EOB with limited use of RUE due to history  of stroke    Ambulation/Gait Ambulation/Gait assistance: Mod assist Gait Distance (Feet): 25 Feet Assistive device: Rolling walker (2 wheels) Gait Pattern/deviations: Trunk flexed, Shuffle, Narrow base of support, Step-to pattern       General Gait Details: flexed trunk with heavy use of UE to off load RLE during weight acceptance phase of gait due to pain   Stairs             Wheelchair Mobility     Tilt Bed    Modified Rankin (Stroke Patients Only)       Balance Overall balance assessment: Needs assistance Sitting-balance support: Feet supported Sitting balance-James Lambert Scale: Good Sitting balance - Comments: steady static and dynamic sitting, able to reach within BOS   Standing balance support: Bilateral upper extremity supported, Reliant on assistive device for balance Standing balance-James Lambert Scale: Fair Standing balance comment: heavy BUE support in standing, minAx1 to maintain standing                            Communication    Cognition                                        Cueing    Exercises      General Comments        Pertinent Vitals/Pain Pain Assessment Faces Pain Scale: Hurts even more Breathing: normal Negative Vocalization: none Facial Expression: smiling or inexpressive Body Language: relaxed Consolability: no need to console PAINAD Score: 0 Pain Location: R knee; R  foot Pain Descriptors / Indicators: Grimacing, Sore Pain Intervention(s): Limited activity within patient's tolerance, Monitored during session, Repositioned    Home Living                          Prior Function            PT Goals (current goals can now be found in the care plan section) Acute Rehab PT Goals Patient Stated Goal: to decrease pain PT Goal Formulation: With patient Time For Goal Achievement: 10/15/24 Progress towards PT goals: Progressing toward goals    Frequency    Min 2X/week      PT Plan       Co-evaluation              AM-PAC PT 6 Clicks Mobility   Outcome Measure  Help needed turning from your back to your side while in a flat bed without using bedrails?: A Little Help needed moving from lying on your back to sitting on the side of a flat bed without using bedrails?: A Little Help needed moving to and from a bed to a chair (including a wheelchair)?: A Little Help needed standing up from a chair using your arms (e.g., wheelchair or bedside chair)?: A Little Help needed to walk in hospital room?: A Little Help needed climbing 3-5 steps with a railing? : A Lot 6 Click Score: 17    End of Session Equipment Utilized During Treatment: Gait belt Activity Tolerance: Patient limited by pain Patient left: in bed;with nursing/sitter in room Nurse Communication: Mobility status PT Visit Diagnosis: Unsteadiness on feet (R26.81);Muscle weakness (generalized) (M62.81);Difficulty in walking, not elsewhere classified (R26.2);Pain Pain - Right/Left: Right Pain - part of body: Knee;Ankle and joints of foot     Time: 9052-8996 PT Time Calculation (min) (ACUTE ONLY): 16 min  Charges:    $Therapeutic Activity: 8-22 mins PT General Charges $$ ACUTE PT VISIT: 1 Visit                     James Lambert DPT, PT     James Lambert 10/05/2024, 10:12 AM

## 2024-10-05 NOTE — Progress Notes (Addendum)
 Progress Note    James Lambert  FMW:[id number] DOB: 1966/03/16  DOA: 09/30/2024 PCP: [name], NP      Brief Narrative:    Medical records reviewed and are as summarized below:  James Lambert is a 58 y.o. male with past medical history of alcohol use disorder, hypertension, hyperlipidemia and history of CVA and COPD, who presented to the hospital with redness, swelling and pain of the right leg and right foot.  There was also some redness to the right foot.  Symptoms have progressively worsened and he had difficulty ambulating because of this.  Vitals in the ED: Temperature 98.2 F, respiratory 20, pulse 91, BP 154/82, O2 sat 100% on room air.  Labs: WBC 10.4, hemoglobin 11.0, BUN 34, creatinine 1.28, lactic acid 1.6.  Venous duplex of the right lower extremity was negative for DVT  He was admitted to the hospital for right lower extremity cellulitis.   Assessment/Plan:   Principal Problem:   Cellulitis Active Problems:   History of CVA (cerebrovascular accident)   Hypertension   Mixed hyperlipidemia   Body mass index is 31.99 kg/m.  (Class I obesity)   Right lower extremity cellulitis (leg and foot): MRI of the foot did not show any evidence of osteomyelitis, septic arthritis or abscess.   X-ray of the right great toe showed fracture of the proximal phalanx of great toe, likely subacute with some peripheral callus formation. He was evaluated by Dr. Ashley, podiatrist, on 10/04/2024.  He recommended conservative management and postop shoe for ambulation. Continue IV cefazolin  for total of 7 days (through 10/06/2024) with plan to transition to oral antibiotics to complete 10 days of antibiotics.   Hypertension: Continue amlodipine , irbesartan , HCTZ and metoprolol .   Alcohol use disorder: Ativan  as needed per CIWA protocol.  Continue thiamine  and folate supplement.  He said his last drink was about 3 days prior to admission.   General weakness,  ambulatory dysfunction, s/p mechanical fall at home (about 5 days prior to admission): PT recommended discharge to SNF.   COPD: Stable.  Continue bronchodilators.   History of stroke: Continue Plavix  and Lipitor    Suspect patient also has peripheral neuropathy. He also has right knee osteoarthritis which he describes as bone-on-bone.  Outpatient follow-up with orthopedic surgeon.   Diet Order             Diet Heart Room service appropriate? Yes; Fluid consistency: Thin  Diet effective now                                  Consultants: Podiatrist  Procedures: None    Medications:    amLODipine   10 mg Oral Daily   atorvastatin   80 mg Oral Daily   clopidogrel   75 mg Oral Daily   cyanocobalamin   1,000 mcg Oral Daily   enoxaparin  (LOVENOX ) injection  0.5 mg/kg Subcutaneous Q24H   folic acid   1 mg Oral Daily   irbesartan   300 mg Oral Daily   And   hydrochlorothiazide   12.5 mg Oral Daily   metoprolol  succinate  50 mg Oral Daily   multivitamin with minerals  1 tablet Oral Daily   nicotine   21 mg Transdermal Daily   pantoprazole   40 mg Oral QHS   polyethylene glycol  17 g Oral Daily   thiamine   100 mg Oral Daily   Or   thiamine   100 mg Intravenous Daily  traZODone   50 mg Oral QHS   Continuous Infusions:   ceFAZolin  (ANCEF ) IV 2 g (10/05/24 0835)     Anti-infectives (From admission, onward)    Start     Dose/Rate Route Frequency Ordered Stop   10/01/24 0200  cefTRIAXone  (ROCEPHIN ) 1 g in sodium chloride  0.9 % 100 mL IVPB  Status:  Discontinued        1 g 200 mL/hr over 30 Minutes Intravenous Every 24 hours 09/30/24 0326 09/30/24 0819   09/30/24 0900  ceFAZolin  (ANCEF ) IVPB 2g/100 mL premix        2 g 200 mL/hr over 30 Minutes Intravenous Every 8 hours 09/30/24 0810 10/07/24 0859   09/30/24 0615  vancomycin  (VANCOCIN ) IVPB 1000 mg/200 mL premix  Status:  Discontinued       Placed in Followed by Linked Group   1,000 mg 200 mL/hr over 60  Minutes Intravenous  Once 09/30/24 0529 10/01/24 0859   09/30/24 0615  vancomycin  (VANCOREADY) IVPB 1500 mg/300 mL  Status:  Discontinued       Placed in Followed by Linked Group   1,500 mg 150 mL/hr over 120 Minutes Intravenous  Once 09/30/24 0529 10/01/24 0859   09/30/24 0130  cefTRIAXone  (ROCEPHIN ) 2 g in sodium chloride  0.9 % 100 mL IVPB        2 g 200 mL/hr over 30 Minutes Intravenous  Once 09/30/24 0116 09/30/24 0228   09/30/24 0130  vancomycin  (VANCOCIN ) IVPB 1000 mg/200 mL premix  Status:  Discontinued       Placed in Followed by Linked Group   1,000 mg 200 mL/hr over 60 Minutes Intravenous  Once 09/30/24 0128 09/30/24 0530   09/30/24 0130  vancomycin  (VANCOREADY) IVPB 1500 mg/300 mL  Status:  Discontinued       Placed in Followed by Linked Group   1,500 mg 150 mL/hr over 120 Minutes Intravenous  Once 09/30/24 0128 09/30/24 0530              Family Communication/Anticipated D/C date and plan/Code Status   DVT prophylaxis:      Code Status: Full Code  Family Communication: None Disposition Plan: Plan to discharge to SNF   Status is: Inpatient Remains inpatient appropriate because: Right lower extremity cellulitis        Subjective:   Interval events noted.  No new complaints.  Pain in the right foot is slowly getting better.  He still has swelling of the right right foot  Objective:    Vitals:   10/05/24 0000 10/05/24 0448 10/05/24 0600 10/05/24 0730  BP: 122/76 110/62 115/72 138/88  Pulse: 71 61 70 62  Resp:  18  18  Temp:  97.8 F (36.6 C)  98 F (36.7 C)  TempSrc:  Oral  Oral  SpO2:  99%  97%  Weight:      Height:       No data found.   Intake/Output Summary (Last 24 hours) at 10/05/2024 1059 Last data filed at 10/05/2024 1013 Gross per 24 hour  Intake 700 ml  Output 2030 ml  Net -1330 ml   Filed Weights   09/29/24 2352 09/30/24 0001  Weight: 113.4 kg 113 kg    Exam:  GEN: NAD, sitting up eating breakfast in  bed SKIN: Warm and dry EYES: No pallor or icterus ENT: MMM CV: RRR PULM: CTA B ABD: soft, obese, NT, +BS CNS: AAO x 3, non focal EXT: Persistent swelling of right foot especially on the dorsal aspect.  Tenderness  of right foot is improving.  No erythema       Data Reviewed:   I have personally reviewed following labs and imaging studies:  Labs: Labs show the following:   Basic Metabolic Panel: Recent Labs  Lab 09/30/24 0141 10/01/24 1250  NA 135 135  K 4.0 4.4  CL 100 104  CO2 23 25  GLUCOSE 151* 155*  BUN 34* 22*  CREATININE 1.28* 1.20  CALCIUM  8.7* 8.3*  MG  --  2.0  PHOS  --  3.2   GFR Estimated Creatinine Clearance: 90.8 mL/min (by C-G formula based on SCr of 1.2 mg/dL). Liver Function Tests: Recent Labs  Lab 09/30/24 0141 10/01/24 1250  AST 21  --   ALT 13  --   ALKPHOS 63  --   BILITOT 0.3  --   PROT 8.2*  --   ALBUMIN 3.5 3.0*   No results for input(s): LIPASE, AMYLASE in the last 168 hours. No results for input(s): AMMONIA in the last 168 hours. Coagulation profile No results for input(s): INR, PROTIME in the last 168 hours.  CBC: Recent Labs  Lab 09/30/24 0141  WBC 10.4  NEUTROABS 7.5  HGB 11.0*  HCT 34.1*  MCV 94.5  PLT 383   Cardiac Enzymes: No results for input(s): CKTOTAL, CKMB, CKMBINDEX, TROPONINI in the last 168 hours. BNP (last 3 results) No results for input(s): PROBNP in the last 8760 hours. CBG: No results for input(s): GLUCAP in the last 168 hours. D-Dimer: No results for input(s): DDIMER in the last 72 hours. Hgb A1c: No results for input(s): HGBA1C in the last 72 hours. Lipid Profile: No results for input(s): CHOL, HDL, LDLCALC, TRIG, CHOLHDL, LDLDIRECT in the last 72 hours. Thyroid function studies: No results for input(s): TSH, T4TOTAL, T3FREE, THYROIDAB in the last 72 hours.  Invalid input(s): FREET3 Anemia work up: No results for input(s): VITAMINB12,  FOLATE, FERRITIN, TIBC, IRON, RETICCTPCT in the last 72 hours. Sepsis Labs: Recent Labs  Lab 09/30/24 0141  WBC 10.4  LATICACIDVEN 1.6    Microbiology Recent Results (from the past 240 hours)  Blood culture (routine x 2)     Status: None   Collection Time: 09/30/24  1:41 AM   Specimen: BLOOD  Result Value Ref Range Status   Specimen Description BLOOD BLOOD LEFT ARM  Final   Special Requests   Final    BOTTLES DRAWN AEROBIC AND ANAEROBIC Blood Culture adequate volume   Culture   Final    NO GROWTH 5 DAYS Performed at [facility], [address] [place], [place] [address]    Report Status 10/05/2024 FINAL  Final  Blood culture (routine x 2)     Status: None   Collection Time: 09/30/24  2:31 AM   Specimen: BLOOD  Result Value Ref Range Status   Specimen Description BLOOD BLOOD RIGHT ARM  Final   Special Requests   Final    BOTTLES DRAWN AEROBIC AND ANAEROBIC Blood Culture results may not be optimal due to an inadequate volume of blood received in culture bottles   Culture   Final    NO GROWTH 5 DAYS Performed at [facility], [address] [place], [place] [address]    Report Status 10/05/2024 FINAL  Final  MRSA Next Gen by PCR, Nasal     Status: None   Collection Time: 10/01/24 11:16 AM   Specimen: Nasal Mucosa; Nasal Swab  Result Value Ref Range Status   MRSA by PCR Next Gen  NOT DETECTED NOT DETECTED Final    Comment: (NOTE) The GeneXpert MRSA Assay (FDA approved for NASAL specimens only), is one component of a comprehensive MRSA colonization surveillance program. It is not intended to diagnose MRSA infection nor to guide or monitor treatment for MRSA infections. Test performance is not FDA approved in patients less than 44 years old. Performed at [facility], [address] [place], [place] [address]     Procedures and diagnostic studies:  DG Toe Great Right Result Date: 10/03/2024 CLINICAL DATA:  Fracture of right  great toe. EXAM: RIGHT GREAT TOE COMPARISON:  Foot MRI yesterday FINDINGS: Fracture of the great toe proximal phalanx is likely subacute with some peripheral callus formation. No convincing intra-articular involvement on the current exam. 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 IMPRESSION: Fracture of the great toe proximal phalanx is likely subacute with some peripheral callus formation. No convincing intra-articular involvement on the current exam. Electronically Signed   By: Andrea Gasman M.D.   On: 10/03/2024 15:38                LOS: 3 days   Ahmiyah Coil  [facility]   Pager on [email or web address]. If 7PM-7AM, please contact night-coverage at [email or web address]     10/05/2024, 10:59 AM

## 2024-10-06 DIAGNOSIS — L03115 Cellulitis of right lower limb: Secondary | ICD-10-CM | POA: Diagnosis not present

## 2024-10-06 MED ORDER — CEFADROXIL 500 MG PO CAPS
500.0000 mg | ORAL_CAPSULE | Freq: Two times a day (BID) | ORAL | Status: DC
  Administered 2024-10-07: 500 mg via ORAL
  Filled 2024-10-06 (×2): qty 1

## 2024-10-06 NOTE — Plan of Care (Signed)

## 2024-10-06 NOTE — Progress Notes (Signed)
 Progress Note    James Lambert  FMW:[id number] DOB: [birth year]  DOA: 09/30/2024 PCP: [name], NP      Brief Narrative:    Medical records reviewed and are as summarized below:  James Lambert is a 58 y.o. male with past medical history of alcohol use disorder, hypertension, hyperlipidemia and history of CVA and COPD, who presented to the hospital with redness, swelling and pain of the right leg and right foot.  There was also some redness to the right foot.  Symptoms have progressively worsened and he had difficulty ambulating because of this.  Vitals in the ED: Temperature 98.2 F, respiratory 20, pulse 91, BP 154/82, O2 sat 100% on room air.  Labs: WBC 10.4, hemoglobin 11.0, BUN 34, creatinine 1.28, lactic acid 1.6.  Venous duplex of the right lower extremity was negative for DVT  He was admitted to the hospital for right lower extremity cellulitis.   Assessment/Plan:   Principal Problem:   Cellulitis Active Problems:   History of CVA (cerebrovascular accident)   Hypertension   Mixed hyperlipidemia   Body mass index is 31.99 kg/m.  (Class I obesity)   Right lower extremity cellulitis (leg and foot): MRI of the foot did not show any evidence of osteomyelitis, septic arthritis or abscess.   X-ray of the right great toe showed fracture of the proximal phalanx of great toe, likely subacute with some peripheral callus formation. He was evaluated by Dr. Ashley, podiatrist, on 10/04/2024.  He recommended conservative management and postop shoe for ambulation. Plan to complete 7 days of IV cefazolin  on 10/06/2024. Start cefadroxil on 10/07/2024 and continue for 3 more days   Hypertension: Continue amlodipine , irbesartan , HCTZ and metoprolol .   Alcohol use disorder: Ativan  as needed per CIWA protocol.  Continue thiamine  and folate supplement.  He said his last drink was about 3 days prior to admission.   General weakness, ambulatory dysfunction, s/p  mechanical fall at home (about 5 days prior to admission): PT recommended discharge to SNF.   COPD: Stable.  Continue bronchodilators.   History of stroke: Continue Plavix  and Lipitor    Suspect patient also has peripheral neuropathy. He also has right knee osteoarthritis which he describes as bone-on-bone.  Outpatient follow-up with orthopedic surgeon.   He is medically stable for discharge.  Awaiting placement to SNF.  Insurance authorization is pending.  Diet Order             Diet Heart Room service appropriate? Yes; Fluid consistency: Thin  Diet effective now                                  Consultants: Podiatrist  Procedures: None    Medications:    amLODipine   10 mg Oral Daily   atorvastatin   80 mg Oral Daily   [START ON 10/07/2024] cefadroxil  500 mg Oral BID   clopidogrel   75 mg Oral Daily   cyanocobalamin   1,000 mcg Oral Daily   enoxaparin  (LOVENOX ) injection  0.5 mg/kg Subcutaneous Q24H   folic acid   1 mg Oral Daily   irbesartan   300 mg Oral Daily   And   hydrochlorothiazide   12.5 mg Oral Daily   metoprolol  succinate  50 mg Oral Daily   multivitamin with minerals  1 tablet Oral Daily   nicotine   21 mg Transdermal Daily   pantoprazole   40 mg Oral QHS   polyethylene glycol  17 g Oral Daily   thiamine   100 mg Oral Daily   Or   thiamine   100 mg Intravenous Daily   traZODone   50 mg Oral QHS   Continuous Infusions:   ceFAZolin  (ANCEF ) IV 2 g (10/06/24 0848)     Anti-infectives (From admission, onward)    Start     Dose/Rate Route Frequency Ordered Stop   10/07/24 1000  cefadroxil (DURICEF) capsule 500 mg        500 mg Oral 2 times daily 10/06/24 1319 10/10/24 0959   10/01/24 0200  cefTRIAXone  (ROCEPHIN ) 1 g in sodium chloride  0.9 % 100 mL IVPB  Status:  Discontinued        1 g 200 mL/hr over 30 Minutes Intravenous Every 24 hours 09/30/24 0326 09/30/24 0819   09/30/24 0900  ceFAZolin  (ANCEF ) IVPB 2g/100 mL premix        2  g 200 mL/hr over 30 Minutes Intravenous Every 8 hours 09/30/24 0810 10/07/24 0859   09/30/24 0615  vancomycin  (VANCOCIN ) IVPB 1000 mg/200 mL premix  Status:  Discontinued       Placed in Followed by Linked Group   1,000 mg 200 mL/hr over 60 Minutes Intravenous  Once 09/30/24 0529 10/01/24 0859   09/30/24 0615  vancomycin  (VANCOREADY) IVPB 1500 mg/300 mL  Status:  Discontinued       Placed in Followed by Linked Group   1,500 mg 150 mL/hr over 120 Minutes Intravenous  Once 09/30/24 0529 10/01/24 0859   09/30/24 0130  cefTRIAXone  (ROCEPHIN ) 2 g in sodium chloride  0.9 % 100 mL IVPB        2 g 200 mL/hr over 30 Minutes Intravenous  Once 09/30/24 0116 09/30/24 0228   09/30/24 0130  vancomycin  (VANCOCIN ) IVPB 1000 mg/200 mL premix  Status:  Discontinued       Placed in Followed by Linked Group   1,000 mg 200 mL/hr over 60 Minutes Intravenous  Once 09/30/24 0128 09/30/24 0530   09/30/24 0130  vancomycin  (VANCOREADY) IVPB 1500 mg/300 mL  Status:  Discontinued       Placed in Followed by Linked Group   1,500 mg 150 mL/hr over 120 Minutes Intravenous  Once 09/30/24 0128 09/30/24 0530              Family Communication/Anticipated D/C date and plan/Code Status   DVT prophylaxis:      Code Status: Full Code  Family Communication: None Disposition Plan: Plan to discharge to SNF   Status is: Inpatient Remains inpatient appropriate because: Right lower extremity cellulitis        Subjective:   Interval events noted.  He feels better and better each day.  Pain in the right foot is better but foot is still swollen.  No other complaints.  Objective:    Vitals:   10/05/24 1646 10/05/24 2050 10/06/24 0515 10/06/24 0742  BP: 117/75 (!) 117/94 119/78 (!) 149/97  Pulse: 67 64 63 63  Resp: 17 18 19 16   Temp: 98.6 F (37 C) 97.8 F (36.6 C) 97.7 F (36.5 C) 97.9 F (36.6 C)  TempSrc:  Oral Oral Oral  SpO2: 100% 97% 96% 100%  Weight:      Height:       No data  found.   Intake/Output Summary (Last 24 hours) at 10/06/2024 1420 Last data filed at 10/06/2024 1400 Gross per 24 hour  Intake 1619.43 ml  Output 3100 ml  Net -1480.57 ml   Filed Weights   09/29/24 2352  09/30/24 0001  Weight: 113.4 kg 113 kg    Exam:  GEN: NAD SKIN: Warm and dry EYES: Anicteric ENT: MMM CV: RRR PULM: CTA B ABD: soft, ND, NT, +BS CNS: AAO x 3, non focal EXT: Swelling over the dorsal aspect of the right foot.  Tenderness of the right foot has improved though still slightly tender.  No erythema        Data Reviewed:   I have personally reviewed following labs and imaging studies:  Labs: Labs show the following:   Basic Metabolic Panel: Recent Labs  Lab 09/30/24 0141 10/01/24 1250 10/05/24 1153  NA 135 135 135  K 4.0 4.4 4.2  CL 100 104 97*  CO2 23 25 28   GLUCOSE 151* 155* 148*  BUN 34* 22* 20  CREATININE 1.28* 1.20 1.13  CALCIUM  8.7* 8.3* 8.4*  MG  --  2.0  --   PHOS  --  3.2  --    GFR Estimated Creatinine Clearance: 96.4 mL/min (by C-G formula based on SCr of 1.13 mg/dL). Liver Function Tests: Recent Labs  Lab 09/30/24 0141 10/01/24 1250  AST 21  --   ALT 13  --   ALKPHOS 63  --   BILITOT 0.3  --   PROT 8.2*  --   ALBUMIN 3.5 3.0*   No results for input(s): LIPASE, AMYLASE in the last 168 hours. No results for input(s): AMMONIA in the last 168 hours. Coagulation profile No results for input(s): INR, PROTIME in the last 168 hours.  CBC: Recent Labs  Lab 09/30/24 0141 10/05/24 1153  WBC 10.4 5.5  NEUTROABS 7.5  --   HGB 11.0* 10.2*  HCT 34.1* 31.0*  MCV 94.5 93.4  PLT 383 367   Cardiac Enzymes: No results for input(s): CKTOTAL, CKMB, CKMBINDEX, TROPONINI in the last 168 hours. BNP (last 3 results) No results for input(s): PROBNP in the last 8760 hours. CBG: No results for input(s): GLUCAP in the last 168 hours. D-Dimer: No results for input(s): DDIMER in the last 72 hours. Hgb A1c: No  results for input(s): HGBA1C in the last 72 hours. Lipid Profile: No results for input(s): CHOL, HDL, LDLCALC, TRIG, CHOLHDL, LDLDIRECT in the last 72 hours. Thyroid function studies: No results for input(s): TSH, T4TOTAL, T3FREE, THYROIDAB in the last 72 hours.  Invalid input(s): FREET3 Anemia work up: No results for input(s): VITAMINB12, FOLATE, FERRITIN, TIBC, IRON, RETICCTPCT in the last 72 hours. Sepsis Labs: Recent Labs  Lab 09/30/24 0141 10/05/24 1153  WBC 10.4 5.5  LATICACIDVEN 1.6  --     Microbiology Recent Results (from the past 240 hours)  Blood culture (routine x 2)     Status: None   Collection Time: 09/30/24  1:41 AM   Specimen: BLOOD  Result Value Ref Range Status   Specimen Description BLOOD BLOOD LEFT ARM  Final   Special Requests   Final    BOTTLES DRAWN AEROBIC AND ANAEROBIC Blood Culture adequate volume   Culture   Final    NO GROWTH 5 DAYS Performed at [facility], [address], [place], [place] [address]    Report Status 10/05/2024 FINAL  Final  Blood culture (routine x 2)     Status: None   Collection Time: 09/30/24  2:31 AM   Specimen: BLOOD  Result Value Ref Range Status   Specimen Description BLOOD BLOOD RIGHT ARM  Final   Special Requests   Final    BOTTLES DRAWN AEROBIC AND ANAEROBIC Blood Culture results  may not be optimal due to an inadequate volume of blood received in culture bottles   Culture   Final    NO GROWTH 5 DAYS Performed at [facility], [address] [place], [place] [address]    Report Status 10/05/2024 FINAL  Final  MRSA Next Gen by PCR, Nasal     Status: None   Collection Time: 10/01/24 11:16 AM   Specimen: Nasal Mucosa; Nasal Swab  Result Value Ref Range Status   MRSA by PCR Next Gen NOT DETECTED NOT DETECTED Final    Comment: (NOTE) The GeneXpert MRSA Assay (FDA approved for NASAL specimens only), is one component of a comprehensive MRSA colonization  surveillance program. It is not intended to diagnose MRSA infection nor to guide or monitor treatment for MRSA infections. Test performance is not FDA approved in patients less than 30 years old. Performed at [facility], [address] [place], [place] [address]     Procedures and diagnostic studies:  No results found.               LOS: 4 days   Sheilia Reznick  [facility]   Pager on [email or web address]. If 7PM-7AM, please contact night-coverage at [email or web address]     10/06/2024, 2:20 PM

## 2024-10-06 NOTE — Progress Notes (Signed)
 PT Cancellation Note  Patient Details Name: James Lambert MRN: [id number] DOB: 08-May-1966   Cancelled Treatment:    Reason Eval/Treat Not Completed: Patient declined, no reason specified Pt states that he just got back in bed and is tried from bathing and performing ADLs. Pt further declined PT after encouragement. PT to revisit to encouragement for mobility.      [name] 10/06/2024, 3:10 PM

## 2024-10-06 NOTE — Plan of Care (Signed)

## 2024-10-06 NOTE — Progress Notes (Signed)
 Mobility Specialist - Progress Note    10/06/24 1700  Mobility  Activity Stood at bedside;Dangled on edge of bed  Level of Assistance Standby assist, set-up cues, supervision of patient - no hands on  Assistive Device Front wheel walker  Distance Ambulated (ft) 2 ft  Range of Motion/Exercises All extremities  Activity Response Tolerated fair  Mobility visit 1 Mobility  Mobility Specialist Start Time (ACUTE ONLY) 1609  Mobility Specialist Stop Time (ACUTE ONLY) 1634  Mobility Specialist Time Calculation (min) (ACUTE ONLY) 25 min   Pt was supine in bed on RA upon entry. Pt agreed to mobility. Pt has R LE pain. Pt was able to get to the EOB independently and dangle both legs off the edge of the bed. Pt is able with a 2 WW to STS delicately. Pt did state that pain was present. Pt was able to take a step. After activity pt returned back to bed with needs in reach.  Clem Rodes Mobility Specialist 10/06/24, 5:45 PM

## 2024-10-07 ENCOUNTER — Other Ambulatory Visit: Payer: Self-pay

## 2024-10-07 DIAGNOSIS — E782 Mixed hyperlipidemia: Secondary | ICD-10-CM

## 2024-10-07 DIAGNOSIS — Z8673 Personal history of transient ischemic attack (TIA), and cerebral infarction without residual deficits: Secondary | ICD-10-CM | POA: Diagnosis not present

## 2024-10-07 DIAGNOSIS — I10 Essential (primary) hypertension: Secondary | ICD-10-CM

## 2024-10-07 DIAGNOSIS — L03115 Cellulitis of right lower limb: Secondary | ICD-10-CM | POA: Diagnosis not present

## 2024-10-07 LAB — GLUCOSE, CAPILLARY: Glucose-Capillary: 124 mg/dL — ABNORMAL HIGH (ref 70–99)

## 2024-10-07 LAB — CREATININE, SERUM
Creatinine, Ser: 1.27 mg/dL — ABNORMAL HIGH (ref 0.61–1.24)
GFR, Estimated: >60 mL/min (ref >60)

## 2024-10-07 MED ORDER — NICOTINE 21 MG/24HR TD PT24
21.0000 mg | MEDICATED_PATCH | Freq: Every day | TRANSDERMAL | 0 refills | Status: AC
  Filled 2024-10-07: qty 28, 28d supply, fill #0

## 2024-10-07 MED ORDER — FOLIC ACID 1 MG PO TABS
1.0000 mg | ORAL_TABLET | Freq: Every day | ORAL | 0 refills | Status: AC
  Filled 2024-10-07: qty 30, 30d supply, fill #0

## 2024-10-07 MED ORDER — CEFADROXIL 500 MG PO CAPS
500.0000 mg | ORAL_CAPSULE | Freq: Two times a day (BID) | ORAL | 0 refills | Status: AC
  Filled 2024-10-07: qty 6, 3d supply, fill #0

## 2024-10-07 MED ORDER — ACETAMINOPHEN 500 MG PO TABS
500.0000 mg | ORAL_TABLET | Freq: Four times a day (QID) | ORAL | 0 refills | Status: AC | PRN
  Filled 2024-10-07: qty 30, 8d supply, fill #0

## 2024-10-07 MED ORDER — POLYETHYLENE GLYCOL 3350 17 GM/SCOOP PO POWD
17.0000 g | Freq: Every day | ORAL | 0 refills | Status: AC
  Filled 2024-10-07: qty 238, 14d supply, fill #0

## 2024-10-07 NOTE — Plan of Care (Signed)

## 2024-10-07 NOTE — Progress Notes (Signed)
 Mobility Specialist - Progress Note   10/07/24 1415  Mobility  Activity Stood at bedside  Level of Assistance Standby assist, set-up cues, supervision of patient - no hands on  Assistive Device Front wheel walker  Activity Response Tolerated well  Mobility visit 1 Mobility  Mobility Specialist Start Time (ACUTE ONLY) 1403  Mobility Specialist Stop Time (ACUTE ONLY) 1414  Mobility Specialist Time Calculation (min) (ACUTE ONLY) 11 min   Pt dangled EOB upon entry, utilizing RA. STS to RW SBA w/ elevated bed height-- Max effort, expressed R knee pain. Pt returned to the EOB, left seated with needs within reach.  America Silvan Mobility Specialist 10/07/24 2:18 PM

## 2024-10-07 NOTE — Discharge Summary (Signed)
 Physician Discharge Summary   Patient: James Lambert MRN: [id number] DOB: November 07, 1966  Admit date:     09/30/2024  Discharge date: 10/07/24  Discharge Physician: Leita Blanch   PCP: [name], NP   Recommendations at discharge:    F/u PCP in 1-2 weeks Abstain from drinking ETOH  Discharge Diagnoses: Principal Problem:   Cellulitis Active Problems:   History of CVA (cerebrovascular accident)   Hypertension   Mixed hyperlipidemia  James Lambert is a 58 y.o. male with past medical history of alcohol use disorder, hypertension, hyperlipidemia and history of CVA and COPD, who presented to the hospital with redness, swelling and pain of the right leg and right foot.  There was also some redness to the right foot.  Symptoms have progressively worsened and he had difficulty ambulating because of this.   Right lower extremity cellulitis (leg and foot):  Proximal phalanx subacute fracture great toe right --MRI of the foot did not show any evidence of osteomyelitis, septic arthritis or abscess.   --X-ray of the right great toe showed fracture of the proximal phalanx of great toe, likely subacute with some peripheral callus formation. --He was evaluated by Dr. Ashley, podiatrist, on 10/04/2024.  He recommended --conservative management and postop shoe for ambulation. --Plan to complete 7 days of IV cefazolin  on 10/06/2024. --Start cefadroxil on 10/07/2024 and continue for 3 more days   Hypertension: Continue amlodipine , irbesartan , HCTZ and metoprolol .    Alcohol use disorder: Ativan  as needed per CIWA protocol.   --Continue thiamine  and folate supplement.   --He said his last drink was about 3 days prior to admission. --no s/s of WD --advised cessation   General weakness, ambulatory dysfunction, s/p mechanical fall at home (about 5 days prior to admission): -- PT recommended discharge to SNF.    COPD: Stable.  -- Continue bronchodilators.   History of stroke: Continue Plavix   and Lipitor   Right knee DJD--f/u PCP and possible referral to orthopedic  Overall stable at baseline. D/c to rehab. Auth obtained. Per pt brother (POC) is aware. Pt agreeable with plan        Pain control - [place]  Controlled Substance Reporting System database was reviewed. and patient was instructed, not to drive, operate heavy machinery, perform activities at heights, swimming or participation in water activities or provide baby-sitting services while on Pain, Sleep and Anxiety Medications; until their outpatient Physician has advised to do so again. Also recommended to not to take more than prescribed Pain, Sleep and Anxiety Medications.  Consultants: podiatry Procedures performed: none  Disposition: Skilled nursing facility Diet recommendation:  Discharge Diet Orders (From admission, onward)     Start     Ordered   10/07/24 0000  Diet - low sodium heart healthy        10/07/24 0941           Cardiac diet DISCHARGE MEDICATION: Allergies as of 10/07/2024   No Known Allergies      Medication List     TAKE these medications    acetaminophen  500 MG tablet Commonly known as: TYLENOL  Take 1 tablet (500 mg total) by mouth every 6 (six) hours as needed for mild pain (pain score 1-3) or moderate pain (pain score 4-6). What changed:  how much to take reasons to take this   albuterol  108 (90 Base) MCG/ACT inhaler Commonly known as: VENTOLIN  HFA Inhale 1-2 puffs into the lungs every 6 (six) hours as needed for wheezing or shortness of breath.  amLODipine  10 MG tablet Commonly known as: NORVASC  Take 1 tablet (10 mg total) by mouth daily.   atorvastatin  80 MG tablet Commonly known as: LIPITOR  Take 1 tablet (80 mg total) by mouth daily.   cefadroxil 500 MG capsule Commonly known as: DURICEF Take 1 capsule (500 mg total) by mouth 2 (two) times daily for 3 days.   CertaVite/Antioxidants Tabs Take 1 tablet by mouth daily.   clopidogrel  75 MG tablet Commonly  known as: PLAVIX  Take 1 tablet (75 mg total) by mouth daily.   cyanocobalamin  1000 MCG tablet Take 1 tablet (1,000 mcg total) by mouth daily.   folic acid  1 MG tablet Commonly known as: FOLVITE  Take 1 tablet (1 mg total) by mouth daily.   meloxicam  15 MG tablet Commonly known as: MOBIC  Take 1 tablet (15 mg total) by mouth daily.   metoprolol  succinate 50 MG 24 hr tablet Commonly known as: TOPROL -XL Take 1 tablet (50 mg total) by mouth daily with or immediately following a meal.   mupirocin  ointment 2 % Commonly known as: BACTROBAN  Apply 1 Application topically 2 (two) times daily.   nicotine  21 mg/24hr patch Commonly known as: NICODERM CQ  - dosed in mg/24 hours Place 1 patch (21 mg total) onto the skin daily.   olmesartan -hydrochlorothiazide  40-12.5 MG tablet Commonly known as: BENICAR  HCT Take 1 tablet by mouth daily.   pantoprazole  40 MG tablet Commonly known as: PROTONIX  Take 1 tablet (40 mg total) by mouth at bedtime.   polyethylene glycol 17 g packet Commonly known as: MIRALAX  / GLYCOLAX  Take 17 g by mouth daily.   thiamine  100 MG tablet Commonly known as: Vitamin B-1 Take 1 tablet (100 mg total) by mouth daily.   tiZANidine  2 MG tablet Commonly known as: ZANAFLEX  Take 1 tablet (2 mg total) by mouth at bedtime. May also take 1 tablet (2 mg total) daily as needed for muscle spasms.   umeclidinium-vilanterol 62.5-25 MCG/ACT Aepb Commonly known as: ANORO ELLIPTA  Inhale 1 puff into the lungs daily at 6 (six) AM.               Discharge Care Instructions  (From admission, onward)           Start     Ordered   10/07/24 0000  Discharge wound care:       Comments: 09/30/24 0836    Wound care  Every shift      Comments: Cleanse buttocks with soap and warm water, pat dry.  Can place a peri-pad in underwear to catch any drainage.  09/30/24 0835   10/07/24 0941            Follow-up Information     [name], NP. Schedule an appointment  as soon as possible for a visit in 1 week(s).   Specialty: Nurse Practitioner Why: hospital f/u Contact information: [address] [place] [address] [phone]                Discharge Exam: James Lambert   09/29/24 2352 09/30/24 0001  Weight: 113.4 kg 113 kg   Alert and Ox3 Resp: CTA CV s1s2 norma; Neuro grossly intact  Condition at discharge: fair  The results of significant diagnostics from this hospitalization (including imaging, microbiology, ancillary and laboratory) are listed below for reference.   Imaging Studies: DG Toe Great Right Result Date: 10/03/2024 CLINICAL DATA:  Fracture of right great toe. EXAM: RIGHT GREAT TOE COMPARISON:  Foot MRI yesterday FINDINGS: Fracture of the great toe  proximal phalanx is likely subacute with some peripheral callus formation. No convincing intra-articular involvement on the current exam. 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 IMPRESSION: Fracture of the great toe proximal phalanx is likely subacute with some peripheral callus formation. No convincing intra-articular involvement on the current exam. Electronically Signed   By: Andrea Gasman M.D.   On: 10/03/2024 15:38   MR FOOT RIGHT W WO CONTRAST Result Date: 10/03/2024 CLINICAL DATA:  Soft tissue infection suspected, foot, xray done Right leg and foot redness, swelling and pain. Soft tissue infection suspected. EXAM: MRI OF THE RIGHT FOREFOOT WITHOUT AND WITH CONTRAST TECHNIQUE: Multiplanar, multisequence MR imaging of the right foot was performed before and after the administration of intravenous contrast. CONTRAST:  10mL GADAVIST  GADOBUTROL  1 MMOL/ML IV SOLN COMPARISON:  Lower extremity venous Doppler ultrasound 09/30/2024. No other recent relevant imaging available. FINDINGS: Technical note: Despite efforts by the technologist and patient, mild to moderate motion artifact is present on today's exam and could not be eliminated. This  reduces exam sensitivity and specificity. Some sequences were repeated. Bones/Joint/Cartilage Examination was performed with a large field of view which includes most of the foot. Best seen on the sagittal images is a probable fracture involving the proximal phalanx of the great toe (image 24/17). This may demonstrate proximal intra-articular extension. There is no other evidence of acute fracture, dislocation or osteomyelitis. There are moderate degenerative changes at the 1st metatarsophalangeal joint. No other significant arthropathic changes are identified. No significant joint effusions or abnormal synovial enhancement. Ligaments Intact Lisfranc ligament. The collateral ligaments of the metatarsophalangeal joints are intact. Muscles and Tendons No focal muscular abnormalities or abnormal enhancement identified. The ankle tendons appear intact without tenosynovitis. The forefoot tendons appear intact. Soft tissues No obvious focal soft tissue ulceration identified. There is moderate subcutaneous edema throughout the forefoot without organized fluid collection or abnormal enhancement. No foreign bodies are identified. IMPRESSION: 1. Probable acute/subacute fracture of the proximal phalanx of the great toe with possible intra-articular extension. This is suboptimally evaluated due to motion. Recommend plain film correlation. 2. No evidence of osteomyelitis or septic arthritis. 3. Nonspecific subcutaneous edema throughout the forefoot without organized fluid collection or abnormal enhancement. 4. Moderate degenerative changes at the 1st metatarsophalangeal joint. Electronically Signed   By: Elsie Perone M.D.   On: 10/03/2024 10:28   US  Venous Img Lower Unilateral Right Result Date: 09/30/2024 CLINICAL DATA:  Right leg pain and swelling EXAM: RIGHT LOWER EXTREMITY VENOUS DOPPLER ULTRASOUND TECHNIQUE: Gray-scale sonography with graded compression, as well as color Doppler and duplex ultrasound were performed  to evaluate the lower extremity deep venous systems from the level of the common femoral vein and including the common femoral, femoral, profunda femoral, popliteal and calf veins including the posterior tibial, peroneal and gastrocnemius veins when visible. The superficial great saphenous vein was also interrogated. Spectral Doppler was utilized to evaluate flow at rest and with distal augmentation maneuvers in the common femoral, femoral and popliteal veins. COMPARISON:  None Available. FINDINGS: Contralateral Common Femoral Vein: Respiratory phasicity is normal and symmetric with the symptomatic side. No evidence of thrombus. Normal compressibility. Common Femoral Vein: No evidence of thrombus. Normal compressibility, respiratory phasicity and response to augmentation. Saphenofemoral Junction: No evidence of thrombus. Normal compressibility and flow on color Doppler imaging. Profunda Femoral Vein: No evidence of thrombus. Normal compressibility and flow on color Doppler imaging. Femoral Vein: No evidence of thrombus. Normal compressibility, respiratory phasicity and response to augmentation. Popliteal Vein: No evidence of  thrombus. Normal compressibility, respiratory phasicity and response to augmentation. Calf Veins: No evidence of thrombus. Normal compressibility and flow on color Doppler imaging. Superficial Great Saphenous Vein: No evidence of thrombus. Normal compressibility. Venous Reflux:  None. Other Findings: Generalized edema in the right lower extremity is noted. IMPRESSION: No evidence of deep venous thrombosis. Electronically Signed   By: Oneil Devonshire M.D.   On: 09/30/2024 02:34   DG Knee 1-2 Views Right Result Date: 09/30/2024 CLINICAL DATA:  Right knee pain for 2 days, no known injury, initial encounter EXAM: RIGHT KNEE - 2 VIEW COMPARISON:  04/24/2023 FINDINGS: Tricompartmental degenerative changes of the right knee are noted. Small joint effusion is seen. No acute fracture or dislocation is  noted. IMPRESSION: Tricompartmental degenerative changes similar to that seen on the prior exam. Electronically Signed   By: Oneil Devonshire M.D.   On: 09/30/2024 01:04    Microbiology: Results for orders placed or performed during the hospital encounter of 09/30/24  Blood culture (routine x 2)     Status: None   Collection Time: 09/30/24  1:41 AM   Specimen: BLOOD  Result Value Ref Range Status   Specimen Description BLOOD BLOOD LEFT ARM  Final   Special Requests   Final    BOTTLES DRAWN AEROBIC AND ANAEROBIC Blood Culture adequate volume   Culture   Final    NO GROWTH 5 DAYS Performed at [facility], [address] [place], [place] [address]    Report Status 10/05/2024 FINAL  Final  Blood culture (routine x 2)     Status: None   Collection Time: 09/30/24  2:31 AM   Specimen: BLOOD  Result Value Ref Range Status   Specimen Description BLOOD BLOOD RIGHT ARM  Final   Special Requests   Final    BOTTLES DRAWN AEROBIC AND ANAEROBIC Blood Culture results may not be optimal due to an inadequate volume of blood received in culture bottles   Culture   Final    NO GROWTH 5 DAYS Performed at [facility], [address] [place], [place] [address]    Report Status 10/05/2024 FINAL  Final  MRSA Next Gen by PCR, Nasal     Status: None   Collection Time: 10/01/24 11:16 AM   Specimen: Nasal Mucosa; Nasal Swab  Result Value Ref Range Status   MRSA by PCR Next Gen NOT DETECTED NOT DETECTED Final    Comment: (NOTE) The GeneXpert MRSA Assay (FDA approved for NASAL specimens only), is one component of a comprehensive MRSA colonization surveillance program. It is not intended to diagnose MRSA infection nor to guide or monitor treatment for MRSA infections. Test performance is not FDA approved in patients less than 36 years old. Performed at [facility], [address] [place], [place] [address]     Labs: CBC: Recent Labs  Lab 10/05/24 1153  WBC 5.5  HGB  10.2*  HCT 31.0*  MCV 93.4  PLT 367   Basic Metabolic Panel: Recent Labs  Lab 10/01/24 1250 10/05/24 1153 10/07/24 0452  NA 135 135  --   K 4.4 4.2  --   CL 104 97*  --   CO2 25 28  --   GLUCOSE 155* 148*  --   BUN 22* 20  --   CREATININE 1.20 1.13 1.27*  CALCIUM  8.3* 8.4*  --   MG 2.0  --   --   PHOS 3.2  --   --    Liver Function Tests: Recent  Labs  Lab 10/01/24 1250  ALBUMIN 3.0*   CBG: Recent Labs  Lab 10/07/24 0803  GLUCAP 124*    Discharge time spent: greater than 30 minutes.  Signed: Leita Blanch, MD [facility] 10/07/2024

## 2024-10-07 NOTE — TOC Transition Note (Addendum)
 Transition of Care [facility]) - Discharge Note   Patient Details  Name: James Lambert MRN: [id number] Date of Birth: 22-Jul-1966  Transition of [facility]) CM/SW Contact:  Alvaro Louder, LCSW Phone Number: 10/07/2024, 11:52 AM   Clinical Narrative:   LCSWA received insurance approval for patient to admit to SNF [facility]. LCSWA confirmed with MD that patient is stable for discharge. LCSWA notified the patient and they are in agreement with discharge. LCSWA confirmed bed is available at SNF. Patient will transport to facility via [employer] taxi.  RM [id number], Number to call report [phone].   TOC signing off  Final next level of care: Skilled Nursing Facility Barriers to Discharge: No Barriers Identified   Patient Goals and CMS Choice            Discharge Placement              Patient chooses bed at:  [facility]) Patient to be transferred to facility by: [facility] Name of family member notified: Self/Dontae Patient and family notified of of transfer: 10/07/24  Discharge Plan and Services Additional resources added to the After Visit Summary for                                       Social Drivers of Health (SDOH) Interventions SDOH Screenings   Food Insecurity: No Food Insecurity (09/30/2024)  Housing: Low Risk  (09/30/2024)  Transportation Needs: No Transportation Needs (09/30/2024)  Utilities: Not At Risk (09/30/2024)  Alcohol Screen: Low Risk  (09/10/2022)  Depression (PHQ2-9): Low Risk  (07/12/2024)  Financial Resource Strain: High Risk (03/09/2023)  Physical Activity: Insufficiently Active (09/10/2022)  Social Connections: Moderately Isolated (09/30/2024)  Stress: Stress Concern Present (08/18/2023)  Tobacco Use: High Risk (09/30/2024)     Readmission Risk Interventions     No data to display

## 2024-12-21 ENCOUNTER — Encounter: Payer: Self-pay | Admitting: Nurse Practitioner

## 2025-01-07 NOTE — Patient Instructions (Incomplete)
 Healthy Eating for Your Heart Many factors influence your heart health, including eating and exercise habits. Heart health is also called coronary health. Coronary risk increases with abnormal blood fat (lipid) levels. A heart-healthy eating plan includes limiting unhealthy fats, increasing healthy fats, limiting salt (sodium) intake, and making other diet and lifestyle changes. What is my plan? Your health care provider may recommend that: You limit your fat intake to _________% or less of your total calories each day. You limit your saturated fat intake to _________% or less of your total calories each day. You limit the amount of cholesterol in your diet to less than _________ mg per day. You limit the amount of sodium in your diet to less than _________ mg per day. What are tips for following this plan? Cooking Cook foods using methods other than frying. Baking, boiling, grilling, and broiling are all good options. Other ways to reduce fat include: Removing the skin from poultry. Removing all visible fats from meats. Steaming vegetables in water or broth. Meal planning  At meals, imagine dividing your plate into fourths: Fill one-half of your plate with vegetables and green salads. Fill one-fourth of your plate with whole grains. Fill one-fourth of your plate with lean protein foods. Eat 2-4 cups of vegetables per day. One cup of vegetables equals 1 cup (91 g) broccoli or cauliflower florets, 2 medium carrots, 1 large bell pepper, 1 large sweet potato, 1 large tomato, 1 medium white potato, 2 cups (150 g) raw leafy greens. Eat 1-2 cups of fruit per day. One cup of fruit equals 1 small apple, 1 large banana, 1 cup (237 g) mixed fruit, 1 large orange,  cup (82 g) dried fruit, 1 cup (240 mL) 100% fruit juice. Eat more foods that contain soluble fiber. Examples include apples, broccoli, carrots, beans, peas, and barley. Aim to get 25-30 g of fiber per day. Increase your consumption of  legumes, nuts, and seeds to 4-5 servings per week. One serving of dried beans or legumes equals  cup (90 g) cooked, 1 serving of nuts is  oz (12 almonds, 24 pistachios, or 7 walnut halves), and 1 serving of seeds equals  oz (8 g). Fats Choose healthy fats more often. Choose monounsaturated and polyunsaturated fats, such as olive and canola oils, avocado oil, flaxseeds, walnuts, almonds, and seeds. Eat more omega-3 fats. Choose salmon, mackerel, sardines, tuna, flaxseed oil, and ground flaxseeds. Aim to eat fish at least 2 times each week. Check food labels carefully to identify foods with trans fats or high amounts of saturated fat. Limit saturated fats. These are found in animal products, such as meats, butter, and cream. Plant sources of saturated fats include palm oil, palm kernel oil, and coconut oil. Avoid foods with partially hydrogenated oils in them. These contain trans fats. Examples are stick margarine, some tub margarines, cookies, crackers, and other baked goods. Avoid fried foods. General information Eat more home-cooked food and less restaurant, buffet, and fast food. Limit or avoid alcohol. Limit foods that are high in added sugar and simple starches such as foods made using white refined flour (white breads, pastries, sweets). Lose weight if you are overweight. Losing just 5-10% of your body weight can help your overall health and prevent diseases such as diabetes and heart disease. Monitor your sodium intake, especially if you have high blood pressure. Talk with your health care provider about your sodium intake. Try to incorporate more vegetarian meals weekly. What foods should I eat? Fruits All fresh, canned (  in natural juice), or frozen fruits. Vegetables Fresh or frozen vegetables (raw, steamed, roasted, or grilled). Green salads. Grains Most grains. Choose whole wheat and whole grains most of the time. Rice and pasta, including brown rice and pastas made with whole  wheat. Meats and other proteins Lean, well-trimmed beef, veal, pork, and lamb. Chicken and turkey without skin. All fish and shellfish. Wild duck, rabbit, pheasant, and venison. Egg whites or low-cholesterol egg substitutes. Dried beans, peas, lentils, and tofu. Seeds and most nuts. Dairy Low-fat or nonfat cheeses, including ricotta and mozzarella. Skim or 1% milk (liquid, powdered, or evaporated). Buttermilk made with low-fat milk. Nonfat or low-fat yogurt. Fats and oils Non-hydrogenated (trans-free) margarines. Vegetable oils, including soybean, sesame, sunflower, olive, avocado, peanut, safflower, corn, canola, and cottonseed. Salad dressings or mayonnaise made with a vegetable oil. Beverages Water (mineral or sparkling). Coffee and tea. Unsweetened ice tea. Diet beverages. Sweets and desserts Sherbet, gelatin, and fruit ice. Small amounts of dark chocolate. Limit all sweets and desserts. Seasonings and condiments All seasonings and condiments. The items listed above may not be a complete list of foods and beverages you can eat. Contact a dietitian for more options. What foods should I avoid? Fruits Canned fruit in heavy syrup. Fruit in cream or butter sauce. Fried fruit. Limit coconut. Vegetables Vegetables cooked in cheese, cream, or butter sauce. Fried vegetables. Grains Breads made with saturated or trans fats, oils, or whole milk. Croissants. Sweet rolls. Donuts. High-fat crackers, such as cheese crackers and chips. Meats and other proteins Fatty meats, such as hot dogs, ribs, sausage, bacon, rib-eye roast or steak. High-fat deli meats, such as salami and bologna. Caviar. Domestic duck and goose. Organ meats, such as liver. Dairy Cream, sour cream, cream cheese, and creamed cottage cheese. Whole-milk cheeses. Whole or 2% milk (liquid, evaporated, or condensed). Whole buttermilk. Cream sauce or high-fat cheese sauce. Whole-milk yogurt. Fats and oils Meat fat, or shortening. Cocoa  butter, hydrogenated oils, palm oil, coconut oil, palm kernel oil. Solid fats and shortenings, including bacon fat, salt pork, lard, and butter. Nondairy cream substitutes. Salad dressings with cheese or sour cream. Beverages Regular sodas and any drinks with added sugar. Sweets and desserts Frosting. Pudding. Cookies. Cakes. Pies. Milk chocolate or white chocolate. Buttered syrups. Full-fat ice cream or ice cream drinks. The items listed above may not be a complete list of foods and beverages to avoid. Contact a dietitian for more information. Summary Heart-healthy meal planning includes limiting unhealthy fats, increasing healthy fats, limiting salt (sodium) intake and making other diet and lifestyle changes. Lose weight if you are overweight. Losing just 5-10% of your body weight can help your overall health and prevent diseases such as diabetes and heart disease. Focus on eating a balance of foods, including fruits and vegetables, low-fat or nonfat dairy, lean protein, nuts and legumes, whole grains, and heart-healthy oils and fats. This information is not intended to replace advice given to you by your health care provider. Make sure you discuss any questions you have with your health care provider. Document Revised: 10/03/2024 Document Reviewed: 12/30/2021 Elsevier Patient Education  2025 [employer].

## 2025-01-11 ENCOUNTER — Ambulatory Visit: Admitting: Nurse Practitioner

## 2025-01-11 DIAGNOSIS — J432 Centrilobular emphysema: Secondary | ICD-10-CM

## 2025-01-11 DIAGNOSIS — E669 Obesity, unspecified: Secondary | ICD-10-CM

## 2025-01-11 DIAGNOSIS — E782 Mixed hyperlipidemia: Secondary | ICD-10-CM

## 2025-01-11 DIAGNOSIS — F17210 Nicotine dependence, cigarettes, uncomplicated: Secondary | ICD-10-CM

## 2025-01-11 DIAGNOSIS — I10 Essential (primary) hypertension: Secondary | ICD-10-CM

## 2025-01-11 DIAGNOSIS — L732 Hidradenitis suppurativa: Secondary | ICD-10-CM

## 2025-01-11 DIAGNOSIS — R569 Unspecified convulsions: Secondary | ICD-10-CM

## 2025-01-11 DIAGNOSIS — Z23 Encounter for immunization: Secondary | ICD-10-CM

## 2025-01-11 DIAGNOSIS — R7309 Other abnormal glucose: Secondary | ICD-10-CM

## 2025-01-11 DIAGNOSIS — F1010 Alcohol abuse, uncomplicated: Secondary | ICD-10-CM

## 2025-01-11 DIAGNOSIS — Z8673 Personal history of transient ischemic attack (TIA), and cerebral infarction without residual deficits: Secondary | ICD-10-CM

## 2025-01-11 DIAGNOSIS — I69351 Hemiplegia and hemiparesis following cerebral infarction affecting right dominant side: Secondary | ICD-10-CM

## 2025-01-13 ENCOUNTER — Ambulatory Visit: Admitting: Nurse Practitioner
